# Patient Record
Sex: Female | Born: 1945 | Race: Black or African American | Hispanic: No | Smoking: Former smoker
Health system: Southern US, Community
[De-identification: ages and names within clinical notes are randomized; demographics above are authoritative.]

## PROBLEM LIST (undated history)

## (undated) DIAGNOSIS — I5042 Chronic combined systolic (congestive) and diastolic (congestive) heart failure: Secondary | ICD-10-CM

## (undated) DIAGNOSIS — C50919 Malignant neoplasm of unspecified site of unspecified female breast: Secondary | ICD-10-CM

## (undated) DIAGNOSIS — I5032 Chronic diastolic (congestive) heart failure: Secondary | ICD-10-CM

## (undated) DIAGNOSIS — I428 Other cardiomyopathies: Secondary | ICD-10-CM

## (undated) DIAGNOSIS — I2510 Atherosclerotic heart disease of native coronary artery without angina pectoris: Secondary | ICD-10-CM

## (undated) HISTORY — DX: Chronic diastolic (congestive) heart failure: I50.32

## (undated) HISTORY — PX: TRANSTHORACIC ECHOCARDIOGRAM: SHX275

## (undated) HISTORY — DX: Malignant neoplasm of unspecified site of unspecified female breast: C50.919

## (undated) HISTORY — DX: Atherosclerotic heart disease of native coronary artery without angina pectoris: I25.10

## (undated) HISTORY — DX: Other cardiomyopathies: I42.8

## (undated) HISTORY — PX: APPENDECTOMY: SHX54

## (undated) HISTORY — PX: BREAST LUMPECTOMY: SHX2

## (undated) HISTORY — DX: Chronic combined systolic (congestive) and diastolic (congestive) heart failure: I50.42

---

## 1997-06-01 ENCOUNTER — Encounter: Admission: RE | Admit: 1997-06-01 | Discharge: 1997-08-30 | Payer: Self-pay | Admitting: Cardiology

## 2000-03-05 ENCOUNTER — Encounter: Payer: Self-pay | Admitting: Cardiology

## 2000-03-05 ENCOUNTER — Encounter: Admission: RE | Admit: 2000-03-05 | Discharge: 2000-03-05 | Payer: Self-pay | Admitting: Cardiology

## 2000-03-06 ENCOUNTER — Encounter: Payer: Self-pay | Admitting: Cardiology

## 2000-03-06 ENCOUNTER — Encounter: Admission: RE | Admit: 2000-03-06 | Discharge: 2000-03-06 | Payer: Self-pay | Admitting: Cardiology

## 2000-03-07 ENCOUNTER — Other Ambulatory Visit: Admission: RE | Admit: 2000-03-07 | Discharge: 2000-03-07 | Payer: Self-pay | Admitting: General Surgery

## 2000-03-15 ENCOUNTER — Encounter: Admission: RE | Admit: 2000-03-15 | Discharge: 2000-03-15 | Payer: Self-pay | Admitting: General Surgery

## 2000-03-15 ENCOUNTER — Other Ambulatory Visit: Admission: RE | Admit: 2000-03-15 | Discharge: 2000-03-15 | Payer: Self-pay | Admitting: General Surgery

## 2000-03-15 ENCOUNTER — Encounter (HOSPITAL_BASED_OUTPATIENT_CLINIC_OR_DEPARTMENT_OTHER): Payer: Self-pay | Admitting: General Surgery

## 2000-03-29 ENCOUNTER — Ambulatory Visit (HOSPITAL_COMMUNITY): Admission: RE | Admit: 2000-03-29 | Discharge: 2000-03-29 | Payer: Self-pay | Admitting: General Surgery

## 2000-03-29 ENCOUNTER — Encounter (HOSPITAL_BASED_OUTPATIENT_CLINIC_OR_DEPARTMENT_OTHER): Payer: Self-pay | Admitting: General Surgery

## 2000-03-29 ENCOUNTER — Encounter (INDEPENDENT_AMBULATORY_CARE_PROVIDER_SITE_OTHER): Payer: Self-pay | Admitting: *Deleted

## 2000-04-08 ENCOUNTER — Emergency Department (HOSPITAL_COMMUNITY): Admission: EM | Admit: 2000-04-08 | Discharge: 2000-04-08 | Payer: Self-pay | Admitting: Emergency Medicine

## 2000-04-27 ENCOUNTER — Encounter: Admission: RE | Admit: 2000-04-27 | Discharge: 2000-07-26 | Payer: Self-pay | Admitting: *Deleted

## 2000-05-15 ENCOUNTER — Encounter (HOSPITAL_BASED_OUTPATIENT_CLINIC_OR_DEPARTMENT_OTHER): Payer: Self-pay | Admitting: General Surgery

## 2000-05-15 ENCOUNTER — Encounter: Admission: RE | Admit: 2000-05-15 | Discharge: 2000-05-15 | Payer: Self-pay | Admitting: General Surgery

## 2000-05-24 ENCOUNTER — Encounter (HOSPITAL_BASED_OUTPATIENT_CLINIC_OR_DEPARTMENT_OTHER): Payer: Self-pay | Admitting: General Surgery

## 2000-05-24 ENCOUNTER — Encounter (INDEPENDENT_AMBULATORY_CARE_PROVIDER_SITE_OTHER): Payer: Self-pay | Admitting: *Deleted

## 2000-05-24 ENCOUNTER — Ambulatory Visit (HOSPITAL_COMMUNITY): Admission: RE | Admit: 2000-05-24 | Discharge: 2000-05-24 | Payer: Self-pay | Admitting: General Surgery

## 2000-06-15 ENCOUNTER — Ambulatory Visit (HOSPITAL_COMMUNITY): Admission: RE | Admit: 2000-06-15 | Discharge: 2000-06-15 | Payer: Self-pay | Admitting: *Deleted

## 2000-06-15 ENCOUNTER — Encounter: Payer: Self-pay | Admitting: *Deleted

## 2000-06-26 ENCOUNTER — Encounter (HOSPITAL_BASED_OUTPATIENT_CLINIC_OR_DEPARTMENT_OTHER): Payer: Self-pay | Admitting: General Surgery

## 2000-06-26 ENCOUNTER — Ambulatory Visit (HOSPITAL_COMMUNITY): Admission: RE | Admit: 2000-06-26 | Discharge: 2000-06-26 | Payer: Self-pay | Admitting: General Surgery

## 2000-10-15 ENCOUNTER — Ambulatory Visit: Admission: RE | Admit: 2000-10-15 | Discharge: 2001-01-13 | Payer: Self-pay | Admitting: *Deleted

## 2000-10-23 ENCOUNTER — Encounter: Admission: RE | Admit: 2000-10-23 | Discharge: 2000-10-23 | Payer: Self-pay | Admitting: *Deleted

## 2001-02-08 ENCOUNTER — Ambulatory Visit (HOSPITAL_COMMUNITY): Admission: RE | Admit: 2001-02-08 | Discharge: 2001-02-08 | Payer: Self-pay | Admitting: General Surgery

## 2001-03-18 ENCOUNTER — Encounter: Admission: RE | Admit: 2001-03-18 | Discharge: 2001-03-18 | Payer: Self-pay | Admitting: Oncology

## 2001-03-18 ENCOUNTER — Encounter: Payer: Self-pay | Admitting: Oncology

## 2001-05-28 ENCOUNTER — Other Ambulatory Visit: Admission: RE | Admit: 2001-05-28 | Discharge: 2001-05-28 | Payer: Self-pay | Admitting: Obstetrics and Gynecology

## 2001-06-05 ENCOUNTER — Encounter: Admission: RE | Admit: 2001-06-05 | Discharge: 2001-06-05 | Payer: Self-pay | Admitting: Obstetrics and Gynecology

## 2001-06-05 ENCOUNTER — Encounter: Payer: Self-pay | Admitting: Obstetrics and Gynecology

## 2001-10-15 ENCOUNTER — Encounter: Payer: Self-pay | Admitting: Oncology

## 2001-10-15 ENCOUNTER — Encounter: Admission: RE | Admit: 2001-10-15 | Discharge: 2001-10-15 | Payer: Self-pay | Admitting: Oncology

## 2002-03-22 ENCOUNTER — Encounter: Payer: Self-pay | Admitting: Orthopedic Surgery

## 2002-03-22 ENCOUNTER — Encounter: Payer: Self-pay | Admitting: *Deleted

## 2002-03-22 ENCOUNTER — Emergency Department (HOSPITAL_COMMUNITY): Admission: EM | Admit: 2002-03-22 | Discharge: 2002-03-22 | Payer: Self-pay | Admitting: Emergency Medicine

## 2002-09-09 ENCOUNTER — Ambulatory Visit (HOSPITAL_COMMUNITY): Admission: RE | Admit: 2002-09-09 | Discharge: 2002-09-09 | Payer: Self-pay | Admitting: Oncology

## 2002-09-09 ENCOUNTER — Encounter: Payer: Self-pay | Admitting: Oncology

## 2002-09-16 ENCOUNTER — Encounter: Admission: RE | Admit: 2002-09-16 | Discharge: 2002-09-16 | Payer: Self-pay | Admitting: Oncology

## 2002-09-16 ENCOUNTER — Encounter: Payer: Self-pay | Admitting: Oncology

## 2003-05-22 ENCOUNTER — Emergency Department (HOSPITAL_COMMUNITY): Admission: EM | Admit: 2003-05-22 | Discharge: 2003-05-22 | Payer: Self-pay | Admitting: Emergency Medicine

## 2003-10-06 ENCOUNTER — Encounter: Admission: RE | Admit: 2003-10-06 | Discharge: 2003-10-06 | Payer: Self-pay | Admitting: Oncology

## 2003-10-11 ENCOUNTER — Emergency Department (HOSPITAL_COMMUNITY): Admission: EM | Admit: 2003-10-11 | Discharge: 2003-10-11 | Payer: Self-pay | Admitting: Emergency Medicine

## 2004-01-24 ENCOUNTER — Ambulatory Visit: Payer: Self-pay | Admitting: Oncology

## 2004-08-09 ENCOUNTER — Ambulatory Visit: Payer: Self-pay | Admitting: Oncology

## 2004-11-15 ENCOUNTER — Ambulatory Visit: Payer: Self-pay | Admitting: Oncology

## 2004-11-16 ENCOUNTER — Encounter: Admission: RE | Admit: 2004-11-16 | Discharge: 2004-11-16 | Payer: Self-pay | Admitting: Oncology

## 2005-02-27 ENCOUNTER — Ambulatory Visit: Payer: Self-pay | Admitting: Oncology

## 2005-08-01 ENCOUNTER — Observation Stay (HOSPITAL_COMMUNITY): Admission: EM | Admit: 2005-08-01 | Discharge: 2005-08-02 | Payer: Self-pay | Admitting: Internal Medicine

## 2005-09-05 ENCOUNTER — Ambulatory Visit: Payer: Self-pay | Admitting: Oncology

## 2005-11-01 ENCOUNTER — Ambulatory Visit: Payer: Self-pay | Admitting: Oncology

## 2005-11-03 LAB — COMPREHENSIVE METABOLIC PANEL
AST: 12 U/L (ref 0–37)
Albumin: 3.7 g/dL (ref 3.5–5.2)
BUN: 11 mg/dL (ref 6–23)
Calcium: 9.2 mg/dL (ref 8.4–10.5)
Chloride: 108 mEq/L (ref 96–112)
Creatinine, Ser: 0.69 mg/dL (ref 0.40–1.20)
Glucose, Bld: 171 mg/dL — ABNORMAL HIGH (ref 70–99)
Potassium: 4.2 mEq/L (ref 3.5–5.3)

## 2005-11-03 LAB — CBC WITH DIFFERENTIAL/PLATELET
Basophils Absolute: 0.1 10*3/uL (ref 0.0–0.1)
EOS%: 1.6 % (ref 0.0–7.0)
Eosinophils Absolute: 0.2 10*3/uL (ref 0.0–0.5)
HCT: 40.6 % (ref 34.8–46.6)
HGB: 14.1 g/dL (ref 11.6–15.9)
MCH: 32.8 pg (ref 26.0–34.0)
MCV: 94.3 fL (ref 81.0–101.0)
NEUT#: 5.8 10*3/uL (ref 1.5–6.5)
NEUT%: 53.7 % (ref 39.6–76.8)
RDW: 13.5 % (ref 11.3–14.5)
lymph#: 3.9 10*3/uL — ABNORMAL HIGH (ref 0.9–3.3)

## 2005-11-21 ENCOUNTER — Encounter: Admission: RE | Admit: 2005-11-21 | Discharge: 2005-11-21 | Payer: Self-pay | Admitting: Oncology

## 2006-05-01 ENCOUNTER — Ambulatory Visit: Payer: Self-pay | Admitting: Oncology

## 2006-07-20 ENCOUNTER — Ambulatory Visit: Payer: Self-pay | Admitting: Oncology

## 2006-07-24 LAB — CBC WITH DIFFERENTIAL/PLATELET
Basophils Absolute: 0 10*3/uL (ref 0.0–0.1)
EOS%: 3.1 % (ref 0.0–7.0)
Eosinophils Absolute: 0.3 10*3/uL (ref 0.0–0.5)
HCT: 39.6 % (ref 34.8–46.6)
HGB: 14.1 g/dL (ref 11.6–15.9)
MONO#: 0.8 10*3/uL (ref 0.1–0.9)
NEUT#: 4.7 10*3/uL (ref 1.5–6.5)
NEUT%: 52.5 % (ref 39.6–76.8)
RDW: 13.6 % (ref 11.3–14.5)
WBC: 8.9 10*3/uL (ref 3.9–10.0)
lymph#: 3.1 10*3/uL (ref 0.9–3.3)

## 2006-07-24 LAB — COMPREHENSIVE METABOLIC PANEL
AST: 12 U/L (ref 0–37)
Albumin: 3.7 g/dL (ref 3.5–5.2)
BUN: 13 mg/dL (ref 6–23)
CO2: 23 mEq/L (ref 19–32)
Calcium: 9.3 mg/dL (ref 8.4–10.5)
Chloride: 107 mEq/L (ref 96–112)
Creatinine, Ser: 0.67 mg/dL (ref 0.40–1.20)
Glucose, Bld: 243 mg/dL — ABNORMAL HIGH (ref 70–99)
Potassium: 4.3 mEq/L (ref 3.5–5.3)

## 2006-10-26 ENCOUNTER — Ambulatory Visit (HOSPITAL_BASED_OUTPATIENT_CLINIC_OR_DEPARTMENT_OTHER): Admission: RE | Admit: 2006-10-26 | Discharge: 2006-10-26 | Payer: Self-pay | Admitting: Orthopedic Surgery

## 2006-11-19 ENCOUNTER — Ambulatory Visit: Payer: Self-pay | Admitting: Oncology

## 2006-11-21 LAB — CBC WITH DIFFERENTIAL/PLATELET
BASO%: 0.5 % (ref 0.0–2.0)
Basophils Absolute: 0 10*3/uL (ref 0.0–0.1)
EOS%: 2.5 % (ref 0.0–7.0)
HGB: 13.7 g/dL (ref 11.6–15.9)
MCH: 33.6 pg (ref 26.0–34.0)
MONO#: 0.7 10*3/uL (ref 0.1–0.9)
RDW: 13.6 % (ref 11.3–14.5)
WBC: 8.1 10*3/uL (ref 3.9–10.0)
lymph#: 3.3 10*3/uL (ref 0.9–3.3)

## 2006-11-21 LAB — COMPREHENSIVE METABOLIC PANEL
ALT: 15 U/L (ref 0–35)
AST: 15 U/L (ref 0–37)
Chloride: 103 mEq/L (ref 96–112)
Creatinine, Ser: 0.72 mg/dL (ref 0.40–1.20)
Total Bilirubin: 0.5 mg/dL (ref 0.3–1.2)

## 2006-12-06 ENCOUNTER — Encounter: Admission: RE | Admit: 2006-12-06 | Discharge: 2006-12-06 | Payer: Self-pay | Admitting: Oncology

## 2007-04-15 ENCOUNTER — Ambulatory Visit: Payer: Self-pay | Admitting: Oncology

## 2007-05-31 ENCOUNTER — Ambulatory Visit: Payer: Self-pay | Admitting: Oncology

## 2007-05-31 LAB — CBC WITH DIFFERENTIAL/PLATELET
Basophils Absolute: 0.1 10*3/uL (ref 0.0–0.1)
EOS%: 4.2 % (ref 0.0–7.0)
HCT: 40.9 % (ref 34.8–46.6)
HGB: 14.1 g/dL (ref 11.6–15.9)
LYMPH%: 40.4 % (ref 14.0–48.0)
MCH: 32.7 pg (ref 26.0–34.0)
MONO#: 0.7 10*3/uL (ref 0.1–0.9)
NEUT%: 45.1 % (ref 39.6–76.8)
Platelets: 266 10*3/uL (ref 145–400)
lymph#: 3 10*3/uL (ref 0.9–3.3)

## 2007-05-31 LAB — COMPREHENSIVE METABOLIC PANEL
AST: 11 U/L (ref 0–37)
Alkaline Phosphatase: 89 U/L (ref 39–117)
BUN: 14 mg/dL (ref 6–23)
Calcium: 9.3 mg/dL (ref 8.4–10.5)
Creatinine, Ser: 0.71 mg/dL (ref 0.40–1.20)
Glucose, Bld: 188 mg/dL — ABNORMAL HIGH (ref 70–99)

## 2008-01-13 ENCOUNTER — Ambulatory Visit: Payer: Self-pay | Admitting: Oncology

## 2008-07-05 ENCOUNTER — Encounter (INDEPENDENT_AMBULATORY_CARE_PROVIDER_SITE_OTHER): Payer: Self-pay | Admitting: Internal Medicine

## 2008-07-05 ENCOUNTER — Ambulatory Visit: Payer: Self-pay | Admitting: Cardiology

## 2008-07-05 ENCOUNTER — Inpatient Hospital Stay (HOSPITAL_COMMUNITY): Admission: EM | Admit: 2008-07-05 | Discharge: 2008-07-08 | Payer: Self-pay | Admitting: Emergency Medicine

## 2008-07-09 ENCOUNTER — Telehealth: Payer: Self-pay | Admitting: Cardiology

## 2008-07-10 ENCOUNTER — Ambulatory Visit: Payer: Self-pay | Admitting: Cardiology

## 2008-07-16 DIAGNOSIS — I5043 Acute on chronic combined systolic (congestive) and diastolic (congestive) heart failure: Secondary | ICD-10-CM | POA: Insufficient documentation

## 2008-07-16 DIAGNOSIS — I10 Essential (primary) hypertension: Secondary | ICD-10-CM | POA: Insufficient documentation

## 2008-07-16 DIAGNOSIS — E1169 Type 2 diabetes mellitus with other specified complication: Secondary | ICD-10-CM | POA: Insufficient documentation

## 2008-07-16 DIAGNOSIS — Z794 Long term (current) use of insulin: Secondary | ICD-10-CM

## 2008-07-16 DIAGNOSIS — E119 Type 2 diabetes mellitus without complications: Secondary | ICD-10-CM

## 2008-07-16 DIAGNOSIS — I2510 Atherosclerotic heart disease of native coronary artery without angina pectoris: Secondary | ICD-10-CM | POA: Insufficient documentation

## 2008-08-05 ENCOUNTER — Ambulatory Visit: Payer: Self-pay | Admitting: Cardiology

## 2008-08-05 DIAGNOSIS — R002 Palpitations: Secondary | ICD-10-CM

## 2008-08-05 DIAGNOSIS — J984 Other disorders of lung: Secondary | ICD-10-CM | POA: Insufficient documentation

## 2008-08-18 ENCOUNTER — Telehealth (INDEPENDENT_AMBULATORY_CARE_PROVIDER_SITE_OTHER): Payer: Self-pay | Admitting: *Deleted

## 2008-11-25 ENCOUNTER — Encounter (INDEPENDENT_AMBULATORY_CARE_PROVIDER_SITE_OTHER): Payer: Self-pay | Admitting: *Deleted

## 2008-12-28 ENCOUNTER — Encounter: Admission: RE | Admit: 2008-12-28 | Discharge: 2008-12-28 | Payer: Self-pay | Admitting: Oncology

## 2009-01-08 ENCOUNTER — Encounter: Payer: Self-pay | Admitting: Cardiology

## 2009-01-19 ENCOUNTER — Telehealth: Payer: Self-pay | Admitting: Cardiology

## 2009-01-29 ENCOUNTER — Ambulatory Visit: Payer: Self-pay | Admitting: Oncology

## 2009-12-28 ENCOUNTER — Observation Stay (HOSPITAL_COMMUNITY)
Admission: EM | Admit: 2009-12-28 | Discharge: 2009-12-29 | Payer: Self-pay | Source: Home / Self Care | Admitting: Emergency Medicine

## 2009-12-29 ENCOUNTER — Encounter (INDEPENDENT_AMBULATORY_CARE_PROVIDER_SITE_OTHER): Payer: Self-pay | Admitting: Diagnostic Radiology

## 2010-03-13 ENCOUNTER — Encounter: Payer: Self-pay | Admitting: Cardiology

## 2010-03-13 ENCOUNTER — Encounter: Payer: Self-pay | Admitting: Oncology

## 2010-05-03 LAB — BRAIN NATRIURETIC PEPTIDE: Pro B Natriuretic peptide (BNP): 400 pg/mL — ABNORMAL HIGH (ref 0.0–100.0)

## 2010-05-03 LAB — GLUCOSE, CAPILLARY

## 2010-05-03 LAB — CARDIAC PANEL(CRET KIN+CKTOT+MB+TROPI)
CK, MB: 2.9 ng/mL (ref 0.3–4.0)
Total CK: 103 U/L (ref 7–177)
Troponin I: 0.04 ng/mL (ref 0.00–0.06)

## 2010-05-03 LAB — DIFFERENTIAL
Basophils Absolute: 0 10*3/uL (ref 0.0–0.1)
Lymphocytes Relative: 29 % (ref 12–46)
Neutro Abs: 4.6 10*3/uL (ref 1.7–7.7)
Neutrophils Relative %: 59 % (ref 43–77)

## 2010-05-03 LAB — CBC
Platelets: 211 10*3/uL (ref 150–400)
RBC: 4 MIL/uL (ref 3.87–5.11)
RDW: 13.1 % (ref 11.5–15.5)
WBC: 7.8 10*3/uL (ref 4.0–10.5)

## 2010-05-03 LAB — HEMOGLOBIN A1C
Hgb A1c MFr Bld: 8.1 % — ABNORMAL HIGH (ref <5.7)
Mean Plasma Glucose: 186 mg/dL — ABNORMAL HIGH (ref <117)

## 2010-05-03 LAB — POCT I-STAT, CHEM 8
BUN: 11 mg/dL (ref 6–23)
Chloride: 105 mEq/L (ref 96–112)
Potassium: 3.9 mEq/L (ref 3.5–5.1)
Sodium: 139 mEq/L (ref 135–145)
TCO2: 24 mmol/L (ref 0–100)

## 2010-05-03 LAB — BASIC METABOLIC PANEL
CO2: 29 mEq/L (ref 19–32)
Calcium: 9 mg/dL (ref 8.4–10.5)
Chloride: 104 mEq/L (ref 96–112)
GFR calc Af Amer: >60 mL/min (ref >60)
Sodium: 140 mEq/L (ref 135–145)

## 2010-05-03 LAB — POCT CARDIAC MARKERS
CKMB, poc: 1.6 ng/mL (ref 1.0–8.0)
Myoglobin, poc: 51 ng/mL (ref 12–200)
Troponin i, poc: <0.05 ng/mL (ref 0.00–0.09)

## 2010-05-31 LAB — URINE DRUGS OF ABUSE SCREEN W ALC, ROUTINE (REF LAB)
Benzodiazepines.: NEGATIVE
Cocaine Metabolites: NEGATIVE
Creatinine,U: 16.4 mg/dL
Ethyl Alcohol: <10 mg/dL (ref <10)
Phencyclidine (PCP): NEGATIVE

## 2010-05-31 LAB — POCT I-STAT 3, ART BLOOD GAS (G3+)
Acid-Base Excess: 1 mmol/L (ref 0.0–2.0)
Bicarbonate: 26.4 mEq/L — ABNORMAL HIGH (ref 20.0–24.0)
O2 Saturation: 92 %
pO2, Arterial: 64 mmHg — ABNORMAL LOW (ref 80.0–100.0)

## 2010-05-31 LAB — DIFFERENTIAL
Basophils Absolute: 0.1 10*3/uL (ref 0.0–0.1)
Basophils Relative: 1 % (ref 0–1)
Eosinophils Absolute: 0.1 10*3/uL (ref 0.0–0.7)
Eosinophils Relative: 1 % (ref 0–5)
Monocytes Absolute: 0.6 10*3/uL (ref 0.1–1.0)

## 2010-05-31 LAB — CBC
HCT: 35.9 % — ABNORMAL LOW (ref 36.0–46.0)
HCT: 39.6 % (ref 36.0–46.0)
Hemoglobin: 13.8 g/dL (ref 12.0–15.0)
MCHC: 34.4 g/dL (ref 30.0–36.0)
MCHC: 34.8 g/dL (ref 30.0–36.0)
MCHC: 35 g/dL (ref 30.0–36.0)
MCV: 96.7 fL (ref 78.0–100.0)
Platelets: 202 10*3/uL (ref 150–400)
Platelets: 254 10*3/uL (ref 150–400)
RBC: 4.04 MIL/uL (ref 3.87–5.11)
RDW: 13.4 % (ref 11.5–15.5)
RDW: 13.5 % (ref 11.5–15.5)
WBC: 11.8 10*3/uL — ABNORMAL HIGH (ref 4.0–10.5)

## 2010-05-31 LAB — LIPID PANEL
Cholesterol: 158 mg/dL (ref 0–200)
HDL: 52 mg/dL (ref >39)
LDL Cholesterol: 87 mg/dL (ref 0–99)
Total CHOL/HDL Ratio: 3 RATIO

## 2010-05-31 LAB — POCT I-STAT 3, VENOUS BLOOD GAS (G3P V)
Acid-Base Excess: 3 mmol/L — ABNORMAL HIGH (ref 0.0–2.0)
Bicarbonate: 27.8 mEq/L — ABNORMAL HIGH (ref 20.0–24.0)
Bicarbonate: 27.9 mEq/L — ABNORMAL HIGH (ref 20.0–24.0)
O2 Saturation: 65 %
O2 Saturation: 73 %
TCO2: 29 mmol/L (ref 0–100)
pCO2, Ven: 44.2 mmHg — ABNORMAL LOW (ref 45.0–50.0)
pCO2, Ven: 44.4 mmHg — ABNORMAL LOW (ref 45.0–50.0)
pH, Ven: 7.405 — ABNORMAL HIGH (ref 7.250–7.300)
pO2, Ven: 39 mmHg (ref 30.0–45.0)

## 2010-05-31 LAB — COMPREHENSIVE METABOLIC PANEL
ALT: 101 U/L — ABNORMAL HIGH (ref 0–35)
AST: 52 U/L — ABNORMAL HIGH (ref 0–37)
Albumin: 3.4 g/dL — ABNORMAL LOW (ref 3.5–5.2)
CO2: 30 mEq/L (ref 19–32)
Calcium: 9.8 mg/dL (ref 8.4–10.5)
GFR calc Af Amer: >60 mL/min (ref >60)
GFR calc non Af Amer: >60 mL/min (ref >60)
Sodium: 138 mEq/L (ref 135–145)
Total Protein: 6.1 g/dL (ref 6.0–8.3)

## 2010-05-31 LAB — GLUCOSE, CAPILLARY
Glucose-Capillary: 114 mg/dL — ABNORMAL HIGH (ref 70–99)
Glucose-Capillary: 162 mg/dL — ABNORMAL HIGH (ref 70–99)
Glucose-Capillary: 207 mg/dL — ABNORMAL HIGH (ref 70–99)
Glucose-Capillary: 210 mg/dL — ABNORMAL HIGH (ref 70–99)
Glucose-Capillary: 261 mg/dL — ABNORMAL HIGH (ref 70–99)
Glucose-Capillary: 73 mg/dL (ref 70–99)
Glucose-Capillary: 95 mg/dL (ref 70–99)

## 2010-05-31 LAB — BASIC METABOLIC PANEL
BUN: 14 mg/dL (ref 6–23)
BUN: 7 mg/dL (ref 6–23)
CO2: 27 mEq/L (ref 19–32)
Chloride: 104 mEq/L (ref 96–112)
Chloride: 106 mEq/L (ref 96–112)
Creatinine, Ser: 0.74 mg/dL (ref 0.4–1.2)
Creatinine, Ser: 0.91 mg/dL (ref 0.4–1.2)
GFR calc Af Amer: >60 mL/min (ref >60)
GFR calc non Af Amer: >60 mL/min (ref >60)
Glucose, Bld: 251 mg/dL — ABNORMAL HIGH (ref 70–99)
Potassium: 4 mEq/L (ref 3.5–5.1)

## 2010-05-31 LAB — HEPARIN LEVEL (UNFRACTIONATED)
Heparin Unfractionated: 0.51 IU/mL (ref 0.30–0.70)
Heparin Unfractionated: 0.86 IU/mL — ABNORMAL HIGH (ref 0.30–0.70)

## 2010-05-31 LAB — BRAIN NATRIURETIC PEPTIDE: Pro B Natriuretic peptide (BNP): 702 pg/mL — ABNORMAL HIGH (ref 0.0–100.0)

## 2010-05-31 LAB — CARDIAC PANEL(CRET KIN+CKTOT+MB+TROPI)
CK, MB: 3.1 ng/mL (ref 0.3–4.0)
Relative Index: INVALID (ref 0.0–2.5)
Total CK: 90 U/L (ref 7–177)
Troponin I: 0.07 ng/mL — ABNORMAL HIGH (ref 0.00–0.06)

## 2010-05-31 LAB — HEMOGLOBIN A1C: Mean Plasma Glucose: 151 mg/dL

## 2010-05-31 LAB — PROTIME-INR: Prothrombin Time: 15.3 seconds — ABNORMAL HIGH (ref 11.6–15.2)

## 2010-07-05 NOTE — Op Note (Signed)
NAMEQUINLEY, NESLER                  ACCOUNT NO.:  [id number]   MEDICAL RECORD NO.:  [id number]          PATIENT TYPE:  AMB   LOCATION:  DSC                          FACILITY:  [facility]   PHYSICIAN:  Dyke Brackett, M.D.    DATE OF BIRTH:  1945-07-09   DATE OF PROCEDURE:  DATE OF DISCHARGE:                               OPERATIVE REPORT   INDICATION:  A 65 year old right-hand dominant, recurrent trigger,  painful trigger thumb thought to be amenable to outpatient surgery.   PREOPERATIVE DIAGNOSIS:  Right trigger thumb.   POSTOPERATIVE DIAGNOSIS:  Right trigger thumb.   OPERATION:  Release angular pulley, right thumb.   SURGEON:  Dyke Brackett, M.D.   Monitored anesthesia care with local.   TOURNIQUET TIME:  Approximately 15 minutes.   DESCRIPTION OF PROCEDURE:  Sterile prep and draped, exsanguination of  the hand with a forearm tourniquet inflated 250.  Incision was made over  the thumb crease.  Dissection carried down proximally over the A1  pulley.  Careful protection of both digital nerves was done.  A very  stenosed severe flexor pollicis was decompressed.  There were no other  anomalies noted.  The wound was irrigated and closed with 4-0 nylon and  infiltrated with Marcaine and lidocaine.  A bulky dressing was applied,  taken to recovery room in stable condition.      Dyke Brackett, M.D.  Electronically Signed     WDC/MEDQ  D:  10/26/2006  T:  10/26/2006  Job:  [id number]

## 2010-07-05 NOTE — Consult Note (Signed)
[name], [name]                  ACCOUNT NO.:  [id number]   MEDICAL RECORD NO.:  [id number]          PATIENT TYPE:  INP   LOCATION:  4705                         FACILITY:  [facility]   PHYSICIAN:  Marca Ancona, MD      DATE OF BIRTH:  [birth year]   DATE OF CONSULTATION:  07/05/2008  DATE OF DISCHARGE:                                 CONSULTATION   PRIMARY CARE PHYSICIAN:  Della Goo, MD   HISTORY OF PRESENT ILLNESS:  This is a 65 year old with a history of  breast cancer status post lumpectomy, radiation, and chemotherapy,  diabetes, smoking, and prior DVT who presented to the emergency  department with acute shortness of breath.  The patient was in her usual  state of health yesterday, in general she does quite well.  She is very  active.  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  She does not have any history of chest pain.  Last night,  she woke up from sleep, it felt like her heart was racing.  She was  severely short of breath and was unable to lie flat due to the shortness  of breath.  She did call EMS and went to the hospital where she was  thought to be in acute pulmonary edema.  She was given nitroglycerin,  Lasix, and eventually put on BiPAP briefly, this improved her symptoms  considerably.  This morning we did see her, she does feel much better,  she is no longer significantly short of breath.  She is lying in bed,  breathing comfortably with nasal cannula.  The patient did have no chest  pain with this event.  The blood pressure when the patient first arrived  was 140/85 and her heart rate was 111 with sinus rhythm.   PAST MEDICAL HISTORY:  1. Breast cancer.  The patient is status post      lumpectomy/radiation/chemotherapy.  She did get tamoxifen until      2005.  2. Type 2 diabetes.  The patient is poorly compliant with her diet.  3. One pack per day smoker.  4. DVT.  The patient did have a DVT.  She says about 20 years ago,  she      said she took Coumadin for a period of time.   SOCIAL HISTORY:  The patient lives in [place] with her children.  She  is a Psychologist, counselling.  She does smoke a pack a day; she has  done so for many years.  She does not drink alcohol or use any illicit  drugs.   FAMILY HISTORY:  Father died due to complications of alcoholism, mother  is alive and healthy.   REVIEW OF SYSTEMS:  All systems were reviewed and were negative except  as noted in the history of present illness.   ALLERGIES:  The patient has no known drug allergies.   MEDICATIONS AT HOME:  Metformin and glipizide.   MEDICATIONS IN THE HOSPITAL:  1. Lasix 20 mg IV q.12 h.  2. Potassium chloride.  3. Albuterol.  4. Atrovent nebs.  5. Lovenox 40 mg daily.  6. Aspirin 81 mg daily.   EKG was reviewed shows a heart rate of 115 sinus tachycardia with  lateral T-wave inversions, this is the initial EKG in the emergency  department.  Chest x-ray shows minimal diffuse interstitial infiltrates  bilaterally, it can be consistent with pulmonary edema.   PHYSICAL EXAMINATION:  VITAL SIGNS:  Temperature 98.1, blood pressure  121/69, oxygen saturation 99% on 2 L nasal cannula, and weight 72 kg.  GENERAL:  This is a well-developed female in no apparent distress.  NEUROLOGIC:  Alert and oriented x3.  Normal affect.  HEENT:  Normal exam.  ABDOMEN:  Soft and nontender.  No hepatosplenomegaly.  Normal bowel  sounds.  NECK:  Supple without lymphadenopathy.  JVP is elevated at 10 cm of  water.  CARDIOVASCULAR:  Heart regular.  S1 and S2, no S3, no S4.  There is no  murmur.  There is no peripheral edema.  2+ posterior tibial pulses  bilaterally.  There is no carotid bruits.  There is no edema.  EXTREMITIES:  No clubbing or cyanosis.  LUNGS:  Crackles bilaterally about half the way up the lung fields.  MUSCULOSKELETAL:  Normal exam.  SKIN:  Normal exam.   ASSESSMENT AND PLAN:  This is a 65 year old with a history  of breast  cancer treated back in early 2000s as well as prior deep venous  thrombosis and smoker who presents with what appears to be acute  pulmonary edema.  1. Shortness of breath.  The patient did wake up last night with      symptoms that seemed like paroxysmal nocturnal dyspnea.  She was      quite short of breath, came to the emergency department, and was      found to be in acute pulmonary edema and did require BiPAP for      brief period of time.  She has been diuresed and given      nitroglycerin and does feel considerably better at this time.  The      cause of this apparent flash type pulmonary edema is uncertain.      She does not have whole lot of past medical history.  Chest x-ray      does show some sinus consistent with pulmonary edema.  Her BNP is      elevated at 330.  It is possible that this could have been an      ischemic event, though she had no chest pain and enzymes initially      are negative.  It is possible that this event could have been      triggered by arrhythmias and the patient felt that her heart was      racing when she awoke.  However, she still felt like her heart was      racing in the emergency department and her heart rate was only 111      and was in sinus rhythm.  At this time, we will plan on getting an      echocardiogram.  We will try to get that done today.  We will rule      her out for myocardial infarction with serial cardiac enzymes.  We      will put her on heparin drip for now.  I will apply nitroglycerin      paste 1 inch to her chest for preload and afterload reduction.  I  would also continue her on Lasix because she does still seem a bit      volume-overload.  We will increase the Lasix to 40 mg IV q.12 h.      and also we will do a CTA of her chest given her history of DVT,      although she does seem to have pulmonary edema, I think we do need      to rule out PE and finally if CT of her chest is negative, we will      plan  on right and left heart catheterization in the morning.  She      will be on telemetry and we will follow her looking for any      arrhythmias.  2. Tobacco abuse.  The patient was strongly encouraged to stop      smoking.  3. History of breast cancer.  The patient did receive chemotherapy;      however, this was in the early 2000s, and she does have no history      of chronic dyspnea on exertion to suggest a chemotherapy-induced      cardiomyopathy.      Marca Ancona, MD  Electronically Signed    DM/MEDQ  D:  07/05/2008  T:  07/06/2008  Job:  [id number]

## 2010-07-05 NOTE — H&P (Signed)
[name], [name]                  ACCOUNT NO.:  [id number]   MEDICAL RECORD NO.:  [id number]          PATIENT TYPE:  EMS   LOCATION:  MAJO                         FACILITY:  [facility]   PHYSICIAN:  Vania Rea, M.D. DATE OF BIRTH:  1945-09-19   DATE OF ADMISSION:  07/05/2008  DATE OF DISCHARGE:                              HISTORY & PHYSICAL   PRIMARY CARE PHYSICIAN:  Della Goo, M.D.   CHIEF COMPLAINT:  Difficulty breathing last night.   HISTORY OF PRESENT ILLNESS:  This is a 65 year old African American lady  with remote history of carcinoma of the breast, status post lumpectomy  and radiation therapy and chemotherapy who completed tamoxifen in 2005  and has since had no recurrence.  Also has history of diabetes and  denies any history of hypertension.  Considers herself to be in good  health until just on retiring for bed last night.  The patient developed  sudden onset of shortness of breath, diaphoresis and tachycardia.  She  denied any chest pain or any previous palpitations,.  EMS was called.  EMS said they found her diaphoretic and tachypneic and initially had to  be placed on BiPAP.  I have not seen the EMS records.  But the emergency  room physician informs me that her blood pressure was 210/110 when they  arrived, that she received subcutaneous layer nitroglycerin as well as  intravenous Lasix and improved significantly, such that by the time she  arrived at the hospital, her blood pressure was 140/85.  She no longer  required nonrebreathing mask and she was on 2 L of oxygen.   Currently the patient is comfortable.  She says she is back to her  baseline and does not feel short of breath.  She denies any illicit drug  use.  She denies use of any over-the-counter energy drinks or other over-  the-counter preparations.   PAST MEDICAL HISTORY:  As noted above.   MEDICATIONS:  1. Metformin 1000 mg twice daily.  2. Glipizide 10 mg twice daily.   ALLERGIES:  No  known drug allergies.   SOCIAL HISTORY:  She smokes one pack per day.  She denies tobacco,  alcohol or illicit drug use.  She  lives in her own home with her  children.  She is unemployed as a Catering manager.   FAMILY HISTORY:  Her family is remarkably healthy.  There is no history  of cardiac or pulmonary disease although her sister is hypotensive.   REVIEW OF SYSTEMS:  On a 10-point review of systems, other than noted  above, significant only for episodic anxiety.   PHYSICAL EXAMINATION:  GENERAL:  Pleasant, middle-aged African American  lady reclining on the stretcher in no acute distress, somewhat anxious.  VITAL SIGNS:  Temperature nontender elevated.  Pulse 101, blood pressure  119/64.  She is saturating 100% on 2 L.  She is in no pain.  HEENT:  Pupils are round, equal and reactive. Mucous membranes are pink  and anicteric.  NECK: She has no cervical lymphadenopathy or thyromegaly.  No carotid  bruits.  CHEST:  She has bibasilar crackles, left greater than right.  CARDIOVASCULAR:  Regular rhythm.  No murmurs are heard.  ABDOMEN:  Soft, nontender.  There are no masses.  EXTREMITIES:  Without edema.  She has 2+ pulses bilaterally.  CENTRAL NERVOUS SYSTEM:  Cranial nerves II-XII grossly intact.  No focal  neurologic deficits.   LABORATORY DATA:  White count is 11.8, hemoglobin 13.8, platelet count  263,000.  She has a normal differential.  Her serum chemistry is  remarkable only for blood glucose of 172; otherwise completely normal.  Her cardiac enzymes:  Total CK is normal.  Her troponin is 0.03.  Her B  natriuretic peptide is elevated to 330.  Her portable chest x-ray done  in the emergency room shows minimal diffuse interstitial infiltrate or  edema.  Her EKG on arrival in the emergency room shows sinus tachycardia  at 115.   ASSESSMENT:  1. Flash pulmonary edema of unclear etiology.  2. Hypertensive urgency resolved by history.  3. Remote history of breast  cancer.  4. Diabetes type 2.   PLAN:  Will admit this lady for further investigation.  Will get a 2-D  echo to check for congestive heart failure of her pericardial effusion.  Will continue to treat her with low dose intravenous Lasix for the time  being.  Will check her thyroid function.  Will go ahead and check a  urine drug screen and will continue to cycle cardiac enzymes.  Other  plans as per orders.      Vania Rea, M.D.  Electronically Signed     LC/MEDQ  D:  07/05/2008  T:  07/05/2008  Job:  [id number]   cc:   Della Goo, M.D.

## 2010-07-05 NOTE — Discharge Summary (Signed)
[name], [name]                  ACCOUNT NO.:  [id number]   MEDICAL RECORD NO.:  [id number]          PATIENT TYPE:  INP   LOCATION:  4705                         FACILITY:  [facility]   PHYSICIAN:  Charlestine Massed, MDDATE OF BIRTH:  Mar 01, 1945   DATE OF ADMISSION:  07/05/2008  DATE OF DISCHARGE:                               DISCHARGE SUMMARY   PRIMARY CARE PHYSICIAN:  Della Goo, MD   CARDIOLOGIST:  Marca Ancona, MD, [facility].   REASON FOR ADMISSION:  Shortness of breath.   DISCHARGE DIAGNOSIS:  1. Congestive heart failure with segmental hypokinesis of the left      ventricle.  2. Nonobstructive coronary artery disease.  3. Diabetes mellitus, stable.  4. Hypertension controlled.  5. Dyslipidemia on medications.  6. Remote history of breast cancer status post lumpectomy and      radiation.   DISCHARGE MEDICATIONS:  1. The existing medications to be continued are:      a.     Metformin 1000 mg p.o. b.i.d.      b.     Glyburide 10 mg p.o. b.i.d. before meals.  2. The new medications that are started are:      a.     Coreg 6.25 mg p.o. b.i.d.      b.     Lisinopril 2.5 mg p.o. daily.      c.     Zocor 20 mg p.o. daily.      d.     Aspirin 81 mg p.o. daily.      e.     NovoLog insulin coverage q.a.c./at bedtime as per [facility] sensitive scale protocol.   HOSPITAL COURSE:  1. Congestive heart failure with acute pulmonary edema and      hypertensive urgency.  The patient was admitted on Jul 05, 2008,      for shortness of breath, was found to be having acute pulmonary      edema secondary to hypertensive urgency.  Her blood pressure was      very high at the time of admission.  The patient was initially      admitted and was evaluated for hypertension and pulmonary edema.      On medications, her condition improved.  Her BNP was 502.  She had      a mildly elevated troponin going up to 0.11.  Cardiology consult      was called, seen by Dr.  Shirlee Latch of [facility].  A left heart      catheterization was done which showed nonobstructive coronary      artery disease, but there was segmental inferior wall hypokinesis      which could not be explained fully.  The only other possibility is      to look for either any vasculitis satisfied the component as the      reason for segmental wall hypokinesis, left ventricular wall      hypokinesis.  The patient to be managed medically.  No need for any      intervention,  so Cardiology is okay for the patient for discharge.      We will also place the patient on event monitor for 21 days after      the patient has been discharged from the hospital.  2. Hypertension, as mentioned above.  Continue the medications that      are mentioned above.  Further followup with Cardiology.  The      management of hypertension and heart failure go on a concurrent      mode.  In view of the fact that the patient has flash pulmonary      edema, it will be considered okay to evaluate this patient for any      renal artery stenosis.  3. Diabetes mellitus.  Currently, blood sugars are stable.  The      slightly elevated blood pressure are blood sugars are related to      the increased stress in this patient at this time.  No further      change in medications were done.  We will discharge the patient on      metformin and glyburide as she was before and add NovoLog insulin      coverage q.a.c. and at bedtime as moderate scale.  The patient to      follow up with Dr. Lovell Sheehan in 1 week for further blood sugar      control and also to check a BMET in 1 week.  4. Dyslipidemia.  Continue Zocor at 20 mg p.o. daily.   DISPOSITION:  Discharged back home.   FOLLOWUP:  1. Followup with Dr. Shirlee Latch on Jul 09, 2008.  Appointment has been      made.  2. Followup with Dr. Lovell Sheehan within 1 week.   Total of 40 minutes was spent on the discharge.      Charlestine Massed, MD  Electronically Signed      UT/MEDQ  D:  07/08/2008  T:  07/09/2008  Job:  [id number]   cc:   Della Goo, M.D.  Marca Ancona, MD

## 2010-07-08 NOTE — Op Note (Signed)
[place]  Patient:    [name], [name]                         MRN: [id number] Proc. Date: 03/29/00 Adm. Date:  [id number] Attending:  Sonda Primes                           Operative Report  PREOPERATIVE DIAGNOSIS:  Carcinoma of the left breast.  POSTOPERATIVE DIAGNOSIS:  Carcinoma of the left breast.  PROCEDURE:  Lumpectomy - left breast with left sentinel lymph node biopsy.  SURGEON:  Mardene Celeste. Lurene Shadow, M.D.  ASSISTANT:  Nurse.  ANESTHESIA:  General.  INDICATIONS:  Patient is a 65 year old woman presenting with a medially placed left breast mass at approximately the 10-11 oclock axis and about 1.5 fingerbreadths medial to the superior areolar margin.   She underwent image-guided biopsy, which showed invasive carcinoma.  She is brought to the operating room now following injection of radionucleotide for lumpectomy and for sentinel node biopsy and possible axillary lymph node dissection.  DESCRIPTION OF PROCEDURE:  Following the induction of anesthesia, the patient positioned supinely, and the left breast was prepped and draped to be included in a sterile operative field.  The mass was palpated and an elliptical incision was made around the mass after the periareolar skin had been infiltrated intradermally with Lymphazurin dye.  The dissection was carried down around the mass raising small flap superiorly, medially, laterally and inferiorly to get the wide margins around the tumor.  The dissection was carried down to the chest wall and the entire mass was removed and forwarded for pathologic evaluation.  Touch prep of the margins were negative for tumor. Attention was then turned to the axilla, where a transaxillary incision was made just at the border of the hairline, where the highest neoprobe counts were taken.  Dissection was carried down into the axilla, where two hot blue lymph nodes were dissected free and removed and  forwarded for pathologic evaluation.  Both lymph nodes on touch prep showed no evidence of metastatic carcinoma.  The remainder of the axilla showed no significant elevations above background.  Hemostasis was obtained in both wounds.  The breast wound was first closed in two layers after sponge, instrument and sharp counts were verified.  Subcutaneous tissues being closed with interrupted 3-0 Vicryl sutures and the skin closed with 5-0 Monocryl.  The axillary wound was similarly closed with interrupted 2-0 Vicryl sutures in the deep tissues and 5-0 Monocryl in the skin.  The wounds were reinforced with Steri-Strips, sterile dressings applied, the anesthetic reversed, and the patient removed from the operating room to the recovery room in stable condition having tolerated the procedure well. DD:  03/29/00 TD:  03/29/00 Job: [id number]

## 2010-07-08 NOTE — Consult Note (Signed)
   NAMESHAUNDREA, [name]                            ACCOUNT NO.:  [id number]   MEDICAL RECORD NO.:  [id number]                   PATIENT TYPE:  EMS   LOCATION:  ED                                   FACILITY:  [facility]   PHYSICIAN:  Mila Homer. Sherlean Foot, M.D.              DATE OF BIRTH:  01-22-1946   DATE OF CONSULTATION:  03/22/2002  DATE OF DISCHARGE:                                   CONSULTATION   CHIEF COMPLAINT:  Left wrist pain.   HISTORY OF PRESENT ILLNESS:  The patient is a 65 year old black female who  slipped on the ice a couple hours ago and was brought by her sister into the  emergency department.  She had no other medical problems, medications or  allergies.   PHYSICAL EXAMINATION:  VITAL SIGNS:  Afebrile, vital signs stable.  EXTREMITIES:  The left wrist had a dorsal cockup deformity and was painful.  It was neurovascularly intact and swelling was minimal.  Range of motion of  the wrist and fingers were normal.  There is no evidence of carpal tunnel  syndrome.   LABORATORY DATA:  X-rays show a 45-degree angulated, partially intra-  articular fracture.  Intra-articular portion was not displaced more than 1  mm.   IMPRESSION:  Displaced distal radius fracture.   TREATMENT:  After hematoma block, it was reduced and pushed back to its own  anatomic __________.  She was placed in a well-molded sugar-tong splint and  given Percocet for pain, instructions for ice and elevation for 48 hours and  to call my office for a followup appointment on  Tuesday.                                               Mila Homer. Sherlean Foot, M.D.    SDL/MEDQ  D:  03/22/2002  T:  03/22/2002  Job:  [id number]

## 2010-07-08 NOTE — Op Note (Signed)
[place]  Patient:    [name], [name]                         MRN: [id number] Proc. Date: 06/26/00 Adm. Date:  [id number] Attending:  Sonda Primes CC:         Two copies to Dr. Lurene Shadow   Operative Report  PREOPERATIVE DIAGNOSIS:  Poor venous access and carcinoma of the left breast.  POSTOPERATIVE DIAGNOSIS:  Poor venous access and carcinoma of the left breast.  OPERATION:  Implantation of a port-A-Cath to facilitate chemotherapy.  SURGEON:  Mardene Celeste. Lurene Shadow, M.D.  ASSISTANT:  Nurse  ANESTHESIA:  MAC, 1% xylocaine with epinephrine.  INDICATION FOR PROCEDURE:  This patient is a 65 year old woman with a diagnosed invasive carcinoma of the breast who is now being prepared for chemotherapy.  She is brought to the operating room for implantation of port-A-Cath.  DESCRIPTION OF PROCEDURE:  Following the induction of anesthesia, the patient was positioned in the Trendelenburg position and the anterior neck and chest were prepped and draped to be included in a sterile operative field.  The right subclavian region is infiltrated with 1% xylocaine with epinephrine.  A right sided subclavian stick is made into the subclavian vein and the guide wire threaded into the central venous system down into the superior vena cava under fluoroscopic guidance.  A pocket is then created on the anterior chest wall approximately four fingerbreadths below the shoulder site and a tunnel from this pocket is carried up to the shoulder and the silastic catheter pulled through to the shoulder.  I used a size 10 Cook introducer and dilator over the guide wire to access the venous system.  The guide wire and dilator were removed and the silastic catheter threaded in through the introducer and positioned at the atrial vena caval junction.  The external portion of the catheter was then injected with heparinized saline and in flow and blood return were noted to be  excellent.  The portion of the catheter in the pocket was then trimmed and the port-A-Cath reservoir securely attached to the silastic catheter.  In flow of heparinized saline and blood return through the reservoir was noted to be excellent.  The reservoir was then seated into the pocket and sutured in place with multiple 2-0 silk sutures.  Sponge, instruments and sharp counts were then verified.  Final evaluation under fluoroscopy showed the port-A-Cath to be well seated.  There are no kinks, bents or turns within the course of the catheter and the tip of the catheter was well positioned at the atrial vena caval junction.  Following correct sponge count, the wounds were closed as follows:  The pocket wound and the shoulder wound were closed with interrupted 3-0 Vicryl sutures in the subcutaneous tissues and a running 5-0 Monocryl on the skin.  The wound was reinforced with Steri-Strips.  The port-A-Cath devise was accessed with a Haber butterfly and flushed with concentrated heparinized saline.  Sterile dressings were applied.  The patient was then removed from the operating room to the recovery room in stable condition.  She tolerated the procedure well. DD:  06/26/00 TD:  06/26/00 Job: [id number]

## 2010-07-08 NOTE — Op Note (Signed)
[place]. [facility]  Patient:    [name], [name]                         MRN: [id number] Proc. Date: 05/24/00 Adm. Date:  [id number] Attending:  Sonda Primes                           Operative Report  PREOPERATIVE DIAGNOSIS:  Carcinoma of the left breast.  POSTOPERATIVE DIAGNOSIS:  Carcinoma of the left breast.  OPERATION PERFORMED:  Re-excision of left breast mass.  SURGEON:  Mardene Celeste. Lurene Shadow, M.D.  ASSISTANT:  Nurse.  ANESTHESIA:  General.  INDICATIONS FOR PROCEDURE:  The patient is a 65 year old woman with a core biopsy of the left breast showing carcinoma.  She underwent a previous excisional biopsy which showed no residual tumor within the breast.  She, however, on further evaluation was noted to have a tumor somewhere outside of the previous excision and it is felt that the original tumor was missed on the excisional biopsy.  She has returned now following needle localization for excision of this mass.  DESCRIPTION OF PROCEDURE:  Following the induction of satisfactory general anesthesia, the patient positioned supinely.  The left breast was prepped and draped to be included in a sterile operative field.  I made an elliptical incision around the previous incision site inclusive of the localizing needle. Deepened this through the subcutaneous tissues raising a flap superiorly over the lesion.  The entire lesion along with a large wedge of breast tissue at the 12 oclock axis was removed and forwarded for radiologic evaluation. Specimen radiography showed the lesion to be well contained within the specimen.  The specimen was then forwarded for pathologic evaluation.  It was felt then that the 12 oclock or superior margin of the lesion was rather close to the margin.  I then took an additional 2 cm margin in the 12 oclock axis and forwarded this for pathologic evaluation.  All areas of dissection were then checked for hemostasis and  noted to be dry.  Sponge, instrument and sharp counts were verified.  The wound was closed in layers as follows. Subcutaneous tissues closed with interrupted 3-0 Vicryl sutures.  Skin closed with a running 5-0 Monocryl suture and reinforced with Steri-Strips.  Sterile dressings applied.  Anesthetic reversed.  Patient removed from the operating room to the recovery room in stable condition having tolerated the procedure well. DD:  05/24/00 TD:  05/24/00 Job: [id number]

## 2010-12-02 LAB — BASIC METABOLIC PANEL
BUN: 7
CO2: 27
Chloride: 107
Creatinine, Ser: 0.76

## 2011-05-05 ENCOUNTER — Encounter (HOSPITAL_COMMUNITY): Payer: Self-pay | Admitting: *Deleted

## 2011-05-05 ENCOUNTER — Inpatient Hospital Stay (HOSPITAL_COMMUNITY)
Admission: EM | Admit: 2011-05-05 | Discharge: 2011-05-08 | DRG: 291 | Disposition: A | Discharge status: Home / Self Care | Payer: Medicare Other | Attending: Internal Medicine | Admitting: Internal Medicine

## 2011-05-05 ENCOUNTER — Other Ambulatory Visit: Payer: Self-pay

## 2011-05-05 ENCOUNTER — Emergency Department (HOSPITAL_COMMUNITY): Payer: Medicare Other

## 2011-05-05 DIAGNOSIS — K7689 Other specified diseases of liver: Secondary | ICD-10-CM | POA: Diagnosis present

## 2011-05-05 DIAGNOSIS — I509 Heart failure, unspecified: Secondary | ICD-10-CM | POA: Diagnosis present

## 2011-05-05 DIAGNOSIS — J441 Chronic obstructive pulmonary disease with (acute) exacerbation: Secondary | ICD-10-CM | POA: Diagnosis present

## 2011-05-05 DIAGNOSIS — E1169 Type 2 diabetes mellitus with other specified complication: Secondary | ICD-10-CM | POA: Diagnosis present

## 2011-05-05 DIAGNOSIS — IMO0001 Reserved for inherently not codable concepts without codable children: Secondary | ICD-10-CM | POA: Diagnosis present

## 2011-05-05 DIAGNOSIS — I10 Essential (primary) hypertension: Secondary | ICD-10-CM | POA: Diagnosis present

## 2011-05-05 DIAGNOSIS — E119 Type 2 diabetes mellitus without complications: Secondary | ICD-10-CM | POA: Diagnosis present

## 2011-05-05 DIAGNOSIS — I5043 Acute on chronic combined systolic (congestive) and diastolic (congestive) heart failure: Principal | ICD-10-CM | POA: Diagnosis present

## 2011-05-05 DIAGNOSIS — Z7982 Long term (current) use of aspirin: Secondary | ICD-10-CM

## 2011-05-05 DIAGNOSIS — I2510 Atherosclerotic heart disease of native coronary artery without angina pectoris: Secondary | ICD-10-CM | POA: Diagnosis present

## 2011-05-05 DIAGNOSIS — J9690 Respiratory failure, unspecified, unspecified whether with hypoxia or hypercapnia: Secondary | ICD-10-CM | POA: Diagnosis present

## 2011-05-05 DIAGNOSIS — Z79899 Other long term (current) drug therapy: Secondary | ICD-10-CM

## 2011-05-05 DIAGNOSIS — J9600 Acute respiratory failure, unspecified whether with hypoxia or hypercapnia: Secondary | ICD-10-CM | POA: Diagnosis present

## 2011-05-05 LAB — BASIC METABOLIC PANEL
CO2: 25 mEq/L (ref 19–32)
Chloride: 97 mEq/L (ref 96–112)
Creatinine, Ser: 0.71 mg/dL (ref 0.50–1.10)

## 2011-05-05 LAB — DIFFERENTIAL
Basophils Absolute: 0.1 10*3/uL (ref 0.0–0.1)
Lymphocytes Relative: 24 % (ref 12–46)
Monocytes Absolute: 0.8 10*3/uL (ref 0.1–1.0)
Neutro Abs: 7.5 10*3/uL (ref 1.7–7.7)

## 2011-05-05 LAB — CBC
HCT: 38.8 % (ref 36.0–46.0)
Hemoglobin: 13.7 g/dL (ref 12.0–15.0)
RBC: 4.24 MIL/uL (ref 3.87–5.11)
RDW: 13.4 % (ref 11.5–15.5)
WBC: 11 10*3/uL — ABNORMAL HIGH (ref 4.0–10.5)

## 2011-05-05 LAB — PRO B NATRIURETIC PEPTIDE: Pro B Natriuretic peptide (BNP): 891.8 pg/mL — ABNORMAL HIGH (ref 0–125)

## 2011-05-05 LAB — POCT I-STAT 3, ART BLOOD GAS (G3+)
Bicarbonate: 23.1 mEq/L (ref 20.0–24.0)
pH, Arterial: 7.388 (ref 7.350–7.400)
pO2, Arterial: 63 mmHg — ABNORMAL LOW (ref 80.0–100.0)

## 2011-05-05 MED ORDER — FUROSEMIDE 10 MG/ML IJ SOLN
40.0000 mg | Freq: Once | INTRAMUSCULAR | Status: AC
  Administered 2011-05-05: 40 mg via INTRAVENOUS
  Filled 2011-05-05: qty 4

## 2011-05-05 MED ORDER — NITROGLYCERIN IN D5W 200-5 MCG/ML-% IV SOLN
2.0000 ug/min | Freq: Once | INTRAVENOUS | Status: AC
  Administered 2011-05-05: 5 ug/min via INTRAVENOUS
  Filled 2011-05-05: qty 250

## 2011-05-05 MED ORDER — IPRATROPIUM BROMIDE 0.02 % IN SOLN
0.5000 mg | Freq: Once | RESPIRATORY_TRACT | Status: AC
  Administered 2011-05-05: 0.5 mg via RESPIRATORY_TRACT
  Filled 2011-05-05: qty 2.5

## 2011-05-05 MED ORDER — LEVOFLOXACIN IN D5W 750 MG/150ML IV SOLN
750.0000 mg | INTRAVENOUS | Status: AC
  Administered 2011-05-06: 750 mg via INTRAVENOUS
  Filled 2011-05-05: qty 150

## 2011-05-05 MED ORDER — METHYLPREDNISOLONE SODIUM SUCC 125 MG IJ SOLR
125.0000 mg | Freq: Once | INTRAMUSCULAR | Status: AC
  Administered 2011-05-05: 125 mg via INTRAVENOUS
  Filled 2011-05-05: qty 2

## 2011-05-05 MED ORDER — FUROSEMIDE 10 MG/ML IJ SOLN
INTRAMUSCULAR | Status: AC
  Filled 2011-05-05: qty 4

## 2011-05-05 MED ORDER — ALBUTEROL SULFATE (5 MG/ML) 0.5% IN NEBU
5.0000 mg | INHALATION_SOLUTION | Freq: Once | RESPIRATORY_TRACT | Status: AC
  Administered 2011-05-05: 5 mg via RESPIRATORY_TRACT
  Filled 2011-05-05: qty 1

## 2011-05-05 NOTE — ED Provider Notes (Signed)
History     CSN: [id number]  Arrival date & time 05/05/11  4098   First MD Initiated Contact with Patient 05/05/11 2010      Chief Complaint  Patient presents with  . Shortness of Breath    (Consider location/radiation/quality/duration/timing/severity/associated sxs/prior treatment) HPI CC SOB onset suddenly this evening, severe, worse with exertion, c/w past episodes of chf exacerbation.  Pt satting 67% o/a of ems, tripodding, one word sentences, rales noted on their exam, pt given 40mg  iv lasix and placed on bipap with significant improvement.  Pt states she has had associated weight gain of 15 lbs over the past 2 weeks.  No pain.  Past Medical History  Diagnosis Date  . COPD (chronic obstructive pulmonary disease)   . Diabetes mellitus   . CHF (congestive heart failure)     History reviewed. No pertinent past surgical history.  History reviewed. No pertinent family history.  History  Substance Use Topics  . Smoking status: Passive Smoker  . Smokeless tobacco: Not on file  . Alcohol Use: No    OB History    Grav Para Term Preterm Abortions TAB SAB Ect Mult Living                  Review of Systems  Constitutional: Positive for fatigue. Negative for fever.  Respiratory: Positive for cough and shortness of breath.   All other systems reviewed and are negative.    Allergies  Review of patient's allergies indicates no known allergies.  Home Medications   Current Outpatient Rx  Name Route Sig Dispense Refill  . METFORMIN HCL 1000 MG PO TABS Oral Take 1,000 mg by mouth 2 (two) times daily with a meal.      BP 142/93  Pulse 113  Temp(Src) 97.6 F (36.4 C) (Oral)  Resp 29  SpO2 100%  Physical Exam  Nursing note and vitals reviewed. Constitutional: She appears well-developed and well-nourished.  HENT:  Head: Normocephalic and atraumatic.  Eyes: Right eye exhibits no discharge. Left eye exhibits no discharge.  Neck: Normal range of motion. Neck supple.    Cardiovascular: Regular rhythm and normal heart sounds.  Tachycardia present.   Pulmonary/Chest: She is in respiratory distress. She has wheezes.  Abdominal: Soft. There is no tenderness.  Musculoskeletal: She exhibits no edema and no tenderness.  Neurological: She is alert. GCS eye subscore is 4. GCS verbal subscore is 5. GCS motor subscore is 6.  Skin: Skin is warm and dry.  Psychiatric: She has a normal mood and affect. Her behavior is normal.    ED Course  Procedures (including critical care time)  Labs Reviewed  CBC - Abnormal; Notable for the following:    WBC 11.0 (*)    All other components within normal limits  BASIC METABOLIC PANEL - Abnormal; Notable for the following:    Glucose, Bld 409 (*)    GFR calc non Af Amer 87 (*)    All other components within normal limits  PRO B NATRIURETIC PEPTIDE - Abnormal; Notable for the following:    Pro B Natriuretic peptide (BNP) 891.8 (*)    All other components within normal limits  POCT I-STAT 3, BLOOD GAS (G3+) - Abnormal; Notable for the following:    pO2, Arterial 63.0 (*)    All other components within normal limits  DIFFERENTIAL   Dg Chest Port 1 View  05/05/2011  *RADIOLOGY REPORT*  Clinical Data: Respiratory distress.  PORTABLE CHEST - 1 VIEW  Comparison: Chest radiograph 12/28/2009  Findings: Normal mediastinum and cardiac silhouette.  Costophrenic angles are clear.  No effusion, infiltrate, or pneumothorax.  IMPRESSION: No acute cardiopulmonary process.  Original Report Authenticated By: Genevive Bi, M.D.     No diagnosis found.   EKG: unchanged from previous tracings, sinus tachycardia, LAD, LVH.  MDM  Pt with resp distress, sudden onset, c/w past chf exacerbations, mixed picture of possible copd as well, pt has 50+ yr h/o smoking 1/2-1 ppd, pt stable and talking in full sentences o/a, placed on bipap, giving breathing tx's, will hold ntg until cxr obtained.  Doubt pe, considered acs, less likely, added  trop.  cxr shows pulm edema, ntg gtt started, consulting internal medicine for admission for chf exacerbation    10:13 PM spoke with [facility] hospitalist, will admit, care transferred to Dr Denton Lank, pt off bipap and comfortable    Elijio Miles, MD 05/05/11 2214

## 2011-05-05 NOTE — ED Notes (Signed)
RT at bedside to take pt off BiPAP. Pt tolerating nasal canula well. Pt hooked up to monitor.

## 2011-05-05 NOTE — ED Notes (Signed)
Patient states sudden onset of respiratory distress with O2 sats in the 60's on room air, patient placed on cpap per ems, patient states has had chf exacerbation in the past.  Patient received 40 mg of lasix pta per ems.

## 2011-05-06 LAB — COMPREHENSIVE METABOLIC PANEL
ALT: 66 U/L — ABNORMAL HIGH (ref 0–35)
Alkaline Phosphatase: 94 U/L (ref 39–117)
CO2: 20 mEq/L (ref 19–32)
Calcium: 9.6 mg/dL (ref 8.4–10.5)
GFR calc Af Amer: >90 mL/min (ref >90)
GFR calc non Af Amer: 88 mL/min — ABNORMAL LOW (ref >90)
Glucose, Bld: 298 mg/dL — ABNORMAL HIGH (ref 70–99)
Sodium: 134 mEq/L — ABNORMAL LOW (ref 135–145)

## 2011-05-06 LAB — GLUCOSE, CAPILLARY
Glucose-Capillary: 475 mg/dL — ABNORMAL HIGH (ref 70–99)
Glucose-Capillary: 512 mg/dL — ABNORMAL HIGH (ref 70–99)

## 2011-05-06 LAB — CBC
MCH: 33 pg (ref 26.0–34.0)
MCHC: 35.3 g/dL (ref 30.0–36.0)
Platelets: 246 10*3/uL (ref 150–400)
RBC: 4.12 MIL/uL (ref 3.87–5.11)
RDW: 13.2 % (ref 11.5–15.5)
WBC: 11.9 10*3/uL — ABNORMAL HIGH (ref 4.0–10.5)

## 2011-05-06 LAB — CREATININE, SERUM
Creatinine, Ser: 0.76 mg/dL (ref 0.50–1.10)
GFR calc Af Amer: >90 mL/min (ref >90)
GFR calc non Af Amer: 85 mL/min — ABNORMAL LOW (ref >90)

## 2011-05-06 LAB — CARDIAC PANEL(CRET KIN+CKTOT+MB+TROPI)
CK, MB: 4.7 ng/mL — ABNORMAL HIGH (ref 0.3–4.0)
CK, MB: 4.4 ng/mL — ABNORMAL HIGH (ref 0.3–4.0)
Relative Index: 3.3 — ABNORMAL HIGH (ref 0.0–2.5)
Troponin I: <0.3 ng/mL (ref <0.30)
Troponin I: <0.3 ng/mL (ref <0.30)

## 2011-05-06 LAB — HEMOGLOBIN A1C
Hgb A1c MFr Bld: 9.1 % — ABNORMAL HIGH (ref <5.7)
Hgb A1c MFr Bld: 9.2 % — ABNORMAL HIGH (ref <5.7)
Mean Plasma Glucose: 217 mg/dL — ABNORMAL HIGH (ref <117)

## 2011-05-06 MED ORDER — ENOXAPARIN SODIUM 40 MG/0.4ML ~~LOC~~ SOLN
40.0000 mg | SUBCUTANEOUS | Status: DC
  Administered 2011-05-06 – 2011-05-07 (×2): 40 mg via SUBCUTANEOUS
  Filled 2011-05-06 (×3): qty 0.4

## 2011-05-06 MED ORDER — ALBUTEROL SULFATE (5 MG/ML) 0.5% IN NEBU
2.5000 mg | INHALATION_SOLUTION | Freq: Four times a day (QID) | RESPIRATORY_TRACT | Status: DC
  Administered 2011-05-06 – 2011-05-08 (×9): 2.5 mg via RESPIRATORY_TRACT
  Filled 2011-05-06 (×10): qty 0.5

## 2011-05-06 MED ORDER — INSULIN ASPART 100 UNIT/ML ~~LOC~~ SOLN
0.0000 [IU] | Freq: Every day | SUBCUTANEOUS | Status: DC
  Administered 2011-05-07: 2 [IU] via SUBCUTANEOUS

## 2011-05-06 MED ORDER — DOCUSATE SODIUM 100 MG PO CAPS
100.0000 mg | ORAL_CAPSULE | Freq: Two times a day (BID) | ORAL | Status: DC
  Administered 2011-05-06 – 2011-05-08 (×3): 100 mg via ORAL
  Filled 2011-05-06 (×6): qty 1

## 2011-05-06 MED ORDER — IPRATROPIUM BROMIDE 0.02 % IN SOLN
0.5000 mg | Freq: Four times a day (QID) | RESPIRATORY_TRACT | Status: DC
  Administered 2011-05-06 – 2011-05-08 (×9): 0.5 mg via RESPIRATORY_TRACT
  Filled 2011-05-06 (×12): qty 2.5

## 2011-05-06 MED ORDER — LEVOFLOXACIN IN D5W 750 MG/150ML IV SOLN
750.0000 mg | INTRAVENOUS | Status: DC
  Administered 2011-05-07 (×2): 750 mg via INTRAVENOUS
  Filled 2011-05-06 (×4): qty 150

## 2011-05-06 MED ORDER — INSULIN ASPART 100 UNIT/ML ~~LOC~~ SOLN
6.0000 [IU] | Freq: Once | SUBCUTANEOUS | Status: AC
  Administered 2011-05-06: 6 [IU] via SUBCUTANEOUS

## 2011-05-06 MED ORDER — INSULIN ASPART 100 UNIT/ML ~~LOC~~ SOLN
0.0000 [IU] | Freq: Three times a day (TID) | SUBCUTANEOUS | Status: DC
  Administered 2011-05-06 – 2011-05-07 (×2): 15 [IU] via SUBCUTANEOUS
  Administered 2011-05-07: 18:00:00 via SUBCUTANEOUS
  Administered 2011-05-08: 5 [IU] via SUBCUTANEOUS
  Administered 2011-05-08: 11 [IU] via SUBCUTANEOUS
  Administered 2011-05-08: 15 [IU] via SUBCUTANEOUS

## 2011-05-06 MED ORDER — METHYLPREDNISOLONE SODIUM SUCC 40 MG IJ SOLR
40.0000 mg | Freq: Two times a day (BID) | INTRAMUSCULAR | Status: DC
  Administered 2011-05-06: 40 mg via INTRAVENOUS
  Filled 2011-05-06 (×3): qty 1

## 2011-05-06 MED ORDER — SODIUM CHLORIDE 0.9 % IJ SOLN: 3.0000 mL | INTRAMUSCULAR | Status: DC | PRN

## 2011-05-06 MED ORDER — ALBUTEROL SULFATE (5 MG/ML) 0.5% IN NEBU
2.5000 mg | INHALATION_SOLUTION | RESPIRATORY_TRACT | Status: DC | PRN
  Filled 2011-05-06: qty 0.5

## 2011-05-06 MED ORDER — SODIUM CHLORIDE 0.9 % IV SOLN: 250.0000 mL | INTRAVENOUS | Status: DC | PRN

## 2011-05-06 MED ORDER — METHYLPREDNISOLONE SODIUM SUCC 125 MG IJ SOLR
80.0000 mg | Freq: Four times a day (QID) | INTRAMUSCULAR | Status: DC
  Administered 2011-05-06 (×2): 80 mg via INTRAVENOUS
  Filled 2011-05-06: qty 1.28
  Filled 2011-05-06: qty 2
  Filled 2011-05-06 (×3): qty 1.28

## 2011-05-06 MED ORDER — ONDANSETRON HCL 4 MG/2ML IJ SOLN: 4.0000 mg | Freq: Four times a day (QID) | INTRAMUSCULAR | Status: DC | PRN

## 2011-05-06 MED ORDER — INSULIN ASPART 100 UNIT/ML ~~LOC~~ SOLN
5.0000 [IU] | Freq: Once | SUBCUTANEOUS | Status: AC
  Administered 2011-05-06: 5 [IU] via SUBCUTANEOUS

## 2011-05-06 MED ORDER — INSULIN ASPART 100 UNIT/ML ~~LOC~~ SOLN: 0.0000 [IU] | Freq: Every day | SUBCUTANEOUS | Status: DC

## 2011-05-06 MED ORDER — FUROSEMIDE 40 MG PO TABS
40.0000 mg | ORAL_TABLET | Freq: Every day | ORAL | Status: DC
  Administered 2011-05-06 – 2011-05-08 (×3): 40 mg via ORAL
  Filled 2011-05-06 (×4): qty 1

## 2011-05-06 MED ORDER — GUAIFENESIN ER 600 MG PO TB12
600.0000 mg | ORAL_TABLET | Freq: Two times a day (BID) | ORAL | Status: DC
  Administered 2011-05-06 – 2011-05-08 (×6): 600 mg via ORAL
  Filled 2011-05-06 (×7): qty 1

## 2011-05-06 MED ORDER — INSULIN ASPART 100 UNIT/ML ~~LOC~~ SOLN
0.0000 [IU] | Freq: Three times a day (TID) | SUBCUTANEOUS | Status: DC
  Administered 2011-05-06: 5 [IU] via SUBCUTANEOUS
  Administered 2011-05-06: 7 [IU] via SUBCUTANEOUS

## 2011-05-06 MED ORDER — SODIUM CHLORIDE 0.9 % IJ SOLN
3.0000 mL | Freq: Two times a day (BID) | INTRAMUSCULAR | Status: DC
  Administered 2011-05-06 – 2011-05-08 (×4): 3 mL via INTRAVENOUS

## 2011-05-06 MED ORDER — MORPHINE SULFATE 2 MG/ML IJ SOLN
1.0000 mg | INTRAMUSCULAR | Status: DC | PRN
Start: 2011-05-06 — End: 2011-05-08

## 2011-05-06 MED ORDER — ONDANSETRON HCL 4 MG PO TABS: 4.0000 mg | ORAL_TABLET | Freq: Four times a day (QID) | ORAL | Status: DC | PRN

## 2011-05-06 MED ORDER — ASPIRIN EC 81 MG PO TBEC
81.0000 mg | DELAYED_RELEASE_TABLET | Freq: Every day | ORAL | Status: DC
  Administered 2011-05-06 – 2011-05-08 (×3): 81 mg via ORAL
  Filled 2011-05-06 (×3): qty 1

## 2011-05-06 NOTE — Progress Notes (Addendum)
Subjective:   Chart reviewed. Patient denies any further dyspnea. She has no chest pain or palpitations.  Objective  Vital signs in last 24 hours: Filed Vitals:   05/06/11 0609 05/06/11 0926 05/06/11 0932 05/06/11 1447  BP: 124/71  131/64   Pulse: 77  101   Temp: 97.9 F (36.6 C)  97.9 F (36.6 C)   TempSrc:      Resp: 18  20   Height:      Weight:      SpO2: 96% 95%  96%   Weight change:   Intake/Output Summary (Last 24 hours) at 05/06/11 1536 Last data filed at 05/06/11 1000  Gross per 24 hour  Intake    720 ml  Output   1801 ml  Net  -1081 ml    Physical Exam:  General Exam: Comfortable. Lying almost supine in bed. Respiratory System: Clear. No increased work of breathing.  Cardiovascular System: First and second heart sounds heard. Regular rate and rhythm. No JVD/murmurs/pedal edema. Telemetry shows normal sinus rhythm. Gastrointestinal System: Abdomen is non distended, soft and normal bowel sounds heard.  Central Nervous System: Alert and oriented. No focal neurological deficits.  Labs:  Basic Metabolic Panel:  Lab 05/06/11 9147 05/06/11 0133 05/05/11 2034  NA 134* -- 135  K 4.2 -- 4.2  CL 96 -- 97  CO2 20 -- 25  GLUCOSE 298* -- 409*  BUN 18 -- 12  CREATININE 0.69 0.76 0.71  CALCIUM 9.6 -- 9.3  ALB -- -- --  PHOS -- -- --   Liver Function Tests:  Lab 05/06/11 0500  AST 43*  ALT 66*  ALKPHOS 94  BILITOT 0.2*  PROT 6.5  ALBUMIN 3.3*   No results found for this basename: LIPASE:3,AMYLASE:3 in the last 168 hours No results found for this basename: AMMONIA:3 in the last 168 hours CBC:  Lab 05/06/11 0500 05/06/11 0133 05/05/11 2034  WBC 9.8 11.9* 11.0*  NEUTROABS -- -- 7.5  HGB 12.9 13.6 13.7  HCT 36.5 37.7 38.8  MCV 91.7 91.5 91.5  PLT 243 246 259   Cardiac Enzymes:  Lab 05/06/11 0901 05/06/11 0133  CKTOTAL 142 133  CKMB 4.7* 4.0  CKMBINDEX -- --  TROPONINI <0.30 <0.30   CBG:  Lab 05/06/11 1100 05/06/11 0610 05/06/11 0258 05/06/11  0048  GLUCAP 315* 267* 475* 512*    Iron Studies: No results found for this basename: IRON,TIBC,TRANSFERRIN,FERRITIN in the last 72 hours Studies/Results: Dg Chest Port 1 View  05/05/2011  *RADIOLOGY REPORT*  Clinical Data: Respiratory distress.  PORTABLE CHEST - 1 VIEW  Comparison: Chest radiograph 12/28/2009  Findings: Normal mediastinum and cardiac silhouette.  Costophrenic angles are clear.  No effusion, infiltrate, or pneumothorax.  IMPRESSION: No acute cardiopulmonary process.  Original Report Authenticated By: Genevive Bi, M.D.   Medications:      . albuterol  2.5 mg Nebulization Q6H  . albuterol  5 mg Nebulization Once  . aspirin EC  81 mg Oral Daily  . docusate sodium  100 mg Oral BID  . enoxaparin  40 mg Subcutaneous Q24H  . furosemide  40 mg Intravenous Once  . guaiFENesin  600 mg Oral BID  . insulin aspart  0-5 Units Subcutaneous QHS  . insulin aspart  0-9 Units Subcutaneous TID WC  . insulin aspart  5 Units Subcutaneous Once  . insulin aspart  6 Units Subcutaneous Once  . ipratropium  0.5 mg Nebulization Once  . ipratropium  0.5 mg Nebulization Q6H  . levofloxacin (  LEVAQUIN) IV  750 mg Intravenous Q24H  . levofloxacin (LEVAQUIN) IV  750 mg Intravenous To Major  . methylPREDNISolone (SOLU-MEDROL) injection  125 mg Intravenous Once  . methylPREDNISolone (SOLU-MEDROL) injection  80 mg Intravenous Q6H  . nitroGLYCERIN  2-200 mcg/min Intravenous Once  . sodium chloride  3 mL Intravenous Q12H    I  have reviewed scheduled and prn medications.     Problem/Plan: Principal Problem:  *Respiratory failure Active Problems:  DM  HYPERTENSION, CONTROLLED  CAD  CHF  COPD exacerbation  1. Acute hypoxic respiratory failure secondary to acute exacerbation of COPD and ? acute on chronic combined congestive heart failure. Improved. Saturating 96-97% on room air. 2. Acute exacerbation of COPD: Improved. Continue nebulizations and we'll reduce intravenous steroids.  Continue Levaquin. 3. ? Acute on chronic combined congestive heart failure: Improved. Echo in 2011 showed left ventricle ejection fraction of 45-50% with diffuse hypokinesis and grade 2 diastolic dysfunction. Will repeat 2-D echocardiogram and continue oral Lasix. 4. Uncontrolled type 2 diabetes mellitus: Complicated by steroids. We'll reduce steroid dose and change sliding scale insulin to moderate sensitivity. We'll check hemoglobin A1c. Patient indicates that she has been having once daily diarrhea since being on metformin for approximately 5 years. 5. Hypertension: Controlled 6. Mild transaminitis: Unclear etiology. ?fatty liver. We'll check LFTs tomorrow.  Discussed patient's care at length with her as well as her daughter Ms. [name] who was at the bedside.  [name] 05/06/2011,3:36 PM  LOS: 1 day

## 2011-05-06 NOTE — H&P (Signed)
[name] is an 66 y.o. female.   Chief Complaint: Respiratory distress HPI: 66 yo with known history of CHF and COPD here with sudden onset of SOB at home. No identified trigger. Patient could not complete sentences due to distress. Arrived on Bipap by EMS who found her oxygen saturations in the 60s at home. Now improving with oxygen, Lasix and nebs in the ER. No chest pain.  Past Medical History  Diagnosis Date  . COPD (chronic obstructive pulmonary disease)   . Diabetes mellitus   . CHF (congestive heart failure)     History reviewed. No pertinent past surgical history.  History reviewed. No pertinent family history. Social History:  reports that she has been passively smoking.  She does not have any smokeless tobacco history on file. She reports that she does not drink alcohol. Her drug history not on file.  Allergies: No Known Allergies  Medications Prior to Admission  Medication Dose Route Frequency Provider Last Rate Last Dose  . 0.9 %  sodium chloride infusion  250 mL Intravenous PRN Rometta Emery, MD      . albuterol (PROVENTIL) (5 MG/ML) 0.5% nebulizer solution 2.5 mg  2.5 mg Nebulization Q6H Rometta Emery, MD   2.5 mg at 05/06/11 0210  . albuterol (PROVENTIL) (5 MG/ML) 0.5% nebulizer solution 2.5 mg  2.5 mg Nebulization Q2H PRN Rometta Emery, MD      . albuterol (PROVENTIL) (5 MG/ML) 0.5% nebulizer solution 5 mg  5 mg Nebulization Once Elijio Miles, MD   5 mg at 05/05/11 2031  . aspirin EC tablet 81 mg  81 mg Oral Daily Rometta Emery, MD      . docusate sodium (COLACE) capsule 100 mg  100 mg Oral BID Rometta Emery, MD      . enoxaparin (LOVENOX) injection 40 mg  40 mg Subcutaneous Q24H Rometta Emery, MD      . furosemide (LASIX) injection 40 mg  40 mg Intravenous Once Elijio Miles, MD   40 mg at 05/05/11 2220  . guaiFENesin (MUCINEX) 12 hr tablet 600 mg  600 mg Oral BID Rometta Emery, MD   600 mg at 05/06/11 0142  . insulin aspart (novoLOG) injection 0-5  Units  0-5 Units Subcutaneous QHS Rolan Lipa, NP      . insulin aspart (novoLOG) injection 0-9 Units  0-9 Units Subcutaneous TID WC Rolan Lipa, NP   5 Units at 05/06/11 [phone]  . insulin aspart (novoLOG) injection 5 Units  5 Units Subcutaneous Once Rolan Lipa, NP   5 Units at 05/06/11 0346  . insulin aspart (novoLOG) injection 6 Units  6 Units Subcutaneous Once Rolan Lipa, NP   6 Units at 05/06/11 0143  . ipratropium (ATROVENT) nebulizer solution 0.5 mg  0.5 mg Nebulization Once Elijio Miles, MD   0.5 mg at 05/05/11 2031  . ipratropium (ATROVENT) nebulizer solution 0.5 mg  0.5 mg Nebulization Q6H Rometta Emery, MD   0.5 mg at 05/06/11 0210  . Levofloxacin (LEVAQUIN) IVPB 750 mg  750 mg Intravenous Q24H Rometta Emery, MD      . Levofloxacin (LEVAQUIN) IVPB 750 mg  750 mg Intravenous To Major Rometta Emery, MD   750 mg at 05/06/11 0026  . methylPREDNISolone sodium succinate (SOLU-MEDROL) 125 mg/2 mL injection 125 mg  125 mg Intravenous Once Elijio Miles, MD   125 mg at 05/05/11 2026  . methylPREDNISolone sodium succinate (SOLU-MEDROL) 125 mg/2 mL injection  80 mg  80 mg Intravenous Q6H Rometta Emery, MD   80 mg at 05/06/11 0350  . morphine 2 MG/ML injection 1 mg  1 mg Intravenous Q4H PRN Rometta Emery, MD      . nitroGLYCERIN 0.2 mg/mL in dextrose 5 % infusion  2-200 mcg/min Intravenous Once Elijio Miles, MD   5 mcg/min at 05/05/11 2138  . ondansetron (ZOFRAN) tablet 4 mg  4 mg Oral Q6H PRN Rometta Emery, MD       Or  . ondansetron (ZOFRAN) injection 4 mg  4 mg Intravenous Q6H PRN Rometta Emery, MD      . sodium chloride 0.9 % injection 3 mL  3 mL Intravenous Q12H Rometta Emery, MD      . sodium chloride 0.9 % injection 3 mL  3 mL Intravenous PRN Rometta Emery, MD       No current outpatient prescriptions on file as of 05/06/2011.    Results for orders placed during the hospital encounter of 05/05/11 (from the past 48 hour(s))   CBC     Status: Abnormal   Collection Time   05/05/11  8:34 PM      Component Value Range Comment   WBC 11.0 (*) 4.0 - 10.5 (K/uL)    RBC 4.24  3.87 - 5.11 (MIL/uL)    Hemoglobin 13.7  12.0 - 15.0 (g/dL)    HCT 16.1  09.6 - 04.5 (%)    MCV 91.5  78.0 - 100.0 (fL)    MCH 32.3  26.0 - 34.0 (pg)    MCHC 35.3  30.0 - 36.0 (g/dL)    RDW 40.9  81.1 - 91.4 (%)    Platelets 259  150 - 400 (K/uL)   DIFFERENTIAL     Status: Normal   Collection Time   05/05/11  8:34 PM      Component Value Range Comment   Neutrophils Relative 67  43 - 77 (%)    Neutro Abs 7.5  1.7 - 7.7 (K/uL)    Lymphocytes Relative 24  12 - 46 (%)    Lymphs Abs 2.6  0.7 - 4.0 (K/uL)    Monocytes Relative 7  3 - 12 (%)    Monocytes Absolute 0.8  0.1 - 1.0 (K/uL)    Eosinophils Relative 1  0 - 5 (%)    Eosinophils Absolute 0.2  0.0 - 0.7 (K/uL)    Basophils Relative 1  0 - 1 (%)    Basophils Absolute 0.1  0.0 - 0.1 (K/uL)   BASIC METABOLIC PANEL     Status: Abnormal   Collection Time   05/05/11  8:34 PM      Component Value Range Comment   Sodium 135  135 - 145 (mEq/L)    Potassium 4.2  3.5 - 5.1 (mEq/L)    Chloride 97  96 - 112 (mEq/L)    CO2 25  19 - 32 (mEq/L)    Glucose, Bld 409 (*) 70 - 99 (mg/dL)    BUN 12  6 - 23 (mg/dL)    Creatinine, Ser 7.82  0.50 - 1.10 (mg/dL)    Calcium 9.3  8.4 - 10.5 (mg/dL)    GFR calc non Af Amer 87 (*) >90 (mL/min)    GFR calc Af Amer >90  >90 (mL/min)   PRO B NATRIURETIC PEPTIDE     Status: Abnormal   Collection Time   05/05/11  8:40 PM      Component Value  Range Comment   Pro B Natriuretic peptide (BNP) 891.8 (*) 0 - 125 (pg/mL)   POCT I-STAT 3, BLOOD GAS (G3+)     Status: Abnormal   Collection Time   05/05/11  8:40 PM      Component Value Range Comment   pH, Arterial 7.388  7.350 - 7.400     pCO2 arterial 38.3  35.0 - 45.0 (mmHg)    pO2, Arterial 63.0 (*) 80.0 - 100.0 (mmHg)    Bicarbonate 23.1  20.0 - 24.0 (mEq/L)    TCO2 24  0 - 100 (mmol/L)    O2 Saturation 92.0       Acid-base deficit 2.0  0.0 - 2.0 (mmol/L)    Patient temperature 98.6 F      Collection site RADIAL, ALLEN'S TEST ACCEPTABLE      Drawn by Operator      Sample type ARTERIAL     GLUCOSE, CAPILLARY     Status: Abnormal   Collection Time   05/06/11 12:48 AM      Component Value Range Comment   Glucose-Capillary 512 (*) 70 - 99 (mg/dL)    Comment 1 Notify RN     CBC     Status: Abnormal   Collection Time   05/06/11  1:33 AM      Component Value Range Comment   WBC 11.9 (*) 4.0 - 10.5 (K/uL)    RBC 4.12  3.87 - 5.11 (MIL/uL)    Hemoglobin 13.6  12.0 - 15.0 (g/dL)    HCT 54.0  98.1 - 19.1 (%)    MCV 91.5  78.0 - 100.0 (fL)    MCH 33.0  26.0 - 34.0 (pg)    MCHC 36.1 (*) 30.0 - 36.0 (g/dL)    RDW 47.8  29.5 - 62.1 (%)    Platelets 246  150 - 400 (K/uL)   CREATININE, SERUM     Status: Abnormal   Collection Time   05/06/11  1:33 AM      Component Value Range Comment   Creatinine, Ser 0.76  0.50 - 1.10 (mg/dL)    GFR calc non Af Amer 85 (*) >90 (mL/min)    GFR calc Af Amer >90  >90 (mL/min)   CARDIAC PANEL(CRET KIN+CKTOT+MB+TROPI)     Status: Abnormal   Collection Time   05/06/11  1:33 AM      Component Value Range Comment   Total CK 133  7 - 177 (U/L)    CK, MB 4.0  0.3 - 4.0 (ng/mL)    Troponin I <0.30  <0.30 (ng/mL)    Relative Index 3.0 (*) 0.0 - 2.5    GLUCOSE, CAPILLARY     Status: Abnormal   Collection Time   05/06/11  2:58 AM      Component Value Range Comment   Glucose-Capillary 475 (*) 70 - 99 (mg/dL)    Comment 1 Notify RN     CBC     Status: Normal   Collection Time   05/06/11  5:00 AM      Component Value Range Comment   WBC 9.8  4.0 - 10.5 (K/uL)    RBC 3.98  3.87 - 5.11 (MIL/uL)    Hemoglobin 12.9  12.0 - 15.0 (g/dL)    HCT 30.8  65.7 - 84.6 (%)    MCV 91.7  78.0 - 100.0 (fL)    MCH 32.4  26.0 - 34.0 (pg)    MCHC 35.3  30.0 - 36.0 (g/dL)    RDW  13.2  11.5 - 15.5 (%)    Platelets 243  150 - 400 (K/uL)   GLUCOSE, CAPILLARY     Status: Abnormal   Collection Time    05/06/11  6:10 AM      Component Value Range Comment   Glucose-Capillary 267 (*) 70 - 99 (mg/dL)    Comment 1 Notify RN      Dg Chest Port 1 View  05/05/2011  *RADIOLOGY REPORT*  Clinical Data: Respiratory distress.  PORTABLE CHEST - 1 VIEW  Comparison: Chest radiograph 12/28/2009  Findings: Normal mediastinum and cardiac silhouette.  Costophrenic angles are clear.  No effusion, infiltrate, or pneumothorax.  IMPRESSION: No acute cardiopulmonary process.  Original Report Authenticated By: Genevive Bi, M.D.    Review of Systems  Unable to perform ROS: acuity of condition    Blood pressure 124/71, pulse 77, temperature 97.9 F (36.6 C), temperature source Oral, resp. rate 18, height 5' 3.5" (1.613 m), weight 70.8 kg (156 lb 1.4 oz), SpO2 96.00%. Physical Exam  Constitutional: She appears well-developed and well-nourished. She appears distressed.  HENT:  Head: Normocephalic and atraumatic.  Right Ear: External ear normal.  Left Ear: External ear normal.  Nose: Nose normal.  Mouth/Throat: Oropharynx is clear and moist.  Eyes: Conjunctivae and EOM are normal. Pupils are equal, round, and reactive to light.  Neck: Normal range of motion. Neck supple.  Cardiovascular: Normal rate, regular rhythm, normal heart sounds and intact distal pulses.   Respiratory: She is in respiratory distress. She has wheezes. She has rales. She exhibits no tenderness.  GI: Soft. Bowel sounds are normal.  Musculoskeletal: Normal range of motion.  Neurological: She is alert. She has normal reflexes.  Skin: Skin is warm. She is diaphoretic.     Assessment/Plan 1. Respiratory failure: More than likely a combination of CHF and COPD exacerbation. Chest X ray more consistent with COPD exacerbation but BNP elevated. Will admit, diurese, continue with cardiac medications, Nebulizer, antibiotics. 2. COPD: as in 1 3: HTN: Home medications and adjust as needed 4. CAD: cycle enzymes and follow on  telemetry  Mafalda Mcginniss,LAWAL 05/06/2011, 6:41 AM

## 2011-05-06 NOTE — ED Provider Notes (Signed)
I saw and evaluated the patient, reviewed the resident's note and I agree with the findings and plan. Pt w hx chf. Presents w progressive sob. bil ll rales. chf on cxr. Lasix iv. ntg gtt. Bipap. Med service called to admit.  Recheck breathing easier, bipap able to be d/c'd. No cp. No fever.   CRITICAL CARE Performed by: Suzi Roots   Total critical care time: 35  Critical care time was exclusive of separately billable procedures and treating other patients.  Critical care was necessary to treat or prevent imminent or life-threatening deterioration.  Critical care was time spent personally by me on the following activities: development of treatment plan with patient and/or surrogate as well as nursing, discussions with consultants, evaluation of patient's response to treatment, examination of patient, obtaining history from patient or surrogate, ordering and performing treatments and interventions, ordering and review of laboratory studies, ordering and review of radiographic studies, pulse oximetry and re-evaluation of patient's condition.   Suzi Roots, MD 05/06/11 1537

## 2011-05-07 LAB — GLUCOSE, CAPILLARY
Glucose-Capillary: 352 mg/dL — ABNORMAL HIGH (ref 70–99)
Glucose-Capillary: 292 mg/dL — ABNORMAL HIGH (ref 70–99)

## 2011-05-07 LAB — COMPREHENSIVE METABOLIC PANEL
AST: 16 U/L (ref 0–37)
CO2: 25 mEq/L (ref 19–32)
Calcium: 9.5 mg/dL (ref 8.4–10.5)
Creatinine, Ser: 0.69 mg/dL (ref 0.50–1.10)
GFR calc Af Amer: >90 mL/min (ref >90)
GFR calc non Af Amer: 88 mL/min — ABNORMAL LOW (ref >90)

## 2011-05-07 MED ORDER — GLIPIZIDE 10 MG PO TABS
10.0000 mg | ORAL_TABLET | Freq: Every day | ORAL | Status: DC
  Administered 2011-05-07 – 2011-05-08 (×2): 10 mg via ORAL
  Filled 2011-05-07 (×3): qty 1

## 2011-05-07 MED ORDER — PREDNISONE 20 MG PO TABS
40.0000 mg | ORAL_TABLET | Freq: Every day | ORAL | Status: DC
  Administered 2011-05-08: 40 mg via ORAL
  Filled 2011-05-07 (×2): qty 2

## 2011-05-07 MED ORDER — INSULIN ASPART 100 UNIT/ML ~~LOC~~ SOLN
16.0000 [IU] | Freq: Once | SUBCUTANEOUS | Status: AC
  Administered 2011-05-07: 16 [IU] via SUBCUTANEOUS

## 2011-05-07 MED ORDER — METFORMIN HCL 500 MG PO TABS
1000.0000 mg | ORAL_TABLET | Freq: Two times a day (BID) | ORAL | Status: DC
  Administered 2011-05-07 – 2011-05-08 (×3): 1000 mg via ORAL
  Filled 2011-05-07 (×4): qty 2

## 2011-05-07 MED ORDER — INSULIN ASPART 100 UNIT/ML ~~LOC~~ SOLN
5.0000 [IU] | Freq: Once | SUBCUTANEOUS | Status: AC
  Administered 2011-05-07: 5 [IU] via SUBCUTANEOUS

## 2011-05-07 NOTE — Progress Notes (Signed)
  Echocardiogram 2D Echocardiogram has been performed.  [name] [facility] 05/07/2011, 3:09 PM

## 2011-05-07 NOTE — Progress Notes (Signed)
Subjective:   No dyspnea or chest pain. Patient has at least 25-pack-year smoking history and smoked until the day of hospital admission. She indicates that she was on glipizide 10 mg by mouth daily and ran out of her medications approximately 6 weeks ago and did not refill. She is non-compliant with her diet. She wishes to restart on her glipizide and declines insulin on discharge.  Objective  Vital signs in last 24 hours: Filed Vitals:   05/07/11 0613 05/07/11 0839 05/07/11 0937 05/07/11 1233  BP: 123/83 125/66  118/72  Pulse: 89 87  85  Temp: 98.3 F (36.8 C) 97.5 F (36.4 C)  98.2 F (36.8 C)  TempSrc: Oral Oral  Oral  Resp: 18 15  16   Height:      Weight: 70.2 kg (154 lb 12.2 oz)     SpO2: 94% 93% 100% 98%   Weight change: -0.6 kg (-1 lb 5.2 oz)  Intake/Output Summary (Last 24 hours) at 05/07/11 1409 Last data filed at 05/07/11 0728  Gross per 24 hour  Intake    633 ml  Output   3400 ml  Net  -2767 ml    Physical Exam:  General Exam: Comfortable.  Respiratory System: Clear. No increased work of breathing.  Cardiovascular System: First and second heart sounds heard. Regular rate and rhythm. No JVD/murmurs/pedal edema. Telemetry shows normal sinus rhythm. Gastrointestinal System: Abdomen is non distended, soft and normal bowel sounds heard.  Central Nervous System: Alert and oriented. No focal neurological deficits.  Labs:  Basic Metabolic Panel:  Lab 05/07/11 1610 05/06/11 0500 05/06/11 0133 05/05/11 2034  NA 133* 134* -- 135  K 4.0 4.2 -- 4.2  CL 96 96 -- 97  CO2 25 20 -- 25  GLUCOSE 445* 298* -- 409*  BUN 24* 18 -- 12  CREATININE 0.69 0.69 0.76 --  CALCIUM 9.5 9.6 -- 9.3  ALB -- -- -- --  PHOS -- -- -- --   Liver Function Tests:  Lab 05/07/11 0610 05/06/11 0500  AST 16 43*  ALT 39* 66*  ALKPHOS 83 94  BILITOT 0.3 0.2*  PROT 6.4 6.5  ALBUMIN 3.3* 3.3*   No results found for this basename: LIPASE:3,AMYLASE:3 in the last 168 hours No results found  for this basename: AMMONIA:3 in the last 168 hours CBC:  Lab 05/06/11 0500 05/06/11 0133 05/05/11 2034  WBC 9.8 11.9* 11.0*  NEUTROABS -- -- 7.5  HGB 12.9 13.6 13.7  HCT 36.5 37.7 38.8  MCV 91.7 91.5 91.5  PLT 243 246 259   Cardiac Enzymes:  Lab 05/06/11 1629 05/06/11 0901 05/06/11 0133  CKTOTAL 130 142 133  CKMB 4.4* 4.7* 4.0  CKMBINDEX -- -- --  TROPONINI <0.30 <0.30 <0.30   CBG:  Lab 05/07/11 1212 05/07/11 0608 05/07/11 0041 05/06/11 2055 05/06/11 1616  GLUCAP 352* 447* 426* 478* 376*    Iron Studies: No results found for this basename: IRON,TIBC,TRANSFERRIN,FERRITIN in the last 72 hours Studies/Results: Dg Chest Port 1 View  05/05/2011  *RADIOLOGY REPORT*  Clinical Data: Respiratory distress.  PORTABLE CHEST - 1 VIEW  Comparison: Chest radiograph 12/28/2009  Findings: Normal mediastinum and cardiac silhouette.  Costophrenic angles are clear.  No effusion, infiltrate, or pneumothorax.  IMPRESSION: No acute cardiopulmonary process.  Original Report Authenticated By: Genevive Bi, M.D.   Medications:      . albuterol  2.5 mg Nebulization Q6H  . aspirin EC  81 mg Oral Daily  . docusate sodium  100 mg Oral BID  .  enoxaparin  40 mg Subcutaneous Q24H  . furosemide  40 mg Oral Daily  . guaiFENesin  600 mg Oral BID  . insulin aspart  0-15 Units Subcutaneous TID WC  . insulin aspart  0-5 Units Subcutaneous QHS  . insulin aspart  16 Units Subcutaneous Once  . insulin aspart  5 Units Subcutaneous Once  . insulin aspart  6 Units Subcutaneous Once  . ipratropium  0.5 mg Nebulization Q6H  . levofloxacin (LEVAQUIN) IV  750 mg Intravenous Q24H  . sodium chloride  3 mL Intravenous Q12H  . DISCONTD: insulin aspart  0-5 Units Subcutaneous QHS  . DISCONTD: insulin aspart  0-9 Units Subcutaneous TID WC  . DISCONTD: methylPREDNISolone (SOLU-MEDROL) injection  80 mg Intravenous Q6H  . DISCONTD: methylPREDNISolone (SOLU-MEDROL) injection  40 mg Intravenous Q12H    I  have reviewed  scheduled and prn medications.     Problem/Plan: Principal Problem:  *Respiratory failure Active Problems:  DM  HYPERTENSION, CONTROLLED  CAD  CHF  COPD exacerbation  1. Acute hypoxic respiratory failure secondary to acute exacerbation of COPD and ? acute combined congestive heart failure. Improved. Saturating 96-97% on room air. 2. Acute exacerbation of COPD: Improved. Continue nebulizations change to oral steroid taper. Continue Levaquin and complete total 5 days.. 3. ? Acute on chronic combined congestive heart failure: Improved. Echo in 2011 showed left ventricle ejection fraction of 45-50% with diffuse hypokinesis and grade 2 diastolic dysfunction. Follow 2-D echocardiogram and continue oral Lasix. 4. Uncontrolled type 2 diabetes mellitus: Complicated by steroids. Noncompliant. Start glipizide 10 mg by mouth daily. Continue metformin. Continue sliding scale. Patient counseled regarding compliance with medications, diet and M.D. followups. She verbalizes understanding. Hemoglobin A1c is 9.2. Patient declines insulin on discharge. 5. Hypertension: Controlled 6. Mild transaminitis: Unclear etiology. ?fatty liver. Improved. 7. Disposition: Possible discharge home tomorrow pending echo results.    [name] 05/07/2011,2:09 PM  LOS: 2 days

## 2011-05-08 LAB — GLUCOSE, CAPILLARY: Glucose-Capillary: 383 mg/dL — ABNORMAL HIGH (ref 70–99)

## 2011-05-08 MED ORDER — PREDNISONE (PAK) 10 MG PO TABS: 40.0000 mg | ORAL_TABLET | Freq: Every day | ORAL | Status: AC

## 2011-05-08 MED ORDER — LEVOFLOXACIN 750 MG PO TABS: 750.0000 mg | ORAL_TABLET | Freq: Every day | ORAL | Status: AC

## 2011-05-08 MED ORDER — GUAIFENESIN ER 600 MG PO TB12: 600.0000 mg | ORAL_TABLET | Freq: Two times a day (BID) | ORAL | Status: AC

## 2011-05-08 MED ORDER — LISINOPRIL 5 MG PO TABS: 5.0000 mg | ORAL_TABLET | Freq: Every day | ORAL | Status: DC

## 2011-05-08 MED ORDER — ASPIRIN 81 MG PO TBEC: 81.0000 mg | DELAYED_RELEASE_TABLET | Freq: Every day | ORAL | Status: AC

## 2011-05-08 MED ORDER — METOPROLOL SUCCINATE 12.5 MG HALF TABLET: 12.5000 mg | ORAL_TABLET | Freq: Every day | ORAL | Status: DC

## 2011-05-08 MED ORDER — FUROSEMIDE 40 MG PO TABS: 40.0000 mg | ORAL_TABLET | Freq: Every day | ORAL | Status: DC

## 2011-05-08 MED ORDER — ALBUTEROL SULFATE HFA 108 (90 BASE) MCG/ACT IN AERS: 2.0000 | INHALATION_SPRAY | Freq: Four times a day (QID) | RESPIRATORY_TRACT | Status: AC | PRN

## 2011-05-08 MED ORDER — GLIPIZIDE 10 MG PO TABS: 10.0000 mg | ORAL_TABLET | Freq: Every day | ORAL | Status: DC

## 2011-05-08 NOTE — Progress Notes (Signed)
   CARE MANAGEMENT NOTE 05/08/2011  Patient:  [name], [name]   Account Number:  [id number]  Date Initiated:  05/08/2011  Documentation initiated by:  Letha Cape  Subjective/Objective Assessment:   dx resp failure, COPD, CHF, Resp Failure   admit- lives alone.  pta independent.     Action/Plan:   Anticipated DC Date:  05/08/2011   Anticipated DC Plan:  HOME/SELF CARE      DC Planning Services  CM consult      Choice offered to / List presented to:             Status of service:  In process, will continue to follow Medicare Important Message given?   (If response is "NO", the following Medicare IM given date fields will be blank) Date Medicare IM given:   Date Additional Medicare IM given:    Discharge Disposition:    Per UR Regulation:    If discussed at Long Length of Stay Meetings, dates discussed:    Comments:  05/08/11 12:23 Letha Cape RN, BSN [phone] Patient lives alone, pta independent.  Patient states she can afford her medications, she works part-time and she get all her meds from [place] for $4.  Patient states she has transportation.

## 2011-05-08 NOTE — Discharge Summary (Signed)
Physician Discharge Summary  Patient ID: [name] MRN: [id number] DOB/AGE: 1945/10/25 66 y.o.  Admit date: 05/05/2011 Discharge date: 05/08/2011  Primary Care Physician:  No primary provider on file.   Discharge Diagnoses:    Principal Problem:  *Respiratory failure Active Problems:  DM  HYPERTENSION, CONTROLLED  CAD  Systolic and diastolic CHF, acute on chronic  COPD exacerbation    Medication List  As of 05/08/2011  1:31 PM   TAKE these medications         albuterol 108 (90 BASE) MCG/ACT inhaler   Commonly known as: PROVENTIL HFA;VENTOLIN HFA   Inhale 2 puffs into the lungs every 6 (six) hours as needed for wheezing.      aspirin 81 MG EC tablet   Take 1 tablet (81 mg total) by mouth daily.      furosemide 40 MG tablet   Commonly known as: LASIX   Take 1 tablet (40 mg total) by mouth daily.      glipiZIDE 10 MG tablet   Commonly known as: GLUCOTROL   Take 1 tablet (10 mg total) by mouth daily before breakfast.      guaiFENesin 600 MG 12 hr tablet   Commonly known as: MUCINEX   Take 1 tablet (600 mg total) by mouth 2 (two) times daily.      levofloxacin 750 MG tablet   Commonly known as: LEVAQUIN   Take 1 tablet (750 mg total) by mouth daily.      lisinopril 5 MG tablet   Commonly known as: PRINIVIL,ZESTRIL   Take 1 tablet (5 mg total) by mouth daily.      metFORMIN 1000 MG tablet   Commonly known as: GLUCOPHAGE   Take 1,000 mg by mouth 2 (two) times daily with a meal.      metoprolol succinate 12.5 mg Tb24   Commonly known as: TOPROL-XL   Take 0.5 tablets (12.5 mg total) by mouth daily.      predniSONE 10 MG tablet   Commonly known as: STERAPRED UNI-PAK   Take 4 tablets (40 mg total) by mouth daily. ; then decrease by 10 mg daily until off.             Disposition and Follow-up:  Will be discharged home today in stable and improved condition. Needs to followup with her PCP in 2 weeks. Will need OP referral to cardiology.  Consults:  None     Significant Diagnostic Studies:  Dg Chest Port 1 View  05/05/2011  *RADIOLOGY REPORT*  Clinical Data: Respiratory distress.  PORTABLE CHEST - 1 VIEW  Comparison: Chest radiograph 12/28/2009  Findings: Normal mediastinum and cardiac silhouette.  Costophrenic angles are clear.  No effusion, infiltrate, or pneumothorax.  IMPRESSION: No acute cardiopulmonary process.  Original Report Authenticated By: Genevive Bi, M.D.    Brief H and P: For complete details please refer to admission H and P, but in brief patient is a 66 yo woman with known history of CHF and COPD here with sudden onset of SOB at home. No identified trigger. Patient could not complete sentences due to distress. Arrived on Bipap by EMS who found her oxygen saturations in the 60s at home. Now improving with oxygen, Lasix and nebs in the ER. No chest pain. We are asked to admit her for further evaluation and management.     Hospital Course:  Principal Problem:  *Respiratory failure Active Problems:  DM  HYPERTENSION, CONTROLLED  CAD  Systolic and diastolic CHF, acute on chronic  COPD exacerbation   #1 Acute Respiratory Failure: Resolved. Not currently on oxygen with excellent oxygen sats. Believed to be 2/2 a combination of a COPD exacerbation as well as acute on chronic systolic and diastolic CHF Exac. Feels well today and is anxious to go home.  #2 Combined CHF: Started on low dose BB and ACE-I as well as ASA. Will need close followup with PCP and referral to cardiologist as an outpatient.  #3 DM: High HbA1C. Would benefit from insulin, however she currently would prefer to be discharged on glipizide and metformin only. Will need close followup with her PCP for adjustment of her diabetic meds.  #4 COPD with acute Exacerbation: Will be discharged on a prednisone taper/albuterol inhaler as well as a 5 day course of levaquin.  Rest of chronic issues are stable.  Time spent on Discharge: Greater than 30  minutes.  SignedChaya Jan [facility] Pager: [phone] 05/08/2011, 1:31 PM

## 2011-05-08 NOTE — Progress Notes (Signed)
Utilization review completed.  

## 2011-05-09 NOTE — Progress Notes (Signed)
   CARE MANAGEMENT NOTE 05/09/2011  Patient:  [name], [name]   Account Number:  [id number]  Date Initiated:  05/08/2011  Documentation initiated by:  Letha Cape  Subjective/Objective Assessment:   dx resp failure, COPD, CHF, Resp Failure   admit- lives alone.  pta independent.     Action/Plan:   Anticipated DC Date:  05/08/2011   Anticipated DC Plan:  HOME/SELF CARE      DC Planning Services  CM consult      Choice offered to / List presented to:             Status of service:  Completed, signed off Medicare Important Message given?   (If response is "NO", the following Medicare IM given date fields will be blank) Date Medicare IM given:   Date Additional Medicare IM given:    Discharge Disposition:  HOME/SELF CARE  Per UR Regulation:    If discussed at Long Length of Stay Meetings, dates discussed:    Comments:  05/08/11 12:23 Letha Cape RN, BSN [phone] Patient lives alone, pta independent.  Patient states she can afford her medications, she works part-time and she get all her meds from [place] for $4.  Patient states she has transportation.  Patient for dc today.

## 2012-04-28 ENCOUNTER — Emergency Department (HOSPITAL_COMMUNITY): Payer: Medicare Other

## 2012-04-28 ENCOUNTER — Emergency Department (HOSPITAL_COMMUNITY)
Admission: EM | Admit: 2012-04-28 | Discharge: 2012-04-28 | Disposition: A | Discharge status: Home / Self Care | Payer: Medicare Other | Attending: Emergency Medicine | Admitting: Emergency Medicine

## 2012-04-28 ENCOUNTER — Encounter (HOSPITAL_COMMUNITY): Payer: Self-pay | Admitting: Emergency Medicine

## 2012-04-28 DIAGNOSIS — R1032 Left lower quadrant pain: Secondary | ICD-10-CM | POA: Insufficient documentation

## 2012-04-28 DIAGNOSIS — Z853 Personal history of malignant neoplasm of breast: Secondary | ICD-10-CM | POA: Insufficient documentation

## 2012-04-28 DIAGNOSIS — R739 Hyperglycemia, unspecified: Secondary | ICD-10-CM

## 2012-04-28 DIAGNOSIS — Z79899 Other long term (current) drug therapy: Secondary | ICD-10-CM | POA: Insufficient documentation

## 2012-04-28 DIAGNOSIS — R1904 Left lower quadrant abdominal swelling, mass and lump: Secondary | ICD-10-CM | POA: Insufficient documentation

## 2012-04-28 DIAGNOSIS — Z7982 Long term (current) use of aspirin: Secondary | ICD-10-CM | POA: Insufficient documentation

## 2012-04-28 DIAGNOSIS — J449 Chronic obstructive pulmonary disease, unspecified: Secondary | ICD-10-CM | POA: Insufficient documentation

## 2012-04-28 DIAGNOSIS — J4489 Other specified chronic obstructive pulmonary disease: Secondary | ICD-10-CM | POA: Insufficient documentation

## 2012-04-28 DIAGNOSIS — IMO0001 Reserved for inherently not codable concepts without codable children: Secondary | ICD-10-CM | POA: Insufficient documentation

## 2012-04-28 DIAGNOSIS — I509 Heart failure, unspecified: Secondary | ICD-10-CM | POA: Insufficient documentation

## 2012-04-28 LAB — URINALYSIS, ROUTINE W REFLEX MICROSCOPIC
Glucose, UA: >1000 mg/dL — AB
Ketones, ur: NEGATIVE mg/dL
Protein, ur: NEGATIVE mg/dL

## 2012-04-28 LAB — CBC WITH DIFFERENTIAL/PLATELET
HCT: 38.7 % (ref 36.0–46.0)
MCH: 32.8 pg (ref 26.0–34.0)
MCHC: 36.2 g/dL — ABNORMAL HIGH (ref 30.0–36.0)
MCV: 90.6 fL (ref 78.0–100.0)
Monocytes Relative: 10 % (ref 3–12)
Platelets: 258 10*3/uL (ref 150–400)
RDW: 12.4 % (ref 11.5–15.5)
WBC: 9 10*3/uL (ref 4.0–10.5)

## 2012-04-28 LAB — COMPREHENSIVE METABOLIC PANEL
Albumin: 3.2 g/dL — ABNORMAL LOW (ref 3.5–5.2)
Alkaline Phosphatase: 90 U/L (ref 39–117)
BUN: 23 mg/dL (ref 6–23)
Chloride: 94 mEq/L — ABNORMAL LOW (ref 96–112)
GFR calc Af Amer: 79 mL/min — ABNORMAL LOW (ref >90)
Glucose, Bld: 443 mg/dL — ABNORMAL HIGH (ref 70–99)
Potassium: 4 mEq/L (ref 3.5–5.1)
Total Bilirubin: 0.3 mg/dL (ref 0.3–1.2)

## 2012-04-28 LAB — URINE MICROSCOPIC-ADD ON

## 2012-04-28 MED ORDER — SODIUM CHLORIDE 0.9 % IV BOLUS (SEPSIS)
1000.0000 mL | Freq: Once | INTRAVENOUS | Status: AC
  Administered 2012-04-28: 1000 mL via INTRAVENOUS

## 2012-04-28 MED ORDER — IOHEXOL 300 MG/ML  SOLN
50.0000 mL | Freq: Once | INTRAMUSCULAR | Status: AC | PRN
  Administered 2012-04-28: 50 mL via ORAL

## 2012-04-28 MED ORDER — OXYCODONE-ACETAMINOPHEN 5-325 MG PO TABS: 1.0000 | ORAL_TABLET | ORAL | Status: DC | PRN

## 2012-04-28 MED ORDER — IOHEXOL 300 MG/ML  SOLN
100.0000 mL | Freq: Once | INTRAMUSCULAR | Status: AC | PRN
  Administered 2012-04-28: 100 mL via INTRAVENOUS

## 2012-04-28 NOTE — ED Notes (Signed)
Pt states that she has had a lump in her low abd since Saturday night.  States it hurts when she does a lot of moving around or pressing on it.  Lump is in LLQ.  Hx cancer.

## 2012-04-28 NOTE — ED Notes (Signed)
Patient transported to CT 

## 2012-04-28 NOTE — ED Provider Notes (Signed)
History     CSN: [id number]  Arrival date & time 04/28/12  1511   First MD Initiated Contact with Patient 04/28/12 1729      Chief Complaint  Patient presents with  . Lump in stomach     (Consider location/radiation/quality/duration/timing/severity/associated sxs/prior treatment) The history is provided by the patient.   67 year old female has noticed a tender lump in her left lower abdomen for about the last 4 days. It is worse if she bends over and the pain has been getting gradually worse. Pain is 6/10 at rest and 10/10 if she tries to bend. She did try applying ice which did seem to give temporary relief. She denies nausea or vomiting. She denies constipation or diarrhea. She denies urinary difficulty. She denies any, to that area. She does have a remote history of breast cancer treated with lumpectomy.  Her PCP is the [facility].  Past Medical History  Diagnosis Date  . COPD (chronic obstructive pulmonary disease)   . Diabetes mellitus   . CHF (congestive heart failure)     History reviewed. No pertinent past surgical history.  History reviewed. No pertinent family history.  History  Substance Use Topics  . Smoking status: Passive Smoke Exposure - Never Smoker  . Smokeless tobacco: Not on file  . Alcohol Use: No    OB History   Grav Para Term Preterm Abortions TAB SAB Ect Mult Living                  Review of Systems  All other systems reviewed and are negative.    Allergies  Review of patient's allergies indicates no known allergies.  Home Medications   Current Outpatient Rx  Name  Route  Sig  Dispense  Refill  . albuterol (PROVENTIL HFA;VENTOLIN HFA) 108 (90 BASE) MCG/ACT inhaler   Inhalation   Inhale 2 puffs into the lungs every 6 (six) hours as needed for wheezing.   1 Inhaler   2   . aspirin EC 81 MG EC tablet   Oral   Take 1 tablet (81 mg total) by mouth daily.         . furosemide (LASIX) 40 MG tablet   Oral   Take 1 tablet  (40 mg total) by mouth daily.   30 tablet   1   . guaiFENesin (MUCINEX) 600 MG 12 hr tablet   Oral   Take 1 tablet (600 mg total) by mouth 2 (two) times daily.         Marland Kitchen lisinopril (PRINIVIL,ZESTRIL) 5 MG tablet   Oral   Take 1 tablet (5 mg total) by mouth daily.   30 tablet   1   . metFORMIN (GLUCOPHAGE) 1000 MG tablet   Oral   Take 1,000 mg by mouth 2 (two) times daily with a meal.           BP 117/63  Pulse 78  Temp(Src) 98.6 F (37 C) (Oral)  Resp 16  SpO2 100%  Physical Exam  Nursing note and vitals reviewed.  67 year old female, resting comfortably and in no acute distress. Vital signs are normal. Oxygen saturation is 100%, which is normal. Head is normocephalic and atraumatic. PERRLA, EOMI. Oropharynx is clear. Neck is nontender and supple without adenopathy or JVD. Back is nontender and there is no CVA tenderness. Lungs are clear without rales, wheezes, or rhonchi. Chest is nontender. Heart has regular rate and rhythm without murmur. Abdomen is soft, flat, without hepatosplenomegaly and peristalsis  is normoactive. There is a 1.5 x 3 cm nodule present in the left lower quadrant which is tender to palpation. It is somewhat firm but not hard. No other masses are felt. Extremities have no cyanosis or edema, full range of motion is present. Skin is warm and dry without rash. Neurologic: Mental status is normal, cranial nerves are intact, there are no motor or sensory deficits.  ED Course  Procedures (including critical care time)  Results for orders placed during the hospital encounter of 04/28/12  CBC WITH DIFFERENTIAL      Result Value Range   WBC 9.0  4.0 - 10.5 K/uL   RBC 4.27  3.87 - 5.11 MIL/uL   Hemoglobin 14.0  12.0 - 15.0 g/dL   HCT 16.1  09.6 - 04.5 %   MCV 90.6  78.0 - 100.0 fL   MCH 32.8  26.0 - 34.0 pg   MCHC 36.2 (*) 30.0 - 36.0 g/dL   RDW 40.9  81.1 - 91.4 %   Platelets 258  150 - 400 K/uL   Neutrophils Relative 53  43 - 77 %   Neutro  Abs 4.7  1.7 - 7.7 K/uL   Lymphocytes Relative 36  12 - 46 %   Lymphs Abs 3.3  0.7 - 4.0 K/uL   Monocytes Relative 10  3 - 12 %   Monocytes Absolute 0.9  0.1 - 1.0 K/uL   Eosinophils Relative 1  0 - 5 %   Eosinophils Absolute 0.1  0.0 - 0.7 K/uL   Basophils Relative 0  0 - 1 %   Basophils Absolute 0.0  0.0 - 0.1 K/uL  COMPREHENSIVE METABOLIC PANEL      Result Value Range   Sodium 132 (*) 135 - 145 mEq/L   Potassium 4.0  3.5 - 5.1 mEq/L   Chloride 94 (*) 96 - 112 mEq/L   CO2 27  19 - 32 mEq/L   Glucose, Bld 443 (*) 70 - 99 mg/dL   BUN 23  6 - 23 mg/dL   Creatinine, Ser 7.82  0.50 - 1.10 mg/dL   Calcium 9.2  8.4 - 95.6 mg/dL   Total Protein 6.6  6.0 - 8.3 g/dL   Albumin 3.2 (*) 3.5 - 5.2 g/dL   AST 13  0 - 37 U/L   ALT 11  0 - 35 U/L   Alkaline Phosphatase 90  39 - 117 U/L   Total Bilirubin 0.3  0.3 - 1.2 mg/dL   GFR calc non Af Amer 68 (*) >90 mL/min   GFR calc Af Amer 79 (*) >90 mL/min  URINALYSIS, ROUTINE W REFLEX MICROSCOPIC      Result Value Range   Color, Urine YELLOW  YELLOW   APPearance CLOUDY (*) CLEAR   Specific Gravity, Urine 1.036 (*) 1.005 - 1.030   pH 5.0  5.0 - 8.0   Glucose, UA >1000 (*) NEGATIVE mg/dL   Hgb urine dipstick NEGATIVE  NEGATIVE   Bilirubin Urine NEGATIVE  NEGATIVE   Ketones, ur NEGATIVE  NEGATIVE mg/dL   Protein, ur NEGATIVE  NEGATIVE mg/dL   Urobilinogen, UA 0.2  0.0 - 1.0 mg/dL   Nitrite NEGATIVE  NEGATIVE   Leukocytes, UA MODERATE (*) NEGATIVE  URINE MICROSCOPIC-ADD ON      Result Value Range   Squamous Epithelial / LPF FEW (*) RARE   WBC, UA 3-6  <3 WBC/hpf   RBC / HPF 0-2  <3 RBC/hpf   Bacteria, UA RARE  RARE  Ct Abdomen Pelvis W Contrast  04/28/2012  *RADIOLOGY REPORT*  Clinical Data: Left lower quadrant palpable abnormality.  CT ABDOMEN AND PELVIS WITH CONTRAST  Technique:  Multidetector CT imaging of the abdomen and pelvis was performed following the standard protocol during bolus administration of intravenous contrast.  Contrast:   100 ml Omnipaque-300.  Comparison: None.  Findings: The lung bases are clear.  There is no pleural or pericardial effusion.  The liver is low attenuating consistent with fatty infiltration. No focal liver lesion is identified.  The adrenal glands, spleen, pancreas and kidneys appear normal.  There is an enhancing subcutaneous lesion in the anterior left lower quadrant measuring 0.7 cm AP by 1.2 cm transverse by 1.3 cm cranial-caudal.  There is some stranding of surrounding fat.  The lesion was contiguous with the rectus abdominus muscle.  The stomach and small and large bowel appear normal.  The uterus has been removed.  There is no lymphadenopathy or fluid.  No focal bony abnormality.  IMPRESSION:  1.  Small, enhancing subcutaneous lesion in the left lower quadrant is nonspecific.  It could represent granulation tissue from trauma, focal cellulitis or less likely neoplasm. 2.  Fatty infiltration of the liver.   Original Report Authenticated By: Holley Dexter, M.D.       1. Abdominal wall mass of left lower quadrant   2. Hyperglycemia       MDM  Nodule in the left lower quadrant of uncertain cause. She'll be sent for CT for further evaluation.  CT shows an enhancing mass but is still nondiagnostic. WBC is normal without a left shift, so do not believe that his infection. She will be referred to general surgery for further outpatient evaluation. She's also advised to check her blood sugars at home and be careful about her diet since she has significant hyperglycemia today. She's given a prescription for Percocet for pain      Dione Booze, MD 04/28/12 [phone]

## 2012-04-28 NOTE — ED Notes (Signed)
Patient has golf ball sized lump in LLQ.  Patient reports pain of 5/10 on palpation.  Patient denies fevers, N/V/D.  Patient states her pain worsens with movement.

## 2012-05-03 ENCOUNTER — Ambulatory Visit (INDEPENDENT_AMBULATORY_CARE_PROVIDER_SITE_OTHER): Payer: Self-pay | Admitting: Surgery

## 2012-05-03 ENCOUNTER — Encounter (INDEPENDENT_AMBULATORY_CARE_PROVIDER_SITE_OTHER): Payer: Self-pay | Admitting: Surgery

## 2012-05-03 VITALS — BP 148/70 | HR 84 | Temp 97.3°F | Resp 16 | Ht 63.5 in | Wt 143.0 lb

## 2012-05-03 DIAGNOSIS — R1904 Left lower quadrant abdominal swelling, mass and lump: Secondary | ICD-10-CM

## 2012-05-03 NOTE — Progress Notes (Signed)
Patient ID: [name], female   DOB: March 30, 1945, 67 y.o.   MRN: [id number]  Chief Complaint  Patient presents with  . New Evaluation    eval abd mass LLQ    HPI [name] is a 67 y.o. female.  Referred by Dr. Burt Knack HPI This is a 67 year old female who was recently moving some heavy furniture at her home. Subsequently she developed some pain in her left low quadrant. She palpated this area and noticed a large firm tender mass. She went to the emergency department where she had no a workup including a CT scan. This showed a palpable mass in the subcutaneous tissues just adjacent to the rectus muscle. She is now referred for surgical evaluation. The patient states that this mass was not present prior to moving all of the furniture. This mass has gotten smaller since her ER visit. She remains mildly tender.  Past Medical History  Diagnosis Date  . COPD (chronic obstructive pulmonary disease)   . Diabetes mellitus   . CHF (congestive heart failure)   . Cancer     breast - left     Past Surgical History  Procedure Laterality Date  . Breast lumpectomy      lt breast    Family History  Problem Relation Age of Onset  . Stroke Father     Social History History  Substance Use Topics  . Smoking status: Passive Smoke Exposure - Never Smoker  . Smokeless tobacco: Never Used  . Alcohol Use: No    No Known Allergies  Current Outpatient Prescriptions  Medication Sig Dispense Refill  . albuterol (PROVENTIL HFA;VENTOLIN HFA) 108 (90 BASE) MCG/ACT inhaler Inhale 2 puffs into the lungs every 6 (six) hours as needed for wheezing.  1 Inhaler  2  . aspirin EC 81 MG EC tablet Take 1 tablet (81 mg total) by mouth daily.      . furosemide (LASIX) 40 MG tablet Take 1 tablet (40 mg total) by mouth daily.  30 tablet  1  . guaiFENesin (MUCINEX) 600 MG 12 hr tablet Take 1 tablet (600 mg total) by mouth 2 (two) times daily.      Marland Kitchen lisinopril (PRINIVIL,ZESTRIL) 5 MG tablet Take 1 tablet (5 mg  total) by mouth daily.  30 tablet  1  . metFORMIN (GLUCOPHAGE) 1000 MG tablet Take 1,000 mg by mouth 2 (two) times daily with a meal.       No current facility-administered medications for this visit.    Review of Systems Review of Systems  Blood pressure 148/70, pulse 84, temperature 97.3 F (36.3 C), temperature source Temporal, resp. rate 16, height 5' 3.5" (1.613 m), weight 143 lb (64.864 kg).  Physical Exam Physical Exam WDWN in NAD Abd - soft; + BS, palpable 1.5 cm mass in the subcutaneous tissues of the LLQ; minimally tender Data Reviewed Clinical Data: Left lower quadrant palpable abnormality.  CT ABDOMEN AND PELVIS WITH CONTRAST  Technique: Multidetector CT imaging of the abdomen and pelvis was  performed following the standard protocol during bolus  administration of intravenous contrast.  Contrast: 100 ml Omnipaque-300.  Comparison: None.  Findings: The lung bases are clear. There is no pleural or  pericardial effusion.  The liver is low attenuating consistent with fatty infiltration.  No focal liver lesion is identified. The adrenal glands, spleen,  pancreas and kidneys appear normal.  There is an enhancing subcutaneous lesion in the anterior left  lower quadrant measuring 0.7 cm AP by 1.2  cm transverse by 1.3 cm  cranial-caudal. There is some stranding of surrounding fat. The  lesion was contiguous with the rectus abdominus muscle.  The stomach and small and large bowel appear normal. The uterus  has been removed. There is no lymphadenopathy or fluid. No focal  bony abnormality.  IMPRESSION:  1. Small, enhancing subcutaneous lesion in the left lower quadrant  is nonspecific. It could represent granulation tissue from trauma,  focal cellulitis or less likely neoplasm.  2. Fatty infiltration of the liver.  Original Report Authenticated By: Holley Dexter, M.D.    Assessment    LLQ mass - likely resolving hematoma due to recent muscle tear    Plan     Observation only.  If the mass persists or enlarges, she will call us back to consider excision.  This should resolve on its own.         TSUEI,MATTHEW K. 05/03/2012, 4:04 PM

## 2012-05-03 NOTE — Patient Instructions (Signed)
If the mass is still present in 2 months, call us at [phone] to schedule another appointment.

## 2012-10-02 ENCOUNTER — Encounter (HOSPITAL_COMMUNITY): Payer: Self-pay | Admitting: Family Medicine

## 2012-10-02 ENCOUNTER — Emergency Department (HOSPITAL_COMMUNITY): Payer: Medicare Other

## 2012-10-02 ENCOUNTER — Inpatient Hospital Stay (HOSPITAL_COMMUNITY)
Admission: EM | Admit: 2012-10-02 | Discharge: 2012-10-06 | DRG: 292 | Disposition: A | Discharge status: Home / Self Care | Payer: Medicare Other | Attending: Internal Medicine | Admitting: Internal Medicine

## 2012-10-02 DIAGNOSIS — J449 Chronic obstructive pulmonary disease, unspecified: Secondary | ICD-10-CM | POA: Diagnosis present

## 2012-10-02 DIAGNOSIS — I2510 Atherosclerotic heart disease of native coronary artery without angina pectoris: Secondary | ICD-10-CM | POA: Diagnosis present

## 2012-10-02 DIAGNOSIS — J4489 Other specified chronic obstructive pulmonary disease: Secondary | ICD-10-CM | POA: Diagnosis present

## 2012-10-02 DIAGNOSIS — E119 Type 2 diabetes mellitus without complications: Secondary | ICD-10-CM

## 2012-10-02 DIAGNOSIS — R931 Abnormal findings on diagnostic imaging of heart and coronary circulation: Secondary | ICD-10-CM | POA: Diagnosis present

## 2012-10-02 DIAGNOSIS — R9389 Abnormal findings on diagnostic imaging of other specified body structures: Secondary | ICD-10-CM | POA: Diagnosis present

## 2012-10-02 DIAGNOSIS — I428 Other cardiomyopathies: Secondary | ICD-10-CM | POA: Diagnosis present

## 2012-10-02 DIAGNOSIS — Z853 Personal history of malignant neoplasm of breast: Secondary | ICD-10-CM

## 2012-10-02 DIAGNOSIS — F17200 Nicotine dependence, unspecified, uncomplicated: Secondary | ICD-10-CM | POA: Diagnosis present

## 2012-10-02 DIAGNOSIS — IMO0001 Reserved for inherently not codable concepts without codable children: Secondary | ICD-10-CM | POA: Diagnosis present

## 2012-10-02 DIAGNOSIS — I509 Heart failure, unspecified: Secondary | ICD-10-CM | POA: Diagnosis present

## 2012-10-02 DIAGNOSIS — I5043 Acute on chronic combined systolic (congestive) and diastolic (congestive) heart failure: Secondary | ICD-10-CM | POA: Diagnosis present

## 2012-10-02 DIAGNOSIS — E1169 Type 2 diabetes mellitus with other specified complication: Secondary | ICD-10-CM | POA: Diagnosis present

## 2012-10-02 DIAGNOSIS — I10 Essential (primary) hypertension: Secondary | ICD-10-CM | POA: Diagnosis present

## 2012-10-02 LAB — GLUCOSE, CAPILLARY: Glucose-Capillary: 337 mg/dL — ABNORMAL HIGH (ref 70–99)

## 2012-10-02 MED ORDER — FUROSEMIDE 10 MG/ML IJ SOLN
40.0000 mg | Freq: Once | INTRAMUSCULAR | Status: AC
  Administered 2012-10-02: 40 mg via INTRAVENOUS
  Filled 2012-10-02: qty 4

## 2012-10-02 NOTE — ED Provider Notes (Signed)
CSN: [id number]     Arrival date & time 10/02/12  2304 History     First MD Initiated Contact with Patient 10/02/12 2318     Chief Complaint  Patient presents with  . Shortness of Breath   (Consider location/radiation/quality/duration/timing/severity/associated sxs/prior Treatment) HPI Hx per PT - has CHF, ran out of Lasix 2 months ago, now SOB x 2 days, worse with laying flat and exertion, 6-7 pound weight gain overnight, feels like her CHF. No CP, no F/C, no cough or hemoptysis. No calf pain or swelling. Symptoms mod to severe Past Medical History  Diagnosis Date  . COPD (chronic obstructive pulmonary disease)   . Diabetes mellitus   . CHF (congestive heart failure)   . Cancer     breast - left    Past Surgical History  Procedure Laterality Date  . Breast lumpectomy      lt breast   Family History  Problem Relation Age of Onset  . Stroke Father    History  Substance Use Topics  . Smoking status: Former Smoker    Quit date: 09/29/2012  . Smokeless tobacco: Never Used  . Alcohol Use: No   OB History   Grav Para Term Preterm Abortions TAB SAB Ect Mult Living                 Review of Systems  Constitutional: Negative for fever and chills.  HENT: Negative for neck pain and neck stiffness.   Eyes: Negative for pain.  Respiratory: Positive for shortness of breath.   Cardiovascular: Negative for chest pain.  Gastrointestinal: Negative for abdominal pain.  Genitourinary: Negative for dysuria.  Musculoskeletal: Negative for back pain.  Skin: Negative for rash.  Neurological: Negative for headaches.  All other systems reviewed and are negative.    Allergies  Review of patient's allergies indicates no known allergies.  Home Medications   Current Outpatient Rx  Name  Route  Sig  Dispense  Refill  . metFORMIN (GLUCOPHAGE) 1000 MG tablet   Oral   Take 1,000 mg by mouth 2 (two) times daily with a meal.          BP 157/85  Pulse 92  Temp(Src) 98.3 F (36.8  C) (Oral)  Resp 18  Ht 5\' 4"  (1.626 m)  Wt 143 lb (64.864 kg)  BMI 24.53 kg/m2  SpO2 98% Physical Exam  Constitutional: She is oriented to person, place, and time. She appears well-developed and well-nourished.  HENT:  Head: Normocephalic and atraumatic.  Eyes: EOM are normal. Pupils are equal, round, and reactive to light.  Neck: Neck supple.  Cardiovascular: Normal rate, regular rhythm and intact distal pulses.   Pulmonary/Chest: Effort normal. No respiratory distress. She exhibits no tenderness.  Tachypnea, posterior rales  Abdominal: Soft. Bowel sounds are normal. She exhibits no distension. There is no tenderness.  Musculoskeletal: Normal range of motion. She exhibits no tenderness.  Trace symmetric pretibial edema  Neurological: She is alert and oriented to person, place, and time.  Skin: Skin is warm and dry.    ED Course   Procedures (including critical care time)  Results for orders placed during the hospital encounter of 10/02/12  CBC      Result Value Range   WBC 8.8  4.0 - 10.5 K/uL   RBC 3.88  3.87 - 5.11 MIL/uL   Hemoglobin 12.6  12.0 - 15.0 g/dL   HCT 56.2 (*) 13.0 - 86.5 %   MCV 92.5  78.0 - 100.0 fL  MCH 32.5  26.0 - 34.0 pg   MCHC 35.1  30.0 - 36.0 g/dL   RDW 16.1  09.6 - 04.5 %   Platelets 228  150 - 400 K/uL  GLUCOSE, CAPILLARY      Result Value Range   Glucose-Capillary 337 (*) 70 - 99 mg/dL   Comment 1 Notify RN    PRO B NATRIURETIC PEPTIDE      Result Value Range   Pro B Natriuretic peptide (BNP) 1108.0 (*) 0 - 125 pg/mL  D-DIMER, QUANTITATIVE      Result Value Range   D-Dimer, Quant 0.37  0.00 - 0.48 ug/mL-FEU  POCT I-STAT, CHEM 8      Result Value Range   Sodium 138  135 - 145 mEq/L   Potassium 3.9  3.5 - 5.1 mEq/L   Chloride 103  96 - 112 mEq/L   BUN 11  6 - 23 mg/dL   Creatinine, Ser 4.09  0.50 - 1.10 mg/dL   Glucose, Bld 811 (*) 70 - 99 mg/dL   Calcium, Ion 9.14  7.82 - 1.30 mmol/L   TCO2 23  0 - 100 mmol/L   Hemoglobin 12.6   12.0 - 15.0 g/dL   HCT 95.6  21.3 - 08.6 %  POCT I-STAT TROPONIN I      Result Value Range   Troponin i, poc 0.01  0.00 - 0.08 ng/mL   Comment 3            Dg Chest Port 1 View  10/02/2012   *RADIOLOGY REPORT*  Clinical Data: Shortness of breath and chest pain  PORTABLE CHEST - 1 VIEW  Comparison: 05/05/2011  Findings: There are diffusely prominent interstitial markings with central bronchial wall thickening.  Heart size upper limits of normal.  No focal lobar consolidation.  No pleural effusion.  No acute osseous finding allowing for technique.  IMPRESSION: Diffusely prominent interstitial bronchitic markings which could indicate bronchitis or atypical/viral infectious etiology.   Original Report Authenticated By: Christiana Pellant, M.D.     Date: 10/02/2012  Rate: 88  Rhythm: normal sinus rhythm  QRS Axis: left  Intervals: normal  ST/T Wave abnormalities: nonspecific ST changes  Conduction Disutrbances:none  Narrative Interpretation: SInus with LVH and PVC  Old EKG Reviewed: none available  Symptomatically feels better with oxygen  11:25 PM CXR reviewed by me pulm edema present, LAsix IV provided, plan MED admit for clinical CHF exacerbation. CBG elevated - PT still has her metformin Rx  1:19 AM d/w DR Clent Ridges, plan admit MDM  Dyspnea/ Orthopnea/ ran out of lasix 2 months ago  ECG, CXR, labs  IV Lasix, O2  MEd admit        Sunnie Nielsen, MD 10/03/12 [phone]

## 2012-10-02 NOTE — ED Notes (Signed)
Son states that patient has had shortness of breath for the past couple of days but today breathing got worse. Patient normally takes furosemide but has been out of it for the past couple of months. Patient had near syncopal episode at triage.

## 2012-10-03 ENCOUNTER — Encounter: Payer: Self-pay | Admitting: Internal Medicine

## 2012-10-03 DIAGNOSIS — I059 Rheumatic mitral valve disease, unspecified: Secondary | ICD-10-CM

## 2012-10-03 LAB — HEMOGLOBIN A1C
Hgb A1c MFr Bld: 10.8 % — ABNORMAL HIGH (ref <5.7)
Mean Plasma Glucose: 263 mg/dL — ABNORMAL HIGH (ref <117)

## 2012-10-03 LAB — POCT I-STAT, CHEM 8
BUN: 11 mg/dL (ref 6–23)
Calcium, Ion: 1.17 mmol/L (ref 1.13–1.30)
HCT: 37 % (ref 36.0–46.0)
TCO2: 23 mmol/L (ref 0–100)

## 2012-10-03 LAB — CBC
Hemoglobin: 12.3 g/dL (ref 12.0–15.0)
MCHC: 35.2 g/dL (ref 30.0–36.0)
Platelets: 226 10*3/uL (ref 150–400)
Platelets: 228 10*3/uL (ref 150–400)
RBC: 3.78 MIL/uL — ABNORMAL LOW (ref 3.87–5.11)
RDW: 13.1 % (ref 11.5–15.5)
WBC: 8.8 10*3/uL (ref 4.0–10.5)

## 2012-10-03 LAB — TROPONIN I: Troponin I: <0.3 ng/mL (ref <0.30)

## 2012-10-03 LAB — BASIC METABOLIC PANEL
CO2: 23 mEq/L (ref 19–32)
GFR calc non Af Amer: >90 mL/min (ref >90)
Glucose, Bld: 240 mg/dL — ABNORMAL HIGH (ref 70–99)
Potassium: 3.6 mEq/L (ref 3.5–5.1)
Sodium: 136 mEq/L (ref 135–145)

## 2012-10-03 LAB — PRO B NATRIURETIC PEPTIDE: Pro B Natriuretic peptide (BNP): 1108 pg/mL — ABNORMAL HIGH (ref 0–125)

## 2012-10-03 LAB — GLUCOSE, CAPILLARY
Glucose-Capillary: 217 mg/dL — ABNORMAL HIGH (ref 70–99)
Glucose-Capillary: 298 mg/dL — ABNORMAL HIGH (ref 70–99)
Glucose-Capillary: 285 mg/dL — ABNORMAL HIGH (ref 70–99)

## 2012-10-03 LAB — POCT I-STAT TROPONIN I: Troponin i, poc: 0.01 ng/mL (ref 0.00–0.08)

## 2012-10-03 MED ORDER — DEXTROSE 50 % IV SOLN: 25.0000 mL | Freq: Once | INTRAVENOUS | Status: AC | PRN

## 2012-10-03 MED ORDER — INSULIN ASPART 100 UNIT/ML ~~LOC~~ SOLN: 5.0000 [IU] | Freq: Once | SUBCUTANEOUS | Status: DC

## 2012-10-03 MED ORDER — SODIUM CHLORIDE 0.9 % IJ SOLN: 3.0000 mL | INTRAMUSCULAR | Status: DC | PRN

## 2012-10-03 MED ORDER — METFORMIN HCL 500 MG PO TABS
1000.0000 mg | ORAL_TABLET | Freq: Two times a day (BID) | ORAL | Status: DC
  Filled 2012-10-03 (×3): qty 2

## 2012-10-03 MED ORDER — INSULIN ASPART 100 UNIT/ML ~~LOC~~ SOLN
0.0000 [IU] | Freq: Three times a day (TID) | SUBCUTANEOUS | Status: DC
  Administered 2012-10-03: 5 [IU] via SUBCUTANEOUS
  Administered 2012-10-03: 2 [IU] via SUBCUTANEOUS
  Administered 2012-10-03: 3 [IU] via SUBCUTANEOUS
  Administered 2012-10-04: 9 [IU] via SUBCUTANEOUS
  Administered 2012-10-04: 3 [IU] via SUBCUTANEOUS
  Administered 2012-10-04: 1 [IU] via SUBCUTANEOUS
  Administered 2012-10-05: 9 [IU] via SUBCUTANEOUS
  Administered 2012-10-05: 2 [IU] via SUBCUTANEOUS
  Administered 2012-10-05 – 2012-10-06 (×2): 1 [IU] via SUBCUTANEOUS

## 2012-10-03 MED ORDER — SODIUM CHLORIDE 0.9 % IJ SOLN
3.0000 mL | Freq: Two times a day (BID) | INTRAMUSCULAR | Status: DC
  Administered 2012-10-03 – 2012-10-06 (×6): 3 mL via INTRAVENOUS

## 2012-10-03 MED ORDER — FUROSEMIDE 10 MG/ML IJ SOLN
40.0000 mg | Freq: Two times a day (BID) | INTRAMUSCULAR | Status: DC
  Administered 2012-10-03 – 2012-10-04 (×4): 40 mg via INTRAVENOUS
  Filled 2012-10-03 (×6): qty 4

## 2012-10-03 MED ORDER — INSULIN GLARGINE 100 UNIT/ML ~~LOC~~ SOLN
8.0000 [IU] | Freq: Every day | SUBCUTANEOUS | Status: DC
  Administered 2012-10-03: 8 [IU] via SUBCUTANEOUS
  Filled 2012-10-03 (×2): qty 0.08

## 2012-10-03 MED ORDER — FUROSEMIDE 40 MG PO TABS
40.0000 mg | ORAL_TABLET | Freq: Every day | ORAL | Status: DC
  Filled 2012-10-03: qty 1

## 2012-10-03 MED ORDER — DEXTROSE 50 % IV SOLN: 50.0000 mL | Freq: Once | INTRAVENOUS | Status: AC | PRN

## 2012-10-03 MED ORDER — GLUCOSE 40 % PO GEL: 1.0000 | ORAL | Status: DC | PRN

## 2012-10-03 MED ORDER — LISINOPRIL 5 MG PO TABS
5.0000 mg | ORAL_TABLET | Freq: Every day | ORAL | Status: DC
  Administered 2012-10-03 – 2012-10-04 (×2): 5 mg via ORAL
  Filled 2012-10-03 (×3): qty 1

## 2012-10-03 MED ORDER — ASPIRIN 81 MG PO CHEW
81.0000 mg | CHEWABLE_TABLET | Freq: Every day | ORAL | Status: DC
  Administered 2012-10-03 – 2012-10-06 (×4): 81 mg via ORAL
  Filled 2012-10-03 (×5): qty 1

## 2012-10-03 MED ORDER — SODIUM CHLORIDE 0.9 % IV SOLN: 250.0000 mL | INTRAVENOUS | Status: DC | PRN

## 2012-10-03 MED ORDER — INSULIN ASPART 100 UNIT/ML ~~LOC~~ SOLN
0.0000 [IU] | Freq: Every day | SUBCUTANEOUS | Status: DC
  Administered 2012-10-03: 3 [IU] via SUBCUTANEOUS
  Administered 2012-10-04: 2 [IU] via SUBCUTANEOUS
  Administered 2012-10-05: 4 [IU] via SUBCUTANEOUS

## 2012-10-03 MED ORDER — SODIUM CHLORIDE 0.9 % IJ SOLN
3.0000 mL | Freq: Two times a day (BID) | INTRAMUSCULAR | Status: DC
  Administered 2012-10-03 – 2012-10-05 (×4): 3 mL via INTRAVENOUS

## 2012-10-03 MED ORDER — HEPARIN SODIUM (PORCINE) 5000 UNIT/ML IJ SOLN
5000.0000 [IU] | Freq: Three times a day (TID) | INTRAMUSCULAR | Status: DC
  Administered 2012-10-03 – 2012-10-06 (×10): 5000 [IU] via SUBCUTANEOUS
  Filled 2012-10-03 (×13): qty 1

## 2012-10-03 NOTE — Progress Notes (Signed)
Inpatient Diabetes Program Recommendations  AACE/ADA: New Consensus Statement on Inpatient Glycemic Control (2013)  Target Ranges:  Prepandial:   less than 140 mg/dL      Peak postprandial:   less than 180 mg/dL (1-2 hours)      Critically ill patients:  140 - 180 mg/dL   Reason for Visit: Hyperglycemia  67 year old female admitted with SOB - ran out of Lasix 2 months ago. States she checks blood sugars at home and "usually runs pretty high. I've gotten off the wagon and I need to do a better job."  Takes metformin 1000 mg bid although has not taken glipizide in several weeks.  Not following diet, eating large quantity of high sugar foods and beverages.  Has been to diabetes classes in the past. Results for NECHUMA, BOVEN (MRN [id number]) as of 10/03/2012 12:33  Ref. Range 10/03/2012 00:08 10/03/2012 05:33  Glucose Latest Range: 70-99 mg/dL 469 (H) 629 (H)   Results for CLAIRISSA, VALVANO (MRN [id number]) as of 10/03/2012 12:33  Ref. Range 10/03/2012 05:33  Hemoglobin A1C Latest Range: <5.7 % 10.8 (H)  Results for ANNETE, AYUSO (MRN [id number]) as of 10/03/2012 12:33  Ref. Range 10/02/2012 23:13 10/03/2012 05:30 10/03/2012 07:39 10/03/2012 11:33  Glucose-Capillary Latest Range: 70-99 mg/dL 644 (H) 034 (H) 742 (H) 190 (H)   HgbA1C indicative of poor glycemic control at home.  Pt states she is ready to make lifestyle changes in order to improve her blood sugars.  Has good family support.  Has appt with [facility] (new MD) on 10/11/2012 for management of DM. May benefit from addition of basal insulin. If pt will be discharged on insulin, please order insulin pen starter kit and RN to instruct on insulin administration. Pt states she does not wish to go to [facility] for diabetes education, which she has attended in the past.   Inpatient Diabetes Program Recommendations Insulin - Basal: May benefit from addition of basal insulin - consider Lantus 8 units QHS HgbA1C: 10.8% - uncontrolled Outpatient Referral:  Refuses OP diabetes education at this time  Note: Will continue to follow.  Thank you. Ailene Ards, RD, LDN, CDE Inpatient Diabetes Coordinator [phone]

## 2012-10-03 NOTE — Progress Notes (Signed)
I have seen and assessed patient and agree with Dr Claris Che assesement and plan. Will cycle cardiac enzymes and check a 2 d echo. Lasix 40mg  IVQ12. ASA. Follow.

## 2012-10-03 NOTE — H&P (Signed)
[facility] History and Physical  [name]:[id number] DOB: 08-12-1945    PCP:   No primary provider on file.   Chief Complaint: [name]  HPI: [name] is an 67 y.o. female with a history of CHF (last ECHO performed in   Shows mildly decreased EF of 50% and mild LVH), diabetes and hypertension presents today with 2 days of orthopnea.  She states that she has not had access to her medications (furosemide and metformin) for several weeks.  She had been feeling well until 2 days ago when she noticed a dry cough and fatigue.  She then began to feel uneasy when she tried to lay down.  She describes being very short of breath if she lays down.  She notes that she has gained 6 lbs over the past two days.  She denies chest pain, peripheral edema. Denies fevers, sweats or chills.  Cough has been non productive with no hemoptysis. She ate heavily salted pinto beans before onset of symptoms.  Rewiew of Systems:  Constitutional: 6 lb weight gain, fatigue, no fever or chills. Eyes: no change in vision. ENMT: no hoarseness, nasal congestion, sinus pressure or sore throat Cardiovascular: Negative for chest pain, palpitations, diaphoresis,or peripheral edema does have orthopnea Respiratory: dry cough, no wheezing Gastrointestinal: Negative for nausea, vomiting, diarrhea, constipation, abdominal pain   Genitourinary: no change Musculoskeletal: Negative for back pain and neck pain. Negative for swelling and trauma.;  Skin: . Negative for pruritus, rash, abrasions, bruising and skin lesion.; ulcerations Neuro: no altered mental status     Past Medical History  Diagnosis Date   COPD (chronic obstructive pulmonary disease)    Diabetes mellitus    CHF (congestive heart failure)    Cancer     breast - left     Past Surgical History  Procedure Laterality Date   Breast lumpectomy      lt breast   Appendectomy      Medications:  HOME MEDS: Prior to Admission medications    Medication Sig Start Date End Date Taking? Authorizing Provider  furosemide (LASIX) 40 MG tablet Take 40 mg by mouth daily.   Yes Historical Provider, MD  glipiZIDE (GLUCOTROL) 10 MG tablet Take 10 mg by mouth daily.   Yes Historical Provider, MD  lisinopril (PRINIVIL,ZESTRIL) 5 MG tablet Take 5 mg by mouth daily.   Yes Historical Provider, MD  metFORMIN (GLUCOPHAGE) 1000 MG tablet Take 1,000 mg by mouth 2 (two) times daily with a meal.   Yes Historical Provider, MD  furosemide (LASIX) 40 MG tablet Take 1 tablet (40 mg total) by mouth daily. 05/08/11 05/07/12  Henderson Cloud, MD  lisinopril (PRINIVIL,ZESTRIL) 5 MG tablet Take 1 tablet (5 mg total) by mouth daily. 05/08/11 05/07/12  Henderson Cloud, MD     Allergies:  No Known Allergies  Social History:   reports that she quit smoking 4 days ago. She has never used smokeless tobacco. She reports that she does not drink alcohol or use illicit drugs. 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  Lives alone.  Has 3 children that live in [place] and are supportive.  Family History: Family History  Problem Relation Age of Onset   Stroke Father   Grandfather with diabetes   Physical Exam: Filed Vitals:   10/02/12 2308 10/02/12 2345 10/03/12 0030 10/03/12 0153  BP: 157/85 139/67 140/75 137/73  Pulse: 92 80 85 78  Temp: 98.3 F (36.8 C)  98.1 F (36.7 C)  TempSrc: Oral   Oral  Resp: 18 18 20 20   Height: 5\' 4"  (1.626 m)   5' 3.5" (1.613 m)  Weight: 64.864 kg (143 lb)   64.5 kg (142 lb 3.2 oz)  SpO2: 98% 100% 100% 98%   Blood pressure 137/73, pulse 78, temperature 98.1 F (36.7 C), temperature source Oral, resp. rate 20, height 5' 3.5" (1.613 m), weight 64.5 kg (142 lb 3.2 oz), SpO2 98.00%.  GEN:  Pleasant  cooperative with exam. PSYCH:  alert and oriented x4; does not appear anxious or depressed; affect is appropriate. HEENT: Mucous membranes pink and anicteric; PERRLA; EOM intact; no cervical  lymphadenopathy nor thyromegaly or carotid bruit; mild JVD CHEST: Normal respiration, clear to auscultation .  HEART: Regular rate and rhythm.  There are no murmur, rub, or gallops.   BACK: No kyphosis or scoliosis; no CVA tenderness ABDOMEN: soft and non-tender; no masses, no organomegaly, normal abdominal bowel sounds EXTREMITIES: No bone or joint deformity; no edema; no ulcerations.  There is no calf tenderness. PULSES: 2+ and symmetric SKIN: Normal hydration no rash or ulceration CNS: non focal  Labs on Admission:  Basic Metabolic Panel:  Recent Labs Lab 10/03/12 0008  NA 138  K 3.9  CL 103  GLUCOSE 325*  BUN 11  CREATININE 0.60   Liver Function Tests: No results found for this basename: AST, ALT, ALKPHOS, BILITOT, PROT, ALBUMIN,  in the last 168 hours No results found for this basename: LIPASE, AMYLASE,  in the last 168 hours No results found for this basename: AMMONIA,  in the last 168 hours CBC:  Recent Labs Lab 10/02/12 2355 10/03/12 0008  WBC 8.8  --   HGB 12.6 12.6  HCT 35.9* 37.0  MCV 92.5  --   PLT 228  --    Cardiac Enzymes: No results found for this basename: CKTOTAL, CKMB, CKMBINDEX, TROPONINI,  in the last 168 hours  CBG:  Recent Labs Lab 10/02/12 2313  GLUCAP 337*     Radiological Exams on Admission: Dg Chest Port 1 View  10/02/2012   *RADIOLOGY REPORT*  Clinical Data: Shortness of breath and chest pain  PORTABLE CHEST - 1 VIEW  Comparison: 05/05/2011  Findings: There are diffusely prominent interstitial markings with central bronchial wall thickening.  Heart size upper limits of normal.  No focal lobar consolidation.  No pleural effusion.  No acute osseous finding allowing for technique.  IMPRESSION: Diffusely prominent interstitial bronchitic markings which could indicate bronchitis or atypical/viral infectious etiology.   Original Report Authenticated By: Christiana Pellant, M.D.    EKG: Independently reviewed. LVH   Assessment/Plan 1)  orthopnea: likely due to CHF exacerbation due to not having access to medication as well as dietary noncompliance.  BNP very elevated today and she has had a 6 lb weight gain.  Will continue with lasix for diuresis.  Her last ECHO was in 04/2011, I will not repeat one tonight, this could be done as an outpatient.  EKG shows no sign of ACS and she has had no chest pain.  She is symptomatically already improving with lasix. 2) DM: check hba1c, will use ssi while in patient. Will need to resume metformin 3) HTN: controled 4) history of pulmonary nodule: CTA 2010 shows several nodules in the RUL as well as thickening of the esophagus.  In this patient with hx of breast cancer this finding should be followed. I do not see that it has been. 5) tobacco abuse: recently quit.  Does not want patch. 6) COPD: unsure of this diagnosis, no pft's in chart that I can see.  She does have history of smoking so some degree is likely.  CTA 2010 showed ground glass opacity, this could be followed up on. 7) dispo: this patient needs to re-establish with a PCP.   8) prophy: heparin  Other plans as per orders.  Code Status: full  Time spent on admission 45 minutes.   Gale Journey, MD. [facility] Pager [phone] 7pm to 7am.  10/03/2012, 3:23 AM

## 2012-10-03 NOTE — ED Notes (Addendum)
Pt. Ambulated on 99% oxygen while ambulating down the hall to the bathroom, down the hall and back to her room. Pt. Ambulated without difficulty.

## 2012-10-04 DIAGNOSIS — R931 Abnormal findings on diagnostic imaging of heart and coronary circulation: Secondary | ICD-10-CM | POA: Diagnosis present

## 2012-10-04 DIAGNOSIS — E119 Type 2 diabetes mellitus without complications: Secondary | ICD-10-CM

## 2012-10-04 DIAGNOSIS — I10 Essential (primary) hypertension: Secondary | ICD-10-CM

## 2012-10-04 DIAGNOSIS — I5043 Acute on chronic combined systolic (congestive) and diastolic (congestive) heart failure: Principal | ICD-10-CM

## 2012-10-04 DIAGNOSIS — R9389 Abnormal findings on diagnostic imaging of other specified body structures: Secondary | ICD-10-CM

## 2012-10-04 DIAGNOSIS — I059 Rheumatic mitral valve disease, unspecified: Secondary | ICD-10-CM

## 2012-10-04 LAB — BASIC METABOLIC PANEL
Chloride: 100 mEq/L (ref 96–112)
GFR calc non Af Amer: 84 mL/min — ABNORMAL LOW (ref >90)
Glucose, Bld: 179 mg/dL — ABNORMAL HIGH (ref 70–99)
Potassium: 3.3 mEq/L — ABNORMAL LOW (ref 3.5–5.1)
Sodium: 139 mEq/L (ref 135–145)

## 2012-10-04 MED ORDER — POTASSIUM CHLORIDE CRYS ER 20 MEQ PO TBCR
40.0000 meq | EXTENDED_RELEASE_TABLET | Freq: Once | ORAL | Status: AC
  Administered 2012-10-04: 40 meq via ORAL
  Filled 2012-10-04: qty 2

## 2012-10-04 MED ORDER — CARVEDILOL 3.125 MG PO TABS
3.1250 mg | ORAL_TABLET | Freq: Two times a day (BID) | ORAL | Status: DC
  Administered 2012-10-04 – 2012-10-06 (×4): 3.125 mg via ORAL
  Filled 2012-10-04 (×6): qty 1

## 2012-10-04 MED ORDER — INSULIN GLARGINE 100 UNIT/ML ~~LOC~~ SOLN
10.0000 [IU] | Freq: Every day | SUBCUTANEOUS | Status: DC
  Administered 2012-10-04: 10 [IU] via SUBCUTANEOUS
  Filled 2012-10-04 (×2): qty 0.1

## 2012-10-04 MED ORDER — LISINOPRIL 2.5 MG PO TABS
2.5000 mg | ORAL_TABLET | Freq: Every day | ORAL | Status: DC
  Administered 2012-10-05 – 2012-10-06 (×2): 2.5 mg via ORAL
  Filled 2012-10-04 (×2): qty 1

## 2012-10-04 NOTE — Progress Notes (Signed)
[facility] PROGRESS NOTE  [name] ZOX:[id number] DOB: 12-26-1945 DOA: 10/02/2012 PCP: No primary provider on file.  Assessment/Plan: #1 acute on chronic systolic CHF exacerbation Likely secondary to not having access to her medications versus ACS. Cardiac enzymes negative x3. Pro BNP trending down. 2-D echo with tearful 40% with hypokinesis worse in the inferior/inferoseptal wall. I/O = neg 2.8L. Continue aspirin, Lasix, lisinopril. Will add low-dose beta blocker, and decrease lisinopril dose. Will consult with cardiology for further evaluation and management.  #2 abnormal 2-D echo 2-D echo with EF of 40% with hypokinesis worse in the inferior/inferoseptal wall. Patient presented with acute on chronic systolic CHF. Will consult with cardiology for further evaluation and management. See #1.  #3 uncontrolled type 2 diabetes Hemoglobin A1c was 10.8. CBGs have ranged from 211 - 369. Increase Lantus to 10 units daily. Sliding scale insulin.  #4 hypertension Stable. Continue Lasix. Decrease lisinopril to 2.5 mg daily and start low dose beta blocker.  #5 COPD Stable. Followup as outpatient.  #6 history of pulmonary nodule Will need outpatient followup.  #7 history of breast cancer Stable.  #8 prophylaxis Lovenox for DVT prophylaxis.  Code Status: Full Family Communication: Updated patient no family present. Disposition Plan: Home when medically stable.   Consultants:  Cardiology pending  Procedures:  Chest x-ray 10/02/2012  2-D echo 10/04/2012  Antibiotics:  None  HPI/Subjective: Patient states she feels great. SOB improved.   Objective: Filed Vitals:   10/04/12 0657  BP: 108/53  Pulse: 70  Temp: 98.1 F (36.7 C)  Resp: 18    Intake/Output Summary (Last 24 hours) at 10/04/12 1522 Last data filed at 10/04/12 0659  Gross per 24 hour  Intake      0 ml  Output   2100 ml  Net  -2100 ml   Filed Weights   10/03/12 0153 10/03/12 0955 10/04/12 0657   Weight: 64.5 kg (142 lb 3.2 oz) 65 kg (143 lb 4.8 oz) 62.5 kg (137 lb 12.6 oz)    Exam:   General:  NAD  Cardiovascular: RRR. No LE edema  Respiratory: Bibasilar crackles. No wheezing  Abdomen: Soft/NT/ND/+BS  Musculoskeletal: 5/5 bue STRENGTH, 5/5 ble STRENGTH  Data Reviewed: Basic Metabolic Panel:  Recent Labs Lab 10/03/12 0008 10/03/12 0533 10/04/12 0443  NA 138 136 139  K 3.9 3.6 3.3*  CL 103 101 100  CO2  --  23 30  GLUCOSE 325* 240* 179*  BUN 11 12 16   CREATININE 0.60 0.62 0.79  CALCIUM  --  9.3 9.5   Liver Function Tests: No results found for this basename: AST, ALT, ALKPHOS, BILITOT, PROT, ALBUMIN,  in the last 168 hours No results found for this basename: LIPASE, AMYLASE,  in the last 168 hours No results found for this basename: AMMONIA,  in the last 168 hours CBC:  Recent Labs Lab 10/02/12 2355 10/03/12 0008 10/03/12 0533  WBC 8.8  --  10.0  HGB 12.6 12.6 12.3  HCT 35.9* 37.0 34.9*  MCV 92.5  --  92.3  PLT 228  --  226   Cardiac Enzymes:  Recent Labs Lab 10/03/12 0915 10/03/12 1331 10/03/12 2045  TROPONINI <0.30 <0.30 <0.30   BNP (last 3 results)  Recent Labs  10/02/12 2355 10/04/12 0443  PROBNP 1108.0* 770.8*   CBG:  Recent Labs Lab 10/03/12 1133 10/03/12 1713 10/03/12 2206 10/04/12 0730 10/04/12 1144  GLUCAP 190* 298* 285* 211* 369*    No results found for this or any previous visit (from  the past 240 hour(s)).   Studies: Dg Chest Port 1 View  10/02/2012   *RADIOLOGY REPORT*  Clinical Data: Shortness of breath and chest pain  PORTABLE CHEST - 1 VIEW  Comparison: 05/05/2011  Findings: There are diffusely prominent interstitial markings with central bronchial wall thickening.  Heart size upper limits of normal.  No focal lobar consolidation.  No pleural effusion.  No acute osseous finding allowing for technique.  IMPRESSION: Diffusely prominent interstitial bronchitic markings which could indicate bronchitis or  atypical/viral infectious etiology.   Original Report Authenticated By: Christiana Pellant, M.D.    Scheduled Meds: . aspirin  81 mg Oral Daily  . furosemide  40 mg Intravenous Q12H  . heparin  5,000 Units Subcutaneous Q8H  . insulin aspart  0-5 Units Subcutaneous QHS  . insulin aspart  0-9 Units Subcutaneous TID WC  . insulin aspart  5 Units Subcutaneous Once  . insulin glargine  8 Units Subcutaneous QHS  . lisinopril  5 mg Oral Daily  . sodium chloride  3 mL Intravenous Q12H  . sodium chloride  3 mL Intravenous Q12H   Continuous Infusions:   Principal Problem:   Systolic and diastolic CHF, acute on chronic Active Problems:   DM   HYPERTENSION, CONTROLLED   CAD    Time spent: > 35 mins    [facility]  [facility] Pager [phone]. If 7PM-7AM, please contact night-coverage at [email or web address], password [facility] 10/04/2012, 3:22 PM  LOS: 2 days

## 2012-10-04 NOTE — Consult Note (Signed)
Admit date: 10/02/2012 Referring Physician  Dr. Janee Morn Primary Physician  none Primary Cardiologist  [place]-new Reason for Consultation  CHF  HPI: 67 y/o who had a cardiomyopathy diagnosed in 2011.  She saw Dr. Amil Amen at that time.  She does not recall having a cardaic cath at that time.  She had been on diuretics for some time.  She has not had a PMD.  SHe has not been following a diabetic diet.  She stopped her diuretics a few months ago.  She noted swelling over the past few weeks.  She then started having sx of orthopnea.  She had an echo showing lower EF.  We are asked to evalute.    She walks at work and haas no chest discomfort or tightness.  No problems with exertion.  She is trying to stop smoking.  She eats a lot of sweets.      PMH:   Past Medical History  Diagnosis Date  . COPD (chronic obstructive pulmonary disease)   . Diabetes mellitus   . CHF (congestive heart failure)   . Cancer     breast - left      PSH:   Past Surgical History  Procedure Laterality Date  . Breast lumpectomy      lt breast  . Appendectomy      Allergies:  Review of patient's allergies indicates no known allergies. Prior to Admit Meds:   Prescriptions prior to admission  Medication Sig Dispense Refill  . furosemide (LASIX) 40 MG tablet Take 40 mg by mouth daily.      Marland Kitchen glipiZIDE (GLUCOTROL) 10 MG tablet Take 10 mg by mouth daily.      Marland Kitchen lisinopril (PRINIVIL,ZESTRIL) 5 MG tablet Take 5 mg by mouth daily.      . metFORMIN (GLUCOPHAGE) 1000 MG tablet Take 1,000 mg by mouth 2 (two) times daily with a meal.      . furosemide (LASIX) 40 MG tablet Take 1 tablet (40 mg total) by mouth daily.  30 tablet  1  . lisinopril (PRINIVIL,ZESTRIL) 5 MG tablet Take 1 tablet (5 mg total) by mouth daily.  30 tablet  1   Fam HX:    Family History  Problem Relation Age of Onset  . Stroke Father    Social HX:    History   Social History  . Marital Status: Widowed    Spouse Name: N/A    Number of  Children: N/A  . Years of Education: N/A   Occupational History  . Not on file.   Social History Main Topics  . Smoking status: Former Smoker -- 30 years    Quit date: 09/29/2012  . Smokeless tobacco: Never Used  . Alcohol Use: No  . Drug Use: No  . Sexual Activity: Not on file   Other Topics Concern  . Not on file   Social History Narrative  . No narrative on file     ROS:  All 11 ROS were addressed and are negative except what is stated in the HPI  Physical Exam: Blood pressure 135/72, pulse 80, temperature 98.4 F (36.9 C), temperature source Oral, resp. rate 16, height 5\' 3"  (1.6 m), weight 62.5 kg (137 lb 12.6 oz), SpO2 99.00%.    General: Well developed, well nourished, in no acute distress Head:    Normal cephalic and atramatic  Lungs:   Clear bilaterally to auscultation and percussion. Heart:   HRRR S1 S2 Abdomen: Bowel sounds are positive, abdomen soft and non-tender without  masses or                  Hernia's noted. Msk:  Back normal, normal gait. Normal strength and tone for age. Extremities:  No edema.  DP +1 Neuro: Alert and oriented X 3. Psych:  Good affect, responds appropriately    Labs:   Lab Results  Component Value Date   WBC 10.0 10/03/2012   HGB 12.3 10/03/2012   HCT 34.9* 10/03/2012   MCV 92.3 10/03/2012   PLT 226 10/03/2012    Recent Labs Lab 10/04/12 0443  NA 139  K 3.3*  CL 100  CO2 30  BUN 16  CREATININE 0.79  CALCIUM 9.5  GLUCOSE 179*   No results found for this basename: PTT   Lab Results  Component Value Date   INR 1.2 07/06/2008   Lab Results  Component Value Date   CKTOTAL 130 05/06/2011   CKMB 4.4* 05/06/2011   TROPONINI <0.30 10/03/2012     Lab Results  Component Value Date   CHOL  Value: 158        ATP III CLASSIFICATION:  <200     mg/dL   Desirable  045-409  mg/dL   Borderline High  >=811    mg/dL   High        11/04/7827   Lab Results  Component Value Date   HDL 52 07/05/2008   Lab Results  Component Value  Date   LDLCALC  Value: 87        Total Cholesterol/HDL:CHD Risk Coronary Heart Disease Risk Table                     Men   Women  1/2 Average Risk   3.4   3.3  Average Risk       5.0   4.4  2 X Average Risk   9.6   7.1  3 X Average Risk  23.4   11.0        Use the calculated Patient Ratio above and the CHD Risk Table to determine the patient's CHD Risk.        ATP III CLASSIFICATION (LDL):  <100     mg/dL   Optimal  562-130  mg/dL   Near or Above                    Optimal  130-159  mg/dL   Borderline  865-784  mg/dL   High  >696     mg/dL   Very High 2/95/2841   Lab Results  Component Value Date   TRIG 94 07/05/2008   Lab Results  Component Value Date   CHOLHDL 3.0 07/05/2008   No results found for this basename: LDLDIRECT      Radiology:  Dg Chest Port 1 View  10/02/2012   *RADIOLOGY REPORT*  Clinical Data: Shortness of breath and chest pain  PORTABLE CHEST - 1 VIEW  Comparison: 05/05/2011  Findings: There are diffusely prominent interstitial markings with central bronchial wall thickening.  Heart size upper limits of normal.  No focal lobar consolidation.  No pleural effusion.  No acute osseous finding allowing for technique.  IMPRESSION: Diffusely prominent interstitial bronchitic markings which could indicate bronchitis or atypical/viral infectious etiology.   Original Report Authenticated By: Christiana Pellant, M.D.    EKG:  NSR, lat T wave inversions  ASSESSMENT: Cardiomyopathy, RF for CAD  PLAN:  Agree with ACE-I and Coreg.  She will likely need Lasix  40-80 mg daily PO at home.    Unclear what was her ischemia w/u in the past.  Given her RF for CAD and recurrent heart failure sx, would consider outpatient ischemia w/u.  I discussed cath vs. Stress with her.  Would likely favor outpatient cath.    F/u with me in a week.    Corky Crafts., MD  10/04/2012  7:51 PM

## 2012-10-04 NOTE — Progress Notes (Signed)
*  PRELIMINARY RESULTS* Echocardiogram 2D Echocardiogram has been performed.  [name] 10/04/2012, 10:16 AM

## 2012-10-05 DIAGNOSIS — I428 Other cardiomyopathies: Secondary | ICD-10-CM | POA: Diagnosis present

## 2012-10-05 DIAGNOSIS — I509 Heart failure, unspecified: Secondary | ICD-10-CM

## 2012-10-05 LAB — CBC
MCH: 33.4 pg (ref 26.0–34.0)
MCHC: 36.3 g/dL — ABNORMAL HIGH (ref 30.0–36.0)
Platelets: 248 10*3/uL (ref 150–400)
RBC: 4.22 MIL/uL (ref 3.87–5.11)

## 2012-10-05 LAB — BASIC METABOLIC PANEL
BUN: 26 mg/dL — ABNORMAL HIGH (ref 6–23)
Calcium: 9.5 mg/dL (ref 8.4–10.5)
GFR calc non Af Amer: 65 mL/min — ABNORMAL LOW (ref >90)
Glucose, Bld: 182 mg/dL — ABNORMAL HIGH (ref 70–99)
Sodium: 138 mEq/L (ref 135–145)

## 2012-10-05 LAB — GLUCOSE, CAPILLARY
Glucose-Capillary: 187 mg/dL — ABNORMAL HIGH (ref 70–99)
Glucose-Capillary: 371 mg/dL — ABNORMAL HIGH (ref 70–99)

## 2012-10-05 LAB — PRO B NATRIURETIC PEPTIDE: Pro B Natriuretic peptide (BNP): 360.6 pg/mL — ABNORMAL HIGH (ref 0–125)

## 2012-10-05 MED ORDER — FUROSEMIDE 40 MG PO TABS
40.0000 mg | ORAL_TABLET | Freq: Two times a day (BID) | ORAL | Status: DC
Start: 2012-10-05 — End: 2012-10-05
  Administered 2012-10-05: 40 mg via ORAL
  Filled 2012-10-05 (×3): qty 1

## 2012-10-05 MED ORDER — INSULIN GLARGINE 100 UNIT/ML ~~LOC~~ SOLN
12.0000 [IU] | Freq: Every day | SUBCUTANEOUS | Status: DC
  Administered 2012-10-05: 12 [IU] via SUBCUTANEOUS
  Filled 2012-10-05 (×2): qty 0.12

## 2012-10-05 MED ORDER — FUROSEMIDE 40 MG PO TABS
40.0000 mg | ORAL_TABLET | Freq: Every day | ORAL | Status: DC
Start: 2012-10-06 — End: 2012-10-06
  Administered 2012-10-06: 40 mg via ORAL
  Filled 2012-10-05: qty 1

## 2012-10-05 NOTE — Progress Notes (Signed)
[facility] PROGRESS NOTE  [name] WUJ:[id number] DOB: [birth year] DOA: 10/02/2012 PCP: No primary provider on file.  Assessment/Plan: #1 acute on chronic systolic CHF exacerbation Likely secondary to not having access to her medications versus ACS. Cardiac enzymes negative x3. Pro BNP trending down. 2-D echo with tearful 40% with hypokinesis worse in the inferior/inferoseptal wall. I/O = neg 1.315L/24 hrs. Continue aspirin, Lasix, lisinopril, coreg. Change IV lasix to oral lasix. Patient seen by cardiology and recommend outpatient ischemic eval of cath vs stress test. Appreciate cardiology input and rxcs.  #2 abnormal 2-D echo/ Cardiomyopathy 2-D echo with EF of 40% with hypokinesis worse in the inferior/inferoseptal wall. Patient presented with acute on chronic systolic CHF. Patient has been seen by cardiology and recommend outpatient ischemic workup. Cardiology ff and appreciate input and rxcs.. See #1.  #3 uncontrolled type 2 diabetes Hemoglobin A1c was 10.8. CBGs have ranged from 187 - 296. Increase Lantus to 12 units daily. Sliding scale insulin.  #4 hypertension Stable. Continue Lasix, lisinopril to 2.5 mg daily, coreg.  #5 COPD Stable. Followup as outpatient.  #6 history of pulmonary nodule Will need outpatient followup.  #7 history of breast cancer Stable.  #8 prophylaxis Lovenox for DVT prophylaxis.  Code Status: Full Family Communication: Updated patient no family present. Disposition Plan: Home when medically stable, hopefully tommorow.   Consultants:  Cardiology: Dr Eldridge Dace 10/04/12  Procedures:  Chest x-ray 10/02/2012  2-D echo 10/04/2012  Antibiotics:  None  HPI/Subjective: Patient states she feels great. SOB improved. No complaints. Worried about CBGs.  Objective: Filed Vitals:   10/05/12 0644  BP: 98/50  Pulse: 71  Temp: 97.9 F (36.6 C)  Resp: 16    Intake/Output Summary (Last 24 hours) at 10/05/12 1032 Last data filed at  10/05/12 0643  Gross per 24 hour  Intake    720 ml  Output   2275 ml  Net  -1555 ml   Filed Weights   10/03/12 0955 10/04/12 0657 10/05/12 0644  Weight: 65 kg (143 lb 4.8 oz) 62.5 kg (137 lb 12.6 oz) 62.7 kg (138 lb 3.7 oz)    Exam:   General:  NAD  Cardiovascular: RRR. No LE edema  Respiratory: CTAB  Abdomen: Soft/NT/ND/+BS  Musculoskeletal: 5/5 bue STRENGTH, 5/5 ble STRENGTH  Data Reviewed: Basic Metabolic Panel:  Recent Labs Lab 10/03/12 0008 10/03/12 0533 10/04/12 0443 10/05/12 0540  NA 138 136 139 138  K 3.9 3.6 3.3* 3.7  CL 103 101 100 101  CO2  --  23 30 28   GLUCOSE 325* 240* 179* 182*  BUN 11 12 16  26*  CREATININE 0.60 0.62 0.79 0.89  CALCIUM  --  9.3 9.5 9.5   Liver Function Tests: No results found for this basename: AST, ALT, ALKPHOS, BILITOT, PROT, ALBUMIN,  in the last 168 hours No results found for this basename: LIPASE, AMYLASE,  in the last 168 hours No results found for this basename: AMMONIA,  in the last 168 hours CBC:  Recent Labs Lab 10/02/12 2355 10/03/12 0008 10/03/12 0533 10/05/12 0540  WBC 8.8  --  10.0 7.7  HGB 12.6 12.6 12.3 14.1  HCT 35.9* 37.0 34.9* 38.8  MCV 92.5  --  92.3 91.9  PLT 228  --  226 248   Cardiac Enzymes:  Recent Labs Lab 10/03/12 0915 10/03/12 1331 10/03/12 2045  TROPONINI <0.30 <0.30 <0.30   BNP (last 3 results)  Recent Labs  10/02/12 2355 10/04/12 0443 10/05/12 0540  PROBNP 1108.0* 770.8* 360.6*  CBG:  Recent Labs Lab 10/04/12 0730 10/04/12 1144 10/04/12 1657 10/04/12 2156 10/05/12 0806  GLUCAP 211* 369* 135* 296* 187*    No results found for this or any previous visit (from the past 240 hour(s)).   Studies: No results found.  Scheduled Meds: . aspirin  81 mg Oral Daily  . carvedilol  3.125 mg Oral BID WC  . furosemide  40 mg Oral BID  . heparin  5,000 Units Subcutaneous Q8H  . insulin aspart  0-5 Units Subcutaneous QHS  . insulin aspart  0-9 Units Subcutaneous TID WC  .  insulin aspart  5 Units Subcutaneous Once  . insulin glargine  12 Units Subcutaneous QHS  . lisinopril  2.5 mg Oral Daily  . sodium chloride  3 mL Intravenous Q12H  . sodium chloride  3 mL Intravenous Q12H   Continuous Infusions:   Principal Problem:   Systolic and diastolic CHF, acute on chronic Active Problems:   DM   HYPERTENSION, CONTROLLED   CAD   Abnormal echocardiogram   Other primary cardiomyopathies    Time spent: > 35 mins    [facility]  [facility] Pager [phone]. If 7PM-7AM, please contact night-coverage at [email or web address], password [facility] 10/05/2012, 10:32 AM  LOS: 3 days

## 2012-10-06 LAB — BASIC METABOLIC PANEL
BUN: 30 mg/dL — ABNORMAL HIGH (ref 6–23)
CO2: 24 mEq/L (ref 19–32)
Calcium: 9.6 mg/dL (ref 8.4–10.5)
Creatinine, Ser: 0.74 mg/dL (ref 0.50–1.10)

## 2012-10-06 LAB — PRO B NATRIURETIC PEPTIDE: Pro B Natriuretic peptide (BNP): 180.6 pg/mL — ABNORMAL HIGH (ref 0–125)

## 2012-10-06 MED ORDER — LISINOPRIL 2.5 MG PO TABS: 2.5000 mg | ORAL_TABLET | Freq: Every day | ORAL | Status: DC

## 2012-10-06 MED ORDER — GLIPIZIDE 10 MG PO TABS: 10.0000 mg | ORAL_TABLET | Freq: Two times a day (BID) | ORAL | Status: DC

## 2012-10-06 MED ORDER — ASPIRIN 81 MG PO CHEW: 81.0000 mg | CHEWABLE_TABLET | Freq: Every day | ORAL | Status: DC

## 2012-10-06 MED ORDER — FUROSEMIDE 40 MG PO TABS: 40.0000 mg | ORAL_TABLET | Freq: Every day | ORAL | Status: DC

## 2012-10-06 MED ORDER — CARVEDILOL 3.125 MG PO TABS: 3.1250 mg | ORAL_TABLET | Freq: Two times a day (BID) | ORAL | Status: DC

## 2012-10-06 MED ORDER — METFORMIN HCL 1000 MG PO TABS: 1000.0000 mg | ORAL_TABLET | Freq: Two times a day (BID) | ORAL | Status: DC

## 2012-10-06 NOTE — Discharge Summary (Signed)
Physician Discharge Summary  DELENE MORAIS ZOX:[id number] DOB: 02-27-1945 DOA: 10/02/2012  PCP: No primary provider on file.  Admit date: 10/02/2012 Discharge date: 10/06/2012  Time spent: 65 minutes  Recommendations for Outpatient Follow-up:  1. Patient is to followup with PCP one week post discharge. On followup patient's diabetes will need to be reassessed. Her blood pressure will also do need to be reassessed. Patient will need a fasting lipid panel, BMET done. 2. Patient is to followup with Dr. Eldridge Dace we will cardiology one week post discharge. All followup further ischemic workup will need to be done as outpatient for a cardiomyopathy. Patient will also need a fasting lipid panel done.   Discharge Diagnoses:  Principal Problem:   Systolic and diastolic CHF, acute on chronic Active Problems:   DM   HYPERTENSION, CONTROLLED   CAD   Abnormal echocardiogram   Other primary cardiomyopathies   Discharge Condition: Stable and improved.  Diet recommendation: Carb modified.  Filed Weights   10/04/12 0657 10/05/12 0644 10/06/12 0500  Weight: 62.5 kg (137 lb 12.6 oz) 62.7 kg (138 lb 3.7 oz) 63.322 kg (139 lb 9.6 oz)    History of present illness:  [name] is an 67 y.o. female with a history of CHF (last ECHO performed in [place] mildly decreased EF of 50% and mild LVH), diabetes and hypertension presents today with 2 days of orthopnea. She states that she has not had access to her medications (furosemide and metformin) for several weeks. She had been feeling well until 2 days ago when she noticed a dry cough and fatigue. She then began to feel uneasy when she tried to lay down. She describes being very short of breath if she lays down. She notes that she has gained 6 lbs over the past two days. She denies chest pain, peripheral edema. Denies fevers, sweats or chills. Cough has been non productive with no hemoptysis. She ate heavily salted pinto beans before onset of  symptoms.   Hospital Course:  #1 acute on chronic systolic and diastolic CHF exacerbation  Patient had presented with a two-day history of orthopnea with a dry cough and generalized fatigue and worsening shortness of breath. Patient had been out of her diuretics and her medications for several weeks prior to admission. Patient was noted to have an elevated BNP and chest x-ray findings were consistent with pulmonary edema. Patient was admitted to telemetry and placed on IV Lasix. It was felt patient's acute on chronic systolic and diastolic CHF were likely secondary to not having access to her medications versus ACS. Cardiac enzymes were cycled and were negative x3. Pro BNP was elevated to follow during the hospitalization and trended down with diuresis. Patient improved clinically and subsequently transitioned to oral Lasix. 2-D echo was obtained which showed an ejection fraction of 40% with hypokinesis worse in the inferior/inferoseptal wall. Cardiology consultation was obtained and patient was seen in consultation by Dr. Eldridge Dace of cardiology.  Patient was started on aspirin, resumed on low-dose ACE inhibitor, and beta blocker was added to her regimen. Patient was negative for 0.755 L throughout the hospitalization. It was felt per cardiology that patient likely had a cardiomyopathy and recommended outpatient ischemic workup to involve either stress test versus cardiac catheterization. Patient remained chest pain-free improved clinically on discharge is stable and improved condition. Patient will followup with Dr. Cecile Sheerer cardiology as outpatient one week post discharge.  #2 abnormal 2-D echo/ Cardiomyopathy  2-D echo with EF of 40% with hypokinesis worse in  the inferior/inferoseptal wall. Patient presented with acute on chronic systolic and diastolic CHF. Patient has been seen by cardiology and recommend outpatient ischemic workup. Patient was maintained on aspirin, Lasix, ACE inhibitor, beta blocker  during the hospitalization.  #3 uncontrolled type 2 diabetes  Hemoglobin A1c was 10.8. CBGs have ranged from 187 - 296. Patient was maintained on Lantus and sliding scale insulin throughout the hospitalization. Patient did state that had stopped taking her medication several weeks prior to admission. Patient was seen by the diabetic coordinator as well. Patient be discharged home back on her prior regimen of glipizide 10 mg twice daily as well as Glucophage 1000 mg twice daily. Patient will followup with PCP as outpatient for further management of her diabetes.   #4 hypertension  Stable. Patient was maintained on Lasix, lisinopril dose was decreased to 2.5 mg daily and Coreg was added to her regimen. Patient blood pressure remained well-controlled throughout the hospitalization. #5 COPD  Stable. Followup as outpatient.  #6 history of pulmonary nodule  Will need outpatient followup.  #7 history of breast cancer  Stable.   Procedures: Chest x-ray 10/02/2012  2-D echo 10/04/2012   Consultations:  Cardiology: Dr Eldridge Dace 10/04/12  Discharge Exam: Filed Vitals:   10/06/12 0505  BP: 118/66  Pulse: 64  Temp: 98 F (36.7 C)  Resp: 20    General: NAD Cardiovascular: RRR Respiratory: CTAB  Discharge Instructions  Discharge Orders   Future Orders Complete By Expires   Diet Carb Modified  As directed    Discharge instructions  As directed    Comments:     Follow up with PCP in 1 week. Follow up with Dr Eldridge Dace, [facility] cardiology in 1 week. Please take medications as prescribed   Increase activity slowly  As directed        Medication List         aspirin 81 MG chewable tablet  Chew 1 tablet (81 mg total) by mouth daily.     carvedilol 3.125 MG tablet  Commonly known as:  COREG  Take 1 tablet (3.125 mg total) by mouth 2 (two) times daily with a meal.     furosemide 40 MG tablet  Commonly known as:  LASIX  Take 1 tablet (40 mg total) by mouth daily.     furosemide 40  MG tablet  Commonly known as:  LASIX  Take 1 tablet (40 mg total) by mouth daily.     glipiZIDE 10 MG tablet  Commonly known as:  GLUCOTROL  Take 1 tablet (10 mg total) by mouth 2 (two) times daily before a meal.     lisinopril 5 MG tablet  Commonly known as:  PRINIVIL,ZESTRIL  Take 1 tablet (5 mg total) by mouth daily.     lisinopril 2.5 MG tablet  Commonly known as:  PRINIVIL,ZESTRIL  Take 1 tablet (2.5 mg total) by mouth daily.     metFORMIN 1000 MG tablet  Commonly known as:  GLUCOPHAGE  Take 1 tablet (1,000 mg total) by mouth 2 (two) times daily with a meal.       No Known Allergies     Follow-up Information   Follow up with Elvina Sidle, MD. Schedule an appointment as soon as possible for a visit in 1 week.   Specialty:  Family Medicine   Contact information:   [facility] [id number] [facility] [place] [address]. [address]   [place]  [place] [phone]       Follow up with Corky Crafts., MD. Schedule an  appointment as soon as possible for a visit in 1 week.   Specialty:  Cardiology   Contact information:   [address] [place] [address] [phone]        The results of significant diagnostics from this hospitalization (including imaging, microbiology, ancillary and laboratory) are listed below for reference.    Significant Diagnostic Studies: Dg Chest Port 1 View  10/02/2012   *RADIOLOGY REPORT*  Clinical Data: Shortness of breath and chest pain  PORTABLE CHEST - 1 VIEW  Comparison: 05/05/2011  Findings: There are diffusely prominent interstitial markings with central bronchial wall thickening.  Heart size upper limits of normal.  No focal lobar consolidation.  No pleural effusion.  No acute osseous finding allowing for technique.  IMPRESSION: Diffusely prominent interstitial bronchitic markings which could indicate bronchitis or atypical/viral infectious etiology.   Original Report Authenticated By: Christiana Pellant, M.D.     Microbiology: No results found for this or any previous visit (from the past 240 hour(s)).   Labs: Basic Metabolic Panel:  Recent Labs Lab 10/03/12 0008 10/03/12 0533 10/04/12 0443 10/05/12 0540 10/06/12 0456  NA 138 136 139 138 133*  K 3.9 3.6 3.3* 3.7 4.3  CL 103 101 100 101 100  CO2  --  23 30 28 24   GLUCOSE 325* 240* 179* 182* 160*  BUN 11 12 16  26* 30*  CREATININE 0.60 0.62 0.79 0.89 0.74  CALCIUM  --  9.3 9.5 9.5 9.6   Liver Function Tests: No results found for this basename: AST, ALT, ALKPHOS, BILITOT, PROT, ALBUMIN,  in the last 168 hours No results found for this basename: LIPASE, AMYLASE,  in the last 168 hours No results found for this basename: AMMONIA,  in the last 168 hours CBC:  Recent Labs Lab 10/02/12 2355 10/03/12 0008 10/03/12 0533 10/05/12 0540  WBC 8.8  --  10.0 7.7  HGB 12.6 12.6 12.3 14.1  HCT 35.9* 37.0 34.9* 38.8  MCV 92.5  --  92.3 91.9  PLT 228  --  226 248   Cardiac Enzymes:  Recent Labs Lab 10/03/12 0915 10/03/12 1331 10/03/12 2045  TROPONINI <0.30 <0.30 <0.30   BNP: BNP (last 3 results)  Recent Labs  10/04/12 0443 10/05/12 0540 10/06/12 0456  PROBNP 770.8* 360.6* 180.6*   CBG:  Recent Labs Lab 10/05/12 0806 10/05/12 1131 10/05/12 1654 10/05/12 2143 10/06/12 0744  GLUCAP 187* 371* 136* 332* 147*       Signed:  THOMPSON,DANIEL  [facility] 10/06/2012, 9:53 AM

## 2012-10-08 ENCOUNTER — Other Ambulatory Visit: Payer: Self-pay | Admitting: Interventional Cardiology

## 2012-10-10 ENCOUNTER — Other Ambulatory Visit: Payer: Self-pay | Admitting: Interventional Cardiology

## 2015-06-28 ENCOUNTER — Observation Stay (HOSPITAL_BASED_OUTPATIENT_CLINIC_OR_DEPARTMENT_OTHER): Payer: Medicare Other

## 2015-06-28 ENCOUNTER — Emergency Department (HOSPITAL_COMMUNITY): Payer: Medicare Other

## 2015-06-28 ENCOUNTER — Encounter (HOSPITAL_COMMUNITY): Payer: Self-pay | Admitting: Emergency Medicine

## 2015-06-28 ENCOUNTER — Inpatient Hospital Stay (HOSPITAL_COMMUNITY)
Admission: EM | Admit: 2015-06-28 | Discharge: 2015-06-29 | DRG: 291 | Disposition: A | Discharge status: Home / Self Care | Payer: Medicare Other | Attending: Internal Medicine | Admitting: Internal Medicine

## 2015-06-28 DIAGNOSIS — I10 Essential (primary) hypertension: Secondary | ICD-10-CM | POA: Diagnosis present

## 2015-06-28 DIAGNOSIS — Z7982 Long term (current) use of aspirin: Secondary | ICD-10-CM

## 2015-06-28 DIAGNOSIS — Z7984 Long term (current) use of oral hypoglycemic drugs: Secondary | ICD-10-CM | POA: Diagnosis not present

## 2015-06-28 DIAGNOSIS — I2510 Atherosclerotic heart disease of native coronary artery without angina pectoris: Secondary | ICD-10-CM | POA: Diagnosis present

## 2015-06-28 DIAGNOSIS — Z87891 Personal history of nicotine dependence: Secondary | ICD-10-CM

## 2015-06-28 DIAGNOSIS — I5043 Acute on chronic combined systolic (congestive) and diastolic (congestive) heart failure: Secondary | ICD-10-CM

## 2015-06-28 DIAGNOSIS — R002 Palpitations: Secondary | ICD-10-CM

## 2015-06-28 DIAGNOSIS — I509 Heart failure, unspecified: Secondary | ICD-10-CM

## 2015-06-28 DIAGNOSIS — Z853 Personal history of malignant neoplasm of breast: Secondary | ICD-10-CM | POA: Diagnosis not present

## 2015-06-28 DIAGNOSIS — I110 Hypertensive heart disease with heart failure: Secondary | ICD-10-CM | POA: Diagnosis present

## 2015-06-28 DIAGNOSIS — R55 Syncope and collapse: Secondary | ICD-10-CM | POA: Diagnosis present

## 2015-06-28 DIAGNOSIS — Z794 Long term (current) use of insulin: Secondary | ICD-10-CM

## 2015-06-28 DIAGNOSIS — J449 Chronic obstructive pulmonary disease, unspecified: Secondary | ICD-10-CM | POA: Diagnosis present

## 2015-06-28 DIAGNOSIS — E11649 Type 2 diabetes mellitus with hypoglycemia without coma: Secondary | ICD-10-CM | POA: Diagnosis present

## 2015-06-28 DIAGNOSIS — D72829 Elevated white blood cell count, unspecified: Secondary | ICD-10-CM | POA: Diagnosis present

## 2015-06-28 DIAGNOSIS — I5022 Chronic systolic (congestive) heart failure: Secondary | ICD-10-CM | POA: Diagnosis present

## 2015-06-28 DIAGNOSIS — J9601 Acute respiratory failure with hypoxia: Secondary | ICD-10-CM | POA: Diagnosis present

## 2015-06-28 DIAGNOSIS — E119 Type 2 diabetes mellitus without complications: Secondary | ICD-10-CM

## 2015-06-28 DIAGNOSIS — E1169 Type 2 diabetes mellitus with other specified complication: Secondary | ICD-10-CM

## 2015-06-28 DIAGNOSIS — I5032 Chronic diastolic (congestive) heart failure: Secondary | ICD-10-CM | POA: Diagnosis present

## 2015-06-28 LAB — I-STAT CHEM 8, ED
BUN: 12 mg/dL (ref 6–20)
Calcium, Ion: 1.1 mmol/L — ABNORMAL LOW (ref 1.13–1.30)
Chloride: 105 mmol/L (ref 101–111)
Creatinine, Ser: 0.7 mg/dL (ref 0.44–1.00)
Glucose, Bld: 184 mg/dL — ABNORMAL HIGH (ref 65–99)
HCT: 40 % (ref 36.0–46.0)
HEMOGLOBIN: 13.6 g/dL (ref 12.0–15.0)
Potassium: 4.2 mmol/L (ref 3.5–5.1)
SODIUM: 140 mmol/L (ref 135–145)
TCO2: 24 mmol/L (ref 0–100)

## 2015-06-28 LAB — CBC WITH DIFFERENTIAL/PLATELET
BASOS PCT: 0 %
Basophils Absolute: 0.1 10*3/uL (ref 0.0–0.1)
EOS ABS: 0.3 10*3/uL (ref 0.0–0.7)
Eosinophils Relative: 2 %
HEMATOCRIT: 37.5 % (ref 36.0–46.0)
HEMOGLOBIN: 12.4 g/dL (ref 12.0–15.0)
LYMPHS ABS: 4.4 10*3/uL — AB (ref 0.7–4.0)
Lymphocytes Relative: 26 %
MCH: 31.5 pg (ref 26.0–34.0)
MCHC: 33.1 g/dL (ref 30.0–36.0)
MCV: 95.2 fL (ref 78.0–100.0)
Monocytes Absolute: 1.1 10*3/uL — ABNORMAL HIGH (ref 0.1–1.0)
Monocytes Relative: 7 %
NEUTROS ABS: 10.8 10*3/uL — AB (ref 1.7–7.7)
NEUTROS PCT: 65 %
Platelets: 319 10*3/uL (ref 150–400)
RBC: 3.94 MIL/uL (ref 3.87–5.11)
RDW: 14.2 % (ref 11.5–15.5)
WBC: 16.7 10*3/uL — AB (ref 4.0–10.5)

## 2015-06-28 LAB — GLUCOSE, CAPILLARY
GLUCOSE-CAPILLARY: 173 mg/dL — AB (ref 65–99)
GLUCOSE-CAPILLARY: 278 mg/dL — AB (ref 65–99)
Glucose-Capillary: 137 mg/dL — ABNORMAL HIGH (ref 65–99)
Glucose-Capillary: 331 mg/dL — ABNORMAL HIGH (ref 65–99)

## 2015-06-28 LAB — I-STAT ARTERIAL BLOOD GAS, ED
Acid-base deficit: 3 mmol/L — ABNORMAL HIGH (ref 0.0–2.0)
BICARBONATE: 23.9 meq/L (ref 20.0–24.0)
O2 Saturation: 100 %
TCO2: 25 mmol/L (ref 0–100)
pCO2 arterial: 49.5 mmHg — ABNORMAL HIGH (ref 35.0–45.0)
pH, Arterial: 7.293 — ABNORMAL LOW (ref 7.350–7.450)
pO2, Arterial: 328 mmHg — ABNORMAL HIGH (ref 80.0–100.0)

## 2015-06-28 LAB — APTT: aPTT: 27 seconds (ref 24–37)

## 2015-06-28 LAB — COMPREHENSIVE METABOLIC PANEL
ALBUMIN: 3.2 g/dL — AB (ref 3.5–5.0)
ALK PHOS: 87 U/L (ref 38–126)
ALT: 97 U/L — AB (ref 14–54)
AST: 103 U/L — ABNORMAL HIGH (ref 15–41)
Anion gap: 10 (ref 5–15)
BILIRUBIN TOTAL: 0.6 mg/dL (ref 0.3–1.2)
BUN: 8 mg/dL (ref 6–20)
CALCIUM: 9.1 mg/dL (ref 8.9–10.3)
CO2: 24 mmol/L (ref 22–32)
CREATININE: 0.9 mg/dL (ref 0.44–1.00)
Chloride: 104 mmol/L (ref 101–111)
GFR calc Af Amer: >60 mL/min (ref >60)
GFR calc non Af Amer: >60 mL/min (ref >60)
GLUCOSE: 196 mg/dL — AB (ref 65–99)
Potassium: 3.8 mmol/L (ref 3.5–5.1)
SODIUM: 138 mmol/L (ref 135–145)
Total Protein: 6.1 g/dL — ABNORMAL LOW (ref 6.5–8.1)

## 2015-06-28 LAB — URINALYSIS, ROUTINE W REFLEX MICROSCOPIC
BILIRUBIN URINE: NEGATIVE
GLUCOSE, UA: NEGATIVE mg/dL
HGB URINE DIPSTICK: NEGATIVE
KETONES UR: NEGATIVE mg/dL
Leukocytes, UA: NEGATIVE
Nitrite: NEGATIVE
PH: 5 (ref 5.0–8.0)
PROTEIN: NEGATIVE mg/dL
SPECIFIC GRAVITY, URINE: 1.009 (ref 1.005–1.030)

## 2015-06-28 LAB — ECHOCARDIOGRAM COMPLETE
HEIGHTINCHES: 64 in
Weight: 2239.87 oz

## 2015-06-28 LAB — PROTIME-INR
INR: 1.08 (ref 0.00–1.49)
Prothrombin Time: 14.2 seconds (ref 11.6–15.2)

## 2015-06-28 LAB — BRAIN NATRIURETIC PEPTIDE: B Natriuretic Peptide: 454.7 pg/mL — ABNORMAL HIGH (ref 0.0–100.0)

## 2015-06-28 LAB — I-STAT TROPONIN, ED: Troponin i, poc: 0.03 ng/mL (ref 0.00–0.08)

## 2015-06-28 LAB — TROPONIN I
Troponin I: 0.07 ng/mL — ABNORMAL HIGH (ref <0.031)
Troponin I: 0.06 ng/mL — ABNORMAL HIGH (ref <0.031)
Troponin I: 0.05 ng/mL — ABNORMAL HIGH (ref <0.031)

## 2015-06-28 LAB — ETHANOL

## 2015-06-28 MED ORDER — NITROGLYCERIN IN D5W 200-5 MCG/ML-% IV SOLN
10.0000 ug/min | INTRAVENOUS | Status: DC
  Administered 2015-06-28: 10 ug/min via INTRAVENOUS
  Filled 2015-06-28: qty 250

## 2015-06-28 MED ORDER — SODIUM CHLORIDE 0.9 % IV SOLN: 250.0000 mL | INTRAVENOUS | Status: DC | PRN

## 2015-06-28 MED ORDER — INSULIN ASPART 100 UNIT/ML ~~LOC~~ SOLN
0.0000 [IU] | Freq: Every day | SUBCUTANEOUS | Status: DC
  Administered 2015-06-28: 4 [IU] via SUBCUTANEOUS

## 2015-06-28 MED ORDER — ONDANSETRON HCL 4 MG/2ML IJ SOLN: 4.0000 mg | Freq: Four times a day (QID) | INTRAMUSCULAR | Status: DC | PRN

## 2015-06-28 MED ORDER — SODIUM CHLORIDE 0.9% FLUSH: 3.0000 mL | INTRAVENOUS | Status: DC | PRN

## 2015-06-28 MED ORDER — CARVEDILOL 3.125 MG PO TABS
3.1250 mg | ORAL_TABLET | Freq: Two times a day (BID) | ORAL | Status: DC
  Administered 2015-06-28 – 2015-06-29 (×4): 3.125 mg via ORAL
  Filled 2015-06-28 (×4): qty 1

## 2015-06-28 MED ORDER — LISINOPRIL 2.5 MG PO TABS
2.5000 mg | ORAL_TABLET | Freq: Every day | ORAL | Status: DC
  Administered 2015-06-28 – 2015-06-29 (×2): 2.5 mg via ORAL
  Filled 2015-06-28 (×2): qty 1

## 2015-06-28 MED ORDER — INSULIN ASPART 100 UNIT/ML ~~LOC~~ SOLN
0.0000 [IU] | Freq: Three times a day (TID) | SUBCUTANEOUS | Status: DC
  Administered 2015-06-28: 5 [IU] via SUBCUTANEOUS
  Administered 2015-06-28: 2 [IU] via SUBCUTANEOUS
  Administered 2015-06-28: 1 [IU] via SUBCUTANEOUS
  Administered 2015-06-29: 2 [IU] via SUBCUTANEOUS
  Administered 2015-06-29: 3 [IU] via SUBCUTANEOUS
  Administered 2015-06-29: 1 [IU] via SUBCUTANEOUS

## 2015-06-28 MED ORDER — ENOXAPARIN SODIUM 40 MG/0.4ML ~~LOC~~ SOLN
40.0000 mg | SUBCUTANEOUS | Status: DC
  Administered 2015-06-28 – 2015-06-29 (×2): 40 mg via SUBCUTANEOUS
  Filled 2015-06-28 (×2): qty 0.4

## 2015-06-28 MED ORDER — SODIUM CHLORIDE 0.9% FLUSH
3.0000 mL | Freq: Two times a day (BID) | INTRAVENOUS | Status: DC
  Administered 2015-06-28 – 2015-06-29 (×3): 3 mL via INTRAVENOUS

## 2015-06-28 MED ORDER — ALBUTEROL SULFATE (2.5 MG/3ML) 0.083% IN NEBU: 2.5000 mg | INHALATION_SOLUTION | RESPIRATORY_TRACT | Status: DC | PRN

## 2015-06-28 MED ORDER — ASPIRIN 81 MG PO CHEW
81.0000 mg | CHEWABLE_TABLET | Freq: Every day | ORAL | Status: DC
  Administered 2015-06-28 – 2015-06-29 (×2): 81 mg via ORAL
  Filled 2015-06-28 (×2): qty 1

## 2015-06-28 MED ORDER — FUROSEMIDE 10 MG/ML IJ SOLN
40.0000 mg | Freq: Once | INTRAMUSCULAR | Status: AC
  Administered 2015-06-28: 40 mg via INTRAVENOUS
  Filled 2015-06-28: qty 4

## 2015-06-28 MED ORDER — ACETAMINOPHEN 325 MG PO TABS: 650.0000 mg | ORAL_TABLET | ORAL | Status: DC | PRN

## 2015-06-28 MED ORDER — FUROSEMIDE 10 MG/ML IJ SOLN
40.0000 mg | Freq: Two times a day (BID) | INTRAMUSCULAR | Status: DC
  Administered 2015-06-28 – 2015-06-29 (×3): 40 mg via INTRAVENOUS
  Filled 2015-06-28 (×3): qty 4

## 2015-06-28 NOTE — ED Notes (Signed)
Pt off bipap, stats 94% on room air. No distress noted.

## 2015-06-28 NOTE — ED Provider Notes (Signed)
CSN: [id number]     Arrival date & time 06/28/15  0248 History  By signing my name below, I, Evelene Croon, attest that this documentation has been prepared under the direction and in the presence of Deno Etienne, DO . Electronically Signed: Evelene Croon, Scribe. 06/28/2015. 3:01 AM.    Chief Complaint  Patient presents with  . Shortness of Breath   LEVEL 5 CAVEAT DUE TO ACUITY OF MEDICAL CONDITION   The history is provided by the patient and the EMS personnel. No language interpreter was used.   HPI Comments:  [name] is a 70 y.o. female with a history of CHF, HTN and COPD, who presents to the Emergency Department via EMS complaining of SOB since last night (06/27/15). Per EMS, pt was found on the ground by fire and rescue semi conscious. EMS reports SPO2 of 84% on RA. Pt was placed on CPAP and given 2 nitro SL en route. EMS states pt seemed to improve with this. They note pt was tachycardic and BP was 160/103. Pt denies fever, chills and CP. She notes episode today is similar to past CHF exacerbation.   Past Medical History  Diagnosis Date  . COPD (chronic obstructive pulmonary disease) ([place])   . Diabetes mellitus   . CHF (congestive heart failure) ([place])   . Cancer [facility])     breast - left    Past Surgical History  Procedure Laterality Date  . Breast lumpectomy      lt breast  . Appendectomy     Family History  Problem Relation Age of Onset  . Stroke Father    Social History  Substance Use Topics  . Smoking status: Former Smoker -- 30 years    Quit date: 09/29/2012  . Smokeless tobacco: Never Used  . Alcohol Use: No   OB History    No data available     Review of Systems  Unable to perform ROS: Acuity of condition  Constitutional: Negative for fever and chills.  HENT: Negative for congestion and rhinorrhea.   Eyes: Negative for redness and visual disturbance.  Respiratory: Positive for shortness of breath. Negative for wheezing.   Cardiovascular: Negative for chest  pain and palpitations.  Gastrointestinal: Negative for nausea and vomiting.  Genitourinary: Negative for dysuria and urgency.  Musculoskeletal: Negative for myalgias and arthralgias.  Skin: Negative for pallor and wound.  Neurological: Negative for dizziness and headaches.   Allergies  Review of patient's allergies indicates no known allergies.  Home Medications   Prior to Admission medications   Medication Sig Start Date End Date Taking? Authorizing Provider  aspirin 81 MG chewable tablet Chew 1 tablet (81 mg total) by mouth daily. 10/06/12  Yes Eugenie Filler, MD  carvedilol (COREG) 3.125 MG tablet Take 1 tablet (3.125 mg total) by mouth 2 (two) times daily with a meal. 10/06/12  Yes Eugenie Filler, MD  furosemide (LASIX) 40 MG tablet Take 1 tablet (40 mg total) by mouth daily. 10/06/12  Yes Eugenie Filler, MD  lisinopril (PRINIVIL,ZESTRIL) 2.5 MG tablet Take 1 tablet (2.5 mg total) by mouth daily. 10/06/12  Yes Eugenie Filler, MD  metFORMIN (GLUCOPHAGE) 1000 MG tablet Take 1 tablet (1,000 mg total) by mouth 2 (two) times daily with a meal. 10/06/12  Yes Eugenie Filler, MD  PRESCRIPTION MEDICATION Inject 10-20 Units into the skin 2 (two) times daily. Use 20 units every morning and use 10 units every evening   Yes Historical Provider, MD  furosemide (  LASIX) 40 MG tablet Take 1 tablet (40 mg total) by mouth daily. 05/08/11 05/07/12  Erline Hau, MD  glipiZIDE (GLUCOTROL) 10 MG tablet Take 1 tablet (10 mg total) by mouth 2 (two) times daily before a meal. Patient not taking: Reported on 06/28/2015 10/06/12   Eugenie Filler, MD  lisinopril (PRINIVIL,ZESTRIL) 5 MG tablet Take 1 tablet (5 mg total) by mouth daily. Patient not taking: Reported on 06/28/2015 05/08/11 06/28/15  Erline Hau, MD   BP 112/59 mmHg  Pulse 96  Temp(Src) 98.5 F (36.9 C) (Oral)  Resp 26  Ht 5\' 4"  (1.626 m)  Wt 130 lb (58.968 kg)  BMI 22.30 kg/m2  SpO2 94% Physical Exam   Constitutional: She is oriented to person, place, and time. She appears well-developed and well-nourished. No distress.  HENT:  Head: Normocephalic and atraumatic.  Eyes: EOM are normal. Pupils are equal, round, and reactive to light.  Neck: Normal range of motion. Neck supple. JVD (up to angle of jaw) present.  Cardiovascular: An irregular rhythm present. Tachycardia present.  Exam reveals no gallop and no friction rub.   No murmur heard. Pulmonary/Chest: Effort normal. Tachypnea noted. She has no rales.  Course breathe sounds in all lung fields.  Abdominal: Soft. She exhibits no distension. There is no tenderness.  Musculoskeletal: She exhibits no edema or tenderness.  Neurological: She is alert and oriented to person, place, and time.  Skin: Skin is warm and dry. She is not diaphoretic.  Psychiatric: She has a normal mood and affect. Her behavior is normal.  Nursing note and vitals reviewed.   ED Course  Procedures   DIAGNOSTIC STUDIES:  Oxygen Saturation is 97% on CPAP, normal by my interpretation.    Labs Review Labs Reviewed  CBC WITH DIFFERENTIAL/PLATELET - Abnormal; Notable for the following:    WBC 16.7 (*)    Neutro Abs 10.8 (*)    Lymphs Abs 4.4 (*)    Monocytes Absolute 1.1 (*)    All other components within normal limits  COMPREHENSIVE METABOLIC PANEL - Abnormal; Notable for the following:    Glucose, Bld 196 (*)    Total Protein 6.1 (*)    Albumin 3.2 (*)    AST 103 (*)    ALT 97 (*)    All other components within normal limits  I-STAT CHEM 8, ED - Abnormal; Notable for the following:    Glucose, Bld 184 (*)    Calcium, Ion 1.10 (*)    All other components within normal limits  I-STAT ARTERIAL BLOOD GAS, ED - Abnormal; Notable for the following:    pH, Arterial 7.293 (*)    pCO2 arterial 49.5 (*)    pO2, Arterial 328.0 (*)    Acid-base deficit 3.0 (*)    All other components within normal limits  BLOOD GAS, ARTERIAL  URINALYSIS, ROUTINE W REFLEX  MICROSCOPIC (NOT AT [facility])  BRAIN NATRIURETIC PEPTIDE  I-STAT TROPOININ, ED    Imaging Review Dg Chest Port 1 View  06/28/2015  CLINICAL DATA:  Dyspnea EXAM: PORTABLE CHEST 1 VIEW COMPARISON:  10/02/2012 FINDINGS: Mild vascular and interstitial fullness. Borderline cardiomegaly. No airspace consolidation. No large effusion. Unremarkable hilar and mediastinal contours. IMPRESSION: Mild vascular and interstitial fullness could represent a mild degree of congestive heart failure. No consolidation. No effusions. Electronically Signed   By: Andreas Newport M.D.   On: 06/28/2015 03:13   I have personally reviewed and evaluated these images and lab results as part of my  medical decision-making.   EKG Interpretation   Date/Time:  Monday Jun 28 2015 02:52:33 EDT Ventricular Rate:  110 PR Interval:  149 QRS Duration: 105 QT Interval:  336 QTC Calculation: [id number] R Axis:   -31 Text Interpretation:  Sinus tachycardia Ventricular premature complex LVH  with secondary repolarization abnormality  st elevation in lead v2 and v3,  increased from prior Otherwise no significant change Confirmed by Renia Mikelson  MD, DANIEL [id number]) on 06/28/2015 2:57:20 AM      CRITICAL CARE Performed by: Deno Etienne, DO Total critical care time: 76 minutes Critical care time was exclusive of separately billable procedures and treating other patients. Critical care was necessary to treat or prevent imminent or life-threatening deterioration. Critical care was time spent personally by me on the following activities: development of treatment plan with patient and/or surrogate as well as nursing, discussions with consultants, evaluation of patient's response to treatment, examination of patient, obtaining history from patient or surrogate, ordering and performing treatments and interventions, ordering and review of laboratory studies, ordering and review of radiographic studies, pulse oximetry and re-evaluation of patient's  condition.  MDM   Final diagnoses:  Acute on chronic congestive heart failure, unspecified congestive heart failure type (HCC)  Syncope and collapse  Palpitations    70 yo F With a chief complaint of sudden shortness breath. This associated with palpitations. Patient then syncopized. Was found down by fire had significant improvement with IPAP. Patient has a history of CHF she thinks maybe this similar to the same. Symptoms improved rather rapidly with nitroglycerin and BiPAP. Requiring very little nitroglycerin to get her blood pressure improved. Initial EKG was had mild ST elevation in leads V2 and V3. This was discussed with Dr. Einar Gip, who feels that these changes are not concerning. Patient was weaned off BiPAP without difficulty. No longer in respiratory distress. Patient seen that this was a limited event that was related to her tachycardia. With her history of CHF concerned that this may be a ventricular arrhythmia that caused her to syncopized. We'll place her in observation for echocardiogram and continuous tele monitoring.  I personally performed the services described in this documentation, which was scribed in my presence. The recorded information has been reviewed and is accurate.   The patients results and plan were reviewed and discussed.   Any x-rays performed were independently reviewed by myself.   Differential diagnosis were considered with the presenting HPI.  Medications  nitroGLYCERIN 50 mg in dextrose 5 % 250 mL (0.2 mg/mL) infusion (10 mcg/min Intravenous New Bag/Given 06/28/15 0305)  furosemide (LASIX) injection 40 mg (40 mg Intravenous Given 06/28/15 0421)    Filed Vitals:   06/28/15 0345 06/28/15 0400 06/28/15 0415 06/28/15 0430  BP: 117/64 117/64 115/61 112/59  Pulse: 85 93 98 96  Temp:      TempSrc:      Resp: 24 24 23 26   Height:      Weight:      SpO2: 100% 98% 93% 94%    Final diagnoses:  Acute on chronic congestive heart failure, unspecified  congestive heart failure type (HCC)  Syncope and collapse  Palpitations    Admission/ observation were discussed with the admitting physician, patient and/or family and they are comfortable with the plan.     Deno Etienne, DO 06/28/15 [phone]

## 2015-06-28 NOTE — Care Management Obs Status (Signed)
[place] NOTIFICATION   Patient Details  Name: [name] MRN: [id number] Date of Birth: 01-17-1946   Medicare Observation Status Notification Given:  Yes    Royston Bake, RN 06/28/2015, 11:34 AM

## 2015-06-28 NOTE — ED Notes (Signed)
EKG given to Dr. Floyd 

## 2015-06-28 NOTE — Progress Notes (Signed)
Echocardiogram 2D Echocardiogram has been performed.  [name] 06/28/2015, 11:23 AM

## 2015-06-28 NOTE — H&P (Signed)
History and Physical    [name] [id number] DOB: 1946-02-07 DOA: 06/28/2015  Referring MD/NP/PA:   PCP: No primary care provider on file.   Outpatient Specialists:none  Patient coming from:  Home  Chief Complaint: SOB and syncope  HPI: [name] is a 70 y.o. female with medical history significant of combined systolic and diastolic congestive heart failure, hypertension, diabetes mellitus, COPD, CAD, left breast cancer, who presents with shortness of breath and syncope.  Patient reports that she ran out of her Lasix in the past 3-4 days. Her body weight increased by about 4 pounds. She has dry cough and shortness of breath, which has worsened last night. She passed out last night. Per EMS, pt was found on the ground and confused. EMS reports SPO2 of 84% on RA. Pt was placed on CPAP and given 2 nitro SL en route. When I saw pt in ED, she is alert, oriented 3. Mental status is normal. she states that she does not have any chest pain, palpitation, unilateral weakness, vision change, hearing loss. No HA or neck pain. She does not have fever, chills. Has nausea, but no vomiting, diarrhea, AP or symptoms of UTI. She states that she still has dry cough and shortness of breath. No tenderness over calf areas.  ED Course: pt was found to have leukocytosis with a WBC 16.7, temperature normal, tachycardia, tachypnea, troponin negative, electrolytes and renal function okay. Pending urinalysis, pending BNP. NTG gtt was started in Ed. Chest x-ray is consistent with pulmonary edema.  Review of Systems:   General: no fevers, chills, no changes in body weight, has fatigue HEENT: no blurry vision, hearing changes or sore throat Pulm: has dyspnea, coughing, no wheezing CV: no chest pain, no palpitations Abd: has nausea, no vomiting, abdominal pain, diarrhea, constipation GU: no dysuria, burning on urination, increased urinary frequency, hematuria  Ext: no leg edema Neuro: no unilateral weakness,  numbness, or tingling, no vision change or hearing loss Skin: no rash MSK: No muscle spasm, no deformity, no limitation of range of movement in spin Heme: No easy bruising.  Travel history: No recent long distant travel.  Allergy: No Known Allergies  Past Medical History  Diagnosis Date  . COPD (chronic obstructive pulmonary disease) ([place])   . Diabetes mellitus   . CHF (congestive heart failure) ([place])   . Cancer [facility])     breast - left   . CAD (coronary artery disease)     Past Surgical History  Procedure Laterality Date  . Breast lumpectomy      lt breast  . Appendectomy      Social History:  reports that she quit smoking about 2 years ago. She has never used smokeless tobacco. She reports that she does not drink alcohol or use illicit drugs.  Family History:  Family History  Problem Relation Age of Onset  . Stroke Father      Prior to Admission medications   Medication Sig Start Date End Date Taking? Authorizing Provider  aspirin 81 MG chewable tablet Chew 1 tablet (81 mg total) by mouth daily. 10/06/12  Yes Eugenie Filler, MD  carvedilol (COREG) 3.125 MG tablet Take 1 tablet (3.125 mg total) by mouth 2 (two) times daily with a meal. 10/06/12  Yes Eugenie Filler, MD  furosemide (LASIX) 40 MG tablet Take 1 tablet (40 mg total) by mouth daily. 10/06/12  Yes Eugenie Filler, MD  lisinopril (PRINIVIL,ZESTRIL) 2.5 MG tablet Take 1 tablet (2.5 mg total) by  mouth daily. 10/06/12  Yes Eugenie Filler, MD  metFORMIN (GLUCOPHAGE) 1000 MG tablet Take 1 tablet (1,000 mg total) by mouth 2 (two) times daily with a meal. 10/06/12  Yes Eugenie Filler, MD  PRESCRIPTION MEDICATION Inject 10-20 Units into the skin 2 (two) times daily. Use 20 units every morning and use 10 units every evening   Yes Historical Provider, MD  furosemide (LASIX) 40 MG tablet Take 1 tablet (40 mg total) by mouth daily. 05/08/11 05/07/12  Erline Hau, MD  glipiZIDE (GLUCOTROL) 10 MG tablet Take  1 tablet (10 mg total) by mouth 2 (two) times daily before a meal. Patient not taking: Reported on 06/28/2015 10/06/12   Eugenie Filler, MD  lisinopril (PRINIVIL,ZESTRIL) 5 MG tablet Take 1 tablet (5 mg total) by mouth daily. Patient not taking: Reported on 06/28/2015 05/08/11 06/28/15  Erline Hau, MD    Physical Exam: Filed Vitals:   06/28/15 0345 06/28/15 0400 06/28/15 0415 06/28/15 0430  BP: 117/64 117/64 115/61 112/59  Pulse: 85 93 98 96  Temp:      TempSrc:      Resp: 24 24 23 26   Height:      Weight:      SpO2: 100% 98% 93% 94%   General: Not in acute distress HEENT:       Eyes: PERRL, EOMI, no scleral icterus.       ENT: No discharge from the ears and nose, no pharynx injection, no tonsillar enlargement.        Neck: positive JVD, no bruit, no mass felt. Heme: No neck lymph node enlargement. Cardiac: S1/S2, RRR, No murmurs, No gallops or rubs. Pulm: has fine crackles bilaterally. No wheezing, rhonchi or rubs. Abd: Soft, nondistended, nontender, no rebound pain, no organomegaly, BS present. GU: No hematuria Ext: No pitting leg edema bilaterally. 2+DP/PT pulse bilaterally. Musculoskeletal: No joint deformities, No joint redness or warmth, no limitation of ROM in spin. Skin: No rashes.  Neuro: Alert, oriented X3, cranial nerves II-XII grossly intact, moves all extremities normally. Muscle strength 5/5 in all extremities, sensation to light touch intact. Knee reflex 1+ bilaterally. Negative Babinski's sign. Normal finger to nose test. Psych: Patient is not psychotic, no suicidal or hemocidal ideation.  Labs on Admission: I have personally reviewed following labs and imaging studies  CBC:  Recent Labs Lab 06/28/15 0251 06/28/15 0307  WBC 16.7*  --   NEUTROABS 10.8*  --   HGB 12.4 13.6  HCT 37.5 40.0  MCV 95.2  --   PLT 319  --    Basic Metabolic Panel:  Recent Labs Lab 06/28/15 0251 06/28/15 0307  NA 138 140  K 3.8 4.2  CL 104 105  CO2 24  --     GLUCOSE 196* 184*  BUN 8 12  CREATININE 0.90 0.70  CALCIUM 9.1  --    GFR: Estimated Creatinine Clearance: 55.7 mL/min (by C-G formula based on Cr of 0.7). Liver Function Tests:  Recent Labs Lab 06/28/15 0251  AST 103*  ALT 97*  ALKPHOS 87  BILITOT 0.6  PROT 6.1*  ALBUMIN 3.2*   No results for input(s): LIPASE, AMYLASE in the last 168 hours. No results for input(s): AMMONIA in the last 168 hours. Coagulation Profile: No results for input(s): INR, PROTIME in the last 168 hours. Cardiac Enzymes: No results for input(s): CKTOTAL, CKMB, CKMBINDEX, TROPONINI in the last 168 hours. BNP (last 3 results) No results for input(s): PROBNP in the last 8760  hours. HbA1C: No results for input(s): HGBA1C in the last 72 hours. CBG: No results for input(s): GLUCAP in the last 168 hours. Lipid Profile: No results for input(s): CHOL, HDL, LDLCALC, TRIG, CHOLHDL, LDLDIRECT in the last 72 hours. Thyroid Function Tests: No results for input(s): TSH, T4TOTAL, FREET4, T3FREE, THYROIDAB in the last 72 hours. Anemia Panel: No results for input(s): VITAMINB12, FOLATE, FERRITIN, TIBC, IRON, RETICCTPCT in the last 72 hours. Urine analysis:    Component Value Date/Time   COLORURINE YELLOW 04/28/2012 1757   APPEARANCEUR CLOUDY* 04/28/2012 1757   LABSPEC 1.036* 04/28/2012 1757   PHURINE 5.0 04/28/2012 1757   GLUCOSEU >1000* 04/28/2012 1757   HGBUR NEGATIVE 04/28/2012 1757   BILIRUBINUR NEGATIVE 04/28/2012 1757   KETONESUR NEGATIVE 04/28/2012 1757   PROTEINUR NEGATIVE 04/28/2012 1757   UROBILINOGEN 0.2 04/28/2012 1757   NITRITE NEGATIVE 04/28/2012 1757   LEUKOCYTESUR MODERATE* 04/28/2012 1757   Sepsis Labs: @LABRCNTIP (procalcitonin:4,lacticidven:4) )No results found for this or any previous visit (from the past 240 hour(s)).   Radiological Exams on Admission: Dg Chest Port 1 View  06/28/2015  CLINICAL DATA:  Dyspnea EXAM: PORTABLE CHEST 1 VIEW COMPARISON:  10/02/2012 FINDINGS: Mild  vascular and interstitial fullness. Borderline cardiomegaly. No airspace consolidation. No large effusion. Unremarkable hilar and mediastinal contours. IMPRESSION: Mild vascular and interstitial fullness could represent a mild degree of congestive heart failure. No consolidation. No effusions. Electronically Signed   By: Andreas Newport M.D.   On: 06/28/2015 03:13     EKG: Independently reviewed. QTC 454, PVC, LAE, LAD, T-wave inversion in lead 1/aVL, anteroseptal infarction pattern  Assessment/Plan Principal Problem:   Acute on chronic combined systolic and diastolic congestive heart failure (HCC) Active Problems:   Diabetes mellitus without complication (HCC)   HYPERTENSION, CONTROLLED   Coronary atherosclerosis   COPD (chronic obstructive pulmonary disease) (HCC)   Acute respiratory failure with hypoxia (HCC)   Syncope   Acute respiratory failure with hypoxia: Patient's shortness of breath are most likely caused by acute on chronic combined systolic and diastolic congestive heart failure. She has not taken her Lasix for several days, body weight increased by 4 pounds plus positive JVD, consistent with CHF exacerbation. Patient does not have wheezing and rhonchi on auscultation, less likely to have COPD exacerbation. Pt does not have chest pain or signs of DVT, less likely to have PE. Pending BNP.  -will place on tele bed for obs -Lasix 40 mg bid by IV -trop x 3 -2d echo -will continue home coreg, ASA and lisnipril -Daily weights -strict I/O's -Low salt diet -d/c NTG gtt.  Syncope: Etiology is not clear. Potential differential diagnoses include severe hypoxia, severe hypercapnia, cardiac arrhythmia. Patient does not have focal neurologic signs, less likely to have TIA/stroke. Currently mental status is normal. -Telemetry monitor -Treated hypoxia as above  DM-II: Last A1c 10.8 on 10/03/12, poorly controled. Patient is taking glipizide and metformin at home -SSI -Check  A1c  HTN: -On IV lasix -Continue Coreg, lisinopril  CAD: No CP. -continue aspirin, Coreg  COPD: Stable. Patient does not have wheezing or rhonchi on auscultation. -When necessary albuterol nebulizer  Leukocytosis: no signs of infection. Likely due to stress induced to demargination. -follow up by CBC -f/u UA     DVT ppx: SQ Lovenox Code Status: Full code Family Communication: Yes, patient's daughter at bed side Disposition Plan:  Anticipate discharge back to previous home environment Consults called:  none Admission status: Obs / tele     Date of Service 06/28/2015  Ivor Costa [facility] Pager [phone]  If 7PM-7AM, please contact night-coverage [email or web address] Password [email or web address] 06/28/2015, 5:09 AM

## 2015-06-28 NOTE — Evaluation (Signed)
Physical Therapy Evaluation and Discharge Patient Details Name: [name] MRN: [id number] DOB: 02-Oct-1945 Today's Date: 06/28/2015   History of Present Illness  Pt is a 70 y/o F who ran out of her Lasix 3-4 days PTA.  She developed a dry cough and SOB followed by a syncopal episode.  Admitting dx: acute respiratory failure w/ hyposixa likely secondary to acute on chronic DHF cause by noncompliance w/ lasix.  Pt's PMH includes COPD, CHF, cancer, CAD.    Clinical Impression  Pt admitted with above diagnosis. She is independent w/ all mobility and no instability noted w/ high level balance activities.  No skilled PT needs identified, PT will sign off.    Follow Up Recommendations No PT follow up    Equipment Recommendations  None recommended by PT    Recommendations for Other Services       Precautions / Restrictions Precautions Precaution Comments: admitted following syncopal event Restrictions Weight Bearing Restrictions: No      Mobility  Bed Mobility               General bed mobility comments: Pt sitting EOB talking on the phone when PT arrived  Transfers Overall transfer level: Independent Equipment used: None Transfers: Sit to/from Stand Sit to Stand: Independent         General transfer comment: No cues or physical assist needed  Ambulation/Gait Ambulation/Gait assistance: Independent Ambulation Distance (Feet): 250 Feet Assistive device: None Gait Pattern/deviations: WFL(Within Functional Limits)   Gait velocity interpretation: at or above normal speed for age/gender General Gait Details: WFL.  No cues or physical assist needed, pt steady.  No instability noted w/ high level balance activities.  Stairs Stairs: Yes Stairs assistance: Independent Stair Management: One rail Left;Alternating pattern;Forwards Number of Stairs: 10 General stair comments: No cues or physical assist needed  Wheelchair Mobility    Modified Rankin (Stroke Patients  Only)       Balance Overall balance assessment: Independent                               Standardized Balance Assessment Standardized Balance Assessment : Dynamic Gait Index   Dynamic Gait Index Level Surface: Normal Change in Gait Speed: Normal Gait with Horizontal Head Turns: Normal Gait with Vertical Head Turns: Normal Gait and Pivot Turn: Normal Step Over Obstacle: Normal Step Around Obstacles: Normal Steps: Mild Impairment (used rail) Total Score: 23       Pertinent Vitals/Pain Pain Assessment: Faces Faces Pain Scale: Hurts a little bit Pain Location: IV site Pain Descriptors / Indicators: Nagging Pain Intervention(s): Limited activity within patient's tolerance;Monitored during session    Home Living Family/patient expects to be discharged to:: Private residence Living Arrangements: Alone Available Help at Discharge: Family;Available PRN/intermittently Type of Home: House Home Access: Stairs to enter Entrance Stairs-Rails: None Entrance Stairs-Number of Steps: 1 Home Layout: Two level;Bed/bath upstairs Home Equipment: None      Prior Function Level of Independence: Independent         Comments: Works as a Social worker at [employer]        Extremity/Trunk Assessment   Upper Extremity Assessment: Overall WFL for tasks assessed           Lower Extremity Assessment: Overall WFL for tasks assessed      Cervical / Trunk Assessment: Normal  Communication   Communication: No difficulties  Cognition Arousal/Alertness: Awake/alert Behavior During Therapy: [facility]  for tasks assessed/performed Overall Cognitive Status: Within Functional Limits for tasks assessed                      General Comments General comments (skin integrity, edema, etc.): HR briefly up to 120 while ambulating but otherwise remains 90-110    Exercises        Assessment/Plan    PT Assessment Patent does not need any further PT services   PT Diagnosis  (s/p fall)   PT Problem List    PT Treatment Interventions     PT Goals (Current goals can be found in the Care Plan section) Acute Rehab PT Goals Patient Stated Goal: to go home and spend time w/ her grandchild PT Goal Formulation: All assessment and education complete, DC therapy    Frequency     Barriers to discharge        Co-evaluation               End of Session Equipment Utilized During Treatment: Gait belt Activity Tolerance: Patient tolerated treatment well Patient left: in bed;with call bell/phone within reach Nurse Communication: Mobility status    Functional Assessment Tool Used: Clinical Judgement Functional Limitation: Mobility: Walking and moving around Mobility: Walking and Moving Around Current Status [phone]): 0 percent impaired, limited or restricted Mobility: Walking and Moving Around Goal Status [phone]): 0 percent impaired, limited or restricted Mobility: Walking and Moving Around Discharge Status [phone]): 0 percent impaired, limited or restricted    Time: [id number] PT Time Calculation (min) (ACUTE ONLY): 11 min   Charges:   PT Evaluation $PT Eval Low Complexity: 1 Procedure     PT G Codes:   PT G-Codes **NOT FOR INPATIENT CLASS** Functional Assessment Tool Used: Clinical Judgement Functional Limitation: Mobility: Walking and moving around Mobility: Walking and Moving Around Current Status [id number]): 0 percent impaired, limited or restricted Mobility: Walking and Moving Around Goal Status [phone]): 0 percent impaired, limited or restricted Mobility: Walking and Moving Around Discharge Status [id number]): 0 percent impaired, limited or restricted    Collie Siad PT, DPT  Pager: [phone] Phone: [phone] 06/28/2015, 2:02 PM

## 2015-06-28 NOTE — Progress Notes (Signed)
Pt had 5 beat run vtach. Asymptomatic, nonsustaining. Notified MD. Will continue to monitor.

## 2015-06-28 NOTE — ED Notes (Signed)
Admitting at bedside 

## 2015-06-28 NOTE — ED Notes (Signed)
Brought via ems from home.  Called 911 for difficulty breathing.  Found by fire and rescue semi conscious on the floor.  O2 sat reported to be 84% prior to applying CPAP.  Up to 97%.  Given zofran 4 mg IV, NTG SL X 2.

## 2015-06-28 NOTE — Progress Notes (Signed)
PROGRESS NOTE  R[place]IO[place]  [id number] DOB: 08/09/1945 DOA: 06/28/2015 PCP: No primary care provider on file. Outpatient Specialists:   Dr. Casandra Doffing, Cardiology  Brief Narrative:   [name] is a 69 y.o. female with medical history significant of combined systolic and diastolic congestive heart failure, hypertension, diabetes mellitus, COPD, CAD, left breast cancer, who presented with shortness of breath and syncope.  Patient reported that she ran out of her Lasix in the past 3-4 days. Her body weight increased by about 4 pounds. She developed dry cough and shortness of breath then passed out the night prior to admission. Per EMS, pt was found on the ground and confused.  SPO2 was 84% on RA. Pt was placed on CPAP and given 2 nitro SL en route.  Pt was found to have leukocytosis with a WBC 16.7, troponin negative, ECG stable.  NTG gtt was started in Ed. Chest x-ray is consistent with pulmonary edema.  She was given IV lasix.    Assessment & Plan:   Principal Problem:   Acute on chronic combined systolic and diastolic congestive heart failure (HCC) Active Problems:   Diabetes mellitus without complication (HCC)   HYPERTENSION, CONTROLLED   Coronary atherosclerosis   COPD (chronic obstructive pulmonary disease) (HCC)   Acute respiratory failure with hypoxia (HCC)   Syncope   Acute respiratory failure with hypoxia likely secondary to acute on chronic combined systolic and diastolic congestive heart failure caused by noncompliance with lasix.   - tele:  SR - Continue Lasix 40 mg bid by IV - initial trop mildly elevated, trend -2d echo -continue home coreg, ASA and lisinopril -Daily weights -strict I/O's -Low salt diet  Syncope: Etiology is not clear. Potential differential diagnoses include severe hypoxia, severe hypercapnia, cardiac arrhythmia. Patient does not have focal neurologic signs, less likely to have TIA/stroke. Currently mental status is normal. -Telemetry:   SR -Treated hypoxia as above  DM-II: Last A1c 10.8 on 10/03/12, poorly controled. Patient is taking glipizide and metformin at home -SSI -Check A1c  HTN: -On IV lasix -Continue Coreg, lisinopril  CAD: No CP. -continue aspirin, Coreg  COPD: Stable. Patient does not have wheezing or rhonchi on auscultation. -When necessary albuterol nebulizer  Leukocytosis: no signs of infection. Likely due to stress induced to demargination. -follow up by CBC - UA neg  Mild elevation of LFTs may be due to EtOH or due to hepatic congestion from heart failure -  Check EtOH level and acute hepatitis panel -  Repeat LFTs tomorrow after diuresis  DVT ppx: SQ Lovenox Code Status: Full code Family Communication: patient alone  Disposition Plan: Anticipate discharge back to previous home environment in a day or two   Consultants:   None  Procedures:  ECHO  Antimicrobials:   none    Subjective: Feeling better already.  SOB markedly improved.  Voiding frequently.  Had some mild nausea the last few days, but currently none.  Denies chest pains, fevers, chills.    Objective: Filed Vitals:   06/28/15 0515 06/28/15 0530 06/28/15 0545 06/28/15 0640  BP: 104/62 94/73 111/61 125/66  Pulse: 90 89 95 90  Temp:    98.4 F (36.9 C)  TempSrc:    Oral  Resp: 23 20 25 20   Height:    5\' 4"  (1.626 m)  Weight:    63.5 kg (139 lb 15.9 oz)  SpO2: 95% 96% 95% 100%    Intake/Output Summary (Last 24 hours) at 06/28/15 1118 Last data filed  at 06/28/15 1005  Gross per 24 hour  Intake    603 ml  Output   1350 ml  Net   -747 ml   Filed Weights   06/28/15 0255 06/28/15 0640  Weight: 58.968 kg (130 lb) 63.5 kg (139 lb 15.9 oz)    Examination:  General exam:  Adult female.  No acute distress.  HEENT:  NCAT, MMM Respiratory system: Clear to auscultation. Respiratory effort normal. Cardiovascular system: S1 & S2 heard, RRR. + JVD to preauricular area at 30o.  Warm extremities Gastrointestinal  system: Abdomen is nondistended, soft and nontender. No organomegaly or masses felt. Normal bowel sounds heard. Skin: No rashes, lesions or ulcers MSK:  Normal tone and bulk, trace bilatearl lower extremity edema Neuro:  Grossly intact Psychiatry: Judgement and insight appear normal.  Somewhat anxious.    Data Reviewed: I have personally reviewed following labs and imaging studies  CBC:  Recent Labs Lab 06/28/15 0251 06/28/15 0307  WBC 16.7*  --   NEUTROABS 10.8*  --   HGB 12.4 13.6  HCT 37.5 40.0  MCV 95.2  --   PLT 319  --    Basic Metabolic Panel:  Recent Labs Lab 06/28/15 0251 06/28/15 0307  NA 138 140  K 3.8 4.2  CL 104 105  CO2 24  --   GLUCOSE 196* 184*  BUN 8 12  CREATININE 0.90 0.70  CALCIUM 9.1  --    GFR: Estimated Creatinine Clearance: 55.7 mL/min (by C-G formula based on Cr of 0.7). Liver Function Tests:  Recent Labs Lab 06/28/15 0251  AST 103*  ALT 97*  ALKPHOS 87  BILITOT 0.6  PROT 6.1*  ALBUMIN 3.2*   No results for input(s): LIPASE, AMYLASE in the last 168 hours. No results for input(s): AMMONIA in the last 168 hours. Coagulation Profile:  Recent Labs Lab 06/28/15 0617  INR 1.08   Cardiac Enzymes:  Recent Labs Lab 06/28/15 0617  TROPONINI 0.07*   BNP (last 3 results) No results for input(s): PROBNP in the last 8760 hours. HbA1C: No results for input(s): HGBA1C in the last 72 hours. CBG:  Recent Labs Lab 06/28/15 0714  GLUCAP 173*   Lipid Profile: No results for input(s): CHOL, HDL, LDLCALC, TRIG, CHOLHDL, LDLDIRECT in the last 72 hours. Thyroid Function Tests: No results for input(s): TSH, T4TOTAL, FREET4, T3FREE, THYROIDAB in the last 72 hours. Anemia Panel: No results for input(s): VITAMINB12, FOLATE, FERRITIN, TIBC, IRON, RETICCTPCT in the last 72 hours. Urine analysis:    Component Value Date/Time   COLORURINE YELLOW 06/28/2015 [place] 06/28/2015 0547   LABSPEC 1.009 06/28/2015 0547    PHURINE 5.0 06/28/2015 0547   GLUCOSEU NEGATIVE 06/28/2015 0547   HGBUR NEGATIVE 06/28/2015 0547   BILIRUBINUR NEGATIVE 06/28/2015 0547   KETONESUR NEGATIVE 06/28/2015 0547   PROTEINUR NEGATIVE 06/28/2015 0547   UROBILINOGEN 0.2 04/28/2012 1757   NITRITE NEGATIVE 06/28/2015 0547   LEUKOCYTESUR NEGATIVE 06/28/2015 0547   Sepsis Labs: @LABRCNTIP (procalcitonin:4,lacticidven:4)  )No results found for this or any previous visit (from the past 240 hour(s)).    Radiology Studies: Dg Chest Port 1 View  06/28/2015  CLINICAL DATA:  Dyspnea EXAM: PORTABLE CHEST 1 VIEW COMPARISON:  10/02/2012 FINDINGS: Mild vascular and interstitial fullness. Borderline cardiomegaly. No airspace consolidation. No large effusion. Unremarkable hilar and mediastinal contours. IMPRESSION: Mild vascular and interstitial fullness could represent a mild degree of congestive heart failure. No consolidation. No effusions. Electronically Signed   By: Valerie Roys.D.  On: 06/28/2015 03:13     Scheduled Meds: . aspirin  81 mg Oral Daily  . carvedilol  3.125 mg Oral BID WC  . enoxaparin (LOVENOX) injection  40 mg Subcutaneous Q24H  . furosemide  40 mg Intravenous Q12H  . insulin aspart  0-5 Units Subcutaneous QHS  . insulin aspart  0-9 Units Subcutaneous TID WC  . lisinopril  2.5 mg Oral Daily  . sodium chloride flush  3 mL Intravenous Q12H   Continuous Infusions:       Time spent: 30 min    Janece Canterbury, MD [facility] Pager [phone]  If 7PM-7AM, please contact night-coverage [email or web address] Password [email or web address] 06/28/2015, 11:18 AM

## 2015-06-28 NOTE — ED Notes (Signed)
Attempted report 

## 2015-06-28 NOTE — ED Notes (Signed)
Attempted to void.  Unable at this time.  States I peed right before I left the house.  No distress noted at this time.  Encouraged to call for assistance as needed.

## 2015-06-28 NOTE — Progress Notes (Signed)
Arrived in the unit around 6;55 am from ED . Orientation given to the unit. Patient verbalizes understanding.

## 2015-06-29 DIAGNOSIS — R55 Syncope and collapse: Secondary | ICD-10-CM

## 2015-06-29 DIAGNOSIS — J410 Simple chronic bronchitis: Secondary | ICD-10-CM

## 2015-06-29 LAB — HEPATITIS PANEL, ACUTE
HCV Ab: <0.1 s/co ratio (ref 0.0–0.9)
HEP A IGM: NEGATIVE
HEP B C IGM: NEGATIVE
Hepatitis B Surface Ag: NEGATIVE

## 2015-06-29 LAB — COMPREHENSIVE METABOLIC PANEL
ALBUMIN: 2.8 g/dL — AB (ref 3.5–5.0)
ALK PHOS: 63 U/L (ref 38–126)
ALT: 56 U/L — ABNORMAL HIGH (ref 14–54)
ANION GAP: 13 (ref 5–15)
AST: 35 U/L (ref 15–41)
BILIRUBIN TOTAL: 0.8 mg/dL (ref 0.3–1.2)
BUN: 16 mg/dL (ref 6–20)
CALCIUM: 8.8 mg/dL — AB (ref 8.9–10.3)
CO2: 25 mmol/L (ref 22–32)
Chloride: 101 mmol/L (ref 101–111)
Creatinine, Ser: 0.85 mg/dL (ref 0.44–1.00)
GFR calc non Af Amer: >60 mL/min (ref >60)
Glucose, Bld: 162 mg/dL — ABNORMAL HIGH (ref 65–99)
POTASSIUM: 4.1 mmol/L (ref 3.5–5.1)
SODIUM: 139 mmol/L (ref 135–145)
TOTAL PROTEIN: 5.4 g/dL — AB (ref 6.5–8.1)

## 2015-06-29 LAB — GLUCOSE, CAPILLARY
GLUCOSE-CAPILLARY: 170 mg/dL — AB (ref 65–99)
GLUCOSE-CAPILLARY: 234 mg/dL — AB (ref 65–99)
Glucose-Capillary: 148 mg/dL — ABNORMAL HIGH (ref 65–99)

## 2015-06-29 LAB — HEMOGLOBIN A1C
HEMOGLOBIN A1C: 10.2 % — AB (ref 4.8–5.6)
Mean Plasma Glucose: 246 mg/dL

## 2015-06-29 LAB — MAGNESIUM: Magnesium: 1.5 mg/dL — ABNORMAL LOW (ref 1.7–2.4)

## 2015-06-29 MED ORDER — ATORVASTATIN CALCIUM 40 MG PO TABS
40.0000 mg | ORAL_TABLET | Freq: Every day | ORAL | Status: DC
Start: 2015-06-29 — End: 2015-12-17

## 2015-06-29 MED ORDER — LIVING WELL WITH DIABETES BOOK
Freq: Once | Status: AC
  Administered 2015-06-29: 14:00:00
  Filled 2015-06-29: qty 1

## 2015-06-29 MED ORDER — INSULIN NPH ISOPHANE & REGULAR (70-30) 100 UNIT/ML ~~LOC~~ SUSP: 5.0000 [IU] | SUBCUTANEOUS | Status: DC

## 2015-06-29 MED ORDER — FUROSEMIDE 40 MG PO TABS: ORAL_TABLET | ORAL | Status: DC

## 2015-06-29 MED ORDER — MAGNESIUM SULFATE 4 GM/100ML IV SOLN
4.0000 g | Freq: Once | INTRAVENOUS | Status: AC
  Administered 2015-06-29: 4 g via INTRAVENOUS
  Filled 2015-06-29: qty 100

## 2015-06-29 MED ORDER — MAGNESIUM OXIDE -MG SUPPLEMENT 400 (240 MG) MG PO TABS
1.0000 | ORAL_TABLET | Freq: Two times a day (BID) | ORAL | Status: DC
Start: 2015-06-29 — End: 2015-12-17

## 2015-06-29 NOTE — Discharge Summary (Signed)
Physician Discharge Summary  [name] [id number] DOB: 30-Jan-1946 DOA: 06/28/2015  PCP: No primary care provider on file.  Admit date: 06/28/2015 Discharge date: 06/29/2015  Recommendations for Outpatient Follow-up:  1.  Follow up with Dr. Aundra Dubin in 2 weeks  2.  Rx for lasix 40mg  twice a day for the next three days followed by lasix 40mg  daily thereafter until follow up  Discharge Diagnoses:  Principal Problem:   Acute on chronic combined systolic and diastolic congestive heart failure (HCC) Active Problems:   Diabetes mellitus without complication (HCC)   HYPERTENSION, CONTROLLED   Coronary atherosclerosis   COPD (chronic obstructive pulmonary disease) ([place])   Acute respiratory failure with hypoxia (HCC)   Syncope   CHF exacerbation ([place])   Discharge Condition: stable, improved  Diet recommendation: low sodium, diabetic  Wt Readings from Last 3 Encounters:  06/29/15 63.64 kg (140 lb 4.8 oz)  10/06/12 63.322 kg (139 lb 9.6 oz)  05/03/12 64.864 kg (143 lb)    History of present illness:   [name] is a 70 y.o. female with medical history significant of combined systolic and diastolic congestive heart failure, hypertension, diabetes mellitus, COPD, CAD, left breast cancer, who presented with shortness of breath and syncope. Patient reported that she ran out of her Lasix in the past 3-4 days. Her body weight increased by about 4 pounds. She developed dry cough and shortness of breath then passed out the night prior to admission. Per EMS, pt was found on the ground and confused. SPO2 was 84% on RA. Pt was placed on CPAP and given 2 nitro SL en route. Pt was found to have leukocytosis with a WBC 16.7, troponin negative, ECG stable. NTG gtt was started in Ed. Chest x-ray is consistent with pulmonary edema and BNP was 454. She was given IV lasix.   Hospital Course:   Acute respiratory failure with hypoxia likely secondary to acute on chronic combined systolic and diastolic  congestive heart failure caused by noncompliance with lasix. She was admitted to telemetry. She remained in sinus rhythm. She was given Lasix 40 mg IV twice a day, and she diuresed 2 L with rapid clinical improvement in her symptoms.  Her echocardiogram demonstrated a mildly to moderately reduced systolic function with ejection fraction of 40-45 percent. She had diffuse hypokinesis with relatively worse inferior hypokinesis. She had evidence of grade 1 diastolic dysfunction. Her right ventricle was mildly dilated and are right ventricular systolic function was somewhat reduced. Her PA peak pressure was 33 mmHg. Her IVC was dilated suggestive of volume overload. She was able to ambulate the halls without dyspnea while maintaining oxygen saturations rated a 95%.  She was counseled on the importance of a low salt diet and compliance with her medications.  She is on beta blocker, ACEI, aspirin, and diuretic.  She should have spironolactone added as her blood pressure and electrolytes/kidney function tolerate.    Syncope.  Differential diagnoses include severe hypoxia, hypercapnia, cardiac arrhythmia. Patient did not have focal neurologic signs and TIA/stroke is felt to be less likely.  She awoke and became more alert after treatment of her respiratory distress with CPAP and oxygen.  She was in NSR during her hospitalization but had a [name] run of 5-beats of V-tach on telemetry.  Her magnesium was low and was supplemented and the case was discussed with cardiology who recommended close outpatient follow up which has been arranged.    DM-II: A1c 10.2, poorly controlled. Patient had hypoglycemia on her previous  diabetes regimen and is now taking NPH 15 units in the morning and 5 units in the evening.  Defer management of her diabetes to her PCP who has been aggressively adjusting medications recently.  HTN, blood pressure stable on carvedilol and lisinopril.    CAD: chest pain free.  She had mild elevation of her  troponins, however, they were flat and were probably the result of her hypoxia in the setting of heart failure.  She does need further ischemic work up and has been referred again to cardiology.  Continue aspirin and beta blocker, and she was started on statin.    COPD: Stable. Respiratory distress and wheeze resolved with diuresis.  She has diminished breath sounds consistent with COPD, but doubt COPD exacerbation at this time.    Leukocytosis: no signs of infection.  She remained afebrile.  Likely due to stress induced to demargination.  UA was negative and CXR was more consistent with pulmonary edema.  Repeat CBC by PCP.    Mild elevation of LFTs may be due to EtOH or due to hepatic congestion from heart failure.  EtOH level was negative and acute hepatitis panel also negative.  Her LFTs improved with diuresis.    Consultants:   None  Procedures:  ECHO  Antimicrobials:   none  Discharge Exam: Filed Vitals:   06/29/15 0610 06/29/15 1203  BP: 126/59 128/62  Pulse: 71 70  Temp: 98 F (36.7 C) 98.5 F (36.9 C)  Resp: 18 18   Filed Vitals:   06/28/15 1830 06/28/15 2054 06/29/15 0610 06/29/15 1203  BP: 118/73 115/56 126/59 128/62  Pulse: 76 74 71 70  Temp: 98.3 F (36.8 C) 98.4 F (36.9 C) 98 F (36.7 C) 98.5 F (36.9 C)  TempSrc: Oral Oral Oral Oral  Resp: 18 20 18 18   Height:      Weight:   63.64 kg (140 lb 4.8 oz)   SpO2: 100% 100% 97% 100%    General exam: Adult female. No acute distress. Ambulating halls and literally dancing in the halls without respiratory distress.  HEENT: NCAT, MMM Respiratory system:  Diminished bilateral breath sounds without wheezes, rales, or rhonchi.  Cardiovascular system: S1 & S2 heard, RRR. JVP resolved. Warm extremities Gastrointestinal system: Abdomen is nondistended, soft and nontender. No organomegaly or masses felt. Normal bowel sounds heard. Skin: No rashes, lesions or ulcers MSK: Normal tone and bulk, no lower extremity  edema Neuro: Grossly intact Psychiatry: Judgement and insight appear normal. Somewhat anxious.  Discharge Instructions      Discharge Instructions    (HEART FAILURE PATIENTS) Call MD:  Anytime you have any of the following symptoms: 1) 3 pound weight gain in 24 hours or 5 pounds in 1 week 2) shortness of breath, with or without a dry hacking cough 3) swelling in the hands, feet or stomach 4) if you have to sleep on extra pillows at night in order to breathe.    Complete by:  As directed      Call MD for:  difficulty breathing, headache or visual disturbances    Complete by:  As directed      Call MD for:  extreme fatigue    Complete by:  As directed      Call MD for:  hives    Complete by:  As directed      Call MD for:  persistant dizziness or light-headedness    Complete by:  As directed      Call MD for:  persistant nausea and vomiting    Complete by:  As directed      Call MD for:  severe uncontrolled pain    Complete by:  As directed      Call MD for:  temperature >100.4    Complete by:  As directed      Diet - low sodium heart healthy    Complete by:  As directed      Discharge instructions    Complete by:  As directed   Take lasix 40mg  twice a day for the next three days, then start lasix 40mg  once daily thereafter.  Please follow up with Dr. Aundra Dubin in 1-2 weeks for reevaluation.     Increase activity slowly    Complete by:  As directed             Medication List    STOP taking these medications        metFORMIN 1000 MG tablet  Commonly known as:  GLUCOPHAGE      TAKE these medications        aspirin 81 MG chewable tablet  Chew 1 tablet (81 mg total) by mouth daily.     atorvastatin 40 MG tablet  Commonly known as:  LIPITOR  Take 1 tablet (40 mg total) by mouth daily.     carvedilol 3.125 MG tablet  Commonly known as:  COREG  Take 1 tablet (3.125 mg total) by mouth 2 (two) times daily with a meal.     furosemide 40 MG tablet  Commonly known as:   LASIX  Take 1 tab twice daily for the next three days, then take 1 tablet daily thereafter     insulin NPH-regular Human (70-30) 100 UNIT/ML injection  Commonly known as:  NOVOLIN 70/30  Inject 5-15 Units into the skin See admin instructions. 15 units in am and 5 units in pm     lisinopril 2.5 MG tablet  Commonly known as:  PRINIVIL,ZESTRIL  Take 1 tablet (2.5 mg total) by mouth daily.     Magnesium Oxide 400 (240 Mg) MG Tabs  Take 1 tablet by mouth 2 (two) times daily.       Follow-up Information    Follow up with Javier Docker, MD. Go on 07/05/2015.   Specialty:  Internal Medicine   Why:  @3 :15pm   Contact information:   [address] [place] [address] [phone]       Follow up with [facility] On 07/07/2015.   Specialty:  Cardiology   Why:  at 930 for post hospital follow up.  Please bring all of your medication to your visit.  The code for parking is 0002.   Contact information:   [address] [id number] [place] [phone]       The results of significant diagnostics from this hospitalization (including imaging, microbiology, ancillary and laboratory) are listed below for reference.    Significant Diagnostic Studies: Dg Chest Port 1 View  06/28/2015  CLINICAL DATA:  Dyspnea EXAM: PORTABLE CHEST 1 VIEW COMPARISON:  10/02/2012 FINDINGS: Mild vascular and interstitial fullness. Borderline cardiomegaly. No airspace consolidation. No large effusion. Unremarkable hilar and mediastinal contours. IMPRESSION: Mild vascular and interstitial fullness could represent a mild degree of congestive heart failure. No consolidation. No effusions. Electronically Signed   By: Andreas Newport M.D.   On: 06/28/2015 03:13    Microbiology: No results found for this or any previous visit (  from the past 240 hour(s)).   Labs: Basic Metabolic Panel:  Recent Labs Lab 06/28/15 0251  06/28/15 0307 06/29/15 0543 06/29/15 0940  NA 138 140 139  --   K 3.8 4.2 4.1  --   CL 104 105 101  --   CO2 24  --  25  --   GLUCOSE 196* 184* 162*  --   BUN 8 12 16   --   CREATININE 0.90 0.70 0.85  --   CALCIUM 9.1  --  8.8*  --   MG  --   --   --  1.5*   Liver Function Tests:  Recent Labs Lab 06/28/15 0251 06/29/15 0543  AST 103* 35  ALT 97* 56*  ALKPHOS 87 63  BILITOT 0.6 0.8  PROT 6.1* 5.4*  ALBUMIN 3.2* 2.8*   No results for input(s): LIPASE, AMYLASE in the last 168 hours. No results for input(s): AMMONIA in the last 168 hours. CBC:  Recent Labs Lab 06/28/15 0251 06/28/15 0307  WBC 16.7*  --   NEUTROABS 10.8*  --   HGB 12.4 13.6  HCT 37.5 40.0  MCV 95.2  --   PLT 319  --    Cardiac Enzymes:  Recent Labs Lab 06/28/15 0617 06/28/15 1038 06/28/15 1645  TROPONINI 0.07* 0.06* 0.05*   BNP: BNP (last 3 results)  Recent Labs  06/28/15 0251  BNP 454.7*    ProBNP (last 3 results) No results for input(s): PROBNP in the last 8760 hours.  CBG:  Recent Labs Lab 06/28/15 1129 06/28/15 1638 06/28/15 2118 06/29/15 0716 06/29/15 1146  GLUCAP 137* 278* 331* 170* 234*    Time coordinating discharge: 35 minutes  Signed:  Britten Seyfried  [facility] 06/29/2015, 3:53 PM

## 2015-06-29 NOTE — Progress Notes (Signed)
Inpatient Diabetes Program Recommendations  AACE/ADA: New Consensus Statement on Inpatient Glycemic Control (2015)  Target Ranges:  Prepandial:   less than 140 mg/dL      Peak postprandial:   less than 180 mg/dL (1-2 hours)      Critically ill patients:  140 - 180 mg/dL   Review of Glycemic Control  Diabetes history: DM 2 Outpatient Diabetes medications: Novolin 70/30 - 20 units in the am and 10 units ac supper, Metformin 1000 bid Current orders for Inpatient glycemic control: sensitive correction tidwc and HS scales  Inpatient Diabetes Program Recommendations:    This DC added carbohydrate modified to heart healthy diet orders with co-sign needed. This may help improve glucose control.\ Please consider increase in correction scale to moderate tidwc. Once eating regularly, might consider addition of 70/30 at 10 units bid here to start. Noted HgbA1C at 10.2%. Will try to see patient to assess control needs and order education as appropriate per hospital setting.  Thank you Rosita Kea, RN, MSN, CDE  Diabetes Inpatient Program Office: [phone] Pager: [phone] 8:00 am to 5:00 pm

## 2015-06-30 ENCOUNTER — Telehealth (HOSPITAL_COMMUNITY): Payer: Self-pay | Admitting: Surgery

## 2015-06-30 NOTE — Telephone Encounter (Signed)
I called patient to insure that she received a map and directions to clinic for upcoming appt with Dr. Aundra Dubin.  I left a message and will attempt to call again.

## 2015-07-06 ENCOUNTER — Inpatient Hospital Stay (HOSPITAL_COMMUNITY): Admission: RE | Admit: 2015-07-06 | Payer: Medicare Other | Source: Ambulatory Visit

## 2015-11-21 HISTORY — PX: TRANSTHORACIC ECHOCARDIOGRAM: SHX275

## 2015-12-16 ENCOUNTER — Encounter (HOSPITAL_COMMUNITY): Payer: Self-pay | Admitting: *Deleted

## 2015-12-16 ENCOUNTER — Emergency Department (HOSPITAL_COMMUNITY): Payer: Medicare Other

## 2015-12-16 ENCOUNTER — Inpatient Hospital Stay (HOSPITAL_COMMUNITY)
Admission: EM | Admit: 2015-12-16 | Discharge: 2015-12-21 | DRG: 286 | Disposition: A | Discharge status: Home / Self Care | Payer: Medicare Other | Attending: Internal Medicine | Admitting: Internal Medicine

## 2015-12-16 DIAGNOSIS — Z794 Long term (current) use of insulin: Secondary | ICD-10-CM

## 2015-12-16 DIAGNOSIS — F17200 Nicotine dependence, unspecified, uncomplicated: Secondary | ICD-10-CM | POA: Diagnosis present

## 2015-12-16 DIAGNOSIS — R911 Solitary pulmonary nodule: Secondary | ICD-10-CM | POA: Diagnosis present

## 2015-12-16 DIAGNOSIS — Z79899 Other long term (current) drug therapy: Secondary | ICD-10-CM

## 2015-12-16 DIAGNOSIS — E785 Hyperlipidemia, unspecified: Secondary | ICD-10-CM

## 2015-12-16 DIAGNOSIS — Z853 Personal history of malignant neoplasm of breast: Secondary | ICD-10-CM

## 2015-12-16 DIAGNOSIS — E7800 Pure hypercholesterolemia, unspecified: Secondary | ICD-10-CM | POA: Diagnosis present

## 2015-12-16 DIAGNOSIS — J441 Chronic obstructive pulmonary disease with (acute) exacerbation: Secondary | ICD-10-CM | POA: Diagnosis present

## 2015-12-16 DIAGNOSIS — I110 Hypertensive heart disease with heart failure: Secondary | ICD-10-CM | POA: Diagnosis not present

## 2015-12-16 DIAGNOSIS — E1169 Type 2 diabetes mellitus with other specified complication: Secondary | ICD-10-CM

## 2015-12-16 DIAGNOSIS — I340 Nonrheumatic mitral (valve) insufficiency: Secondary | ICD-10-CM | POA: Diagnosis present

## 2015-12-16 DIAGNOSIS — I5021 Acute systolic (congestive) heart failure: Secondary | ICD-10-CM

## 2015-12-16 DIAGNOSIS — Z7982 Long term (current) use of aspirin: Secondary | ICD-10-CM

## 2015-12-16 DIAGNOSIS — I509 Heart failure, unspecified: Secondary | ICD-10-CM | POA: Diagnosis not present

## 2015-12-16 DIAGNOSIS — J9601 Acute respiratory failure with hypoxia: Secondary | ICD-10-CM | POA: Diagnosis present

## 2015-12-16 DIAGNOSIS — Z9114 Patient's other noncompliance with medication regimen: Secondary | ICD-10-CM

## 2015-12-16 DIAGNOSIS — I248 Other forms of acute ischemic heart disease: Secondary | ICD-10-CM | POA: Diagnosis present

## 2015-12-16 DIAGNOSIS — I2510 Atherosclerotic heart disease of native coronary artery without angina pectoris: Secondary | ICD-10-CM | POA: Diagnosis present

## 2015-12-16 DIAGNOSIS — I700 Atherosclerosis of aorta: Secondary | ICD-10-CM | POA: Diagnosis present

## 2015-12-16 DIAGNOSIS — R000 Tachycardia, unspecified: Secondary | ICD-10-CM | POA: Diagnosis present

## 2015-12-16 DIAGNOSIS — R778 Other specified abnormalities of plasma proteins: Secondary | ICD-10-CM

## 2015-12-16 DIAGNOSIS — R7989 Other specified abnormal findings of blood chemistry: Secondary | ICD-10-CM

## 2015-12-16 DIAGNOSIS — E119 Type 2 diabetes mellitus without complications: Secondary | ICD-10-CM | POA: Diagnosis present

## 2015-12-16 DIAGNOSIS — Z720 Tobacco use: Secondary | ICD-10-CM

## 2015-12-16 DIAGNOSIS — I5043 Acute on chronic combined systolic (congestive) and diastolic (congestive) heart failure: Secondary | ICD-10-CM | POA: Diagnosis present

## 2015-12-16 DIAGNOSIS — Z8673 Personal history of transient ischemic attack (TIA), and cerebral infarction without residual deficits: Secondary | ICD-10-CM

## 2015-12-16 LAB — CBC WITH DIFFERENTIAL/PLATELET
BASOS ABS: 0.1 10*3/uL (ref 0.0–0.1)
Basophils Relative: 1 %
EOS PCT: 1 %
Eosinophils Absolute: 0.1 10*3/uL (ref 0.0–0.7)
HEMATOCRIT: 36.5 % (ref 36.0–46.0)
HEMOGLOBIN: 12.6 g/dL (ref 12.0–15.0)
LYMPHS ABS: 7.1 10*3/uL — AB (ref 0.7–4.0)
LYMPHS PCT: 61 %
MCH: 33 pg (ref 26.0–34.0)
MCHC: 34.5 g/dL (ref 30.0–36.0)
MCV: 95.5 fL (ref 78.0–100.0)
MONOS PCT: 4 %
Monocytes Absolute: 0.5 10*3/uL (ref 0.1–1.0)
Neutro Abs: 3.9 10*3/uL (ref 1.7–7.7)
Neutrophils Relative %: 33 %
Platelets: 282 10*3/uL (ref 150–400)
RBC: 3.82 MIL/uL — AB (ref 3.87–5.11)
RDW: 13.2 % (ref 11.5–15.5)
WBC: 11.7 10*3/uL — AB (ref 4.0–10.5)

## 2015-12-16 LAB — I-STAT VENOUS BLOOD GAS, ED
ACID-BASE DEFICIT: 5 mmol/L — AB (ref 0.0–2.0)
BICARBONATE: 23 mmol/L (ref 20.0–28.0)
O2 SAT: 83 %
PCO2 VEN: 54.5 mmHg (ref 44.0–60.0)
PO2 VEN: 57 mmHg — AB (ref 32.0–45.0)
TCO2: 25 mmol/L (ref 0–100)
pH, Ven: 7.233 — ABNORMAL LOW (ref 7.250–7.430)

## 2015-12-16 LAB — I-STAT ARTERIAL BLOOD GAS, ED
Acid-base deficit: 6 mmol/L — ABNORMAL HIGH (ref 0.0–2.0)
BICARBONATE: 21.5 mmol/L (ref 20.0–28.0)
O2 Saturation: 93 %
PCO2 ART: 47.8 mmHg (ref 32.0–48.0)
PH ART: 7.262 — AB (ref 7.350–7.450)
PO2 ART: 75 mmHg — AB (ref 83.0–108.0)
Patient temperature: 98.6
TCO2: 23 mmol/L (ref 0–100)

## 2015-12-16 LAB — BASIC METABOLIC PANEL
ANION GAP: 12 (ref 5–15)
BUN: 19 mg/dL (ref 6–20)
CHLORIDE: 102 mmol/L (ref 101–111)
CO2: 21 mmol/L — AB (ref 22–32)
Calcium: 9 mg/dL (ref 8.9–10.3)
Creatinine, Ser: 1.23 mg/dL — ABNORMAL HIGH (ref 0.44–1.00)
GFR calc Af Amer: 50 mL/min — ABNORMAL LOW (ref >60)
GFR, EST NON AFRICAN AMERICAN: 43 mL/min — AB (ref >60)
GLUCOSE: 400 mg/dL — AB (ref 65–99)
POTASSIUM: 4.2 mmol/L (ref 3.5–5.1)
Sodium: 135 mmol/L (ref 135–145)

## 2015-12-16 LAB — I-STAT CHEM 8, ED
BUN: 23 mg/dL — AB (ref 6–20)
CHLORIDE: 102 mmol/L (ref 101–111)
CREATININE: 1.1 mg/dL — AB (ref 0.44–1.00)
Calcium, Ion: 1.12 mmol/L — ABNORMAL LOW (ref 1.15–1.40)
Glucose, Bld: 389 mg/dL — ABNORMAL HIGH (ref 65–99)
HEMATOCRIT: 38 % (ref 36.0–46.0)
Hemoglobin: 12.9 g/dL (ref 12.0–15.0)
POTASSIUM: 4.2 mmol/L (ref 3.5–5.1)
Sodium: 137 mmol/L (ref 135–145)
TCO2: 23 mmol/L (ref 0–100)

## 2015-12-16 LAB — I-STAT CG4 LACTIC ACID, ED: Lactic Acid, Venous: 4.53 mmol/L (ref 0.5–1.9)

## 2015-12-16 LAB — I-STAT TROPONIN, ED: TROPONIN I, POC: 0.01 ng/mL (ref 0.00–0.08)

## 2015-12-16 LAB — MAGNESIUM: Magnesium: 2.1 mg/dL (ref 1.7–2.4)

## 2015-12-16 MED ORDER — NITROGLYCERIN IN D5W 200-5 MCG/ML-% IV SOLN
0.0000 ug/min | Freq: Once | INTRAVENOUS | Status: AC
  Administered 2015-12-16: 80 ug/min via INTRAVENOUS

## 2015-12-16 MED ORDER — NITROGLYCERIN IN D5W 200-5 MCG/ML-% IV SOLN
INTRAVENOUS | Status: AC
  Filled 2015-12-16: qty 250

## 2015-12-16 NOTE — ED Triage Notes (Signed)
Pt to ED by [facility] for respiratory distress. Son called EMS for shortness of breath. Lung sounds absent, initial sats 62%, improved with BVM. EMS attempted cpap, pt became unresponsive. EMS bagged a 5mg  albuterol treatment. Pt responsive on arrival, a&o x 3. Pt placed on bipap on arrival.

## 2015-12-16 NOTE — ED Provider Notes (Signed)
[facility] DEPT Provider Note   CSN: [id number] Arrival date & time: 12/16/15  2303   By signing my name below, I, Dolores Hoose, attest that this documentation has been prepared under the direction and in the presence of Everlene Balls, MD . Electronically Signed: Dolores Hoose, Scribe. 12/16/2015. 11:10 PM.  History   Chief Complaint Chief Complaint  Patient presents with  . Respiratory Distress   LEVEL 5 CAVEAT DUE TO ACUITY OF CONDITION  The history is provided by the EMS personnel and the patient. No language interpreter was used.   HPI Comments:  [name] is a 70 y.o. female with PMHx of CAD, COPD, CHF, and DM  brought in by ambulance, who presents to the Emergency Department complaining of sudden-onset shortness of breath. Initial medic reports hearing rales when listening to her lungs at home. Per EMS, pt was hypoxic at 62% en route. They tried putting her on a CPAP but pt went unresponsive and was bagged for the rest of the way Pt has been given a bipap breathing treatment in hospital with some relief.   Past Medical History:  Diagnosis Date  . CAD (coronary artery disease)   . Cancer [facility])    breast - left   . CHF (congestive heart failure) ([place])   . COPD (chronic obstructive pulmonary disease) ([place])   . Diabetes mellitus     Patient Active Problem List   Diagnosis Date Noted  . COPD (chronic obstructive pulmonary disease) ([place]) 06/28/2015  . Acute respiratory failure with hypoxia ([place]) 06/28/2015  . Syncope 06/28/2015  . Acute on chronic combined systolic and diastolic congestive heart failure ([place]) 06/28/2015  . CHF exacerbation ([place]) 06/28/2015  . 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 Abnormal echocardiogram 10/04/2012  . Abdominal mass, LLQ (left lower quadrant) 05/03/2012  . Respiratory failure ([employer]) 05/05/2011  . COPD exacerbation ([place]) 05/05/2011  . PULMONARY NODULE 08/05/2008  . PALPITATIONS 08/05/2008  . Diabetes mellitus without  complication ([place]) 0000000  . HYPERTENSION, CONTROLLED 07/16/2008  . Coronary atherosclerosis 07/16/2008  . Systolic and diastolic CHF, acute on chronic ([place]) 07/16/2008    Past Surgical History:  Procedure Laterality Date  . APPENDECTOMY    . BREAST LUMPECTOMY     lt breast    OB History    No data available       Home Medications    Prior to Admission medications   Medication Sig Start Date End Date Taking? Authorizing Provider  aspirin 81 MG chewable tablet Chew 1 tablet (81 mg total) by mouth daily. 10/06/12   Eugenie Filler, MD  atorvastatin (LIPITOR) 40 MG tablet Take 1 tablet (40 mg total) by mouth daily. 06/29/15   Janece Canterbury, MD  carvedilol (COREG) 3.125 MG tablet Take 1 tablet (3.125 mg total) by mouth 2 (two) times daily with a meal. 10/06/12   Eugenie Filler, MD  furosemide (LASIX) 40 MG tablet Take 1 tab twice daily for the next three days, then take 1 tablet daily thereafter 06/29/15   Janece Canterbury, MD  insulin NPH-regular Human (NOVOLIN 70/30) (70-30) 100 UNIT/ML injection Inject 5-15 Units into the skin See admin instructions. 15 units in am and 5 units in pm 06/29/15   Janece Canterbury, MD  lisinopril (PRINIVIL,ZESTRIL) 2.5 MG tablet Take 1 tablet (2.5 mg total) by mouth daily. 10/06/12   Eugenie Filler, MD  Magnesium Oxide 400 (240 Mg) MG TABS Take 1 tablet by mouth 2 (two) times daily. 06/29/15   Janece Canterbury, MD  Family History Family History  Problem Relation Age of Onset  . Stroke Father     Social History Social History  Substance Use Topics  . Smoking status: Current Every Day Smoker    Years: 30.00    Last attempt to quit: 09/29/2012  . Smokeless tobacco: Never Used  . Alcohol use No     Allergies   Review of patient's allergies indicates no known allergies.   Review of Systems Review of Systems  Unable to perform ROS: Acuity of condition     Physical Exam Updated Vital Signs BP 117/56   Pulse 109   Temp 97.3 F (36.3  C) (Axillary)   Resp 18   SpO2 97%   Physical Exam  Constitutional: She is oriented to person, place, and time. She appears well-developed and well-nourished. She appears distressed.  HENT:  Head: Normocephalic and atraumatic.  Nose: Nose normal.  Mouth/Throat: Oropharynx is clear and moist. No oropharyngeal exudate.  Eyes: Conjunctivae and EOM are normal. Pupils are equal, round, and reactive to light. No scleral icterus.  Neck: No tracheal deviation present. No thyromegaly present.  Cardiovascular: Regular rhythm and normal heart sounds.  Exam reveals no gallop and no friction rub.   No murmur heard. Tachycardic. Bilateral LE edema.     Pulmonary/Chest: She is in respiratory distress. She has no wheezes. She exhibits no tenderness.  Bipap valve in place. Tachypnic. Bipap valve in place. Increased accessory muscle usage. Expiratory crackles bilaterally.        Abdominal: Soft. Bowel sounds are normal. She exhibits no distension and no mass. There is no tenderness. There is no rebound and no guarding.  Musculoskeletal: Normal range of motion. She exhibits edema. She exhibits no tenderness.  Lymphadenopathy:    She has no cervical adenopathy.  Neurological: She is alert and oriented to person, place, and time. No cranial nerve deficit. She exhibits normal muscle tone.  Skin: Skin is warm and dry. No rash noted. No erythema. No pallor.  Nursing note and vitals reviewed.    ED Treatments / Results  DIAGNOSTIC STUDIES:  Oxygen Saturation is 62% on bipap, low by my interpretation.    COORDINATION OF CARE:  11:16 PM Discussed treatment plan with pt at bedside which includes a breathing treatment and pt agreed to plan.  Labs (all labs ordered are listed, but only abnormal results are displayed) Labs Reviewed  CBC WITH DIFFERENTIAL/PLATELET - Abnormal; Notable for the following:       Result Value   WBC 11.7 (*)    RBC 3.82 (*)    All other components within normal limits    I-STAT CG4 LACTIC ACID, ED - Abnormal; Notable for the following:    Lactic Acid, Venous 4.53 (*)    All other components within normal limits  I-STAT CHEM 8, ED - Abnormal; Notable for the following:    BUN 23 (*)    Creatinine, Ser 1.10 (*)    Glucose, Bld 389 (*)    Calcium, Ion 1.12 (*)    All other components within normal limits  I-STAT VENOUS BLOOD GAS, ED - Abnormal; Notable for the following:    pH, Ven 7.233 (*)    pO2, Ven 57.0 (*)    Acid-base deficit 5.0 (*)    All other components within normal limits  I-STAT ARTERIAL BLOOD GAS, ED - Abnormal; Notable for the following:    pH, Arterial 7.262 (*)    pO2, Arterial 75.0 (*)    Acid-base deficit 6.0 (*)  All other components within normal limits  BASIC METABOLIC PANEL  MAGNESIUM  BRAIN NATRIURETIC PEPTIDE  I-STAT TROPOININ, ED    EKG  EKG Interpretation  Date/Time:  Thursday December 16 2015 23:09:55 EDT Ventricular Rate:  129 PR Interval:    QRS Duration: 112 QT Interval:  307 QTC Calculation: 448 R Axis:     Text Interpretation:  Sinus tachycardia Premature ventricular complexes Interpretation limited secondary to artifact No significant change since last tracing Confirmed by Glynn Octave [phone]) on 12/16/2015 11:17:53 PM      Radiology Dg Chest Port 1 View  Result Date: 12/16/2015 CLINICAL DATA:  Shortness of breath, tachycardia EXAM: PORTABLE CHEST 1 VIEW COMPARISON:  06/28/2015 FINDINGS: AP portable semi-erect view of the chest demonstrates mild cardiomegaly. Diffuse interstitial prominence suggests edema, similar compared to prior study. No effusion. No consolidation. Atherosclerosis of the aorta. No pneumothorax. IMPRESSION: 1. Cardiomegaly with mild central vascular congestion. Diffuse interstitial prominence suggests mild pulmonary edema. No effusions or focal infiltrates. Electronically Signed   By: Donavan Foil M.D.   On: 12/16/2015 23:24    Procedures Procedures (including critical  care time)  Medications Ordered in ED Medications  nitroGLYCERIN 50 mg in dextrose 5 % 250 mL (0.2 mg/mL) infusion (40 mcg/min Intravenous Rate/Dose Change 12/16/15 2335)     Initial Impression / Assessment and Plan / ED Course  I have reviewed the triage vital signs and the nursing notes.  Pertinent labs & imaging results that were available during my care of the patient were reviewed by me and considered in my medical decision making (see chart for details).  Clinical Course    Patient presents to the ED for SOB.  She was severely hypoxic at EMS arrival.  I quickly obtained a bedside US that reveals B lines concerning for CHF exacerbation.  She was placed on bipap and started on nitro drip.  Initial BP 160s/110s, dropped down to 110s/50s.  Labs are still pending.  Will consult cardiology for admission.  12:37 AM CArdiology accepts for admission and further diuresis.  CRITICAL CARE Performed by: Everlene Balls   Total critical care time: 45 minutes - respiratory distress  Critical care time was exclusive of separately billable procedures and treating other patients.  Critical care was necessary to treat or prevent imminent or life-threatening deterioration.  Critical care was time spent personally by me on the following activities: development of treatment plan with patient and/or surrogate as well as nursing, discussions with consultants, evaluation of patient's response to treatment, examination of patient, obtaining history from patient or surrogate, ordering and performing treatments and interventions, ordering and review of laboratory studies, ordering and review of radiographic studies, pulse oximetry and re-evaluation of patient's condition.   Final Clinical Impressions(s) / ED Diagnoses   Final diagnoses:  None    New Prescriptions New Prescriptions   No medications on file     I personally performed the services described in this documentation, which was scribed in  my presence. The recorded information has been reviewed and is accurate.       Everlene Balls, MD 12/17/15 [phone]

## 2015-12-17 ENCOUNTER — Observation Stay (HOSPITAL_COMMUNITY): Payer: Medicare Other

## 2015-12-17 ENCOUNTER — Observation Stay (HOSPITAL_BASED_OUTPATIENT_CLINIC_OR_DEPARTMENT_OTHER): Payer: Medicare Other

## 2015-12-17 DIAGNOSIS — I248 Other forms of acute ischemic heart disease: Secondary | ICD-10-CM | POA: Diagnosis present

## 2015-12-17 DIAGNOSIS — I5043 Acute on chronic combined systolic (congestive) and diastolic (congestive) heart failure: Secondary | ICD-10-CM | POA: Diagnosis present

## 2015-12-17 DIAGNOSIS — I700 Atherosclerosis of aorta: Secondary | ICD-10-CM | POA: Diagnosis present

## 2015-12-17 DIAGNOSIS — I2510 Atherosclerotic heart disease of native coronary artery without angina pectoris: Secondary | ICD-10-CM | POA: Diagnosis present

## 2015-12-17 DIAGNOSIS — J441 Chronic obstructive pulmonary disease with (acute) exacerbation: Secondary | ICD-10-CM | POA: Diagnosis present

## 2015-12-17 DIAGNOSIS — Z7982 Long term (current) use of aspirin: Secondary | ICD-10-CM | POA: Diagnosis not present

## 2015-12-17 DIAGNOSIS — R748 Abnormal levels of other serum enzymes: Secondary | ICD-10-CM

## 2015-12-17 DIAGNOSIS — R000 Tachycardia, unspecified: Secondary | ICD-10-CM | POA: Diagnosis present

## 2015-12-17 DIAGNOSIS — Z9114 Patient's other noncompliance with medication regimen: Secondary | ICD-10-CM | POA: Diagnosis not present

## 2015-12-17 DIAGNOSIS — E7800 Pure hypercholesterolemia, unspecified: Secondary | ICD-10-CM | POA: Diagnosis present

## 2015-12-17 DIAGNOSIS — E785 Hyperlipidemia, unspecified: Secondary | ICD-10-CM

## 2015-12-17 DIAGNOSIS — I5023 Acute on chronic systolic (congestive) heart failure: Secondary | ICD-10-CM

## 2015-12-17 DIAGNOSIS — E119 Type 2 diabetes mellitus without complications: Secondary | ICD-10-CM

## 2015-12-17 DIAGNOSIS — Z794 Long term (current) use of insulin: Secondary | ICD-10-CM | POA: Diagnosis not present

## 2015-12-17 DIAGNOSIS — J9601 Acute respiratory failure with hypoxia: Secondary | ICD-10-CM

## 2015-12-17 DIAGNOSIS — I110 Hypertensive heart disease with heart failure: Secondary | ICD-10-CM | POA: Diagnosis present

## 2015-12-17 DIAGNOSIS — R7989 Other specified abnormal findings of blood chemistry: Secondary | ICD-10-CM

## 2015-12-17 DIAGNOSIS — I509 Heart failure, unspecified: Secondary | ICD-10-CM

## 2015-12-17 DIAGNOSIS — E1169 Type 2 diabetes mellitus with other specified complication: Secondary | ICD-10-CM

## 2015-12-17 DIAGNOSIS — R778 Other specified abnormalities of plasma proteins: Secondary | ICD-10-CM

## 2015-12-17 DIAGNOSIS — I5021 Acute systolic (congestive) heart failure: Secondary | ICD-10-CM

## 2015-12-17 DIAGNOSIS — R911 Solitary pulmonary nodule: Secondary | ICD-10-CM | POA: Diagnosis present

## 2015-12-17 DIAGNOSIS — Z720 Tobacco use: Secondary | ICD-10-CM

## 2015-12-17 DIAGNOSIS — Z853 Personal history of malignant neoplasm of breast: Secondary | ICD-10-CM | POA: Diagnosis not present

## 2015-12-17 DIAGNOSIS — F17200 Nicotine dependence, unspecified, uncomplicated: Secondary | ICD-10-CM | POA: Diagnosis present

## 2015-12-17 DIAGNOSIS — Z79899 Other long term (current) drug therapy: Secondary | ICD-10-CM | POA: Diagnosis not present

## 2015-12-17 DIAGNOSIS — I340 Nonrheumatic mitral (valve) insufficiency: Secondary | ICD-10-CM

## 2015-12-17 DIAGNOSIS — Z8673 Personal history of transient ischemic attack (TIA), and cerebral infarction without residual deficits: Secondary | ICD-10-CM | POA: Diagnosis not present

## 2015-12-17 LAB — CBC
HEMATOCRIT: 33.1 % — AB (ref 36.0–46.0)
HEMOGLOBIN: 11.3 g/dL — AB (ref 12.0–15.0)
MCH: 31.7 pg (ref 26.0–34.0)
MCHC: 34.1 g/dL (ref 30.0–36.0)
MCV: 93 fL (ref 78.0–100.0)
Platelets: 250 10*3/uL (ref 150–400)
RBC: 3.56 MIL/uL — AB (ref 3.87–5.11)
RDW: 12.9 % (ref 11.5–15.5)
WBC: 13.2 10*3/uL — ABNORMAL HIGH (ref 4.0–10.5)

## 2015-12-17 LAB — TROPONIN I
TROPONIN I: 0.13 ng/mL — AB (ref <0.03)
TROPONIN I: 0.11 ng/mL — AB (ref <0.03)
Troponin I: 0.13 ng/mL (ref <0.03)

## 2015-12-17 LAB — GLUCOSE, CAPILLARY
GLUCOSE-CAPILLARY: 173 mg/dL — AB (ref 65–99)
Glucose-Capillary: 250 mg/dL — ABNORMAL HIGH (ref 65–99)
Glucose-Capillary: 148 mg/dL — ABNORMAL HIGH (ref 65–99)
Glucose-Capillary: 149 mg/dL — ABNORMAL HIGH (ref 65–99)
Glucose-Capillary: 186 mg/dL — ABNORMAL HIGH (ref 65–99)

## 2015-12-17 LAB — BRAIN NATRIURETIC PEPTIDE
B NATRIURETIC PEPTIDE 5: 657.7 pg/mL — AB (ref 0.0–100.0)
B Natriuretic Peptide: 480.7 pg/mL — ABNORMAL HIGH (ref 0.0–100.0)

## 2015-12-17 LAB — TSH
TSH: 1.037 u[IU]/mL (ref 0.350–4.500)
TSH: 2.603 u[IU]/mL (ref 0.350–4.500)

## 2015-12-17 LAB — CREATININE, SERUM
Creatinine, Ser: 1.13 mg/dL — ABNORMAL HIGH (ref 0.44–1.00)
GFR calc Af Amer: 55 mL/min — ABNORMAL LOW (ref >60)
GFR calc non Af Amer: 48 mL/min — ABNORMAL LOW (ref >60)

## 2015-12-17 LAB — NM MYOCAR MULTI W/SPECT W/WALL MOTION / EF
CHL CUP MPHR: 149 {beats}/min
CSEPEW: 1 METS
CSEPPHR: 100 {beats}/min
Exercise duration (min): 5 min
Percent HR: 67 %
Rest HR: 80 {beats}/min

## 2015-12-17 LAB — LIPID PANEL
CHOLESTEROL: 181 mg/dL (ref 0–200)
HDL: 64 mg/dL (ref >40)
LDL Cholesterol: 88 mg/dL (ref 0–99)
TRIGLYCERIDES: 143 mg/dL (ref <150)
Total CHOL/HDL Ratio: 2.8 RATIO
VLDL: 29 mg/dL (ref 0–40)

## 2015-12-17 LAB — ECHOCARDIOGRAM COMPLETE
Height: 63 in
WEIGHTICAEL: 2532.64 [oz_av]

## 2015-12-17 LAB — MRSA PCR SCREENING: MRSA by PCR: NEGATIVE

## 2015-12-17 MED ORDER — LISINOPRIL 2.5 MG PO TABS: 2.5000 mg | ORAL_TABLET | Freq: Every day | ORAL | Status: DC

## 2015-12-17 MED ORDER — REGADENOSON 0.4 MG/5ML IV SOLN
INTRAVENOUS | Status: AC
  Filled 2015-12-17: qty 5

## 2015-12-17 MED ORDER — FUROSEMIDE 10 MG/ML IJ SOLN
40.0000 mg | Freq: Two times a day (BID) | INTRAMUSCULAR | Status: DC
  Administered 2015-12-17 – 2015-12-18 (×3): 40 mg via INTRAVENOUS
  Filled 2015-12-17 (×4): qty 4

## 2015-12-17 MED ORDER — ACETAMINOPHEN 325 MG PO TABS: 650.0000 mg | ORAL_TABLET | ORAL | Status: DC | PRN

## 2015-12-17 MED ORDER — ONDANSETRON HCL 4 MG/2ML IJ SOLN
INTRAMUSCULAR | Status: AC
  Filled 2015-12-17: qty 2

## 2015-12-17 MED ORDER — ONDANSETRON HCL 4 MG/2ML IJ SOLN
4.0000 mg | Freq: Once | INTRAMUSCULAR | Status: AC
  Administered 2015-12-17: 4 mg via INTRAVENOUS

## 2015-12-17 MED ORDER — ORAL CARE MOUTH RINSE
15.0000 mL | Freq: Two times a day (BID) | OROMUCOSAL | Status: DC
  Administered 2015-12-17 – 2015-12-20 (×7): 15 mL via OROMUCOSAL

## 2015-12-17 MED ORDER — FUROSEMIDE 10 MG/ML IJ SOLN
80.0000 mg | Freq: Two times a day (BID) | INTRAMUSCULAR | Status: DC
  Administered 2015-12-17: 80 mg via INTRAVENOUS
  Filled 2015-12-17: qty 8

## 2015-12-17 MED ORDER — REGADENOSON 0.4 MG/5ML IV SOLN
0.4000 mg | Freq: Once | INTRAVENOUS | Status: DC
  Filled 2015-12-17: qty 5

## 2015-12-17 MED ORDER — MAGNESIUM OXIDE 400 (241.3 MG) MG PO TABS
400.0000 mg | ORAL_TABLET | Freq: Two times a day (BID) | ORAL | Status: DC
  Administered 2015-12-17 – 2015-12-21 (×9): 400 mg via ORAL
  Filled 2015-12-17 (×9): qty 1

## 2015-12-17 MED ORDER — TECHNETIUM TC 99M TETROFOSMIN IV KIT
30.0000 | PACK | Freq: Once | INTRAVENOUS | Status: AC | PRN
  Administered 2015-12-17: 30 via INTRAVENOUS

## 2015-12-17 MED ORDER — INSULIN ASPART 100 UNIT/ML ~~LOC~~ SOLN
0.0000 [IU] | Freq: Every day | SUBCUTANEOUS | Status: DC
  Administered 2015-12-17: 2 [IU] via SUBCUTANEOUS
  Administered 2015-12-18: 3 [IU] via SUBCUTANEOUS

## 2015-12-17 MED ORDER — ASPIRIN EC 81 MG PO TBEC
81.0000 mg | DELAYED_RELEASE_TABLET | Freq: Every day | ORAL | Status: DC
  Administered 2015-12-17 – 2015-12-19 (×3): 81 mg via ORAL
  Filled 2015-12-17 (×3): qty 1

## 2015-12-17 MED ORDER — ENOXAPARIN SODIUM 40 MG/0.4ML ~~LOC~~ SOLN
40.0000 mg | SUBCUTANEOUS | Status: DC
  Administered 2015-12-17 – 2015-12-21 (×4): 40 mg via SUBCUTANEOUS
  Filled 2015-12-17 (×4): qty 0.4

## 2015-12-17 MED ORDER — ATORVASTATIN CALCIUM 40 MG PO TABS
40.0000 mg | ORAL_TABLET | Freq: Every day | ORAL | Status: DC
  Administered 2015-12-17 – 2015-12-21 (×5): 40 mg via ORAL
  Filled 2015-12-17 (×5): qty 1

## 2015-12-17 MED ORDER — SODIUM CHLORIDE 0.9% FLUSH
3.0000 mL | Freq: Two times a day (BID) | INTRAVENOUS | Status: DC
  Administered 2015-12-17 – 2015-12-21 (×10): 3 mL via INTRAVENOUS

## 2015-12-17 MED ORDER — SODIUM CHLORIDE 0.9 % IV SOLN: 250.0000 mL | INTRAVENOUS | Status: DC | PRN

## 2015-12-17 MED ORDER — ONDANSETRON HCL 4 MG/2ML IJ SOLN: 4.0000 mg | Freq: Four times a day (QID) | INTRAMUSCULAR | Status: DC | PRN

## 2015-12-17 MED ORDER — INSULIN ASPART 100 UNIT/ML ~~LOC~~ SOLN
0.0000 [IU] | Freq: Three times a day (TID) | SUBCUTANEOUS | Status: DC
Start: 2015-12-17 — End: 2015-12-21
  Administered 2015-12-17: 2 [IU] via SUBCUTANEOUS
  Administered 2015-12-17: 3 [IU] via SUBCUTANEOUS
  Administered 2015-12-17: 2 [IU] via SUBCUTANEOUS
  Administered 2015-12-18: 5 [IU] via SUBCUTANEOUS
  Administered 2015-12-18: 11 [IU] via SUBCUTANEOUS
  Administered 2015-12-19 (×2): 8 [IU] via SUBCUTANEOUS
  Administered 2015-12-19: 5 [IU] via SUBCUTANEOUS
  Administered 2015-12-20 (×2): 3 [IU] via SUBCUTANEOUS
  Administered 2015-12-21: 5 [IU] via SUBCUTANEOUS
  Administered 2015-12-21: 3 [IU] via SUBCUTANEOUS

## 2015-12-17 MED ORDER — TECHNETIUM TC 99M TETROFOSMIN IV KIT
10.0000 | PACK | Freq: Once | INTRAVENOUS | Status: AC | PRN
  Administered 2015-12-17: 10 via INTRAVENOUS

## 2015-12-17 MED ORDER — SODIUM CHLORIDE 0.9% FLUSH: 3.0000 mL | INTRAVENOUS | Status: DC | PRN

## 2015-12-17 NOTE — Progress Notes (Signed)
  Echocardiogram 2D Echocardiogram has been performed.  [name] 12/17/2015, 3:01 PM

## 2015-12-17 NOTE — Progress Notes (Signed)
Took patient off NIV at this time. Titrate patient to a 2L [place]. Pt is tolerating it well. Pt appearance is a lot better from initial assessment. Pt is more alert and oriented than initial assessment. MD Claudine Mouton and RN aware, I took patient off NIV. Patient has no distress at this time, told patient if start to feel SOB to please let ED staff know.

## 2015-12-17 NOTE — Progress Notes (Addendum)
Patient Name: [name] Date of Encounter: 12/17/2015  Primary Cardiologist: [facility] ([place]) Problem List     Active Problems:   CHF (congestive heart failure) (HCC)     Subjective   Feels much better today. Less SOB. Feels dizzy sitting up. No chest pain.   Inpatient Medications    Scheduled Meds: . aspirin EC  81 mg Oral Daily  . atorvastatin  40 mg Oral Daily  . enoxaparin (LOVENOX) injection  40 mg Subcutaneous Q24H  . furosemide  40 mg Intravenous BID  . insulin aspart  0-15 Units Subcutaneous TID WC  . insulin aspart  0-5 Units Subcutaneous QHS  . magnesium oxide  400 mg Oral BID  . mouth rinse  15 mL Mouth Rinse BID  . sodium chloride flush  3 mL Intravenous Q12H   Continuous Infusions:   PRN Meds: sodium chloride, acetaminophen, ondansetron (ZOFRAN) IV, sodium chloride flush   Vital Signs    Vitals:   12/17/15 0500 12/17/15 0600 12/17/15 0700 12/17/15 0743  BP: 96/60 (!) 115/54 96/60 96/60   Pulse: 77 (!) 40 (!) 37 (!) 40  Resp: 19 17 18 15   Temp:    97.3 F (36.3 C)  TempSrc:    Oral  SpO2: 100% 100% 100% 100%  Weight:  158 lb 4.6 oz (71.8 kg)    Height:        Intake/Output Summary (Last 24 hours) at 12/17/15 0750 Last data filed at 12/17/15 0300  Gross per 24 hour  Intake                0 ml  Output              200 ml  Net             -200 ml   Filed Weights   12/17/15 0200 12/17/15 0600  Weight: 158 lb 4.6 oz (71.8 kg) 158 lb 4.6 oz (71.8 kg)    Physical Exam    GEN: Well nourished, well developed, in no acute distress.  HEENT: Grossly normal.  Neck: Supple, mild JVD, carotid bruits, or masses. Cardiac: RRR, no murmurs, rubs, or gallops. No clubbing, cyanosis, edema.  Radials/DP/PT 2+ and equal bilaterally.  Respiratory:  Respirations regular and unlabored, bibasilar rales. GI: Soft, nontender, nondistended, BS + x 4. MS: no deformity or atrophy. Skin: warm and dry, no rash. Neuro:  Strength and sensation are intact. Psych:  AAOx3.  Normal affect.  Labs    CBC  Recent Labs  12/16/15 2305 12/16/15 2317 12/17/15 0318  WBC 11.7*  --  13.2*  NEUTROABS 3.9  --   --   HGB 12.6 12.9 11.3*  HCT 36.5 38.0 33.1*  MCV 95.5  --  93.0  PLT 282  --  [id number]   Basic Metabolic Panel  Recent Labs  12/16/15 2305 12/16/15 2317 12/17/15 0318  NA 135 137  --   K 4.2 4.2  --   CL 102 102  --   CO2 21*  --   --   GLUCOSE 400* 389*  --   BUN 19 23*  --   CREATININE 1.23* 1.10* 1.13*  CALCIUM 9.0  --   --   MG 2.1  --   --    Liver Function Tests No results for input(s): AST, ALT, ALKPHOS, BILITOT, PROT, ALBUMIN in the last 72 hours. No results for input(s): LIPASE, AMYLASE in the last 72 hours. Cardiac Enzymes  Recent Labs  12/17/15 0318  TROPONINI 0.13*   BNP Invalid input(s): POCBNP D-Dimer No results for input(s): DDIMER in the last 72 hours. Hemoglobin A1C No results for input(s): HGBA1C in the last 72 hours. Fasting Lipid Panel  Recent Labs  12/17/15 0040  CHOL 181  HDL 64  LDLCALC 88  TRIG 143  CHOLHDL 2.8   Thyroid Function Tests  Recent Labs  12/17/15 0318  TSH 1.037    Telemetry    NSR with PVCs- Personally Reviewed  ECG    Sinus tachy with PVCs. LVH with repolarization abnormality - Personally Reviewed  Radiology    Dg Chest Port 1 View  Result Date: 12/16/2015 CLINICAL DATA:  Shortness of breath, tachycardia EXAM: PORTABLE CHEST 1 VIEW COMPARISON:  06/28/2015 FINDINGS: AP portable semi-erect view of the chest demonstrates mild cardiomegaly. Diffuse interstitial prominence suggests edema, similar compared to prior study. No effusion. No consolidation. Atherosclerosis of the aorta. No pneumothorax. IMPRESSION: 1. Cardiomegaly with mild central vascular congestion. Diffuse interstitial prominence suggests mild pulmonary edema. No effusions or focal infiltrates. Electronically Signed   By: Donavan Foil M.D.   On: 12/16/2015 23:24    Cardiac Studies   Echo  pending  Patient Profile     70 yo BF with history of CHF, DM, HTN, COPD presents with acute worsening SOB. Noted several days of increased weight. Denies chest pain. She had prior cardiac MRI in 2010 showing EF 38%. Echos in 2014 and earlier this year showed EF 40-45%. Patient thinks she had stress test in past but unable to locate in Epic.   Assessment & Plan    1. Acute hypoxic respiratory failure secondary to Acute on chronic systolic CHF. Patient reports compliance with meds. She has been eating out more recently with increased sodium intake. Good response to IV lasix. BP low so will hold Coreg and lisinopril today. Continue lasix 40 mg IV bid. Sodium restriction. Check Echo. She is stable to transfer to telemetry. 2. Elevated troponin 0.13. Suspect demand ischemia with CHF. Need to rule out ischemic heart disease. Will arrange for [facility] today. If abnormal perfusion may need to consider cardiac cath.  3. DM on insulin. Metformin on hold. SSI. At home on 70/30 28 units in am and 10 units in pm. Can resume when taking po. 4. HLD on statin. 5. COPD with ongoing tobacco use. Encourage smoking cessation.  Signed, Peter Martinique, MD  12/17/2015, 7:50 AM

## 2015-12-17 NOTE — Progress Notes (Signed)
Echocardiogram 2D Echocardiogram has been attempted, pt not available at this time.  Aggie Cosier 12/17/2015, 10:44 AM

## 2015-12-17 NOTE — Care Management Obs Status (Signed)
[place] NOTIFICATION   Patient Details  Name: [name] MRN: [id number] Date of Birth: 07-15-1945   Medicare Observation Status Notification Given:  Yes    Royston Bake, RN 12/17/2015, 3:47 PM

## 2015-12-17 NOTE — Consult Note (Signed)
Primary Physician:  Lillia Corporal  Last seen 2 to 3 months ago   Primary Cardiologist: Aundra Dubin remote (2012); J Varanasi in 2014 as consult olnly  Pt presents to ED with SOB   HPI:  Pt is a 70 yo with history of HTN, CAD, COPD, CHF, DM  Pt has not been seen by cardiology since 2014  Seen by Baxter Kail as initial consult only   Noncompliant with meds at the time  Set up for outpt f/u for which she did not follow up    Admitted in May to hospitalist service  with CHF exacerbation and syncopeRan out of her lasix prior   Echo LVEF 40 to 45%  RVEF reduced    Patient says that she has been taking her medicines  Actually increased her Lasix to 2x per day because weight has been increasing  She did not call Dr Alphonzo Grieve to let him know.  Denies CP  Breathing was OK until tonight when she became ver y SOB  EMS called  O2 sat 62%  Placed on CPAP by EMS  Unrsponsive  Bagged until got to ER  Now on BIPAP  PT says breathing is some better      Past Medical History:  Diagnosis Date  . CAD (coronary artery disease)   . Cancer [facility])    breast - left   . CHF (congestive heart failure) ([place])   . COPD (chronic obstructive pulmonary disease) ([place])   . Diabetes mellitus      (Not in a hospital admission)     Infusions:   No Known Allergies  Social History   Social History  . Marital status: Widowed    Spouse name: N/A  . Number of children: N/A  . Years of education: N/A   Occupational History  . Not on file.   Social History Main Topics  . Smoking status: Current Every Day Smoker    Years: 30.00    Last attempt to quit: 09/29/2012  . Smokeless tobacco: Never Used  . Alcohol use No  . Drug use: No  . Sexual activity: Not on file   Other Topics Concern  . Not on file   Social History Narrative  . No narrative on file    Family History  Problem Relation Age of Onset  . Stroke Father     REVIEW OF SYSTEMS:  All systems reviewed  Negative to the above problem except as  noted above.    PHYSICAL EXAM: Vitals:   12/16/15 2345 12/16/15 2352  BP:  (!) 113/43  Pulse: (!) 36   Resp: 21   Temp:      No intake or output data in the 24 hours ending 12/17/15 0001  General:  Well appearing. No respiratory difficulty HEENT: normal Neck: supple. JVP is increased  . Carotids 2+ bilat; no bruits. No lymphadenopathy or thryomegaly appreciated. Cor: PMI nondisplaced. Regular rate & rhythm. No rubs, gallops or murmurs. Lungs: Rales at bases   Abdomen: soft, nontender, nondistended. No hepatosplenomegaly. No bruits or masses. Good bowel sounds. Extremities: no cyanosis, clubbing, rash, edema Neuro: alert & oriented x 3, cranial nerves grossly intact. moves all 4 extremities w/o difficulty. Affect pleasant.  ECG:  ST 110 bpm with occasional PVC  LVH with repolarization abnormalitiy    Results for orders placed or performed during the hospital encounter of 12/16/15 (from the past 24 hour(s))  CBC with Differential/Platelet     Status: Abnormal   Collection Time: 12/16/15  11:05 PM  Result Value Ref Range   WBC 11.7 (H) 4.0 - 10.5 K/uL   RBC 3.82 (L) 3.87 - 5.11 MIL/uL   Hemoglobin 12.6 12.0 - 15.0 g/dL   HCT 36.5 36.0 - 46.0 %   MCV 95.5 78.0 - 100.0 fL   MCH 33.0 26.0 - 34.0 pg   MCHC 34.5 30.0 - 36.0 g/dL   RDW 13.2 11.5 - 15.5 %   Platelets 282 150 - 400 K/uL   Neutrophils Relative % 33 %   Lymphocytes Relative 61 %   Monocytes Relative 4 %   Eosinophils Relative 1 %   Basophils Relative 1 %   Neutro Abs 3.9 1.7 - 7.7 K/uL   Lymphs Abs 7.1 (H) 0.7 - 4.0 K/uL   Monocytes Absolute 0.5 0.1 - 1.0 K/uL   Eosinophils Absolute 0.1 0.0 - 0.7 K/uL   Basophils Absolute 0.1 0.0 - 0.1 K/uL   WBC Morphology ABSOLUTE LYMPHOCYTOSIS   Basic metabolic panel     Status: Abnormal   Collection Time: 12/16/15 11:05 PM  Result Value Ref Range   Sodium 135 135 - 145 mmol/L   Potassium 4.2 3.5 - 5.1 mmol/L   Chloride 102 101 - 111 mmol/L   CO2 21 (L) 22 - 32 mmol/L    Glucose, Bld 400 (H) 65 - 99 mg/dL   BUN 19 6 - 20 mg/dL   Creatinine, Ser 1.23 (H) 0.44 - 1.00 mg/dL   Calcium 9.0 8.9 - 10.3 mg/dL   GFR calc non Af Amer 43 (L) >60 mL/min   GFR calc Af Amer 50 (L) >60 mL/min   Anion gap 12 5 - 15  Magnesium     Status: None   Collection Time: 12/16/15 11:05 PM  Result Value Ref Range   Magnesium 2.1 1.7 - 2.4 mg/dL  I-Stat arterial blood gas, ED     Status: Abnormal   Collection Time: 12/16/15 11:13 PM  Result Value Ref Range   pH, Arterial 7.262 (L) 7.350 - 7.450   pCO2 arterial 47.8 32.0 - 48.0 mmHg   pO2, Arterial 75.0 (L) 83.0 - 108.0 mmHg   Bicarbonate 21.5 20.0 - 28.0 mmol/L   TCO2 23 0 - 100 mmol/L   O2 Saturation 93.0 %   Acid-base deficit 6.0 (H) 0.0 - 2.0 mmol/L   Patient temperature 98.6 F    Sample type ARTERIAL   I-stat troponin, ED     Status: None   Collection Time: 12/16/15 11:16 PM  Result Value Ref Range   Troponin i, poc 0.01 0.00 - 0.08 ng/mL   Comment 3          I-stat chem 8, ed     Status: Abnormal   Collection Time: 12/16/15 11:17 PM  Result Value Ref Range   Sodium 137 135 - 145 mmol/L   Potassium 4.2 3.5 - 5.1 mmol/L   Chloride 102 101 - 111 mmol/L   BUN 23 (H) 6 - 20 mg/dL   Creatinine, Ser 1.10 (H) 0.44 - 1.00 mg/dL   Glucose, Bld 389 (H) 65 - 99 mg/dL   Calcium, Ion 1.12 (L) 1.15 - 1.40 mmol/L   TCO2 23 0 - 100 mmol/L   Hemoglobin 12.9 12.0 - 15.0 g/dL   HCT 38.0 36.0 - 46.0 %  I-Stat CG4 Lactic Acid, ED     Status: Abnormal   Collection Time: 12/16/15 11:18 PM  Result Value Ref Range   Lactic Acid, Venous 4.53 (HH) 0.5 - 1.9 mmol/L  Comment NOTIFIED PHYSICIAN   I-Stat Venous Blood Gas, ED (order at [facility] and [facility] only)     Status: Abnormal   Collection Time: 12/16/15 11:18 PM  Result Value Ref Range   pH, Ven 7.233 (L) 7.250 - 7.430   pCO2, Ven 54.5 44.0 - 60.0 mmHg   pO2, Ven 57.0 (H) 32.0 - 45.0 mmHg   Bicarbonate 23.0 20.0 - 28.0 mmol/L   TCO2 25 0 - 100 mmol/L   O2 Saturation 83.0 %   Acid-base  deficit 5.0 (H) 0.0 - 2.0 mmol/L   Patient temperature HIDE    Sample type VENOUS    Dg Chest Port 1 View  Result Date: 12/16/2015 CLINICAL DATA:  Shortness of breath, tachycardia EXAM: PORTABLE CHEST 1 VIEW COMPARISON:  06/28/2015 FINDINGS: AP portable semi-erect view of the chest demonstrates mild cardiomegaly. Diffuse interstitial prominence suggests edema, similar compared to prior study. No effusion. No consolidation. Atherosclerosis of the aorta. No pneumothorax. IMPRESSION: 1. Cardiomegaly with mild central vascular congestion. Diffuse interstitial prominence suggests mild pulmonary edema. No effusions or focal infiltrates. Electronically Signed   By: Donavan Foil M.D.   On: 12/16/2015 23:24     ASSESSMENT:  70 yo presents with acute SOB and hypoxia  Improving with BiPAP  On exam, evid of some volume increase   Plan to admit to step down  Diurese with IV lasix  Add low dose ACE I  B Blocker when stabilized   COncern for noncompliance  With meds and appt visits  Son says she is very concerned about money  Hasnt come for f/u  Ever to cardiology  Echo in AM  Further work up (noninvasive vs invasive ) depends on results  2  DM  Check Hgb A1 C  Continue insulin  Discuss with pharmacy  3  HL  COntinue statin

## 2015-12-17 NOTE — Progress Notes (Signed)
   [name] presented for a Lexiscan cardiolite today.  No immediate complications.  Stress imaging is pending at this time.  Reino Bellis, NP 12/17/2015, 10:35 AM

## 2015-12-17 NOTE — Progress Notes (Signed)
    I talked with the patient about her Leane Call today results. Given the test results showed decreased EF and perfusion abnormality will plan for cardiac catheterization on Monday. Discussed with Dr. Martinique who saw the patient in rounds today. I have placed on the cath board for Monday.   The patient understands that risks included but are not limited to stroke (1 in 1000), death (1 in 8), kidney failure [usually temporary] (1 in 500), bleeding (1 in 200), allergic reaction [possibly serious] (1 in 200).    Reino Bellis NP-C

## 2015-12-18 LAB — BASIC METABOLIC PANEL
ANION GAP: 8 (ref 5–15)
BUN: 25 mg/dL — ABNORMAL HIGH (ref 6–20)
CHLORIDE: 99 mmol/L — AB (ref 101–111)
CO2: 29 mmol/L (ref 22–32)
Calcium: 9.2 mg/dL (ref 8.9–10.3)
Creatinine, Ser: 1.1 mg/dL — ABNORMAL HIGH (ref 0.44–1.00)
GFR calc Af Amer: 57 mL/min — ABNORMAL LOW (ref >60)
GFR, EST NON AFRICAN AMERICAN: 49 mL/min — AB (ref >60)
GLUCOSE: 174 mg/dL — AB (ref 65–99)
POTASSIUM: 3.8 mmol/L (ref 3.5–5.1)
Sodium: 136 mmol/L (ref 135–145)

## 2015-12-18 LAB — GLUCOSE, CAPILLARY
GLUCOSE-CAPILLARY: 210 mg/dL — AB (ref 65–99)
GLUCOSE-CAPILLARY: 301 mg/dL — AB (ref 65–99)
GLUCOSE-CAPILLARY: 273 mg/dL — AB (ref 65–99)
Glucose-Capillary: 95 mg/dL (ref 65–99)

## 2015-12-18 LAB — HEMOGLOBIN A1C
Hgb A1c MFr Bld: 7.6 % — ABNORMAL HIGH (ref 4.8–5.6)
Mean Plasma Glucose: 171 mg/dL

## 2015-12-18 LAB — PATHOLOGIST SMEAR REVIEW

## 2015-12-18 MED ORDER — FUROSEMIDE 40 MG PO TABS
40.0000 mg | ORAL_TABLET | Freq: Two times a day (BID) | ORAL | Status: DC
  Administered 2015-12-18 – 2015-12-19 (×2): 40 mg via ORAL
  Filled 2015-12-18 (×2): qty 1

## 2015-12-18 NOTE — Progress Notes (Signed)
Patient Name: [name] Date of Encounter: 12/18/2015  Hospital Problem List     Principal Problem:   Acute respiratory failure with hypoxia [facility]) Active Problems:   Type 2 diabetes mellitus without complication, with long-term current use of insulin (HCC)   COPD exacerbation (HCC)   CHF (congestive heart failure) (HCC)   Elevated troponin   Pure hypercholesterolemia   Tobacco abuse    Patient Profile     Pt is a 70 yo with history of HTN, CAD, COPD, CHF, DM  who has not been seen by cardiology since 2014   Noncompliant with meds at the time  Set up for outpt f/u for which she did not keep.   Admitted in May to hospitalist service  with CHF exacerbation and syncope after not running out of meds.  Now presents with increased SOB.  Admitted with respiratory distress.   Subjective   Breathing much better.  No chest pain.   Inpatient Medications    . aspirin EC  81 mg Oral Daily  . atorvastatin  40 mg Oral Daily  . enoxaparin (LOVENOX) injection  40 mg Subcutaneous Q24H  . furosemide  40 mg Intravenous BID  . insulin aspart  0-15 Units Subcutaneous TID WC  . insulin aspart  0-5 Units Subcutaneous QHS  . magnesium oxide  400 mg Oral BID  . mouth rinse  15 mL Mouth Rinse BID  . regadenoson  0.4 mg Intravenous Once  . sodium chloride flush  3 mL Intravenous Q12H    Vital Signs    Vitals:   12/17/15 1655 12/17/15 2008 12/18/15 0102 12/18/15 0517  BP: 112/67 (!) 129/58 (!) 120/49 125/75  Pulse: 80 92 87 80  Resp: 16 16 16 17   Temp: 98.9 F (37.2 C) 97.9 F (36.6 C) 97.8 F (36.6 C) 97.9 F (36.6 C)  TempSrc: Oral Oral Oral Oral  SpO2: 98% 99% 96% 96%  Weight:    154 lb 1.6 oz (69.9 kg)  Height:        Intake/Output Summary (Last 24 hours) at 12/18/15 1112 Last data filed at 12/18/15 0929  Gross per 24 hour  Intake              826 ml  Output             1750 ml  Net             -924 ml   Filed Weights   12/17/15 0200 12/17/15 0600 12/18/15 0517  Weight:  158 lb 4.6 oz (71.8 kg) 158 lb 4.6 oz (71.8 kg) 154 lb 1.6 oz (69.9 kg)    Physical Exam    GEN: Well nourished, well developed, in  no acute distress.  Neck: Supple, no JVD, carotid bruits, or masses. Cardiac: RRR, no rubs, or gallops. No clubbing, cyanosis, no edema.  Radials/DP/PT 2+ and equal bilaterally.  Respiratory:  Respirations  regular and unlabored, clear to auscultation bilaterally. GI: Soft, nontender, nondistended, BS + x 4. Neuro:  Strength and sensation are intact.   Labs    CBC  Recent Labs  12/16/15 2305 12/16/15 2317 12/17/15 0318  WBC 11.7*  --  13.2*  NEUTROABS 3.9  --   --   HGB 12.6 12.9 11.3*  HCT 36.5 38.0 33.1*  MCV 95.5  --  93.0  PLT 282  --  [id number]   Basic Metabolic Panel  Recent Labs  12/16/15 2305 12/16/15 2317 12/17/15 0318 12/18/15 0238  NA 135 137  --  136  K 4.2 4.2  --  3.8  CL 102 102  --  99*  CO2 21*  --   --  29  GLUCOSE 400* 389*  --  174*  BUN 19 23*  --  25*  CREATININE 1.23* 1.10* 1.13* 1.10*  CALCIUM 9.0  --   --  9.2  MG 2.1  --   --   --    Liver Function Tests No results for input(s): AST, ALT, ALKPHOS, BILITOT, PROT, ALBUMIN in the last 72 hours. No results for input(s): LIPASE, AMYLASE in the last 72 hours. Cardiac Enzymes  Recent Labs  12/17/15 0318 12/17/15 1232 12/17/15 1808  TROPONINI 0.13* 0.13* 0.11*   BNP Invalid input(s): POCBNP D-Dimer No results for input(s): DDIMER in the last 72 hours. Hemoglobin A1C  Recent Labs  12/17/15 0318  HGBA1C 7.6*   Fasting Lipid Panel  Recent Labs  12/17/15 0040  CHOL 181  HDL 64  LDLCALC 88  TRIG 143  CHOLHDL 2.8   Thyroid Function Tests  Recent Labs  12/17/15 0318  TSH 1.037     ECG    NA  Radiology    Nm Myocar Multi W/spect W/wall Motion / Ef  Result Date: 12/17/2015 CLINICAL DATA:  Chest pain with shortness of breath. History of diabetes, hypertension, smoking, congestive heart failure and Coronary artery disease. EXAM:  MYOCARDIAL IMAGING WITH SPECT (REST AND PHARMACOLOGIC-STRESS) GATED LEFT VENTRICULAR WALL MOTION STUDY LEFT VENTRICULAR EJECTION FRACTION TECHNIQUE: Standard myocardial SPECT imaging was performed after resting intravenous injection of 10 mCi Tc-45m tetrofosmin. Subsequently, intravenous infusion of Lexiscan was performed under the supervision of the Cardiology staff. At peak effect of the drug, 30 mCi Tc-79m tetrofosmin was injected intravenously and standard myocardial SPECT imaging was performed. Quantitative gated imaging was also performed to evaluate left ventricular wall motion, and estimate left ventricular ejection fraction. COMPARISON:  Portable chest 12/16/2015.  Cardiac MRI 07/07/2008. FINDINGS: Perfusion: There is a large fixed perfusion defect involving the apex and the distal segments of the anterior and lateral walls. No reversible components are identified. Wall Motion: Marked apical hypokinesis. There is lesser hypokinesis involving the remainder of the myocardium. Left Ventricular Ejection Fraction: 38 % End diastolic volume [id number] ml End systolic volume 62 ml IMPRESSION: 1. Large infarct involving the apex, distal anterior and lateral walls. No reversal component to suggest myocardial ischemia. 2. Associated apical hypokinesis. 3. Left ventricular ejection fraction 38% 4. Non invasive risk stratification*: Intermediate *2012 Appropriate Use Criteria for Coronary Revascularization Focused Update: J Am Coll Cardiol. [id number]. http://content.airportbarriers.com.aspx?articleid=[email or web address] Electronically Signed   By: Richardean Sale M.D.   On: 12/17/2015 13:41   Dg Chest Port 1 View  Result Date: 12/16/2015 CLINICAL DATA:  Shortness of breath, tachycardia EXAM: PORTABLE CHEST 1 VIEW COMPARISON:  06/28/2015 FINDINGS: AP portable semi-erect view of the chest demonstrates mild cardiomegaly. Diffuse interstitial prominence suggests edema, similar compared to prior study. No effusion. No  consolidation. Atherosclerosis of the aorta. No pneumothorax. IMPRESSION: 1. Cardiomegaly with mild central vascular congestion. Diffuse interstitial prominence suggests mild pulmonary edema. No effusions or focal infiltrates. Electronically Signed   By: Donavan Foil M.D.   On: 12/16/2015 23:24    Assessment & Plan    ACUTE RESPIRATORY FAILURE:  1500 negative yesterday.  I will change to PO Lasix.  ELEVATED TROPONIN:  EF was lower on stress test.  Plan is for left heart cath on Monday.   The patient understands that risks included but are not limited to stroke (  1 in 1000), death (1 in 53), kidney failure [usually temporary] (1 in 500), bleeding (1 in 200), allergic reaction [possibly serious] (1 in 200).  The patient understands and agrees to proceed.   DM:  Continue current therapy with SSI.   HYPERLIPIDEMIA:    Continue current therapy.   Signed, Minus Breeding, MD  12/18/2015, 11:12 AM

## 2015-12-18 NOTE — Progress Notes (Signed)
Patient refused bed alarm. Will continue to monitor patient. 

## 2015-12-19 LAB — BASIC METABOLIC PANEL
Anion gap: 7 (ref 5–15)
BUN: 28 mg/dL — ABNORMAL HIGH (ref 6–20)
CALCIUM: 9.5 mg/dL (ref 8.9–10.3)
CO2: 30 mmol/L (ref 22–32)
CREATININE: 0.98 mg/dL (ref 0.44–1.00)
Chloride: 100 mmol/L — ABNORMAL LOW (ref 101–111)
GFR, EST NON AFRICAN AMERICAN: 57 mL/min — AB (ref >60)
Glucose, Bld: 201 mg/dL — ABNORMAL HIGH (ref 65–99)
Potassium: 4.3 mmol/L (ref 3.5–5.1)
Sodium: 137 mmol/L (ref 135–145)

## 2015-12-19 LAB — GLUCOSE, CAPILLARY
GLUCOSE-CAPILLARY: 255 mg/dL — AB (ref 65–99)
GLUCOSE-CAPILLARY: 90 mg/dL (ref 65–99)
Glucose-Capillary: 238 mg/dL — ABNORMAL HIGH (ref 65–99)
Glucose-Capillary: 258 mg/dL — ABNORMAL HIGH (ref 65–99)

## 2015-12-19 MED ORDER — SODIUM CHLORIDE 0.9 % IV SOLN
INTRAVENOUS | Status: DC
  Administered 2015-12-20: 06:00:00 via INTRAVENOUS

## 2015-12-19 MED ORDER — ASPIRIN 81 MG PO CHEW: 81.0000 mg | CHEWABLE_TABLET | ORAL | Status: DC

## 2015-12-19 MED ORDER — INSULIN NPH (HUMAN) (ISOPHANE) 100 UNIT/ML ~~LOC~~ SUSP
10.0000 [IU] | Freq: Every day | SUBCUTANEOUS | Status: DC
  Administered 2015-12-21: 10 [IU] via SUBCUTANEOUS

## 2015-12-19 MED ORDER — SODIUM CHLORIDE 0.9% FLUSH: 3.0000 mL | INTRAVENOUS | Status: DC | PRN

## 2015-12-19 MED ORDER — SODIUM CHLORIDE 0.9% FLUSH
3.0000 mL | Freq: Two times a day (BID) | INTRAVENOUS | Status: DC
  Administered 2015-12-19: 3 mL via INTRAVENOUS

## 2015-12-19 MED ORDER — ASPIRIN EC 81 MG PO TBEC
81.0000 mg | DELAYED_RELEASE_TABLET | Freq: Every day | ORAL | Status: DC
  Filled 2015-12-19: qty 1

## 2015-12-19 MED ORDER — SODIUM CHLORIDE 0.9 % IV SOLN: 250.0000 mL | INTRAVENOUS | Status: DC | PRN

## 2015-12-19 MED ORDER — INSULIN NPH (HUMAN) (ISOPHANE) 100 UNIT/ML ~~LOC~~ SUSP
5.0000 [IU] | Freq: Every day | SUBCUTANEOUS | Status: DC
  Administered 2015-12-19 – 2015-12-20 (×2): 5 [IU] via SUBCUTANEOUS
  Filled 2015-12-19: qty 10

## 2015-12-19 MED ORDER — FUROSEMIDE 40 MG PO TABS: 40.0000 mg | ORAL_TABLET | Freq: Every day | ORAL | Status: DC

## 2015-12-19 NOTE — Progress Notes (Signed)
Patient Name: [name] Date of Encounter: 12/19/2015  Hospital Problem List     Principal Problem:   Acute respiratory failure with hypoxia [facility]) Active Problems:   Type 2 diabetes mellitus without complication, with long-term current use of insulin (HCC)   COPD exacerbation (HCC)   CHF (congestive heart failure) (HCC)   Elevated troponin   Pure hypercholesterolemia   Tobacco abuse    Patient Profile     Pt is a 70 yo with history of HTN, CAD, COPD, CHF, DM  who has not been seen by cardiology since 2014   Noncompliant with meds at the time  Set up for outpt f/u for which she did not keep.   Admitted in May to hospitalist service  with CHF exacerbation and syncope after not running out of meds.  Now presents with increased SOB.  Admitted with respiratory distress.   Subjective   Breathing much better.  No chest pain.   Inpatient Medications    . aspirin EC  81 mg Oral Daily  . atorvastatin  40 mg Oral Daily  . enoxaparin (LOVENOX) injection  40 mg Subcutaneous Q24H  . furosemide  40 mg Oral BID  . insulin aspart  0-15 Units Subcutaneous TID WC  . insulin aspart  0-5 Units Subcutaneous QHS  . magnesium oxide  400 mg Oral BID  . mouth rinse  15 mL Mouth Rinse BID  . regadenoson  0.4 mg Intravenous Once  . sodium chloride flush  3 mL Intravenous Q12H    Vital Signs    Vitals:   12/18/15 1130 12/18/15 2017 12/19/15 0612 12/19/15 1115  BP: 134/89 (!) 141/79 136/85 137/65  Pulse: 65 73 79 77  Resp: 18 18 18 18   Temp: 98.2 F (36.8 C) 98.4 F (36.9 C) 98.1 F (36.7 C) 98 F (36.7 C)  TempSrc: Oral Oral Oral Oral  SpO2: 98% 97% 97% 100%  Weight:   153 lb 3.2 oz (69.5 kg)   Height:        Intake/Output Summary (Last 24 hours) at 12/19/15 1146 Last data filed at 12/19/15 1019  Gross per 24 hour  Intake             1143 ml  Output             1925 ml  Net             -782 ml   Filed Weights   12/17/15 0600 12/18/15 0517 12/19/15 0612  Weight: 158 lb 4.6  oz (71.8 kg) 154 lb 1.6 oz (69.9 kg) 153 lb 3.2 oz (69.5 kg)    Physical Exam    GEN: Well nourished, well developed, in  no acute distress.  Neck: Supple, no JVD, carotid bruits, or masses. Cardiac: RRR, no rubs, or gallops. No clubbing, cyanosis, no edema.  Radials/DP/PT 2+ and equal bilaterally.  Respiratory:  Respirations  regular and unlabored, clear to auscultation bilaterally. GI: Soft, nontender, nondistended, BS + x 4. Neuro:  Strength and sensation are intact.   Labs    CBC  Recent Labs  12/16/15 2305 12/16/15 2317 12/17/15 0318  WBC 11.7*  --  13.2*  NEUTROABS 3.9  --   --   HGB 12.6 12.9 11.3*  HCT 36.5 38.0 33.1*  MCV 95.5  --  93.0  PLT 282  --  [id number]   Basic Metabolic Panel  Recent Labs  12/16/15 2305  12/18/15 0238 12/19/15 0449  NA 135  < > 136  137  K 4.2  < > 3.8 4.3  CL 102  < > 99* 100*  CO2 21*  --  29 30  GLUCOSE 400*  < > 174* 201*  BUN 19  < > 25* 28*  CREATININE 1.23*  < > 1.10* 0.98  CALCIUM 9.0  --  9.2 9.5  MG 2.1  --   --   --   < > = values in this interval not displayed. Liver Function Tests No results for input(s): AST, ALT, ALKPHOS, BILITOT, PROT, ALBUMIN in the last 72 hours. No results for input(s): LIPASE, AMYLASE in the last 72 hours. Cardiac Enzymes  Recent Labs  12/17/15 0318 12/17/15 1232 12/17/15 1808  TROPONINI 0.13* 0.13* 0.11*   BNP Invalid input(s): POCBNP D-Dimer No results for input(s): DDIMER in the last 72 hours. Hemoglobin A1C  Recent Labs  12/17/15 0318  HGBA1C 7.6*   Fasting Lipid Panel  Recent Labs  12/17/15 0040  CHOL 181  HDL 64  LDLCALC 88  TRIG 143  CHOLHDL 2.8   Thyroid Function Tests  Recent Labs  12/17/15 0318  TSH 1.037     ECG    NA  Radiology    Nm Myocar Multi W/spect W/wall Motion / Ef  Result Date: 12/17/2015 CLINICAL DATA:  Chest pain with shortness of breath. History of diabetes, hypertension, smoking, congestive heart failure and Coronary artery disease.  EXAM: MYOCARDIAL IMAGING WITH SPECT (REST AND PHARMACOLOGIC-STRESS) GATED LEFT VENTRICULAR WALL MOTION STUDY LEFT VENTRICULAR EJECTION FRACTION TECHNIQUE: Standard myocardial SPECT imaging was performed after resting intravenous injection of 10 mCi Tc-60m tetrofosmin. Subsequently, intravenous infusion of Lexiscan was performed under the supervision of the Cardiology staff. At peak effect of the drug, 30 mCi Tc-78m tetrofosmin was injected intravenously and standard myocardial SPECT imaging was performed. Quantitative gated imaging was also performed to evaluate left ventricular wall motion, and estimate left ventricular ejection fraction. COMPARISON:  Portable chest 12/16/2015.  Cardiac MRI 07/07/2008. FINDINGS: Perfusion: There is a large fixed perfusion defect involving the apex and the distal segments of the anterior and lateral walls. No reversible components are identified. Wall Motion: Marked apical hypokinesis. There is lesser hypokinesis involving the remainder of the myocardium. Left Ventricular Ejection Fraction: 38 % End diastolic volume 123XX123 ml End systolic volume 62 ml IMPRESSION: 1. Large infarct involving the apex, distal anterior and lateral walls. No reversal component to suggest myocardial ischemia. 2. Associated apical hypokinesis. 3. Left ventricular ejection fraction 38% 4. Non invasive risk stratification*: Intermediate *2012 Appropriate Use Criteria for Coronary Revascularization Focused Update: J Am Coll Cardiol. [id number]. http://content.[email or web address].aspx?[email or web address] Electronically Signed   By: Richardean Sale M.D.   On: 12/17/2015 13:41   Dg Chest Port 1 View  Result Date: 12/16/2015 CLINICAL DATA:  Shortness of breath, tachycardia EXAM: PORTABLE CHEST 1 VIEW COMPARISON:  06/28/2015 FINDINGS: AP portable semi-erect view of the chest demonstrates mild cardiomegaly. Diffuse interstitial prominence suggests edema, similar compared to prior study. No effusion. No  consolidation. Atherosclerosis of the aorta. No pneumothorax. IMPRESSION: 1. Cardiomegaly with mild central vascular congestion. Diffuse interstitial prominence suggests mild pulmonary edema. No effusions or focal infiltrates. Electronically Signed   By: Donavan Foil M.D.   On: 12/16/2015 23:24    Assessment & Plan    ACUTE RESPIRATORY FAILURE:  1000 negative yesterday.  I changed to PO Lasix.  I will put this on hold pending the cath.  Will need to be resumed after the cath prior to discharge most likely.   ELEVATED TROPONIN:  EF was lower on stress test.  Plan is for left heart cath on Monday.     DM:  Continue current therapy with SSI.   She was not started on her out patient insulin.  I will start this at a lower dose.    HYPERLIPIDEMIA:    Continue current therapy.   Signed, Minus Breeding, MD  12/19/2015, 11:46 AM

## 2015-12-20 ENCOUNTER — Encounter (HOSPITAL_COMMUNITY): Payer: Self-pay | Admitting: Internal Medicine

## 2015-12-20 ENCOUNTER — Encounter (HOSPITAL_COMMUNITY)
Admission: EM | Disposition: A | Discharge status: Home / Self Care | Payer: Self-pay | Source: Home / Self Care | Attending: Internal Medicine

## 2015-12-20 DIAGNOSIS — I2510 Atherosclerotic heart disease of native coronary artery without angina pectoris: Secondary | ICD-10-CM

## 2015-12-20 HISTORY — PX: CARDIAC CATHETERIZATION: SHX172

## 2015-12-20 LAB — PROTIME-INR
INR: 1.04
Prothrombin Time: 13.6 seconds (ref 11.4–15.2)

## 2015-12-20 LAB — GLUCOSE, CAPILLARY
GLUCOSE-CAPILLARY: 191 mg/dL — AB (ref 65–99)
Glucose-Capillary: 214 mg/dL — ABNORMAL HIGH (ref 65–99)
Glucose-Capillary: 197 mg/dL — ABNORMAL HIGH (ref 65–99)
Glucose-Capillary: 191 mg/dL — ABNORMAL HIGH (ref 65–99)

## 2015-12-20 LAB — BASIC METABOLIC PANEL
ANION GAP: 6 (ref 5–15)
BUN: 27 mg/dL — ABNORMAL HIGH (ref 6–20)
CALCIUM: 9.3 mg/dL (ref 8.9–10.3)
CO2: 29 mmol/L (ref 22–32)
Chloride: 102 mmol/L (ref 101–111)
Creatinine, Ser: 0.9 mg/dL (ref 0.44–1.00)
GLUCOSE: 164 mg/dL — AB (ref 65–99)
POTASSIUM: 4.3 mmol/L (ref 3.5–5.1)
Sodium: 137 mmol/L (ref 135–145)

## 2015-12-20 SURGERY — LEFT HEART CATH AND CORONARY ANGIOGRAPHY
Anesthesia: LOCAL

## 2015-12-20 MED ORDER — IOPAMIDOL (ISOVUE-370) INJECTION 76%
INTRAVENOUS | Status: DC | PRN
  Administered 2015-12-20: 35 mL via INTRA_ARTERIAL

## 2015-12-20 MED ORDER — LIDOCAINE HCL (PF) 1 % IJ SOLN
INTRAMUSCULAR | Status: DC | PRN
  Administered 2015-12-20: 2 mL

## 2015-12-20 MED ORDER — MIDAZOLAM HCL 2 MG/2ML IJ SOLN
INTRAMUSCULAR | Status: AC
  Filled 2015-12-20: qty 2

## 2015-12-20 MED ORDER — SODIUM CHLORIDE 0.9 % IV SOLN: 250.0000 mL | INTRAVENOUS | Status: DC | PRN

## 2015-12-20 MED ORDER — FUROSEMIDE 40 MG PO TABS
40.0000 mg | ORAL_TABLET | Freq: Every day | ORAL | Status: DC
  Administered 2015-12-21: 40 mg via ORAL
  Filled 2015-12-20: qty 1

## 2015-12-20 MED ORDER — VERAPAMIL HCL 2.5 MG/ML IV SOLN
INTRAVENOUS | Status: AC
  Filled 2015-12-20: qty 2

## 2015-12-20 MED ORDER — SODIUM CHLORIDE 0.9% FLUSH
3.0000 mL | Freq: Two times a day (BID) | INTRAVENOUS | Status: DC
Start: 2015-12-20 — End: 2015-12-21
  Administered 2015-12-20 – 2015-12-21 (×3): 3 mL via INTRAVENOUS

## 2015-12-20 MED ORDER — IOPAMIDOL (ISOVUE-370) INJECTION 76%
INTRAVENOUS | Status: AC
  Filled 2015-12-20: qty 100

## 2015-12-20 MED ORDER — SODIUM CHLORIDE 0.9 % IV SOLN
INTRAVENOUS | Status: AC
  Administered 2015-12-20: 11:00:00 via INTRAVENOUS

## 2015-12-20 MED ORDER — FENTANYL CITRATE (PF) 100 MCG/2ML IJ SOLN
INTRAMUSCULAR | Status: AC
  Filled 2015-12-20: qty 2

## 2015-12-20 MED ORDER — HEPARIN SODIUM (PORCINE) 1000 UNIT/ML IJ SOLN
INTRAMUSCULAR | Status: AC
  Filled 2015-12-20: qty 1

## 2015-12-20 MED ORDER — HEPARIN SODIUM (PORCINE) 1000 UNIT/ML IJ SOLN
INTRAMUSCULAR | Status: DC | PRN
Start: 2015-12-20 — End: 2015-12-20
  Administered 2015-12-20: 3500 [IU] via INTRAVENOUS

## 2015-12-20 MED ORDER — FENTANYL CITRATE (PF) 100 MCG/2ML IJ SOLN
INTRAMUSCULAR | Status: DC | PRN
  Administered 2015-12-20: 50 ug via INTRAVENOUS

## 2015-12-20 MED ORDER — SODIUM CHLORIDE 0.9% FLUSH: 3.0000 mL | INTRAVENOUS | Status: DC | PRN

## 2015-12-20 MED ORDER — MIDAZOLAM HCL 2 MG/2ML IJ SOLN
INTRAMUSCULAR | Status: DC | PRN
  Administered 2015-12-20: 1 mg via INTRAVENOUS

## 2015-12-20 MED ORDER — VERAPAMIL HCL 2.5 MG/ML IV SOLN
INTRAVENOUS | Status: DC | PRN
  Administered 2015-12-20: 09:00:00 via INTRA_ARTERIAL

## 2015-12-20 MED ORDER — HEPARIN (PORCINE) IN NACL 2-0.9 UNIT/ML-% IJ SOLN
INTRAMUSCULAR | Status: DC | PRN
  Administered 2015-12-20: 1000 mL

## 2015-12-20 MED ORDER — HEPARIN (PORCINE) IN NACL 2-0.9 UNIT/ML-% IJ SOLN
INTRAMUSCULAR | Status: AC
  Filled 2015-12-20: qty 1000

## 2015-12-20 MED ORDER — LIDOCAINE HCL (PF) 1 % IJ SOLN
INTRAMUSCULAR | Status: AC
  Filled 2015-12-20: qty 30

## 2015-12-20 MED ORDER — ASPIRIN EC 81 MG PO TBEC
81.0000 mg | DELAYED_RELEASE_TABLET | Freq: Every day | ORAL | Status: DC
  Administered 2015-12-20 – 2015-12-21 (×2): 81 mg via ORAL
  Filled 2015-12-20: qty 1

## 2015-12-20 SURGICAL SUPPLY — 10 items
CATH INFINITI 5 FR JL3.5 (CATHETERS) ×1 IMPLANT
CATH INFINITI JR4 5F (CATHETERS) ×1 IMPLANT
DEVICE RAD COMP TR BAND LRG (VASCULAR PRODUCTS) ×1 IMPLANT
GLIDESHEATH SLEND SS 6F .021 (SHEATH) ×1 IMPLANT
KIT HEART LEFT (KITS) ×2 IMPLANT
PACK CARDIAC CATHETERIZATION (CUSTOM PROCEDURE TRAY) ×2 IMPLANT
TRANSDUCER W/STOPCOCK (MISCELLANEOUS) ×2 IMPLANT
TUBING CIL FLEX 10 FLL-RA (TUBING) ×2 IMPLANT
WIRE HI TORQ VERSACORE-J 145CM (WIRE) ×1 IMPLANT
WIRE SAFE-T 1.5MM-J .035X260CM (WIRE) ×1 IMPLANT

## 2015-12-20 NOTE — Interval H&P Note (Signed)
History and Physical Interval Note:  12/20/2015 8:50 AM  [name]  has presented today for surgery, with the diagnosis of chest pain and heart failure. The various methods of treatment have been discussed with the patient and family. After consideration of risks, benefits and other options for treatment, the patient has consented to  Procedure(s): Left Heart Cath and Coronary Angiography (N/A) as a surgical intervention .  The patient's history has been reviewed, patient examined, no change in status, stable for surgery.  I have reviewed the patient's chart and labs.  Questions were answered to the patient's satisfaction.    Cath Lab Visit (complete for each Cath Lab visit)  Clinical Evaluation Leading to the Procedure:   ACS: No.  Non-ACS:    Anginal Classification: CCS IV (shortness of breath)  Anti-ischemic medical therapy: Minimal Therapy (1 class of medications)  Non-Invasive Test Results: No non-invasive testing performed (moderately reduced LVEF by resting echo)  Prior CABG: No previous CABG  [name]

## 2015-12-20 NOTE — Care Management Note (Signed)
Case Management Note  Patient Details  Name: [name] MRN: [id number] Date of Birth: 1945/03/18  Subjective/Objective:     Admitted with Acute Respiratory Failure               Action/Plan: Patient is independent of her ADL's continue to work 30 hrs a week at [employer]; Goes to the [employer] for primary care; [employer] with Medicare part A; patient stated that she is exploring Medicare part B and D; CM talked to the patient of the importance of having medical / prescription drug coverage; pt acknowledged understanding. No needs identified. CM will continue to follow for dcp  Expected Discharge Date:    POSSIBLY 12/21/2015              Expected Discharge Plan:  Home/Self Care  Discharge planning Services  CM Consult  Post Acute Care Choice:    Choice offered to:  Patient  Status of Service:  In process, will continue to follow  Sherrilyn Rist [id number] 12/20/2015, 10:48 AM

## 2015-12-20 NOTE — H&P (View-Only) (Signed)
Patient Name: [name] Date of Encounter: 12/19/2015  Hospital Problem List     Principal Problem:   Acute respiratory failure with hypoxia [facility] Active Problems:   Type 2 diabetes mellitus without complication, with long-term current use of insulin (HCC)   COPD exacerbation (HCC)   CHF (congestive heart failure) (HCC)   Elevated troponin   Pure hypercholesterolemia   Tobacco abuse    Patient Profile     Pt is a 70 yo with history of HTN, CAD, COPD, CHF, DM  who has not been seen by cardiology since 2014   Noncompliant with meds at the time  Set up for outpt f/u for which she did not keep.   Admitted in May to hospitalist service  with CHF exacerbation and syncope after not running out of meds.  Now presents with increased SOB.  Admitted with respiratory distress.   Subjective   Breathing much better.  No chest pain.   Inpatient Medications    . aspirin EC  81 mg Oral Daily  . atorvastatin  40 mg Oral Daily  . enoxaparin (LOVENOX) injection  40 mg Subcutaneous Q24H  . furosemide  40 mg Oral BID  . insulin aspart  0-15 Units Subcutaneous TID WC  . insulin aspart  0-5 Units Subcutaneous QHS  . magnesium oxide  400 mg Oral BID  . mouth rinse  15 mL Mouth Rinse BID  . regadenoson  0.4 mg Intravenous Once  . sodium chloride flush  3 mL Intravenous Q12H    Vital Signs    Vitals:   12/18/15 1130 12/18/15 2017 12/19/15 0612 12/19/15 1115  BP: 134/89 (!) 141/79 136/85 137/65  Pulse: 65 73 79 77  Resp: 18 18 18 18   Temp: 98.2 F (36.8 C) 98.4 F (36.9 C) 98.1 F (36.7 C) 98 F (36.7 C)  TempSrc: Oral Oral Oral Oral  SpO2: 98% 97% 97% 100%  Weight:   153 lb 3.2 oz (69.5 kg)   Height:        Intake/Output Summary (Last 24 hours) at 12/19/15 1146 Last data filed at 12/19/15 1019  Gross per 24 hour  Intake             1143 ml  Output             1925 ml  Net             -782 ml   Filed Weights   12/17/15 0600 12/18/15 0517 12/19/15 0612  Weight: 158 lb 4.6  oz (71.8 kg) 154 lb 1.6 oz (69.9 kg) 153 lb 3.2 oz (69.5 kg)    Physical Exam    GEN: Well nourished, well developed, in  no acute distress.  Neck: Supple, no JVD, carotid bruits, or masses. Cardiac: RRR, no rubs, or gallops. No clubbing, cyanosis, no edema.  Radials/DP/PT 2+ and equal bilaterally.  Respiratory:  Respirations  regular and unlabored, clear to auscultation bilaterally. GI: Soft, nontender, nondistended, BS + x 4. Neuro:  Strength and sensation are intact.   Labs    CBC  Recent Labs  12/16/15 2305 12/16/15 2317 12/17/15 0318  WBC 11.7*  --  13.2*  NEUTROABS 3.9  --   --   HGB 12.6 12.9 11.3*  HCT 36.5 38.0 33.1*  MCV 95.5  --  93.0  PLT 282  --  [id number]   Basic Metabolic Panel  Recent Labs  12/16/15 2305  12/18/15 0238 12/19/15 0449  NA 135  < > 136  137  K 4.2  < > 3.8 4.3  CL 102  < > 99* 100*  CO2 21*  --  29 30  GLUCOSE 400*  < > 174* 201*  BUN 19  < > 25* 28*  CREATININE 1.23*  < > 1.10* 0.98  CALCIUM 9.0  --  9.2 9.5  MG 2.1  --   --   --   < > = values in this interval not displayed. Liver Function Tests No results for input(s): AST, ALT, ALKPHOS, BILITOT, PROT, ALBUMIN in the last 72 hours. No results for input(s): LIPASE, AMYLASE in the last 72 hours. Cardiac Enzymes  Recent Labs  12/17/15 0318 12/17/15 1232 12/17/15 1808  TROPONINI 0.13* 0.13* 0.11*   BNP Invalid input(s): POCBNP D-Dimer No results for input(s): DDIMER in the last 72 hours. Hemoglobin A1C  Recent Labs  12/17/15 0318  HGBA1C 7.6*   Fasting Lipid Panel  Recent Labs  12/17/15 0040  CHOL 181  HDL 64  LDLCALC 88  TRIG 143  CHOLHDL 2.8   Thyroid Function Tests  Recent Labs  12/17/15 0318  TSH 1.037     ECG    NA  Radiology    Nm Myocar Multi W/spect W/wall Motion / Ef  Result Date: 12/17/2015 CLINICAL DATA:  Chest pain with shortness of breath. History of diabetes, hypertension, smoking, congestive heart failure and Coronary artery disease.  EXAM: MYOCARDIAL IMAGING WITH SPECT (REST AND PHARMACOLOGIC-STRESS) GATED LEFT VENTRICULAR WALL MOTION STUDY LEFT VENTRICULAR EJECTION FRACTION TECHNIQUE: Standard myocardial SPECT imaging was performed after resting intravenous injection of 10 mCi Tc-12m tetrofosmin. Subsequently, intravenous infusion of Lexiscan was performed under the supervision of the Cardiology staff. At peak effect of the drug, 30 mCi Tc-15m tetrofosmin was injected intravenously and standard myocardial SPECT imaging was performed. Quantitative gated imaging was also performed to evaluate left ventricular wall motion, and estimate left ventricular ejection fraction. COMPARISON:  Portable chest 12/16/2015.  Cardiac MRI 07/07/2008. FINDINGS: Perfusion: There is a large fixed perfusion defect involving the apex and the distal segments of the anterior and lateral walls. No reversible components are identified. Wall Motion: Marked apical hypokinesis. There is lesser hypokinesis involving the remainder of the myocardium. Left Ventricular Ejection Fraction: 38 % End diastolic volume 123XX123 ml End systolic volume 62 ml IMPRESSION: 1. Large infarct involving the apex, distal anterior and lateral walls. No reversal component to suggest myocardial ischemia. 2. Associated apical hypokinesis. 3. Left ventricular ejection fraction 38% 4. Non invasive risk stratification*: Intermediate *2012 Appropriate Use Criteria for Coronary Revascularization Focused Update: J Am Coll Cardiol. [id number]. http://content.[email or web address]?[email or web address] Electronically Signed   By: Richardean Sale M.D.   On: 12/17/2015 13:41   Dg Chest Port 1 View  Result Date: 12/16/2015 CLINICAL DATA:  Shortness of breath, tachycardia EXAM: PORTABLE CHEST 1 VIEW COMPARISON:  06/28/2015 FINDINGS: AP portable semi-erect view of the chest demonstrates mild cardiomegaly. Diffuse interstitial prominence suggests edema, similar compared to prior study. No effusion. No  consolidation. Atherosclerosis of the aorta. No pneumothorax. IMPRESSION: 1. Cardiomegaly with mild central vascular congestion. Diffuse interstitial prominence suggests mild pulmonary edema. No effusions or focal infiltrates. Electronically Signed   By: Donavan Foil M.D.   On: 12/16/2015 23:24    Assessment & Plan    ACUTE RESPIRATORY FAILURE:  1000 negative yesterday.  I changed to PO Lasix.  I will put this on hold pending the cath.  Will need to be resumed after the cath prior to discharge most likely.   ELEVATED TROPONIN:  EF was lower on stress test.  Plan is for left heart cath on Monday.     DM:  Continue current therapy with SSI.   She was not started on her out patient insulin.  I will start this at a lower dose.    HYPERLIPIDEMIA:    Continue current therapy.   Signed, Minus Breeding, MD  12/19/2015, 11:46 AM

## 2015-12-20 NOTE — Progress Notes (Signed)
At being of shift patient was anxious about cath procedure. B/p stable high. Pt watched a video which helped calm thought about procedure/  Patient experiencing a restful night with.

## 2015-12-20 NOTE — Progress Notes (Signed)
Pt had 8 beats run of VT and asymptomatic.  Notified L. Herbie [place], [place].  Instructed that they will be up to check on her.   Will continuue to monitor.  Karie Kirks, Therapist, sports.

## 2015-12-21 ENCOUNTER — Other Ambulatory Visit: Payer: Self-pay | Admitting: Cardiology

## 2015-12-21 DIAGNOSIS — I10 Essential (primary) hypertension: Secondary | ICD-10-CM

## 2015-12-21 LAB — GLUCOSE, CAPILLARY
GLUCOSE-CAPILLARY: 215 mg/dL — AB (ref 65–99)
GLUCOSE-CAPILLARY: 178 mg/dL — AB (ref 65–99)

## 2015-12-21 LAB — BASIC METABOLIC PANEL
ANION GAP: 6 (ref 5–15)
BUN: 23 mg/dL — ABNORMAL HIGH (ref 6–20)
CALCIUM: 9.2 mg/dL (ref 8.9–10.3)
CO2: 25 mmol/L (ref 22–32)
Chloride: 105 mmol/L (ref 101–111)
Creatinine, Ser: 0.89 mg/dL (ref 0.44–1.00)
GFR calc Af Amer: >60 mL/min (ref >60)
GLUCOSE: 218 mg/dL — AB (ref 65–99)
Potassium: 4.4 mmol/L (ref 3.5–5.1)
SODIUM: 136 mmol/L (ref 135–145)

## 2015-12-21 MED ORDER — MAGNESIUM OXIDE 400 (241.3 MG) MG PO TABS: 400.0000 mg | ORAL_TABLET | Freq: Two times a day (BID) | ORAL | 3 refills | Status: DC

## 2015-12-21 MED ORDER — FUROSEMIDE 40 MG PO TABS: 40.0000 mg | ORAL_TABLET | Freq: Every day | ORAL | 6 refills | Status: DC

## 2015-12-21 MED ORDER — CARVEDILOL 3.125 MG PO TABS: 3.1250 mg | ORAL_TABLET | Freq: Two times a day (BID) | ORAL | Status: DC

## 2015-12-21 MED ORDER — CARVEDILOL 3.125 MG PO TABS: 3.1250 mg | ORAL_TABLET | Freq: Two times a day (BID) | ORAL | 3 refills | Status: DC

## 2015-12-21 MED ORDER — ATORVASTATIN CALCIUM 40 MG PO TABS: 40.0000 mg | ORAL_TABLET | Freq: Every day | ORAL | 6 refills | Status: DC

## 2015-12-21 NOTE — Progress Notes (Signed)
At 1522 all d/c instructions explained and given to pt.  Verbalized understanding.  D/c off floor via w/c by NT to awaiting transport.  Karie Kirks, Therapist, sports.

## 2015-12-21 NOTE — Care Management Important Message (Signed)
Important Message  Patient Details  Name: [name] MRN: [id number] Date of Birth: 05/21/1945   Medicare Important Message Given:  Yes    Orbie Pyo 12/21/2015, 12:35 PM

## 2015-12-21 NOTE — Discharge Summary (Signed)
Discharge Summary    Patient ID: [name],  MRN: [id number], DOB/AGE: 72-Jul-1947 72 y.o.  Admit date: 12/16/2015 Discharge date: 12/21/2015   Primary Care Provider: Ralene Bathe Pavelock Primary Cardiologist: Dr. Saunders Revel   Discharge Diagnoses    Principal Problem:   Acute respiratory failure with hypoxia [facility]) Active Problems:   Type 2 diabetes mellitus without complication, with long-term current use of insulin (HCC)   COPD exacerbation (HCC)   Acute on chronic congestive heart failure (HCC)   Elevated troponin   Pure hypercholesterolemia   Tobacco abuse   Allergies No Known Allergies  Diagnostic Studies/Procedures    TTE: 10/27  Study Conclusions  - Left ventricle: The cavity size was normal. Wall thickness was   normal. Systolic function was mildly to moderately reduced. The   estimated ejection fraction was in the range of 40% to 45%.   Features are consistent with a pseudonormal left ventricular   filling pattern, with concomitant abnormal relaxation and   increased filling pressure (grade 2 diastolic dysfunction). - Mitral valve: There was mild regurgitation. - Left atrium: The atrium was severely dilated  10/30  Conclusion   Conclusions: 1. Mild to moderate, nonobstructive coronary artery disease, including 15% distal LAD stenosis, 30% ulcerated mid LCx disease, and 40% mid RCA stenosis. 2. Upper normal left ventricular filling pressure (LVEDP 15 mmHg).  Recommendations: 1. Aggressive risk factor modification including high-intensity statin therapy. 2. Continue evidence based heart failure therapy for nonischemic cardiomyopathy. 3. Hydrate gently today following procedure; restart standing furosemide tomorrow.   _____________   History of Present Illness     70 yo female with PMH of HTN, CAD, COPD, CHF, and DM. She had not been seen by cardiology since 2014 and was seen at that time by Dr. Irish Lack as a consult. Was noted to be noncompliant with  medications at that time, and was set up for outpatient follow up but did not follow through. She presented to the ED via EMS after reporting that her breathing became significantly worse on the night of 10/27. O2 sats were noted at 62%, and was placed on CPAP. Was initial unresponsive, and had to be bagged via EMS. Once in the ED she was placed on Bipap and had significant improvement. She reported trying to increase her lasix to twice a day at home prior to admission, as she noticed that her weight had been increasing.   Hospital Course     Consultants: None  She was admitted to step-down on Bipap with plans to IV diuresis. The following day she was able to wean off Bipap, BNP on admission was 657. Her blood pressure was noted to be low, so her lisinopril and Coreg were held. Did have a mildly elevated trop, and had an abnormal lexiscan myoview on 10/27. Plan was made to proceed with LHC given defect and reported decreased EF on myoview. Follow up Echo showed EF of 40-45% with G2DD with mild MR and severely dilated LA.   She diuresed well on IV lasix and was transitioned to PO over the weekend. On 10/30 she went for [facility] with Dr. Saunders Revel showing mild to moderate nonobstructive CAD with normal LVEDP. She diuresed a total of 5.5L total this admission. Her blood pressure improved, and she was started back on home dose of 3.125mg  BID of coreg. Her lasix dose was adjusted to 40mg  PO daily. Will hold her lisinopril at the time of discharge as she did have some hypotension this admission.  She was seen and assessed by Dr. Harrington Challenger and determined stable for discharge home. I have arrange for 2 week follow up in the office with an APP, she would like to see Dr. Saunders Revel for long term management. Will have her come for follow up labs next with at the [address] office.  _____________  Discharge Vitals Blood pressure 113/87, pulse 77, temperature 97.7 F (36.5 C), temperature source Oral, resp. rate 18, height 5\' 3"   (1.6 m), weight 154 lb (69.9 kg), SpO2 100 %.  Filed Weights   12/19/15 0612 12/20/15 0530 12/21/15 0313  Weight: 153 lb 3.2 oz (69.5 kg) 153 lb (69.4 kg) 154 lb (69.9 kg)    Labs & Radiologic Studies    CBC No results for input(s): WBC, NEUTROABS, HGB, HCT, MCV, PLT in the last 72 hours. Basic Metabolic Panel  Recent Labs  12/20/15 0412 12/21/15 0425  NA 137 136  K 4.3 4.4  CL 102 105  CO2 29 25  GLUCOSE 164* 218*  BUN 27* 23*  CREATININE 0.90 0.89  CALCIUM 9.3 9.2   Liver Function Tests No results for input(s): AST, ALT, ALKPHOS, BILITOT, PROT, ALBUMIN in the last 72 hours. No results for input(s): LIPASE, AMYLASE in the last 72 hours. Cardiac Enzymes No results for input(s): CKTOTAL, CKMB, CKMBINDEX, TROPONINI in the last 72 hours. BNP Invalid input(s): POCBNP D-Dimer No results for input(s): DDIMER in the last 72 hours. Hemoglobin A1C No results for input(s): HGBA1C in the last 72 hours. Fasting Lipid Panel No results for input(s): CHOL, HDL, LDLCALC, TRIG, CHOLHDL, LDLDIRECT in the last 72 hours. Thyroid Function Tests No results for input(s): TSH, T4TOTAL, T3FREE, THYROIDAB in the last 72 hours.  Invalid input(s): FREET3 _____________  Nm Myocar Multi W/spect W/wall Motion / Ef  Result Date: 12/17/2015 CLINICAL DATA:  Chest pain with shortness of breath. History of diabetes, hypertension, smoking, congestive heart failure and Coronary artery disease. EXAM: MYOCARDIAL IMAGING WITH SPECT (REST AND PHARMACOLOGIC-STRESS) GATED LEFT VENTRICULAR WALL MOTION STUDY LEFT VENTRICULAR EJECTION FRACTION TECHNIQUE: Standard myocardial SPECT imaging was performed after resting intravenous injection of 10 mCi Tc-61m tetrofosmin. Subsequently, intravenous infusion of Lexiscan was performed under the supervision of the Cardiology staff. At peak effect of the drug, 30 mCi Tc-52m tetrofosmin was injected intravenously and standard myocardial SPECT imaging was performed. Quantitative  gated imaging was also performed to evaluate left ventricular wall motion, and estimate left ventricular ejection fraction. COMPARISON:  Portable chest 12/16/2015.  Cardiac MRI 07/07/2008. FINDINGS: Perfusion: There is a large fixed perfusion defect involving the apex and the distal segments of the anterior and lateral walls. No reversible components are identified. Wall Motion: Marked apical hypokinesis. There is lesser hypokinesis involving the remainder of the myocardium. Left Ventricular Ejection Fraction: 38 % End diastolic volume 123XX123 ml End systolic volume 62 ml IMPRESSION: 1. Large infarct involving the apex, distal anterior and lateral walls. No reversal component to suggest myocardial ischemia. 2. Associated apical hypokinesis. 3. Left ventricular ejection fraction 38% 4. Non invasive risk stratification*: Intermediate *2012 Appropriate Use Criteria for Coronary Revascularization Focused Update: J Am Coll Cardiol. [id number]. http://content.airportbarriers.com.aspx?articleid=[email or web address] Electronically Signed   By: Richardean Sale M.D.   On: 12/17/2015 13:41   Dg Chest Port 1 View  Result Date: 12/16/2015 CLINICAL DATA:  Shortness of breath, tachycardia EXAM: PORTABLE CHEST 1 VIEW COMPARISON:  06/28/2015 FINDINGS: AP portable semi-erect view of the chest demonstrates mild cardiomegaly. Diffuse interstitial prominence suggests edema, similar compared to prior study. No effusion. No consolidation.  Atherosclerosis of the aorta. No pneumothorax. IMPRESSION: 1. Cardiomegaly with mild central vascular congestion. Diffuse interstitial prominence suggests mild pulmonary edema. No effusions or focal infiltrates. Electronically Signed   By: Donavan Foil M.D.   On: 12/16/2015 23:24   Disposition   Pt is being discharged home today in good condition.  Follow-up Plans & Appointments    Follow-up Information    Richardson Dopp, PA-C Follow up on 01/04/2016.   Specialties:  Cardiology, Physician  Assistant Why:  at 9:15 for your hospital follow up with Richardson Dopp.  Contact information: [id number] [place] [address] [phone]        [facility] Follow up on 12/29/2015.   Why:  Please come for follow up labs at the office between 8-4.  Contact information: [place] [phone]         Discharge Instructions    Diet - low sodium heart healthy    Complete by:  As directed    Discharge instructions    Complete by:  As directed    Radial Site Care Refer to this sheet in the next few weeks. These instructions provide you with information on caring for yourself after your procedure. Your caregiver may also give you more specific instructions. Your treatment has been planned according to current medical practices, but problems sometimes occur. Call your caregiver if you have any problems or questions after your procedure. HOME CARE INSTRUCTIONS You may shower the day after the procedure.Remove the bandage (dressing) and gently wash the site with plain soap and water.Gently pat the site dry.  Do not apply powder or lotion to the site.  Do not submerge the affected site in water for 3 to 5 days.  Inspect the site at least twice daily.  Do not flex or bend the affected arm for 24 hours.  No lifting over 5 pounds (2.3 kg) for 5 days after your procedure.  Do not drive home if you are discharged the same day of the procedure. Have someone else drive you.  You may drive 24 hours after the procedure unless otherwise instructed by your caregiver.  What to expect: Any bruising will usually fade within 1 to 2 weeks.  Blood that collects in the tissue (hematoma) may be painful to the touch. It should usually decrease in size and tenderness within 1 to 2 weeks.  SEEK IMMEDIATE MEDICAL CARE IF: You have unusual pain at the radial site.  You have redness,  warmth, swelling, or pain at the radial site.  You have drainage (other than a small amount of blood on the dressing).  You have chills.  You have a fever or persistent symptoms for more than 72 hours.  You have a fever and your symptoms suddenly get worse.  Your arm becomes pale, cool, tingly, or numb.  You have heavy bleeding from the site. Hold pressure on the site.   Increase activity slowly    Complete by:  As directed       Discharge Medications   Current Discharge Medication List    CONTINUE these medications which have CHANGED   Details  atorvastatin (LIPITOR) 40 MG tablet Take 1 tablet (40 mg total) by mouth daily. Qty: 30 tablet, Refills: 6    carvedilol (COREG) 3.125 MG tablet Take 1 tablet (3.125 mg total) by mouth 2 (two) times daily with a meal. Qty: 60 tablet, Refills: 3    furosemide (  LASIX) 40 MG tablet Take 1 tablet (40 mg total) by mouth daily. Qty: 30 tablet, Refills: 6    magnesium oxide (MAG-OX) 400 (241.3 Mg) MG tablet Take 1 tablet (400 mg total) by mouth 2 (two) times daily. Qty: 60 tablet, Refills: 3      CONTINUE these medications which have NOT CHANGED   Details  aspirin 81 MG chewable tablet Chew 1 tablet (81 mg total) by mouth daily.    insulin NPH-regular Human (NOVOLIN 70/30) (70-30) 100 UNIT/ML injection Inject 5-15 Units into the skin See admin instructions. 15 units in am and 5 units in pm Qty: 10 mL, Refills: 0    metFORMIN (GLUCOPHAGE) 500 MG tablet Take 1,000 mg by mouth 2 (two) times daily with a meal.      STOP taking these medications     lisinopril (PRINIVIL,ZESTRIL) 2.5 MG tablet          Outstanding Labs/Studies   BMET next week.   Duration of Discharge Encounter   Greater than 30 minutes including physician time.  Signed, Reino Bellis NP-C 12/21/2015, 1:32 PM

## 2015-12-21 NOTE — Progress Notes (Signed)
Patient Name: [name] Date of Encounter: 12/21/2015  Primary Cardiologist: Dr. Marinda Elk Problem List     Principal Problem:   Acute respiratory failure with hypoxia [facility]) Active Problems:   Type 2 diabetes mellitus without complication, with long-term current use of insulin (HCC)   COPD exacerbation (HCC)   Acute on chronic congestive heart failure (HCC)   Elevated troponin   Pure hypercholesterolemia   Tobacco abuse     Subjective   Feeling well today.   Inpatient Medications    Scheduled Meds: . aspirin EC  81 mg Oral Daily  . atorvastatin  40 mg Oral Daily  . enoxaparin (LOVENOX) injection  40 mg Subcutaneous Q24H  . furosemide  40 mg Oral Daily  . insulin aspart  0-15 Units Subcutaneous TID WC  . insulin aspart  0-5 Units Subcutaneous QHS  . insulin NPH Human  10 Units Subcutaneous QAC breakfast  . insulin NPH Human  5 Units Subcutaneous QHS  . magnesium oxide  400 mg Oral BID  . mouth rinse  15 mL Mouth Rinse BID  . regadenoson  0.4 mg Intravenous Once  . sodium chloride flush  3 mL Intravenous Q12H  . sodium chloride flush  3 mL Intravenous Q12H   Continuous Infusions:   PRN Meds: sodium chloride, sodium chloride, acetaminophen, ondansetron (ZOFRAN) IV, sodium chloride flush, sodium chloride flush   Vital Signs    Vitals:   12/20/15 1201 12/20/15 2124 12/21/15 0313 12/21/15 0415  BP: (!) 129/104 (!) 119/99  (!) 134/95  Pulse: 88 77  76  Resp:  18  20  Temp:  97.4 F (36.3 C)  97.6 F (36.4 C)  TempSrc:  Oral  Oral  SpO2:  99%  100%  Weight:   154 lb (69.9 kg)   Height:        Intake/Output Summary (Last 24 hours) at 12/21/15 1146 Last data filed at 12/21/15 0912  Gross per 24 hour  Intake              723 ml  Output              925 ml  Net             -202 ml   Filed Weights   12/19/15 0612 12/20/15 0530 12/21/15 0313  Weight: 153 lb 3.2 oz (69.5 kg) 153 lb (69.4 kg) 154 lb (69.9 kg)    Physical Exam   GEN: Well  nourished, well developed, in no acute distress.  HEENT: Grossly normal.  Neck: Supple, no JVD, carotid bruits, or masses. Cardiac: RRR, no murmurs, rubs, or gallops. No clubbing, cyanosis, edema.  Radials/DP/PT 2+ and equal bilaterally.  Respiratory:  Respirations regular and unlabored, clear to auscultation bilaterally. GI: Soft, nontender, nondistended, BS + x 4. MS: no deformity or atrophy. Skin: warm and dry, no rash. Right radial cath site stable.  Neuro:  Strength and sensation are intact. Psych: AAOx3.  Normal affect.  Labs    CBC No results for input(s): WBC, NEUTROABS, HGB, HCT, MCV, PLT in the last 72 hours. Basic Metabolic Panel  Recent Labs  12/20/15 0412 12/21/15 0425  NA 137 136  K 4.3 4.4  CL 102 105  CO2 29 25  GLUCOSE 164* 218*  BUN 27* 23*  CREATININE 0.90 0.89  CALCIUM 9.3 9.2   Liver Function Tests No results for input(s): AST, ALT, ALKPHOS, BILITOT, PROT, ALBUMIN in the last 72 hours. No results for input(s): LIPASE,  AMYLASE in the last 72 hours. Cardiac Enzymes No results for input(s): CKTOTAL, CKMB, CKMBINDEX, TROPONINI in the last 72 hours. BNP Invalid input(s): POCBNP D-Dimer No results for input(s): DDIMER in the last 72 hours. Hemoglobin A1C No results for input(s): HGBA1C in the last 72 hours. Fasting Lipid Panel No results for input(s): CHOL, HDL, LDLCALC, TRIG, CHOLHDL, LDLDIRECT in the last 72 hours. Thyroid Function Tests No results for input(s): TSH, T4TOTAL, T3FREE, THYROIDAB in the last 72 hours.  Invalid input(s): FREET3  Telemetry    SR, brief episode of NSVT - Personally Reviewed  ECG    N/A - Personally Reviewed  Radiology    No results found.  Cardiac Studies   TTE: 10/27  Study Conclusions  - Left ventricle: The cavity size was normal. Wall thickness was   normal. Systolic function was mildly to moderately reduced. The   estimated ejection fraction was in the range of 40% to 45%.   Features are consistent  with a pseudonormal left ventricular   filling pattern, with concomitant abnormal relaxation and   increased filling pressure (grade 2 diastolic dysfunction). - Mitral valve: There was mild regurgitation. - Left atrium: The atrium was severely dilated.  LHC: 10/30  Conclusion   Conclusions: 1. Mild to moderate, nonobstructive coronary artery disease, including 15% distal LAD stenosis, 30% ulcerated mid LCx disease, and 40% mid RCA stenosis. 2. Upper normal left ventricular filling pressure (LVEDP 15 mmHg).  Recommendations: 1. Aggressive risk factor modification including high-intensity statin therapy. 2. Continue evidence based heart failure therapy for nonischemic cardiomyopathy. 3. Hydrate gently today following procedure; restart standing furosemide tomorrow.    Patient Profile     70 yo BF with history of CHF, DM, HTN, COPD presents with acute worsening SOB. Noted several days of increased weight. Denies chest pain. She had prior cardiac MRI in 2010 showing EF 38%. Echos in 2014 and earlier this year showed EF 40-45%  Assessment & Plan    1. Acute hypoxic respiratory failure secondary to Acute on chronic systolic CHF. Patient reports compliance with meds. She has been eating out more recently with increased sodium intake. Good response to IV lasix, has been transitioned to PO 40mg  daily. Sodium restriction. Blood pressure is stable. Will add back coreg 3.125mg  BID.   2. Elevated troponin Had an abnormal myoview, and underwent LHC yesterday with Dr. ENd showing mld to moderated nonobstructive CAD, with normal LVEDP.  3. DM on insulin. Metformin on hold. SSI. At home on 70/30 28 units in am and 10 units in pm.   4. HLD on statin.  5. COPD with ongoing tobacco use. Encourage smoking cessation  Signed, Reino Bellis, NP  12/21/2015, 11:46 AM   Pt seen and examined  She looks much better from when I admitted her last Thursday evening  Cat hwth mild to mod CAD  Plan for  continued medical Rx with close outpt f/u  Patient admits to bad habits that may have contrib to recent decline  Plans to change ON exam Lungs are CTA  Cardiac RRR  No S3 Ext with no signif edema    OK to d/u from cardiac standpoint with close outpt fu    Dorris Carnes

## 2016-01-04 ENCOUNTER — Encounter: Payer: Self-pay | Admitting: Physician Assistant

## 2016-01-04 ENCOUNTER — Encounter: Payer: Medicare Other | Admitting: Physician Assistant

## 2016-01-04 NOTE — Progress Notes (Deleted)
Cardiology Office Note:    Date:  01/04/2016   ID:  [name], DOB 06/22/1945, MRN [id number]  PCP:  Ashley Docker, MD  Cardiologist:  ??  Electrophysiologist:  n/a  Referring MD: Ashley Docker, MD   No chief complaint on file. ***  History of Present Illness:    [name] is a 70 y.o. female with a hx of Systolic CHF, nonischemic cardiomyopathy, nonobstructive CAD by cardiac catheterization in 2010, diabetes, breast cancer, tobacco abuse, COPD. She was remotely evaluated by [name] in 2010 after admission for congestive heart failure. Cardiac MRI at that time demonstrated EF 38% with inferior and inferolateral wall motion abnormality. She was lost to follow-up and seen by [name] in the hospital in 2014. Again, she was lost to follow-up and was admitted in 5/17 with acute on chronic CHF.  She was to FU with the CHF Clinic but was never seen.    She was recently admitted 10/26-10/31 with hypoxic respiratory failure secondary to acute on chronic combined systolic and diastolic CHF. O2 sats were 62% upon presentation she required BiPAP. She was treated with IV Lasix. Troponin levels were minimally elevated. Inpatient nuclear perfusion study was abnormal with evidence of anterior and lateral infarct but no ischemia, EF 38%. Cardiac catheterization demonstrated mild nonobstructive CAD. LVEDP was mildly elevated at 15. Echocardiogram demonstrated EF 40-45%. She had grade 2 diastolic dysfunction. Blood pressure limited CHF medication titration initially. Eventually she was placed back on low-dose beta blocker therapy with carvedilol. Of note, lisinopril was held at discharge.  She returns for FU.  ***  Prior CV studies that were reviewed today include:    LHC 12/20/15 LM normal LAD distal 15 LCx proximal 30, ulcerative; OM2 and lateral OM2 minimal luminal irregularities RCA mid 40 LVEDP 15  Echo 12/17/15 EF 40-45, Gr 2 DD, mild MR, severe LAE  Myoview  12/17/15 IMPRESSION: 1. Large infarct involving the apex, distal anterior and lateral walls. No reversal component to suggest myocardial ischemia. 2. Associated apical hypokinesis. 3. Left ventricular ejection fraction 38% 4. Non invasive risk stratification*: Intermediate  Echo 5/17 EF 40-45, inferior HK, grade 1 diastolic dysfunction, mild MR, mild LAE, reduced RVSF, mild TR, PASP 33  Echo 8/14 EF 40, inferior/inferoseptal hypokinesis, grade 1 diastolic dysfunction, mild MR  Echo 3/13 EF 0000000, grade 1 diastolic dysfunction, mild MR  cMRI 5/10 IMPRESSION: 1.  Moderate LV systolic dysfunction with EF 38%.  The inferior and inferolateral basilar wall appear segmentally worse. 2.  Very small area of mid-wall delayed enhancement in the inferolateral base. This is nonspecific and can be seen with infiltrative diseases such as sarcoid as well as with myocarditis.  Past Medical History:  Diagnosis Date  . Breast cancer ([name])    breast - left   . CAD (coronary artery disease)    a. nonobs by LHC in 2010 // b. Myoview 10/17: Lg infarct apex, distal ant and lat walls, no ischemia, EF 38; int risk  // c. LHC 10/17:  dLAD 15, pLCx 30, mRCA 40, LVEDP 15  . Chronic combined systolic and diastolic CHF (congestive heart failure) ([place])    a. cMRI 5/10: EF 38% // b. Echo 3/13/: EF 50-55, Gr 1 DD // c. Echo 8/14: EF 40, inf/inf-septal HK, Gr 1 DD, mild MR // d. Echo 5/17: EF 40-45, inf HK, Gr 1 DD, mild MR, mild LAE, reduced RVSF, mild TR, PASP 33 // e. Echo 10/17: EF 40-45, Gr 2 DD, mild  MR, severe LAE  . COPD (chronic obstructive pulmonary disease) ([place])   . Diabetes mellitus   . NICM (nonischemic cardiomyopathy) [facility])     Past Surgical History:  Procedure Laterality Date  . APPENDECTOMY    . BREAST LUMPECTOMY     lt breast  . CARDIAC CATHETERIZATION N/A 12/20/2015   Procedure: Left Heart Cath and Coronary Angiography;  Surgeon: Ashley Bush, MD;  Location: [facility];  Service:  Cardiovascular;  Laterality: N/A;    Current Medications: No outpatient prescriptions have been marked as taking for the 01/04/16 encounter (Appointment) with Ashley Shi, PA-C.     Allergies:   Patient has no known allergies.   Social History   Social History  . Marital status: Widowed    Spouse name: N/A  . Number of children: N/A  . Years of education: N/A   Social History Main Topics  . Smoking status: Current Every Day Smoker    Years: 30.00    Last attempt to quit: 09/29/2012  . Smokeless tobacco: Never Used  . Alcohol use No  . Drug use: No  . Sexual activity: Not on file   Other Topics Concern  . Not on file   Social History Narrative  . No narrative on file     Family History:  The patient's ***family history includes [name] in her father.   ROS:   Please see the history of present illness.    ROS All other systems reviewed and are negative.   EKGs/Labs/Other Test Reviewed:    EKG:  EKG is *** ordered today.  The ekg ordered today demonstrates ***  Recent Labs: 06/29/2015: ALT 56 12/16/2015: Magnesium 2.1 12/17/2015: B Natriuretic Peptide 657.7; Hemoglobin 11.3; Platelets 250; TSH 1.037 12/21/2015: BUN 23; Creatinine, Ser 0.89; Potassium 4.4; Sodium 136   Recent Lipid Panel    Component Value Date/Time   CHOL 181 12/17/2015 0040   TRIG 143 12/17/2015 0040   HDL 64 12/17/2015 0040   CHOLHDL 2.8 12/17/2015 0040   VLDL 29 12/17/2015 0040   LDLCALC 88 12/17/2015 0040     Physical Exam:    VS:  There were no vitals taken for this visit.    Wt Readings from Last 3 Encounters:  12/21/15 154 lb (69.9 kg)  06/29/15 140 lb 4.8 oz (63.6 kg)  10/06/12 139 lb 9.6 oz (63.3 kg)     ***Physical Exam  ASSESSMENT:    1. Chronic combined systolic and diastolic CHF (congestive heart failure) ([place])   2. NICM (nonischemic cardiomyopathy) ([place])   3. Coronary artery disease involving native coronary artery of native heart without angina pectoris   4.  Pure hypercholesterolemia   5. Tobacco abuse    PLAN:    In order of problems listed above:  1. Chronic combined systolic and diastolic CHF - *** 2. Nonischemic cardiopathy - She has undergone catheterization 2010 again in 10/17 demonstrating mild nonobstructive CAD. EF is >35%.*** 3. CAD - *** 4. HL - *** 5. Tobacco abuse - ***   Medication Adjustments/Labs and Tests Ordered: Current medicines are reviewed at length with the patient today.  Concerns regarding medicines are outlined above.  Medication changes, Labs and Tests ordered today are outlined in the Patient Instructions noted below. There are no Patient Instructions on file for this visit. Signed, Richardson Dopp, PA-C  01/04/2016 9:02 AM    [facility] [place], [place], [place]  [address] Phone: [phone]; Fax: [phone]

## 2016-01-12 ENCOUNTER — Encounter: Payer: Self-pay | Admitting: Physician Assistant

## 2016-03-16 ENCOUNTER — Emergency Department (HOSPITAL_COMMUNITY): Payer: Medicare Other

## 2016-03-16 ENCOUNTER — Encounter (HOSPITAL_COMMUNITY): Payer: Self-pay

## 2016-03-16 ENCOUNTER — Inpatient Hospital Stay (HOSPITAL_COMMUNITY)
Admission: EM | Admit: 2016-03-16 | Discharge: 2016-03-18 | DRG: 641 | Disposition: A | Discharge status: Home / Self Care | Payer: Medicare Other | Attending: Internal Medicine | Admitting: Internal Medicine

## 2016-03-16 DIAGNOSIS — E7800 Pure hypercholesterolemia, unspecified: Secondary | ICD-10-CM | POA: Diagnosis present

## 2016-03-16 DIAGNOSIS — E1122 Type 2 diabetes mellitus with diabetic chronic kidney disease: Secondary | ICD-10-CM | POA: Diagnosis present

## 2016-03-16 DIAGNOSIS — R0602 Shortness of breath: Secondary | ICD-10-CM | POA: Diagnosis not present

## 2016-03-16 DIAGNOSIS — R778 Other specified abnormalities of plasma proteins: Secondary | ICD-10-CM | POA: Diagnosis present

## 2016-03-16 DIAGNOSIS — I5042 Chronic combined systolic (congestive) and diastolic (congestive) heart failure: Secondary | ICD-10-CM | POA: Diagnosis present

## 2016-03-16 DIAGNOSIS — R002 Palpitations: Secondary | ICD-10-CM | POA: Diagnosis present

## 2016-03-16 DIAGNOSIS — N1832 Chronic kidney disease, stage 3b: Secondary | ICD-10-CM | POA: Diagnosis present

## 2016-03-16 DIAGNOSIS — I428 Other cardiomyopathies: Secondary | ICD-10-CM | POA: Diagnosis present

## 2016-03-16 DIAGNOSIS — E860 Dehydration: Principal | ICD-10-CM | POA: Diagnosis present

## 2016-03-16 DIAGNOSIS — R7989 Other specified abnormal findings of blood chemistry: Secondary | ICD-10-CM | POA: Diagnosis present

## 2016-03-16 DIAGNOSIS — E11649 Type 2 diabetes mellitus with hypoglycemia without coma: Secondary | ICD-10-CM | POA: Diagnosis present

## 2016-03-16 DIAGNOSIS — N183 Chronic kidney disease, stage 3 unspecified: Secondary | ICD-10-CM | POA: Diagnosis present

## 2016-03-16 DIAGNOSIS — E1169 Type 2 diabetes mellitus with other specified complication: Secondary | ICD-10-CM

## 2016-03-16 DIAGNOSIS — Z823 Family history of stroke: Secondary | ICD-10-CM

## 2016-03-16 DIAGNOSIS — Z794 Long term (current) use of insulin: Secondary | ICD-10-CM

## 2016-03-16 DIAGNOSIS — Z7982 Long term (current) use of aspirin: Secondary | ICD-10-CM

## 2016-03-16 DIAGNOSIS — E785 Hyperlipidemia, unspecified: Secondary | ICD-10-CM | POA: Diagnosis present

## 2016-03-16 DIAGNOSIS — N182 Chronic kidney disease, stage 2 (mild): Secondary | ICD-10-CM | POA: Diagnosis present

## 2016-03-16 DIAGNOSIS — E119 Type 2 diabetes mellitus without complications: Secondary | ICD-10-CM

## 2016-03-16 DIAGNOSIS — I2510 Atherosclerotic heart disease of native coronary artery without angina pectoris: Secondary | ICD-10-CM | POA: Diagnosis present

## 2016-03-16 DIAGNOSIS — Z853 Personal history of malignant neoplasm of breast: Secondary | ICD-10-CM

## 2016-03-16 DIAGNOSIS — Z87891 Personal history of nicotine dependence: Secondary | ICD-10-CM

## 2016-03-16 DIAGNOSIS — I5032 Chronic diastolic (congestive) heart failure: Secondary | ICD-10-CM | POA: Diagnosis present

## 2016-03-16 DIAGNOSIS — R739 Hyperglycemia, unspecified: Secondary | ICD-10-CM

## 2016-03-16 DIAGNOSIS — I130 Hypertensive heart and chronic kidney disease with heart failure and stage 1 through stage 4 chronic kidney disease, or unspecified chronic kidney disease: Secondary | ICD-10-CM | POA: Diagnosis present

## 2016-03-16 DIAGNOSIS — E1165 Type 2 diabetes mellitus with hyperglycemia: Secondary | ICD-10-CM | POA: Diagnosis present

## 2016-03-16 DIAGNOSIS — J449 Chronic obstructive pulmonary disease, unspecified: Secondary | ICD-10-CM | POA: Diagnosis present

## 2016-03-16 DIAGNOSIS — I5022 Chronic systolic (congestive) heart failure: Secondary | ICD-10-CM | POA: Diagnosis present

## 2016-03-16 LAB — BASIC METABOLIC PANEL
ANION GAP: 7 (ref 5–15)
BUN: 28 mg/dL — ABNORMAL HIGH (ref 6–20)
CHLORIDE: 102 mmol/L (ref 101–111)
CO2: 27 mmol/L (ref 22–32)
Calcium: 9.3 mg/dL (ref 8.9–10.3)
Creatinine, Ser: 1.15 mg/dL — ABNORMAL HIGH (ref 0.44–1.00)
GFR calc non Af Amer: 46 mL/min — ABNORMAL LOW (ref >60)
GFR, EST AFRICAN AMERICAN: 54 mL/min — AB (ref >60)
Glucose, Bld: 127 mg/dL — ABNORMAL HIGH (ref 65–99)
POTASSIUM: 4.2 mmol/L (ref 3.5–5.1)
SODIUM: 136 mmol/L (ref 135–145)

## 2016-03-16 LAB — I-STAT TROPONIN, ED: Troponin i, poc: 0.01 ng/mL (ref 0.00–0.08)

## 2016-03-16 LAB — CBC
HEMATOCRIT: 35.6 % — AB (ref 36.0–46.0)
Hemoglobin: 12.3 g/dL (ref 12.0–15.0)
MCH: 30.8 pg (ref 26.0–34.0)
MCHC: 34.6 g/dL (ref 30.0–36.0)
MCV: 89.2 fL (ref 78.0–100.0)
Platelets: 259 10*3/uL (ref 150–400)
RBC: 3.99 MIL/uL (ref 3.87–5.11)
RDW: 13.1 % (ref 11.5–15.5)
WBC: 8.1 10*3/uL (ref 4.0–10.5)

## 2016-03-16 LAB — PROTIME-INR
INR: 1.12
PROTHROMBIN TIME: 14.4 s (ref 11.4–15.2)

## 2016-03-16 MED ORDER — SODIUM CHLORIDE 0.9 % IV BOLUS (SEPSIS)
500.0000 mL | Freq: Once | INTRAVENOUS | Status: AC
  Administered 2016-03-17: 500 mL via INTRAVENOUS

## 2016-03-16 NOTE — ED Triage Notes (Signed)
PT C/O PALPITATIONS AND SOB SINCE LAST NIGHT. PT STS SHE WAS SEEN AT AN [facility] TODAY AND TOLD TO COME HERE. PT STS SHE HAS A HX OF CHF AND WAS HOSPITALIZED IN NOV. DENIES CHEST PAIN.

## 2016-03-17 DIAGNOSIS — R002 Palpitations: Secondary | ICD-10-CM

## 2016-03-17 DIAGNOSIS — E1165 Type 2 diabetes mellitus with hyperglycemia: Secondary | ICD-10-CM | POA: Diagnosis present

## 2016-03-17 DIAGNOSIS — N1832 Chronic kidney disease, stage 3b: Secondary | ICD-10-CM | POA: Diagnosis present

## 2016-03-17 DIAGNOSIS — Z853 Personal history of malignant neoplasm of breast: Secondary | ICD-10-CM | POA: Diagnosis not present

## 2016-03-17 DIAGNOSIS — N183 Chronic kidney disease, stage 3 unspecified: Secondary | ICD-10-CM | POA: Diagnosis present

## 2016-03-17 DIAGNOSIS — E7800 Pure hypercholesterolemia, unspecified: Secondary | ICD-10-CM | POA: Diagnosis present

## 2016-03-17 DIAGNOSIS — R0602 Shortness of breath: Secondary | ICD-10-CM | POA: Diagnosis present

## 2016-03-17 DIAGNOSIS — E11649 Type 2 diabetes mellitus with hypoglycemia without coma: Secondary | ICD-10-CM | POA: Diagnosis present

## 2016-03-17 DIAGNOSIS — Z794 Long term (current) use of insulin: Secondary | ICD-10-CM | POA: Diagnosis not present

## 2016-03-17 DIAGNOSIS — I5042 Chronic combined systolic (congestive) and diastolic (congestive) heart failure: Secondary | ICD-10-CM | POA: Diagnosis present

## 2016-03-17 DIAGNOSIS — I130 Hypertensive heart and chronic kidney disease with heart failure and stage 1 through stage 4 chronic kidney disease, or unspecified chronic kidney disease: Secondary | ICD-10-CM | POA: Diagnosis present

## 2016-03-17 DIAGNOSIS — Z7982 Long term (current) use of aspirin: Secondary | ICD-10-CM | POA: Diagnosis not present

## 2016-03-17 DIAGNOSIS — I428 Other cardiomyopathies: Secondary | ICD-10-CM | POA: Diagnosis present

## 2016-03-17 DIAGNOSIS — I2510 Atherosclerotic heart disease of native coronary artery without angina pectoris: Secondary | ICD-10-CM | POA: Diagnosis present

## 2016-03-17 DIAGNOSIS — Z823 Family history of stroke: Secondary | ICD-10-CM | POA: Diagnosis not present

## 2016-03-17 DIAGNOSIS — N182 Chronic kidney disease, stage 2 (mild): Secondary | ICD-10-CM | POA: Diagnosis present

## 2016-03-17 DIAGNOSIS — Z87891 Personal history of nicotine dependence: Secondary | ICD-10-CM | POA: Diagnosis not present

## 2016-03-17 DIAGNOSIS — J449 Chronic obstructive pulmonary disease, unspecified: Secondary | ICD-10-CM | POA: Diagnosis present

## 2016-03-17 DIAGNOSIS — E860 Dehydration: Secondary | ICD-10-CM | POA: Diagnosis present

## 2016-03-17 DIAGNOSIS — E1122 Type 2 diabetes mellitus with diabetic chronic kidney disease: Secondary | ICD-10-CM | POA: Diagnosis present

## 2016-03-17 LAB — BASIC METABOLIC PANEL
ANION GAP: 6 (ref 5–15)
BUN: 26 mg/dL — ABNORMAL HIGH (ref 6–20)
CALCIUM: 8.8 mg/dL — AB (ref 8.9–10.3)
CO2: 26 mmol/L (ref 22–32)
Chloride: 104 mmol/L (ref 101–111)
Creatinine, Ser: 0.87 mg/dL (ref 0.44–1.00)
Glucose, Bld: 255 mg/dL — ABNORMAL HIGH (ref 65–99)
POTASSIUM: 4.4 mmol/L (ref 3.5–5.1)
Sodium: 136 mmol/L (ref 135–145)

## 2016-03-17 LAB — CBC
HEMATOCRIT: 31.7 % — AB (ref 36.0–46.0)
HEMOGLOBIN: 11 g/dL — AB (ref 12.0–15.0)
MCH: 31.4 pg (ref 26.0–34.0)
MCHC: 34.7 g/dL (ref 30.0–36.0)
MCV: 90.6 fL (ref 78.0–100.0)
Platelets: 226 10*3/uL (ref 150–400)
RBC: 3.5 MIL/uL — AB (ref 3.87–5.11)
RDW: 13.3 % (ref 11.5–15.5)
WBC: 8.9 10*3/uL (ref 4.0–10.5)

## 2016-03-17 LAB — MAGNESIUM: MAGNESIUM: 2.2 mg/dL (ref 1.7–2.4)

## 2016-03-17 LAB — GLUCOSE, CAPILLARY
GLUCOSE-CAPILLARY: 157 mg/dL — AB (ref 65–99)
GLUCOSE-CAPILLARY: 286 mg/dL — AB (ref 65–99)
GLUCOSE-CAPILLARY: 221 mg/dL — AB (ref 65–99)

## 2016-03-17 LAB — BRAIN NATRIURETIC PEPTIDE: B Natriuretic Peptide: 362.7 pg/mL — ABNORMAL HIGH (ref 0.0–100.0)

## 2016-03-17 LAB — TROPONIN I
TROPONIN I: 0.03 ng/mL — AB (ref <0.03)
Troponin I: 0.03 ng/mL (ref <0.03)
Troponin I: <0.03 ng/mL (ref <0.03)

## 2016-03-17 LAB — TSH: TSH: 1.268 u[IU]/mL (ref 0.350–4.500)

## 2016-03-17 LAB — CBG MONITORING, ED: GLUCOSE-CAPILLARY: 225 mg/dL — AB (ref 65–99)

## 2016-03-17 MED ORDER — SODIUM CHLORIDE 0.9 % IV SOLN
INTRAVENOUS | Status: AC
  Administered 2016-03-17: 15:00:00 via INTRAVENOUS

## 2016-03-17 MED ORDER — ALBUTEROL SULFATE (2.5 MG/3ML) 0.083% IN NEBU: 2.5000 mg | INHALATION_SOLUTION | Freq: Four times a day (QID) | RESPIRATORY_TRACT | Status: DC | PRN

## 2016-03-17 MED ORDER — INSULIN ASPART PROT & ASPART (70-30 MIX) 100 UNIT/ML ~~LOC~~ SUSP: 5.0000 [IU] | Freq: Every day | SUBCUTANEOUS | Status: DC

## 2016-03-17 MED ORDER — ONDANSETRON HCL 4 MG PO TABS: 4.0000 mg | ORAL_TABLET | Freq: Four times a day (QID) | ORAL | Status: DC | PRN

## 2016-03-17 MED ORDER — ATORVASTATIN CALCIUM 40 MG PO TABS
40.0000 mg | ORAL_TABLET | Freq: Every day | ORAL | Status: DC
  Administered 2016-03-17: 40 mg via ORAL
  Filled 2016-03-17: qty 1

## 2016-03-17 MED ORDER — INSULIN ASPART 100 UNIT/ML ~~LOC~~ SOLN
0.0000 [IU] | Freq: Every day | SUBCUTANEOUS | Status: DC
  Administered 2016-03-17: 2 [IU] via SUBCUTANEOUS

## 2016-03-17 MED ORDER — ACETAMINOPHEN 650 MG RE SUPP: 650.0000 mg | Freq: Four times a day (QID) | RECTAL | Status: DC | PRN

## 2016-03-17 MED ORDER — ZOLPIDEM TARTRATE 5 MG PO TABS: 5.0000 mg | ORAL_TABLET | Freq: Every evening | ORAL | Status: DC | PRN

## 2016-03-17 MED ORDER — INSULIN ASPART 100 UNIT/ML ~~LOC~~ SOLN
0.0000 [IU] | Freq: Three times a day (TID) | SUBCUTANEOUS | Status: DC
  Administered 2016-03-17: 2 [IU] via SUBCUTANEOUS
  Administered 2016-03-17: 5 [IU] via SUBCUTANEOUS
  Administered 2016-03-17: 3 [IU] via SUBCUTANEOUS
  Administered 2016-03-18: 2 [IU] via SUBCUTANEOUS
  Filled 2016-03-17: qty 1

## 2016-03-17 MED ORDER — SODIUM CHLORIDE 0.9% FLUSH
3.0000 mL | Freq: Two times a day (BID) | INTRAVENOUS | Status: DC
  Administered 2016-03-17 – 2016-03-18 (×3): 3 mL via INTRAVENOUS

## 2016-03-17 MED ORDER — CARVEDILOL 3.125 MG PO TABS
3.1250 mg | ORAL_TABLET | Freq: Two times a day (BID) | ORAL | Status: DC
  Administered 2016-03-17 – 2016-03-18 (×4): 3.125 mg via ORAL
  Filled 2016-03-17 (×5): qty 1

## 2016-03-17 MED ORDER — ONDANSETRON HCL 4 MG/2ML IJ SOLN: 4.0000 mg | Freq: Four times a day (QID) | INTRAMUSCULAR | Status: DC | PRN

## 2016-03-17 MED ORDER — INSULIN ASPART PROT & ASPART (70-30 MIX) 100 UNIT/ML ~~LOC~~ SUSP
10.0000 [IU] | Freq: Every day | SUBCUTANEOUS | Status: DC
  Administered 2016-03-17: 10 [IU] via SUBCUTANEOUS

## 2016-03-17 MED ORDER — ACETAMINOPHEN 325 MG PO TABS: 650.0000 mg | ORAL_TABLET | Freq: Four times a day (QID) | ORAL | Status: DC | PRN

## 2016-03-17 MED ORDER — MAGNESIUM OXIDE 400 (241.3 MG) MG PO TABS
400.0000 mg | ORAL_TABLET | Freq: Two times a day (BID) | ORAL | Status: DC
  Administered 2016-03-17 – 2016-03-18 (×4): 400 mg via ORAL
  Filled 2016-03-17 (×6): qty 1

## 2016-03-17 MED ORDER — ENOXAPARIN SODIUM 40 MG/0.4ML ~~LOC~~ SOLN
40.0000 mg | Freq: Every day | SUBCUTANEOUS | Status: DC
  Administered 2016-03-17 (×2): 40 mg via SUBCUTANEOUS
  Filled 2016-03-17 (×2): qty 0.4

## 2016-03-17 MED ORDER — ASPIRIN 81 MG PO CHEW
81.0000 mg | CHEWABLE_TABLET | Freq: Every day | ORAL | Status: DC
  Administered 2016-03-17 – 2016-03-18 (×2): 81 mg via ORAL
  Filled 2016-03-17 (×2): qty 1

## 2016-03-17 MED ORDER — INSULIN ASPART PROT & ASPART (70-30 MIX) 100 UNIT/ML ~~LOC~~ SUSP
10.0000 [IU] | Freq: Every day | SUBCUTANEOUS | Status: DC
  Administered 2016-03-17: 10 [IU] via SUBCUTANEOUS
  Filled 2016-03-17: qty 10

## 2016-03-17 MED ORDER — INSULIN ASPART PROT & ASPART (70-30 MIX) 100 UNIT/ML ~~LOC~~ SUSP
15.0000 [IU] | Freq: Every day | SUBCUTANEOUS | Status: DC
  Administered 2016-03-18: 15 [IU] via SUBCUTANEOUS

## 2016-03-17 NOTE — ED Provider Notes (Signed)
[facility] DEPT Provider Note   CSN: [id number] Arrival date & time: 03/16/16  2003     History   Chief Complaint Chief Complaint  Patient presents with  . Palpitations  . Shortness of Breath    HPI [name] is a 71 y.o. female.  HPI   71 yo F with complex PMHx including breast CA, CAD, combined CHF, COPD, here with palpitations and lightheadedness. Pt states that for the past 2 days, she has had mild cough, general fatigue, and shortness of breath. Over the last 24 hours, she has developed palpitations with a sensation of intermittent lightheadedness and shortness of breath. No overt chest pain. She denies symptomatic palpitations at baseline. She does have known sick contacts. No known fevers. No myalgias. She became more lightheaded this afternoon and subsequently presents for evaluation. Has not missed any medications.  Past Medical History:  Diagnosis Date  . Breast cancer ([place])    breast - left   . CAD (coronary artery disease)    a. nonobs by LHC in 2010 // b. Myoview 10/17: Lg infarct apex, distal ant and lat walls, no ischemia, EF 38; int risk  // c. LHC 10/17:  dLAD 15, pLCx 30, mRCA 40, LVEDP 15  . Chronic combined systolic and diastolic CHF (congestive heart failure) ([place])    a. cMRI 5/10: EF 38% // b. Echo 3/13/: EF 50-55, Gr 1 DD // c. Echo 8/14: EF 40, inf/inf-septal HK, Gr 1 DD, mild MR // d. Echo 5/17: EF 40-45, inf HK, Gr 1 DD, mild MR, mild LAE, reduced RVSF, mild TR, PASP 33 // e. Echo 10/17: EF 40-45, Gr 2 DD, mild MR, severe LAE  . COPD (chronic obstructive pulmonary disease) ([place])   . Diabetes mellitus   . NICM (nonischemic cardiomyopathy) [facility])     Patient Active Problem List   Diagnosis Date Noted  . CKD (chronic kidney disease), stage II 03/17/2016  . Palpitation 03/17/2016  . Pure hypercholesterolemia   . Tobacco abuse   . COPD (chronic obstructive pulmonary disease) ([place]) 06/28/2015  . Syncope 06/28/2015  . Chronic combined systolic and  diastolic CHF (congestive heart failure) ([place]) 06/28/2015  . NICM (nonischemic cardiomyopathy) ([place]) 10/05/2012  . Abdominal mass, LLQ (left lower quadrant) 05/03/2012  . PULMONARY NODULE 08/05/2008  . Palpitations 08/05/2008  . Type 2 diabetes mellitus without complication, with long-term current use of insulin ([place]) 07/16/2008  . HYPERTENSION, CONTROLLED 07/16/2008  . Coronary artery disease involving native heart without angina pectoris 07/16/2008    Past Surgical History:  Procedure Laterality Date  . APPENDECTOMY    . BREAST LUMPECTOMY     lt breast  . CARDIAC CATHETERIZATION N/A 12/20/2015   Procedure: Left Heart Cath and Coronary Angiography;  Surgeon: Nelva Bush, MD;  Location: [facility];  Service: Cardiovascular;  Laterality: N/A;    OB History    No data available       Home Medications    Prior to Admission medications   Medication Sig Start Date End Date Taking? Authorizing Provider  aspirin 81 MG chewable tablet Chew 1 tablet (81 mg total) by mouth daily. 10/06/12  Yes Eugenie Filler, MD  atorvastatin (LIPITOR) 40 MG tablet Take 1 tablet (40 mg total) by mouth daily. 12/22/15  Yes Cheryln Manly, NP  carvedilol (COREG) 3.125 MG tablet Take 1 tablet (3.125 mg total) by mouth 2 (two) times daily with a meal. 12/21/15  Yes Cheryln Manly, NP  furosemide (LASIX) 40  MG tablet Take 1 tablet (40 mg total) by mouth daily. 12/22/15  Yes Cheryln Manly, NP  insulin NPH-regular Human (NOVOLIN 70/30) (70-30) 100 UNIT/ML injection Inject 5-15 Units into the skin See admin instructions. 15 units in am and 5 units in pm Patient taking differently: Inject 10-20 Units into the skin See admin instructions. 20 units in am and 10 units in pm 06/29/15  Yes Janece Canterbury, MD  magnesium oxide (MAG-OX) 400 (241.3 Mg) MG tablet Take 1 tablet (400 mg total) by mouth 2 (two) times daily. 12/21/15  Yes Cheryln Manly, NP    Family History Family History  Problem  Relation Age of Onset  . Stroke Father     Social History Social History  Substance Use Topics  . Smoking status: Former Smoker    Years: 30.00    Quit date: 09/29/2012  . Smokeless tobacco: Never Used  . Alcohol use No     Allergies   Patient has no known allergies.   Review of Systems Review of Systems  Constitutional: Positive for fatigue. Negative for chills and fever.  HENT: Negative for congestion, rhinorrhea and sore throat.   Eyes: Negative for visual disturbance.  Respiratory: Positive for shortness of breath. Negative for cough and wheezing.   Cardiovascular: Positive for palpitations. Negative for chest pain and leg swelling.  Gastrointestinal: Negative for abdominal pain, diarrhea, nausea and vomiting.  Genitourinary: Negative for dysuria, flank pain, vaginal bleeding and vaginal discharge.  Musculoskeletal: Negative for neck pain.  Skin: Negative for rash.  Allergic/Immunologic: Negative for immunocompromised state.  Neurological: Positive for weakness and light-headedness. Negative for syncope and headaches.  Hematological: Does not bruise/bleed easily.  All other systems reviewed and are negative.    Physical Exam Updated Vital Signs BP 113/69 (BP Location: Right Arm)   Pulse 78   Temp 98.3 F (36.8 C) (Oral)   Resp 16   Ht 5\' 3"  (1.6 m)   Wt 166 lb (75.3 kg)   SpO2 100%   BMI 29.41 kg/m   Physical Exam  Constitutional: She is oriented to person, place, and time. She appears well-developed and well-nourished. No distress.  HENT:  Head: Normocephalic and atraumatic.  Eyes: Conjunctivae are normal.  Neck: Neck supple.  Cardiovascular: Normal rate, regular rhythm and normal heart sounds.  Frequent extrasystoles are present. Exam reveals no friction rub.   No murmur heard. Pulmonary/Chest: Effort normal and breath sounds normal. No respiratory distress. She has no wheezes. She has no rales.  Abdominal: She exhibits no distension.  Musculoskeletal:  She exhibits no edema.  Neurological: She is alert and oriented to person, place, and time. She exhibits normal muscle tone.  Skin: Skin is warm. Capillary refill takes less than 2 seconds.  Psychiatric: She has a normal mood and affect.  Nursing note and vitals reviewed.    ED Treatments / Results  Labs (all labs ordered are listed, but only abnormal results are displayed) Labs Reviewed  BASIC METABOLIC PANEL - Abnormal; Notable for the following:       Result Value   Glucose, Bld 127 (*)    BUN 28 (*)    Creatinine, Ser 1.15 (*)    GFR calc non Af Amer 46 (*)    GFR calc Af Amer 54 (*)    All other components within normal limits  CBC - Abnormal; Notable for the following:    HCT 35.6 (*)    All other components within normal limits  BRAIN NATRIURETIC PEPTIDE -  Abnormal; Notable for the following:    B Natriuretic Peptide 362.7 (*)    All other components within normal limits  TROPONIN I - Abnormal; Notable for the following:    Troponin I 0.03 (*)    All other components within normal limits  PROTIME-INR  MAGNESIUM  TROPONIN I  TROPONIN I  TSH  BASIC METABOLIC PANEL  CBC  I-STAT TROPOININ, ED    EKG  EKG Interpretation  Date/Time:  Thursday March 16 2016 20:15:25 EST Ventricular Rate:  83 PR Interval:    QRS Duration: 109 QT Interval:  384 QTC Calculation: 412 R Axis:   -29 Text Interpretation:  Sinus rhythm Premature ventricular complexes Borderline left axis deviation Since last tracing, PVCs remain evident Artifact from prior EKG makes comparison difficult Confirmed by Aubreyanna Dorrough MD, Lysbeth Galas [phone]) on 03/17/2016 3:32:51 AM       Radiology Dg Chest 2 View  Result Date: 03/16/2016 CLINICAL DATA:  Patient with palpitations and shortness of breath. EXAM: CHEST  2 VIEW COMPARISON:  Chest radiograph 12/16/2015. FINDINGS: Stable prominent cardiac and mediastinal contours. Tortuosity and calcification of the thoracic aorta. No consolidative pulmonary opacities. No  pleural effusion or pneumothorax. Mid thoracic spine degenerative changes. IMPRESSION: No acute cardiopulmonary process.  Mild cardiomegaly. Electronically Signed   By: Lovey Newcomer M.D.   On: 03/16/2016 21:11    Procedures Procedures (including critical care time)  Medications Ordered in ED Medications  atorvastatin (LIPITOR) tablet 40 mg (not administered)  carvedilol (COREG) tablet 3.125 mg (3.125 mg Oral Given 03/17/16 0155)  magnesium oxide (MAG-OX) tablet 400 mg (400 mg Oral Given 03/17/16 0154)  insulin aspart protamine- aspart (NOVOLOG MIX 70/30) injection 10 Units (not administered)  aspirin chewable tablet 81 mg (not administered)  enoxaparin (LOVENOX) injection 40 mg (40 mg Subcutaneous Given 03/17/16 0156)  sodium chloride flush (NS) 0.9 % injection 3 mL (3 mLs Intravenous Given 03/17/16 0109)  acetaminophen (TYLENOL) tablet 650 mg (not administered)    Or  acetaminophen (TYLENOL) suppository 650 mg (not administered)  ondansetron (ZOFRAN) tablet 4 mg (not administered)    Or  ondansetron (ZOFRAN) injection 4 mg (not administered)  zolpidem (AMBIEN) tablet 5 mg (not administered)  albuterol (PROVENTIL) (2.5 MG/3ML) 0.083% nebulizer solution 2.5 mg (not administered)  insulin aspart (novoLOG) injection 0-9 Units (not administered)  insulin aspart (novoLOG) injection 0-5 Units (not administered)  insulin aspart protamine- aspart (NOVOLOG MIX 70/30) injection 5 Units (not administered)  sodium chloride 0.9 % bolus 500 mL (500 mLs Intravenous New Bag/Given 03/17/16 0107)     Initial Impression / Assessment and Plan / ED Course  I have reviewed the triage vital signs and the nursing notes.  Pertinent labs & imaging results that were available during my care of the patient were reviewed by me and considered in my medical decision making (see chart for details).    71 yo F with extensive PMHx including known CHF here with symptomatic palpitations. Initial labs show possible mild  AKI, likely 2/2 over diuresis versus comcomitant URI. EKG without ischemia. Pt does, however, have marked ectopy on monitor with intermittent non-perfusing PVCs. Given her cardiac history and near-syncopal sx with ectopy, will admit for gentle hydration, tele monitoring.  Final Clinical Impressions(s) / ED Diagnoses   Final diagnoses:  Palpitation    New Prescriptions New Prescriptions   No medications on file     Duffy Bruce, MD 03/17/16 1427

## 2016-03-17 NOTE — Progress Notes (Signed)
Patient seen and examined. Admitted after midnight secondary to lightheadedness and palpitations. Appears to be secondary to dehydration. Patient chronically on lasix and also with elevated sugars, most likely increasing diuresis. Received 500 bolus in ED, no CP, marginal troponin elevation (chronically in that range) and elevated CBG's. Still with sx's when changing position, but improved. Will give more IVF's, continue holding lasix and follow orthostatics. Most likely home in am. Refer to H&P written by Dr. Blaine Hamper on 1/26 for further info/details on admission.  Barton Dubois [id number]

## 2016-03-17 NOTE — Progress Notes (Signed)
Inpatient Diabetes Program Recommendations  AACE/ADA: New Consensus Statement on Inpatient Glycemic Control (2015)  Target Ranges:  Prepandial:   less than 140 mg/dL      Peak postprandial:   less than 180 mg/dL (1-2 hours)      Critically ill patients:  140 - 180 mg/dL   Lab Results  Component Value Date   GLUCAP 225 (H) 03/17/2016   HGBA1C 7.6 (H) 12/17/2015    Review of Glycemic Control  Diabetes history: DM2 Outpatient Diabetes medications: NPH 70/30 20 units in am and 10 units QPM Current orders for Inpatient glycemic control: Novolog 70/30 10 in am and 5 units QPM, Novolog 0-9 units tidwc and hs  Inpatient Diabetes Program Recommendations:    Need updated HgbA1C to assess glycemic control prior to admission.  Will continue to follow.  Thank you. Lorenda Peck, RD, LDN, CDE Inpatient Diabetes Coordinator [phone]

## 2016-03-17 NOTE — H&P (Addendum)
History and Physical    [name] [id number] DOB: 1945-06-26 DOA: 03/16/2016  Referring MD/NP/PA:   PCP: Javier Docker, MD   Patient coming from:  The patient is coming from home.  At baseline, pt is independent for most of ADL.  Chief Complaint:  Palpitation, lightheadedness  HPI: [name] is a 71 y.o. female with medical history significant of diabetes mellitus, hyperlipidemia, COPD, combined systolic and diastolic CHF, COPD, breast cancer (s/p of lumpectomy), CKD-II, who presents with palpitations and lightheadedness.  Patient states that she started feeling palpitation and lightheadedness since yesterday. She denies chest pain. She has mild shortness of breath, but no cough, fever or chills. She does not have runny nose or sore throat. Denies nausea, vomiting, diarrhea, abdominal pain, symptoms of UTI. Patient states that she has mild neck pain due to working on computers in the past 2 weeks. No unilateral weakness, vision change or hearing loss.  ED Course: pt was found to have frequent PVCs on EKG, WBC 8.1, negative troponin, INR 1.12, stable renal function, negative chest x-ray, temperature normal, no tachycardia, oxygen saturation 100% on room air. Patient is placed on telemetry bed for observation.  Review of Systems:   General: no fevers, chills, no changes in body weight, has fatigue HEENT: no blurry vision, hearing changes or sore throat Respiratory: no dyspnea, coughing, wheezing CV: no chest pain, has palpitations GI: no nausea, vomiting, abdominal pain, diarrhea, constipation GU: no dysuria, burning on urination, increased urinary frequency, hematuria  Ext: no leg edema Neuro: no unilateral weakness, numbness, or tingling, no vision change or hearing loss. Has lightheadedness. Skin: no rash, no skin tear. MSK: No muscle spasm, no deformity, no limitation of range of movement in spin Heme: No easy bruising.  Travel history: No recent long distant  travel.  Allergy: No Known Allergies  Past Medical History:  Diagnosis Date  . Breast cancer ([place])    breast - left   . CAD (coronary artery disease)    a. nonobs by LHC in 2010 // b. Myoview 10/17: Lg infarct apex, distal ant and lat walls, no ischemia, EF 38; int risk  // c. LHC 10/17:  dLAD 15, pLCx 30, mRCA 40, LVEDP 15  . Chronic combined systolic and diastolic CHF (congestive heart failure) ([place])    a. cMRI 5/10: EF 38% // b. Echo 3/13/: EF 50-55, Gr 1 DD // c. Echo 8/14: EF 40, inf/inf-septal HK, Gr 1 DD, mild MR // d. Echo 5/17: EF 40-45, inf HK, Gr 1 DD, mild MR, mild LAE, reduced RVSF, mild TR, PASP 33 // e. Echo 10/17: EF 40-45, Gr 2 DD, mild MR, severe LAE  . COPD (chronic obstructive pulmonary disease) ([place])   . Diabetes mellitus   . NICM (nonischemic cardiomyopathy) [facility])     Past Surgical History:  Procedure Laterality Date  . APPENDECTOMY    . BREAST LUMPECTOMY     lt breast  . CARDIAC CATHETERIZATION N/A 12/20/2015   Procedure: Left Heart Cath and Coronary Angiography;  Surgeon: Nelva Bush, MD;  Location: [place] [facility];  Service: Cardiovascular;  Laterality: N/A;    Social History:  reports that she quit smoking about 3 years ago. She quit after 30.00 years of use. She has never used smokeless tobacco. She reports that she does not drink alcohol or use drugs.  Family History:  Family History  Problem Relation Age of Onset  . Stroke Father      Prior to Admission medications  Medication Sig Start Date End Date Taking? Authorizing Provider  aspirin 81 MG chewable tablet Chew 1 tablet (81 mg total) by mouth daily. 10/06/12  Yes Eugenie Filler, MD  atorvastatin (LIPITOR) 40 MG tablet Take 1 tablet (40 mg total) by mouth daily. 12/22/15  Yes Cheryln Manly, NP  carvedilol (COREG) 3.125 MG tablet Take 1 tablet (3.125 mg total) by mouth 2 (two) times daily with a meal. 12/21/15  Yes Cheryln Manly, NP  furosemide (LASIX) 40 MG tablet Take 1 tablet  (40 mg total) by mouth daily. 12/22/15  Yes Cheryln Manly, NP  insulin NPH-regular Human (NOVOLIN 70/30) (70-30) 100 UNIT/ML injection Inject 5-15 Units into the skin See admin instructions. 15 units in am and 5 units in pm Patient taking differently: Inject 10-20 Units into the skin See admin instructions. 20 units in am and 10 units in pm 06/29/15  Yes Janece Canterbury, MD  magnesium oxide (MAG-OX) 400 (241.3 Mg) MG tablet Take 1 tablet (400 mg total) by mouth 2 (two) times daily. 12/21/15  Yes Cheryln Manly, NP    Physical Exam: Vitals:   03/16/16 2017 03/16/16 2213 03/17/16 0332  BP: 141/71 147/69 113/69  Pulse: 73 (!) 57 78  Resp: 16 16 16   Temp: 98.5 F (36.9 C) 97.9 F (36.6 C) 98.3 F (36.8 C)  TempSrc: Oral Oral Oral  SpO2: 100% 100% 100%  Weight: 75.3 kg (166 lb)    Height: 5\' 3"  (1.6 m)     General: Not in acute distress. Dry mucus and membrane HEENT:       Eyes: PERRL, EOMI, no scleral icterus.       ENT: No discharge from the ears and nose, no pharynx injection, no tonsillar enlargement.        Neck: No JVD, no bruit, no mass felt. Heme: No neck lymph node enlargement. Cardiac: S1/S2, RRR, No murmurs, No gallops or rubs. Respiratory: No rales, wheezing, rhonchi or rubs. GI: Soft, nondistended, nontender, no rebound pain, no organomegaly, BS present. GU: No hematuria Ext: No pitting leg edema bilaterally. 2+DP/PT pulse bilaterally. Musculoskeletal: No joint deformities, No joint redness or warmth, no limitation of ROM in spin. Skin: No rashes.  Neuro: Alert, oriented X3, cranial nerves II-XII grossly intact, moves all extremities normally. Psych: Patient is not psychotic, no suicidal or hemocidal ideation.  Labs on Admission: I have personally reviewed following labs and imaging studies  CBC:  Recent Labs Lab 03/16/16 2054 03/17/16 0459  WBC 8.1 8.9  HGB 12.3 11.0*  HCT 35.6* 31.7*  MCV 89.2 90.6  PLT 259 [id number]   Basic Metabolic Panel:  Recent  Labs Lab 03/16/16 2054 03/17/16 0119  NA 136  --   K 4.2  --   CL 102  --   CO2 27  --   GLUCOSE 127*  --   BUN 28*  --   CREATININE 1.15*  --   CALCIUM 9.3  --   MG  --  2.2   GFR: Estimated Creatinine Clearance: 43 mL/min (by C-G formula based on SCr of 1.15 mg/dL (H)). Liver Function Tests: No results for input(s): AST, ALT, ALKPHOS, BILITOT, PROT, ALBUMIN in the last 168 hours. No results for input(s): LIPASE, AMYLASE in the last 168 hours. No results for input(s): AMMONIA in the last 168 hours. Coagulation Profile:  Recent Labs Lab 03/16/16 2054  INR 1.12   Cardiac Enzymes:  Recent Labs Lab 03/17/16 0119  TROPONINI 0.03*   BNP (last 3  results) No results for input(s): PROBNP in the last 8760 hours. HbA1C: No results for input(s): HGBA1C in the last 72 hours. CBG: No results for input(s): GLUCAP in the last 168 hours. Lipid Profile: No results for input(s): CHOL, HDL, LDLCALC, TRIG, CHOLHDL, LDLDIRECT in the last 72 hours. Thyroid Function Tests: No results for input(s): TSH, T4TOTAL, FREET4, T3FREE, THYROIDAB in the last 72 hours. Anemia Panel: No results for input(s): VITAMINB12, FOLATE, FERRITIN, TIBC, IRON, RETICCTPCT in the last 72 hours. Urine analysis:    Component Value Date/Time   COLORURINE YELLOW 06/28/2015 [place] 06/28/2015 0547   LABSPEC 1.009 06/28/2015 0547   PHURINE 5.0 06/28/2015 0547   GLUCOSEU NEGATIVE 06/28/2015 0547   HGBUR NEGATIVE 06/28/2015 0547   BILIRUBINUR NEGATIVE 06/28/2015 0547   KETONESUR NEGATIVE 06/28/2015 0547   PROTEINUR NEGATIVE 06/28/2015 0547   UROBILINOGEN 0.2 04/28/2012 1757   NITRITE NEGATIVE 06/28/2015 0547   LEUKOCYTESUR NEGATIVE 06/28/2015 0547   Sepsis Labs: @LABRCNTIP (procalcitonin:4,lacticidven:4) )No results found for this or any previous visit (from the past 240 hour(s)).   Radiological Exams on Admission: Dg Chest 2 View  Result Date: 03/16/2016 CLINICAL DATA:  Patient with  palpitations and shortness of breath. EXAM: CHEST  2 VIEW COMPARISON:  Chest radiograph 12/16/2015. FINDINGS: Stable prominent cardiac and mediastinal contours. Tortuosity and calcification of the thoracic aorta. No consolidative pulmonary opacities. No pleural effusion or pneumothorax. Mid thoracic spine degenerative changes. IMPRESSION: No acute cardiopulmonary process.  Mild cardiomegaly. Electronically Signed   By: Lovey Newcomer M.D.   On: 03/16/2016 21:11     EKG: Independently reviewed.  Sinus rhythm, frequent PVC, anteroseptal infarction pattern, poor R-wave progression, LAD, QTC 412 Assessment/Plan Principal Problem:   Palpitations Active Problems:   Type 2 diabetes mellitus without complication, with long-term current use of insulin (HCC)   Coronary artery disease involving native heart without angina pectoris   COPD (chronic obstructive pulmonary disease) (HCC)   Chronic combined systolic and diastolic CHF (congestive heart failure) (HCC)   Elevated troponin   Pure hypercholesterolemia   CKD (chronic kidney disease), stage II   Palpitation   Palpitations: Etiology is not clear. EKG showed frequent PVC. Patient does not have chest pain. Differential diagnoses include dehydration, thyroid dysfunction or cardiac ischemia.  -will place on tele bed for obs -check Mg, TSH -trop x 3 -continue coreg -IVF: 500 cc of NS. -check orthostatic vital signs  Addendum: Trop is 0.03. Pt has chronically elevated trop 0.05~013 recently. No chest pain.  -will continue to trend trop.  DM-II: Last A1c 7.6 on 12/17/15, not well controled. Patient is taking 70/30 insuline at home -will decrease 70/30 insulin dose from 15-5 units to 10-5 units bid -SSI  Coronary artery disease involving native heart without angina pectoris: No CP. -continue ASA, coreg, lipitor  Chronic combined systolic and diastolic CHF: 2-D echo on 12/17/15 showed EF of 40-45 percent with grade 2 diastolic dysfunction. Patient  is taking Lasix 40 mg daily. Patient is clinically dry. No leg edema or JVD. -Hold Lasix -Continue aspirin and Coreg  COPD (chronic obstructive pulmonary disease): stable. No cough. Clear lungs to auscultation -When necessary albuterol nebs  HLD: Last LDL was 88 on 12/17/15 -Continue home medications: Lipitor  CKD-II: stable. Baseline creatinine 1.0-1.1. Her creatinine is 1.15, which is close to baseline. -Follow up renal function by BMP   DVT ppx: SQ Lovenox Code Status: Full code Family Communication:  Yes, patient's daughter at bed side Disposition Plan:  Anticipate discharge back to  previous home environment Consults called:  none Admission status: Obs / tele    Date of Service 03/17/2016    Ivor Costa [facility] Pager [phone]  If 7PM-7AM, please contact night-coverage [email or web address] Password [facility] 03/17/2016, 5:38 AM

## 2016-03-18 DIAGNOSIS — I2510 Atherosclerotic heart disease of native coronary artery without angina pectoris: Secondary | ICD-10-CM

## 2016-03-18 DIAGNOSIS — E119 Type 2 diabetes mellitus without complications: Secondary | ICD-10-CM

## 2016-03-18 DIAGNOSIS — R748 Abnormal levels of other serum enzymes: Secondary | ICD-10-CM

## 2016-03-18 DIAGNOSIS — J449 Chronic obstructive pulmonary disease, unspecified: Secondary | ICD-10-CM

## 2016-03-18 DIAGNOSIS — E7800 Pure hypercholesterolemia, unspecified: Secondary | ICD-10-CM

## 2016-03-18 DIAGNOSIS — R739 Hyperglycemia, unspecified: Secondary | ICD-10-CM

## 2016-03-18 DIAGNOSIS — Z794 Long term (current) use of insulin: Secondary | ICD-10-CM

## 2016-03-18 LAB — BASIC METABOLIC PANEL
ANION GAP: 4 — AB (ref 5–15)
BUN: 26 mg/dL — ABNORMAL HIGH (ref 6–20)
CHLORIDE: 109 mmol/L (ref 101–111)
CO2: 26 mmol/L (ref 22–32)
Calcium: 8.9 mg/dL (ref 8.9–10.3)
Creatinine, Ser: 0.91 mg/dL (ref 0.44–1.00)
GFR calc Af Amer: >60 mL/min (ref >60)
GFR calc non Af Amer: >60 mL/min (ref >60)
Glucose, Bld: 198 mg/dL — ABNORMAL HIGH (ref 65–99)
POTASSIUM: 5.3 mmol/L — AB (ref 3.5–5.1)
SODIUM: 139 mmol/L (ref 135–145)

## 2016-03-18 LAB — GLUCOSE, CAPILLARY
GLUCOSE-CAPILLARY: 142 mg/dL — AB (ref 65–99)
Glucose-Capillary: 187 mg/dL — ABNORMAL HIGH (ref 65–99)

## 2016-03-18 LAB — HEMOGLOBIN A1C
HEMOGLOBIN A1C: 9.2 % — AB (ref 4.8–5.6)
Mean Plasma Glucose: 217 mg/dL

## 2016-03-18 MED ORDER — INSULIN NPH ISOPHANE & REGULAR (70-30) 100 UNIT/ML ~~LOC~~ SUSP: SUBCUTANEOUS | 2 refills | Status: DC

## 2016-03-18 NOTE — Discharge Summary (Signed)
Physician Discharge Summary  WILLETT BUDZYNSKI [id number] DOB: 1945/04/28 DOA: 03/16/2016  PCP: Javier Docker, MD  Admit date: 03/16/2016 Discharge date: 03/18/2016  Time spent: 35 minutes  Recommendations for Outpatient Follow-up:  1. Repeat BMET to follow electrolytes and renal function  2. Close follow up to patient's CBG's and adjust hypoglycemic regimen as needed  3. Reassess volume status   Discharge Diagnoses:  Principal Problem:   Palpitations Active Problems:   Type 2 diabetes mellitus without complication, with long-term current use of insulin (HCC)   Coronary artery disease involving native heart without angina pectoris   COPD (chronic obstructive pulmonary disease) (HCC)   Chronic combined systolic and diastolic CHF (congestive heart failure) (HCC)   Elevated troponin   Pure hypercholesterolemia   CKD (chronic kidney disease), stage II   Palpitation   Discharge Condition: stable and improved. Patient discharge home, with instructions to follow up with PCP in 10 days.  Diet recommendation: modified carbohydrates and heart healthy diet   Filed Weights   03/16/16 2017 03/17/16 1107 03/18/16 0515  Weight: 75.3 kg (166 lb) 75 kg (165 lb 5.5 oz) 78.2 kg (172 lb 6.4 oz)    History of present illness:  As per H&P written by Dr. Blaine Hamper on 03/17/16 71 y.o. female with medical history significant of diabetes mellitus, hyperlipidemia, COPD, combined systolic and diastolic CHF, COPD, breast cancer (s/p of lumpectomy), CKD-II, who presents with palpitations and lightheadedness. Patient states that she started feeling palpitation and lightheadedness since yesterday. She denies chest pain. She has mild shortness of breath, but no cough, fever or chills. She does not have runny nose or sore throat. Denies nausea, vomiting, diarrhea, abdominal pain, symptoms of UTI. Patient states that she has mild neck pain due to working on computers in the past 2 weeks. No unilateral weakness,  vision change or hearing loss.  Hospital Course:   Palpitations: Etiology is not clear. EKG showed frequent PVC. Patient does not have chest pain. Differential diagnoses include dehydration, thyroid dysfunction or cardiac ischemia. -TSH WNL -troponin essentially neg -symptoms associated with dehydration (due to decrease PO intake, continue use of diuretics and uncontrolled diabetes) -symptoms resolved with IVF's resuscitation -patient with orthostatic VS on admissions; resolved by time of discharge  DM-II: A1C 9.2; not well controled.  -advise to follow modified carb diet -resume 70/30 , but dose adjusted to 25 units in am and 15 units at bedtime -patient will need close follow up to her CBG's and further adjustments on hypoglycemic regimen base on CBG's fluctuation.   Coronary artery disease involving native heart without angina pectoris: No CP or SOB. -continue ASA, coreg, lipitor  Chronic combined systolic and diastolic CHF: 2-D echo on 12/17/15 showed EF of 40-45 percent with grade 2 diastolic dysfunction. Patient is taking Lasix 40 mg daily.  -Patient condition is compensated and w/o signs of fluid overload. No leg edema or JVD. -will continue B-blocker and resume lasix at discharge -patient advise to follow daily weights and to watch sodium intake.  COPD (chronic obstructive pulmonary disease): No cough, no wheezing. Clear lungs to auscultation. -continue PRN albuterol inhaler at home.  HLD: Last LDL was 88 on 12/17/15 -Continue Lipitor  CKD-II: stable overall. Baseline creatinine 1.0-1.1. Her creatinine is 1.15, which is close to baseline. -Follow up renal function by BMP as an outpatient -IVF's given during hospitalization -Cr at discharge 0.91  Procedures:  See below for x-ray reports   Consultations:  None   Discharge Exam: Vitals:   03/17/16  1900 03/18/16 0515  BP: 134/61 112/90  Pulse: 76 75  Resp: 18 19  Temp: 98.4 F (36.9 C) 98.1 F (36.7 C)     General: afebrile, no CP, no SOB and feeling back to baseline. Patient denies any further episodes of lightheadedness and/or dizziness  Cardiovascular: S1 and S2, no rubs, no gallops Respiratory: CTA bilaterally Abd: soft, NT, ND, positive BS Extremities: no edema, no cyanosis   Discharge Instructions   Current Discharge Medication List    CONTINUE these medications which have CHANGED   Details  insulin NPH-regular Human (NOVOLIN 70/30) (70-30) 100 UNIT/ML injection Please start using 25 units in am and 15 units at bedtime. Check CBG's three times a day (including fasting in am) Qty: 10 mL, Refills: 2      CONTINUE these medications which have NOT CHANGED   Details  aspirin 81 MG chewable tablet Chew 1 tablet (81 mg total) by mouth daily.    atorvastatin (LIPITOR) 40 MG tablet Take 1 tablet (40 mg total) by mouth daily. Qty: 30 tablet, Refills: 6    carvedilol (COREG) 3.125 MG tablet Take 1 tablet (3.125 mg total) by mouth 2 (two) times daily with a meal. Qty: 60 tablet, Refills: 3    furosemide (LASIX) 40 MG tablet Take 1 tablet (40 mg total) by mouth daily. Qty: 30 tablet, Refills: 6    magnesium oxide (MAG-OX) 400 (241.3 Mg) MG tablet Take 1 tablet (400 mg total) by mouth 2 (two) times daily. Qty: 60 tablet, Refills: 3       No Known Allergies   The results of significant diagnostics from this hospitalization (including imaging, microbiology, ancillary and laboratory) are listed below for reference.    Significant Diagnostic Studies: Dg Chest 2 View  Result Date: 03/16/2016 CLINICAL DATA:  Patient with palpitations and shortness of breath. EXAM: CHEST  2 VIEW COMPARISON:  Chest radiograph 12/16/2015. FINDINGS: Stable prominent cardiac and mediastinal contours. Tortuosity and calcification of the thoracic aorta. No consolidative pulmonary opacities. No pleural effusion or pneumothorax. Mid thoracic spine degenerative changes. IMPRESSION: No acute cardiopulmonary  process.  Mild cardiomegaly. Electronically Signed   By: Lovey Newcomer M.D.   On: 03/16/2016 21:11   Labs: Basic Metabolic Panel:  Recent Labs Lab 03/16/16 2054 03/17/16 0119 03/17/16 0459 03/18/16 0616  NA 136  --  136 139  K 4.2  --  4.4 5.3*  CL 102  --  104 109  CO2 27  --  26 26  GLUCOSE 127*  --  255* 198*  BUN 28*  --  26* 26*  CREATININE 1.15*  --  0.87 0.91  CALCIUM 9.3  --  8.8* 8.9  MG  --  2.2  --   --    CBC:  Recent Labs Lab 03/16/16 2054 03/17/16 0459  WBC 8.1 8.9  HGB 12.3 11.0*  HCT 35.6* 31.7*  MCV 89.2 90.6  PLT 259 226   Cardiac Enzymes:  Recent Labs Lab 03/17/16 0119 03/17/16 0627 03/17/16 1152  TROPONINI 0.03* 0.03* <0.03   BNP: BNP (last 3 results)  Recent Labs  12/16/15 2306 12/17/15 0318 03/17/16 0119  BNP 480.7* 657.7* 362.7*   CBG:  Recent Labs Lab 03/17/16 0803 03/17/16 1201 03/17/16 1703 03/17/16 2135 03/18/16 0733  GLUCAP 225* 157* 286* 221* 187*    Signed:  Barton Dubois MD.  [facility] 03/18/2016, 10:46 AM

## 2016-05-05 ENCOUNTER — Observation Stay (HOSPITAL_BASED_OUTPATIENT_CLINIC_OR_DEPARTMENT_OTHER): Payer: Medicare Other

## 2016-05-05 ENCOUNTER — Emergency Department (HOSPITAL_COMMUNITY): Payer: Medicare Other

## 2016-05-05 ENCOUNTER — Inpatient Hospital Stay (HOSPITAL_COMMUNITY)
Admission: EM | Admit: 2016-05-05 | Discharge: 2016-05-10 | DRG: 286 | Disposition: A | Discharge status: Home / Self Care | Payer: Medicare Other | Attending: Infectious Disease | Admitting: Infectious Disease

## 2016-05-05 ENCOUNTER — Encounter (HOSPITAL_COMMUNITY): Payer: Self-pay | Admitting: *Deleted

## 2016-05-05 DIAGNOSIS — I2510 Atherosclerotic heart disease of native coronary artery without angina pectoris: Secondary | ICD-10-CM | POA: Diagnosis present

## 2016-05-05 DIAGNOSIS — Z7982 Long term (current) use of aspirin: Secondary | ICD-10-CM

## 2016-05-05 DIAGNOSIS — T148XXA Other injury of unspecified body region, initial encounter: Secondary | ICD-10-CM

## 2016-05-05 DIAGNOSIS — Z23 Encounter for immunization: Secondary | ICD-10-CM

## 2016-05-05 DIAGNOSIS — I5032 Chronic diastolic (congestive) heart failure: Secondary | ICD-10-CM | POA: Diagnosis present

## 2016-05-05 DIAGNOSIS — Z794 Long term (current) use of insulin: Secondary | ICD-10-CM

## 2016-05-05 DIAGNOSIS — I5022 Chronic systolic (congestive) heart failure: Secondary | ICD-10-CM | POA: Diagnosis present

## 2016-05-05 DIAGNOSIS — I493 Ventricular premature depolarization: Secondary | ICD-10-CM | POA: Diagnosis not present

## 2016-05-05 DIAGNOSIS — I509 Heart failure, unspecified: Secondary | ICD-10-CM

## 2016-05-05 DIAGNOSIS — R740 Nonspecific elevation of levels of transaminase and lactic acid dehydrogenase [LDH]: Secondary | ICD-10-CM | POA: Diagnosis present

## 2016-05-05 DIAGNOSIS — J9601 Acute respiratory failure with hypoxia: Secondary | ICD-10-CM | POA: Diagnosis present

## 2016-05-05 DIAGNOSIS — R0600 Dyspnea, unspecified: Secondary | ICD-10-CM | POA: Diagnosis not present

## 2016-05-05 DIAGNOSIS — Z86718 Personal history of other venous thrombosis and embolism: Secondary | ICD-10-CM

## 2016-05-05 DIAGNOSIS — E119 Type 2 diabetes mellitus without complications: Secondary | ICD-10-CM

## 2016-05-05 DIAGNOSIS — I130 Hypertensive heart and chronic kidney disease with heart failure and stage 1 through stage 4 chronic kidney disease, or unspecified chronic kidney disease: Principal | ICD-10-CM | POA: Diagnosis present

## 2016-05-05 DIAGNOSIS — I428 Other cardiomyopathies: Secondary | ICD-10-CM

## 2016-05-05 DIAGNOSIS — Z87891 Personal history of nicotine dependence: Secondary | ICD-10-CM

## 2016-05-05 DIAGNOSIS — E1169 Type 2 diabetes mellitus with other specified complication: Secondary | ICD-10-CM

## 2016-05-05 DIAGNOSIS — E1165 Type 2 diabetes mellitus with hyperglycemia: Secondary | ICD-10-CM | POA: Diagnosis present

## 2016-05-05 DIAGNOSIS — N182 Chronic kidney disease, stage 2 (mild): Secondary | ICD-10-CM | POA: Diagnosis present

## 2016-05-05 DIAGNOSIS — R509 Fever, unspecified: Secondary | ICD-10-CM

## 2016-05-05 DIAGNOSIS — E1122 Type 2 diabetes mellitus with diabetic chronic kidney disease: Secondary | ICD-10-CM | POA: Diagnosis present

## 2016-05-05 DIAGNOSIS — J449 Chronic obstructive pulmonary disease, unspecified: Secondary | ICD-10-CM | POA: Diagnosis present

## 2016-05-05 DIAGNOSIS — R0603 Acute respiratory distress: Secondary | ICD-10-CM

## 2016-05-05 DIAGNOSIS — Z853 Personal history of malignant neoplasm of breast: Secondary | ICD-10-CM

## 2016-05-05 DIAGNOSIS — N179 Acute kidney failure, unspecified: Secondary | ICD-10-CM | POA: Diagnosis present

## 2016-05-05 DIAGNOSIS — I5043 Acute on chronic combined systolic (congestive) and diastolic (congestive) heart failure: Secondary | ICD-10-CM | POA: Diagnosis present

## 2016-05-05 DIAGNOSIS — I10 Essential (primary) hypertension: Secondary | ICD-10-CM | POA: Diagnosis present

## 2016-05-05 DIAGNOSIS — Z923 Personal history of irradiation: Secondary | ICD-10-CM

## 2016-05-05 DIAGNOSIS — I248 Other forms of acute ischemic heart disease: Secondary | ICD-10-CM | POA: Diagnosis present

## 2016-05-05 DIAGNOSIS — Z79899 Other long term (current) drug therapy: Secondary | ICD-10-CM

## 2016-05-05 DIAGNOSIS — Z9221 Personal history of antineoplastic chemotherapy: Secondary | ICD-10-CM

## 2016-05-05 LAB — CBC
HCT: 36.4 % (ref 36.0–46.0)
Hemoglobin: 12.1 g/dL (ref 12.0–15.0)
MCH: 31.4 pg (ref 26.0–34.0)
MCHC: 33.2 g/dL (ref 30.0–36.0)
MCV: 94.5 fL (ref 78.0–100.0)
PLATELETS: 232 10*3/uL (ref 150–400)
RBC: 3.85 MIL/uL — ABNORMAL LOW (ref 3.87–5.11)
RDW: 14 % (ref 11.5–15.5)
WBC: 10.6 10*3/uL — AB (ref 4.0–10.5)

## 2016-05-05 LAB — GLUCOSE, CAPILLARY
Glucose-Capillary: 178 mg/dL — ABNORMAL HIGH (ref 65–99)
Glucose-Capillary: 72 mg/dL (ref 65–99)

## 2016-05-05 LAB — COMPREHENSIVE METABOLIC PANEL
ALT: 80 U/L — AB (ref 14–54)
ANION GAP: 13 (ref 5–15)
AST: 71 U/L — ABNORMAL HIGH (ref 15–41)
Albumin: 3.6 g/dL (ref 3.5–5.0)
Alkaline Phosphatase: 107 U/L (ref 38–126)
BUN: 25 mg/dL — ABNORMAL HIGH (ref 6–20)
CALCIUM: 9 mg/dL (ref 8.9–10.3)
CHLORIDE: 103 mmol/L (ref 101–111)
CO2: 23 mmol/L (ref 22–32)
CREATININE: 1.42 mg/dL — AB (ref 0.44–1.00)
GFR, EST AFRICAN AMERICAN: 42 mL/min — AB (ref >60)
GFR, EST NON AFRICAN AMERICAN: 36 mL/min — AB (ref >60)
Glucose, Bld: 306 mg/dL — ABNORMAL HIGH (ref 65–99)
Potassium: 4.4 mmol/L (ref 3.5–5.1)
Sodium: 139 mmol/L (ref 135–145)
Total Bilirubin: 0.5 mg/dL (ref 0.3–1.2)
Total Protein: 6.3 g/dL — ABNORMAL LOW (ref 6.5–8.1)

## 2016-05-05 LAB — CBG MONITORING, ED
GLUCOSE-CAPILLARY: 133 mg/dL — AB (ref 65–99)
Glucose-Capillary: 287 mg/dL — ABNORMAL HIGH (ref 65–99)

## 2016-05-05 LAB — I-STAT TROPONIN, ED: TROPONIN I, POC: 0.02 ng/mL (ref 0.00–0.08)

## 2016-05-05 LAB — TROPONIN I
TROPONIN I: 0.15 ng/mL — AB (ref <0.03)
TROPONIN I: 0.11 ng/mL — AB (ref <0.03)
Troponin I: 0.08 ng/mL (ref <0.03)

## 2016-05-05 LAB — BRAIN NATRIURETIC PEPTIDE: B NATRIURETIC PEPTIDE 5: 866.9 pg/mL — AB (ref 0.0–100.0)

## 2016-05-05 LAB — ECHOCARDIOGRAM COMPLETE
HEIGHTINCHES: 63.5 in
Weight: 2688 oz

## 2016-05-05 LAB — D-DIMER, QUANTITATIVE (NOT AT ARMC): D DIMER QUANT: 0.98 ug{FEU}/mL — AB (ref 0.00–0.50)

## 2016-05-05 MED ORDER — PNEUMOCOCCAL VAC POLYVALENT 25 MCG/0.5ML IJ INJ
0.5000 mL | INJECTION | INTRAMUSCULAR | Status: AC
  Administered 2016-05-07: 0.5 mL via INTRAMUSCULAR
  Filled 2016-05-05: qty 0.5

## 2016-05-05 MED ORDER — SODIUM CHLORIDE 0.9% FLUSH
3.0000 mL | Freq: Two times a day (BID) | INTRAVENOUS | Status: DC
  Administered 2016-05-05 – 2016-05-10 (×10): 3 mL via INTRAVENOUS

## 2016-05-05 MED ORDER — FUROSEMIDE 10 MG/ML IJ SOLN
80.0000 mg | Freq: Two times a day (BID) | INTRAMUSCULAR | Status: DC
  Administered 2016-05-05 – 2016-05-06 (×2): 80 mg via INTRAVENOUS
  Filled 2016-05-05 (×2): qty 8

## 2016-05-05 MED ORDER — IOPAMIDOL (ISOVUE-370) INJECTION 76%
INTRAVENOUS | Status: AC
  Administered 2016-05-05: 80 mL
  Filled 2016-05-05: qty 100

## 2016-05-05 MED ORDER — ENOXAPARIN SODIUM 30 MG/0.3ML ~~LOC~~ SOLN
30.0000 mg | SUBCUTANEOUS | Status: DC
  Administered 2016-05-05: 30 mg via SUBCUTANEOUS
  Filled 2016-05-05: qty 0.3

## 2016-05-05 MED ORDER — CARVEDILOL 3.125 MG PO TABS: 3.1250 mg | ORAL_TABLET | Freq: Two times a day (BID) | ORAL | Status: DC

## 2016-05-05 MED ORDER — ATORVASTATIN CALCIUM 40 MG PO TABS
40.0000 mg | ORAL_TABLET | Freq: Every day | ORAL | Status: DC
  Administered 2016-05-05 – 2016-05-10 (×6): 40 mg via ORAL
  Filled 2016-05-05 (×7): qty 1

## 2016-05-05 MED ORDER — INFLUENZA VAC SPLIT QUAD 0.5 ML IM SUSY
0.5000 mL | PREFILLED_SYRINGE | INTRAMUSCULAR | Status: AC
  Administered 2016-05-06: 0.5 mL via INTRAMUSCULAR
  Filled 2016-05-05: qty 0.5

## 2016-05-05 MED ORDER — ONDANSETRON HCL 4 MG/2ML IJ SOLN
4.0000 mg | Freq: Four times a day (QID) | INTRAMUSCULAR | Status: DC | PRN
  Administered 2016-05-08: 4 mg via INTRAVENOUS
  Filled 2016-05-05: qty 2

## 2016-05-05 MED ORDER — SPIRONOLACTONE 25 MG PO TABS
12.5000 mg | ORAL_TABLET | Freq: Every day | ORAL | Status: DC
  Administered 2016-05-05: 12.5 mg via ORAL
  Filled 2016-05-05: qty 1

## 2016-05-05 MED ORDER — FUROSEMIDE 10 MG/ML IJ SOLN
80.0000 mg | Freq: Once | INTRAMUSCULAR | Status: AC
  Administered 2016-05-05: 80 mg via INTRAVENOUS
  Filled 2016-05-05: qty 8

## 2016-05-05 MED ORDER — ASPIRIN EC 81 MG PO TBEC
81.0000 mg | DELAYED_RELEASE_TABLET | Freq: Every day | ORAL | Status: DC
  Administered 2016-05-05 – 2016-05-10 (×6): 81 mg via ORAL
  Filled 2016-05-05 (×6): qty 1

## 2016-05-05 MED ORDER — ACETAMINOPHEN 325 MG PO TABS
650.0000 mg | ORAL_TABLET | ORAL | Status: DC | PRN
  Administered 2016-05-08: 650 mg via ORAL
  Filled 2016-05-05: qty 2

## 2016-05-05 MED ORDER — INSULIN ASPART PROT & ASPART (70-30 MIX) 100 UNIT/ML ~~LOC~~ SUSP
15.0000 [IU] | Freq: Two times a day (BID) | SUBCUTANEOUS | Status: DC
  Administered 2016-05-05 – 2016-05-10 (×8): 15 [IU] via SUBCUTANEOUS
  Filled 2016-05-05: qty 10

## 2016-05-05 MED ORDER — SODIUM CHLORIDE 0.9 % IV SOLN: 250.0000 mL | INTRAVENOUS | Status: DC | PRN

## 2016-05-05 MED ORDER — DIGOXIN 125 MCG PO TABS
0.1250 mg | ORAL_TABLET | Freq: Every day | ORAL | Status: DC
  Administered 2016-05-05 – 2016-05-10 (×6): 0.125 mg via ORAL
  Filled 2016-05-05 (×6): qty 1

## 2016-05-05 MED ORDER — INSULIN ASPART 100 UNIT/ML ~~LOC~~ SOLN
0.0000 [IU] | Freq: Three times a day (TID) | SUBCUTANEOUS | Status: DC
  Administered 2016-05-05: 3 [IU] via SUBCUTANEOUS
  Administered 2016-05-05: 2 [IU] via SUBCUTANEOUS
  Administered 2016-05-06: 3 [IU] via SUBCUTANEOUS
  Administered 2016-05-06 – 2016-05-07 (×2): 5 [IU] via SUBCUTANEOUS
  Administered 2016-05-07: 3 [IU] via SUBCUTANEOUS
  Administered 2016-05-07: 2 [IU] via SUBCUTANEOUS
  Administered 2016-05-08: 5 [IU] via SUBCUTANEOUS
  Administered 2016-05-08: 8 [IU] via SUBCUTANEOUS
  Administered 2016-05-09: 5 [IU] via SUBCUTANEOUS
  Administered 2016-05-09: 3 [IU] via SUBCUTANEOUS
  Administered 2016-05-09: 5 [IU] via SUBCUTANEOUS
  Administered 2016-05-10: 3 [IU] via SUBCUTANEOUS
  Administered 2016-05-10: 2 [IU] via SUBCUTANEOUS
  Filled 2016-05-05: qty 1

## 2016-05-05 MED ORDER — SODIUM CHLORIDE 0.9% FLUSH: 3.0000 mL | INTRAVENOUS | Status: DC | PRN

## 2016-05-05 NOTE — ED Notes (Signed)
CBG 287; RN, MD aware

## 2016-05-05 NOTE — Progress Notes (Signed)
  Echocardiogram 2D Echocardiogram has been performed.  Darlina Sicilian M 05/05/2016, 12:13 PM

## 2016-05-05 NOTE — ED Notes (Signed)
Report attempted 

## 2016-05-05 NOTE — Procedures (Signed)
Pt transported to CT on Bip[facility], no complications.

## 2016-05-05 NOTE — Progress Notes (Signed)
   Patient declined Advanced Directive. 

## 2016-05-05 NOTE — ED Notes (Signed)
Pt tolerating [place] at 2L.  BP dropping into 90's with map remaining above 60.  MD is aware and has evaluated pt.

## 2016-05-05 NOTE — Consult Note (Signed)
Advanced Heart Failure Team Consult Note  Referring Physician: Dr Posey Pronto Primary HF:  Dr. Aundra Dubin  (Last seen 2010)  Reason for Consultation: A/C systolic CHF  HPI:    TOMISHA REPPUCCI is a 71 y.o. female with chronic combined CHF, poorly controlled DM2, HTN, remote history of Breast CA s/p lumpectomy and radiation as well as chemo (received tamoxifen), Tobacco abuse, and remote DVT, who presents to [facility] with SOB.   Had admission in 95/2841 for A/C systolic CHF. Echo at that time with EF 40-45%, possibly lower by review. Cath during that admission with mild to moderate, but non-obstructive CAD including 15% distal LAD stenosis, 30% ulcerated mid LCx disease, and 40% mid RCA stenosis. Full report as below.   Pt has very poor outpatient follow up.   Pt presented via EMS after developed acute SOB this am.  Placed on  CPAP by EMS then transitioned to BiPAP with marked improvement of her symptoms.  Weight up 15 lbs per patient. Pertinent labs on admission include Creatinine 1.4, BNP 866, Negative troponin. LFTs mildly elevated. D-Dimer 0.98, CTA negative for PE, CXR with cardiomegaly and CHF.   Stat ECHO ordered in ED with LVEF 20% and grade 2 DD. Mild MR, Mild LAE, RV mildly reduced, PA peak pressure 21 mm Hg.   Pt states she was her usual state of health up until yesterday. 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 Worked yesterday with no problem. Mostly sedentary. Lives at home alone. Able to climb steps and do all of her ADLs at baseline.  States she has had a weight gain since she left hospital in October. Has only had intermittent episodes of SOB if any.  Has not taken any extra diuretics. Denies orthopnea. Taking lasix 40 mg daily. She stopped her lisinopril some time ago. Pt did have a friend that passed away recently from Heart problems.  Remer Macho was planned for today. Pt states she has had a 20 lb weight gain since left the hospital in October. Avoids  salt.  In talking with her daughter symptoms seem more progressive. Says 2 weeks ago she came to stay at her house due to increased dyspnea. Weight has been up 10 pounds for over a week. More difficulty with ADLs.    Given 80 mg IV lasix in ED with 500 cc of UO thus far.   Review of Systems: [y] = yes, [ ]  = no   General: Weight gain [y]; Weight loss [ ] ; Anorexia [ ] ; Fatigue [y]; Fever [ ] ; Chills [ ] ; Weakness Blue.Reese ]  Cardiac: Chest pain/pressure [ ] ; Resting SOB [y]; Exertional SOB [y]; Orthopnea Blue.Reese ]; Pedal Edema [ ] ; Palpitations [ ] ; Syncope [ ] ; Presyncope [ ] ; Paroxysmal nocturnal dyspnea[ ]   Pulmonary: Cough [ ] ; Wheezing[ ] ; Hemoptysis[ ] ; Sputum [ ] ; Snoring [ ]   GI: Vomiting[ ] ; Dysphagia[ ] ; Melena[ ] ; Hematochezia [ ] ; Heartburn[ ] ; Abdominal pain [ ] ; Constipation [ ] ; Diarrhea [ ] ; BRBPR [ ]   GU: Hematuria[ ] ; Dysuria [ ] ; Nocturia[ ]   Vascular: Pain in legs with walking [ ] ; Pain in feet with lying flat [ ] ; Non-healing sores [ ] ; Stroke [ ] ; TIA [ ] ; Slurred speech [ ] ;  Neuro: Headaches[ ] ; Vertigo[ ] ; Seizures[ ] ; Paresthesias[ ] ;Blurred vision [ ] ; Diplopia [ ] ; Vision changes [ ]   Ortho/Skin: Arthritis Blue.Reese ]; Joint pain [ ] ; Muscle pain [ ] ; Joint swelling [ ] ; Back Pain [ ] ;  Rash [ ]   Psych: Depression[ ] ; Anxiety[ ]   Heme: Bleeding problems [ ] ; Clotting disorders [ ] ; Anemia [ ]   Endocrine: Diabetes [ ] ; Thyroid dysfunction[ ]   Home Medications Prior to Admission medications   Medication Sig Start Date End Date Taking? Authorizing Provider  aspirin 81 MG chewable tablet Chew 1 tablet (81 mg total) by mouth daily. 10/06/12  Yes Eugenie Filler, MD  atorvastatin (LIPITOR) 40 MG tablet Take 1 tablet (40 mg total) by mouth daily. 12/22/15  Yes Cheryln Manly, NP  carvedilol (COREG) 3.125 MG tablet Take 1 tablet (3.125 mg total) by mouth 2 (two) times daily with a meal. 12/21/15  Yes Cheryln Manly, NP  furosemide (LASIX) 40 MG tablet Take 1 tablet (40 mg total) by  mouth daily. 12/22/15  Yes Cheryln Manly, NP  insulin NPH-regular Human (NOVOLIN 70/30) (70-30) 100 UNIT/ML injection Please start using 25 units in am and 15 units at bedtime. Check CBG's three times a day (including fasting in am) Patient taking differently: Please start using 15 units in am and 20 units at bedtime. Check CBG's three times a day (including fasting in am) 03/18/16  Yes Barton Dubois, MD  magnesium oxide (MAG-OX) 400 (241.3 Mg) MG tablet Take 1 tablet (400 mg total) by mouth 2 (two) times daily. 12/21/15  Yes Cheryln Manly, NP    Past Medical History: Past Medical History:  Diagnosis Date  . Breast cancer ([place])    breast - left   . CAD (coronary artery disease)    a. nonobs by LHC in 2010 // b. Myoview 10/17: Lg infarct apex, distal ant and lat walls, no ischemia, EF 38; int risk  // c. LHC 10/17:  dLAD 15, pLCx 30, mRCA 40, LVEDP 15  . Chronic combined systolic and diastolic CHF (congestive heart failure) ([place])    a. cMRI 5/10: EF 38% // b. Echo 3/13/: EF 50-55, Gr 1 DD // c. Echo 8/14: EF 40, inf/inf-septal HK, Gr 1 DD, mild MR // d. Echo 5/17: EF 40-45, inf HK, Gr 1 DD, mild MR, mild LAE, reduced RVSF, mild TR, PASP 33 // e. Echo 10/17: EF 40-45, Gr 2 DD, mild MR, severe LAE  . COPD (chronic obstructive pulmonary disease) ([place])   . Diabetes mellitus   . NICM (nonischemic cardiomyopathy) [facility])     Past Surgical History: Past Surgical History:  Procedure Laterality Date  . APPENDECTOMY    . BREAST LUMPECTOMY     lt breast  . CARDIAC CATHETERIZATION N/A 12/20/2015   Procedure: Left Heart Cath and Coronary Angiography;  Surgeon: Nelva Bush, MD;  Location: [facility];  Service: Cardiovascular;  Laterality: N/A;    Family History: Family History  Problem Relation Age of Onset  . Stroke Father     Social History: Social History   Social History  . Marital status: Widowed    Spouse name: N/A  . Number of children: N/A  . Years of education:  N/A   Social History Main Topics  . Smoking status: Former Smoker    Years: 30.00    Quit date: 09/29/2012  . Smokeless tobacco: Never Used  . Alcohol use No  . Drug use: No  . Sexual activity: Not Asked   Other Topics Concern  . None   Social History Narrative  . None    Allergies:  Allergies  Allergen Reactions  . Tape Rash    Pt can tolerate the paper tape  Objective:    Vital Signs:   Temp:  [96.2 F (35.7 C)] 96.2 F (35.7 C) (03/16 0737) Pulse Rate:  [25-117] 72 (03/16 1055) Resp:  [16-30] 16 (03/16 1055) BP: (94-134)/(57-113) 96/65 (03/16 1055) SpO2:  [100 %] 100 % (03/16 1055) FiO2 (%):  [50 %] 50 % (03/16 0742) Weight:  [168 lb (76.2 kg)] 168 lb (76.2 kg) (03/16 0738)    Weight change: Filed Weights   05/05/16 0738  Weight: 168 lb (76.2 kg)    Intake/Output:   Intake/Output Summary (Last 24 hours) at 05/05/16 1116 Last data filed at 05/05/16 1056  Gross per 24 hour  Intake                0 ml  Output              500 ml  Net             -500 ml     Physical Exam: General:  Elderly appearing. NAD at rest. Daughter at bedside HEENT: 02 via [place] Neck: supple. JVP elevated to jaw. Carotids 2+ bilat; no bruits. No lymphadenopathy or thyromegaly appreciated. Cor: PMI nondisplaced. Regular with occasional ectopy. No M/G/R. No s3 Lungs: Mildly diminished with bibasilar crackles. No wheeze Abdomen: soft, nontender, nondistended. No hepatosplenomegaly. No bruits or masses. Good bowel sounds. Extremities: no cyanosis, clubbing, rash, Trace edema, slightly cool to the touch.  Neuro: alert & orientedx3, cranial nerves grossly intact. moves all 4 extremities w/o difficulty. Affect flat but appropriate. Sad at times   Telemetry: Personally reviewed, NSR with PVCs  Labs: Basic Metabolic Panel:  Recent Labs Lab 05/05/16 0720  NA 139  K 4.4  CL 103  CO2 23  GLUCOSE 306*  BUN 25*  CREATININE 1.42*  CALCIUM 9.0    Liver Function Tests:  Recent  Labs Lab 05/05/16 0720  AST 71*  ALT 80*  ALKPHOS 107  BILITOT 0.5  PROT 6.3*  ALBUMIN 3.6   No results for input(s): LIPASE, AMYLASE in the last 168 hours. No results for input(s): AMMONIA in the last 168 hours.  CBC:  Recent Labs Lab 05/05/16 0720  WBC 10.6*  HGB 12.1  HCT 36.4  MCV 94.5  PLT 232    Cardiac Enzymes: No results for input(s): CKTOTAL, CKMB, CKMBINDEX, TROPONINI in the last 168 hours.  BNP: BNP (last 3 results)  Recent Labs  12/17/15 0318 03/17/16 0119 05/05/16 0720  BNP 657.7* 362.7* 866.9*    ProBNP (last 3 results) No results for input(s): PROBNP in the last 8760 hours.   CBG:  Recent Labs Lab 05/05/16 0719  GLUCAP 287*    Coagulation Studies: No results for input(s): LABPROT, INR in the last 72 hours.  Other results: EKG: Sinus tach with PVCs/trigeminy. Incomplete LBBB qrs 108 ms  Imaging: Ct Angio Chest Pe W Or Wo Contrast  Result Date: 05/05/2016 CLINICAL DATA:  Respiratory distress. EXAM: CT ANGIOGRAPHY CHEST WITH CONTRAST TECHNIQUE: Multidetector CT imaging of the chest was performed using the standard protocol during bolus administration of intravenous contrast. Multiplanar CT image reconstructions and MIPs were obtained to evaluate the vascular anatomy. CONTRAST:  80 cc Isovue 370 COMPARISON:  Chest radiography same day.  CT 07/05/2008. FINDINGS: Cardiovascular: Pulmonary arterial opacification is good. There are no pulmonary emboli. There is aortic atherosclerosis but no sign of aneurysm. Contrast is not yet reached the systemic circulation. Coronary artery calcification is noted. No pericardial fluid. Mediastinum/Nodes: No mass or lymphadenopathy. Lungs/Pleura: Small bilateral pleural effusions layering  dependently. Interstitial edema pattern in the lower lungs. No consolidation or collapse. Mild peripheral scarring on the left, probably related to previous radiation. Upper Abdomen: Negative Musculoskeletal: Chronic degenerative  thoracic spondylosis. Review of the MIP images confirms the above findings. IMPRESSION: No pulmonary emboli. Mild interstitial edema pattern.  Small pleural effusions. Peripheral pulmonary scarring on the left related to previous chest radiation. Electronically Signed   By: Nelson Chimes M.D.   On: 05/05/2016 09:10   Dg Chest Portable 1 View  Result Date: 05/05/2016 CLINICAL DATA:  Shortness of breath. EXAM: PORTABLE CHEST 1 VIEW COMPARISON:  03/16/2016. FINDINGS: Cardiomegaly with pulmonary vascular prominence and bilateral interstitial prominence consistent with CHF. No pleural effusion or pneumothorax. No acute bony abnormality . IMPRESSION: Cardiomegaly with pulmonary vascular prominence and bilateral interstitial prominence consistent with CHF. Electronically Signed   By: Marcello Moores  Register   On: 05/05/2016 07:44      Medications:     Current Medications: . atorvastatin  40 mg Oral Daily  . carvedilol  3.125 mg Oral BID WC  . insulin aspart  0-15 Units Subcutaneous TID WC  . insulin aspart protamine- aspart  15 Units Subcutaneous BID WC     Infusions:    Assessment/Plan   1. A/C systolic CHF - LVEF 62%, Grade 2 DD. Mild MR, Mild LAE, RV mildly reduced, PA peak pressure 21 mm Hg.  2. HTN 3. Poorly controlled DM2 4. Acute hypoxic respiratory failure 5. AKI  6. Frequent PVCs  Markedly volume overloaded on exam. JVP to jaw. Extremities slightly cool. Will diurese on IV lasix 80 mg BID for now.   Will hold BB for now with questionable output.   Will start on spiro 12.5 mg daily and follow pressures closely  Start digoxin 0.125 mg daily. Will check level in 7-10 days.   Per daughter, decline seems more gradual than patient describes with weight gain, progressive DOE, and fatigue over the past couple months.   Echo with reduced EF when compared to studies in October. No CP. Initial troponin negative.   Continue 02 via [place]. Will need to be ambulated prior to discharge to assess  02 need.   Length of Stay: 0  Annamaria Helling  05/05/2016, 11:16 AM  Advanced Heart Failure Team Pager [phone] (M-F; 7a - 4p)  Please contact [facility] for night-coverage after hours (4p -7a ) and weekends on [email or web address]  Patient seen and examined with Oda Kilts, PA-C. We discussed all aspects of the encounter. I agree with the assessment and plan as stated above.   I have reviewed CXR, echo and previous cath films personally.   71 y/o woman with NICM previously followed in HF clinic but has not followed up for many years. Now admitted with acute respiratory failure requiring BIPAP in ED. Improved with diuresis. Echo with EF 20% and mild RV HK. Still volume overloaded on exam. Initially decompensation seemed very acute (?tako-tsubo component in setting of recent death of friend) however daughter reports more progressive picture. I suspect she is borderline low output. Will adjust HF meds> continue IV diuresis. Plan RHC on Monday. May need to conddier advanced therapies.   D/w IMTS personally.   Bensimhon, Daniel,MD 3:51 PM

## 2016-05-05 NOTE — Progress Notes (Signed)
Dr. Haroldine Laws with PA came to see patient,made aware of Trop level 0.08.

## 2016-05-05 NOTE — Care Management Note (Signed)
Case Management Note  Patient Details  Name: NIAMBI SMOAK MRN: [id number] Date of Birth: [birth year]  Subjective/Objective:     Admitted with CHF              Action/Plan: Patient is known to me from previous admission; CM will continue to follow for DCP  12/07/2014- Patient is independent of her ADL's continue to work 30 hrs a week at [employer]; Goes to the [employer] for primary care; [employer] with Medicare part A; patient stated that she is exploring Medicare part B and D; CM talked to the patient of the importance of having medical / prescription drug coverage; pt acknowledged understanding. No needs identified. CM will continue to follow for dcp; Mindi Slicker RN  Expected Discharge Date:    possibly 05/08/2016              Expected Discharge Plan:  Home/Self Care  Discharge planning Services  CM Consult   Status of Service:  In process, will continue to follow  Sherrilyn Rist [phone] 05/05/2016, 2:55 PM

## 2016-05-05 NOTE — Procedures (Signed)
Pt taken off Bipap after CT trip, placed on 2L [place] tolerating well, no distress noted at this time.

## 2016-05-05 NOTE — ED Triage Notes (Signed)
Pt here via [facility] on C-Pap.  [facility] called for respiratory distress.  No wheezes noted, thus no meds given.  Sats of 100% on bipap,.  Congestion noted to bases.

## 2016-05-05 NOTE — H&P (Signed)
Date: 05/05/2016               Patient Name:  [name] MRN: [id number]  DOB: 1945-08-04 Age / Sex: 71 y.o., female   PCP: Javier Docker, MD         Medical Service: Internal Medicine Teaching Service         Attending Physician: Dr. Fredia Sorrow, MD    First Contact: Dr. Einar Gip Pager: [phone]  Second Contact: Dr. 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 His daughter was at bedside and contributed to the interview.  Around 530 AM, she was in the bathroom washing her face when she suddenly felt it hard to breathe and was wheezing. She felt her heart racing and was using her stomach to breathe, so EMS was called. She received CPAP in the field and was transitioned to BiPAP upon arrival though her difficulty breathing had improved markedly. 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 Her dyspnea has limited her physical activity over the last 2 years, and she avoids walking on the treadmill or exerting herself out of fear of precipitating one of these episodes. She denied any associated chest pain, worsening leg swelling, orthopnea, dietary changes though has a poor appetite with her diabetes, sick contacts, recent acute illness.   At her hospitalization in October/November 2017, echo noted EF 92-42%, grade 2 diastolic dysfunction, severe left atrial dilation. Subsequent  cardiac catheterization revealed mild to moderate non-obstructive coronary artery disease with recommendations for outpatient follow-up though she did not and was readmitted in January/February 2018 for diuresis again. She works as a Research scientist (physical sciences) at [employer] and smoked 50 years of unknown quantity though quit over the last year. She denies alcohol or illicit drug use.   In the ED, initial labwork noted Crt 1.4 [baseline 0.9-1.1], transaminitis [AST 71, ALT 80], elevated BNP 866.9, negative troponin x 1. D-dimer was elevated at 0.98 though CTA was without PE. She received Lasix 80 mg IV.   Meds:  Current Meds  Medication Sig  . aspirin 81 MG chewable tablet Chew 1 tablet (81 mg total) by mouth daily.  Marland Kitchen atorvastatin (LIPITOR) 40 MG tablet Take 1 tablet (40 mg total) by mouth daily.  . carvedilol (COREG) 3.125 MG tablet Take 1 tablet (3.125 mg total) by mouth 2 (two) times daily with a meal.  . furosemide (LASIX) 40 MG tablet Take 1 tablet (40 mg total) by mouth daily.  . insulin NPH-regular Human (NOVOLIN 70/30) (70-30) 100 UNIT/ML injection Please start using 25 units in am and 15 units at bedtime. Check CBG's three times a day (including fasting in am) (Patient taking differently: Please start using 15 units in am and 20 units at bedtime. Check CBG's three times a day (including fasting in am))  . magnesium oxide (MAG-OX) 400 (241.3 Mg) MG tablet Take 1 tablet (400 mg total) by mouth  2 (two) times daily.     Allergies: Allergies as of 05/05/2016 - Review Complete 05/05/2016  Allergen Reaction Noted  . Tape Rash 05/05/2016   Past Medical History:  Diagnosis Date  . Breast cancer ([place])    breast - left   . CAD (coronary artery disease)    a. nonobs by LHC in 2010 // b. Myoview 10/17: Lg infarct apex, distal ant and lat walls, no ischemia, EF 38; int risk  // c. LHC 10/17:  dLAD 15, pLCx 30, mRCA 40, LVEDP 15  . Chronic combined systolic and diastolic CHF (congestive heart failure) ([place])     a. cMRI 5/10: EF 38% // b. Echo 3/13/: EF 50-55, Gr 1 DD // c. Echo 8/14: EF 40, inf/inf-septal HK, Gr 1 DD, mild MR // d. Echo 5/17: EF 40-45, inf HK, Gr 1 DD, mild MR, mild LAE, reduced RVSF, mild TR, PASP 33 // e. Echo 10/17: EF 40-45, Gr 2 DD, mild MR, severe LAE  . COPD (chronic obstructive pulmonary disease) ([place])   . Diabetes mellitus   . NICM (nonischemic cardiomyopathy) ([place])     Family History: No heart of lung disease.  Social History: As noted in the HPI.  Review of Systems: Episodic sweating, dizziness associated with hypoglycemia. A complete ROS was negative except as per HPI.   Physical Exam: Blood pressure (!) 94/57, pulse (!) 25, temperature (!) 96.2 F (35.7 C), temperature source Axillary, resp. rate 16, height 5' 3.5" (1.613 m), weight 168 lb (76.2 kg), SpO2 100 %. Physical Exam  Constitutional: She is oriented to person, place, and time. No distress.  HENT:  Head: Normocephalic and atraumatic.  Eyes: Conjunctivae are normal. No scleral icterus.  Neck: JVD: Unable to visualize.  Cardiovascular: Normal rate.   Distant heart sounds.  Pulmonary/Chest: Effort normal. No respiratory distress. She has no wheezes.  Abdominal: Soft. She exhibits no distension. There is no tenderness.  Neurological: She is alert and oriented to person, place, and time.  Skin: Skin is warm. She is not diaphoretic.  Hands cool to touch, feet warm to touch.   EKG: I reviewed and compared with 03/16/16. Tachycardic, normal axis PVCs, IVCD.  CXR: I reviewed and compared with 03/16/16. Limited by inspiratory effort. Increased interstitial edema bilaterally.  Assessment & Plan by Problem: Principal Problem:   Acute on chronic combined systolic and diastolic CHF (congestive heart failure) (HCC) Active Problems:   Type 2 diabetes mellitus without complication, with long-term current use of insulin (HCC)   Hypertension   Coronary artery disease involving native heart without angina pectoris    NICM (nonischemic cardiomyopathy) [facility])  [name] is a 71 year old woman with heart failure with reduced ejection fraction, history of tobacco abuse, poorly controlled insulin-dependent Type 2 diabetes, hypertension hospitalized for acute on chronic combined CHF found to have transaminitis and acute kidney injury.  Acute on chronic combined CHF: Suspect her underlying disease has progressed as her last workup does not seem consistent with her presentation and history. She does not have atherosclerotic disease nor prior history of lung disease though certainly some component of COPD is likely present with her history of tobacco abuse.  -Consult Heart Failure given her increasing frequency of admissions and possible need for ionotropes given transaminitis and acute kidney injury. -Trend troponins x 3 to exclude ACS -Continue aspirin 81 mg, carvedilol 3.125 mg twice daily -Holding lisinopril given low blood pressure seen in the room. -Continue diuresis with furosemide 40 mg IV twice daily -Check  daily weights, in&outs  Insulin-dependent Type 2 diabetes: A1c 9.2, January 2018  -Continue Novolin 70/30 15 units twice daily with moderate sensitive sliding scale insulin  Dispo: Admit patient to Inpatient with expected length of stay greater than 2 midnights.  Signed: Riccardo Dubin, MD 05/05/2016, 10:44 AM  Pager: [phone]

## 2016-05-05 NOTE — ED Provider Notes (Signed)
[facility] DEPT Provider Note   CSN: [id number] Arrival date & time: 05/05/16  8676     History   Chief Complaint Chief Complaint  Patient presents with  . Respiratory Distress    HPI [name] is a 71 y.o. female.  The patient brought in by EMS for sudden respiratory distress. Patient does have a history of coronary disease as well as congestive heart failure. Patient noted her blood sugar was high this morning she took some of her 7030 insulin and then suddenly had significant shortness of breath. EMS started her on Cipro. We switched her over to BiPAP upon arrival. Upon arrival here lungs sound clear oxygen saturations were in the upper 90s. Patient simply went on to settle down. There was no swelling in her legs. Family members state this is usually congestive heart failure that causes these symptoms. Patient given 80 of Lasix here, she did not take any of her diuretics morning. Patient denied any chest pain.      Past Medical History:  Diagnosis Date  . Breast cancer ([place])    breast - left   . CAD (coronary artery disease)    a. nonobs by LHC in 2010 // b. Myoview 10/17: Lg infarct apex, distal ant and lat walls, no ischemia, EF 38; int risk  // c. LHC 10/17:  dLAD 15, pLCx 30, mRCA 40, LVEDP 15  . Chronic combined systolic and diastolic CHF (congestive heart failure) ([place])    a. cMRI 5/10: EF 38% // b. Echo 3/13/: EF 50-55, Gr 1 DD // c. Echo 8/14: EF 40, inf/inf-septal HK, Gr 1 DD, mild MR // d. Echo 5/17: EF 40-45, inf HK, Gr 1 DD, mild MR, mild LAE, reduced RVSF, mild TR, PASP 33 // e. Echo 10/17: EF 40-45, Gr 2 DD, mild MR, severe LAE  . COPD (chronic obstructive pulmonary disease) ([place])   . Diabetes mellitus   . NICM (nonischemic cardiomyopathy) [facility])     Patient Active Problem List   Diagnosis Date Noted  . Hyperglycemia   . CKD (chronic kidney disease), stage II 03/17/2016  . Palpitation 03/17/2016  . Elevated troponin   . Pure hypercholesterolemia   .  Tobacco abuse   . COPD (chronic obstructive pulmonary disease) ([place]) 06/28/2015  . Syncope 06/28/2015  . Chronic combined systolic and diastolic CHF (congestive heart failure) ([place]) 06/28/2015  . NICM (nonischemic cardiomyopathy) ([place]) 10/05/2012  . Abdominal mass, LLQ (left lower quadrant) 05/03/2012  . PULMONARY NODULE 08/05/2008  . Palpitations 08/05/2008  . Type 2 diabetes mellitus without complication, with long-term current use of insulin ([place]) 07/16/2008  . HYPERTENSION, CONTROLLED 07/16/2008  . Coronary artery disease involving native heart without angina pectoris 07/16/2008    Past Surgical History:  Procedure Laterality Date  . APPENDECTOMY    . BREAST LUMPECTOMY     lt breast  . CARDIAC CATHETERIZATION N/A 12/20/2015   Procedure: Left Heart Cath and Coronary Angiography;  Surgeon: Nelva Bush, MD;  Location: [facility];  Service: Cardiovascular;  Laterality: N/A;    OB History    No data available       Home Medications    Prior to Admission medications   Medication Sig Start Date End Date Taking? Authorizing Provider  aspirin 81 MG chewable tablet Chew 1 tablet (81 mg total) by mouth daily. 10/06/12   Eugenie Filler, MD  atorvastatin (LIPITOR) 40 MG tablet Take 1 tablet (40 mg total) by mouth daily. 12/22/15   Rosalene Billings  Mancel Bale, NP  carvedilol (COREG) 3.125 MG tablet Take 1 tablet (3.125 mg total) by mouth 2 (two) times daily with a meal. 12/21/15   Cheryln Manly, NP  furosemide (LASIX) 40 MG tablet Take 1 tablet (40 mg total) by mouth daily. 12/22/15   Cheryln Manly, NP  insulin NPH-regular Human (NOVOLIN 70/30) (70-30) 100 UNIT/ML injection Please start using 25 units in am and 15 units at bedtime. Check CBG's three times a day (including fasting in am) 03/18/16   Barton Dubois, MD  magnesium oxide (MAG-OX) 400 (241.3 Mg) MG tablet Take 1 tablet (400 mg total) by mouth 2 (two) times daily. 12/21/15   Cheryln Manly, NP    Family  History Family History  Problem Relation Age of Onset  . Stroke Father     Social History Social History  Substance Use Topics  . Smoking status: Former Smoker    Years: 30.00    Quit date: 09/29/2012  . Smokeless tobacco: Never Used  . Alcohol use No     Allergies   Tape   Review of Systems Review of Systems  Constitutional: Negative for fever.  HENT: Negative for congestion and sore throat.   Respiratory: Positive for shortness of breath.   Cardiovascular: Negative for chest pain.  Gastrointestinal: Positive for abdominal distention. Negative for nausea and vomiting.  Genitourinary: Negative for dysuria.  Musculoskeletal: Negative for back pain.  Neurological: Negative for headaches.  Hematological: Does not bruise/bleed easily.  Psychiatric/Behavioral: Negative for confusion.     Physical Exam Updated Vital Signs BP (!) 94/57   Pulse (!) 25   Temp (!) 96.2 F (35.7 C) (Axillary)   Resp 16   Ht 5' 3.5" (1.613 m)   Wt 76.2 kg   SpO2 100%   BMI 29.29 kg/m   Physical Exam  Constitutional: She is oriented to person, place, and time. She appears well-developed and well-nourished. She appears distressed.  HENT:  Head: Normocephalic and atraumatic.  Mouth/Throat: Oropharynx is clear and moist.  Eyes: Conjunctivae and EOM are normal. Pupils are equal, round, and reactive to light.  Neck: Neck supple.  Cardiovascular:  Irregular.  Pulmonary/Chest: Breath sounds normal. She is in respiratory distress. She has no rales.  Abdominal: Soft. Bowel sounds are normal. There is no tenderness.  Musculoskeletal: She exhibits no edema.  Neurological: She is alert and oriented to person, place, and time. No cranial nerve deficit or sensory deficit. She exhibits normal muscle tone. Coordination normal.  Skin: Skin is warm.  Nursing note and vitals reviewed.    ED Treatments / Results  Labs (all labs ordered are listed, but only abnormal results are displayed) Labs  Reviewed  CBC - Abnormal; Notable for the following:       Result Value   WBC 10.6 (*)    RBC 3.85 (*)    All other components within normal limits  BRAIN NATRIURETIC PEPTIDE - Abnormal; Notable for the following:    B Natriuretic Peptide 866.9 (*)    All other components within normal limits  COMPREHENSIVE METABOLIC PANEL - Abnormal; Notable for the following:    Glucose, Bld 306 (*)    BUN 25 (*)    Creatinine, Ser 1.42 (*)    Total Protein 6.3 (*)    AST 71 (*)    ALT 80 (*)    GFR calc non Af Amer 36 (*)    GFR calc Af Amer 42 (*)    All other components within normal limits  D-DIMER, QUANTITATIVE (NOT AT [facility]) - Abnormal; Notable for the following:    D-Dimer, Quant 0.98 (*)    All other components within normal limits  CBG MONITORING, ED - Abnormal; Notable for the following:    Glucose-Capillary 287 (*)    All other components within normal limits  I-STAT TROPOININ, ED    EKG  EKG Interpretation  Date/Time:  Friday May 05 2016 07:15:00 EDT Ventricular Rate:  118 PR Interval:    QRS Duration: 108 QT Interval:  324 QTC Calculation: 454 R Axis:   -42 Text Interpretation:  Sinus tachycardia Ventricular trigeminy Probable left atrial enlargement Incomplete left bundle branch block Low voltage, precordial leads Anterior Q waves, possibly due to ILBBB Baseline wander in lead(s) V5 No significant change since last tracing Confirmed by Raquan Iannone  MD, Amity Roes ([id number]) on 05/05/2016 7:50:21 AM       Radiology Ct Angio Chest Pe W Or Wo Contrast  Result Date: 05/05/2016 CLINICAL DATA:  Respiratory distress. EXAM: CT ANGIOGRAPHY CHEST WITH CONTRAST TECHNIQUE: Multidetector CT imaging of the chest was performed using the standard protocol during bolus administration of intravenous contrast. Multiplanar CT image reconstructions and MIPs were obtained to evaluate the vascular anatomy. CONTRAST:  80 cc Isovue 370 COMPARISON:  Chest radiography same day.  CT 07/05/2008. FINDINGS:  Cardiovascular: Pulmonary arterial opacification is good. There are no pulmonary emboli. There is aortic atherosclerosis but no sign of aneurysm. Contrast is not yet reached the systemic circulation. Coronary artery calcification is noted. No pericardial fluid. Mediastinum/Nodes: No mass or lymphadenopathy. Lungs/Pleura: Small bilateral pleural effusions layering dependently. Interstitial edema pattern in the lower lungs. No consolidation or collapse. Mild peripheral scarring on the left, probably related to previous radiation. Upper Abdomen: Negative Musculoskeletal: Chronic degenerative thoracic spondylosis. Review of the MIP images confirms the above findings. IMPRESSION: No pulmonary emboli. Mild interstitial edema pattern.  Small pleural effusions. Peripheral pulmonary scarring on the left related to previous chest radiation. Electronically Signed   By: Nelson Chimes M.D.   On: 05/05/2016 09:10   Dg Chest Portable 1 View  Result Date: 05/05/2016 CLINICAL DATA:  Shortness of breath. EXAM: PORTABLE CHEST 1 VIEW COMPARISON:  03/16/2016. FINDINGS: Cardiomegaly with pulmonary vascular prominence and bilateral interstitial prominence consistent with CHF. No pleural effusion or pneumothorax. No acute bony abnormality . IMPRESSION: Cardiomegaly with pulmonary vascular prominence and bilateral interstitial prominence consistent with CHF. Electronically Signed   By: Marcello Moores  Register   On: 05/05/2016 07:44    Procedures Procedures (including critical care time)  CRITICAL CARE Performed by: Fredia Sorrow Total critical care time: 30 minutes Critical care time was exclusive of separately billable procedures and treating other patients. Critical care was necessary to treat or prevent imminent or life-threatening deterioration. Critical care was time spent personally by me on the following activities: development of treatment plan with patient and/or surrogate as well as nursing, discussions with consultants,  evaluation of patient's response to treatment, examination of patient, obtaining history from patient or surrogate, ordering and performing treatments and interventions, ordering and review of laboratory studies, ordering and review of radiographic studies, pulse oximetry and re-evaluation of patient's condition.   Medications Ordered in ED Medications  furosemide (LASIX) injection 80 mg (80 mg Intravenous Given 05/05/16 0753)  iopamidol (ISOVUE-370) 76 % injection (80 mLs  Contrast Given 05/05/16 0852)     Initial Impression / Assessment and Plan / ED Course  I have reviewed the triage vital signs and the nursing notes.  Pertinent labs & imaging results that  were available during my care of the patient were reviewed by me and considered in my medical decision making (see chart for details).    Patient arrived by EMS on C Pap. Patient still in respiratory distress. Switched over to BiPAP here. Shortly after going on BiPAP settle down snugly from a breathing standpoint. Chest x-ray she was consistent with some CHF. No leg edema. Patient denied any chest pain. Most likely this was exacerbation of her CHF. Maben's flash pulmonary edema that was helped by the positive pressure of the C Pap on the way in. Patient's initial troponins negative. EKG without acute changes. Does have frequent PVCs as per previous EKGs. Patient eventually removed from the BiPAP did have CT of chest done which was negative for pulmonary embolus. This was ordered based on the fact the d-dimer was elevated. Patient combination of the BiPAP and the 80 mg of Lasix feels much better. Also blood pressure has improved.  Discussed with the undersigned admitting team they will admit her. Patient now markedly improved off of BiPAP. Patient mentating fine. Patient feels much more comfortable.   Final Clinical Impressions(s) / ED Diagnoses   Final diagnoses:  Respiratory distress  Acute on chronic combined systolic and diastolic  congestive heart failure [facility])    New Prescriptions New Prescriptions   No medications on file     Fredia Sorrow, MD 05/05/16 1030

## 2016-05-06 DIAGNOSIS — J9601 Acute respiratory failure with hypoxia: Secondary | ICD-10-CM | POA: Diagnosis present

## 2016-05-06 DIAGNOSIS — I5043 Acute on chronic combined systolic (congestive) and diastolic (congestive) heart failure: Secondary | ICD-10-CM | POA: Diagnosis present

## 2016-05-06 DIAGNOSIS — Z823 Family history of stroke: Secondary | ICD-10-CM

## 2016-05-06 DIAGNOSIS — R945 Abnormal results of liver function studies: Secondary | ICD-10-CM

## 2016-05-06 DIAGNOSIS — N182 Chronic kidney disease, stage 2 (mild): Secondary | ICD-10-CM | POA: Diagnosis present

## 2016-05-06 DIAGNOSIS — Z9221 Personal history of antineoplastic chemotherapy: Secondary | ICD-10-CM | POA: Diagnosis not present

## 2016-05-06 DIAGNOSIS — R509 Fever, unspecified: Secondary | ICD-10-CM | POA: Diagnosis not present

## 2016-05-06 DIAGNOSIS — R0600 Dyspnea, unspecified: Secondary | ICD-10-CM | POA: Diagnosis present

## 2016-05-06 DIAGNOSIS — J449 Chronic obstructive pulmonary disease, unspecified: Secondary | ICD-10-CM | POA: Diagnosis present

## 2016-05-06 DIAGNOSIS — I493 Ventricular premature depolarization: Secondary | ICD-10-CM | POA: Diagnosis not present

## 2016-05-06 DIAGNOSIS — Z7982 Long term (current) use of aspirin: Secondary | ICD-10-CM

## 2016-05-06 DIAGNOSIS — Z923 Personal history of irradiation: Secondary | ICD-10-CM | POA: Diagnosis not present

## 2016-05-06 DIAGNOSIS — E1122 Type 2 diabetes mellitus with diabetic chronic kidney disease: Secondary | ICD-10-CM | POA: Diagnosis present

## 2016-05-06 DIAGNOSIS — Z79899 Other long term (current) drug therapy: Secondary | ICD-10-CM

## 2016-05-06 DIAGNOSIS — I2510 Atherosclerotic heart disease of native coronary artery without angina pectoris: Secondary | ICD-10-CM | POA: Diagnosis present

## 2016-05-06 DIAGNOSIS — I248 Other forms of acute ischemic heart disease: Secondary | ICD-10-CM | POA: Diagnosis present

## 2016-05-06 DIAGNOSIS — R0603 Acute respiratory distress: Secondary | ICD-10-CM

## 2016-05-06 DIAGNOSIS — Z794 Long term (current) use of insulin: Secondary | ICD-10-CM | POA: Diagnosis not present

## 2016-05-06 DIAGNOSIS — R740 Nonspecific elevation of levels of transaminase and lactic acid dehydrogenase [LDH]: Secondary | ICD-10-CM | POA: Diagnosis present

## 2016-05-06 DIAGNOSIS — E1165 Type 2 diabetes mellitus with hyperglycemia: Secondary | ICD-10-CM | POA: Diagnosis present

## 2016-05-06 DIAGNOSIS — N179 Acute kidney failure, unspecified: Secondary | ICD-10-CM | POA: Diagnosis present

## 2016-05-06 DIAGNOSIS — Z23 Encounter for immunization: Secondary | ICD-10-CM | POA: Diagnosis present

## 2016-05-06 DIAGNOSIS — I428 Other cardiomyopathies: Secondary | ICD-10-CM | POA: Diagnosis present

## 2016-05-06 DIAGNOSIS — Z87891 Personal history of nicotine dependence: Secondary | ICD-10-CM | POA: Diagnosis not present

## 2016-05-06 DIAGNOSIS — I509 Heart failure, unspecified: Secondary | ICD-10-CM

## 2016-05-06 DIAGNOSIS — I130 Hypertensive heart and chronic kidney disease with heart failure and stage 1 through stage 4 chronic kidney disease, or unspecified chronic kidney disease: Secondary | ICD-10-CM | POA: Diagnosis present

## 2016-05-06 DIAGNOSIS — Z853 Personal history of malignant neoplasm of breast: Secondary | ICD-10-CM | POA: Diagnosis not present

## 2016-05-06 DIAGNOSIS — Z86718 Personal history of other venous thrombosis and embolism: Secondary | ICD-10-CM | POA: Diagnosis not present

## 2016-05-06 LAB — CBC
HCT: 32 % — ABNORMAL LOW (ref 36.0–46.0)
HEMOGLOBIN: 10.7 g/dL — AB (ref 12.0–15.0)
MCH: 31.2 pg (ref 26.0–34.0)
MCHC: 33.4 g/dL (ref 30.0–36.0)
MCV: 93.3 fL (ref 78.0–100.0)
PLATELETS: 188 10*3/uL (ref 150–400)
RBC: 3.43 MIL/uL — AB (ref 3.87–5.11)
RDW: 14.1 % (ref 11.5–15.5)
WBC: 7.1 10*3/uL (ref 4.0–10.5)

## 2016-05-06 LAB — GLUCOSE, CAPILLARY
GLUCOSE-CAPILLARY: 117 mg/dL — AB (ref 65–99)
GLUCOSE-CAPILLARY: 196 mg/dL — AB (ref 65–99)
Glucose-Capillary: 219 mg/dL — ABNORMAL HIGH (ref 65–99)
Glucose-Capillary: 178 mg/dL — ABNORMAL HIGH (ref 65–99)

## 2016-05-06 LAB — COMPREHENSIVE METABOLIC PANEL
ALBUMIN: 3.1 g/dL — AB (ref 3.5–5.0)
ALK PHOS: 85 U/L (ref 38–126)
ALT: 59 U/L — AB (ref 14–54)
AST: 38 U/L (ref 15–41)
Anion gap: 4 — ABNORMAL LOW (ref 5–15)
BUN: 28 mg/dL — ABNORMAL HIGH (ref 6–20)
CALCIUM: 9 mg/dL (ref 8.9–10.3)
CHLORIDE: 105 mmol/L (ref 101–111)
CO2: 30 mmol/L (ref 22–32)
CREATININE: 1.22 mg/dL — AB (ref 0.44–1.00)
GFR calc non Af Amer: 43 mL/min — ABNORMAL LOW (ref >60)
GFR, EST AFRICAN AMERICAN: 50 mL/min — AB (ref >60)
GLUCOSE: 127 mg/dL — AB (ref 65–99)
Potassium: 3.8 mmol/L (ref 3.5–5.1)
SODIUM: 139 mmol/L (ref 135–145)
Total Bilirubin: 0.5 mg/dL (ref 0.3–1.2)
Total Protein: 5.6 g/dL — ABNORMAL LOW (ref 6.5–8.1)

## 2016-05-06 MED ORDER — ENOXAPARIN SODIUM 40 MG/0.4ML ~~LOC~~ SOLN
40.0000 mg | SUBCUTANEOUS | Status: DC
  Administered 2016-05-06 – 2016-05-09 (×3): 40 mg via SUBCUTANEOUS
  Filled 2016-05-06 (×3): qty 0.4

## 2016-05-06 MED ORDER — SPIRONOLACTONE 25 MG PO TABS
25.0000 mg | ORAL_TABLET | Freq: Every day | ORAL | Status: DC
  Administered 2016-05-06 – 2016-05-09 (×4): 25 mg via ORAL
  Filled 2016-05-06 (×4): qty 1

## 2016-05-06 MED ORDER — FUROSEMIDE 40 MG PO TABS
40.0000 mg | ORAL_TABLET | Freq: Two times a day (BID) | ORAL | Status: DC
  Administered 2016-05-06 – 2016-05-09 (×6): 40 mg via ORAL
  Filled 2016-05-06 (×6): qty 1

## 2016-05-06 MED ORDER — LOSARTAN POTASSIUM 25 MG PO TABS
12.5000 mg | ORAL_TABLET | Freq: Two times a day (BID) | ORAL | Status: DC
  Administered 2016-05-06 – 2016-05-07 (×2): 12.5 mg via ORAL
  Filled 2016-05-06 (×2): qty 1

## 2016-05-06 NOTE — Progress Notes (Addendum)
   Subjective: [name] was seen and evaluated today at bedside. She reports she is feeling better since admission and her SOB improved after starting Lasix. She reports a several month history of worsening SOB. Her best friends husband passed within the past few weeks and has been depressed since then. She endorses drinking an excess of liquids, sometimes several pots of coffee a day and several glasses of tea a day.   Objective:  Vital signs in last 24 hours: Vitals:   05/05/16 1609 05/05/16 1954 05/06/16 0034 05/06/16 0505  BP: 106/75 109/65 (!) 99/48 (!) 109/47  Pulse: 83 81 67 63  Resp: 18 18 16 16   Temp: 97.5 F (36.4 C) 98 F (36.7 C) 97.8 F (36.6 C) 97.9 F (36.6 C)  TempSrc: Oral Oral Oral Oral  SpO2: 100% 100% 100% 99%  Weight:    165 lb 9.6 oz (75.1 kg)  Height:       General: Very pleasant african [place] female resting comfortably in bed. In no acute distress. Open to interview.  HENT: EOMI. No conjunctival injection, icterus or ptosis. Mucous membranes moist.  Cardiovascular: Regular rate and rhythm. No murmur or rub appreciated. Pulmonary: Faint bibasilar crackles. Unlabored breathing.  Abdomen: Soft, non-tender. No guarding or rigidity. +bowel sounds.  Skin: Warm, dry. No cyanosis.  Psych: Mood normal and affect was mood congruent. Responds to questions appropriately.   Assessment/Plan:  Principal Problem:   Acute on chronic combined systolic and diastolic CHF (congestive heart failure) (H[facility]) Active Problems:   Type 2 diabetes mellitus without complication, with long-term current use of insulin ([facility])   Hypertension   Coronary artery disease involving native heart without angina pectoris   NICM (nonischemic cardiomyopathy) ([facility])  Acute on Chronic Combined Systolic and Diastolic CHF: ECHO LVEF at 20% with grade 2 diastolic dysfunction. This has worsened significantly since her last one 11/2015 which showed an LVEF of 40-45%, also with grade 2 diastolic  dysfunction. She does endorse excess volume intake, upwards of 2 gallons a day. This was addressed with the patient today however will need to continue emphasizing the importance of fluid restriction. Advanced HF team is on board and are currently diuresing her with IV Lasix 80 mg BID. She is down about 3 lbs since admission. Believes her dry weight is ~ 155 however chart review shows variable weights. Renal function improved today with diuresis.  -Continue diuresis per HF team -Daily BMET  -Cr 1.2 today, will continue to monitor -Elevated troponin (peak 0.15) likely due to demand ischemia -Cath Monday   Type 2 Diabetes with long term insulin use: On Novolin at home, 25 units QAM and 15 units QPM. Have ordered Novolog 15 units BID with meals during admission and so far glucose has been adequately controlled. -Continue CBG checks -Continue Novolog 15 units BID  HTN: Controlled currently. Continuing Coreg 3.125 mg BID.   Dispo: Anticipated discharge pending diuresis.   Everitt Wenner, DO 05/06/2016, 9:56 AM Pager: [phone]

## 2016-05-06 NOTE — Progress Notes (Addendum)
Advanced Heart Failure Rounding Note   Subjective:    Weight down 3 pounds. Creatinine improved.   Feels much better. No further orthopnea. Able to ambulate room without problem.    Objective:   Weight Range:  Vital Signs:   Temp:  [97.5 F (36.4 C)-98 F (36.7 C)] 97.9 F (36.6 C) (03/17 0505) Pulse Rate:  [63-83] 80 (03/17 1044) Resp:  [16-21] 16 (03/17 0505) BP: (99-128)/(47-75) 109/47 (03/17 0505) SpO2:  [97 %-100 %] 99 % (03/17 0505) Weight:  [75.1 kg (165 lb 9.6 oz)-75.3 kg (166 lb 1 oz)] 75.1 kg (165 lb 9.6 oz) (03/17 0505)    Weight change: Filed Weights   05/05/16 0738 05/05/16 1421 05/06/16 0505  Weight: 76.2 kg (168 lb) 75.3 kg (166 lb 1 oz) 75.1 kg (165 lb 9.6 oz)    Intake/Output:   Intake/Output Summary (Last 24 hours) at 05/06/16 1209 Last data filed at 05/06/16 1112  Gross per 24 hour  Intake              463 ml  Output             2300 ml  Net            -1837 ml     Physical Exam: General:  Well appearing. No resp difficulty HEENT: normal Neck: supple. JVP 5. Carotids 2+ bilat; no bruits. No lymphadenopathy or thryomegaly appreciated. Cor: PMI laterally displaced. Regular rate & rhythm. No rubs, gallops or murmurs. Lungs: clear Abdomen: soft, nontender, nondistended. No hepatosplenomegaly. No bruits or masses. Good bowel sounds. Extremities: no cyanosis, clubbing, rash, edema Neuro: alert & orientedx3, cranial nerves grossly intact. moves all 4 extremities w/o difficulty. Affect pleasant  Telemetry: NSR 80s. Occasional PVC. Personally reviewed   Labs: Basic Metabolic Panel:  Recent Labs Lab 05/05/16 0720 05/06/16 0448  NA 139 139  K 4.4 3.8  CL 103 105  CO2 23 30  GLUCOSE 306* 127*  BUN 25* 28*  CREATININE 1.42* 1.22*  CALCIUM 9.0 9.0    Liver Function Tests:  Recent Labs Lab 05/05/16 0720 05/06/16 0448  AST 71* 38  ALT 80* 59*  ALKPHOS 107 85  BILITOT 0.5 0.5  PROT 6.3* 5.6*  ALBUMIN 3.6 3.1*   No results for  input(s): LIPASE, AMYLASE in the last 168 hours. No results for input(s): AMMONIA in the last 168 hours.  CBC:  Recent Labs Lab 05/05/16 0720 05/06/16 0448  WBC 10.6* 7.1  HGB 12.1 10.7*  HCT 36.4 32.0*  MCV 94.5 93.3  PLT 232 188    Cardiac Enzymes:  Recent Labs Lab 05/05/16 1222 05/05/16 1742 05/05/16 2257  TROPONINI 0.08* 0.15* 0.11*    BNP: BNP (last 3 results)  Recent Labs  12/17/15 0318 03/17/16 0119 05/05/16 0720  BNP 657.7* 362.7* 866.9*    ProBNP (last 3 results) No results for input(s): PROBNP in the last 8760 hours.    Other results:  Imaging: Ct Angio Chest Pe W Or Wo Contrast  Result Date: 05/05/2016 CLINICAL DATA:  Respiratory distress. EXAM: CT ANGIOGRAPHY CHEST WITH CONTRAST TECHNIQUE: Multidetector CT imaging of the chest was performed using the standard protocol during bolus administration of intravenous contrast. Multiplanar CT image reconstructions and MIPs were obtained to evaluate the vascular anatomy. CONTRAST:  80 cc Isovue 370 COMPARISON:  Chest radiography same day.  CT 07/05/2008. FINDINGS: Cardiovascular: Pulmonary arterial opacification is good. There are no pulmonary emboli. There is aortic atherosclerosis but no sign of aneurysm. Contrast is  not yet reached the systemic circulation. Coronary artery calcification is noted. No pericardial fluid. Mediastinum/Nodes: No mass or lymphadenopathy. Lungs/Pleura: Small bilateral pleural effusions layering dependently. Interstitial edema pattern in the lower lungs. No consolidation or collapse. Mild peripheral scarring on the left, probably related to previous radiation. Upper Abdomen: Negative Musculoskeletal: Chronic degenerative thoracic spondylosis. Review of the MIP images confirms the above findings. IMPRESSION: No pulmonary emboli. Mild interstitial edema pattern.  Small pleural effusions. Peripheral pulmonary scarring on the left related to previous chest radiation. Electronically Signed    By: Nelson Chimes M.D.   On: 05/05/2016 09:10   Dg Chest Portable 1 View  Result Date: 05/05/2016 CLINICAL DATA:  Shortness of breath. EXAM: PORTABLE CHEST 1 VIEW COMPARISON:  03/16/2016. FINDINGS: Cardiomegaly with pulmonary vascular prominence and bilateral interstitial prominence consistent with CHF. No pleural effusion or pneumothorax. No acute bony abnormality . IMPRESSION: Cardiomegaly with pulmonary vascular prominence and bilateral interstitial prominence consistent with CHF. Electronically Signed   By: Marcello Moores  Register   On: 05/05/2016 07:44      Medications:     Scheduled Medications: . aspirin EC  81 mg Oral Daily  . atorvastatin  40 mg Oral Daily  . digoxin  0.125 mg Oral Daily  . enoxaparin (LOVENOX) injection  30 mg Subcutaneous Q24H  . furosemide  80 mg Intravenous BID  . insulin aspart  0-15 Units Subcutaneous TID WC  . insulin aspart protamine- aspart  15 Units Subcutaneous BID WC  . pneumococcal 23 valent vaccine  0.5 mL Intramuscular Tomorrow-1000  . sodium chloride flush  3 mL Intravenous Q12H  . spironolactone  12.5 mg Oral QHS     Infusions:   PRN Medications:  sodium chloride, acetaminophen, ondansetron (ZOFRAN) IV, sodium chloride flush   Assessment:    1. A/C systolic CHF - LVEF 83%, Grade 2 DD. Mild MR, Mild LAE, RV mildly reduced, PA peak pressure 21 mm Hg.     --cardiac cath 10/17 minimal CAD 2. HTN 3. Poorly controlled DM2 4. Acute hypoxic respiratory failure 5. AKI  6. Frequent PVCs   Plan/Discussion:     Volume status and respiratory status much improved with IV diuresis. Renal function stable. Switch lasix to po.   Troponin mildly elevated but likely due to HF as cath last year was ok.   Will continue to titrate HF meds.   Plan RHC Monday to objectively assess volume status and output. With EF down to 20% will need to watch closely for low output.  Will need to quntify PVC burden   CR to see.   Length of Stay:  0   Glori Bickers MD 05/06/2016, 12:09 PM  Advanced Heart Failure Team Pager [phone] (M-F; 7a - 4p)  Please contact [facility] for night-coverage after hours (4p -7a ) and weekends on [email or web address]

## 2016-05-06 NOTE — Progress Notes (Signed)
Pt slept well during the night, Vitals stable, no any sign of SOB and distress noted, no any complain of pain, will continue to monitor the patient.  Palma Holter, RN

## 2016-05-07 LAB — BASIC METABOLIC PANEL WITH GFR
Anion gap: 6 (ref 5–15)
BUN: 27 mg/dL — ABNORMAL HIGH (ref 6–20)
CO2: 29 mmol/L (ref 22–32)
Calcium: 9 mg/dL (ref 8.9–10.3)
Chloride: 101 mmol/L (ref 101–111)
Creatinine, Ser: 1.11 mg/dL — ABNORMAL HIGH (ref 0.44–1.00)
GFR calc Af Amer: 56 mL/min — ABNORMAL LOW (ref >60)
GFR calc non Af Amer: 48 mL/min — ABNORMAL LOW (ref >60)
Glucose, Bld: 134 mg/dL — ABNORMAL HIGH (ref 65–99)
Potassium: 4 mmol/L (ref 3.5–5.1)
Sodium: 136 mmol/L (ref 135–145)

## 2016-05-07 LAB — GLUCOSE, CAPILLARY
Glucose-Capillary: 151 mg/dL — ABNORMAL HIGH (ref 65–99)
Glucose-Capillary: 209 mg/dL — ABNORMAL HIGH (ref 65–99)
Glucose-Capillary: 135 mg/dL — ABNORMAL HIGH (ref 65–99)
Glucose-Capillary: 211 mg/dL — ABNORMAL HIGH (ref 65–99)

## 2016-05-07 MED ORDER — LOSARTAN POTASSIUM 25 MG PO TABS
25.0000 mg | ORAL_TABLET | Freq: Two times a day (BID) | ORAL | Status: DC
  Administered 2016-05-07 – 2016-05-09 (×3): 25 mg via ORAL
  Filled 2016-05-07 (×3): qty 1

## 2016-05-07 NOTE — Progress Notes (Signed)
Advanced Heart Failure Rounding Note   Subjective:    Continues to diurese on po diuretics. Weight down another pound. (3 pounds total).  Creatinine improved to 1.11.   Feeling much better. No further orthopnea or PND. Ambulating without difficulty. No dizziness. SBP 120s    Objective:   Weight Range:  Vital Signs:   Temp:  [98.1 F (36.7 C)-98.3 F (36.8 C)] 98.3 F (36.8 C) (03/18 0048) Pulse Rate:  [65-80] 80 (03/18 0048) Resp:  [16-18] 16 (03/18 0048) BP: (121-124)/(47-82) 123/64 (03/18 0048) SpO2:  [100 %] 100 % (03/18 0048) Last BM Date: 05/06/16  Weight change: Filed Weights   05/05/16 0738 05/05/16 1421 05/06/16 0505  Weight: 168 lb (76.2 kg) 166 lb 1 oz (75.3 kg) 165 lb 9.6 oz (75.1 kg)    Intake/Output:   Intake/Output Summary (Last 24 hours) at 05/07/16 1143 Last data filed at 05/07/16 0049  Gross per 24 hour  Intake              480 ml  Output             1600 ml  Net            -1120 ml     Physical Exam: General:  Well appearing. No resp difficulty  Sitting in bed HEENT: normal Neck: supple. JVP 5. Carotids 2+ bilat; no bruits. No lymphadenopathy or thryomegaly appreciated. Cor: PMI laterally displaced. Regular rate & rhythm. No s3. No murmur Lungs: clear. No wheeze Abdomen: soft, nontender, no distension. No hepatosplenomegaly. No bruits or masses. Good bowel sounds. Extremities: no cyanosis, clubbing, rash, edema. warm Neuro: alert & orientedx3, cranial nerves grossly intact. moves all 4 extremities w/o difficulty. Affect pleasant  Telemetry: NSR 70-80s. Occasional PVCs. Personally reviewed   Labs: Basic Metabolic Panel:  Recent Labs Lab 05/05/16 0720 05/06/16 0448 05/07/16 0446  NA 139 139 136  K 4.4 3.8 4.0  CL 103 105 101  CO2 23 30 29   GLUCOSE 306* 127* 134*  BUN 25* 28* 27*  CREATININE 1.42* 1.22* 1.11*  CALCIUM 9.0 9.0 9.0    Liver Function Tests:  Recent Labs Lab 05/05/16 0720 05/06/16 0448  AST 71* 38  ALT 80*  59*  ALKPHOS 107 85  BILITOT 0.5 0.5  PROT 6.3* 5.6*  ALBUMIN 3.6 3.1*   No results for input(s): LIPASE, AMYLASE in the last 168 hours. No results for input(s): AMMONIA in the last 168 hours.  CBC:  Recent Labs Lab 05/05/16 0720 05/06/16 0448  WBC 10.6* 7.1  HGB 12.1 10.7*  HCT 36.4 32.0*  MCV 94.5 93.3  PLT 232 188    Cardiac Enzymes:  Recent Labs Lab 05/05/16 1222 05/05/16 1742 05/05/16 2257  TROPONINI 0.08* 0.15* 0.11*    BNP: BNP (last 3 results)  Recent Labs  12/17/15 0318 03/17/16 0119 05/05/16 0720  BNP 657.7* 362.7* 866.9*    ProBNP (last 3 results) No results for input(s): PROBNP in the last 8760 hours.    Other results:  Imaging: No results found.   Medications:     Scheduled Medications: . aspirin EC  81 mg Oral Daily  . atorvastatin  40 mg Oral Daily  . digoxin  0.125 mg Oral Daily  . enoxaparin (LOVENOX) injection  40 mg Subcutaneous Q24H  . furosemide  40 mg Oral BID  . insulin aspart  0-15 Units Subcutaneous TID WC  . insulin aspart protamine- aspart  15 Units Subcutaneous BID WC  . losartan  12.5  mg Oral BID  . pneumococcal 23 valent vaccine  0.5 mL Intramuscular Tomorrow-1000  . sodium chloride flush  3 mL Intravenous Q12H  . spironolactone  25 mg Oral QHS    Infusions:   PRN Medications: sodium chloride, acetaminophen, ondansetron (ZOFRAN) IV, sodium chloride flush   Assessment:    1. A/C systolic CHF - LVEF 85%, Grade 2 DD. Mild MR, Mild LAE, RV mildly reduced, PA peak pressure 21 mm Hg.     --cardiac cath 10/17 minimal CAD 2. HTN 3. Poorly controlled DM2 4. Acute hypoxic respiratory failure 5. AKI  6. Frequent PVCs   Plan/Discussion:     Volume status and respiratory status much improved with IV diuresis. Renal function improved as well. Continue po lasix, digoxin and spiro. Will titrate losartan to 25 bid. If BP tolerates will switch to Entresto in am   Troponin mildly elevated but likely due to HF  as cath last year was ok.   Plan RHC tomorrow am to objectively assess volume status and output. We discussed the procedure and orders written.  Will need to quntify PVC burden   CR to see.   Length of Stay: 1   Bensimhon, Daniel MD 05/07/2016, 11:43 AM  Advanced Heart Failure Team Pager [phone] (M-F; [place])  Please contact [facility] for night-coverage after hours (4p -7a ) and weekends on [email or web address]

## 2016-05-07 NOTE — Progress Notes (Signed)
   Subjective: [name] feels better today, seen resting lying almost flat in bed without any dyspnea. She is having a good urine output with diuretics.  Objective:  Vital signs in last 24 hours: Vitals:   05/06/16 1044 05/06/16 1244 05/06/16 2150 05/07/16 0048  BP:  124/82 (!) 121/47 123/64  Pulse: 80 65 77 80  Resp:  18 17 16   Temp:  98.2 F (36.8 C) 98.1 F (36.7 C) 98.3 F (36.8 C)  TempSrc:  Oral Oral Oral  SpO2:  100% 100% 100%  Weight:      Height:       General: Resting comfortable in no acute distress HENT: Mucous membranes moist.  Cardiovascular: Regular rate and rhythm. No murmur or rub appreciated. No obvious peripheral edema Pulmonary: Faint bibasilar crackles. Normal WOB  Skin: Warm, dry. Psych: Mood is normal with pleasant demeanor  Assessment/Plan:  Acute on Chronic Combined Systolic and Diastolic CHF: ECHO LVEF at 20% with grade 2 diastolic dysfunction. Down from 40-45% in 11/2015. Her oral intake has been too high which will need restriction at this time. We are reducing diuresis today since she is clinically not severely hypervolemic. She reports her dry weight is ~ 155 however chart review shows variable weights. She is tolerating diuresis well with decreasing creatinine. -Continue diuresis, HF team recommendations appreciated -Daily BMET  -Cr 1.1 today, will continue to monitor -Right heart cath Monday, NPO @[place]   Type 2 Diabetes with long term insulin use: On Novolin at home, 25 units QAM and 15 units QPM. Have ordered Novolog 15 units BID with meals during admission and so far glucose has been adequately controlled. -Continue CBG checks -Continue Novolog 15 units BID  HTN: Controlled currently. -Continuing Coreg 3.125 mg BID.   Dispo: Initial dyspnea is clinically improved, with plan for Right heart catheterization tomorrow   Collier Salina, MD PGY-II Internal Medicine Resident Pager# [phone] 05/07/2016, 9:12 AM

## 2016-05-07 NOTE — Progress Notes (Signed)
Pt slept well during the night, Vitals stable, no any sign of SOB and distress noted, no any complain of pain, will continue to monitor the patient. 

## 2016-05-08 ENCOUNTER — Inpatient Hospital Stay (HOSPITAL_COMMUNITY)
Admission: EM | Disposition: A | Discharge status: Home / Self Care | Payer: Self-pay | Source: Home / Self Care | Attending: Infectious Disease

## 2016-05-08 ENCOUNTER — Encounter (HOSPITAL_COMMUNITY): Payer: Self-pay | Admitting: Internal Medicine

## 2016-05-08 DIAGNOSIS — Z794 Long term (current) use of insulin: Secondary | ICD-10-CM

## 2016-05-08 DIAGNOSIS — I428 Other cardiomyopathies: Secondary | ICD-10-CM

## 2016-05-08 DIAGNOSIS — I2510 Atherosclerotic heart disease of native coronary artery without angina pectoris: Secondary | ICD-10-CM

## 2016-05-08 DIAGNOSIS — E119 Type 2 diabetes mellitus without complications: Secondary | ICD-10-CM

## 2016-05-08 DIAGNOSIS — Z91048 Other nonmedicinal substance allergy status: Secondary | ICD-10-CM

## 2016-05-08 DIAGNOSIS — I110 Hypertensive heart disease with heart failure: Secondary | ICD-10-CM

## 2016-05-08 HISTORY — PX: RIGHT HEART CATH: CATH118263

## 2016-05-08 LAB — GLUCOSE, CAPILLARY
GLUCOSE-CAPILLARY: 208 mg/dL — AB (ref 65–99)
GLUCOSE-CAPILLARY: 300 mg/dL — AB (ref 65–99)
GLUCOSE-CAPILLARY: 163 mg/dL — AB (ref 65–99)
Glucose-Capillary: 224 mg/dL — ABNORMAL HIGH (ref 65–99)

## 2016-05-08 LAB — CBC WITH DIFFERENTIAL/PLATELET
BASOS ABS: 0 10*3/uL (ref 0.0–0.1)
BASOS PCT: 0 %
Eosinophils Absolute: 0 10*3/uL (ref 0.0–0.7)
Eosinophils Relative: 0 %
HEMATOCRIT: 36.3 % (ref 36.0–46.0)
HEMOGLOBIN: 12.5 g/dL (ref 12.0–15.0)
Lymphocytes Relative: 12 %
Lymphs Abs: 1.6 10*3/uL (ref 0.7–4.0)
MCH: 31.2 pg (ref 26.0–34.0)
MCHC: 34.4 g/dL (ref 30.0–36.0)
MCV: 90.5 fL (ref 78.0–100.0)
Monocytes Absolute: 1.4 10*3/uL — ABNORMAL HIGH (ref 0.1–1.0)
Monocytes Relative: 10 %
NEUTROS PCT: 78 %
Neutro Abs: 10.5 10*3/uL — ABNORMAL HIGH (ref 1.7–7.7)
Platelets: 194 10*3/uL (ref 150–400)
RBC: 4.01 MIL/uL (ref 3.87–5.11)
RDW: 13.2 % (ref 11.5–15.5)
WBC: 13.5 10*3/uL — ABNORMAL HIGH (ref 4.0–10.5)

## 2016-05-08 LAB — POCT I-STAT 3, VENOUS BLOOD GAS (G3P V)
Acid-Base Excess: 3 mmol/L — ABNORMAL HIGH (ref 0.0–2.0)
Acid-Base Excess: 1 mmol/L (ref 0.0–2.0)
Bicarbonate: 27.6 mmol/L (ref 20.0–28.0)
Bicarbonate: 25.3 mmol/L (ref 20.0–28.0)
O2 SAT: 59 %
O2 Saturation: 62 %
PCO2 VEN: 42.2 mmHg — AB (ref 44.0–60.0)
TCO2: 26 mmol/L (ref 0–100)
TCO2: 29 mmol/L (ref 0–100)
pCO2, Ven: 39.9 mmHg — ABNORMAL LOW (ref 44.0–60.0)
pH, Ven: 7.424 (ref 7.250–7.430)
pH, Ven: 7.41 (ref 7.250–7.430)
pO2, Ven: 32 mmHg (ref 32.0–45.0)
pO2, Ven: 30 mmHg — CL (ref 32.0–45.0)

## 2016-05-08 LAB — BASIC METABOLIC PANEL
Anion gap: 12 (ref 5–15)
BUN: 25 mg/dL — AB (ref 6–20)
CALCIUM: 9.2 mg/dL (ref 8.9–10.3)
CO2: 21 mmol/L — ABNORMAL LOW (ref 22–32)
Chloride: 100 mmol/L — ABNORMAL LOW (ref 101–111)
Creatinine, Ser: 1.1 mg/dL — ABNORMAL HIGH (ref 0.44–1.00)
GFR calc Af Amer: 57 mL/min — ABNORMAL LOW (ref >60)
GFR, EST NON AFRICAN AMERICAN: 49 mL/min — AB (ref >60)
GLUCOSE: 160 mg/dL — AB (ref 65–99)
POTASSIUM: 4.5 mmol/L (ref 3.5–5.1)
SODIUM: 133 mmol/L — AB (ref 135–145)

## 2016-05-08 LAB — INFLUENZA PANEL BY PCR (TYPE A & B)
Influenza A By PCR: NEGATIVE
Influenza B By PCR: NEGATIVE

## 2016-05-08 LAB — PROTIME-INR
INR: 1.2
Prothrombin Time: 15.3 seconds — ABNORMAL HIGH (ref 11.4–15.2)

## 2016-05-08 SURGERY — RIGHT HEART CATH
Anesthesia: LOCAL

## 2016-05-08 MED ORDER — LIDOCAINE HCL (PF) 1 % IJ SOLN
INTRAMUSCULAR | Status: DC | PRN
  Administered 2016-05-08: 1 mL via INTRADERMAL

## 2016-05-08 MED ORDER — SODIUM CHLORIDE 0.9% FLUSH
3.0000 mL | Freq: Two times a day (BID) | INTRAVENOUS | Status: DC
  Administered 2016-05-08 – 2016-05-10 (×4): 3 mL via INTRAVENOUS

## 2016-05-08 MED ORDER — SODIUM CHLORIDE 0.9 % IV SOLN
INTRAVENOUS | Status: DC
  Administered 2016-05-08: 08:00:00 via INTRAVENOUS

## 2016-05-08 MED ORDER — SODIUM CHLORIDE 0.9% FLUSH: 3.0000 mL | Freq: Two times a day (BID) | INTRAVENOUS | Status: DC

## 2016-05-08 MED ORDER — MIDAZOLAM HCL 2 MG/2ML IJ SOLN
INTRAMUSCULAR | Status: DC | PRN
  Administered 2016-05-08: 1 mg via INTRAVENOUS

## 2016-05-08 MED ORDER — DIGOXIN 125 MCG PO TABS: 0.1250 mg | ORAL_TABLET | Freq: Every day | ORAL | 0 refills | Status: DC

## 2016-05-08 MED ORDER — ASPIRIN 81 MG PO CHEW
81.0000 mg | CHEWABLE_TABLET | ORAL | Status: AC
  Administered 2016-05-08: 81 mg via ORAL
  Filled 2016-05-08: qty 1

## 2016-05-08 MED ORDER — HEPARIN (PORCINE) IN NACL 2-0.9 UNIT/ML-% IJ SOLN: INTRAMUSCULAR | Status: DC | PRN

## 2016-05-08 MED ORDER — SODIUM CHLORIDE 0.9 % IV SOLN: 250.0000 mL | INTRAVENOUS | Status: DC | PRN

## 2016-05-08 MED ORDER — ACETAMINOPHEN 325 MG PO TABS
650.0000 mg | ORAL_TABLET | ORAL | Status: DC | PRN
  Administered 2016-05-08 – 2016-05-09 (×3): 650 mg via ORAL
  Filled 2016-05-08 (×3): qty 2

## 2016-05-08 MED ORDER — SACUBITRIL-VALSARTAN 24-26 MG PO TABS: 1.0000 | ORAL_TABLET | Freq: Two times a day (BID) | ORAL | 0 refills | Status: DC

## 2016-05-08 MED ORDER — SODIUM CHLORIDE 0.9 % IV SOLN
INTRAVENOUS | Status: DC
  Administered 2016-05-08: 1000 mL via INTRAVENOUS

## 2016-05-08 MED ORDER — SPIRONOLACTONE 25 MG PO TABS: 12.5000 mg | ORAL_TABLET | Freq: Every day | ORAL | 0 refills | Status: DC

## 2016-05-08 MED ORDER — PROMETHAZINE HCL 25 MG/ML IJ SOLN
12.5000 mg | Freq: Four times a day (QID) | INTRAMUSCULAR | Status: DC | PRN
  Administered 2016-05-08: 12.5 mg via INTRAVENOUS
  Filled 2016-05-08: qty 1

## 2016-05-08 MED ORDER — SODIUM CHLORIDE 0.9% FLUSH: 3.0000 mL | INTRAVENOUS | Status: DC | PRN

## 2016-05-08 MED ORDER — ONDANSETRON 4 MG PO TBDP
16.0000 mg | ORAL_TABLET | Freq: Once | ORAL | Status: DC
  Filled 2016-05-08: qty 4

## 2016-05-08 MED ORDER — HEPARIN (PORCINE) IN NACL 2-0.9 UNIT/ML-% IJ SOLN
INTRAMUSCULAR | Status: AC
  Filled 2016-05-08: qty 500

## 2016-05-08 MED ORDER — FENTANYL CITRATE (PF) 100 MCG/2ML IJ SOLN
INTRAMUSCULAR | Status: AC
  Filled 2016-05-08: qty 2

## 2016-05-08 MED ORDER — ONDANSETRON HCL 4 MG/2ML IJ SOLN: 4.0000 mg | Freq: Four times a day (QID) | INTRAMUSCULAR | Status: DC | PRN

## 2016-05-08 MED ORDER — MIDAZOLAM HCL 2 MG/2ML IJ SOLN
INTRAMUSCULAR | Status: AC
  Filled 2016-05-08: qty 2

## 2016-05-08 MED ORDER — FENTANYL CITRATE (PF) 100 MCG/2ML IJ SOLN
INTRAMUSCULAR | Status: DC | PRN
  Administered 2016-05-08: 25 ug via INTRAVENOUS

## 2016-05-08 MED ORDER — LIDOCAINE HCL (PF) 1 % IJ SOLN
INTRAMUSCULAR | Status: AC
  Filled 2016-05-08: qty 30

## 2016-05-08 SURGICAL SUPPLY — 6 items
CATH BALLN WEDGE 5F 110CM (CATHETERS) ×1 IMPLANT
PACK CARDIAC CATHETERIZATION (CUSTOM PROCEDURE TRAY) ×2 IMPLANT
SHEATH FAST CATH BRACH 5F 5CM (SHEATH) ×1 IMPLANT
TRANSDUCER W/STOPCOCK (MISCELLANEOUS) ×2 IMPLANT
TUBING ART PRESS 72  MALE/FEM (TUBING) ×1
TUBING ART PRESS 72 MALE/FEM (TUBING) IMPLANT

## 2016-05-08 NOTE — Progress Notes (Signed)
   Subjective: [name] was seen and evaluated today at bedside. She complains of some nausea this morning however is otherwise without complaint. Reports dyspnea has resolved and is back to baseline.   Objective:  Vital signs in last 24 hours: Vitals:   05/08/16 0931 05/08/16 0936 05/08/16 0941 05/08/16 1025  BP: (!) 104/59     Pulse: 62 (!) 0 (!) 0   Resp: (!) 21 (!) 35 (!) 0   Temp:    98.3 F (36.8 C)  TempSrc:    Oral  SpO2: 99% (!) 0% (!) 0%   Weight:      Height:       General: Very pleasant african [place] female initially sleeping comfortably on left side. In no acute distress upon awakening during interview. HENT: Mucous membranes moist. No ptosis.  Cardiovascular: Regular rate and rhythm. No murmur or rub appreciated. No obvious peripheral edema Pulmonary: CTA BL. Normal WOB  Skin: Warm, dry. Well perfused.  Psych: Mood is normal with pleasant demeanor.  RHC results: Findings: RA = 3 RV = 34/5 PA = 38/12 (21) PCW = 12 Fick cardiac output/index = 4.6/2.6 PVR = 1.9 WU Ao sat = 96% PA sat = 58%, 62%  Assessment/Plan:  Acute on Chronic Combined Systolic and Diastolic CHF:  Weight today 162 lbs, weight on admission 168. ECHO LVEF at 20% with grade 2 diastolic dysfunction. Down from 40-45% in 11/2015. Had [place] today which showed well compensated filling pressures and cardiac output, per HF team and feel patient is ready for discharge. They have made recommendations for her HF medications upon discharge and have arranged follow-up for the patient in the HF clinic next week.  -Renal function stable at 1.1 -Entresto 24/26 BID -Dig 0.125 mg daily -Spironolactone 12.5 mg daily -Lasix 40 daily -ASA 81 mg daily -Atorvastatin 40 mg daily -holding carvedilol for now  Type 2 Diabetes with long term insulin use: On Novolin at home, 25 units QAM and 15 units QPM. -Continue CBG checks -Continue Novolog 15 units BID while hospitalized, pt to resume usual dosing upon  d/c  HTN: Controlled currently. -Holding Coreg.  Dispo: Discharge home today with close follow-up in HF clinic.   Einar Gip, D.O.  PGY-I Internal Medicine Resident Pager# [phone] 05/08/2016, 10:38 AM

## 2016-05-08 NOTE — Progress Notes (Signed)
Dr. Reesa Chew and Dr. Tiburcio Pea at the bedside now.

## 2016-05-08 NOTE — Discharge Instructions (Signed)
It was a pleasure taking care of you, [name]. You were admitted for an exacerbation of your heart failure. For this, you were diuresed with IV lasix and your medications were optimized by the Advanced Heart Failure team. We are making changes to some of your medications so please ensure you are taking them as directed in these discharge papers. You have a follow-up appointment with the heart failure physicians 05/16/16 at 9 am.

## 2016-05-08 NOTE — Progress Notes (Signed)
Internal Medicine Attending:   I saw and examined the patient. I reviewed the resident's note and I agree with the resident's findings and plan as documented in the resident's note. Patient reports feeling well this morning she denies any shortness of breath.On Exam she appears euvolemic. She underwent right heart catheterization today with Dr. Haroldine Laws and it appears she is well compensated. She will be discharged today with close follow-up with heart failure clinic.

## 2016-05-08 NOTE — Interval H&P Note (Signed)
History and Physical Interval Note:  05/08/2016 9:14 AM  [name]  has presented today for surgery, with the diagnosis of hf  The various methods of treatment have been discussed with the patient and family. After consideration of risks, benefits and other options for treatment, the patient has consented to  Procedure(s): Right Heart Cath (N/A) as a surgical intervention .  The patient's history has been reviewed, patient examined, no change in status, stable for surgery.  I have reviewed the patient's chart and labs.  Questions were answered to the patient's satisfaction.     Dodie Parisi, Quillian Quince

## 2016-05-08 NOTE — Progress Notes (Signed)
RHC results:  Findings:  RA = 3 RV = 34/5 PA = 38/12 (21) PCW = 12 Fick cardiac output/index = 4.6/2.6 PVR = 1.9 WU Ao sat = 96% PA sat = 58%, 62%  Assessment: 1. Well compensated filling pressures and cardiac output  Plan/Discussion:  Can go home today on:  Entresto 24/26 bid Digoxin 0.125 daily Spiro 12.5 mg daily Lasix 40mg  daily Ecasa 81mg  daily Atorva 40 mg daily.   Hold carvedilol for now  F/u HF Clinic   05/16/16 at [place], Daniel,MD 9:43 AM

## 2016-05-08 NOTE — Discharge Summary (Signed)
Name: [name] MRN: [id number] DOB: 1945/07/01 71 y.o. PCP: Javier Docker, MD  Date of Admission: 05/05/2016  7:13 AM Date of Discharge: 05/08/2016 Attending Physician: Lucious Groves, DO  Discharge Diagnosis: 1. Acute on Chronic Combined Systolic and Diastolic CHF 2. HTN  Principal Problem:   Acute on chronic combined systolic and diastolic CHF (congestive heart failure) (HCC) Active Problems:   Type 2 diabetes mellitus without complication, with long-term current use of insulin (HCC)   Hypertension   Coronary artery disease involving native heart without angina pectoris   NICM (nonischemic cardiomyopathy) (HCC)   Respiratory distress   Acute CHF (congestive heart failure) ([place])   Discharge Medications: Allergies as of 05/08/2016      Reactions   Tape Rash   Pt can tolerate the paper tape      Medication List    STOP taking these medications   carvedilol 3.125 MG tablet Commonly known as:  COREG     TAKE these medications   aspirin 81 MG chewable tablet Chew 1 tablet (81 mg total) by mouth daily.   atorvastatin 40 MG tablet Commonly known as:  LIPITOR Take 1 tablet (40 mg total) by mouth daily.   digoxin 0.125 MG tablet Commonly known as:  LANOXIN Take 1 tablet (0.125 mg total) by mouth daily. Start taking on:  05/09/2016   furosemide 40 MG tablet Commonly known as:  LASIX Take 1 tablet (40 mg total) by mouth daily.   insulin NPH-regular Human (70-30) 100 UNIT/ML injection Commonly known as:  NOVOLIN 70/30 Please start using 25 units in am and 15 units at bedtime. Check CBG's three times a day (including fasting in am) What changed:  additional instructions   magnesium oxide 400 (241.3 Mg) MG tablet Commonly known as:  MAG-OX Take 1 tablet (400 mg total) by mouth 2 (two) times daily.   sacubitril-valsartan 24-26 MG Commonly known as:  ENTRESTO Take 1 tablet by mouth 2 (two) times daily.   spironolactone 25 MG tablet Commonly known as:   ALDACTONE Take 0.5 tablets (12.5 mg total) by mouth at bedtime.       Disposition and follow-up:   Ms.Xaria SAANVIKA VAZQUES was discharged from [facility] in stable condition.  At the hospital follow up visit please address:  1.  Acute on Chronic Combined HF: Ensure compliance with Lasix, Entresto, spironolactone. She will need continued counseling on dietary restrictions in patients with CHF. During admission, she admitted to drinking 2-3 gallons of fludis a day. Will need to establish dry weights.   HTN: Coreg was held on discharge. Please evaluate BP and evaluate need for restarting this medicine.   2.  Labs / imaging needed at time of follow-up: Digoxin level  3.  Pending labs/ test needing follow-up: None.  Follow-up Appointments: Follow-up Information    Glori Bickers, MD. Go to.   Specialty:  Cardiology Why:  APPOINTMENT 05/16/16 @ 9 AM.  Contact information: [address] [place] [address] [phone]        Javier Docker, MD. Schedule an appointment as soon as possible for a visit in 1 week(s).   Specialty:  Internal Medicine Contact information: [address] [place] [address] [phone]           Hospital Course by problem list: Principal Problem:   Acute on chronic combined systolic and diastolic CHF (congestive heart failure) ([place]) Active Problems:   Type 2 diabetes mellitus without  complication, with long-term current use of insulin ([place])   Hypertension   Coronary artery disease involving native heart without angina pectoris   NICM (nonischemic cardiomyopathy) ([place])   Respiratory distress   Acute CHF (congestive heart failure) ([place])   1. Acute on Chronic Combined Systolic and Diastolic CHF Ms. [name] is a 71 year old female with combined systolic and diastolic CHF who presented with progressive dyspnea, abdominal fullness and was found to be 14 lbs over dry weight. She initially required  BiPAP however quickly stabilized. She was seen by the advanced heart failure team who diuresed the patient with IV lasix 80 mg BID and started spironolactone 12.5 mg and digoxin 0.125 mg daily. Her SOB quickly improved with diuresis and she underwent RHC 05/08/16 which showed well compensated filling pressures and cardiac output. At time of discharge, she weighed 162 lbs and appeared euvolemic. She was discharged home on Lasix 40 mg as well as new medications including Entresto 24/26, Digoxin 0.125 mg and Spironolactone 12.5 mg daily. She has close follow up scheduled with the heart failure clinic 05/16/16.   2. Essential Hypertension Blood pressures remained stable during admission. Was initially started on Losartan however this was transitioned to [facility] and spironolactone on discharge. Coreg was held on discharge.   Discharge Vitals:   BP 134/61 (BP Location: Left Arm)   Pulse 88   Temp 98.8 F (37.1 C) (Oral)   Resp 18   Ht 5\' 3"  (1.6 m)   Wt 162 lb 4.8 oz (73.6 kg)   SpO2 100%   BMI 28.75 kg/m   Pertinent Labs, Studies, and Procedures:  CXR: Cardiomegaly with pulmonary vascular congestion and bilateral interstitial prominence consistent with CHF CTA chest: No PE. Mild interstitial edema.  ECHO: LVEF 20%. Diffuse hypokinesis. Grade 2 diastolic dysfunction. Mild mitral regurg. Pulmonary peak pressure 21 mmHg RHC:  RA = 3 RV = 34/5 PA = 38/12 (21) PCW = 12 Fick cardiac output/index = 4.6/2.6 PVR = 1.9 WU Ao sat = 96% PA sat = 58%, 62%  Discharge Instructions: Please limit your fluid intake to less than 2 L per day. Please weigh yourself daily and contact your physician if you gain more than 2 pounds in a day or 3 lbs over 5 days. You will need to follow up with the advanced heart failure physicians and an appointment has already been scheduled for you 05/16/16 at 9 am. Your medications were changed during admission, please ensure these changes are made on discharge. As always, call  your physician or return to ED if your symptoms were to return.  SignedEinar Gip, DO 05/08/2016, 1:54 PM   Pager: [phone]

## 2016-05-08 NOTE — H&P (View-Only) (Signed)
Advanced Heart Failure Rounding Note   Subjective:    Feels good. Ambulating room without difficulty. Denies orthopnea or PND. BP stable on increased dose losartan. No dizziness.  Creatinine stable.   Weight down to 162.   Objective:   Weight Range:  Vital Signs:   Temp:  [98 F (36.7 C)-100.2 F (37.9 C)] 100.2 F (37.9 C) (03/19 0175) Pulse Rate:  [78-89] 89 (03/19 0613) Resp:  [18-20] 18 (03/19 0613) BP: (120-129)/(54-68) 129/59 (03/19 0613) SpO2:  [98 %-100 %] 99 % (03/19 0859) Weight:  [73.6 kg (162 lb 4.8 oz)] 73.6 kg (162 lb 4.8 oz) (03/19 0613) Last BM Date: 05/06/16  Weight change: Filed Weights   05/05/16 1421 05/06/16 0505 05/08/16 0613  Weight: 75.3 kg (166 lb 1 oz) 75.1 kg (165 lb 9.6 oz) 73.6 kg (162 lb 4.8 oz)    Intake/Output:   Intake/Output Summary (Last 24 hours) at 05/08/16 0910 Last data filed at 05/08/16 1025  Gross per 24 hour  Intake              783 ml  Output             2050 ml  Net            -1267 ml     Physical Exam: General:  Well appearing. No resp difficulty  Lying in bed  HEENT: normal Neck: supple. JVP flat. Carotids 2+ bilat; no bruits. No lymphadenopathy or thryomegaly appreciated. Cor: PMI laterally displaced. Regular rate & rhythm. No s3. No murmur Lungs: clear. No wheeze on rhonchi Abdomen: soft, nontender, nondistended No hepatosplenomegaly. No bruits or masses. Good bowel sounds. Extremities: no cyanosis, clubbing, rash. Warm No edema Neuro: alert & orientedx3, cranial nerves grossly intact. moves all 4 extremities w/o difficulty. Affect pleasant  Telemetry: NSR 70-80s. Occasional PVCs. Personally reviewed   Labs: Basic Metabolic Panel:  Recent Labs Lab 05/05/16 0720 05/06/16 0448 05/07/16 0446 05/08/16 0444  NA 139 139 136 133*  K 4.4 3.8 4.0 4.5  CL 103 105 101 100*  CO2 23 30 29  21*  GLUCOSE 306* 127* 134* 160*  BUN 25* 28* 27* 25*  CREATININE 1.42* 1.22* 1.11* 1.10*  CALCIUM 9.0 9.0 9.0 9.2     Liver Function Tests:  Recent Labs Lab 05/05/16 0720 05/06/16 0448  AST 71* 38  ALT 80* 59*  ALKPHOS 107 85  BILITOT 0.5 0.5  PROT 6.3* 5.6*  ALBUMIN 3.6 3.1*   No results for input(s): LIPASE, AMYLASE in the last 168 hours. No results for input(s): AMMONIA in the last 168 hours.  CBC:  Recent Labs Lab 05/05/16 0720 05/06/16 0448  WBC 10.6* 7.1  HGB 12.1 10.7*  HCT 36.4 32.0*  MCV 94.5 93.3  PLT 232 188    Cardiac Enzymes:  Recent Labs Lab 05/05/16 1222 05/05/16 1742 05/05/16 2257  TROPONINI 0.08* 0.15* 0.11*    BNP: BNP (last 3 results)  Recent Labs  12/17/15 0318 03/17/16 0119 05/05/16 0720  BNP 657.7* 362.7* 866.9*    ProBNP (last 3 results) No results for input(s): PROBNP in the last 8760 hours.    Other results:  Imaging: No results found.   Medications:     Scheduled Medications: . [MAR Hold] aspirin EC  81 mg Oral Daily  . [MAR Hold] atorvastatin  40 mg Oral Daily  . [MAR Hold] digoxin  0.125 mg Oral Daily  . [MAR Hold] enoxaparin (LOVENOX) injection  40 mg Subcutaneous Q24H  . [facility] Hold]  furosemide  40 mg Oral BID  . [MAR Hold] insulin aspart  0-15 Units Subcutaneous TID WC  . [MAR Hold] insulin aspart protamine- aspart  15 Units Subcutaneous BID WC  . [MAR Hold] losartan  25 mg Oral BID  . [MAR Hold] sodium chloride flush  3 mL Intravenous Q12H  . sodium chloride flush  3 mL Intravenous Q12H  . [MAR Hold] spironolactone  25 mg Oral QHS    Infusions: . sodium chloride 10 mL/hr at 05/08/16 0800  . heparin      PRN Medications: [MAR Hold] sodium chloride, sodium chloride, [MAR Hold] acetaminophen, heparin, midazolam, [MAR Hold] ondansetron (ZOFRAN) IV, [MAR Hold] sodium chloride flush, sodium chloride flush   Assessment:    1. A/C systolic CHF - LVEF 32%, Grade 2 DD. Mild MR, Mild LAE, RV mildly reduced, PA peak pressure 21 mm Hg.     --cardiac cath 10/17 minimal CAD 2. HTN 3. Poorly controlled DM2 4. Acute  hypoxic respiratory failure 5. AKI  6. Frequent PVCs   Plan/Discussion:     Volume status and respiratory status much improved this am.   Renal function stable. Continue po lasix, digoxin and spiro. Increased osartan to 25 bid yesterday. Will switch to entresto 24/26 on d/c  Troponin mildly elevated but likely due to HF as cath last year was ok.   Plan RHC this am.  We discussed procedure.   Will need to assess PVC burden   CR to see.   Likely home today after cath.   Length of Stay: 2   Glori Bickers MD 05/08/2016, 9:10 AM  Advanced Heart Failure Team Pager [phone] (M-F; [place])  Please contact [facility] for night-coverage after hours (4p -7a ) and weekends on [email or web address]

## 2016-05-08 NOTE — Progress Notes (Signed)
Advanced Heart Failure Rounding Note   Subjective:    Feels good. Ambulating room without difficulty. Denies orthopnea or PND. BP stable on increased dose losartan. No dizziness.  Creatinine stable.   Weight down to 162.   Objective:   Weight Range:  Vital Signs:   Temp:  [98 F (36.7 C)-100.2 F (37.9 C)] 100.2 F (37.9 C) (03/19 2725) Pulse Rate:  [78-89] 89 (03/19 0613) Resp:  [18-20] 18 (03/19 0613) BP: (120-129)/(54-68) 129/59 (03/19 0613) SpO2:  [98 %-100 %] 99 % (03/19 0859) Weight:  [73.6 kg (162 lb 4.8 oz)] 73.6 kg (162 lb 4.8 oz) (03/19 0613) Last BM Date: 05/06/16  Weight change: Filed Weights   05/05/16 1421 05/06/16 0505 05/08/16 0613  Weight: 75.3 kg (166 lb 1 oz) 75.1 kg (165 lb 9.6 oz) 73.6 kg (162 lb 4.8 oz)    Intake/Output:   Intake/Output Summary (Last 24 hours) at 05/08/16 0910 Last data filed at 05/08/16 3664  Gross per 24 hour  Intake              783 ml  Output             2050 ml  Net            -1267 ml     Physical Exam: General:  Well appearing. No resp difficulty  Lying in bed  HEENT: normal Neck: supple. JVP flat. Carotids 2+ bilat; no bruits. No lymphadenopathy or thryomegaly appreciated. Cor: PMI laterally displaced. Regular rate & rhythm. No s3. No murmur Lungs: clear. No wheeze on rhonchi Abdomen: soft, nontender, nondistended No hepatosplenomegaly. No bruits or masses. Good bowel sounds. Extremities: no cyanosis, clubbing, rash. Warm No edema Neuro: alert & orientedx3, cranial nerves grossly intact. moves all 4 extremities w/o difficulty. Affect pleasant  Telemetry: NSR 70-80s. Occasional PVCs. Personally reviewed   Labs: Basic Metabolic Panel:  Recent Labs Lab 05/05/16 0720 05/06/16 0448 05/07/16 0446 05/08/16 0444  NA 139 139 136 133*  K 4.4 3.8 4.0 4.5  CL 103 105 101 100*  CO2 23 30 29  21*  GLUCOSE 306* 127* 134* 160*  BUN 25* 28* 27* 25*  CREATININE 1.42* 1.22* 1.11* 1.10*  CALCIUM 9.0 9.0 9.0 9.2     Liver Function Tests:  Recent Labs Lab 05/05/16 0720 05/06/16 0448  AST 71* 38  ALT 80* 59*  ALKPHOS 107 85  BILITOT 0.5 0.5  PROT 6.3* 5.6*  ALBUMIN 3.6 3.1*   No results for input(s): LIPASE, AMYLASE in the last 168 hours. No results for input(s): AMMONIA in the last 168 hours.  CBC:  Recent Labs Lab 05/05/16 0720 05/06/16 0448  WBC 10.6* 7.1  HGB 12.1 10.7*  HCT 36.4 32.0*  MCV 94.5 93.3  PLT 232 188    Cardiac Enzymes:  Recent Labs Lab 05/05/16 1222 05/05/16 1742 05/05/16 2257  TROPONINI 0.08* 0.15* 0.11*    BNP: BNP (last 3 results)  Recent Labs  12/17/15 0318 03/17/16 0119 05/05/16 0720  BNP 657.7* 362.7* 866.9*    ProBNP (last 3 results) No results for input(s): PROBNP in the last 8760 hours.    Other results:  Imaging: No results found.   Medications:     Scheduled Medications: . [MAR Hold] aspirin EC  81 mg Oral Daily  . [MAR Hold] atorvastatin  40 mg Oral Daily  . [MAR Hold] digoxin  0.125 mg Oral Daily  . [MAR Hold] enoxaparin (LOVENOX) injection  40 mg Subcutaneous Q24H  . [facility] Hold]  furosemide  40 mg Oral BID  . [MAR Hold] insulin aspart  0-15 Units Subcutaneous TID WC  . [MAR Hold] insulin aspart protamine- aspart  15 Units Subcutaneous BID WC  . [MAR Hold] losartan  25 mg Oral BID  . [MAR Hold] sodium chloride flush  3 mL Intravenous Q12H  . sodium chloride flush  3 mL Intravenous Q12H  . [MAR Hold] spironolactone  25 mg Oral QHS    Infusions: . sodium chloride 10 mL/hr at 05/08/16 0800  . heparin      PRN Medications: [MAR Hold] sodium chloride, sodium chloride, [MAR Hold] acetaminophen, heparin, midazolam, [MAR Hold] ondansetron (ZOFRAN) IV, [MAR Hold] sodium chloride flush, sodium chloride flush   Assessment:    1. A/C systolic CHF - LVEF 25%, Grade 2 DD. Mild MR, Mild LAE, RV mildly reduced, PA peak pressure 21 mm Hg.     --cardiac cath 10/17 minimal CAD 2. HTN 3. Poorly controlled DM2 4. Acute  hypoxic respiratory failure 5. AKI  6. Frequent PVCs   Plan/Discussion:     Volume status and respiratory status much improved this am.   Renal function stable. Continue po lasix, digoxin and spiro. Increased osartan to 25 bid yesterday. Will switch to entresto 24/26 on d/c  Troponin mildly elevated but likely due to HF as cath last year was ok.   Plan RHC this am.  We discussed procedure.   Will need to assess PVC burden   CR to see.   Likely home today after cath.   Length of Stay: 2   Glori Bickers MD 05/08/2016, 9:10 AM  Advanced Heart Failure Team Pager [phone] (M-F; [place])  Please contact [facility] for night-coverage after hours (4p -7a ) and weekends on [email or web address]

## 2016-05-08 NOTE — Progress Notes (Signed)
Pt can not keep anything down and is vomiting.  MD paged, discharge cancelled.

## 2016-05-08 NOTE — Progress Notes (Signed)
[employer] coupon card given to the patient as requested; Patient has Medicare part A; CM informed her to call the medication assistance program with [facility] for ongoing assistance. Daughter at the bedside; Aneta Mins [phone]

## 2016-05-08 NOTE — Progress Notes (Signed)
Night float interim progress note.  Patient by RN that patient is having a temperature of 102.9 around 8:00 PM  Mrs. [name] was admitted on March 16 because of acute on chronic combined heart failure, she was diuresed and feeling well, back to her dry weight today. She did also had her right heart cath today which shows well compensated filling pressures and cardiac output. She was discharged home, later in the afternoon she developed nausea and vomiting, primary team was informed, they gave her some Phenergan and discontinued discharge orders.  Her nausea and vomiting improved with Phenergan, later she developed fever of 102.9 around 8 p.m. when went up to evaluate her she was denying generalized body aches and pains, no nasal congestion or cough, denies any dysuria. She was having mild nausea and mild headache. Denies any abdominal pain or diarrhea.  She had a pneumonia and influenza vaccine yesterday.  Exam. Temperature. 102.9, BP. 134/63, pulse. 99, oxygen saturation. 96% on room air. Gen. Well-developed, well-nourished lady, in no acute distress. Chest. Clear bilaterally. Abdomen. Soft, nontender, nondistended, bowel sounds positive. CVS. Mild tachycardia with regular rhythm. Extremities. No edema, no cyanosis, pulses symmetrical bilaterally.  A/P. Her fever and nausea and vomiting is most likely due to viral illness. Drug reaction can be a possibility as she had her right heart cardiac cath today. She has no urinary complaints. -We will check UA, blood culture, chest x-ray and rapid influenza PCR. -Monitor temperature every 2 hourly. -Tylenol when necessary.  Tiann Saha PGY1 05/08/16.

## 2016-05-08 NOTE — Progress Notes (Signed)
Pt c/o nausea, vomiting, and dizziness prior to being discharged.  MD paged, new order placed.  Will carry out orders and continue to monitor.

## 2016-05-09 DIAGNOSIS — S40021A Contusion of right upper arm, initial encounter: Secondary | ICD-10-CM

## 2016-05-09 DIAGNOSIS — X58XXXA Exposure to other specified factors, initial encounter: Secondary | ICD-10-CM

## 2016-05-09 DIAGNOSIS — T148XXA Other injury of unspecified body region, initial encounter: Secondary | ICD-10-CM

## 2016-05-09 DIAGNOSIS — R509 Fever, unspecified: Secondary | ICD-10-CM

## 2016-05-09 DIAGNOSIS — N179 Acute kidney failure, unspecified: Secondary | ICD-10-CM

## 2016-05-09 LAB — BASIC METABOLIC PANEL
ANION GAP: 15 (ref 5–15)
BUN: 26 mg/dL — ABNORMAL HIGH (ref 6–20)
CO2: 24 mmol/L (ref 22–32)
Calcium: 9.2 mg/dL (ref 8.9–10.3)
Chloride: 95 mmol/L — ABNORMAL LOW (ref 101–111)
Creatinine, Ser: 1.37 mg/dL — ABNORMAL HIGH (ref 0.44–1.00)
GFR, EST AFRICAN AMERICAN: 43 mL/min — AB (ref >60)
GFR, EST NON AFRICAN AMERICAN: 38 mL/min — AB (ref >60)
Glucose, Bld: 189 mg/dL — ABNORMAL HIGH (ref 65–99)
POTASSIUM: 3.9 mmol/L (ref 3.5–5.1)
SODIUM: 134 mmol/L — AB (ref 135–145)

## 2016-05-09 LAB — GLUCOSE, CAPILLARY
GLUCOSE-CAPILLARY: 214 mg/dL — AB (ref 65–99)
GLUCOSE-CAPILLARY: 221 mg/dL — AB (ref 65–99)
Glucose-Capillary: 168 mg/dL — ABNORMAL HIGH (ref 65–99)
Glucose-Capillary: 100 mg/dL — ABNORMAL HIGH (ref 65–99)

## 2016-05-09 LAB — URINALYSIS, ROUTINE W REFLEX MICROSCOPIC
BILIRUBIN URINE: NEGATIVE
Glucose, UA: NEGATIVE mg/dL
Ketones, ur: 5 mg/dL — AB
Leukocytes, UA: NEGATIVE
Nitrite: NEGATIVE
PROTEIN: NEGATIVE mg/dL
SPECIFIC GRAVITY, URINE: 1.013 (ref 1.005–1.030)
pH: 5 (ref 5.0–8.0)

## 2016-05-09 MED FILL — Heparin Sodium (Porcine) 2 Unit/ML in Sodium Chloride 0.9%: INTRAMUSCULAR | Qty: 500 | Status: AC

## 2016-05-09 NOTE — Progress Notes (Signed)
Droplet precaution was discontinued, Influenza pcr result was negative. Will continue to monitor pt.

## 2016-05-09 NOTE — Progress Notes (Signed)
   Subjective: Ms. Feigenbaum was seen and evaluated today at bedside. No complaints this morning other than right upper arm pain near injection sites. Reports she feels well and that nausea has resolved. She denies any chills, abdominal pain, dysuria, increased frequency, cough or shortness of breath. She is looking forward to discharge.  Objective:  Vital signs in last 24 hours: Vitals:   05/08/16 2206 05/09/16 0200 05/09/16 0508 05/09/16 0841  BP:   (!) 127/52 118/70  Pulse:   88 90  Resp:   18   Temp: 99.1 F (37.3 C) 98.6 F (37 C) (!) 100.7 F (38.2 C)   TempSrc: Oral Oral Oral   SpO2:   98%   Weight:   160 lb 1.6 oz (72.6 kg)   Height:       General: Very pleasant african [place] female initially sleeping comfortably on left side. In no acute distress upon awakening during interview. HENT: Mucous membranes moist. No ptosis.  Cardiovascular: Regular rate and rhythm. No murmur or rub appreciated. No obvious peripheral edema Pulmonary: CTA BL. Normal WOB  Skin: Warm, dry. Well perfused. Right arm with warmth, tenderness and fullness.  Psych: Mood is normal with pleasant demeanor.  Assessment/Plan:  Acute on Chronic Combined Systolic and Diastolic CHF:  Weight 782 lbs today, 168 on admission and appears to have diuresed well. She appears euvolemic on examination. HF team on board and are holding entresto and lasix today. They recommend keeping patient an additional day for observation as patient has been febrile following her RHC. Blood cultures were obtained and should result tomorrow. HF have made recommendations for her HF medications upon discharge and have arranged follow-up for the patient in the HF clinic next week.  -Renal function today 1.3, was 1.1 yesterday. Holding lasix and entresto. -Dig 0.125 mg daily -Spironolactone 12.5 mg daily -ASA 81 mg daily -Atorvastatin 40 mg daily -holding carvedilol for now  Type 2 Diabetes with long term insulin use: On Novolin at home, 25  units QAM and 15 units QPM. -Continue CBG checks -Continue Novolog 15 units BID while hospitalized, pt to resume usual dosing upon d/c  HTN: Controlled currently. -Holding Coreg.  Dispo: Discharge home tomorrow with close follow-up in HF clinic.   Einar Gip, D.O.  PGY-I Internal Medicine Resident Pager# [phone] 05/09/2016, 11:39 AM

## 2016-05-09 NOTE — Progress Notes (Signed)
  Date: 05/09/2016  Patient name: [name]  Medical record number: [id number]  Date of birth: November 04, 1945   This patient's plan of care was discussed with the house staff. Please see their note for complete details. I concur with their findings.Please see. Dr. Rober Minion note for further details. When I examined the patient today she was EXQUISITELY TENDER to palpation in her arm where she had two vaccines. She clearly has a hematoma at this site at minimum. Not clearly an abscess. Would continue warm compresses. I doubt she has bacteremia related to her catherization.   If she is still febrile tomorrow would re-evalute if she might need an I and D and otherwise start doxycyline (presuming no other ID source identified)       Truman Hayward, MD 05/09/2016, 3:02 PM

## 2016-05-09 NOTE — Progress Notes (Signed)
Transitions of Care Pharmacy Note  Plan:  Educated on - New medications: Entresto, spironolactone, digoxin - We discussed the importance of reviewing her  AVS/discharge summary to see what medications she is to take versus hold when home.  - Use pill box to organize meds. Addressed concerns regarding: - Patient was concerned about number of new "blood pressure" medications, explained that they have additional benefit in heart failure beyond blood pressure and that it is common for people to take this combination of medication. She seemed to understand this. -------------------------------------------- [name] is an 71 y.o. female who presents with a chief complaint acute on chronic CHF. In anticipation of discharge, pharmacy has reviewed this patient's prior to admission medication history, as well as current inpatient medications listed per the [facility].  Current medication indications, dosing, frequency, and notable side effects reviewed with patient. patient verbalized understanding of current inpatient medication regimen and is aware that the After Visit Summary when presented, will represent the most accurate medication list at discharge.   Assessment: Understanding of regimen: good Understanding of indications: good Potential of compliance: good Barriers to Obtaining Medications: No  Patient instructed to contact inpatient pharmacy team with further questions or concerns if needed.    Time spent preparing for discharge counseling: 20 minutes Time spent counseling patient: 15 minutes   Thank you for allowing pharmacy to be a part of this patient's care.  Demetrius Charity, PharmD Acute Care Pharmacy Resident  Pager: [phone] 05/09/2016

## 2016-05-09 NOTE — Progress Notes (Signed)
Advanced Heart Failure Rounding Note   Subjective:    RHC yesterday. Also received flu and pneumonia vaccines  Last night developed fever to 102 and n/v. UA negative. Bcx pending. No cough or chills.   Feels much better today. Now afebrile.    Objective:   Weight Range:  Vital Signs:   Temp:  [98.6 F (37 C)-102.9 F (39.4 C)] 100.7 F (38.2 C) (03/20 0508) Pulse Rate:  [88-99] 90 (03/20 0841) Resp:  [18] 18 (03/20 0508) BP: (118-134)/(52-70) 118/70 (03/20 0841) SpO2:  [96 %-100 %] 98 % (03/20 0508) Weight:  [72.6 kg (160 lb 1.6 oz)] 72.6 kg (160 lb 1.6 oz) (03/20 0508) Last BM Date: 05/07/16  Weight change: Filed Weights   05/06/16 0505 05/08/16 0613 05/09/16 0508  Weight: 75.1 kg (165 lb 9.6 oz) 73.6 kg (162 lb 4.8 oz) 72.6 kg (160 lb 1.6 oz)    Intake/Output:   Intake/Output Summary (Last 24 hours) at 05/09/16 1055 Last data filed at 05/09/16 0924  Gross per 24 hour  Intake              480 ml  Output              780 ml  Net             -300 ml     Physical Exam: General:  Well appearing. No resp difficulty  Lying in bed HEENT: normal Neck: supple. JVP 5. Carotids 2+ bilat; no bruits. Oropharynx clear Cor: PMI laterally displaced. Regular rate & rhythm. No s3. No murmur Lungs: clear. No wheeze or rhonchi  Abdomen: soft, nontender. No guarding , no distension. No hepatosplenomegaly. No bruits or masses. Good bowel sounds. Extremities: no cyanosis, clubbing, rash, edema. Warm no rash. RUE sore at injection site Neuro: alert & orientedx3, cranial nerves grossly intact. moves all 4 extremities w/o difficulty. Affect pleasant  Telemetry: NSR 70-80s. Occasional PVCs. Personally reviewed   Labs: Basic Metabolic Panel:  Recent Labs Lab 05/05/16 0720 05/06/16 0448 05/07/16 0446 05/08/16 0444 05/09/16 0312  NA 139 139 136 133* 134*  K 4.4 3.8 4.0 4.5 3.9  CL 103 105 101 100* 95*  CO2 23 30 29  21* 24  GLUCOSE 306* 127* 134* 160* 189*  BUN 25* 28*  27* 25* 26*  CREATININE 1.42* 1.22* 1.11* 1.10* 1.37*  CALCIUM 9.0 9.0 9.0 9.2 9.2    Liver Function Tests:  Recent Labs Lab 05/05/16 0720 05/06/16 0448  AST 71* 38  ALT 80* 59*  ALKPHOS 107 85  BILITOT 0.5 0.5  PROT 6.3* 5.6*  ALBUMIN 3.6 3.1*   No results for input(s): LIPASE, AMYLASE in the last 168 hours. No results for input(s): AMMONIA in the last 168 hours.  CBC:  Recent Labs Lab 05/05/16 0720 05/06/16 0448 05/08/16 2216  WBC 10.6* 7.1 13.5*  NEUTROABS  --   --  10.5*  HGB 12.1 10.7* 12.5  HCT 36.4 32.0* 36.3  MCV 94.5 93.3 90.5  PLT 232 188 194    Cardiac Enzymes:  Recent Labs Lab 05/05/16 1222 05/05/16 1742 05/05/16 2257  TROPONINI 0.08* 0.15* 0.11*    BNP: BNP (last 3 results)  Recent Labs  12/17/15 0318 03/17/16 0119 05/05/16 0720  BNP 657.7* 362.7* 866.9*    ProBNP (last 3 results) No results for input(s): PROBNP in the last 8760 hours.    Other results:  Imaging: No results found.   Medications:     Scheduled Medications: . aspirin EC  81 mg Oral Daily  . atorvastatin  40 mg Oral Daily  . digoxin  0.125 mg Oral Daily  . enoxaparin (LOVENOX) injection  40 mg Subcutaneous Q24H  . insulin aspart  0-15 Units Subcutaneous TID WC  . insulin aspart protamine- aspart  15 Units Subcutaneous BID WC  . losartan  25 mg Oral BID  . ondansetron  16 mg Oral Once  . sodium chloride flush  3 mL Intravenous Q12H  . sodium chloride flush  3 mL Intravenous Q12H  . spironolactone  25 mg Oral QHS    Infusions:   PRN Medications: sodium chloride, sodium chloride, acetaminophen, ondansetron (ZOFRAN) IV, promethazine, sodium chloride flush, sodium chloride flush   Assessment:    1. A/C systolic CHF - LVEF 25%, Grade 2 DD. Mild MR, Mild LAE, RV mildly reduced, PA peak pressure 21 mm Hg.     --cardiac cath 10/17 minimal CAD 2. HTN 3. Poorly controlled DM2 4. Acute hypoxic respiratory failure 5. AKI  6. Frequent PVCs 7. Febrile  illness   Plan/Discussion:     Suspect fever related to vaccines but cant exclude bacteremia related to cath. Creatinine up.   Will hold Entresto and lasix today. Encourage po intake.   Currently afebrile. HF stable. Would watch for another 24 hours. Await BCx. UA reviewed personally.   Need to quantify PVC burden.   Length of Stay: 3   Ashley Saperstein MD 05/09/2016, 10:55 AM  Advanced Heart Failure Team Pager [phone] (M-F; 7a - 4p)  Please contact [facility] for night-coverage after hours (4p -7a ) and weekends on [email or web address]

## 2016-05-10 LAB — GLUCOSE, CAPILLARY
Glucose-Capillary: 123 mg/dL — ABNORMAL HIGH (ref 65–99)
Glucose-Capillary: 165 mg/dL — ABNORMAL HIGH (ref 65–99)

## 2016-05-10 LAB — BASIC METABOLIC PANEL
Anion gap: 12 (ref 5–15)
BUN: 32 mg/dL — ABNORMAL HIGH (ref 6–20)
CHLORIDE: 93 mmol/L — AB (ref 101–111)
CO2: 26 mmol/L (ref 22–32)
Calcium: 8.8 mg/dL — ABNORMAL LOW (ref 8.9–10.3)
Creatinine, Ser: 1.19 mg/dL — ABNORMAL HIGH (ref 0.44–1.00)
GFR calc Af Amer: 52 mL/min — ABNORMAL LOW (ref >60)
GFR calc non Af Amer: 44 mL/min — ABNORMAL LOW (ref >60)
GLUCOSE: 96 mg/dL (ref 65–99)
POTASSIUM: 3.7 mmol/L (ref 3.5–5.1)
Sodium: 131 mmol/L — ABNORMAL LOW (ref 135–145)

## 2016-05-10 LAB — DIGOXIN LEVEL: Digoxin Level: 0.5 ng/mL — ABNORMAL LOW (ref 0.8–2.0)

## 2016-05-10 LAB — MAGNESIUM: Magnesium: 1.9 mg/dL (ref 1.7–2.4)

## 2016-05-10 MED ORDER — SPIRONOLACTONE 25 MG PO TABS: 25.0000 mg | ORAL_TABLET | Freq: Every day | ORAL | 6 refills | Status: DC

## 2016-05-10 MED ORDER — FUROSEMIDE 40 MG PO TABS: 40.0000 mg | ORAL_TABLET | Freq: Two times a day (BID) | ORAL | 6 refills | Status: DC

## 2016-05-10 MED ORDER — MAGNESIUM SULFATE 2 GM/50ML IV SOLN
2.0000 g | Freq: Once | INTRAVENOUS | Status: AC
  Administered 2016-05-10: 2 g via INTRAVENOUS
  Filled 2016-05-10: qty 50

## 2016-05-10 MED ORDER — FUROSEMIDE 40 MG PO TABS
40.0000 mg | ORAL_TABLET | Freq: Two times a day (BID) | ORAL | Status: DC
  Administered 2016-05-10: 40 mg via ORAL
  Filled 2016-05-10: qty 1

## 2016-05-10 MED ORDER — SACUBITRIL-VALSARTAN 24-26 MG PO TABS
1.0000 | ORAL_TABLET | Freq: Two times a day (BID) | ORAL | Status: DC
  Administered 2016-05-10: 1 via ORAL
  Filled 2016-05-10: qty 1

## 2016-05-10 NOTE — Progress Notes (Signed)
Advanced Heart Failure Rounding Note   Subjective:    RHC 05/08/16. Also received flu and pneumonia vaccines  Developed fever of 102 thought to be 2/2 to vaccines. Accompanied N/V.   Tmax 101.1.   No further N/V.   UA negative. BCx NGTD  Feeling better this morning. UE remains sore. Hematoma noted at injection site(s).   Out 300 cc. Weight up 1 lb. Losartan and lasix held yesterday.  Denies dyspnea. BP improved. No further fevers. BCx still pending .  Echo 05/05/16 LVEF 20%, Grade 2 DD, RV mildly reduced, PA peak pressure 21 mm Hg  RHC 05/08/16 RA = 3 RV = 34/5 PA = 38/12 (21) PCW = 12 Fick cardiac output/index = 4.6/2.6 PVR = 1.9 WU Ao sat = 96% PA sat = 58%, 62%  Objective:   Weight Range:  Vital Signs:   Temp:  [98.2 F (36.8 C)-101.1 F (38.4 C)] 99.6 F (37.6 C) (03/21 0629) Pulse Rate:  [81-90] 81 (03/21 0629) Resp:  [18] 18 (03/21 0629) BP: (99-138)/(42-70) 113/49 (03/21 0629) SpO2:  [96 %-100 %] 98 % (03/21 0629) Weight:  [161 lb 11.2 oz (73.3 kg)] 161 lb 11.2 oz (73.3 kg) (03/21 0629) Last BM Date: 05/07/16  Weight change: Filed Weights   05/08/16 0613 05/09/16 0508 05/10/16 0629  Weight: 162 lb 4.8 oz (73.6 kg) 160 lb 1.6 oz (72.6 kg) 161 lb 11.2 oz (73.3 kg)    Intake/Output:   Intake/Output Summary (Last 24 hours) at 05/10/16 0834 Last data filed at 05/10/16 0647  Gross per 24 hour  Intake              720 ml  Output             1100 ml  Net             -380 ml     Physical Exam: General:  Well appearing. NAD lying in bed no dyspnea.  HEENT: Normal anicteric Neck: supple. JVP 6-7 cm. Carotids 2+ bilat; no bruits. Oropharynx clear Cor: PMI laterally displaced. RRR. No S3.no murmur Lungs: CTAB, normal effort no wheeze Abdomen: soft, NT, ND, no HSM. No bruits or masses. +BS  Extremities: No cyanosis, clubbing, rash, edema. RUE swelling at injection site Neuro: A&O 3. Cranial nerves grossly intact. Moves all 4 extremities w/o difficulty.  Affect pleasant.    Telemetry: Personally reviewed, NSR 70-80s, occasional PVCs.    Labs: Basic Metabolic Panel:  Recent Labs Lab 05/06/16 0448 05/07/16 0446 05/08/16 0444 05/09/16 0312 05/10/16 0546  NA 139 136 133* 134* 131*  K 3.8 4.0 4.5 3.9 3.7  CL 105 101 100* 95* 93*  CO2 30 29 21* 24 26  GLUCOSE 127* 134* 160* 189* 96  BUN 28* 27* 25* 26* 32*  CREATININE 1.22* 1.11* 1.10* 1.37* 1.19*  CALCIUM 9.0 9.0 9.2 9.2 8.8*  MG  --   --   --   --  1.9    Liver Function Tests:  Recent Labs Lab 05/05/16 0720 05/06/16 0448  AST 71* 38  ALT 80* 59*  ALKPHOS 107 85  BILITOT 0.5 0.5  PROT 6.3* 5.6*  ALBUMIN 3.6 3.1*   No results for input(s): LIPASE, AMYLASE in the last 168 hours. No results for input(s): AMMONIA in the last 168 hours.  CBC:  Recent Labs Lab 05/05/16 0720 05/06/16 0448 05/08/16 2216  WBC 10.6* 7.1 13.5*  NEUTROABS  --   --  10.5*  HGB 12.1 10.7* 12.5  HCT  36.4 32.0* 36.3  MCV 94.5 93.3 90.5  PLT 232 188 194    Cardiac Enzymes:  Recent Labs Lab 05/05/16 1222 05/05/16 1742 05/05/16 2257  TROPONINI 0.08* 0.15* 0.11*    BNP: BNP (last 3 results)  Recent Labs  12/17/15 0318 03/17/16 0119 05/05/16 0720  BNP 657.7* 362.7* 866.9*    ProBNP (last 3 results) No results for input(s): PROBNP in the last 8760 hours.    Other results:  Imaging: No results found.   Medications:     Scheduled Medications: . aspirin EC  81 mg Oral Daily  . atorvastatin  40 mg Oral Daily  . digoxin  0.125 mg Oral Daily  . enoxaparin (LOVENOX) injection  40 mg Subcutaneous Q24H  . insulin aspart  0-15 Units Subcutaneous TID WC  . insulin aspart protamine- aspart  15 Units Subcutaneous BID WC  . ondansetron  16 mg Oral Once  . sodium chloride flush  3 mL Intravenous Q12H  . sodium chloride flush  3 mL Intravenous Q12H  . spironolactone  25 mg Oral QHS    Infusions:   PRN Medications: sodium chloride, sodium chloride, acetaminophen,  ondansetron (ZOFRAN) IV, promethazine, sodium chloride flush, sodium chloride flush   Assessment:   1. A/C systolic CHF - LVEF 63%, Grade 2 DD. Mild MR, Mild LAE, RV mildly reduced, PA peak pressure 21 mm Hg.     --cardiac cath 10/17 minimal CAD 2. HTN 3. Poorly controlled DM2 4. Acute hypoxic respiratory failure 5. AKI  6. Frequent PVCs 7. Febrile illness   Plan/Discussion:    Tmax 101.1 yesterday afternoon. No further fevers. Suspect related to vaccines. Pt does have hematoma at site.  Primary following closely.   Creatinine improved with holding losartan and Lasix.  Will resume lasix and transition to [facility] (had been planned for discharge) with improved oral intake.   Need to quantify PVC burden. Can plan Holter as outpatient.   HF follow up set up for next week.  HF Meds for home Lasix mg 40 mg BID Entresto  24/26 mg BID Atorvastatin 40 mg daily Digoxin 0.125 mg daily Spironolactone 25 mg daily ASA 81 mg daily  Length of Stay: 4  Shirley Friar, PA-C  05/10/2016, 8:34 AM  Advanced Heart Failure Team Pager [phone] (M-F; 7a - 4p)  Please contact [facility] for night-coverage after hours (4p -7a ) and weekends on [email or web address]  Patient seen and examined with Oda Kilts, PA-C. We discussed all aspects of the encounter. I agree with the assessment and plan as stated above.   She is improved. Suspect fevers related to vaccines and not transient bacteremia from cath. Bcx still pending. Volume status ok. Renal function improved.   Agree with medicines as above. Can go home today. Will follow closely in HF clinic. D/w primary team.   Glori Bickers, MD  12:32 AM

## 2016-05-10 NOTE — Care Management Important Message (Signed)
Important Message  Patient Details  Name: [name] MRN: [id number] Date of Birth: 04-03-1945   Medicare Important Message Given:  Yes    Tynslee Bowlds 05/10/2016, 12:12 PM

## 2016-05-10 NOTE — Progress Notes (Signed)
Patient resting quietly with no complaints of nausea or vomiting.

## 2016-05-10 NOTE — Progress Notes (Signed)
   Subjective: Ms. Granquist was seen and evaluated today at bedside. No complaints. Afebrile overnight. Looking forward to discharge. Has follow up next week with advanced HF.   Objective:  Vital signs in last 24 hours: Vitals:   05/09/16 1208 05/09/16 1423 05/09/16 1604 05/09/16 1943  BP: (!) 120/50  (!) 99/42 138/61  Pulse: 85  83 88  Resp: 18  18 18   Temp: (!) 101.1 F (38.4 C) 98.4 F (36.9 C) 99.8 F (37.7 C) 98.2 F (36.8 C)  TempSrc: Oral Axillary Oral Oral  SpO2: 100%  96% 99%  Weight:      Height:       General: Very pleasant african american walking around room. In no acute distress. HENT: Mucous membranes moist. No ptosis.  Cardiovascular: Regular rate and rhythm. No murmur or rub appreciated. No obvious peripheral edema Pulmonary: CTA BL. Normal WOB  Skin: Warm, dry. Well perfused. Right arm with warmth, tenderness and fullness.  Psych: Mood is normal with pleasant demeanor.  Assessment/Plan:  Acute on Chronic Combined Systolic and Diastolic CHF:  Weight 803 lbs today, 168 on admission and appears to have diuresed well. She appears euvolemic on examination. HF have made recommendations for her HF medications upon discharge and have arranged follow-up for the patient in the HF clinic next week.  -Dig 0.125 mg daily -Spironolactone 12.5 mg daily -ASA 81 mg daily -Atorvastatin 40 mg daily -holding carvedilol for now -Entresto and lasix upon d/c  Type 2 Diabetes with long term insulin use: On Novolin at home, 25 units QAM and 15 units QPM. -Continue CBG checks -Continue Novolog 15 units BID while hospitalized, pt to resume usual dosing upon d/c  HTN: Controlled currently. -Holding Coreg.  Dispo: Discharge home today with close follow-up in HF clinic.   Einar Gip, D.O.  PGY-I Internal Medicine Resident Pager# [phone] 05/10/2016, 6:24 AM

## 2016-05-13 LAB — CULTURE, BLOOD (ROUTINE X 2)
Culture: NO GROWTH
Culture: NO GROWTH

## 2016-05-16 ENCOUNTER — Ambulatory Visit (HOSPITAL_COMMUNITY)
Admit: 2016-05-16 | Discharge: 2016-05-16 | Disposition: A | Discharge status: Home / Self Care | Payer: Self-pay | Attending: Internal Medicine | Admitting: Internal Medicine

## 2016-05-16 VITALS — BP 134/58 | HR 84 | Wt 162.1 lb

## 2016-05-16 DIAGNOSIS — Z7982 Long term (current) use of aspirin: Secondary | ICD-10-CM | POA: Insufficient documentation

## 2016-05-16 DIAGNOSIS — R002 Palpitations: Secondary | ICD-10-CM

## 2016-05-16 DIAGNOSIS — Z87891 Personal history of nicotine dependence: Secondary | ICD-10-CM | POA: Insufficient documentation

## 2016-05-16 DIAGNOSIS — I5042 Chronic combined systolic (congestive) and diastolic (congestive) heart failure: Secondary | ICD-10-CM | POA: Insufficient documentation

## 2016-05-16 DIAGNOSIS — I429 Cardiomyopathy, unspecified: Secondary | ICD-10-CM | POA: Insufficient documentation

## 2016-05-16 DIAGNOSIS — I2510 Atherosclerotic heart disease of native coronary artery without angina pectoris: Secondary | ICD-10-CM | POA: Insufficient documentation

## 2016-05-16 DIAGNOSIS — J449 Chronic obstructive pulmonary disease, unspecified: Secondary | ICD-10-CM | POA: Insufficient documentation

## 2016-05-16 DIAGNOSIS — Z794 Long term (current) use of insulin: Secondary | ICD-10-CM | POA: Insufficient documentation

## 2016-05-16 DIAGNOSIS — I5022 Chronic systolic (congestive) heart failure: Secondary | ICD-10-CM

## 2016-05-16 DIAGNOSIS — E1165 Type 2 diabetes mellitus with hyperglycemia: Secondary | ICD-10-CM | POA: Insufficient documentation

## 2016-05-16 DIAGNOSIS — I110 Hypertensive heart disease with heart failure: Secondary | ICD-10-CM | POA: Insufficient documentation

## 2016-05-16 DIAGNOSIS — I493 Ventricular premature depolarization: Secondary | ICD-10-CM

## 2016-05-16 DIAGNOSIS — Z853 Personal history of malignant neoplasm of breast: Secondary | ICD-10-CM | POA: Insufficient documentation

## 2016-05-16 DIAGNOSIS — I10 Essential (primary) hypertension: Secondary | ICD-10-CM

## 2016-05-16 DIAGNOSIS — N179 Acute kidney failure, unspecified: Secondary | ICD-10-CM

## 2016-05-16 LAB — BASIC METABOLIC PANEL
ANION GAP: 9 (ref 5–15)
BUN: 46 mg/dL — ABNORMAL HIGH (ref 6–20)
CALCIUM: 9.5 mg/dL (ref 8.9–10.3)
CO2: 31 mmol/L (ref 22–32)
Chloride: 96 mmol/L — ABNORMAL LOW (ref 101–111)
Creatinine, Ser: 1.38 mg/dL — ABNORMAL HIGH (ref 0.44–1.00)
GFR, EST AFRICAN AMERICAN: 43 mL/min — AB (ref >60)
GFR, EST NON AFRICAN AMERICAN: 37 mL/min — AB (ref >60)
Glucose, Bld: 182 mg/dL — ABNORMAL HIGH (ref 65–99)
POTASSIUM: 5.3 mmol/L — AB (ref 3.5–5.1)
Sodium: 136 mmol/L (ref 135–145)

## 2016-05-16 MED ORDER — CARVEDILOL 3.125 MG PO TABS: 3.1250 mg | ORAL_TABLET | Freq: Two times a day (BID) | ORAL | 6 refills | Status: DC

## 2016-05-16 MED ORDER — SACUBITRIL-VALSARTAN 24-26 MG PO TABS: 1.0000 | ORAL_TABLET | Freq: Two times a day (BID) | ORAL | 11 refills | Status: DC

## 2016-05-16 NOTE — Progress Notes (Signed)
Advanced Heart Failure Medication Review by a Pharmacist  Does the patient  feel that his/her medications are working for him/her?  yes  Has the patient been experiencing any side effects to the medications prescribed?  no  Does the patient measure his/her own blood pressure or blood glucose at home?  no   Does the patient have any problems obtaining medications due to transportation or finances?   yes - no insurance currently - will fill out PAF for Entresto, all other HF meds $4   Understanding of regimen: good Understanding of indications: good Potential of compliance: good - daughter fills pillbox Patient understands to avoid NSAIDs. Patient understands to avoid decongestants.  Issues to address at subsequent visits: [name] PAF   Pharmacist comments: [name] is a pleasant 71 yo AA female [facility] with her daughter. She brought her medication bottles to her appointment today. Her spironolactone bottle states 1/2 tablet daily, but she has been taking 1 tablet (2 halves) daily. She complains of the size of her OTC magnesium tablets and would like Rx magnesium oxide which are smaller. No other questions or concerns.   [name], PharmD PGY1 Pharmacy Resident [phone] (Pager) 05/16/2016 9:16 AM  Time with patient: 10 min Preparation and documentation time: 2 min Total time: 12 min

## 2016-05-16 NOTE — Patient Instructions (Addendum)
START Carvedilol (Coreg) 3.125 mg tablet twice daily. Rx has been sent to [facility]  Routine lab work today. Will notify you of abnormal results, otherwise no news is good news!  EKG today shows frequent PVCs.  Will schedule you for a holter monitor (heart monitor) to be placed at [facility] office. Address: [address] ([place]), [place], [place] [address]  Phone: [phone]  ________________________________________________________________________  ________________________________________________________________________  Follow up with Amy Clegg NP-C in 2 weeks.  ________________________________________________________________________  ________________________________________________________________________  Do the following things EVERYDAY: 1) Weigh yourself in the morning before breakfast. Write it down and keep it in a log. 2) Take your medicines as prescribed 3) Eat low salt foods-Limit salt (sodium) to 2000 mg per day.  4) Stay as active as you can everyday 5) Limit all fluids for the day to less than 2 liters

## 2016-05-16 NOTE — Progress Notes (Signed)
PCP: Dr Azucena Freed  Primary HF Cardiologist: Dr Haroldine Laws  HPI: [name] is a 71 y.o. female with chronic combined CHF, poorly controlled DM2, HTN, remote history of Breast CA s/p lumpectomy and radiation as well as chemo (received tamoxifen), Tobacco abuse, and remote DVT.   Admitted 76/7341 with A/C Systolic HF. Echo at that time with EF 40-45%. Cath during that admission with mild to moderate, but non-obstructive CAD including 15% distal LAD stenosis, 30% ulcerated mid LCx disease, and 40% mid RCA stenosis.   Admitted 3/16 through 9/37 with A/C Systolic Heart Failure. ECHO showed EF was down to 20%. RHC completed with well compensated filling pressures. Concern that PVCs may be a reason for low EF or possible Tak-tsubo. Also concerned about possible low output so bb was held.  Discharge weight was 162 pounds.   She returns for post hospital follow up. Overall feeling ok. Denies SOB/PND/Orthopnea. Weight at home 157-160 pounds. Having some palpitations. Taking all medications. Following low salt diet and limiting fluid intake to < 2 liters per day. Working part time at [employer] as a Research scientist (physical sciences).  Lives alone.   Cardiac Studies  ECHO 3/16/208 EF 20% Garde II DD ECHO 11/2015 EF 40-45% Grade IIDD  CMRI 07/07/2008 -  1.  Moderate LV systolic dysfunction with EF 38%.  The inferior and inferolateral basilar wall appear segmentally worse.  2.  Very small area of mid-wall delayed enhancement in the inferolateral base. This is nonspecific and can be seen with infiltrative diseases such as sarcoid as well as with myocarditis.  RHC 05/08/2016  RA = 3 RV = 34/5 PA = 38/12 (21) PCW = 12 Fick cardiac output/index = 4.6/2.6 PVR = 1.9 WU Ao sat = 96% PA sat = 58%, 62% Assessment: 1. Well compensated filling pressures and cardiac output  LHC 12/20/2015 1. Mild to moderate, nonobstructive coronary artery disease, including 15% distal LAD stenosis, 30% ulcerated mid LCx disease, and 40% mid RCA  stenosis. 2. Upper normal left ventricular filling pressure (LVEDP 15 mmHg).   ROS: All systems negative except as listed in HPI, PMH and Problem List.  SH:  Social History   Social History  . Marital status: Widowed    Spouse name: N/A  . Number of children: N/A  . Years of education: N/A   Occupational History  . Not on file.   Social History Main Topics  . Smoking status: Former Smoker    Years: 30.00    Quit date: 09/29/2012  . Smokeless tobacco: Never Used  . Alcohol use No  . Drug use: No  . Sexual activity: Not on file   Other Topics Concern  . Not on file   Social History Narrative  . No narrative on file    FH:  Family History  Problem Relation Age of Onset  . Stroke Father     Past Medical History:  Diagnosis Date  . Breast cancer ([place])    breast - left   . CAD (coronary artery disease)    a. nonobs by LHC in 2010 // b. Myoview 10/17: Lg infarct apex, distal ant and lat walls, no ischemia, EF 38; int risk  // c. LHC 10/17:  dLAD 15, pLCx 30, mRCA 40, LVEDP 15  . Chronic combined systolic and diastolic CHF (congestive heart failure) ([place])    a. cMRI 5/10: EF 38% // b. Echo 3/13/: EF 50-55, Gr 1 DD // c. Echo 8/14: EF 40, inf/inf-septal HK, Gr 1 DD, mild MR // d.  Echo 5/17: EF 40-45, inf HK, Gr 1 DD, mild MR, mild LAE, reduced RVSF, mild TR, PASP 33 // e. Echo 10/17: EF 40-45, Gr 2 DD, mild MR, severe LAE  . COPD (chronic obstructive pulmonary disease) ([place])   . Diabetes mellitus   . Dyspnea   . NICM (nonischemic cardiomyopathy) ([place])     Current Outpatient Prescriptions  Medication Sig Dispense Refill  . aspirin 81 MG chewable tablet Chew 1 tablet (81 mg total) by mouth daily.    Marland Kitchen atorvastatin (LIPITOR) 40 MG tablet Take 1 tablet (40 mg total) by mouth daily. 30 tablet 6  . digoxin (LANOXIN) 0.125 MG tablet Take 1 tablet (0.125 mg total) by mouth daily. 30 tablet 0  . furosemide (LASIX) 40 MG tablet Take 1 tablet (40 mg total) by mouth 2 (two)  times daily. 60 tablet 6  . insulin NPH-regular Human (NOVOLIN 70/30) (70-30) 100 UNIT/ML injection Please start using 25 units in am and 15 units at bedtime. Check CBG's three times a day (including fasting in am) (Patient taking differently: Please start using 15 units in am and 20 units at bedtime. Check CBG's three times a day (including fasting in am)) 10 mL 2  . magnesium oxide (MAG-OX) 400 (241.3 Mg) MG tablet Take 1 tablet (400 mg total) by mouth 2 (two) times daily. 60 tablet 3  . sacubitril-valsartan (ENTRESTO) 24-26 MG Take 1 tablet by mouth 2 (two) times daily. 60 tablet 0  . spironolactone (ALDACTONE) 25 MG tablet Take 1 tablet (25 mg total) by mouth at bedtime. 30 tablet 6   No current facility-administered medications for this encounter.     Vitals:   05/16/16 0902  BP: (!) 134/58  Pulse: 84  SpO2: 100%  Weight: 162 lb 2 oz (73.5 kg)    PHYSICAL EXAM: General:  Walked in the clinic with his daughter. NAD. Daughter present.  HEENT: normal Neck: supple. JVP 5-6. Carotids 2+ bilaterally; no bruits. No lymphadenopathy or thryomegaly appreciated. Cor: PMI normal. Irregular rate & rhythm. No rubs, gallops or murmurs. I do not hear a 3rd heart sound Lungs: CTAB  Abdomen: soft, nontender, nondistended. No hepatosplenomegaly. No bruits or masses. Good bowel sounds. Extremities: no cyanosis, clubbing, rash, R and LLE no edema.  Neuro: alert & orientedx3, cranial nerves grossly intact. Moves all 4 extremities w/o difficulty. Affect pleasant.  ECG: NSR 78 bpm with PVCs.   ASSESSMENT & PLAN: 1. Chronic  systolic CHF - NICM.Had CMRI 2010  LVEF 38% and question about possible sarcoid. Look back at [facility] EF is down from 40% in 2017 to 20% in 2018.  04/2016 LVEF 20%, Grade 2 DD. Mild MR, Mild LAE, RV mildly reduced, PA peak pressure 21 mm Hg.     --cardiac cath 10/17 minimal CAD -- Had RHC 04/2016 well compesated cardiac output.  Plan for ECHO after HF meds optimized. Likely the end of  June.  NYHA II. Volume status stable. Continue lasix 40 mg twice a day + 25 mg spiro daily.  Continue dig 0.125 mg daily. Dig level 0.5  Continue entresto 24-26 mg twice a day.  Add coreg 3.125 mg twice a day.  Do the following things EVERYDAY: 1) Weigh yourself in the morning before breakfast. Write it down and keep it in a log. 2) Take your medicines as prescribed 3) Eat low salt foods-Limit salt (sodium) to 2000 mg per day.  4) Stay as active as you can everyday 5) Limit all fluids for the day to  less than 2 liters 2. HTN- Stable.  3. DMII: Hgb A1C Poorly controlled DM2 4. AKI recent hospitalization. Creatinine on 3/20 was 1.37. Check BMET today.  5. Frequent PVCs- Place 48 hour Holter Monitor to quantify PVC burden.   Follow up in 2 weeks. BMET today.   Graciela Plato NP-C  12:30 PM

## 2016-05-19 NOTE — Addendum Note (Signed)
Encounter addended by: Conrad [place], NP on: 05/19/2016  7:53 AM<BR>    Actions taken: LOS modified

## 2016-05-22 ENCOUNTER — Telehealth (HOSPITAL_COMMUNITY): Payer: Self-pay | Admitting: Pharmacist

## 2016-05-22 MED ORDER — SACUBITRIL-VALSARTAN 24-26 MG PO TABS: 1.0000 | ORAL_TABLET | Freq: Two times a day (BID) | ORAL | 11 refills | Status: DC

## 2016-05-22 NOTE — Telephone Encounter (Signed)
[employer] patient assistance pre-approved for 90 day supply of Entresto 24-26 mg BID (ID: [id number]). Once application completely reviewed, will have permanent decision on yearly approval.   Katiana Ruland K. Velva Harman, PharmD, BCPS, CPP Clinical Pharmacist Pager: [phone] Phone: [phone] 05/22/2016 12:10 PM

## 2016-05-23 NOTE — Addendum Note (Signed)
Encounter addended by: Conrad [place], NP on: 05/23/2016  9:49 AM<BR>    Actions taken: Follow-up modified

## 2016-05-26 ENCOUNTER — Ambulatory Visit (INDEPENDENT_AMBULATORY_CARE_PROVIDER_SITE_OTHER): Payer: Self-pay

## 2016-05-26 DIAGNOSIS — R002 Palpitations: Secondary | ICD-10-CM

## 2016-05-30 ENCOUNTER — Encounter (HOSPITAL_COMMUNITY): Payer: Self-pay

## 2016-05-30 ENCOUNTER — Ambulatory Visit (HOSPITAL_COMMUNITY)
Admission: RE | Admit: 2016-05-30 | Discharge: 2016-05-30 | Disposition: A | Discharge status: Home / Self Care | Payer: Self-pay | Source: Ambulatory Visit | Attending: Cardiology | Admitting: Cardiology

## 2016-05-30 VITALS — BP 104/64 | HR 61 | Wt 160.4 lb

## 2016-05-30 DIAGNOSIS — I5042 Chronic combined systolic (congestive) and diastolic (congestive) heart failure: Secondary | ICD-10-CM | POA: Insufficient documentation

## 2016-05-30 DIAGNOSIS — Z9221 Personal history of antineoplastic chemotherapy: Secondary | ICD-10-CM | POA: Insufficient documentation

## 2016-05-30 DIAGNOSIS — I5022 Chronic systolic (congestive) heart failure: Secondary | ICD-10-CM

## 2016-05-30 DIAGNOSIS — N179 Acute kidney failure, unspecified: Secondary | ICD-10-CM

## 2016-05-30 DIAGNOSIS — Z853 Personal history of malignant neoplasm of breast: Secondary | ICD-10-CM | POA: Insufficient documentation

## 2016-05-30 DIAGNOSIS — R002 Palpitations: Secondary | ICD-10-CM

## 2016-05-30 DIAGNOSIS — Z720 Tobacco use: Secondary | ICD-10-CM | POA: Insufficient documentation

## 2016-05-30 DIAGNOSIS — I10 Essential (primary) hypertension: Secondary | ICD-10-CM

## 2016-05-30 DIAGNOSIS — D869 Sarcoidosis, unspecified: Secondary | ICD-10-CM | POA: Insufficient documentation

## 2016-05-30 DIAGNOSIS — I493 Ventricular premature depolarization: Secondary | ICD-10-CM

## 2016-05-30 DIAGNOSIS — Z923 Personal history of irradiation: Secondary | ICD-10-CM | POA: Insufficient documentation

## 2016-05-30 DIAGNOSIS — Z9889 Other specified postprocedural states: Secondary | ICD-10-CM | POA: Insufficient documentation

## 2016-05-30 DIAGNOSIS — I2510 Atherosclerotic heart disease of native coronary artery without angina pectoris: Secondary | ICD-10-CM | POA: Insufficient documentation

## 2016-05-30 DIAGNOSIS — E1165 Type 2 diabetes mellitus with hyperglycemia: Secondary | ICD-10-CM | POA: Insufficient documentation

## 2016-05-30 DIAGNOSIS — I110 Hypertensive heart disease with heart failure: Secondary | ICD-10-CM | POA: Insufficient documentation

## 2016-05-30 MED ORDER — SPIRONOLACTONE 25 MG PO TABS: 25.0000 mg | ORAL_TABLET | Freq: Every day | ORAL | 6 refills | Status: DC

## 2016-05-30 MED ORDER — DIGOXIN 125 MCG PO TABS: 0.1250 mg | ORAL_TABLET | Freq: Every day | ORAL | 6 refills | Status: DC

## 2016-05-30 NOTE — Progress Notes (Signed)
PCP: Dr Azucena Freed  Primary HF Cardiologist: Dr Haroldine Laws  HPI: [name] is a 71 y.o. female with chronic combined CHF, poorly controlled DM2, HTN, remote history of Breast CA s/p lumpectomy and radiation as well as chemo (received tamoxifen), Tobacco abuse, and remote DVT.   Admitted 59/1638 with A/C Systolic HF. Echo at that time with EF 40-45%. Cath during that admission with mild to moderate, but non-obstructive CAD including 15% distal LAD stenosis, 30% ulcerated mid LCx disease, and 40% mid RCA stenosis.   Admitted 3/16 through 4/66 with A/C Systolic Heart Failure. ECHO showed EF was down to 20%. RHC completed with well compensated filling pressures. Concern that PVCs may be a reason for low EF or possible Takotsubo. Also concerned about possible low output so bb was held.  Discharge weight was 162 pounds.   Returns today for HF follow up. Feeling well, no SOB with walking. Can walk throughout the grocery store, can walk up the stairs in her home without SOB. Went shopping with her daughter all day the other day and did well. Denies dizziness, syncope and presyncope. Weights at home 157-159 pounds. Eating a low salt diet, drinking less than she usually does. Has cut back on her coffee intake (was drinking 6 cups a day). No lower extremity edema, sleeping on 2 pillows. No PND.   Cardiac Studies  ECHO 3/16/208 EF 20% Garde II DD ECHO 11/2015 EF 40-45% Grade IIDD  CMRI 07/07/2008 -  1.  Moderate LV systolic dysfunction with EF 38%.  The inferior and inferolateral basilar wall appear segmentally worse.  2.  Very small area of mid-wall delayed enhancement in the inferolateral base. This is nonspecific and can be seen with infiltrative diseases such as sarcoid as well as with myocarditis.  RHC 05/08/2016  RA = 3 RV = 34/5 PA = 38/12 (21) PCW = 12 Fick cardiac output/index = 4.6/2.6 PVR = 1.9 WU Ao sat = 96% PA sat = 58%, 62% Assessment: 1. Well compensated filling pressures  and cardiac output  LHC 12/20/2015 1. Mild to moderate, nonobstructive coronary artery disease, including 15% distal LAD stenosis, 30% ulcerated mid LCx disease, and 40% mid RCA stenosis. 2. Upper normal left ventricular filling pressure (LVEDP 15 mmHg).   ROS: All systems negative except as listed in HPI, PMH and Problem List.  SH:  Social History   Social History  . Marital status: Widowed    Spouse name: N/A  . Number of children: N/A  . Years of education: N/A   Occupational History  . Not on file.   Social History Main Topics  . Smoking status: Former Smoker    Years: 30.00    Quit date: 09/29/2012  . Smokeless tobacco: Never Used  . Alcohol use No  . Drug use: No  . Sexual activity: Not on file   Other Topics Concern  . Not on file   Social History Narrative  . No narrative on file    FH:  Family History  Problem Relation Age of Onset  . Stroke Father     Past Medical History:  Diagnosis Date  . Breast cancer ([place])    breast - left   . CAD (coronary artery disease)    a. nonobs by LHC in 2010 // b. Myoview 10/17: Lg infarct apex, distal ant and lat walls, no ischemia, EF 38; int risk  // c. LHC 10/17:  dLAD 15, pLCx 30, mRCA 40, LVEDP 15  . Chronic combined  systolic and diastolic CHF (congestive heart failure) ([place])    a. cMRI 5/10: EF 38% // b. Echo 3/13/: EF 50-55, Gr 1 DD // c. Echo 8/14: EF 40, inf/inf-septal HK, Gr 1 DD, mild MR // d. Echo 5/17: EF 40-45, inf HK, Gr 1 DD, mild MR, mild LAE, reduced RVSF, mild TR, PASP 33 // e. Echo 10/17: EF 40-45, Gr 2 DD, mild MR, severe LAE  . COPD (chronic obstructive pulmonary disease) ([place])   . Diabetes mellitus   . Dyspnea   . NICM (nonischemic cardiomyopathy) ([place])     Current Outpatient Prescriptions  Medication Sig Dispense Refill  . aspirin 81 MG chewable tablet Chew 1 tablet (81 mg total) by mouth daily.    Marland Kitchen atorvastatin (LIPITOR) 40 MG tablet Take 1 tablet (40 mg total) by mouth daily. 30 tablet 6    . carvedilol (COREG) 3.125 MG tablet Take 1 tablet (3.125 mg total) by mouth 2 (two) times daily. 60 tablet 6  . digoxin (LANOXIN) 0.125 MG tablet Take 1 tablet (0.125 mg total) by mouth daily. 30 tablet 0  . furosemide (LASIX) 40 MG tablet Take 1 tablet (40 mg total) by mouth 2 (two) times daily. 60 tablet 6  . insulin NPH-regular Human (NOVOLIN 70/30) (70-30) 100 UNIT/ML injection Please start using 25 units in am and 15 units at bedtime. Check CBG's three times a day (including fasting in am) (Patient taking differently: Please start using 15 units in am and 20 units at bedtime. Check CBG's three times a day (including fasting in am)) 10 mL 2  . magnesium oxide (MAG-OX) 400 (241.3 Mg) MG tablet Take 1 tablet (400 mg total) by mouth 2 (two) times daily. 60 tablet 3  . sacubitril-valsartan (ENTRESTO) 24-26 MG Take 1 tablet by mouth 2 (two) times daily. 60 tablet 11  . spironolactone (ALDACTONE) 25 MG tablet Take 1 tablet (25 mg total) by mouth at bedtime. 30 tablet 6   No current facility-administered medications for this encounter.     Vitals:   05/30/16 0916  BP: 104/64  Pulse: 61  SpO2: 100%  Weight: 160 lb 6.4 oz (72.8 kg)    PHYSICAL EXAM: General: NAD, walked into clinic without SOB.  HEENT:Normal  Neck: supple.No JVP  Carotids 2+ bilaterally; no bruits. No lymphadenopathy or thryomegaly appreciated. Cor: PMI normal. Regular  rate and rhythm. No rubs, gallops or murmurs Lungs: Clear to auscultation in all lobes.  Abdomen: soft, nontender, nondistended. No hepatosplenomegaly. No bruits or masses. Good bowel sounds. Extremities: no cyanosis, clubbing, rash, No edema.  Neuro: alert & orientedx3, cranial nerves grossly intact. Moves all 4 extremities w/o difficulty. Affect pleasant.   ASSESSMENT & PLAN: 1. Chronic systolic CHF:   - NICM. Left heart cath 10/17 with no CAD.  - Had CMRI 2010  LVEF 38% and question about possible sarcoid. Look back at [facility] EF is down from 40% in  2017 to 20% in 2018.  - 04/2016 LVEF 20%, Grade 2 DD. Mild MR, Mild LAE, RV mildly reduced, PA peak pressure 21 mm Hg.  - Had RHC 04/2016 well compesated cardiac output.  - Continue Entresto 24/.26mg  BID. She is approved for 3 month supply via patient assistance.  - Continue Coreg 3.125mg  BID.  - Continue Spiro 25mg  daily and lasix 40mg  BID.  - Continue digoxin 0.125mg  daily. Dig level 0.5 last month.  - BP is too low today to up titrate any meds, considered increasing her beta blocker at night time, but HR  is 61,  2. HTN- Well controlled on current regimen.  3. DMII: Per PCP  4. Acute on chronic kidney disease: - creatinine at baseline.  5. Frequent PVCs- Wore 48 hour monitor to quantify PVC burden, she just turned it in yesterday. Will follow results.   Follow up in 6 weeks.    Arbutus Leas NP-C  9:19 AM

## 2016-05-30 NOTE — Patient Instructions (Signed)
Refills of Spironolactone and Digoxin sent in to your pharmacy.  Entresto samples given.  No changes to medication at this time.  No lab work today.  Follow up 6 weeks.  Do the following things EVERYDAY: 1) Weigh yourself in the morning before breakfast. Write it down and keep it in a log. 2) Take your medicines as prescribed 3) Eat low salt foods-Limit salt (sodium) to 2000 mg per day.  4) Stay as active as you can everyday 5) Limit all fluids for the day to less than 2 liters

## 2016-05-31 ENCOUNTER — Other Ambulatory Visit: Payer: Self-pay | Admitting: Internal Medicine

## 2016-06-08 ENCOUNTER — Telehealth (HOSPITAL_COMMUNITY): Payer: Self-pay

## 2016-06-08 NOTE — Telephone Encounter (Signed)
Holter monitor - 48 hour  Order: [id number]  Status:  Final result Visible to patient:  Yes (MyChart) Dx:  Palpitations  Notes recorded by Effie Berkshire, RN on 06/08/2016 at 11:26 AM EDT 2 attempts made to reach patient, left VM stating stable results and no changes and to return our call if she would like to discuss further. ------  Notes recorded by Conrad [place], NP on 06/05/2016 at 12:13 PM EDT Low dose bb added at recent OV. Will follow. Please call. No changes at this time.

## 2016-06-19 ENCOUNTER — Telehealth (HOSPITAL_COMMUNITY): Payer: Self-pay | Admitting: Pharmacist

## 2016-06-19 NOTE — Telephone Encounter (Signed)
[employer] patient assistance approved for [employer] 24-26 mg BID through 06/16/17.   Ruta Hinds. Velva Harman, PharmD, BCPS, CPP Clinical Pharmacist Pager: [phone] Phone: [phone] 06/19/2016 3:58 PM

## 2016-06-23 ENCOUNTER — Telehealth (HOSPITAL_COMMUNITY): Payer: Self-pay | Admitting: *Deleted

## 2016-06-23 NOTE — Telephone Encounter (Signed)
Pt walked in c/o being out of Entresto and has not received shipment in the mail yet.  Per chart pt was approved for [employer] pt asst.  Earlier this week.  Samples provided  Medication Samples have been provided to the patient.  Drug name: Delene Loll       Strength: 24/26 mg        Qty: 2  LOT: [id number]  Exp.Date: 4/20  Dosing instructions: 1 tab Twice daily   The patient has been instructed regarding the correct time, dose, and frequency of taking this medication, including desired effects and most common side effects.   [name] 9:11 AM 06/23/2016

## 2016-07-11 ENCOUNTER — Ambulatory Visit (HOSPITAL_COMMUNITY)
Admission: RE | Admit: 2016-07-11 | Discharge: 2016-07-11 | Disposition: A | Discharge status: Home / Self Care | Payer: Self-pay | Source: Ambulatory Visit | Attending: Internal Medicine | Admitting: Internal Medicine

## 2016-07-11 VITALS — BP 136/62 | HR 82 | Wt 160.2 lb

## 2016-07-11 DIAGNOSIS — N179 Acute kidney failure, unspecified: Secondary | ICD-10-CM

## 2016-07-11 DIAGNOSIS — Z79899 Other long term (current) drug therapy: Secondary | ICD-10-CM | POA: Insufficient documentation

## 2016-07-11 DIAGNOSIS — E1122 Type 2 diabetes mellitus with diabetic chronic kidney disease: Secondary | ICD-10-CM | POA: Insufficient documentation

## 2016-07-11 DIAGNOSIS — N189 Chronic kidney disease, unspecified: Secondary | ICD-10-CM | POA: Insufficient documentation

## 2016-07-11 DIAGNOSIS — I130 Hypertensive heart and chronic kidney disease with heart failure and stage 1 through stage 4 chronic kidney disease, or unspecified chronic kidney disease: Secondary | ICD-10-CM | POA: Insufficient documentation

## 2016-07-11 DIAGNOSIS — I5042 Chronic combined systolic (congestive) and diastolic (congestive) heart failure: Secondary | ICD-10-CM | POA: Insufficient documentation

## 2016-07-11 DIAGNOSIS — Z87891 Personal history of nicotine dependence: Secondary | ICD-10-CM | POA: Insufficient documentation

## 2016-07-11 DIAGNOSIS — Z7982 Long term (current) use of aspirin: Secondary | ICD-10-CM | POA: Insufficient documentation

## 2016-07-11 DIAGNOSIS — Z794 Long term (current) use of insulin: Secondary | ICD-10-CM | POA: Insufficient documentation

## 2016-07-11 DIAGNOSIS — I428 Other cardiomyopathies: Secondary | ICD-10-CM | POA: Insufficient documentation

## 2016-07-11 DIAGNOSIS — I5022 Chronic systolic (congestive) heart failure: Secondary | ICD-10-CM

## 2016-07-11 DIAGNOSIS — I493 Ventricular premature depolarization: Secondary | ICD-10-CM | POA: Insufficient documentation

## 2016-07-11 DIAGNOSIS — I10 Essential (primary) hypertension: Secondary | ICD-10-CM

## 2016-07-11 DIAGNOSIS — R002 Palpitations: Secondary | ICD-10-CM

## 2016-07-11 DIAGNOSIS — I2510 Atherosclerotic heart disease of native coronary artery without angina pectoris: Secondary | ICD-10-CM | POA: Insufficient documentation

## 2016-07-11 LAB — BASIC METABOLIC PANEL
Anion gap: 8 (ref 5–15)
BUN: 20 mg/dL (ref 6–20)
CO2: 27 mmol/L (ref 22–32)
CREATININE: 1.07 mg/dL — AB (ref 0.44–1.00)
Calcium: 9.5 mg/dL (ref 8.9–10.3)
Chloride: 100 mmol/L — ABNORMAL LOW (ref 101–111)
GFR calc Af Amer: 59 mL/min — ABNORMAL LOW (ref >60)
GFR, EST NON AFRICAN AMERICAN: 51 mL/min — AB (ref >60)
Glucose, Bld: 314 mg/dL — ABNORMAL HIGH (ref 65–99)
Potassium: 4.7 mmol/L (ref 3.5–5.1)
SODIUM: 135 mmol/L (ref 135–145)

## 2016-07-11 MED ORDER — CARVEDILOL 6.25 MG PO TABS: 6.2500 mg | ORAL_TABLET | Freq: Two times a day (BID) | ORAL | 6 refills | Status: DC

## 2016-07-11 NOTE — Patient Instructions (Signed)
INCREASE Coreg to 6.25 mg (1 Tablet) Two Times Daily  Labs today (will call for abnormal results, otherwise no news is good news)  Follow up in 3 Months

## 2016-07-11 NOTE — Progress Notes (Addendum)
PCP: Dr Azucena Freed  Primary HF Cardiologist: Dr Haroldine Laws  HPI: [name] is a 71 y.o. female with chronic combined CHF, poorly controlled DM2, HTN, remote history of Breast CA s/p lumpectomy and radiation as well as chemo (received tamoxifen), Tobacco abuse, and remote DVT.   Admitted 09/1446 with A/C Systolic HF. Echo at that time with EF 40-45%. Cath during that admission with mild to moderate, but non-obstructive CAD including 15% distal LAD stenosis, 30% ulcerated mid LCx disease, and 40% mid RCA stenosis.   Admitted 05/05/16 through 1/85/63 with A/C Systolic Heart Failure. ECHO showed EF was down to 20%. RHC completed with well compensated filling pressures. Concern that PVCs may be a reason for low EF or possible Takotsubo. Also concerned about possible low output so bb was held.  Discharge weight was 162 pounds.   She returns today for HF follow up. She is feeling well overall, waked into clinic without difficulty. No SOB with stairs, no SOB with walking through the grocery store. Denies orthopnea and PND. She is drinking more than 2L a day. Eating some high salt foods, but does well for the most part. Weight at home 158 pounds. There was some confusion with her Delene Loll- She gets it through mail order and was sent the wrong medication. She then contacted our office and was given samples, but has been taking it once a day for the past week so that she doesn't run out. Our PharmD gave her samples and her mail order shipment is on the way. She denies orthostatic symptoms. Holter monitor showed 5.8% PVC's.     Cardiac Studies  ECHO 3/16/208 EF 20% Garde II DD ECHO 11/2015 EF 40-45% Grade IIDD  CMRI 07/07/2008 -  1.  Moderate LV systolic dysfunction with EF 38%.  The inferior and inferolateral basilar wall appear segmentally worse.  2.  Very small area of mid-wall delayed enhancement in the inferolateral base. This is nonspecific and can be seen with infiltrative diseases such as  sarcoid as well as with myocarditis.  RHC 05/08/2016  RA = 3 RV = 34/5 PA = 38/12 (21) PCW = 12 Fick cardiac output/index = 4.6/2.6 PVR = 1.9 WU Ao sat = 96% PA sat = 58%, 62% Assessment: 1. Well compensated filling pressures and cardiac output  LHC 12/20/2015 1. Mild to moderate, nonobstructive coronary artery disease, including 15% distal LAD stenosis, 30% ulcerated mid LCx disease, and 40% mid RCA stenosis. 2. Upper normal left ventricular filling pressure (LVEDP 15 mmHg).   ROS: All systems negative except as listed in HPI, PMH and Problem List.  SH:  Social History   Social History  . Marital status: Widowed    Spouse name: N/A  . Number of children: N/A  . Years of education: N/A   Occupational History  . Not on file.   Social History Main Topics  . Smoking status: Former Smoker    Years: 30.00    Quit date: 09/29/2012  . Smokeless tobacco: Never Used  . Alcohol use No  . Drug use: No  . Sexual activity: Not on file   Other Topics Concern  . Not on file   Social History Narrative  . No narrative on file    FH:  Family History  Problem Relation Age of Onset  . Stroke Father     Past Medical History:  Diagnosis Date  . Breast cancer ([place])    breast - left   . CAD (coronary artery disease)  a. nonobs by LHC in 2010 // b. Myoview 10/17: Lg infarct apex, distal ant and lat walls, no ischemia, EF 38; int risk  // c. LHC 10/17:  dLAD 15, pLCx 30, mRCA 40, LVEDP 15  . Chronic combined systolic and diastolic CHF (congestive heart failure) ([place])    a. cMRI 5/10: EF 38% // b. Echo 3/13/: EF 50-55, Gr 1 DD // c. Echo 8/14: EF 40, inf/inf-septal HK, Gr 1 DD, mild MR // d. Echo 5/17: EF 40-45, inf HK, Gr 1 DD, mild MR, mild LAE, reduced RVSF, mild TR, PASP 33 // e. Echo 10/17: EF 40-45, Gr 2 DD, mild MR, severe LAE  . COPD (chronic obstructive pulmonary disease) ([place])   . Diabetes mellitus   . Dyspnea   . NICM (nonischemic cardiomyopathy) ([place])      Current Outpatient Prescriptions  Medication Sig Dispense Refill  . aspirin 81 MG chewable tablet Chew 1 tablet (81 mg total) by mouth daily.    Marland Kitchen atorvastatin (LIPITOR) 40 MG tablet Take 1 tablet (40 mg total) by mouth daily. 30 tablet 6  . carvedilol (COREG) 3.125 MG tablet Take 1 tablet (3.125 mg total) by mouth 2 (two) times daily. 60 tablet 6  . digoxin (LANOXIN) 0.125 MG tablet Take 1 tablet (0.125 mg total) by mouth daily. 30 tablet 6  . furosemide (LASIX) 40 MG tablet Take 1 tablet (40 mg total) by mouth 2 (two) times daily. 60 tablet 6  . insulin NPH-regular Human (NOVOLIN 70/30) (70-30) 100 UNIT/ML injection Please start using 25 units in am and 15 units at bedtime. Check CBG's three times a day (including fasting in am) 10 mL 2  . magnesium oxide (MAG-OX) 400 (241.3 Mg) MG tablet Take 1 tablet (400 mg total) by mouth 2 (two) times daily. 60 tablet 3  . spironolactone (ALDACTONE) 25 MG tablet Take 1 tablet (25 mg total) by mouth at bedtime. 30 tablet 6  . sacubitril-valsartan (ENTRESTO) 24-26 MG Take 1 tablet by mouth 2 (two) times daily. (Patient not taking: Reported on 07/11/2016) 60 tablet 11   No current facility-administered medications for this encounter.     Vitals:   07/11/16 1016  BP: 136/62  Pulse: 82  SpO2: 98%  Weight: 160 lb 3.2 oz (72.7 kg)    PHYSICAL EXAM: General: NAD, walked into clinic without SOB.  HEENT:Normal  Neck: supple.No JVP  Carotids 2+ bilaterally; no bruits. No lymphadenopathy or thryomegaly appreciated. Cor: PMI normal. Regular  rate and rhythm. No rubs, gallops or murmurs Lungs: Clear to auscultation in all lobes.  Abdomen: soft, nontender, nondistended. No hepatosplenomegaly. No bruits or masses. Good bowel sounds. Extremities: no cyanosis, clubbing, rash, No edema.  Neuro: alert & orientedx3, cranial nerves grossly intact. Moves all 4 extremities w/o difficulty. Affect pleasant.   ASSESSMENT & PLAN: 1. Chronic systolic CHF: NICM.  Left heart cath 10/17 with no CAD. Had CMRI 2010  LVEF 38% and question about possible sarcoid. Look back at [facility] EF is down from 40% in 2017 to 20% in 2018. 04/2016 LVEF 20%, Grade 2 DD. Mild MR, Mild LAE, RV mildly reduced, PA peak pressure 21 mm Hg.  - Had RHC 04/2016 well compesated cardiac output.  - NYHA II, volume status stable.  - Continue Entresto 24/26mg  BID. She is approved for 3 month supply via patient assistance.  - Increase Coreg to 6.25mg  BID.  - Continue Spiro 25mg  daily  -  Lasix 40mg  BID.  - Continue digoxin 0.125mg  daily. Dig level  0.5 in March 2018.  - BMET today. Watch K - was 5.3 last time.  2. HTN - Well controlled on current regimen.   3. DMII:  - Follows with PCP.  4. Acute on chronic kidney disease: -BMET today.  5. Frequent PVCs - 5.8% PVC's - Increase Coreg as above.   Follow up next month with an Echo.   Arbutus Leas NP-C  10:35 AM

## 2016-07-11 NOTE — Progress Notes (Signed)
Advanced Heart Failure Medication Review by a Pharmacist  Does the patient  feel that his/her medications are working for him/her?  yes  Has the patient been experiencing any side effects to the medications prescribed?  no  Does the patient measure his/her own blood pressure or blood glucose at home?  yes   Does the patient have any problems obtaining medications due to transportation or finances?   Yes - No insurance, [employer] patient assistance for [employer] of regimen: fair Understanding of indications: fair Potential of compliance: fair Patient understands to avoid NSAIDs. Patient understands to avoid decongestants.  Issues to address at subsequent visits: Entresto delivery from [employer]   Pharmacist comments: Ms. Sherburne is a pleasant 72 yo F presenting with her daughter and her medication bottles. She reports fair compliance with her regimen but has only been taking Entresto once a day for the last week since [employer] made a mistake and sent oral chemo to her instead of her Entresto. I will provide her with samples for today until she is able to get her correct shipment from [employer].   Ruta Hinds. Velva Harman, PharmD, BCPS, CPP Clinical Pharmacist Pager: [phone] Phone: [phone] 07/11/2016 10:39 AM      Time with patient: 10 minutes Preparation and documentation time: 4 minutes Total time: 14 minutes

## 2016-07-12 NOTE — Addendum Note (Signed)
Encounter addended by: Arbutus Leas, NP on: 07/12/2016  1:15 PM<BR>    Actions taken: Sign clinical note

## 2016-07-13 ENCOUNTER — Telehealth (HOSPITAL_COMMUNITY): Payer: Self-pay | Admitting: Cardiology

## 2016-07-13 NOTE — Telephone Encounter (Signed)
-----   Message from Arbutus Leas, NP sent at 07/12/2016  1:15 PM EDT ----- Regarding: One month follow up with Echo  After talking with Amy today, I realized that I need to see Ms. Brogden back in a month with an Echo. Will you please call her and schedule?  Tell her that I looked over her chart and realized her last echo was in March and I need one in June as this would make 3 months. Thank you!   Erin.

## 2016-07-18 ENCOUNTER — Telehealth (HOSPITAL_COMMUNITY): Payer: Self-pay | Admitting: Pharmacist

## 2016-07-18 NOTE — Telephone Encounter (Signed)
Ms. Kiel called stating that she had still not heard from [employer] about her Entresto. I have called and spoken with a PharmD who clarified the Rx (Entresto 24-26 mg BID) and stated that they should be able to ship the medication within the next few days. Relayed this info to Ms. Grey who will call me back if she doesn't hear from them within this time.   Ruta Hinds. Velva Harman, PharmD, BCPS, CPP Clinical Pharmacist Pager: [phone] Phone: [phone] 07/18/2016 1:50 PM

## 2016-07-19 NOTE — Telephone Encounter (Signed)
Detailed message left on VM,   Will forward to scheduling for appts as well

## 2016-07-21 NOTE — Telephone Encounter (Signed)
Echo and follow up scheduled 7/3

## 2016-08-22 ENCOUNTER — Other Ambulatory Visit (HOSPITAL_COMMUNITY): Payer: Self-pay

## 2016-08-22 ENCOUNTER — Encounter (HOSPITAL_COMMUNITY): Payer: Self-pay

## 2016-10-11 ENCOUNTER — Ambulatory Visit (HOSPITAL_COMMUNITY)
Admission: RE | Admit: 2016-10-11 | Discharge: 2016-10-11 | Disposition: A | Discharge status: Home / Self Care | Payer: Self-pay | Source: Ambulatory Visit | Attending: Cardiology | Admitting: Cardiology

## 2016-10-11 VITALS — BP 130/72 | HR 71 | Wt 160.2 lb

## 2016-10-11 DIAGNOSIS — N182 Chronic kidney disease, stage 2 (mild): Secondary | ICD-10-CM | POA: Insufficient documentation

## 2016-10-11 DIAGNOSIS — I428 Other cardiomyopathies: Secondary | ICD-10-CM | POA: Insufficient documentation

## 2016-10-11 DIAGNOSIS — N183 Chronic kidney disease, stage 3 unspecified: Secondary | ICD-10-CM

## 2016-10-11 DIAGNOSIS — I130 Hypertensive heart and chronic kidney disease with heart failure and stage 1 through stage 4 chronic kidney disease, or unspecified chronic kidney disease: Secondary | ICD-10-CM | POA: Insufficient documentation

## 2016-10-11 DIAGNOSIS — I5042 Chronic combined systolic (congestive) and diastolic (congestive) heart failure: Secondary | ICD-10-CM

## 2016-10-11 DIAGNOSIS — Z7982 Long term (current) use of aspirin: Secondary | ICD-10-CM | POA: Insufficient documentation

## 2016-10-11 DIAGNOSIS — I493 Ventricular premature depolarization: Secondary | ICD-10-CM | POA: Insufficient documentation

## 2016-10-11 DIAGNOSIS — I2510 Atherosclerotic heart disease of native coronary artery without angina pectoris: Secondary | ICD-10-CM | POA: Insufficient documentation

## 2016-10-11 DIAGNOSIS — E119 Type 2 diabetes mellitus without complications: Secondary | ICD-10-CM

## 2016-10-11 DIAGNOSIS — Z794 Long term (current) use of insulin: Secondary | ICD-10-CM | POA: Insufficient documentation

## 2016-10-11 DIAGNOSIS — Z79899 Other long term (current) drug therapy: Secondary | ICD-10-CM | POA: Insufficient documentation

## 2016-10-11 DIAGNOSIS — J449 Chronic obstructive pulmonary disease, unspecified: Secondary | ICD-10-CM | POA: Insufficient documentation

## 2016-10-11 DIAGNOSIS — I5022 Chronic systolic (congestive) heart failure: Secondary | ICD-10-CM | POA: Insufficient documentation

## 2016-10-11 DIAGNOSIS — Z87891 Personal history of nicotine dependence: Secondary | ICD-10-CM | POA: Insufficient documentation

## 2016-10-11 DIAGNOSIS — E1122 Type 2 diabetes mellitus with diabetic chronic kidney disease: Secondary | ICD-10-CM | POA: Insufficient documentation

## 2016-10-11 DIAGNOSIS — E118 Type 2 diabetes mellitus with unspecified complications: Secondary | ICD-10-CM

## 2016-10-11 DIAGNOSIS — I10 Essential (primary) hypertension: Secondary | ICD-10-CM

## 2016-10-11 LAB — BASIC METABOLIC PANEL
ANION GAP: 7 (ref 5–15)
BUN: 28 mg/dL — AB (ref 6–20)
CHLORIDE: 97 mmol/L — AB (ref 101–111)
CO2: 30 mmol/L (ref 22–32)
Calcium: 9.5 mg/dL (ref 8.9–10.3)
Creatinine, Ser: 1.18 mg/dL — ABNORMAL HIGH (ref 0.44–1.00)
GFR calc Af Amer: 52 mL/min — ABNORMAL LOW (ref >60)
GFR calc non Af Amer: 45 mL/min — ABNORMAL LOW (ref >60)
GLUCOSE: 408 mg/dL — AB (ref 65–99)
POTASSIUM: 5 mmol/L (ref 3.5–5.1)
Sodium: 134 mmol/L — ABNORMAL LOW (ref 135–145)

## 2016-10-11 LAB — HEMOGLOBIN A1C
Hgb A1c MFr Bld: 10.9 % — ABNORMAL HIGH (ref 4.8–5.6)
MEAN PLASMA GLUCOSE: 266.13 mg/dL

## 2016-10-11 LAB — DIGOXIN LEVEL: Digoxin Level: 1.8 ng/mL (ref 0.8–2.0)

## 2016-10-11 LAB — MAGNESIUM: MAGNESIUM: 2.3 mg/dL (ref 1.7–2.4)

## 2016-10-11 MED ORDER — SACUBITRIL-VALSARTAN 49-51 MG PO TABS: 1.0000 | ORAL_TABLET | Freq: Two times a day (BID) | ORAL | 3 refills | Status: DC

## 2016-10-11 MED ORDER — SACUBITRIL-VALSARTAN 49-51 MG PO TABS: 1.0000 | ORAL_TABLET | Freq: Two times a day (BID) | ORAL | 6 refills | Status: DC

## 2016-10-11 NOTE — Patient Instructions (Addendum)
Routine lab work today. Will notify you of abnormal results, otherwise no news is good news!  Will schedule you for an echocardiogram and lab work at [facility]. Address: [address]), [place], [place] [address]  Phone: [phone]  INCREASE Entresto to 49-51 mg tablet twice daily. Rx has been sent through [employer] Patient Assistance. They will call you to verify shipment information to mail to your home.  Follow up 2 months with Dr. Haroldine Laws.  Take all medication as prescribed the day of your appointment. Bring all medications with you to your appointment.  Do the following things EVERYDAY: 1) Weigh yourself in the morning before breakfast. Write it down and keep it in a log. 2) Take your medicines as prescribed 3) Eat low salt foods-Limit salt (sodium) to 2000 mg per day.  4) Stay as active as you can everyday 5) Limit all fluids for the day to less than 2 liters

## 2016-10-11 NOTE — Progress Notes (Signed)
Advanced Heart Failure Medication Review by a Pharmacist  Does the patient  feel that his/her medications are working for him/her?  yes  Has the patient been experiencing any side effects to the medications prescribed?  yes  Does the patient measure his/her own blood pressure or blood glucose at home?  yes   Does the patient have any problems obtaining medications due to transportation or finances?   no  Understanding of regimen: good Understanding of indications: good Potential of compliance: good Patient understands to avoid NSAIDs. Patient understands to avoid decongestants.  Issues to address at subsequent visits: None   Pharmacist comments: Ms. Currin is a pleasant 71 yo F presenting with her daughter who helps manage her medications. She reports great compliance with her regimen. Her only concern is that she has been having some bowel incontinence recently. Will ask provider to check magnesium and digoxin level. No other medication-related questions or concerns for me at this time.   Ruta Hinds. Velva Harman, PharmD, BCPS, CPP Clinical Pharmacist Pager: [phone] Phone: [phone] 10/11/2016 9:45 AM      Time with patient: 12 minutes Preparation and documentation time: 2 minutes Total time: 14 minutes

## 2016-10-11 NOTE — Progress Notes (Signed)
PCP: Dr Azucena Freed  Primary HF Cardiologist: Dr Haroldine Laws  HPI: [name] is a very pleasant  71 y.o. female with chronic combined CHF, poorly controlled DM2, HTN, remote history of Breast CA s/p lumpectomy and radiation as well as chemo (received tamoxifen), Tobacco abuse, and remote DVT.   Admitted 04/4740 with A/C Systolic HF. Echo at that time with EF 40-45%. Cath during that admission with mild to moderate, but non-obstructive CAD including 15% distal LAD stenosis, 30% ulcerated mid LCx disease, and 40% mid RCA stenosis.   Admitted 05/05/16 through 5/95/63 with A/C Systolic Heart Failure. ECHO showed EF was down to 20%. RHC completed with well compensated filling pressures. Concern that PVCs may be a reason for low EF or possible Takotsubo. Also concerned about possible low output so bb was held.  Discharge weight was 162 pounds.   She presents today for regular follow up. Main complaint is diarrhea that resolve with switch to prescription magnesium to OTC. Breathing has been OK, but still gets winded. Parked in front of the ED and was SOB walking in. More SOB up stairs or an incline. Watching fluid and salt intake.  Weight at home stable around 154-158. She says her diet is very poor, diabetes wise. High carbs and sugar. She has not followed up with her PCP in "sometime" as she is dissatisfied with their care. She has started having numbness and tingling in her LEs over the past few months. Sugars as high as 300 several times a week.   Cardiac Studies  ECHO 3/16/208 EF 20% Garde II DD ECHO 11/2015 EF 40-45% Grade IIDD  CMRI 07/07/2008 -  1.  Moderate LV systolic dysfunction with EF 38%.  The inferior and inferolateral basilar wall appear segmentally worse.  2.  Very small area of mid-wall delayed enhancement in the inferolateral base. This is nonspecific and can be seen with infiltrative diseases such as sarcoid as well as with myocarditis.  RHC 05/08/2016  RA = 3 RV = 34/5 PA =  38/12 (21) PCW = 12 Fick cardiac output/index = 4.6/2.6 PVR = 1.9 WU Ao sat = 96% PA sat = 58%, 62% Assessment: 1. Well compensated filling pressures and cardiac output  LHC 12/20/2015 1. Mild to moderate, nonobstructive coronary artery disease, including 15% distal LAD stenosis, 30% ulcerated mid LCx disease, and 40% mid RCA stenosis. 2. Upper normal left ventricular filling pressure (LVEDP 15 mmHg).  Review of systems complete and found to be negative unless listed in HPI.    SH:  Social History   Social History  . Marital status: Widowed    Spouse name: N/A  . Number of children: N/A  . Years of education: N/A   Occupational History  . Not on file.   Social History Main Topics  . Smoking status: Former Smoker    Years: 30.00    Quit date: 09/29/2012  . Smokeless tobacco: Never Used  . Alcohol use No  . Drug use: No  . Sexual activity: Not on file   Other Topics Concern  . Not on file   Social History Narrative  . No narrative on file    FH:  Family History  Problem Relation Age of Onset  . Stroke Father     Past Medical History:  Diagnosis Date  . Breast cancer ([place])    breast - left   . CAD (coronary artery disease)    a. nonobs by LHC in 2010 // b. Myoview 10/17: Lg  infarct apex, distal ant and lat walls, no ischemia, EF 38; int risk  // c. LHC 10/17:  dLAD 15, pLCx 30, mRCA 40, LVEDP 15  . Chronic combined systolic and diastolic CHF (congestive heart failure) ([place])    a. cMRI 5/10: EF 38% // b. Echo 3/13/: EF 50-55, Gr 1 DD // c. Echo 8/14: EF 40, inf/inf-septal HK, Gr 1 DD, mild MR // d. Echo 5/17: EF 40-45, inf HK, Gr 1 DD, mild MR, mild LAE, reduced RVSF, mild TR, PASP 33 // e. Echo 10/17: EF 40-45, Gr 2 DD, mild MR, severe LAE  . COPD (chronic obstructive pulmonary disease) ([place])   . Diabetes mellitus   . Dyspnea   . NICM (nonischemic cardiomyopathy) ([place])     Current Outpatient Prescriptions  Medication Sig Dispense Refill  . aspirin 81 MG  chewable tablet Chew 1 tablet (81 mg total) by mouth daily.    Marland Kitchen atorvastatin (LIPITOR) 40 MG tablet Take 1 tablet (40 mg total) by mouth daily. 30 tablet 6  . carvedilol (COREG) 6.25 MG tablet Take 1 tablet (6.25 mg total) by mouth 2 (two) times daily. 60 tablet 6  . digoxin (LANOXIN) 0.125 MG tablet Take 1 tablet (0.125 mg total) by mouth daily. 30 tablet 6  . furosemide (LASIX) 40 MG tablet Take 1 tablet (40 mg total) by mouth 2 (two) times daily. 60 tablet 6  . insulin NPH-regular Human (NOVOLIN 70/30) (70-30) 100 UNIT/ML injection Please start using 25 units in am and 15 units at bedtime. Check CBG's three times a day (including fasting in am) 10 mL 2  . magnesium oxide (MAG-OX) 400 (241.3 Mg) MG tablet Take 1 tablet (400 mg total) by mouth 2 (two) times daily. 60 tablet 3  . sacubitril-valsartan (ENTRESTO) 24-26 MG Take 1 tablet by mouth 2 (two) times daily. 60 tablet 11  . spironolactone (ALDACTONE) 25 MG tablet Take 1 tablet (25 mg total) by mouth at bedtime. 30 tablet 6   No current facility-administered medications for this encounter.     Vitals:   10/11/16 0920  BP: 130/72  Pulse: 71  SpO2: 98%  Weight: 160 lb 3.2 oz (72.7 kg)   Wt Readings from Last 3 Encounters:  10/11/16 160 lb 3.2 oz (72.7 kg)  07/11/16 160 lb 3.2 oz (72.7 kg)  05/30/16 160 lb 6.4 oz (72.8 kg)    PHYSICAL EXAM: General: Well appearing. No resp difficulty. Daughter present.  HEENT: Normal Neck: Supple. JVP 6-7 cm. Carotids 2+ bilat; no bruits. No thyromegaly or nodule noted. Cor: PMI nondisplaced. RRR, No M/G/R noted Lungs: CTAB, normal effort. Abdomen: Soft, non-tender, non-distended, no HSM. No bruits or masses. +BS  Extremities: No cyanosis, clubbing, or rash. No peripheral edema.  Neuro: Alert & orientedx3, cranial nerves grossly intact. moves all 4 extremities w/o difficulty. Affect pleasant   ASSESSMENT & PLAN: 1. Chronic systolic CHF: NICM. Left heart cath 10/17 with no CAD. Had CMRI 2010   LVEF 38% and question about possible sarcoid. Look back at [facility] EF is down from 40% in 2017 to 20% in 2018. 04/2016 LVEF 20%, Grade 2 DD. Mild MR, Mild LAE, RV mildly reduced, PA peak pressure 21 mm Hg.  - She missed Echo appointment due to miscommunication. Will re-order.  - Had [place] 04/2016 well compesated cardiac output.  - NYHA class III symptoms.   - Increase Entresto to 49/51 mg BID. BMET today and repeat 10-14 days.  - Continue coreg 6.25 mg BID.   -  Continue Spiro 25mg  daily  - Continue Lasix 40mg  BID. BMET and Mg today.  - Continue digoxin 0.125mg  daily. Dig level today.  - Reinforced fluid restriction to < 2 L daily, sodium restriction to less than 2000 mg daily, and the importance of daily weights.   2. HTN - Meds as above.  3. DMII:  - + Paraesthesias. Hgb A1c today. Needs close PCP follow up.  4. Chronic kidney disease II-III - BMET today.  5. Frequent PVCs - 5.8% PVC on last holter.  - Coreg as above. Denies palpitations.   Satira Mccallum Nilsa Macht, PA-C 9:30 AM  Greater than 50% of the 25 minute visit was spent in counseling/coordination of care regarding disease state education, sliding scale diuretics, salt/fluid restriction, and medication reconciliation.

## 2016-10-17 ENCOUNTER — Encounter (HOSPITAL_COMMUNITY): Payer: Self-pay | Admitting: Cardiology

## 2016-10-21 HISTORY — PX: TRANSTHORACIC ECHOCARDIOGRAM: SHX275

## 2016-10-24 ENCOUNTER — Other Ambulatory Visit: Payer: Self-pay

## 2016-10-24 ENCOUNTER — Other Ambulatory Visit: Payer: Self-pay | Admitting: *Deleted

## 2016-10-24 ENCOUNTER — Ambulatory Visit (HOSPITAL_COMMUNITY): Payer: Self-pay | Attending: Cardiology

## 2016-10-24 DIAGNOSIS — I130 Hypertensive heart and chronic kidney disease with heart failure and stage 1 through stage 4 chronic kidney disease, or unspecified chronic kidney disease: Secondary | ICD-10-CM | POA: Insufficient documentation

## 2016-10-24 DIAGNOSIS — I10 Essential (primary) hypertension: Secondary | ICD-10-CM

## 2016-10-24 DIAGNOSIS — Z720 Tobacco use: Secondary | ICD-10-CM | POA: Insufficient documentation

## 2016-10-24 DIAGNOSIS — N189 Chronic kidney disease, unspecified: Secondary | ICD-10-CM | POA: Insufficient documentation

## 2016-10-24 DIAGNOSIS — Z853 Personal history of malignant neoplasm of breast: Secondary | ICD-10-CM | POA: Insufficient documentation

## 2016-10-24 DIAGNOSIS — J449 Chronic obstructive pulmonary disease, unspecified: Secondary | ICD-10-CM | POA: Insufficient documentation

## 2016-10-24 DIAGNOSIS — E1122 Type 2 diabetes mellitus with diabetic chronic kidney disease: Secondary | ICD-10-CM | POA: Insufficient documentation

## 2016-10-24 DIAGNOSIS — I5022 Chronic systolic (congestive) heart failure: Secondary | ICD-10-CM | POA: Insufficient documentation

## 2016-10-24 DIAGNOSIS — I2510 Atherosclerotic heart disease of native coronary artery without angina pectoris: Secondary | ICD-10-CM | POA: Insufficient documentation

## 2016-10-24 LAB — BASIC METABOLIC PANEL
BUN/Creatinine Ratio: 25 (ref 12–28)
BUN: 36 mg/dL — AB (ref 8–27)
CALCIUM: 9.6 mg/dL (ref 8.7–10.3)
CHLORIDE: 97 mmol/L (ref 96–106)
CO2: 24 mmol/L (ref 20–29)
Creatinine, Ser: 1.43 mg/dL — ABNORMAL HIGH (ref 0.57–1.00)
GFR calc Af Amer: 42 mL/min/{1.73_m2} — ABNORMAL LOW (ref >59)
GFR calc non Af Amer: 37 mL/min/{1.73_m2} — ABNORMAL LOW (ref >59)
GLUCOSE: 156 mg/dL — AB (ref 65–99)
Potassium: 4.9 mmol/L (ref 3.5–5.2)
Sodium: 140 mmol/L (ref 134–144)

## 2016-10-24 NOTE — Addendum Note (Signed)
Addended by: Eulis Foster on: 10/24/2016 09:03 AM   Modules accepted: Orders

## 2016-10-25 ENCOUNTER — Telehealth (HOSPITAL_COMMUNITY): Payer: Self-pay

## 2016-10-25 NOTE — Telephone Encounter (Signed)
ECHOCARDIOGRAM COMPLETE  Order: [id number]  Status:  Final result Visible to patient:  Yes (MyChart) Dx:  Chronic systolic heart failure ([place])  Notes recorded by Effie Berkshire, RN on 10/25/2016 at 3:30 PM EDT Attempted to call patient, person answered and hung up without greeting. Attempted to call back, no answer. ------  Notes recorded by Shirley Friar, PA-C on 10/24/2016 at 11:51 AM EDT Her EF has returned to normal.   Please make sure she has had PCP follow up for her DM. We were unable to reach by phone initially.    Legrand Como [address]" [place], PA-C 10/24/2016 11:51 AM

## 2016-10-30 NOTE — Telephone Encounter (Signed)
ECHOCARDIOGRAM COMPLETE  Order: [id number]  Status:  Final result Visible to patient:  Yes (MyChart) Dx:  Chronic systolic heart failure ([place])  Notes recorded by Effie Berkshire, RN on 10/30/2016 at 9:43 AM EDT Detailed VM left on patient's confidential VM of stable echo and apt reminder. Left call back number for any further questions/concerns or if needing to reschedule f/u apt. ------  Notes recorded by Effie Berkshire, RN on 10/25/2016 at 3:30 PM EDT Attempted to call patient, person answered and hung up without greeting. Attempted to call back, no answer. ------  Notes recorded by Shirley Friar, PA-C on 10/24/2016 at 11:51 AM EDT Her EF has returned to normal.   Please make sure she has had PCP follow up for her DM. We were unable to reach by phone initially.    Legrand Como [address]" [place], PA-C 10/24/2016 11:51 AM

## 2016-12-11 ENCOUNTER — Encounter (HOSPITAL_COMMUNITY): Payer: Self-pay | Admitting: Internal Medicine

## 2016-12-11 ENCOUNTER — Ambulatory Visit (HOSPITAL_COMMUNITY)
Admission: RE | Admit: 2016-12-11 | Discharge: 2016-12-11 | Disposition: A | Discharge status: Home / Self Care | Payer: Self-pay | Source: Ambulatory Visit | Attending: Internal Medicine | Admitting: Internal Medicine

## 2016-12-11 VITALS — BP 132/70 | HR 68 | Wt 153.1 lb

## 2016-12-11 DIAGNOSIS — Z7982 Long term (current) use of aspirin: Secondary | ICD-10-CM | POA: Insufficient documentation

## 2016-12-11 DIAGNOSIS — Z823 Family history of stroke: Secondary | ICD-10-CM | POA: Insufficient documentation

## 2016-12-11 DIAGNOSIS — Z86718 Personal history of other venous thrombosis and embolism: Secondary | ICD-10-CM | POA: Insufficient documentation

## 2016-12-11 DIAGNOSIS — I2510 Atherosclerotic heart disease of native coronary artery without angina pectoris: Secondary | ICD-10-CM

## 2016-12-11 DIAGNOSIS — I5022 Chronic systolic (congestive) heart failure: Secondary | ICD-10-CM

## 2016-12-11 DIAGNOSIS — J449 Chronic obstructive pulmonary disease, unspecified: Secondary | ICD-10-CM | POA: Insufficient documentation

## 2016-12-11 DIAGNOSIS — R5383 Other fatigue: Secondary | ICD-10-CM | POA: Insufficient documentation

## 2016-12-11 DIAGNOSIS — Z923 Personal history of irradiation: Secondary | ICD-10-CM | POA: Insufficient documentation

## 2016-12-11 DIAGNOSIS — Z794 Long term (current) use of insulin: Secondary | ICD-10-CM | POA: Insufficient documentation

## 2016-12-11 DIAGNOSIS — Z9221 Personal history of antineoplastic chemotherapy: Secondary | ICD-10-CM | POA: Insufficient documentation

## 2016-12-11 DIAGNOSIS — I428 Other cardiomyopathies: Secondary | ICD-10-CM

## 2016-12-11 DIAGNOSIS — E1122 Type 2 diabetes mellitus with diabetic chronic kidney disease: Secondary | ICD-10-CM | POA: Insufficient documentation

## 2016-12-11 DIAGNOSIS — I493 Ventricular premature depolarization: Secondary | ICD-10-CM | POA: Insufficient documentation

## 2016-12-11 DIAGNOSIS — Z79899 Other long term (current) drug therapy: Secondary | ICD-10-CM | POA: Insufficient documentation

## 2016-12-11 DIAGNOSIS — R400 Somnolence: Secondary | ICD-10-CM

## 2016-12-11 DIAGNOSIS — Z853 Personal history of malignant neoplasm of breast: Secondary | ICD-10-CM | POA: Insufficient documentation

## 2016-12-11 DIAGNOSIS — I130 Hypertensive heart and chronic kidney disease with heart failure and stage 1 through stage 4 chronic kidney disease, or unspecified chronic kidney disease: Secondary | ICD-10-CM | POA: Insufficient documentation

## 2016-12-11 DIAGNOSIS — N182 Chronic kidney disease, stage 2 (mild): Secondary | ICD-10-CM | POA: Insufficient documentation

## 2016-12-11 DIAGNOSIS — Z87891 Personal history of nicotine dependence: Secondary | ICD-10-CM | POA: Insufficient documentation

## 2016-12-11 LAB — BASIC METABOLIC PANEL
ANION GAP: 7 (ref 5–15)
BUN: 24 mg/dL — ABNORMAL HIGH (ref 6–20)
CHLORIDE: 100 mmol/L — AB (ref 101–111)
CO2: 30 mmol/L (ref 22–32)
Calcium: 9.4 mg/dL (ref 8.9–10.3)
Creatinine, Ser: 1.27 mg/dL — ABNORMAL HIGH (ref 0.44–1.00)
GFR calc Af Amer: 48 mL/min — ABNORMAL LOW (ref >60)
GFR, EST NON AFRICAN AMERICAN: 41 mL/min — AB (ref >60)
Glucose, Bld: 324 mg/dL — ABNORMAL HIGH (ref 65–99)
Potassium: 4.5 mmol/L (ref 3.5–5.1)
SODIUM: 137 mmol/L (ref 135–145)

## 2016-12-11 LAB — MAGNESIUM: MAGNESIUM: 2 mg/dL (ref 1.7–2.4)

## 2016-12-11 MED ORDER — SPIRONOLACTONE 25 MG PO TABS: 12.5000 mg | ORAL_TABLET | Freq: Every day | ORAL | 6 refills | Status: DC

## 2016-12-11 MED ORDER — CARVEDILOL 6.25 MG PO TABS: 6.2500 mg | ORAL_TABLET | Freq: Two times a day (BID) | ORAL | 6 refills | Status: DC

## 2016-12-11 MED ORDER — FUROSEMIDE 40 MG PO TABS: 40.0000 mg | ORAL_TABLET | Freq: Two times a day (BID) | ORAL | 6 refills | Status: DC

## 2016-12-11 MED ORDER — ATORVASTATIN CALCIUM 40 MG PO TABS: 40.0000 mg | ORAL_TABLET | Freq: Every day | ORAL | 6 refills | Status: DC

## 2016-12-11 MED ORDER — SACUBITRIL-VALSARTAN 49-51 MG PO TABS: 1.0000 | ORAL_TABLET | Freq: Two times a day (BID) | ORAL | 3 refills | Status: DC

## 2016-12-11 MED ORDER — SACUBITRIL-VALSARTAN 49-51 MG PO TABS: 1.0000 | ORAL_TABLET | Freq: Two times a day (BID) | ORAL | 6 refills | Status: DC

## 2016-12-11 NOTE — Patient Instructions (Signed)
Stop Digoxin  Restart Spironolactone 12.5 (1/2 tab) daily  Labs today  Your physician has recommended that you have a sleep study. This test records several body functions during sleep, including: brain activity, eye movement, oxygen and carbon dioxide blood levels, heart rate and rhythm, breathing rate and rhythm, the flow of air through your mouth and nose, snoring, body muscle movements, and chest and belly movement.  Your physician recommends that you schedule a follow-up appointment in: 3 months with Dr Skeet Latch at [facility] located at  [place], [place], [place] [address]

## 2016-12-11 NOTE — Addendum Note (Signed)
Encounter addended by: Scarlette Calico, RN on: 12/11/2016  2:22 PM<BR>    Actions taken: Pharmacy for encounter modified, Order list changed

## 2016-12-11 NOTE — Progress Notes (Signed)
PCP: Dr Azucena Freed  Primary HF Cardiologist: Dr Haroldine Laws  HPI: [name] is a very pleasant  71 y.o. female with chronic combined CHF, poorly controlled DM2, HTN, remote history of Breast CA s/p lumpectomy and radiation as well as chemo (received tamoxifen), Tobacco abuse, and remote DVT.   Admitted 25/9563 with A/C Systolic HF. Echo at that time with EF 40-45%. Cath during that admission with mild to moderate, but non-obstructive CAD including 15% distal LAD stenosis, 30% ulcerated mid LCx disease, and 40% mid RCA stenosis.   Admitted 05/05/16 through 8/75/64 with A/C Systolic Heart Failure. ECHO showed EF was down to 20%. RHC completed with well compensated filling pressures. Concern that PVCs may be a reason for low EF or possible Takotsubo. Also concerned about possible low output so bb was held.  Discharge weight was 162 pounds.   Today she returns for HF follow up. Overall feeling ok. Complaining of fatigue. Day time fatigue. She has not had sleep study. Denies SOB/PND/Orthopnea. Weight at 149 pounds. Not taking spiro but taking all other medications.   Cardiac Studies  ECHO 10/24/2016 EF 55-60%  ECHO 3/16/208 EF 20% Gard[place] II DD ECHO 11/2015 EF 40-45% Grade IIDD  CMRI 07/07/2008 -  1.  Moderate LV systolic dysfunction with EF 38%.  The inferior and inferolateral basilar wall appear segmentally worse.  2.  Very small area of mid-wall delayed enhancement in the inferolateral base. This is nonspecific and can be seen with infiltrative diseases such as sarcoid as well as with myocarditis.  RHC 05/08/2016  RA = 3 RV = 34/5 PA = 38/12 (21) PCW = 12 Fick cardiac output/index = 4.6/2.6 PVR = 1.9 WU Ao sat = 96% PA sat = 58%, 62% Assessment: 1. Well compensated filling pressures and cardiac output  LHC 12/20/2015 1. Mild to moderate, nonobstructive coronary artery disease, including 15% distal LAD stenosis, 30% ulcerated mid LCx disease, and 40% mid RCA stenosis. 2. Upper  normal left ventricular filling pressure (LVEDP 15 mmHg).  Review of systems complete and found to be negative unless listed in HPI.    SH:  Social History   Social History  . Marital status: Widowed    Spouse name: N/A  . Number of children: N/A  . Years of education: N/A   Occupational History  . Not on file.   Social History Main Topics  . Smoking status: Former Smoker    Years: 30.00    Quit date: 09/29/2012  . Smokeless tobacco: Never Used  . Alcohol use No  . Drug use: No  . Sexual activity: Not on file   Other Topics Concern  . Not on file   Social History Narrative  . No narrative on file    FH:  Family History  Problem Relation Age of Onset  . Stroke Father     Past Medical History:  Diagnosis Date  . Breast cancer ([place])    breast - left   . CAD (coronary artery disease)    a. nonobs by LHC in 2010 // b. Myoview 10/17: Lg infarct apex, distal ant and lat walls, no ischemia, EF 38; int risk  // c. LHC 10/17:  dLAD 15, pLCx 30, mRCA 40, LVEDP 15  . Chronic combined systolic and diastolic CHF (congestive heart failure) ([place])    a. cMRI 5/10: EF 38% // b. Echo 3/13/: EF 50-55, Gr 1 DD // c. Echo 8/14: EF 40, inf/inf-septal HK, Gr 1 DD, mild MR // d.  Echo 5/17: EF 40-45, inf HK, Gr 1 DD, mild MR, mild LAE, reduced RVSF, mild TR, PASP 33 // e. Echo 10/17: EF 40-45, Gr 2 DD, mild MR, severe LAE  . COPD (chronic obstructive pulmonary disease) ([place])   . Diabetes mellitus   . Dyspnea   . NICM (nonischemic cardiomyopathy) ([place])     Current Outpatient Prescriptions  Medication Sig Dispense Refill  . aspirin 81 MG chewable tablet Chew 1 tablet (81 mg total) by mouth daily.    Marland Kitchen atorvastatin (LIPITOR) 40 MG tablet Take 1 tablet (40 mg total) by mouth daily. 30 tablet 6  . carvedilol (COREG) 6.25 MG tablet Take 1 tablet (6.25 mg total) by mouth 2 (two) times daily. 60 tablet 6  . digoxin (LANOXIN) 0.125 MG tablet Take 1 tablet (0.125 mg total) by mouth daily. 30  tablet 6  . furosemide (LASIX) 40 MG tablet Take 1 tablet (40 mg total) by mouth 2 (two) times daily. 60 tablet 6  . insulin NPH-regular Human (NOVOLIN 70/30) (70-30) 100 UNIT/ML injection Please start using 25 units in am and 15 units at bedtime. Check CBG's three times a day (including fasting in am) 10 mL 2  . magnesium oxide (MAG-OX) 400 (241.3 Mg) MG tablet Take 1 tablet (400 mg total) by mouth 2 (two) times daily. (Patient taking differently: Take 400 mg by mouth daily. ) 60 tablet 3  . sacubitril-valsartan (ENTRESTO) 49-51 MG Take 1 tablet by mouth 2 (two) times daily. 180 tablet 3  . spironolactone (ALDACTONE) 25 MG tablet Take 1 tablet (25 mg total) by mouth at bedtime. (Patient not taking: Reported on 12/11/2016) 30 tablet 6   No current facility-administered medications for this encounter.     Vitals:   12/11/16 0910  BP: 132/70  Pulse: 68  SpO2: 95%  Weight: 153 lb 1.9 oz (69.5 kg)   Wt Readings from Last 3 Encounters:  12/11/16 153 lb 1.9 oz (69.5 kg)  10/11/16 160 lb 3.2 oz (72.7 kg)  07/11/16 160 lb 3.2 oz (72.7 kg)    PHYSICAL EXAM: General:  Well appearing. No resp difficulty. Daughter present.  HEENT: normal. Anicteric  Neck: supple. no JVD. Carotids 2+ bilat; no bruits. No lymphadenopathy or thryomegaly appreciated. Cor: PMI nondisplaced. Regular rate & rhythm. No rubs, gallops or murmurs. Lungs: clear Abdomen: soft, nontender, nondistended. No hepatosplenomegaly. No bruits or masses. Good bowel sounds. Extremities: no cyanosis, clubbing, rash, edema. warm Neuro: alert & orientedx3, cranial nerves grossly intact. moves all 4 extremities w/o difficulty. Affect pleasant  ASSESSMENT & PLAN: 1. Chronic systolic CHF: NICM. Left heart cath 10/17 with no CAD. Had CMRI 2010  LVEF 38% and question about possible sarcoid. Look back at [facility] EF is down from 40% in 2017 to 20% in 2018. 04/2016 LVEF 20%, Grade 2 DD. Mild MR, Mild LAE, RV mildly reduced, PA peak pressure 21 mm  Hg.  Marland Kitchen ECHO 10/24/2016 EF 55-60%  - Continue Entresto to 49/51 mg BID. Marland Kitchen  - Continue coreg 6.25 mg BID.   - Restart 12.5 mg spiro.   - Continue Lasix 40mg  BID. - Stop digoxin.  2. HTN - Restart 12.5 mg spiro daily.   3. DMII:  - + Paraesthesias. Hgb A1c today. Needs close PCP follow up.  4. Chronic kidney disease II-III - BMET today.  5. Frequent PVCs - 5.8% PVC on last holter.  Resolved 6. Daytime fatigue- Set up for sleep study  Darrick Grinder, NP 9:30 AM   Patient seen and  examined with Darrick Grinder, NP. We discussed all aspects of the encounter. I agree with the assessment and plan as stated above.   Echo reviewed personally. EF has completely recovered. Volume status looks good. Will restart spiro.   Can refer to [facility] for f/u. Refer for sleep study.   Glori Bickers, MD  10:15 AM

## 2017-04-12 ENCOUNTER — Ambulatory Visit (INDEPENDENT_AMBULATORY_CARE_PROVIDER_SITE_OTHER): Payer: Medicare Other | Admitting: Cardiology

## 2017-04-12 ENCOUNTER — Encounter: Payer: Self-pay | Admitting: Cardiology

## 2017-04-12 VITALS — BP 122/68 | HR 59 | Ht 63.5 in | Wt 155.2 lb

## 2017-04-12 DIAGNOSIS — R002 Palpitations: Secondary | ICD-10-CM | POA: Diagnosis not present

## 2017-04-12 DIAGNOSIS — I10 Essential (primary) hypertension: Secondary | ICD-10-CM | POA: Diagnosis not present

## 2017-04-12 DIAGNOSIS — I428 Other cardiomyopathies: Secondary | ICD-10-CM | POA: Diagnosis not present

## 2017-04-12 DIAGNOSIS — E785 Hyperlipidemia, unspecified: Secondary | ICD-10-CM

## 2017-04-12 DIAGNOSIS — Z01810 Encounter for preprocedural cardiovascular examination: Secondary | ICD-10-CM | POA: Diagnosis not present

## 2017-04-12 DIAGNOSIS — I5042 Chronic combined systolic (congestive) and diastolic (congestive) heart failure: Secondary | ICD-10-CM

## 2017-04-12 NOTE — Patient Instructions (Addendum)
No medication changes  MAY STOP TAKING ASPIRIN  NOW   TRY TO TAKE LASIX ONCE A DAY ,IF SWELLING  IS INCREASE CAN TAKE ANOTHER TABLET DAILY  Sliding scale Lasix: Weigh yourself when you get home, then Daily in the Morning. Your dry weight will be what your scale says on the day you return home.(here is 158 lbs.).   If you gain more than 3 pounds from dry weight: Increase the Lasix dosing to 40 mg in the morning and 40 mg in the afternoon until weight returns to baseline dry weight.  If weight gain is greater than 5 pounds in 2 days: Increased to Lasix 80 mg in the morning and 40 mg in the evening a day and contact the office for further assistance if weight does not go down the next day.  If the weight goes down more than 3 pounds from dry weight: Hold Lasix until it returns to baseline dry weight      OKAY TO HAVE SURGERY  ON  MONDAY  WITH DR Towanda Malkin.  Your physician wants you to follow-up in [place]. You will receive a reminder letter in the mail two months in advance. If you don't receive a letter, please call our office to schedule the follow-up appointment.    If you need a refill on your cardiac medications before your next appointment, please call your pharmacy.

## 2017-04-12 NOTE — Progress Notes (Signed)
PCP: Patient, No Pcp Per  Clinic Note: Chief Complaint  Patient presents with  . Follow-up    Establish primary cardiology after transition from heart failure clinic  . Cardiomyopathy    Resolved    HPI: [name] is a 72 y.o. female with a PMH below who presents today for establishing General Cardiology Care.  [name] has had [place] has had a long-standing history of combined systolic and diastolic heart failure with poorly controlled diabetes, and hypertension as well as history of breast cancer treated with chemotherapy and radiation.  Also tobacco abuse history.   Admitted 27/7824 with A/C Systolic HF. Echo at that time with EF 40-45%. Cath during that admission with mild to moderate, but non-obstructive CAD including 15% distal LAD stenosis, 30% ulcerated mid LCx disease, and 40% mid RCA stenosis.   Admitted 05/05/16 through 2/35/36 with A/C Systolic Heart Failure. ECHO showed EF was down to 20%. RHC completed with well compensated filling pressures. Concern that PVCs may be a reason for low EF or possible Takotsubo. Also concerned about possible low output so bb was held.  Discharge weight was 162 pounds.    [name] was last seen on December 11, 2016 by Dr. Britta Mccreedy on.  Was doing well just complaining of a little bit of fatigue.  Had not yet had a sleep study.  Recent Hospitalizations: None  Studies Personally Reviewed - (if available, images/films reviewed: From Epic Chart or Care Everywhere) ECHO 11/2015 EF 40-45% Grade IIDD  ECHO 05/05/2016 EF 20% grade II DD   Transthoracic Echo September 2018: EF 55-60%.  Normal LV function.  Mild diastolic dysfunction.  Normal valve function  LHC 12/20/2015 : Mild to moderate, nonobstructive coronary artery disease, including 15% distal LAD stenosis, 30% ulcerated mid LCx disease, and 40% mid RCA stenosis.  Upper normal left ventricular filling pressure (LVEDP 15 mmHg).   [place] 05/08/2016: RA = 3, RV = 34/5, PA = 38/12 (21), PCW =  12; Fick cardiac output/index = 4.6/2.6; PVR = 1.9 WU, Ao sat = 96%; PA sat = 58%, 62%  Interval History: [name] presents here today to establish new cardiology care after being transitioned from the heart failure clinic.  She continues to do very well with no active cardiac symptoms.  She is very active with exercising routinely doing cardiovascular exercise such as treadmill walking her dog daily.  She has not had any significant CHF symptoms of PND, orthopnea or edema.  She has never had any chest tightness or pressure with exertion.  No exertional dyspnea.  No palpitations, lightheadedness, dizziness, weakness or syncope/near syncope. No TIA/amaurosis fugax symptoms. No melena, hematochezia, hematuria, or epstaxis. No claudication.  She was having some diarrhea and upset stomach with the full 25 mg spironolactone that has been notably improved since cutting it in half.  She now takes 12.5 mg daily.  She is due to have trigger finger surgery Next week.  I have given her a preop letter clearing for her low risk surgery.  ROS: A comprehensive was performed. Review of Systems  Constitutional: Negative for malaise/fatigue.  HENT: Negative for congestion and nosebleeds.   Eyes:       Right eye cataract  Respiratory: Negative for cough and shortness of breath.   Gastrointestinal: Negative for abdominal pain, blood in stool, heartburn and melena.  Genitourinary: Negative for hematuria.  Musculoskeletal: Negative for back pain and joint pain (Left fourth digit trigger finger).  Psychiatric/Behavioral: Negative for depression and memory loss. The patient  is not nervous/anxious and does not have insomnia.   All other systems reviewed and are negative.  I have reviewed and (if needed) personally updated the patient's problem list, medications, allergies, past medical and surgical history, social and family history.   Past Medical History:  Diagnosis Date  . Breast cancer ([place])    breast - left    . Chronic combined systolic and diastolic CHF (congestive heart failure) ([facility]) -Systolic Dysfunction resolved.    a. cMRI 5/10: EF 38% // b. Echo 3/13/: EF 50-55, Gr 1 DD // c. Echo 8/14: EF 40, inf/inf-septal HK, Gr 1 DD, mild MR // d. Echo 5/17: EF 40-45, inf HK, Gr 1 DD, mild MR, mild LAE, reduced RVSF, mild TR, PASP 33 // e. Echo 10/17: EF 40-45, Gr 2 DD, mild MR, severe LAE  . COPD (chronic obstructive pulmonary disease) ([place])   . Coronary artery disease, non-occlusive    a. nonobs by LHC in 2010 // b. Myoview 10/17: Lg infarct apex, distal ant and lat walls, no ischemia, EF 38; int risk  // c. [place] 10/17:  dLAD 15, pLCx 30, mRCA 40, LVEDP 15  . Diabetes mellitus   . Dyspnea   . NICM (nonischemic cardiomyopathy) [facility])     Past Surgical History:  Procedure Laterality Date  . APPENDECTOMY    . BREAST LUMPECTOMY     lt breast  . CARDIAC CATHETERIZATION N/A 12/20/2015   Procedure: Left Heart Cath and Coronary Angiography;  Surgeon: Nelva Bush, MD;  Location: [facility];  Service: Cardiovascular;  Laterality: N/A;  . RIGHT HEART CATH N/A 05/08/2016   Procedure: Right Heart Cath;  Surgeon: Jolaine Artist, MD;  Location: [facility];  Service: Cardiovascular;  Laterality: N/A;  . TRANSTHORACIC ECHOCARDIOGRAM  11/2015   EF 40 and 45%.  GRII DD  . TRANSTHORACIC ECHOCARDIOGRAM  04/2016   EF 20% with grade 2 diastolic dysfunction  . TRANSTHORACIC ECHOCARDIOGRAM  10/2016   EF 55-60%.  Normal LV function.  Mild diastolic dysfunction.  Normal valve function    Current Meds  Medication Sig  . aspirin 81 MG chewable tablet Chew 1 tablet (81 mg total) by mouth daily.  Marland Kitchen atorvastatin (LIPITOR) 40 MG tablet Take 1 tablet (40 mg total) by mouth daily.  . carvedilol (COREG) 6.25 MG tablet Take 1 tablet (6.25 mg total) by mouth 2 (two) times daily.  . furosemide (LASIX) 40 MG tablet Take 1 tablet (40 mg total) by mouth 2 (two) times daily.  . insulin NPH-regular Human (NOVOLIN  70/30) (70-30) 100 UNIT/ML injection Please start using 25 units in am and 15 units at bedtime. Check CBG's three times a day (including fasting in am)  . magnesium oxide (MAG-OX) 400 (241.3 Mg) MG tablet Take 1 tablet (400 mg total) by mouth 2 (two) times daily. (Patient taking differently: Take 400 mg by mouth daily. )  . sacubitril-valsartan (ENTRESTO) 49-51 MG Take 1 tablet by mouth 2 (two) times daily.  Marland Kitchen spironolactone (ALDACTONE) 25 MG tablet Take 0.5 tablets (12.5 mg total) by mouth at bedtime.    Allergies  Allergen Reactions  . Tape Rash    Pt can tolerate the paper tape    Social History   Tobacco Use  . Smoking status: Former Smoker    Years: 30.00    Last attempt to quit: 09/29/2012    Years since quitting: 4.5  . Smokeless tobacco: Never Used  Substance Use Topics  . Alcohol use: No  .  Drug use: No   Social History   Social History Narrative  . Not on file    family history includes Stroke in her father.  Wt Readings from Last 3 Encounters:  04/12/17 155 lb 3.2 oz (70.4 kg)  12/11/16 153 lb 1.9 oz (69.5 kg)  10/11/16 160 lb 3.2 oz (72.7 kg)  @ home 148 lb.    PHYSICAL EXAM BP 122/68   Pulse (!) 59   Ht 5' 3.5" (1.613 m)   Wt 155 lb 3.2 oz (70.4 kg)   BMI 27.06 kg/m  Physical Exam  Constitutional: She is oriented to person, place, and time. She appears well-developed and well-nourished. No distress.  Healthy-appearing.  Well-groomed  HENT:  Head: Normocephalic and atraumatic.  Mouth/Throat: No oropharyngeal exudate.  Eyes: Conjunctivae and EOM are normal. Pupils are equal, round, and reactive to light.  Neck: Normal range of motion. Neck supple. No hepatojugular reflux and no JVD present. Carotid bruit is not present.  Cardiovascular: Normal rate, regular rhythm and normal heart sounds. PMI is not displaced. Exam reveals no gallop and no friction rub.  No murmur heard. Pulmonary/Chest: Effort normal and breath sounds normal. No respiratory distress.  She has no wheezes. She has no rales.  Abdominal: Soft. Bowel sounds are normal. She exhibits no distension. There is no tenderness.  Musculoskeletal: Normal range of motion. She exhibits no edema.  Neurological: She is alert and oriented to person, place, and time.  Skin: Skin is warm and dry. No erythema.  Psychiatric: She has a normal mood and affect. Her behavior is normal. Judgment and thought content normal.  Nursing note and vitals reviewed.    Adult ECG Report  Rate: 59 ;  Rhythm: sinus bradycardia and Left axis deviation (-32).  Otherwise normal intervals, durations.;   Narrative Interpretation: Relatively normal EKG.   Other studies Reviewed: Additional studies/ records that were reviewed today include:  Recent Labs:    Lab Results  Component Value Date   CREATININE 1.27 (H) 12/11/2016   BUN 24 (H) 12/11/2016   NA 137 12/11/2016   K 4.5 12/11/2016   CL 100 (L) 12/11/2016   CO2 30 12/11/2016   Lab Results  Component Value Date   CHOL 181 12/17/2015   HDL 64 12/17/2015   LDLCALC 88 12/17/2015   TRIG 143 12/17/2015   CHOLHDL 2.8 12/17/2015    ASSESSMENT / PLAN: Problem List Items Addressed This Visit    Chronic combined systolic and diastolic heart failure ([place]) (Chronic)    Now that her echocardiogram shows preserved EF, she no longer has systolic heart failure.  But still has diastolic dysfunction.  Thankfully, and no active symptoms.  She remains on stable dose of carvedilol, Entresto and spironolactone.  Not able to tolerate full dose of spironolactone because of diarrhea. Is on standing dose of 40 mg twice daily Lasix.  At this point I think we can try to reduce to once daily Lasix and using additional 40 mg as needed.  See patient instructions for sliding scale below.      Dyslipidemia, goal to be determined (Chronic)    On atorvastatin.  Do not have recent labs.  However labs back in 2017 look pretty well controlled.  No myalgias.      Hypertension     Center has a history of retention, now well controlled.  No need to adjust medications.      NICM (nonischemic cardiomyopathy) ([place]) - Primary (Chronic)    Essentially almost resolved cardiomyopathy now  with only residual diastolic dysfunction.  However for now would continue with her restorative medications including carvedilol and Entresto along with spironolactone.  We can probably back off on some of the Lasix to once daily dosing.  The additional dose will be as needed.      Relevant Orders   EKG 12-Lead (Completed)   Palpitations    Well-controlled with carvedilol      Relevant Orders   EKG 12-Lead (Completed)   Preop cardiovascular exam    Doing quite well with relatively well-controlled heart failure symptoms.  No active heart failure or angina symptoms.  No arrhythmias noted.  As such no further evaluation is needed prior to low risk hand surgery next week.      Relevant Orders   EKG 12-Lead (Completed)      Current medicines are reviewed at length with the patient today. (+/- concerns) none The following changes have been made:Stop aspirin.  Switch to once daily Lasix with additional dose as needed per sliding scale.  Patient Instructions  No medication changes  MAY STOP TAKING ASPIRIN  NOW   TRY TO TAKE LASIX ONCE A DAY ,IF SWELLING  IS INCREASE CAN TAKE ANOTHER TABLET DAILY  Sliding scale Lasix: Weigh yourself when you get home, then Daily in the Morning. Your dry weight will be what your scale says on the day you return home.(here is 158 lbs.).   If you gain more than 3 pounds from dry weight: Increase the Lasix dosing to 40 mg in the morning and 40 mg in the afternoon until weight returns to baseline dry weight.  If weight gain is greater than 5 pounds in 2 days: Increased to Lasix 80 mg in the morning and 40 mg in the evening a day and contact the office for further assistance if weight does not go down the next day.  If the weight goes down more than 3  pounds from dry weight: Hold Lasix until it returns to baseline dry weight      OKAY TO HAVE SURGERY  ON  MONDAY  WITH DR Towanda Malkin.  Your physician wants you to follow-up in [place]. You will receive a reminder letter in the mail two months in advance. If you don't receive a letter, please call our office to schedule the follow-up appointment.    If you need a refill on your cardiac medications before your next appointment, please call your pharmacy.    Studies Ordered:   Orders Placed This Encounter  Procedures  . EKG 12-Lead      Glenetta Hew, M.D., M.S. Interventional Cardiologist   Pager # [phone] Phone # [phone] [address] [place], [place] [address]   Thank you for choosing Heartcare at [facility]!!

## 2017-04-14 ENCOUNTER — Encounter: Payer: Self-pay | Admitting: Cardiology

## 2017-04-14 NOTE — Assessment & Plan Note (Signed)
Well-controlled with carvedilol

## 2017-04-14 NOTE — Assessment & Plan Note (Addendum)
On atorvastatin.  Do not have recent labs.  However labs back in 2017 look pretty well controlled.  No myalgias.

## 2017-04-14 NOTE — Assessment & Plan Note (Addendum)
Now that her echocardiogram shows preserved EF, she no longer has systolic heart failure.  But still has diastolic dysfunction.  Thankfully, and no active symptoms.  She remains on stable dose of carvedilol, Entresto and spironolactone.  Not able to tolerate full dose of spironolactone because of diarrhea. Is on standing dose of 40 mg twice daily Lasix.  At this point I think we can try to reduce to once daily Lasix and using additional 40 mg as needed.  See patient instructions for sliding scale below.

## 2017-04-14 NOTE — Assessment & Plan Note (Signed)
Doing quite well with relatively well-controlled heart failure symptoms.  No active heart failure or angina symptoms.  No arrhythmias noted.  As such no further evaluation is needed prior to low risk hand surgery next week.

## 2017-04-14 NOTE — Assessment & Plan Note (Signed)
Center has a history of retention, now well controlled.  No need to adjust medications.

## 2017-04-14 NOTE — Assessment & Plan Note (Signed)
Essentially almost resolved cardiomyopathy now with only residual diastolic dysfunction.  However for now would continue with her restorative medications including carvedilol and Entresto along with spironolactone.  We can probably back off on some of the Lasix to once daily dosing.  The additional dose will be as needed.

## 2017-04-18 ENCOUNTER — Other Ambulatory Visit: Payer: Self-pay

## 2017-04-18 ENCOUNTER — Encounter (HOSPITAL_BASED_OUTPATIENT_CLINIC_OR_DEPARTMENT_OTHER): Payer: Self-pay | Admitting: *Deleted

## 2017-04-19 NOTE — H&P (Signed)
[name] is an 72 y.o. female.   Chief Complaint:Increased pain and discomfort left ring finger HPI: Continuous lockind and pain  Past Medical History:  Diagnosis Date  . Breast cancer ([place])    breast - left   . Chronic combined systolic and diastolic CHF (congestive heart failure) (HCC) -Systolic Dysfunction resolved.    a. cMRI 5/10: EF 38% // b. Echo 3/13/: EF 50-55, Gr 1 DD // c. Echo 8/14: EF 40, inf/inf-septal HK, Gr 1 DD, mild MR // d. Echo 5/17: EF 40-45, inf HK, Gr 1 DD, mild MR, mild LAE, reduced RVSF, mild TR, PASP 33 // e. Echo 10/17: EF 40-45, Gr 2 DD, mild MR, severe LAE  . Coronary artery disease, non-occlusive    a. nonobs by LHC in 2010 // b. Myoview 10/17: Lg infarct apex, distal ant and lat walls, no ischemia, EF 38; int risk  // c. [place] 10/17:  dLAD 15, pLCx 30, mRCA 40, LVEDP 15  . Diabetes mellitus   . NICM (nonischemic cardiomyopathy) [facility] [address])     Past Surgical History:  Procedure Laterality Date  . APPENDECTOMY    . BREAST LUMPECTOMY     lt breast  . CARDIAC CATHETERIZATION N/A 12/20/2015   Procedure: Left Heart Cath and Coronary Angiography;  Surgeon: Nelva Bush, MD;  Location: [place] [facility];  Service: Cardiovascular;  Laterality: N/A;  . RIGHT HEART CATH N/A 05/08/2016   Procedure: Right Heart Cath;  Surgeon: Jolaine Artist, MD;  Location: [place] [facility];  Service: Cardiovascular;  Laterality: N/A;  . TRANSTHORACIC ECHOCARDIOGRAM  11/2015   EF 40 and 45%.  GRII DD  . TRANSTHORACIC ECHOCARDIOGRAM  04/2016   EF 20% with grade 2 diastolic dysfunction  . TRANSTHORACIC ECHOCARDIOGRAM  10/2016   EF 55-60%.  Normal LV function.  Mild diastolic dysfunction.  Normal valve function    Family History  Problem Relation Age of Onset  . Stroke Father    Social History:  reports that she quit smoking about 4 years ago. She quit after 30.00 years of use. she has never used smokeless tobacco. She reports that she does not drink alcohol or use  drugs.  Allergies:  Allergies  Allergen Reactions  . Tape Rash    Pt can tolerate the paper tape    No medications prior to admission.    No results found for this or any previous visit (from the past 48 hour(s)). No results found.  Review of Systems  Constitutional: Negative.   HENT: Negative.   Eyes: Negative.   Respiratory: Negative.   Cardiovascular: Negative.   Gastrointestinal: Negative.   Genitourinary: Negative.   Musculoskeletal: Negative.   Skin: Negative.   Neurological: Negative.   Endo/Heme/Allergies: Negative.   Psychiatric/Behavioral: Negative.     Height 5' 3.5" (1.613 m), weight 70.3 kg (155 lb). Physical Exam   Assessment/Plan Left ring finger trigger finger for release  [name] L, MD 04/19/2017, 1:31 PM

## 2017-04-23 ENCOUNTER — Encounter (HOSPITAL_BASED_OUTPATIENT_CLINIC_OR_DEPARTMENT_OTHER): Payer: Self-pay | Admitting: Anesthesiology

## 2017-04-23 ENCOUNTER — Ambulatory Visit (HOSPITAL_BASED_OUTPATIENT_CLINIC_OR_DEPARTMENT_OTHER): Payer: Medicare Other | Admitting: Anesthesiology

## 2017-04-23 ENCOUNTER — Encounter (HOSPITAL_BASED_OUTPATIENT_CLINIC_OR_DEPARTMENT_OTHER)
Admission: RE | Disposition: A | Discharge status: Home / Self Care | Payer: Self-pay | Source: Ambulatory Visit | Attending: Specialist

## 2017-04-23 ENCOUNTER — Other Ambulatory Visit: Payer: Self-pay

## 2017-04-23 ENCOUNTER — Ambulatory Visit (HOSPITAL_BASED_OUTPATIENT_CLINIC_OR_DEPARTMENT_OTHER)
Admission: RE | Admit: 2017-04-23 | Discharge: 2017-04-23 | Disposition: A | Discharge status: Home / Self Care | Payer: Medicare Other | Source: Ambulatory Visit | Attending: Specialist | Admitting: Specialist

## 2017-04-23 DIAGNOSIS — E785 Hyperlipidemia, unspecified: Secondary | ICD-10-CM | POA: Insufficient documentation

## 2017-04-23 DIAGNOSIS — I110 Hypertensive heart disease with heart failure: Secondary | ICD-10-CM | POA: Diagnosis not present

## 2017-04-23 DIAGNOSIS — Z794 Long term (current) use of insulin: Secondary | ICD-10-CM | POA: Diagnosis not present

## 2017-04-23 DIAGNOSIS — E119 Type 2 diabetes mellitus without complications: Secondary | ICD-10-CM | POA: Insufficient documentation

## 2017-04-23 DIAGNOSIS — Z853 Personal history of malignant neoplasm of breast: Secondary | ICD-10-CM | POA: Diagnosis not present

## 2017-04-23 DIAGNOSIS — M65342 Trigger finger, left ring finger: Secondary | ICD-10-CM | POA: Diagnosis not present

## 2017-04-23 DIAGNOSIS — I2510 Atherosclerotic heart disease of native coronary artery without angina pectoris: Secondary | ICD-10-CM | POA: Insufficient documentation

## 2017-04-23 DIAGNOSIS — Z888 Allergy status to other drugs, medicaments and biological substances status: Secondary | ICD-10-CM | POA: Insufficient documentation

## 2017-04-23 DIAGNOSIS — Z79899 Other long term (current) drug therapy: Secondary | ICD-10-CM | POA: Diagnosis not present

## 2017-04-23 DIAGNOSIS — I5042 Chronic combined systolic (congestive) and diastolic (congestive) heart failure: Secondary | ICD-10-CM | POA: Insufficient documentation

## 2017-04-23 DIAGNOSIS — Z87891 Personal history of nicotine dependence: Secondary | ICD-10-CM | POA: Diagnosis not present

## 2017-04-23 HISTORY — PX: TRIGGER FINGER RELEASE: SHX641

## 2017-04-23 LAB — POCT I-STAT, CHEM 8
BUN: 35 mg/dL — ABNORMAL HIGH (ref 6–20)
CREATININE: 1.5 mg/dL — AB (ref 0.44–1.00)
Calcium, Ion: 1.25 mmol/L (ref 1.15–1.40)
Chloride: 102 mmol/L (ref 101–111)
Glucose, Bld: 79 mg/dL (ref 65–99)
HEMATOCRIT: 38 % (ref 36.0–46.0)
HEMOGLOBIN: 12.9 g/dL (ref 12.0–15.0)
Potassium: 4.3 mmol/L (ref 3.5–5.1)
SODIUM: 142 mmol/L (ref 135–145)
TCO2: 29 mmol/L (ref 22–32)

## 2017-04-23 LAB — GLUCOSE, CAPILLARY: Glucose-Capillary: 78 mg/dL (ref 65–99)

## 2017-04-23 SURGERY — RELEASE, A1 PULLEY, FOR TRIGGER FINGER
Anesthesia: Regional | Site: Hand | Laterality: Left

## 2017-04-23 MED ORDER — PROPOFOL 10 MG/ML IV BOLUS
INTRAVENOUS | Status: DC | PRN
  Administered 2017-04-23: 150 mg via INTRAVENOUS
  Administered 2017-04-23: 50 mg via INTRAVENOUS

## 2017-04-23 MED ORDER — LIDOCAINE-EPINEPHRINE (PF) 1 %-1:200000 IJ SOLN
INTRAMUSCULAR | Status: AC
  Filled 2017-04-23: qty 30

## 2017-04-23 MED ORDER — FENTANYL CITRATE (PF) 100 MCG/2ML IJ SOLN
INTRAMUSCULAR | Status: DC | PRN
  Administered 2017-04-23 (×2): 50 ug via INTRAVENOUS

## 2017-04-23 MED ORDER — LIDOCAINE 2% (20 MG/ML) 5 ML SYRINGE
INTRAMUSCULAR | Status: AC
  Filled 2017-04-23: qty 5

## 2017-04-23 MED ORDER — PROPOFOL 500 MG/50ML IV EMUL
INTRAVENOUS | Status: AC
  Filled 2017-04-23: qty 50

## 2017-04-23 MED ORDER — DEXAMETHASONE SODIUM PHOSPHATE 4 MG/ML IJ SOLN
INTRAMUSCULAR | Status: DC | PRN
  Administered 2017-04-23: 10 mg via INTRAVENOUS

## 2017-04-23 MED ORDER — FENTANYL CITRATE (PF) 100 MCG/2ML IJ SOLN
INTRAMUSCULAR | Status: AC
  Filled 2017-04-23: qty 2

## 2017-04-23 MED ORDER — BUPIVACAINE-EPINEPHRINE (PF) 0.5% -1:200000 IJ SOLN
INTRAMUSCULAR | Status: AC
Start: 2017-04-23 — End: ?
  Filled 2017-04-23: qty 30

## 2017-04-23 MED ORDER — LIDOCAINE HCL 2 % EX GEL
CUTANEOUS | Status: AC
  Filled 2017-04-23: qty 20

## 2017-04-23 MED ORDER — LIDOCAINE-EPINEPHRINE 0.5 %-1:200000 IJ SOLN
INTRAMUSCULAR | Status: AC
  Filled 2017-04-23: qty 1

## 2017-04-23 MED ORDER — LIDOCAINE HCL 2 % IJ SOLN
INTRAMUSCULAR | Status: AC
  Filled 2017-04-23: qty 100

## 2017-04-23 MED ORDER — EPHEDRINE SULFATE 50 MG/ML IJ SOLN
INTRAMUSCULAR | Status: DC | PRN
  Administered 2017-04-23 (×2): 25 mg via INTRAVENOUS

## 2017-04-23 MED ORDER — EPINEPHRINE 30 MG/30ML IJ SOLN
INTRAMUSCULAR | Status: AC
  Filled 2017-04-23: qty 1

## 2017-04-23 MED ORDER — FENTANYL CITRATE (PF) 100 MCG/2ML IJ SOLN: 25.0000 ug | INTRAMUSCULAR | Status: DC | PRN

## 2017-04-23 MED ORDER — LACTATED RINGERS IV SOLN
INTRAVENOUS | Status: DC | PRN
  Administered 2017-04-23: 07:00:00 via INTRAVENOUS

## 2017-04-23 MED ORDER — LIDOCAINE HCL (PF) 1 % IJ SOLN
INTRAMUSCULAR | Status: DC | PRN
  Administered 2017-04-23: 7 mL

## 2017-04-23 MED ORDER — CHLORHEXIDINE GLUCONATE CLOTH 2 % EX PADS: 6.0000 | MEDICATED_PAD | Freq: Once | CUTANEOUS | Status: DC

## 2017-04-23 MED ORDER — LIDOCAINE HCL (CARDIAC) 20 MG/ML IV SOLN
INTRAVENOUS | Status: DC | PRN
  Administered 2017-04-23: 30 mg via INTRAVENOUS

## 2017-04-23 MED ORDER — SCOPOLAMINE 1 MG/3DAYS TD PT72: 1.0000 | MEDICATED_PATCH | Freq: Once | TRANSDERMAL | Status: DC | PRN

## 2017-04-23 MED ORDER — MIDAZOLAM HCL 2 MG/2ML IJ SOLN: 1.0000 mg | INTRAMUSCULAR | Status: DC | PRN

## 2017-04-23 MED ORDER — BUPIVACAINE HCL (PF) 0.25 % IJ SOLN
INTRAMUSCULAR | Status: AC
  Filled 2017-04-23: qty 30

## 2017-04-23 MED ORDER — LACTATED RINGERS IV SOLN
INTRAVENOUS | Status: DC
  Administered 2017-04-23: 10 mL/h via INTRAVENOUS

## 2017-04-23 MED ORDER — ONDANSETRON HCL 4 MG/2ML IJ SOLN
INTRAMUSCULAR | Status: AC
  Filled 2017-04-23: qty 2

## 2017-04-23 MED ORDER — ONDANSETRON HCL 4 MG/2ML IJ SOLN: 4.0000 mg | Freq: Once | INTRAMUSCULAR | Status: DC | PRN

## 2017-04-23 MED ORDER — SODIUM BICARBONATE 4 % IV SOLN
INTRAVENOUS | Status: AC
  Filled 2017-04-23: qty 5

## 2017-04-23 MED ORDER — LIDOCAINE HCL (PF) 1 % IJ SOLN
INTRAMUSCULAR | Status: AC
  Filled 2017-04-23: qty 30

## 2017-04-23 MED ORDER — ONDANSETRON HCL 4 MG/2ML IJ SOLN
INTRAMUSCULAR | Status: DC | PRN
  Administered 2017-04-23: 4 mg via INTRAVENOUS

## 2017-04-23 MED ORDER — DEXAMETHASONE SODIUM PHOSPHATE 10 MG/ML IJ SOLN
INTRAMUSCULAR | Status: AC
  Filled 2017-04-23: qty 1

## 2017-04-23 MED ORDER — CEFAZOLIN SODIUM-DEXTROSE 2-4 GM/100ML-% IV SOLN
2.0000 g | INTRAVENOUS | Status: AC
  Administered 2017-04-23: 2 g via INTRAVENOUS

## 2017-04-23 MED ORDER — FENTANYL CITRATE (PF) 100 MCG/2ML IJ SOLN: 50.0000 ug | INTRAMUSCULAR | Status: DC | PRN

## 2017-04-23 SURGICAL SUPPLY — 49 items
APL SKNCLS STERI-STRIP NONHPOA (GAUZE/BANDAGES/DRESSINGS)
BANDAGE ACE 3X5.8 VEL STRL LF (GAUZE/BANDAGES/DRESSINGS) ×3 IMPLANT
BENZOIN TINCTURE PRP APPL 2/3 (GAUZE/BANDAGES/DRESSINGS) ×1 IMPLANT
BLADE KNIFE PERSONA 10 (BLADE) IMPLANT
BLADE KNIFE PERSONA 15 (BLADE) ×3 IMPLANT
BNDG CMPR 9X4 STRL LF SNTH (GAUZE/BANDAGES/DRESSINGS) ×1
BNDG ESMARK 4X9 LF (GAUZE/BANDAGES/DRESSINGS) ×3 IMPLANT
BNDG GAUZE ELAST 4 BULKY (GAUZE/BANDAGES/DRESSINGS) ×3 IMPLANT
BRUSH SCRUB EZ PLAIN DRY (MISCELLANEOUS) ×3 IMPLANT
CLEANER CAUTERY TIP 5X5 PAD (MISCELLANEOUS) IMPLANT
COVER BACK TABLE 60X90IN (DRAPES) ×3 IMPLANT
COVER MAYO STAND STRL (DRAPES) ×3 IMPLANT
DECANTER SPIKE VIAL GLASS SM (MISCELLANEOUS) IMPLANT
DRAPE EXTREMITY T 121X128X90 (DRAPE) ×3 IMPLANT
DRAPE SURG 17X23 STRL (DRAPES) ×3 IMPLANT
ELECT NDL TIP 2.8 STRL (NEEDLE) ×1 IMPLANT
ELECT NEEDLE TIP 2.8 STRL (NEEDLE) ×3 IMPLANT
ELECT REM PT RETURN 9FT ADLT (ELECTROSURGICAL) ×3
ELECTRODE REM PT RTRN 9FT ADLT (ELECTROSURGICAL) ×1 IMPLANT
GAUZE SPONGE 4X4 12PLY STRL (GAUZE/BANDAGES/DRESSINGS) ×3 IMPLANT
GAUZE XEROFORM 1X8 LF (GAUZE/BANDAGES/DRESSINGS) ×3 IMPLANT
GLOVE BIOGEL M STRL SZ7.5 (GLOVE) ×2 IMPLANT
GLOVE ECLIPSE 7.0 STRL STRAW (GLOVE) ×3 IMPLANT
GOWN STRL REUS W/ TWL LRG LVL3 (GOWN DISPOSABLE) IMPLANT
GOWN STRL REUS W/ TWL XL LVL3 (GOWN DISPOSABLE) ×1 IMPLANT
GOWN STRL REUS W/TWL LRG LVL3 (GOWN DISPOSABLE)
GOWN STRL REUS W/TWL XL LVL3 (GOWN DISPOSABLE) ×6
MARKER SKIN DUAL TIP RULER LAB (MISCELLANEOUS) ×3 IMPLANT
NDL HYPO 25X1 1.5 SAFETY (NEEDLE) IMPLANT
NEEDLE HYPO 25X1 1.5 SAFETY (NEEDLE) IMPLANT
NS IRRIG 1000ML POUR BTL (IV SOLUTION) IMPLANT
PACK BASIN DAY SURGERY FS (CUSTOM PROCEDURE TRAY) ×3 IMPLANT
PAD CLEANER CAUTERY TIP 5X5 (MISCELLANEOUS)
PADDING CAST ABS 3INX4YD NS (CAST SUPPLIES)
PADDING CAST ABS COTTON 3X4 (CAST SUPPLIES) IMPLANT
PENCIL BUTTON HOLSTER BLD 10FT (ELECTRODE) ×3 IMPLANT
SPLINT UNIVERSAL RIGHT (SOFTGOODS) IMPLANT
SPLINT WRIST 10 LEFT UNV (SOFTGOODS) IMPLANT
STOCKINETTE 4X48 STRL (DRAPES) ×3 IMPLANT
STRIP SUTURE WOUND CLOSURE 1/2 (SUTURE) IMPLANT
SUT ETHILON 5 0 PS 2 18 (SUTURE) IMPLANT
SUT MNCRL AB 3-0 PS2 18 (SUTURE) IMPLANT
SUT PROLENE 3 0 PS 2 (SUTURE) ×2 IMPLANT
SUT PROLENE 4 0 PS 2 18 (SUTURE) IMPLANT
SYR BULB 3OZ (MISCELLANEOUS) IMPLANT
SYR CONTROL 10ML LL (SYRINGE) IMPLANT
TOWEL OR 17X24 6PK STRL BLUE (TOWEL DISPOSABLE) ×3 IMPLANT
TRAY DSU PREP LF (CUSTOM PROCEDURE TRAY) ×3 IMPLANT
UNDERPAD 30X30 (UNDERPADS AND DIAPERS) ×3 IMPLANT

## 2017-04-23 NOTE — Anesthesia Procedure Notes (Signed)
Procedure Name: LMA Insertion Date/Time: 04/23/2017 7:46 AM Performed by: Marrianne Mood, CRNA Pre-anesthesia Checklist: Patient identified, Emergency Drugs available, Suction available, Patient being monitored and Timeout performed Patient Re-evaluated:Patient Re-evaluated prior to induction Oxygen Delivery Method: Circle system utilized Preoxygenation: Pre-oxygenation with 100% oxygen Induction Type: IV induction Ventilation: Mask ventilation without difficulty LMA: LMA inserted LMA Size: 4.0 Number of attempts: 1 Airway Equipment and Method: Bite block Placement Confirmation: positive ETCO2 Tube secured with: Tape Dental Injury: Teeth and Oropharynx as per pre-operative assessment

## 2017-04-23 NOTE — Brief Op Note (Signed)
04/23/2017  8:19 AM  PATIENT:  [name]  72 y.o. female  PRE-OPERATIVE DIAGNOSIS:  trigger finger  POST-OPERATIVE DIAGNOSIS:  trigger finger  PROCEDURE:  Procedure(s): RELEASE TRIGGER FINGER TO LEFT FORTH FINGER (Left)  SURGEON:  Surgeon(s) and Role:    * Cristine Polio, MD - Primary  PHYSICIAN ASSISTANT:   ASSISTANTS: none   ANESTHESIA:   general  EBL:  2 mL   BLOOD ADMINISTERED:none  DRAINS: none   LOCAL MEDICATIONS USED:  LIDOCAINE   SPECIMEN:  No Specimen  DISPOSITION OF SPECIMEN:  N/A  COUNTS:  YES  TOURNIQUET:   Total Tourniquet Time Documented: Upper Arm (Left) - 13 minutes Total: Upper Arm (Left) - 13 minutes   DICTATION: .Other Dictation: Dictation Number [id number]  PLAN OF CARE: Discharge to home after PACU  PATIENT DISPOSITION:  PACU - hemodynamically stable.   Delay start of Pharmacological VTE agent (>24hrs) due to surgical blood loss or risk of bleeding: yes

## 2017-04-23 NOTE — Op Note (Signed)
NAMETASHE, PURDON                  ACCOUNT NO.:  [id number]  MEDICAL RECORD NO.:  [id number]  LOCATION:                                 FACILITY:  PHYSICIAN:  Berneta Sages L. Towanda Malkin, M.D.DATE OF BIRTH:  1945-05-15  DATE OF PROCEDURE:  04/23/2017 DATE OF DISCHARGE:  04/23/2017                              OPERATIVE REPORT   [name] is a 72 year old lady, who has severe left ring finger triggering, increased pain discomfort.  PROCEDURE DONE:  Release.  ANESTHESIA:  Bier block.  DESCRIPTION OF PROCEDURE:  The patient underwent Bier block anesthesia. Prep was done to the left hand and arm areas with Betadine soap and solution, walled off with sterile towels and drapes so as to make a sterile field.  Incision was made in the MP joint on the volar aspect of the left hand, carried down to skin.  After the tourniquet had been placed up to 250 mmHg down to underlying tendon sheath, the tendon sheath and A1 pulley were released halfway and then moving the finger, we could see the nodular areas passing through without resistance. After this, the wounds were reclosed back with 4-0 Prolene sutures. Sterile dressings were applied and the finger remained in a hand dressing.  She tolerated the procedures very well and was taken to Recovery in excellent condition.  ESTIMATED BLOOD LOSS:  Nil.     [name]. Towanda Malkin, M.D.     Elie Confer  D:  04/23/2017  T:  04/23/2017  Job:  [id number]

## 2017-04-23 NOTE — Transfer of Care (Signed)
Immediate Anesthesia Transfer of Care Note  Patient: [name]  Procedure(s) Performed: RELEASE TRIGGER FINGER TO LEFT FORTH FINGER (Left Hand)  Patient Location: PACU  Anesthesia Type:General  Level of Consciousness: awake and patient cooperative  Airway & Oxygen Therapy: Patient Spontanous Breathing and Patient connected to face mask oxygen  Post-op Assessment: Report given to RN and Post -op Vital signs reviewed and stable  Post vital signs: Reviewed and stable  Last Vitals:  Vitals:   04/23/17 0714  BP: (!) 113/52  Pulse: 63  Resp: 18  Temp: 36.6 C  SpO2: 100%    Last Pain:  Vitals:   04/23/17 0714  TempSrc: Oral      Patients Stated Pain Goal: 1 (76/19/50 9326)  Complications: No apparent anesthesia complications

## 2017-04-23 NOTE — Anesthesia Preprocedure Evaluation (Addendum)
Anesthesia Evaluation  Patient identified by MRN, date of birth, ID band Patient awake    Reviewed: Allergy & Precautions, NPO status , Patient's Chart, lab work & pertinent test results  Airway Mallampati: II  TM Distance: >3 FB Neck ROM: Full    Dental  (+) Upper Dentures, Lower Dentures   Pulmonary former smoker,    Pulmonary exam normal breath sounds clear to auscultation       Cardiovascular hypertension, Pt. on home beta blockers and Pt. on medications + CAD and +CHF  Normal cardiovascular exam Rhythm:Regular Rate:Normal  ECG: SB, rate 59  ECHO: Normal LV systolic function; mild diastolic dysfunction  Pre-op eval per cardiologist   Neuro/Psych negative neurological ROS  negative psych ROS   GI/Hepatic negative GI ROS, Neg liver ROS,   Endo/Other  diabetes, Insulin Dependent  Renal/GU Renal disease     Musculoskeletal negative musculoskeletal ROS (+)   Abdominal   Peds  Hematology HLD   Anesthesia Other Findings trigger finger  Reproductive/Obstetrics                            Anesthesia Physical Anesthesia Plan  ASA: III  Anesthesia Plan: Bier Block and Bier Block-LIDOCAINE ONLY   Post-op Pain Management:    Induction: Intravenous  PONV Risk Score and Plan: 2 and Propofol infusion, Treatment may vary due to age or medical condition and Ondansetron  Airway Management Planned: Natural Airway  Additional Equipment:   Intra-op Plan:   Post-operative Plan:   Informed Consent: I have reviewed the patients History and Physical, chart, labs and discussed the procedure including the risks, benefits and alternatives for the proposed anesthesia with the patient or authorized representative who has indicated his/her understanding and acceptance.   Dental advisory given  Plan Discussed with: CRNA  Anesthesia Plan Comments:         Anesthesia Quick Evaluation

## 2017-04-23 NOTE — Discharge Instructions (Signed)
Activity (include date of return to work if known) °As tolerated: NO showers °NO driving °No heavy activities ° °Diet:regular No restrictions: ° °Wound Care: Keep dressing clean & dry ° °Do not change dressings °For Abdominoplasties wear abdominal binder °Special Instructions: °Do not raise arms up °Continue to empty, recharge, & record drainage from J-P drains &/or °Hemovacs 2-3 times a day, as needed. °Call Doctor if any unusual problems occur such as pain, excessive °Bleeding, unrelieved Nausea/vomiting, Fever &/or chills °When lying down, keep head elevated on 2-3 pillows or back-rest °For Addominoplasties the Jack-knife position °Follow-up appointment: Please call the office. ° °The patient received discharge instruction from:___________________________________________ ° ° °Patient signature ________________________________________ / Date___________ ° ° ° °Signature of individual providing instructions/ Date________________             ° ° ° ° ° ° °Post Anesthesia Home Care Instructions ° °Activity: °Get plenty of rest for the remainder of the day. A responsible individual must stay with you for 24 hours following the procedure.  °For the next 24 hours, DO NOT: °-Drive a car °-Operate machinery °-Drink alcoholic beverages °-Take any medication unless instructed by your physician °-Make any legal decisions or sign important papers. ° °Meals: °Start with liquid foods such as gelatin or soup. Progress to regular foods as tolerated. Avoid greasy, spicy, heavy foods. If nausea and/or vomiting occur, drink only clear liquids until the nausea and/or vomiting subsides. Call your physician if vomiting continues. ° °Special Instructions/Symptoms: °Your throat may feel dry or sore from the anesthesia or the breathing tube placed in your throat during surgery. If this causes discomfort, gargle with warm salt water. The discomfort should disappear within 24 hours. ° °If you had a scopolamine patch placed behind your ear for  the management of post- operative nausea and/or vomiting: ° °1. The medication in the patch is effective for 72 hours, after which it should be removed.  Wrap patch in a tissue and discard in the trash. Wash hands thoroughly with soap and water. °2. You may remove the patch earlier than 72 hours if you experience unpleasant side effects which may include dry mouth, dizziness or visual disturbances. °3. Avoid touching the patch. Wash your hands with soap and water after contact with the patch. °  ° °

## 2017-04-23 NOTE — Anesthesia Postprocedure Evaluation (Signed)
Anesthesia Post Note  Patient: [name]  Procedure(s) Performed: RELEASE TRIGGER FINGER TO LEFT FORTH FINGER (Left Hand)     Patient location during evaluation: PACU Anesthesia Type: General Level of consciousness: awake Pain management: pain level controlled Vital Signs Assessment: post-procedure vital signs reviewed and stable Respiratory status: spontaneous breathing, nonlabored ventilation, respiratory function stable and patient connected to nasal cannula oxygen Cardiovascular status: blood pressure returned to baseline and stable Postop Assessment: no apparent nausea or vomiting Anesthetic complications: no    Last Vitals:  Vitals:   04/23/17 0850 04/23/17 0927  BP:  123/60  Pulse: 62 64  Resp: 14 16  Temp:  (!) 36.4 C  SpO2: 100% 100%    Last Pain:  Vitals:   04/23/17 0927  TempSrc:   PainSc: 0-No pain                 Ryan P Ellender

## 2017-04-24 ENCOUNTER — Encounter (HOSPITAL_BASED_OUTPATIENT_CLINIC_OR_DEPARTMENT_OTHER): Payer: Self-pay | Admitting: Specialist

## 2017-05-14 ENCOUNTER — Telehealth: Payer: Self-pay

## 2017-05-14 NOTE — Telephone Encounter (Signed)
Pt walked in to office today to get refills on some of her medication and drop off her [employer] Patient assistance paperwork.  [facility] and they completed her paper work  last year for her Delene Loll. She only has 1 day left (2 pills). Pt instructed to call [employer] as she normally does to get her refills as current assistance is good until 06/16/2017. Pt was also given 1 weeks worth of sample of Entresto 49/51 until her delivery arrives. Paperwork put in Dr. Allison Quarry box for his review.  Pt instructed to call her local pharmacy for her other medication refills. No additional questions at this time.

## 2017-05-15 ENCOUNTER — Emergency Department (HOSPITAL_COMMUNITY)
Admission: EM | Admit: 2017-05-15 | Discharge: 2017-05-15 | Disposition: A | Discharge status: Home / Self Care | Payer: Medicare Other | Attending: Emergency Medicine | Admitting: Emergency Medicine

## 2017-05-15 ENCOUNTER — Emergency Department (HOSPITAL_COMMUNITY): Payer: Medicare Other

## 2017-05-15 DIAGNOSIS — I5042 Chronic combined systolic (congestive) and diastolic (congestive) heart failure: Secondary | ICD-10-CM | POA: Insufficient documentation

## 2017-05-15 DIAGNOSIS — J449 Chronic obstructive pulmonary disease, unspecified: Secondary | ICD-10-CM | POA: Insufficient documentation

## 2017-05-15 DIAGNOSIS — Z79899 Other long term (current) drug therapy: Secondary | ICD-10-CM | POA: Insufficient documentation

## 2017-05-15 DIAGNOSIS — R1110 Vomiting, unspecified: Secondary | ICD-10-CM | POA: Diagnosis not present

## 2017-05-15 DIAGNOSIS — Z794 Long term (current) use of insulin: Secondary | ICD-10-CM | POA: Diagnosis not present

## 2017-05-15 DIAGNOSIS — R55 Syncope and collapse: Secondary | ICD-10-CM | POA: Diagnosis not present

## 2017-05-15 DIAGNOSIS — R197 Diarrhea, unspecified: Secondary | ICD-10-CM | POA: Diagnosis not present

## 2017-05-15 DIAGNOSIS — N182 Chronic kidney disease, stage 2 (mild): Secondary | ICD-10-CM | POA: Diagnosis not present

## 2017-05-15 DIAGNOSIS — I130 Hypertensive heart and chronic kidney disease with heart failure and stage 1 through stage 4 chronic kidney disease, or unspecified chronic kidney disease: Secondary | ICD-10-CM | POA: Insufficient documentation

## 2017-05-15 DIAGNOSIS — E119 Type 2 diabetes mellitus without complications: Secondary | ICD-10-CM | POA: Insufficient documentation

## 2017-05-15 DIAGNOSIS — E860 Dehydration: Secondary | ICD-10-CM | POA: Insufficient documentation

## 2017-05-15 LAB — COMPREHENSIVE METABOLIC PANEL
ALBUMIN: 3.6 g/dL (ref 3.5–5.0)
ALT: 20 U/L (ref 14–54)
AST: 19 U/L (ref 15–41)
Alkaline Phosphatase: 100 U/L (ref 38–126)
Anion gap: 10 (ref 5–15)
BUN: 34 mg/dL — AB (ref 6–20)
CHLORIDE: 101 mmol/L (ref 101–111)
CO2: 27 mmol/L (ref 22–32)
Calcium: 9.1 mg/dL (ref 8.9–10.3)
Creatinine, Ser: 1.59 mg/dL — ABNORMAL HIGH (ref 0.44–1.00)
GFR calc Af Amer: 36 mL/min — ABNORMAL LOW (ref >60)
GFR calc non Af Amer: 31 mL/min — ABNORMAL LOW (ref >60)
Glucose, Bld: 194 mg/dL — ABNORMAL HIGH (ref 65–99)
POTASSIUM: 3.8 mmol/L (ref 3.5–5.1)
SODIUM: 138 mmol/L (ref 135–145)
Total Bilirubin: 0.8 mg/dL (ref 0.3–1.2)
Total Protein: 6.5 g/dL (ref 6.5–8.1)

## 2017-05-15 LAB — MAGNESIUM: Magnesium: 2.1 mg/dL (ref 1.7–2.4)

## 2017-05-15 LAB — CBC WITH DIFFERENTIAL/PLATELET
BASOS ABS: 0 10*3/uL (ref 0.0–0.1)
BASOS PCT: 0 %
EOS ABS: 0.1 10*3/uL (ref 0.0–0.7)
EOS PCT: 1 %
HCT: 35.1 % — ABNORMAL LOW (ref 36.0–46.0)
Hemoglobin: 12.1 g/dL (ref 12.0–15.0)
Lymphocytes Relative: 26 %
Lymphs Abs: 2.4 10*3/uL (ref 0.7–4.0)
MCH: 32.3 pg (ref 26.0–34.0)
MCHC: 34.5 g/dL (ref 30.0–36.0)
MCV: 93.6 fL (ref 78.0–100.0)
MONO ABS: 0.7 10*3/uL (ref 0.1–1.0)
Monocytes Relative: 8 %
Neutro Abs: 5.9 10*3/uL (ref 1.7–7.7)
Neutrophils Relative %: 65 %
PLATELETS: 277 10*3/uL (ref 150–400)
RBC: 3.75 MIL/uL — ABNORMAL LOW (ref 3.87–5.11)
RDW: 12.5 % (ref 11.5–15.5)
WBC: 9.2 10*3/uL (ref 4.0–10.5)

## 2017-05-15 LAB — BRAIN NATRIURETIC PEPTIDE: B NATRIURETIC PEPTIDE 5: 69.5 pg/mL (ref 0.0–100.0)

## 2017-05-15 LAB — TROPONIN I: Troponin I: <0.03 ng/mL (ref <0.03)

## 2017-05-15 MED ORDER — SODIUM CHLORIDE 0.9 % IV BOLUS
250.0000 mL | Freq: Once | INTRAVENOUS | Status: AC
  Administered 2017-05-15: 250 mL via INTRAVENOUS

## 2017-05-15 MED ORDER — ONDANSETRON HCL 4 MG PO TABS: 4.0000 mg | ORAL_TABLET | Freq: Four times a day (QID) | ORAL | 0 refills | Status: DC | PRN

## 2017-05-15 MED ORDER — MECLIZINE HCL 12.5 MG PO TABS: 12.5000 mg | ORAL_TABLET | Freq: Three times a day (TID) | ORAL | 0 refills | Status: DC | PRN

## 2017-05-15 NOTE — ED Notes (Addendum)
Ambulated patient in the hall and she did very well, states she did not get dizzy or fatigue.

## 2017-05-15 NOTE — ED Triage Notes (Addendum)
Per EMS, pt is coming from work after experiencing a near syncope episode. Pt was going to the restroom when suddenly her vision became blurry. Pt reports sitting down and vision becoming clear again. EMS states that pt had a hard time breathing and they administered 5 mg albuterol and pt had relief. Pt denies any symptoms prior to arriving at work. Pt has a hx of diabetes.

## 2017-05-15 NOTE — ED Notes (Signed)
Per RN allow fluids to infuse prior to obtaining orthostatic vitals due to bp being on the soft side.

## 2017-05-15 NOTE — ED Provider Notes (Signed)
[facility] DEPT Provider Note   CSN: [id number] Arrival date & time: 05/15/17  1009     History   Chief Complaint Chief Complaint  Patient presents with  . Near Syncope    HPI [name] is a 72 y.o. female.  HPI Patient states she has not felt well for the past 2 days.  She has had multiple episodes of diarrhea, nausea and dizziness while at home.  She also has had episodic abdominal pain.  Daughter has had similar symptoms.  States she was at work this morning and was having upper back and neck pain.  She then developed urge to have a bowel movement and nausea.  Was unable to stand due to dizziness which she describes as both feel like she was going to pass out and spinning sensation.  She vomited once.  She denies any chest pain during this time.  She was noted to be wheezing by EMS and given albuterol treatment.  Patient states that her dizziness has improved.  Shortness of breath is improved.  She is currently denying any back or neck pain.  Denies any focal weakness or numbness.  No recent weight gain.  No fever or chills. Past Medical History:  Diagnosis Date  . Breast cancer ([place])    breast - left   . Chronic combined systolic and diastolic CHF (congestive heart failure) (HCC) -Systolic Dysfunction resolved.    a. cMRI 5/10: EF 38% // b. Echo 3/13/: EF 50-55, Gr 1 DD // c. Echo 8/14: EF 40, inf/inf-septal HK, Gr 1 DD, mild MR // d. Echo 5/17: EF 40-45, inf HK, Gr 1 DD, mild MR, mild LAE, reduced RVSF, mild TR, PASP 33 // e. Echo 10/17: EF 40-45, Gr 2 DD, mild MR, severe LAE  . Coronary artery disease, non-occlusive    a. nonobs by LHC in 2010 // b. Myoview 10/17: Lg infarct apex, distal ant and lat walls, no ischemia, EF 38; int risk  // c. [place] 10/17:  dLAD 15, pLCx 30, mRCA 40, LVEDP 15  . Diabetes mellitus   . NICM (nonischemic cardiomyopathy) [facility])     Patient Active Problem List   Diagnosis Date Noted  . Preop cardiovascular exam  04/12/2017  . Hematoma   . FUO (fever of unknown origin)   . Acute CHF (congestive heart failure) ([place]) 05/06/2016  . Respiratory distress   . Hyperglycemia   . CKD (chronic kidney disease), stage II 03/17/2016  . Palpitation 03/17/2016  . Elevated troponin   . Dyslipidemia, goal to be determined   . Tobacco abuse   . COPD (chronic obstructive pulmonary disease) ([place]) 06/28/2015  . Syncope 06/28/2015  . Chronic combined systolic and diastolic heart failure ([place]) 06/28/2015  . NICM (nonischemic cardiomyopathy) ([place]) 10/05/2012  . Abdominal mass, LLQ (left lower quadrant) 05/03/2012  . PULMONARY NODULE 08/05/2008  . Palpitations 08/05/2008  . Type 2 diabetes mellitus without complication, with long-term current use of insulin ([place]) 07/16/2008  . Hypertension 07/16/2008    Past Surgical History:  Procedure Laterality Date  . APPENDECTOMY    . BREAST LUMPECTOMY     lt breast  . CARDIAC CATHETERIZATION N/A 12/20/2015   Procedure: Left Heart Cath and Coronary Angiography;  Surgeon: Nelva Bush, MD;  Location: [place] [facility];  Service: Cardiovascular;  Laterality: N/A;  . RIGHT HEART CATH N/A 05/08/2016   Procedure: Right Heart Cath;  Surgeon: Jolaine Artist, MD;  Location: [place] [facility];  Service: Cardiovascular;  Laterality: N/A;  . TRANSTHORACIC ECHOCARDIOGRAM  11/2015   EF 40 and 45%.  GRII DD  . TRANSTHORACIC ECHOCARDIOGRAM  04/2016   EF 20% with grade 2 diastolic dysfunction  . TRANSTHORACIC ECHOCARDIOGRAM  10/2016   EF 55-60%.  Normal LV function.  Mild diastolic dysfunction.  Normal valve function  . TRIGGER FINGER RELEASE Left 04/23/2017   Procedure: RELEASE TRIGGER FINGER TO LEFT FORTH FINGER;  Surgeon: Cristine Polio, MD;  Location: [place];  Service: Plastics;  Laterality: Left;     OB History   None      Home Medications    Prior to Admission medications   Medication Sig Start Date End Date Taking? Authorizing Provider    carvedilol (COREG) 6.25 MG tablet Take 1 tablet (6.25 mg total) by mouth 2 (two) times daily. 12/11/16  Yes Bensimhon, Shaune Pascal, MD  furosemide (LASIX) 40 MG tablet Take 1 tablet (40 mg total) by mouth 2 (two) times daily. 12/11/16  Yes Bensimhon, Shaune Pascal, MD  insulin NPH-regular Human (NOVOLIN 70/30) (70-30) 100 UNIT/ML injection Please start using 25 units in am and 15 units at bedtime. Check CBG's three times a day (including fasting in am) 03/18/16  Yes Barton Dubois, MD  sacubitril-valsartan (ENTRESTO) 49-51 MG Take 1 tablet by mouth 2 (two) times daily. 12/11/16  Yes Bensimhon, Shaune Pascal, MD  spironolactone (ALDACTONE) 25 MG tablet Take 0.5 tablets (12.5 mg total) by mouth at bedtime. 12/11/16  Yes Bensimhon, Shaune Pascal, MD  magnesium oxide (MAG-OX) 400 (241.3 Mg) MG tablet Take 1 tablet (400 mg total) by mouth 2 (two) times daily. Patient not taking: Reported on 05/15/2017 12/21/15   Cheryln Manly, NP    Family History Family History  Problem Relation Age of Onset  . Stroke Father     Social History Social History   Tobacco Use  . Smoking status: Former Smoker    Years: 30.00    Last attempt to quit: 09/29/2012    Years since quitting: 4.6  . Smokeless tobacco: Never Used  Substance Use Topics  . Alcohol use: No  . Drug use: No     Allergies   Tape   Review of Systems Review of Systems  Constitutional: Positive for diaphoresis. Negative for chills and fever.  HENT: Negative for congestion, sore throat and trouble swallowing.   Eyes: Negative for visual disturbance.  Respiratory: Positive for shortness of breath and wheezing. Negative for cough.   Cardiovascular: Negative for chest pain, palpitations and leg swelling.  Gastrointestinal: Positive for abdominal pain, diarrhea, nausea and vomiting. Negative for blood in stool.  Genitourinary: Negative for dysuria, flank pain, frequency and hematuria.  Musculoskeletal: Positive for back pain, myalgias and neck pain.  Negative for neck stiffness.  Skin: Negative for rash and wound.  Neurological: Positive for dizziness and light-headedness. Negative for syncope, speech difficulty, weakness, numbness and headaches.  All other systems reviewed and are negative.    Physical Exam Updated Vital Signs BP 112/64   Pulse (!) 58   Temp 98 F (36.7 C) (Oral)   Resp 17   SpO2 100%   Physical Exam  Constitutional: She is oriented to person, place, and time. She appears well-developed and well-nourished. No distress.  HENT:  Head: Normocephalic and atraumatic.  Mouth/Throat: Oropharynx is clear and moist. No oropharyngeal exudate.  Eyes: Pupils are equal, round, and reactive to light. EOM are normal.  Few beats of horizontal nystagmus.  Neck: Normal range of motion. Neck supple. No JVD  present.  No stridor or bruits appreciated.  No posterior midline cervical tenderness to palpation.  No meningismus.  Mild paracervical muscular tenderness to palpation.  Cardiovascular: Normal rate and regular rhythm. Exam reveals no gallop and no friction rub.  No murmur heard. Pulmonary/Chest: Effort normal. She has wheezes.  Few scattered wheezes.  Abdominal: Soft. Bowel sounds are normal. There is tenderness. There is no rebound and no guarding.  Mild left mid abdominal tenderness to palpation.  There is no rebound or guarding.  Musculoskeletal: Normal range of motion. She exhibits no edema or tenderness.  No lower extremity swelling, asymmetry or tenderness.  Distal pulses are 2+.  Lymphadenopathy:    She has no cervical adenopathy.  Neurological: She is alert and oriented to person, place, and time.  Moves all extremities without focal deficit.  Sensation fully intact.  Patient is alert and oriented x3 with clear, goal oriented speech. Patient has 5/5 motor in all extremities. Sensation is intact to light touch. Bilateral finger-to-nose is normal with no signs of dysmetria.  Skin: Skin is warm and dry. Capillary  refill takes less than 2 seconds. No rash noted. She is not diaphoretic. No erythema.  Psychiatric: She has a normal mood and affect. Her behavior is normal.  Nursing note and vitals reviewed.    ED Treatments / Results  Labs (all labs ordered are listed, but only abnormal results are displayed) Labs Reviewed  CBC WITH DIFFERENTIAL/PLATELET - Abnormal; Notable for the following components:      Result Value   RBC 3.75 (*)    HCT 35.1 (*)    All other components within normal limits  COMPREHENSIVE METABOLIC PANEL - Abnormal; Notable for the following components:   Glucose, Bld 194 (*)    BUN 34 (*)    Creatinine, Ser 1.59 (*)    GFR calc non Af Amer 31 (*)    GFR calc Af Amer 36 (*)    All other components within normal limits  BRAIN NATRIURETIC PEPTIDE  TROPONIN I  MAGNESIUM    EKG EKG Interpretation  Date/Time:  Tuesday May 15 2017 10:38:20 EDT Ventricular Rate:  60 PR Interval:    QRS Duration: 106 QT Interval:  396 QTC Calculation: 396 R Axis:   -25 Text Interpretation:  Sinus rhythm Low voltage, precordial leads Probable LVH with secondary repol abnrm Anterior Q waves, possibly due to LVH ST elevation, consider inferior injury Confirmed by Julianne Rice [phone]) on 05/15/2017 2:18:20 PM   Radiology Dg Chest 2 View  Result Date: 05/15/2017 CLINICAL DATA:  Shortness of breath. EXAM: CHEST - 2 VIEW COMPARISON:  05/05/2016 FINDINGS: Heart and mediastinal contours are within normal limits. No focal opacities or effusions. No acute bony abnormality. IMPRESSION: No active cardiopulmonary disease. Electronically Signed   By: Rolm Baptise M.D.   On: 05/15/2017 12:00    Procedures Procedures (including critical care time)  Medications Ordered in ED Medications  sodium chloride 0.9 % bolus 250 mL (250 mLs Intravenous Given 05/15/17 1142)  sodium chloride 0.9 % bolus 250 mL (250 mLs Intravenous New Bag/Given 05/15/17 1450)     Initial Impression / Assessment and Plan  / ED Course  I have reviewed the triage vital signs and the nursing notes.  Pertinent labs & imaging results that were available during my care of the patient were reviewed by me and considered in my medical decision making (see chart for details).     Symptoms likely multifactorial.  Likely dehydration and medication related and there  may be a vertiginous component.  Patient states she is feeling much better after IV fluids.  Initial systolic blood pressure in the 90s now in the 110s/120s.  Mild elevation in creatinine consistent with dehydration.  Patient is ambulating without assistance.  She is currently symptom-free.  Advised to stay hydrated and change positions slowly.  Referred to her cardiologist and primary physician.  Return precautions given. Final Clinical Impressions(s) / ED Diagnoses   Final diagnoses:  Near syncope  Dehydration  Vomiting and diarrhea    ED Discharge Orders    None       Julianne Rice, MD 05/15/17 1529

## 2017-05-15 NOTE — ED Notes (Signed)
Bed: KF27 Expected date:  Expected time:  Means of arrival:  Comments: Hold-EMS

## 2017-06-01 ENCOUNTER — Telehealth: Payer: Self-pay | Admitting: Cardiology

## 2017-06-01 NOTE — Telephone Encounter (Signed)
   Primary Cardiologist: Glenetta Hew, MD  Chart reviewed as part of pre-operative protocol coverage. Given past medical history and time since last visit, based on ACC/AHA guidelines, [name] would be at acceptable risk for the planned procedure without further cardiovascular testing.   I will route this recommendation to the requesting party via Epic fax function and remove from pre-op pool.  Please call with questions.  Lyda Jester, PA-C 06/01/2017, 2:34 PM

## 2017-06-01 NOTE — Telephone Encounter (Signed)
   [facility] Pre-operative Risk Assessment    Request for surgical clearance:  1. What type of surgery is being performed? Cataract extraction with intraocular lens implantation of RIGHT EYE followed by LEFT EYE   2. When is this surgery scheduled? 07/09/17   3. What type of clearance is required (medical clearance vs. Pharmacy clearance to hold med vs. Both)? Medical   4. Are there any medications that need to be held prior to surgery and how long? None - per faxed request, "pt does not need to stop ANY medications"   5. Practice name and name of physician performing surgery? Dr. Darleen Crocker @ [facility]   6. What is your office phone number [phone])     7.   What is your office fax number [phone])  8.   Anesthesia type (None, local, MAC, general) ?  Topical anesthesia with IV medication    [name] 06/01/2017, 12:17 PM  _________________________________________________________________   (provider comments below)

## 2017-06-14 ENCOUNTER — Telehealth: Payer: Self-pay | Admitting: *Deleted

## 2017-06-14 DIAGNOSIS — I5042 Chronic combined systolic (congestive) and diastolic (congestive) heart failure: Secondary | ICD-10-CM

## 2017-06-14 MED ORDER — SPIRONOLACTONE 25 MG PO TABS: 12.5000 mg | ORAL_TABLET | Freq: Every day | ORAL | 6 refills | Status: DC

## 2017-06-14 MED ORDER — CARVEDILOL 6.25 MG PO TABS: 6.2500 mg | ORAL_TABLET | Freq: Two times a day (BID) | ORAL | 6 refills | Status: DC

## 2017-06-14 MED ORDER — FUROSEMIDE 40 MG PO TABS: 40.0000 mg | ORAL_TABLET | Freq: Two times a day (BID) | ORAL | 6 refills | Status: DC

## 2017-06-14 NOTE — Telephone Encounter (Signed)
Spoke to patient she states she will come by office a pick up  Patient  Assistance form  -entresto to fill out and bring it back to fax   patient request refills on all of her cardiac medication  RN  E-SENT [place]

## 2017-06-14 NOTE — Telephone Encounter (Signed)
Left message to call back - patient assistance  For entresto

## 2017-06-28 MED ORDER — ATORVASTATIN CALCIUM 40 MG PO TABS: 40.0000 mg | ORAL_TABLET | Freq: Every day | ORAL | 11 refills | Status: DC

## 2017-06-28 MED ORDER — FUROSEMIDE 40 MG PO TABS: 40.0000 mg | ORAL_TABLET | Freq: Two times a day (BID) | ORAL | 11 refills | Status: DC

## 2017-06-28 MED ORDER — SPIRONOLACTONE 25 MG PO TABS: 12.5000 mg | ORAL_TABLET | Freq: Every day | ORAL | 11 refills | Status: DC

## 2017-06-28 MED ORDER — CARVEDILOL 6.25 MG PO TABS: 6.2500 mg | ORAL_TABLET | Freq: Two times a day (BID) | ORAL | 11 refills | Status: DC

## 2017-06-28 NOTE — Telephone Encounter (Signed)
PATIENT WALKED  AND LEFT A COMPLETED  PATIENT ASSISTANCE FORM FOR  ENTRESTO.  INFORMATION FAXED TO COMPANY.   PATIENT ALSO REQUEST REFILL ON MEDICATIONS -FUROSEMIDE ,SPIRNOLACTONE ,CARVEDILOL,ATORVASTATIN DONE

## 2017-07-04 ENCOUNTER — Telehealth: Payer: Self-pay | Admitting: *Deleted

## 2017-07-04 NOTE — Telephone Encounter (Signed)
LEFT MESSAGE TO CALL BACK - IN REGARDS   RECEIVED CONFIRMATION [employer] WILL CONTINUE SUPPLYING  ENTRESTO  FOR ANOTHER YEAR.

## 2017-07-04 NOTE — Telephone Encounter (Signed)
OPEN ERROR

## 2017-07-05 ENCOUNTER — Telehealth: Payer: Self-pay | Admitting: Cardiology

## 2017-07-05 NOTE — Telephone Encounter (Signed)
will call back

## 2017-07-05 NOTE — Telephone Encounter (Signed)
Follow up   Pt verbalized that she is returning call for rn   Jul 04, 2017 Raiford Simmonds, RN   4:44 PM  Note    LEFT MESSAGE TO CALL BACK - IN REGARDS   RECEIVED CONFIRMATION [employer] WILL CONTINUE SUPPLYING  ENTRESTO  FOR ANOTHER YEAR.

## 2017-07-05 NOTE — Telephone Encounter (Signed)
Spoke to patient. Aware entresto will be extend for the rest of the year from [employer]

## 2017-08-20 ENCOUNTER — Encounter (HOSPITAL_COMMUNITY): Payer: Self-pay | Admitting: *Deleted

## 2017-08-20 ENCOUNTER — Other Ambulatory Visit: Payer: Self-pay

## 2017-08-20 ENCOUNTER — Emergency Department (HOSPITAL_COMMUNITY): Payer: Medicare Other

## 2017-08-20 ENCOUNTER — Emergency Department (HOSPITAL_COMMUNITY)
Admission: EM | Admit: 2017-08-20 | Discharge: 2017-08-20 | Disposition: A | Discharge status: Home / Self Care | Payer: Medicare Other | Attending: Emergency Medicine | Admitting: Emergency Medicine

## 2017-08-20 DIAGNOSIS — I5042 Chronic combined systolic (congestive) and diastolic (congestive) heart failure: Secondary | ICD-10-CM | POA: Diagnosis not present

## 2017-08-20 DIAGNOSIS — J9801 Acute bronchospasm: Secondary | ICD-10-CM | POA: Insufficient documentation

## 2017-08-20 DIAGNOSIS — Z79899 Other long term (current) drug therapy: Secondary | ICD-10-CM | POA: Insufficient documentation

## 2017-08-20 DIAGNOSIS — I2510 Atherosclerotic heart disease of native coronary artery without angina pectoris: Secondary | ICD-10-CM | POA: Insufficient documentation

## 2017-08-20 DIAGNOSIS — R062 Wheezing: Secondary | ICD-10-CM | POA: Diagnosis present

## 2017-08-20 DIAGNOSIS — Z794 Long term (current) use of insulin: Secondary | ICD-10-CM | POA: Diagnosis not present

## 2017-08-20 DIAGNOSIS — Z87891 Personal history of nicotine dependence: Secondary | ICD-10-CM | POA: Diagnosis not present

## 2017-08-20 DIAGNOSIS — E1122 Type 2 diabetes mellitus with diabetic chronic kidney disease: Secondary | ICD-10-CM | POA: Insufficient documentation

## 2017-08-20 DIAGNOSIS — N182 Chronic kidney disease, stage 2 (mild): Secondary | ICD-10-CM | POA: Diagnosis not present

## 2017-08-20 DIAGNOSIS — I130 Hypertensive heart and chronic kidney disease with heart failure and stage 1 through stage 4 chronic kidney disease, or unspecified chronic kidney disease: Secondary | ICD-10-CM | POA: Diagnosis not present

## 2017-08-20 MED ORDER — ALBUTEROL SULFATE HFA 108 (90 BASE) MCG/ACT IN AERS
2.0000 | INHALATION_SPRAY | RESPIRATORY_TRACT | Status: DC
  Administered 2017-08-20: 2 via RESPIRATORY_TRACT
  Filled 2017-08-20: qty 6.7

## 2017-08-20 NOTE — ED Provider Notes (Signed)
[facility] DEPT Provider Note   CSN: [id number] Arrival date & time: 08/20/17  1020     History   Chief Complaint Chief Complaint  Patient presents with  . Wheezing    HPI [name] is a 72 y.o. female.  72 year old female presents with acute onset of shortness of breath that occurred after she left the bathroom.  Does have history of COPD as well as CHF denies any angina or CHF type symptoms.  States that she was in a very warm building and that she became very anxious and thinks may have had a mild panic attack.  No cough or congestion.  No fever chills.  EMS called and patient given albuterol with Solu-Medrol and feels much better at this time.  Feels back to her baseline currently.     Past Medical History:  Diagnosis Date  . Breast cancer ([place])    breast - left   . Chronic combined systolic and diastolic CHF (congestive heart failure) (HCC) -Systolic Dysfunction resolved.    a. cMRI 5/10: EF 38% // b. Echo 3/13/: EF 50-55, Gr 1 DD // c. Echo 8/14: EF 40, inf/inf-septal HK, Gr 1 DD, mild MR // d. Echo 5/17: EF 40-45, inf HK, Gr 1 DD, mild MR, mild LAE, reduced RVSF, mild TR, PASP 33 // e. Echo 10/17: EF 40-45, Gr 2 DD, mild MR, severe LAE  . Coronary artery disease, non-occlusive    a. nonobs by LHC in 2010 // b. Myoview 10/17: Lg infarct apex, distal ant and lat walls, no ischemia, EF 38; int risk  // c. [place] 10/17:  dLAD 15, pLCx 30, mRCA 40, LVEDP 15  . Diabetes mellitus   . NICM (nonischemic cardiomyopathy) [facility])     Patient Active Problem List   Diagnosis Date Noted  . Preop cardiovascular exam 04/12/2017  . Hematoma   . FUO (fever of unknown origin)   . Acute CHF (congestive heart failure) ([place]) 05/06/2016  . Respiratory distress   . Hyperglycemia   . CKD (chronic kidney disease), stage II 03/17/2016  . Palpitation 03/17/2016  . Elevated troponin   . Dyslipidemia, goal to be determined   . Tobacco abuse   . COPD (chronic  obstructive pulmonary disease) ([place]) 06/28/2015  . Syncope 06/28/2015  . Chronic combined systolic and diastolic heart failure ([place]) 06/28/2015  . NICM (nonischemic cardiomyopathy) ([place]) 10/05/2012  . Abdominal mass, LLQ (left lower quadrant) 05/03/2012  . PULMONARY NODULE 08/05/2008  . Palpitations 08/05/2008  . Type 2 diabetes mellitus without complication, with long-term current use of insulin ([place]) 07/16/2008  . Hypertension 07/16/2008    Past Surgical History:  Procedure Laterality Date  . APPENDECTOMY    . BREAST LUMPECTOMY     lt breast  . CARDIAC CATHETERIZATION N/A 12/20/2015   Procedure: Left Heart Cath and Coronary Angiography;  Surgeon: Nelva Bush, MD;  Location: [facility] CV LAB;  Service: Cardiovascular;  Laterality: N/A;  . RIGHT HEART CATH N/A 05/08/2016   Procedure: Right Heart Cath;  Surgeon: Jolaine Artist, MD;  Location: [facility] CV LAB;  Service: Cardiovascular;  Laterality: N/A;  . TRANSTHORACIC ECHOCARDIOGRAM  11/2015   EF 40 and 45%.  GRII DD  . TRANSTHORACIC ECHOCARDIOGRAM  04/2016   EF 20% with grade 2 diastolic dysfunction  . TRANSTHORACIC ECHOCARDIOGRAM  10/2016   EF 55-60%.  Normal LV function.  Mild diastolic dysfunction.  Normal valve function  . TRIGGER FINGER RELEASE Left 04/23/2017   Procedure: RELEASE  TRIGGER FINGER TO LEFT FORTH FINGER;  Surgeon: Cristine Polio, MD;  Location: [facility];  Service: Plastics;  Laterality: Left;     OB History   None      Home Medications    Prior to Admission medications   Medication Sig Start Date End Date Taking? Authorizing Provider  atorvastatin (LIPITOR) 40 MG tablet Take 1 tablet (40 mg total) by mouth daily. 06/28/17 09/26/17  Leonie Man, MD  carvedilol (COREG) 6.25 MG tablet Take 1 tablet (6.25 mg total) by mouth 2 (two) times daily. 06/28/17   Leonie Man, MD  furosemide (LASIX) 40 MG tablet Take 1 tablet (40 mg total) by mouth 2 (two) times daily. 06/28/17    Leonie Man, MD  insulin NPH-regular Human (NOVOLIN 70/30) (70-30) 100 UNIT/ML injection Please start using 25 units in am and 15 units at bedtime. Check CBG's three times a day (including fasting in am) 03/18/16   Barton Dubois, MD  magnesium oxide (MAG-OX) 400 (241.3 Mg) MG tablet Take 1 tablet (400 mg total) by mouth 2 (two) times daily. Patient not taking: Reported on 05/15/2017 12/21/15   Cheryln Manly, NP  meclizine (ANTIVERT) 12.5 MG tablet Take 1 tablet (12.5 mg total) by mouth 3 (three) times daily as needed for dizziness. 05/15/17   Julianne Rice, MD  ondansetron (ZOFRAN) 4 MG tablet Take 1 tablet (4 mg total) by mouth every 6 (six) hours as needed for nausea or vomiting. 05/15/17   Julianne Rice, MD  sacubitril-valsartan (ENTRESTO) 49-51 MG Take 1 tablet by mouth 2 (two) times daily. 12/11/16   Bensimhon, Shaune Pascal, MD  spironolactone (ALDACTONE) 25 MG tablet Take 0.5 tablets (12.5 mg total) by mouth at bedtime. 06/28/17   Leonie Man, MD    Family History Family History  Problem Relation Age of Onset  . Stroke Father     Social History Social History   Tobacco Use  . Smoking status: Former Smoker    Years: 30.00    Last attempt to quit: 09/29/2012    Years since quitting: 4.8  . Smokeless tobacco: Never Used  Substance Use Topics  . Alcohol use: No  . Drug use: No     Allergies   Tape   Review of Systems Review of Systems  All other systems reviewed and are negative.    Physical Exam Updated Vital Signs There were no vitals taken for this visit.  Physical Exam  Constitutional: She is oriented to person, place, and time. She appears well-developed and well-nourished.  Non-toxic appearance. No distress.  HENT:  Head: Normocephalic and atraumatic.  Eyes: Pupils are equal, round, and reactive to light. Conjunctivae, EOM and lids are normal.  Neck: Normal range of motion. Neck supple. No tracheal deviation present. No thyroid mass present.    Cardiovascular: Normal rate, regular rhythm and normal heart sounds. Exam reveals no gallop.  No murmur heard. Pulmonary/Chest: Effort normal. No stridor. Tachypnea noted. No respiratory distress. She has decreased breath sounds. She has wheezes in the right lower field and the left lower field. She has no rhonchi. She has no rales.  Abdominal: Soft. Normal appearance and bowel sounds are normal. She exhibits no distension. There is no tenderness. There is no rebound and no CVA tenderness.  Musculoskeletal: Normal range of motion. She exhibits no edema or tenderness.  Neurological: She is alert and oriented to person, place, and time. She has normal strength. No cranial nerve deficit or sensory deficit. GCS eye  subscore is 4. GCS verbal subscore is 5. GCS motor subscore is 6.  Skin: Skin is warm and dry. No abrasion and no rash noted.  Psychiatric: She has a normal mood and affect. Her speech is normal and behavior is normal.  Nursing note and vitals reviewed.    ED Treatments / Results  Labs (all labs ordered are listed, but only abnormal results are displayed) Labs Reviewed - No data to display  EKG None  Radiology No results found.  Procedures Procedures (including critical care time)  Medications Ordered in ED Medications - No data to display   Initial Impression / Assessment and Plan / ED Course  I have reviewed the triage vital signs and the nursing notes.  Pertinent labs & imaging results that were available during my care of the patient were reviewed by me and considered in my medical decision making (see chart for details).     Patient given albuterol along with site Medrol feels better.  Suspect bronchospasm.  Does have remote history of COPD and will give an inhaler to go home with as well as instructions.  Final Clinical Impressions(s) / ED Diagnoses   Final diagnoses:  None    ED Discharge Orders    None       Lacretia Leigh, MD 08/20/17 1322

## 2017-08-20 NOTE — ED Notes (Signed)
Pt is currently on breathing treatment.  Will get Temp when completed.

## 2017-08-20 NOTE — ED Notes (Signed)
Bed: FV43 Expected date:  Expected time:  Means of arrival:  Comments: EMS-wheezing

## 2017-08-20 NOTE — Discharge Instructions (Addendum)
Use 1 to 2 puffs of inhaler every 4-6 hours as needed for any trouble breathing

## 2017-08-20 NOTE — ED Triage Notes (Addendum)
EMS reports bil wheezing 5 albuterol given followed by duo neb. Breathing much better on arrival. #22 Rt hand, 125mg  solmedrol gven, 12 Ld 105/44-63-16-100%

## 2017-09-21 ENCOUNTER — Ambulatory Visit: Payer: Medicare Other | Admitting: Cardiology

## 2017-11-26 ENCOUNTER — Ambulatory Visit (INDEPENDENT_AMBULATORY_CARE_PROVIDER_SITE_OTHER): Payer: Medicare Other | Admitting: Cardiology

## 2017-11-26 ENCOUNTER — Encounter: Payer: Self-pay | Admitting: Cardiology

## 2017-11-26 VITALS — BP 115/64 | HR 60 | Ht 63.5 in | Wt 162.2 lb

## 2017-11-26 DIAGNOSIS — I10 Essential (primary) hypertension: Secondary | ICD-10-CM

## 2017-11-26 DIAGNOSIS — E119 Type 2 diabetes mellitus without complications: Secondary | ICD-10-CM | POA: Diagnosis not present

## 2017-11-26 DIAGNOSIS — I5032 Chronic diastolic (congestive) heart failure: Secondary | ICD-10-CM

## 2017-11-26 DIAGNOSIS — R002 Palpitations: Secondary | ICD-10-CM

## 2017-11-26 DIAGNOSIS — I428 Other cardiomyopathies: Secondary | ICD-10-CM

## 2017-11-26 DIAGNOSIS — E785 Hyperlipidemia, unspecified: Secondary | ICD-10-CM | POA: Diagnosis not present

## 2017-11-26 DIAGNOSIS — Z794 Long term (current) use of insulin: Secondary | ICD-10-CM

## 2017-11-26 NOTE — Patient Instructions (Addendum)
Medication Instruction  No changes  If you need a refill on your cardiac medications before your next appointment, please call your pharmacy.   Lab work: cmp Lipid hgb A1C DO NOT EAT OR DRINK THE MORNING OF THE TEST. If you have labs (blood work) drawn today and your tests are completely normal, you will receive your results only by: [facility] Kitchen MyChart Message (if you have MyChart) OR . A paper copy in the mail If you have any lab test that is abnormal or we need to change your treatment, we will call you to review the results.  Testing/Pro  Follow-Up: At [facility], you and your health needs are our priority.  As part of our continuing mission to provide you with exceptional heart care, we have created designated Provider Care Teams.  These Care Teams include your primary Cardiologist (physician) and Advanced Practice Providers (APPs -  Physician Assistants and Nurse Practitioners) who all work together to provide you with the care you need, when you need it. You will need a follow up appointment in 6 months (April 2020).  Please call our office 2 months in advance to schedule this appointment.  You may see Glenetta Hew, MD or one of the following Advanced Practice Providers on your designated Care Team:   Rosaria Ferries, PA-C . Jory Sims, DNP, ANP  Any Other Special Instructions Will Be Listed Below (If Applicable

## 2017-11-26 NOTE — Progress Notes (Signed)
PCP: Patient, No Pcp Per  Clinic Note: Chief Complaint  Patient presents with  . Follow-up    Only concern is gaining weight    HPI: [name] is a 72 y.o. female with a h/o NICM (now resovled with EF ~55-60% by echo in 2018)  presents today for 6-8 month f/u.  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  Also tobacco abuse history.   36/1443 with A/C Systolic HF. Echo- EF 40-45%. Cath - mild to moderate, but non-obstructive CAD including (15% distal LAD stenosis, 30% ulcerated mid LCx disease, and 40% mid RCA stenosis).   05/05/16 through 05/08/16 with A/C Combined Heart Failure. ECHO EF down to 20%. RHC - -> well compensated filling pressures. Concern that PVCs may be a reason for low EF or possible Takotsubo. Also concerned about possible low output so bb was held.  Discharge weight was 162 pounds.    Last seen in [facility]  on December 11, 2016 by Dr. Haroldine Laws.  Was doing well just complaining of a little bit of fatigue.  Had not yet had a sleep study.  [name] was last seen in Feb 2019 -- doing well, no active Sx. Active routine exercise & walks dog.  --> d/c ASA.  Lasix daily w/ prn dose.  Recent Hospitalizations: None  Studies Personally Reviewed - (if available, images/films reviewed: From Epic Chart or Care Everywhere)  No new studies   Interval History: [name] presents here today doing well from a cardiac standpoint.  She just is upset because she is gained weight by overeating over the last year.  She is been under a lot of stress with work and social issues.  And just been eating randomly and not discretely. She had an episode back in March where she was dehydrated and pushed herself too much and had a near syncopal event.  Got much better after drinking some water and and getting  him to eat.  Has not had any further recurrences. She still takes intermittent doses of extra Lasix, but really is only been doing it because she is been concerned that her weights been going up.  Has not been more than 3 pounds a day but over time has gained weight.  She finally became convinced that she just was gaining weight and not fluid weight.  She is now adjusted her dry weight.  No PND, orthopnea and relatively well-controlled edema.  No chest pain or pressure with rest or exertion.  No real exertional dyspnea, although she has not been exactly what she used to be. Other than that one episode of near syncope, no rapid irregular heartbeats/palpitations, syncope/near syncope or TIA/amorous fugax.  She is hoping to try to get back into her exercise routine so she can lose some of this weight. No claudication.  ROS: A comprehensive was performed. Review of Systems  Constitutional: Negative for malaise/fatigue.  HENT: Negative for congestion and nosebleeds.   Eyes:       Right eye cataract  Respiratory: Negative for cough and shortness of breath.   Gastrointestinal: Negative for abdominal pain, blood in stool, heartburn and melena.  Genitourinary: Negative for hematuria.  Musculoskeletal: Negative for back pain and joint pain (Status post left fourth digit trigger finger surgery.).  Psychiatric/Behavioral: Negative for depression and memory loss. The patient is not nervous/anxious and  does not have insomnia.        Admits to lots of work and social stress.  All other systems reviewed and are negative.  I have reviewed and (if needed) personally updated the patient's problem list, medications, allergies, past medical and surgical history, social and family history.   Past Medical History:  Diagnosis Date  . Breast cancer ([place])    breast - left   . Chronic combined systolic and diastolic CHF (congestive heart failure) (HCC) -Systolic Dysfunction resolved.    a. cMRI 5/10: EF 38% // b.  Echo 3/13/: EF 50-55, Gr 1 DD // c. Echo 8/14: EF 40, inf/inf-septal HK, Gr 1 DD, mild MR // d. Echo 5/17: EF 40-45, inf HK, Gr 1 DD, mild MR, mild LAE, reduced RVSF, mild TR, PASP 33 // e. Echo 10/17: EF 40-45, Gr 2 DD, mild MR, severe LAE  . Coronary artery disease, non-occlusive    a. nonobs by LHC in 2010 // b. Myoview 10/17: Lg infarct apex, distal ant and lat walls, no ischemia, EF 38; int risk  // c. [place] 10/17:  dLAD 15, pLCx 30, mRCA 40, LVEDP 15  . Diabetes mellitus   . NICM (nonischemic cardiomyopathy) [facility])     Past Surgical History:  Procedure Laterality Date  . APPENDECTOMY    . BREAST LUMPECTOMY     lt breast  . CARDIAC CATHETERIZATION N/A 12/20/2015   Procedure: Left Heart Cath and Coronary Angiography;  Surgeon: Nelva Bush, MD:: Mild to moderate, nonobstructive coronary artery disease, including 15% distal LAD stenosis, 30% ulcerated mid LCx disease, and 40% mid RCA stenosis.  Upper normal left ventricular filling pressure (LVEDP 15 mmHg).  Marland Kitchen RIGHT HEART CATH N/A 05/08/2016   Procedure: Right Heart Cath;  Surgeon: Jolaine Artist, MD;  Location: [place] [facility];; RA = 3, RV = 34/5, PA = 38/12 (21), PCW = 12; Fick cardiac output/index = 4.6/2.6; PVR = 1.9 WU, Ao sat = 96%; PA sat = 58%, 62%  . TRANSTHORACIC ECHOCARDIOGRAM  10/'17; 3/'18   a) EF 40 and 45%.  GRII DD; b) 04/2016: EF 20% with grade 2 diastolic dysfunction  . TRANSTHORACIC ECHOCARDIOGRAM  10/2016   EF 55-60%.  Normal LV function.  Mild diastolic dysfunction.  Normal valve function  . TRIGGER FINGER RELEASE Left 04/23/2017   Procedure: RELEASE TRIGGER FINGER TO LEFT FORTH FINGER;  Surgeon: Cristine Polio, MD;  Location: [place];  Service: Plastics;  Laterality: Left;   Current Meds  Medication Sig  . carvedilol (COREG) 6.25 MG tablet Take 1 tablet (6.25 mg total) by mouth 2 (two) times daily.  . furosemide (LASIX) 40 MG tablet Take 1 tablet (40 mg total) by mouth 2 (two) times daily.    . insulin NPH-regular Human (NOVOLIN 70/30) (70-30) 100 UNIT/ML injection Please start using 25 units in am and 15 units at bedtime. Check CBG's three times a day (including fasting in am)  . sacubitril-valsartan (ENTRESTO) 49-51 MG Take 1 tablet by mouth 2 (two) times daily.  Marland Kitchen spironolactone (ALDACTONE) 25 MG tablet Take 0.5 tablets (12.5 mg total) by mouth at bedtime.    Allergies  Allergen Reactions  . Tape Rash    Pt can tolerate the paper tape    Social History   Tobacco Use  . Smoking status: Former Smoker    Years: 30.00    Last attempt to quit: 09/29/2012    Years since quitting: 5.1  . Smokeless tobacco: Never Used  Substance  Use Topics  . Alcohol use: No  . Drug use: No   Social History   Social History Narrative  . Not on file    family history includes Stroke in her father.  Wt Readings from Last 3 Encounters:  11/26/17 162 lb 3.2 oz (73.6 kg)  08/20/17 148 lb (67.1 kg)  04/23/17 153 lb 6.4 oz (69.6 kg)  @ home 148 lb.    PHYSICAL EXAM BP 115/64   Pulse 60   Ht 5' 3.5" (1.613 m)   Wt 162 lb 3.2 oz (73.6 kg)   BMI 28.28 kg/m  Physical Exam  Constitutional: She is oriented to person, place, and time. She appears well-developed and well-nourished. No distress.  Healthy-appearing.  Well-groomed  HENT:  Head: Normocephalic and atraumatic.  Neck: Normal range of motion. Neck supple. No hepatojugular reflux and no JVD present. Carotid bruit is not present.  Cardiovascular: Normal rate, regular rhythm and normal heart sounds. PMI is not displaced. Exam reveals no gallop and no friction rub.  No murmur heard. Pulmonary/Chest: Effort normal and breath sounds normal. No respiratory distress. She has no wheezes. She has no rales.  Abdominal: Soft. Bowel sounds are normal. She exhibits no distension. There is no tenderness.  Musculoskeletal: Normal range of motion. She exhibits no edema.  Neurological: She is alert and oriented to person, place, and time.   Psychiatric: She has a normal mood and affect. Her behavior is normal. Judgment and thought content normal.  Nursing note and vitals reviewed.    Adult ECG Report  Rate: 59 ;  Rhythm: sinus bradycardia and Left axis deviation (-32).  Otherwise normal intervals, durations.;   Narrative Interpretation: Relatively normal EKG.   Other studies Reviewed: Additional studies/ records that were reviewed today include:  Recent Labs:    Lab Results  Component Value Date   CREATININE 1.59 (H) 05/15/2017   BUN 34 (H) 05/15/2017   NA 138 05/15/2017   K 3.8 05/15/2017   CL 101 05/15/2017   CO2 27 05/15/2017   Lab Results  Component Value Date   CHOL 181 12/17/2015   HDL 64 12/17/2015   LDLCALC 88 12/17/2015   TRIG 143 12/17/2015   CHOLHDL 2.8 12/17/2015    ASSESSMENT / PLAN: Problem List Items Addressed This Visit    Chronic diastolic heart failure (HCC) (Chronic)    Systolic dysfunction has pretty much resolved.  Now she has preserved EF by echo.  Still has mild volume overload from some diastolic dysfunction, but suspected this is also pretty much normalized.  Continue Entresto and carvedilol however since she has had recurrence of her reduced EF after initially improving the first time.  She is on standing dose of Lasix and only occasionally taking as needed dose of additional Lasix.  At  She is concerned about her weight gain, I do not think this is related to her heart failure. She is also on spironolactone, and depending on how her blood pressures go, we may be able to stop this.      Relevant Orders   Comprehensive metabolic panel   Hemoglobin A1c   Dyslipidemia, goal to be determined (Chronic)    Has not had labs checked in 2 years as I can see.  Will check lipid panel chemistry panel as well as A1c since she has diabetes as well.  He does not seem like she has been seen by PCP recently. Currently on atorvastatin.  For now no change until we see the results of her  labs.   Looked good in October 2017.      Relevant Orders   Lipid panel   Comprehensive metabolic panel   Hypertension (Chronic)    Blood pressure looks great on current meds.  No change.      NICM (nonischemic cardiomyopathy) ([place]) - Primary (Chronic)    Seems to have resolved with normalization of EF by most recent echo.  Class I heart failure symptoms now. However, since she has improved while on Entresto and carvedilol, I will continue Entresto.  We may be able to discontinue spironolactone.  Monitor volume status, but seems euvolemic as well  -> could potentially reduce Lasix dose.      Palpitations (Chronic)    Well-controlled on current dose carvedilol      Type 2 diabetes mellitus without complication, with long-term current use of insulin (HCC)   Relevant Orders   Hemoglobin A1c      Current medicines are reviewed at length with the patient today. (+/- concerns) none The following changes have been made:None  Patient Instructions  Medication Instruction  No changes  If you need a refill on your cardiac medications before your next appointment, please call your pharmacy.   Lab work: cmp Lipid hgb A1C DO NOT EAT OR DRINK THE MORNING OF THE TEST. If you have labs (blood work) drawn today and your tests are completely normal, you will receive your results only by: Marland Kitchen MyChart Message (if you have MyChart) OR . A paper copy in the mail If you have any lab test that is abnormal or we need to change your treatment, we will call you to review the results.  Testing/Pro  Follow-Up: At [facility], you and your health needs are our priority.  As part of our continuing mission to provide you with exceptional heart care, we have created designated Provider Care Teams.  These Care Teams include your primary Cardiologist (physician) and Advanced Practice Providers (APPs -  Physician Assistants and Nurse Practitioners) who all work together to provide you with the care you need,  when you need it. You will need a follow up appointment in 6 months (April 2020).  Please call our office 2 months in advance to schedule this appointment.  You may see Glenetta Hew, MD or one of the following Advanced Practice Providers on your designated Care Team:   Rosaria Ferries, PA-C . Jory Sims, DNP, ANP  Any Other Special Instructions Will Be Listed Below (If Applicable     Studies Ordered:   Orders Placed This Encounter  Procedures  . Lipid panel  . Comprehensive metabolic panel  . Hemoglobin A1c      Glenetta Hew, M.D., M.S. Interventional Cardiologist   Pager # [phone] Phone # [phone] [address] [place], [place] [address]   Thank you for choosing Heartcare at [facility]!!

## 2017-11-27 ENCOUNTER — Encounter: Payer: Self-pay | Admitting: Cardiology

## 2017-11-27 NOTE — Assessment & Plan Note (Signed)
Seems to have resolved with normalization of EF by most recent echo.  Class I heart failure symptoms now. However, since she has improved while on Entresto and carvedilol, I will continue Entresto.  We may be able to discontinue spironolactone.  Monitor volume status, but seems euvolemic as well  -> could potentially reduce Lasix dose.

## 2017-11-27 NOTE — Assessment & Plan Note (Addendum)
Systolic dysfunction has pretty much resolved.  Now she has preserved EF by echo.  Still has mild volume overload from some diastolic dysfunction, but suspected this is also pretty much normalized.  Continue Entresto and carvedilol however since she has had recurrence of her reduced EF after initially improving the first time.  She is on standing dose of Lasix and only occasionally taking as needed dose of additional Lasix.  At  She is concerned about her weight gain, I do not think this is related to her heart failure. She is also on spironolactone, and depending on how her blood pressures go, we may be able to stop this.

## 2017-11-27 NOTE — Assessment & Plan Note (Signed)
Well-controlled on current dose carvedilol

## 2017-11-27 NOTE — Assessment & Plan Note (Signed)
Has not had labs checked in 2 years as I can see.  Will check lipid panel chemistry panel as well as A1c since she has diabetes as well.  He does not seem like she has been seen by PCP recently. Currently on atorvastatin.  For now no change until we see the results of her labs.  Looked good in October 2017.

## 2017-11-27 NOTE — Assessment & Plan Note (Signed)
Blood pressure looks great on current meds.  No change 

## 2017-12-10 ENCOUNTER — Other Ambulatory Visit: Payer: Self-pay | Admitting: Cardiology

## 2017-12-10 MED ORDER — SACUBITRIL-VALSARTAN 49-51 MG PO TABS: 1.0000 | ORAL_TABLET | Freq: Two times a day (BID) | ORAL | 3 refills | Status: DC

## 2017-12-10 NOTE — Telephone Encounter (Signed)
Patient walked in requesting refill of entresto 49/51 - "need refill Rx sent to mail order office" and "fax details 'should be in my chart'"  Rx(s) sent to pharmacy electronically to [facility].  Notice of refill sent to patient via MyChart

## 2018-05-29 ENCOUNTER — Telehealth: Payer: Self-pay

## 2018-05-29 NOTE — Telephone Encounter (Signed)
Called and left a voice message for the patient about her 4/17 appointment and switching it to a telehealth visit

## 2018-05-30 ENCOUNTER — Telehealth: Payer: Self-pay

## 2018-05-30 NOTE — Telephone Encounter (Signed)
Patient returned call, please call back  

## 2018-05-30 NOTE — Telephone Encounter (Signed)
Virtual Visit Pre-Appointment Phone Call  Steps For Call:  1. Confirm consent - "In the setting of the current Covid19 crisis, you are scheduled for a (phone or video) visit with your provider on (date) at (time).  Just as we do with many in-office visits, in order for you to participate in this visit, we must obtain consent.  If you'd like, I can send this to your mychart (if signed up) or email for you to review.  Otherwise, I can obtain your verbal consent now.  All virtual visits are billed to your insurance company just like a normal visit would be.  By agreeing to a virtual visit, we'd like you to understand that the technology does not allow for your provider to perform an examination, and thus may limit your provider's ability to fully assess your condition.  Finally, though the technology is pretty good, we cannot assure that it will always work on either your or our end, and in the setting of a video visit, we may have to convert it to a phone-only visit.  In either situation, we cannot ensure that we have a secure connection.  Are you willing to proceed?"  2. Give patient instructions for WebEx download to smartphone as below if video visit  3. Advise patient to be prepared with any vital sign or heart rhythm information, their current medicines, and a piece of paper and pen handy for any instructions they may receive the day of their visit  4. Inform patient they will receive a phone call 15 minutes prior to their appointment time (may be from unknown caller ID) so they should be prepared to answer  5. Confirm that appointment type is correct in Epic appointment notes (video vs telephone)    TELEPHONE CALL NOTE  [name] has been deemed a candidate for a follow-up tele-health visit to limit community exposure during the Covid-19 pandemic. I spoke with the patient via phone to ensure availability of phone/video source, confirm preferred email & phone number, and discuss  instructions and expectations.  I reminded [name] to be prepared with any vital sign and/or heart rhythm information that could potentially be obtained via home monitoring, at the time of her visit. I reminded [name] to expect a phone call at the time of her visit if her visit.  Did the patient verbally acknowledge consent to treatment? YES  [name], [place] 05/30/2018 4:47 PM   DOWNLOADING THE [name], go to [employer] and type in WebEx in the search bar. [place] [employer], the blue/green circle. The app is free but as with any other app downloads, their phone may require them to verify saved payment information or Apple password. The patient does NOT have to create an account.  - If Android, ask patient to go to [employer] and type in WebEx in the search bar. [place] [employer], the blue/green circle. The app is free but as with any other app downloads, their phone may require them to verify saved payment information or Android password. The patient does NOT have to create an account.   CONSENT FOR TELE-HEALTH VISIT - PLEASE REVIEW  I hereby voluntarily request, consent and authorize [facility] and its employed or contracted physicians, physician assistants, nurse practitioners or other licensed health care professionals (the Practitioner), to provide me with telemedicine health care services (the "Services") as deemed necessary by the treating Practitioner. I  acknowledge and consent to receive the Services by the Practitioner via telemedicine. I understand that the telemedicine visit will involve communicating with the Practitioner through live audiovisual communication technology and the disclosure of certain medical information by electronic transmission. I acknowledge that I have been given the opportunity to request an in-person assessment or other available alternative prior to the telemedicine visit and am  voluntarily participating in the telemedicine visit.  I understand that I have the right to withhold or withdraw my consent to the use of telemedicine in the course of my care at any time, without affecting my right to future care or treatment, and that the Practitioner or I may terminate the telemedicine visit at any time. I understand that I have the right to inspect all information obtained and/or recorded in the course of the telemedicine visit and may receive copies of available information for a reasonable fee.  I understand that some of the potential risks of receiving the Services via telemedicine include:  Marland Kitchen Delay or interruption in medical evaluation due to technological equipment failure or disruption; . Information transmitted may not be sufficient (e.g. poor resolution of images) to allow for appropriate medical decision making by the Practitioner; and/or  . In rare instances, security protocols could fail, causing a breach of personal health information.  Furthermore, I acknowledge that it is my responsibility to provide information about my medical history, conditions and care that is complete and accurate to the best of my ability. I acknowledge that Practitioner's advice, recommendations, and/or decision may be based on factors not within their control, such as incomplete or inaccurate data provided by me or distortions of diagnostic images or specimens that may result from electronic transmissions. I understand that the practice of medicine is not an exact science and that Practitioner makes no warranties or guarantees regarding treatment outcomes. I acknowledge that I will receive a copy of this consent concurrently upon execution via email to the email address I last provided but may also request a printed copy by calling the office of [place].    I understand that my insurance will be billed for this visit.   I have read or had this consent read to me. . I understand the  contents of this consent, which adequately explains the benefits and risks of the Services being provided via telemedicine.  . I have been provided ample opportunity to ask questions regarding this consent and the Services and have had my questions answered to my satisfaction. . I give my informed consent for the services to be provided through the use of telemedicine in my medical care  By participating in this telemedicine visit I agree to the above.

## 2018-05-30 NOTE — Telephone Encounter (Signed)
Returned call to patient. [name] was calling back to let me know she would switch her appointment to a virtual telephone visit. Went over consent, also asked about having a blood pressure machine and a scale patient stated she has both. I informed her that someone will give her a call 15-20 minutes before her scheduled time to go over her vitals, allergies and medications. Patient verbalized an understanding and all (if any) questions were answered.

## 2018-06-06 ENCOUNTER — Telehealth: Payer: Self-pay | Admitting: Cardiology

## 2018-06-06 NOTE — Telephone Encounter (Signed)
Smart phone/no mychart/pre reg complete/dc/04.16.2020

## 2018-06-07 ENCOUNTER — Telehealth (INDEPENDENT_AMBULATORY_CARE_PROVIDER_SITE_OTHER): Payer: Medicare Other | Admitting: Cardiology

## 2018-06-07 ENCOUNTER — Encounter: Payer: Self-pay | Admitting: Cardiology

## 2018-06-07 VITALS — BP 125/61 | Ht 63.0 in | Wt 143.0 lb

## 2018-06-07 DIAGNOSIS — R002 Palpitations: Secondary | ICD-10-CM

## 2018-06-07 DIAGNOSIS — E785 Hyperlipidemia, unspecified: Secondary | ICD-10-CM | POA: Diagnosis not present

## 2018-06-07 DIAGNOSIS — I10 Essential (primary) hypertension: Secondary | ICD-10-CM

## 2018-06-07 DIAGNOSIS — Z79899 Other long term (current) drug therapy: Secondary | ICD-10-CM

## 2018-06-07 DIAGNOSIS — I5032 Chronic diastolic (congestive) heart failure: Secondary | ICD-10-CM

## 2018-06-07 DIAGNOSIS — I428 Other cardiomyopathies: Secondary | ICD-10-CM

## 2018-06-07 NOTE — Assessment & Plan Note (Signed)
Has not had labs checked.  Was going to have been checked before this visit, but clearly since it was not an in person visit, we will have to wait till her next follow-up.  I will just simply have her come in for a morning visit in October during which time we will check a fasting lipid panel and chemistry panel.

## 2018-06-07 NOTE — Assessment & Plan Note (Signed)
Blood pressure looks great on Entresto, spironolactone and carvedilol.  No change for now.

## 2018-06-07 NOTE — Assessment & Plan Note (Signed)
Thankfully, she is 1 of the left TAVR patients who had a near full recovery of her systolic function on echo.  Now only class I symptoms of diastolic heart failure. Since she had a notable improvement on Entresto and carvedilol along with spironolactone, we will continue with that regimen.  Also standing dose of Lasix with PRN if necessary.  She needs her Entresto paperwork filled out which we will do. Despite her recovery of function, I think it is still warranted to stay on Entresto since that was part of her treatment regimen that led to the improvement in EF.

## 2018-06-07 NOTE — Patient Instructions (Addendum)
Medication Instructions:   No changes OK to take additional Lasix for swelling or wgt gain ?> 3 lb. (otherwise just take 1 tab daily)  Will have Ivin Booty contact you next week to get the [facility] form information -- Dr. Ellyn Hack will sign when in the Office on April 29th.   If you need a refill on your cardiac medications before your next appointment, please call your pharmacy.   Lab work: FASTING LIPID PANEL & CHEMISTRY PANEL in Oct 2020 on AM day of f/u visit in Oct (come in fasting )  If you have labs (blood work) drawn today and your tests are completely normal, you will receive your results only by: Marland Kitchen MyChart Message (if you have MyChart) OR . A paper copy in the mail If you have any lab test that is abnormal or we need to change your treatment, we will call you to review the results.  Testing/Procedures: None  Follow-Up: At [facility], you and your health needs are our priority.  As part of our continuing mission to provide you with exceptional heart care, we have created designated Provider Care Teams.  These Care Teams include your primary Cardiologist (physician) and Advanced Practice Providers (APPs -  Physician Assistants and Nurse Practitioners) who all work together to provide you with the care you need, when you need it. You will need a follow up appointment in October (AM visit) .  Please call our office 2 months in advance to schedule this appointment.  You may see Glenetta Hew, MD or one of the following Advanced Practice Providers on your designated Care Team:   Rosaria Ferries, PA-C . Jory Sims, DNP, ANP

## 2018-06-07 NOTE — Assessment & Plan Note (Signed)
Very otherwise she is doing amazingly well.  She is not really having any heart failure symptoms at all.  No PND orthopnea or edema.  She is now taking once daily Lasix as opposed to twice daily, and is not having to use any additional dosing. She has lost quite a bit of weight since I last saw her and is staying very active. She remains on Entresto and carvedilol.  We will need to fill out her Entresto patient assistance package (will try to get this arranged so that I can sign it when I am in the office on April 29).  Thankfully, it seems like her weight that she was worried about in October was clearly just regaining weight from dietary indiscretion and lack of exercise.  Now that she is watching her diet and exercising, she has lost all the weight back down to 143 pounds from 167.  I really congratulated her on this effort.  Otherwise no changes.

## 2018-06-07 NOTE — Progress Notes (Signed)
Virtual Visit via Telephone Note   This visit type was conducted due to national recommendations for restrictions regarding the COVID-19 Pandemic (e.g. social distancing) in an effort to limit this patient's exposure and mitigate transmission in our community.  Due to her co-morbid illnesses, this patient is at least at moderate risk for complications without adequate follow up.  This format is felt to be most appropriate for this patient at this time.  The patient did not have access to video technology/had technical difficulties with video requiring transitioning to audio format only (telephone).  All issues noted in this document were discussed and addressed.  No physical exam could be performed with this format.  Please refer to the patient's chart for her  consent to telehealth for [facility].   Patient has given verbal permission to conduct this visit via virtual appointment and to bill insurance 05/30/2018 4:50 PM   -- does not have functioning Smartphone with camera for video call.   Evaluation Performed:  Follow-up visit  Date:  06/07/2018   ID:  Ashley, Cardenas [birth year], MRN [id number]  Patient Location: Home Provider Location: Home  PCP:  Patient, No Pcp Per  Cardiologist:  Glenetta Hew, MD  -formally seen in the heart failure clinic Electrophysiologist:  None   Chief Complaint:  6 month f/u  History of Present Illness:    [name] is a 73 y.o. female with PMH notable for NICM (now resolved) who presents via audio/video conferencing for a telehealth visit today as her ~6 month f/u.  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  Also tobacco abuse history.   28/3151 with A/C Systolic HF. Echo- EF 40-45%. Cath - mild to moderate, but non-obstructive CAD including (15% distal LAD stenosis, 30%  ulcerated mid LCx disease, and 40% mid RCA stenosis).   05/05/16 through 05/08/16 with A/C Combined Heart Failure. ECHO EF down to 20%. RHC - -> well compensated filling pressures. Concern that PVCs may be a reason for low EF or possible Takotsubo. Also concerned about possible low output so bb was held. Discharge weight was 162 pounds.    Last seen in [facility]  on December 11, 2016 by Dr. Haroldine Laws.  Was doing well just complaining of a little bit of fatigue.  Had not yet had a sleep study.   [name] was last seen November 26, 2017 --doing well from a car standpoint.  Had gained some weight from overeating.  Under lots of social stress.  Eating randomly.  Near syncopal event in March 2019 related to dehydration. --Taking intermittent random doses of Lasix as needed --Was hoping to lose weight.  Remained on Entresto and carvedilol with standing and PRN Lasix  (needs Entrestro patient assistance paperwork signed by May 14th)  Interval History:  [name] is doing well.  Staying active - but getting bored with quarantining.   Walks 3 x / day -- 1-2 blocks & does some light weights. 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  Wgt down 20 lb with diet & exercise.  Clothes fit better.  Better shape than she has been in years. Not taking extra Lasix.   The patient does not have symptoms concerning for COVID-19 infection (fever, chills, cough, or new shortness of breath).  The patient is practicing social distancing.   Gets groceries or medications using drive through - or daughter gets for her.   ROS:  Please see the history of present illness.    Review of Systems  Constitutional: Positive for weight  loss (intentional). Negative for chills, fever and malaise/fatigue.  HENT: Negative for congestion and nosebleeds.   Respiratory: Negative.   Gastrointestinal: Negative for blood in stool, diarrhea, melena, nausea and vomiting.  Genitourinary: Negative for hematuria.  Musculoskeletal: Negative for falls.  Neurological: Negative for dizziness.  Endo/Heme/Allergies: Negative for environmental allergies.  Psychiatric/Behavioral: Negative.     Past Medical History:  Diagnosis Date  . Breast cancer ([place])    breast - left   . Chronic combined systolic and diastolic CHF (congestive heart failure) (HCC) -Systolic Dysfunction resolved.    a. cMRI 5/10: EF 38% // b. Echo 3/13/: EF 50-55, Gr 1 DD // c. Echo 8/14: EF 40, inf/inf-septal HK, Gr 1 DD, mild MR // d. Echo 5/17: EF 40-45, inf HK, Gr 1 DD, mild MR, mild LAE, reduced RVSF, mild TR, PASP 33 // e. Echo 10/17: EF 40-45, Gr 2 DD, mild MR, severe LAE  . Coronary artery disease, non-occlusive    a. nonobs by LHC in 2010 // b. Myoview 10/17: Lg infarct apex, distal ant and lat walls, no ischemia, EF 38; int risk  // c. [place] 10/17:  dLAD 15, pLCx 30, mRCA 40, LVEDP 15  . Diabetes mellitus   . NICM (nonischemic cardiomyopathy) [facility])    Past Surgical History:  Procedure Laterality Date  . APPENDECTOMY    . BREAST LUMPECTOMY     lt breast  . CARDIAC CATHETERIZATION N/A 12/20/2015   Procedure: Left Heart Cath and Coronary Angiography;  Surgeon: Nelva Bush, MD:: Mild to moderate, nonobstructive coronary artery disease, including 15% distal LAD stenosis, 30% ulcerated mid LCx disease, and 40% mid RCA stenosis.  Upper normal left ventricular filling pressure (LVEDP 15 mmHg).  Marland Kitchen RIGHT HEART CATH N/A 05/08/2016   Procedure: Right Heart Cath;  Surgeon: Jolaine Artist, MD;  Location: [facility];; RA = 3, RV = 34/5, PA = 38/12 (21), PCW = 12; Fick cardiac output/index = 4.6/2.6; PVR = 1.9 WU, Ao sat = 96%; PA sat = 58%, 62%  . TRANSTHORACIC  ECHOCARDIOGRAM  10/'17; 3/'18   a) EF 40 and 45%.  GRII DD; b) 04/2016: EF 20% with grade 2 diastolic dysfunction  . TRANSTHORACIC ECHOCARDIOGRAM  10/2016   EF 55-60%.  Normal LV function.  Mild diastolic dysfunction.  Normal valve function  . TRIGGER FINGER RELEASE Left 04/23/2017   Procedure: RELEASE TRIGGER FINGER TO LEFT FORTH FINGER;  Surgeon: Cristine Polio, MD;  Location: [facility];  Service: Plastics;  Laterality: Left;     Current Meds  Medication Sig  . atorvastatin (LIPITOR) 40 MG tablet Take 1 tablet (40 mg total) by mouth daily.  . carvedilol (COREG) 6.25 MG tablet Take 1 tablet (6.25 mg total) by mouth 2 (two) times daily.  . furosemide (LASIX) 40 MG tablet Take 1 tablet (40 mg total) by mouth 2 (two) times daily.  . insulin NPH-regular Human (NOVOLIN 70/30) (70-30) 100 UNIT/ML injection Please start using 25 units in am and 15 units at bedtime. Check CBG's three times a day (  including fasting in am)  . sacubitril-valsartan (ENTRESTO) 49-51 MG Take 1 tablet by mouth 2 (two) times daily.  Marland Kitchen spironolactone (ALDACTONE) 25 MG tablet Take 0.5 tablets (12.5 mg total) by mouth at bedtime.   -- needs patient assistance form signed.  Taking Lasix 1 x day -- & PRN extra: not being dehydrated.  Allergies:   Tape   Social History   Tobacco Use  . Smoking status: Former Smoker    Years: 30.00    Last attempt to quit: 09/29/2012    Years since quitting: 5.6  . Smokeless tobacco: Never Used  Substance Use Topics  . Alcohol use: No  . Drug use: No     Family Hx: The patient's family history includes Stroke in her father.   Prior CV studies:   The following studies were reviewed today: . No new studies:  Labs/Other Tests and Data Reviewed:    EKG:  No ECG reviewed.  Recent Labs: No results found for requested labs within last 8760 hours.   Lab Results  Component Value Date   HGBA1C 10.9 (H) 10/11/2016    Recent Lipid Panel -- did not get checked.  Plan to check in Oct 2020.  Lab Results  Component Value Date/Time   CHOL 181 12/17/2015 12:40 AM   TRIG 143 12/17/2015 12:40 AM   HDL 64 12/17/2015 12:40 AM   CHOLHDL 2.8 12/17/2015 12:40 AM   LDLCALC 88 12/17/2015 12:40 AM    Wt Readings from Last 3 Encounters:  06/07/18 143 lb (64.9 kg)  11/26/17 162 lb 3.2 oz (73.6 kg)  08/20/17 148 lb (67.1 kg)     Objective:    Vital Signs:  BP 125/61   Ht 5\' 3"  (1.6 m)   Wt 143 lb (64.9 kg)   BMI 25.33 kg/m   Visual examination VITAL SIGNS:  reviewed GEN:  no acute distress RESPIRATORY:  normal respiratory effort, symmetric expansion NEURO:  alert and oriented x 3, no obvious focal deficit PSYCH:  normal affect   ASSESSMENT & PLAN:    Problem List Items Addressed This Visit    Chronic diastolic heart failure (HCC) (Chronic)    Very otherwise she is doing amazingly well.  She is not really having any heart failure symptoms at all.  No PND orthopnea or edema.  She is now taking once daily Lasix as opposed to twice daily, and is not having to use any additional dosing. She has lost quite a bit of weight since I last saw her and is staying very active. She remains on Entresto and carvedilol.  We will need to fill out her Entresto patient assistance package (will try to get this arranged so that I can sign it when I am in the office on April 29).  Thankfully, it seems like her weight that she was worried about in October was clearly just regaining weight from dietary indiscretion and lack of exercise.  Now that she is watching her diet and exercising, she has lost all the weight back down to 143 pounds from 167.  I really congratulated her on this effort.  Otherwise no changes.      Dyslipidemia, goal to be determined - Primary (Chronic)    Has not had labs checked.  Was going to have been checked before this visit, but clearly since it was not an in person visit, we will have to wait till her next follow-up.  I will just simply  have her come in for a morning visit  in October during which time we will check a fasting lipid panel and chemistry panel.      Relevant Orders   Lipid panel   Comprehensive metabolic panel   Essential hypertension (Chronic)    Blood pressure looks great on Entresto, spironolactone and carvedilol.  No change for now.      NICM (nonischemic cardiomyopathy) ([place]) (Chronic)    Thankfully, she is 1 of the left TAVR patients who had a near full recovery of her systolic function on echo.  Now only class I symptoms of diastolic heart failure. Since she had a notable improvement on Entresto and carvedilol along with spironolactone, we will continue with that regimen.  Also standing dose of Lasix with PRN if necessary.  She needs her Entresto paperwork filled out which we will do. Despite her recovery of function, I think it is still warranted to stay on Entresto since that was part of her treatment regimen that led to the improvement in EF.      Palpitations (Chronic)    Pretty much asymptomatic on her current dose of beta-blocker.       Other Visit Diagnoses    Medication management       Relevant Orders   Lipid panel   Comprehensive metabolic panel      [id number] Education: The signs and symptoms of COVID-19 were discussed with the patient and how to seek care for testing (follow up with PCP or arrange E-visit).   The importance of social distancing was discussed today.  Time:   Today, I have spent 18 minutes with the patient with telehealth technology discussing the above problems.  Additional ~10 min for chart review & charting.   Medication Adjustments/Labs and Tests Ordered: Current medicines are reviewed at length with the patient today.  Concerns regarding medicines are outlined above.  Medication Instructions:  No changes OK to take additional Lasix for swelling or wgt gain ?> 3 lb. (otherwise just take 1 tab daily)  Will have Ivin Booty contact you next week to get the  [facility] form information -- Dr. Ellyn Hack will sign when in the Office on April 29th.  Tests Ordered: Orders Placed This Encounter  Procedures  . Lipid panel  . Comprehensive metabolic panel  FLP/CMP in Oct 2020 on day of f/u visit   Medication Changes: No orders of the defined types were placed in this encounter.   Disposition:  Follow up in 6 month(s)    Signed, Glenetta Hew, MD  06/07/2018 4:48 PM    [place]

## 2018-06-07 NOTE — Assessment & Plan Note (Signed)
Pretty much asymptomatic on her current dose of beta-blocker.

## 2018-07-08 ENCOUNTER — Telehealth: Payer: Self-pay | Admitting: Cardiology

## 2018-07-08 ENCOUNTER — Other Ambulatory Visit: Payer: Self-pay | Admitting: Cardiology

## 2018-07-08 NOTE — Telephone Encounter (Signed)
New Message   Patient is calling because she dropped of some pairs to have Dr. Ellyn Hack fill out for patient assistance. She is checking to see if they are ready or can be faxed to [employer]. Please call to discuss.

## 2018-07-08 NOTE — Telephone Encounter (Signed)
Spoke with patient and advised Dr Ellyn Hack signed today. Faxed to patient assistance.

## 2018-07-10 ENCOUNTER — Encounter: Payer: Self-pay | Admitting: Cardiology

## 2018-07-10 NOTE — Telephone Encounter (Signed)
Error. No note needed  

## 2018-09-09 LAB — HM DIABETES EYE EXAM

## 2018-09-13 ENCOUNTER — Other Ambulatory Visit: Payer: Self-pay | Admitting: Cardiology

## 2018-09-16 ENCOUNTER — Other Ambulatory Visit: Payer: Self-pay | Admitting: Cardiology

## 2018-09-18 MED ORDER — ATORVASTATIN CALCIUM 40 MG PO TABS: 40.0000 mg | ORAL_TABLET | Freq: Every day | ORAL | 5 refills | Status: DC

## 2018-09-18 MED ORDER — FUROSEMIDE 40 MG PO TABS: 40.0000 mg | ORAL_TABLET | Freq: Two times a day (BID) | ORAL | 5 refills | Status: DC

## 2018-09-18 NOTE — Addendum Note (Signed)
Addended byBarry Brunner on: 09/18/2018 10:47 AM   Modules accepted: Orders

## 2018-10-17 ENCOUNTER — Other Ambulatory Visit: Payer: Self-pay | Admitting: Cardiology

## 2018-10-21 ENCOUNTER — Other Ambulatory Visit: Payer: Self-pay

## 2018-10-21 MED ORDER — CARVEDILOL 6.25 MG PO TABS: 6.2500 mg | ORAL_TABLET | Freq: Two times a day (BID) | ORAL | 1 refills | Status: DC

## 2018-12-04 ENCOUNTER — Ambulatory Visit: Payer: Medicare Other | Admitting: Cardiology

## 2018-12-30 ENCOUNTER — Other Ambulatory Visit: Payer: Self-pay | Admitting: Cardiology

## 2019-01-03 ENCOUNTER — Other Ambulatory Visit: Payer: Self-pay | Admitting: Cardiology

## 2019-01-03 ENCOUNTER — Ambulatory Visit: Payer: Medicare Other | Admitting: Cardiology

## 2019-01-03 NOTE — Telephone Encounter (Signed)
New Message  *STAT* If patient is at the pharmacy, call can be transferred to refill team.   1. Which medications need to be refilled? (please list name of each medication and dose if known)  carvedilol (COREG) 6.25 MG tablet  2. Which pharmacy/location (including street and city if local pharmacy) is medication to be sent to?[place], [place]  3. Do they need a 30 day or 90 day supply?  30 Day

## 2019-01-08 MED ORDER — CARVEDILOL 6.25 MG PO TABS: 6.2500 mg | ORAL_TABLET | Freq: Two times a day (BID) | ORAL | 5 refills | Status: DC

## 2019-01-08 NOTE — Telephone Encounter (Signed)
Rx(s) sent to pharmacy electronically.  

## 2019-01-09 ENCOUNTER — Encounter: Payer: Self-pay | Admitting: Cardiology

## 2019-01-09 ENCOUNTER — Ambulatory Visit (INDEPENDENT_AMBULATORY_CARE_PROVIDER_SITE_OTHER): Payer: Medicare Other | Admitting: Cardiology

## 2019-01-09 ENCOUNTER — Other Ambulatory Visit: Payer: Self-pay

## 2019-01-09 VITALS — BP 124/66 | HR 77 | Temp 97.5°F | Ht 63.5 in | Wt 155.0 lb

## 2019-01-09 DIAGNOSIS — I10 Essential (primary) hypertension: Secondary | ICD-10-CM | POA: Diagnosis not present

## 2019-01-09 DIAGNOSIS — I428 Other cardiomyopathies: Secondary | ICD-10-CM

## 2019-01-09 DIAGNOSIS — E1169 Type 2 diabetes mellitus with other specified complication: Secondary | ICD-10-CM | POA: Diagnosis not present

## 2019-01-09 DIAGNOSIS — Z794 Long term (current) use of insulin: Secondary | ICD-10-CM | POA: Diagnosis not present

## 2019-01-09 DIAGNOSIS — I5032 Chronic diastolic (congestive) heart failure: Secondary | ICD-10-CM | POA: Diagnosis not present

## 2019-01-09 DIAGNOSIS — E119 Type 2 diabetes mellitus without complications: Secondary | ICD-10-CM | POA: Diagnosis not present

## 2019-01-09 DIAGNOSIS — N182 Chronic kidney disease, stage 2 (mild): Secondary | ICD-10-CM

## 2019-01-09 DIAGNOSIS — E785 Hyperlipidemia, unspecified: Secondary | ICD-10-CM

## 2019-01-09 NOTE — Progress Notes (Signed)
Primary Care Provider: Patient, No Pcp Per Cardiologist: Glenetta Hew, MD Electrophysiologist:   Clinic Note: Chief Complaint  Patient presents with  . Follow-up    6 months.  . Cardiomyopathy    Nonischemic, essentially improved EF.    HPI:    [name] is a 73 y.o. female with a history of resolved NONISCHEMIC CARDIOMYOPATHY/CHRONIC COMBINED SYSTOLIC AND DIASTOLIC HEART FAILURE along with poorly controlled diabetes and hypertension who presents today for delayed 26-month follow-up.   11/2015: Admitted for A/C combined CHF   Echo: EF 40 to 45%.    Cath:  Mild to moderate nonobstructive disease.  Borderline elevated LVEDP.  04/25/2016: Admitted again for AV combined CHF  March 2018 echo with a EF of 20%, GRII DD. -->  Aggressively diuresed  RHC done upon completion of diuresis showed well compensated numbers.  Thought to be either Takotsubo related versus frequent PVC related.  Most recent Echocardiogram September 2018: EF back to 55-60%.  GR 1 DD   Transition from CHF clinic to general cardiology in February 2019 -> is on Entresto, carvedilol, spironolactone along with as needed Lasix.  [name] was last seen via telehealth in April 2020. Noted being bored during the quarantine.  Has been walking 3 times a day 1 or 2 blocks at a time using weights.  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 --> Was only taking Lasix once daily. Had lost almost 20 pounds  Recent Hospitalizations: None  Reviewed  CV studies:    The following studies were reviewed today: (if available, images/films reviewed: From Epic Chart or Care Everywhere) . None:  Interval History:   [name] today overall doing quite well.  She indicates that she is only taking her Lasix once a day and is not having any issues at all with edema.  No PND orthopnea.  Her biggest concerns are how difficult her diabetes has been to control.  She is  in the process of switching PCP.  In fact she is not currently seeing a PCP.  Other than concerns for hypoglycemia and appropriate diabetic management, she is doing quite well from a cardiac standpoint.  CV Review of Symptoms (Summary) no chest pain or dyspnea on exertion negative for - edema, irregular heartbeat, orthopnea, palpitations, paroxysmal nocturnal dyspnea, rapid heart rate, shortness of breath or Syncope/near syncope (although has had some borderline hypoglycemic spells); TIA/amaurosis fugax, claudication.  The patient does not have symptoms concerning for COVID-19 infection (fever, chills, cough, or new shortness of breath).  The patient is practicing social distancing. ++ Masking.  Occasionally goes out for groceries/shopping.  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  It starting to drag on her where she is starting to notice her own anxiety and depression symptoms.   REVIEWED OF SYSTEMS   A comprehensive ROS was performed. Review of Systems  Constitutional: Negative for malaise/fatigue and weight loss.  HENT: Negative for congestion and nosebleeds.   Respiratory: Negative for shortness of breath.   Gastrointestinal: Negative for blood in stool, heartburn and melena.  Genitourinary: Negative for hematuria.  Musculoskeletal: Negative for joint pain.  Neurological: Negative for dizziness.  Psychiatric/Behavioral: Negative for memory loss. The patient is nervous/anxious. The patient does not have insomnia.        Having a hard  time with her own issues related to Covid and on a daily basis having to help students dealing with her concerns as well.  All other systems reviewed and are negative.    I have reviewed and (if needed) personally updated the patient's problem list, medications, allergies, past medical and surgical  history, social and family history.   PAST MEDICAL HISTORY   Past Medical History:  Diagnosis Date  . Breast cancer ([place])    breast - left   . Chronic combined systolic and diastolic CHF (congestive heart failure) (HCC) -Systolic Dysfunction resolved.    a. cMRI 5/10: EF 38% // b. Echo 3/13/: EF 50-55, Gr 1 DD // c. Echo 8/14: EF 40, inf/inf-septal HK, Gr 1 DD, mild MR // d. Echo 5/17: EF 40-45, inf HK, Gr 1 DD, mild MR, mild LAE, reduced RVSF, mild TR, PASP 33 // e. Echo 10/17: EF 40-45, Gr 2 DD, mild MR, severe LAE  . Coronary artery disease, non-occlusive    a. nonobs by LHC in 2010 // b. Myoview 10/17: Lg infarct apex, distal ant and lat walls, no ischemia, EF 38; int risk  // c. [place] 10/17:  dLAD 15, pLCx 30, mRCA 40, LVEDP 15  . Diabetes mellitus   . NICM (nonischemic cardiomyopathy) ([place])      PAST SURGICAL HISTORY   Past Surgical History:  Procedure Laterality Date  . APPENDECTOMY    . BREAST LUMPECTOMY     lt breast  . CARDIAC CATHETERIZATION N/A 12/20/2015   Procedure: Left Heart Cath and Coronary Angiography;  Surgeon: Nelva Bush, MD:: Mild to moderate, nonobstructive coronary artery disease, including 15% distal LAD stenosis, 30% ulcerated mid LCx disease, and 40% mid RCA stenosis.  Upper normal left ventricular filling pressure (LVEDP 15 mmHg).  Marland Kitchen RIGHT HEART CATH N/A 05/08/2016   Procedure: Right Heart Cath;  Surgeon: Jolaine Artist, MD;  Location: [facility];; RA = 3, RV = 34/5, PA = 38/12 (21), PCW = 12; Fick cardiac output/index = 4.6/2.6; PVR = 1.9 WU, Ao sat = 96%; PA sat = 58%, 62%  . TRANSTHORACIC ECHOCARDIOGRAM  10/'17; 3/'18   a) EF 40 and 45%.  GRII DD; b) 04/2016: EF 20% with grade 2 diastolic dysfunction  . TRANSTHORACIC ECHOCARDIOGRAM  10/2016   EF 55-60%.  Normal LV function.  Mild diastolic dysfunction.  Normal valve function  . TRIGGER FINGER RELEASE Left 04/23/2017   Procedure: RELEASE TRIGGER FINGER TO LEFT FORTH FINGER;  Surgeon:  Cristine Polio, MD;  Location: [facility];  Service: Plastics;  Laterality: Left;     MEDICATIONS/ALLERGIES   Current Meds  Medication Sig  . atorvastatin (LIPITOR) 40 MG tablet Take 1 tablet (40 mg total) by mouth daily.  . carvedilol (COREG) 6.25 MG tablet Take 1 tablet (6.25 mg total) by mouth 2 (two) times daily. MUST KEEP APPOINTMENT 01/09/19 WITH DR Nance Mccombs FOR FUTURE REFILLS  . furosemide (LASIX) 40 MG tablet Take 1 tablet (40 mg total) by mouth 2 (two) times daily.  . insulin NPH-regular Human (NOVOLIN 70/30) (70-30) 100 UNIT/ML injection Please start using 25 units in am and 15 units at bedtime. Check CBG's three times a day (including fasting in am) (Patient taking differently: Inject 15 Units into the skin daily. Please start using 25 units in am and 15 units at bedtime. Check CBG's three times a day (including fasting in am))  . sacubitril-valsartan (ENTRESTO) 49-51 MG Take 1 tablet by mouth 2 (two) times daily.  Marland Kitchen  spironolactone (ALDACTONE) 25 MG tablet TAKE 1/2 (ONE-HALF) TABLET BY MOUTH ONCE DAILY AT BEDTIME    Allergies  Allergen Reactions  . Tape Rash    Pt can tolerate the paper tape     SOCIAL HISTORY/FAMILY HISTORY   Social History   Tobacco Use  . Smoking status: Former Smoker    Years: 30.00    Quit date: 09/29/2012    Years since quitting: 6.2  . Smokeless tobacco: Never Used  Substance Use Topics  . Alcohol use: No  . Drug use: No   Social History   Social History Narrative  . Not on file    Family History family history includes Stroke in her father.   [facility] -PE, EKG, labs   Wt Readings from Last 3 Encounters:  01/09/19 155 lb (70.3 kg)  06/07/18 143 lb (64.9 kg)  11/26/17 162 lb 3.2 oz (73.6 kg)    Physical Exam: BP 124/66 (BP Location: Left Arm, Patient Position: Sitting, Cuff Size: Normal)   Pulse 77   Temp (!) 97.5 F (36.4 C)   Ht 5' 3.5" (1.613 m)   Wt 155 lb (70.3 kg)   BMI 27.03 kg/m  Physical Exam   Constitutional: She is oriented to person, place, and time. She appears well-developed and well-nourished. No distress.  Healthy-appearing.  Well-groomed.  HENT:  Head: Normocephalic.  Eyes: Pupils are equal, round, and reactive to light. EOM are normal.  Neck: Normal range of motion. Neck supple. No hepatojugular reflux and no JVD present. Carotid bruit is not present.  Cardiovascular: Normal rate, regular rhythm, normal heart sounds and normal pulses.  Occasional extrasystoles are present. PMI is not displaced. Exam reveals no gallop and no friction rub.  No murmur heard. Pulmonary/Chest: Effort normal and breath sounds normal. No respiratory distress. She has no wheezes. She has no rales.  Abdominal: Soft. Bowel sounds are normal. She exhibits no distension. There is no abdominal tenderness. There is no rebound.  No HSM  Musculoskeletal: Normal range of motion.        General: No edema.  Neurological: She is alert and oriented to person, place, and time.  Skin: Skin is warm and dry. No rash noted. No erythema.  Psychiatric: She has a normal mood and affect. Her behavior is normal. Judgment and thought content normal.  Vitals reviewed.   Adult ECG Report  Rate: 77 ;  Rhythm: normal sinus rhythm, premature ventricular contractions (PVC) and IVCD with LBBB pattern.  Cannot exclude septal MI, age-indeterminate.;   Narrative Interpretation: Stable EKG  Recent Labs: Needs labs Lab Results  Component Value Date   CHOL 189 01/09/2019   HDL 61 01/09/2019   LDLCALC 100 (H) 01/09/2019   TRIG 164 (H) 01/09/2019   CHOLHDL 3.1 01/09/2019   Lab Results  Component Value Date   CREATININE 1.66 (H) 01/09/2019   BUN 36 (H) 01/09/2019   NA 139 01/09/2019   K 5.5 (H) 01/09/2019   CL 102 01/09/2019   CO2 22 01/09/2019    ASSESSMENT/PLAN    Problem List Items Addressed This Visit    Essential hypertension (Chronic)   Chronic diastolic heart failure ([place]) - Primary (Chronic)    Now with  improved EF, she still has diastolic dysfunction with class I CHF symptoms and euvolemia.  For now continue Entresto and carvedilol as well as spironolactone.  On standing dose of Lasix but can take additional dose if necessary.  Needs new PCP.  We will refer to endocrinology for additional diabetes  medicine.  Consider addition of empagliflozin or dapagliflozin (SGLT2 inhibitor).      Relevant Orders   EKG 12-Lead   Comprehensive metabolic panel (Completed)   Hemoglobin A1c (Completed)   Hyperlipidemia associated with type 2 diabetes mellitus (HCC) (Chronic)    There are no labs checked in a long time..  Need baseline lipid panel.  Is currently on atorvastatin.  Continue for now.      Relevant Orders   Lipid panel (Completed)   Comprehensive metabolic panel (Completed)   Type 2 diabetes mellitus without complication, with long-term current use of insulin (HCC) (Chronic)    Now is only on 70/30 insulin and she is having a very hard time managing her glycemic control.  Needs some assistance.  She is with her being on insulin, I would not refer her to endocrinology for assistance.  I suspect that she may benefit from being on a longer acting 24-hour option of insulin with either meal coverage or addition of SGLT2 inhibitor.  (With known cardiac disease, SGLT2 inhibitor would be indicated).  Can you check an A1c for her now      Relevant Orders   EKG 12-Lead   Lipid panel (Completed)   Comprehensive metabolic panel (Completed)   Hemoglobin A1c (Completed)   Ambulatory referral to Endocrinology   NICM (nonischemic cardiomyopathy) ([place]) (Chronic)    Most recently echo shows her EF is pretty much back to baseline now.  She is on very aggressive management with moderate dose Entresto and carvedilol along with spironolactone.  Most class I CHF symptoms, euvolemic on exam.  Only on daily Lasix as opposed to twice daily Lasix.  Plan: Continue current dose of medications, will recheck  chemistry levels to ensure stable renal function.  Since she has had intermittent episodes of reduced EF after initial recovery, we will continue Entresto to maintain stability.      CKD (chronic kidney disease), stage II    Has not had labs checked in a while.  We will check chemistry panel since she is on Entresto and spironolactone.          COVID-19 Education: The signs and symptoms of COVID-19 were discussed with the patient and how to seek care for testing (follow up with PCP or arrange E-visit).   The importance of social distancing was discussed today.  I spent a total of 85minutes with the patient and chart review. >  50% of the time was spent in direct patient consultation.  Additional time spent with chart review (studies, outside notes, etc): 6 Total Time: 27 min   Current medicines are reviewed at length with the patient today.  (+/- concerns) mostly noted concerns about diabetes management.  >> She needs to establish primary care relatively quickly, but with somewhat concerning diabetes control, I will refer to endocrinology per her request.  We are ordering baseline labs including A1c levels today.   Patient Instructions / Medication Changes & Studies & Tests Ordered   Patient Instructions  Medication Instructions:  No changes *If you need a refill on your cardiac medications before your next appointment, please call your pharmacy*  Lab Work:  today - lipid, cmp , hgba1c  If you have labs (blood work) drawn today and your tests are completely normal, you will receive your results only by: Marland Kitchen MyChart Message (if you have MyChart) OR . A paper copy in the mail If you have any lab test that is abnormal or we need to change your treatment, we will  call you to review the results.  Testing/Procedures: Not needed  Follow-Up: At [facility], you and your health needs are our priority.  As part of our continuing mission to provide you with exceptional heart care,  we have created designated Provider Care Teams.  These Care Teams include your primary Cardiologist (physician) and Advanced Practice Providers (APPs -  Physician Assistants and Nurse Practitioners) who all work together to provide you with the care you need, when you need it.  Your next appointment:   6 month(s) 12 months  With Dr Ellyn Hack The format for your next appointment:   In Person  Provider:   Jory Sims, DNP, ANP Dr Ellyn Hack in 12 months  Other Instructions   We are planning to do alternating APP and MD visits every 6 months until stable.  At that point, we could consider alternating every 30-month visits.  Studies Ordered:   Orders Placed This Encounter  Procedures  . Lipid panel  . Comprehensive metabolic panel  . Hemoglobin A1c  . Ambulatory referral to Endocrinology  . EKG 12-Lead     Glenetta Hew, M.D., M.S. Interventional Cardiologist   Pager # [phone] Phone # [phone] [address] [place], [place] [address]   Thank you for choosing Heartcare at [facility]!!

## 2019-01-09 NOTE — Patient Instructions (Signed)
Medication Instructions:  No changes *If you need a refill on your cardiac medications before your next appointment, please call your pharmacy*  Lab Work:  today - lipid, cmp , hgba1c  If you have labs (blood work) drawn today and your tests are completely normal, you will receive your results only by: Marland Kitchen MyChart Message (if you have MyChart) OR . A paper copy in the mail If you have any lab test that is abnormal or we need to change your treatment, we will call you to review the results.  Testing/Procedures: Not needed  Follow-Up: At [facility], you and your health needs are our priority.  As part of our continuing mission to provide you with exceptional heart care, we have created designated Provider Care Teams.  These Care Teams include your primary Cardiologist (physician) and Advanced Practice Providers (APPs -  Physician Assistants and Nurse Practitioners) who all work together to provide you with the care you need, when you need it.  Your next appointment:   6 month(s) 12 months  With Dr [name] The format for your next appointment:   In Person  Provider:   Jory Sims, DNP, ANP Dr [name] in 12 months  Other Instructions

## 2019-01-10 LAB — COMPREHENSIVE METABOLIC PANEL
ALT: 11 IU/L (ref 0–32)
AST: 16 IU/L (ref 0–40)
Albumin/Globulin Ratio: 1.5 (ref 1.2–2.2)
Albumin: 4.1 g/dL (ref 3.7–4.7)
Alkaline Phosphatase: 108 IU/L (ref 39–117)
BUN/Creatinine Ratio: 22 (ref 12–28)
BUN: 36 mg/dL — ABNORMAL HIGH (ref 8–27)
Bilirubin Total: 0.5 mg/dL (ref 0.0–1.2)
CO2: 22 mmol/L (ref 20–29)
Calcium: 9.7 mg/dL (ref 8.7–10.3)
Chloride: 102 mmol/L (ref 96–106)
Creatinine, Ser: 1.66 mg/dL — ABNORMAL HIGH (ref 0.57–1.00)
GFR calc Af Amer: 35 mL/min/{1.73_m2} — ABNORMAL LOW (ref >59)
GFR calc non Af Amer: 30 mL/min/{1.73_m2} — ABNORMAL LOW (ref >59)
Globulin, Total: 2.7 g/dL (ref 1.5–4.5)
Glucose: 300 mg/dL — ABNORMAL HIGH (ref 65–99)
Potassium: 5.5 mmol/L — ABNORMAL HIGH (ref 3.5–5.2)
Sodium: 139 mmol/L (ref 134–144)
Total Protein: 6.8 g/dL (ref 6.0–8.5)

## 2019-01-10 LAB — LIPID PANEL
Chol/HDL Ratio: 3.1 ratio (ref 0.0–4.4)
Cholesterol, Total: 189 mg/dL (ref 100–199)
HDL: 61 mg/dL (ref >39)
LDL Chol Calc (NIH): 100 mg/dL — ABNORMAL HIGH (ref 0–99)
Triglycerides: 164 mg/dL — ABNORMAL HIGH (ref 0–149)
VLDL Cholesterol Cal: 28 mg/dL (ref 5–40)

## 2019-01-10 LAB — HEMOGLOBIN A1C
Est. average glucose Bld gHb Est-mCnc: 280 mg/dL
Hgb A1c MFr Bld: 11.4 % — ABNORMAL HIGH (ref 4.8–5.6)

## 2019-01-11 ENCOUNTER — Encounter: Payer: Self-pay | Admitting: Cardiology

## 2019-01-11 NOTE — Assessment & Plan Note (Signed)
Now is only on 70/30 insulin and she is having a very hard time managing her glycemic control.  Needs some assistance.  She is with her being on insulin, I would not refer her to endocrinology for assistance.  I suspect that she may benefit from being on a longer acting 24-hour option of insulin with either meal coverage or addition of SGLT2 inhibitor.  (With known cardiac disease, SGLT2 inhibitor would be indicated).  Can you check an A1c for her now

## 2019-01-11 NOTE — Assessment & Plan Note (Signed)
Most recently echo shows her EF is pretty much back to baseline now.  She is on very aggressive management with moderate dose Entresto and carvedilol along with spironolactone.  Most class I CHF symptoms, euvolemic on exam.  Only on daily Lasix as opposed to twice daily Lasix.  Plan: Continue current dose of medications, will recheck chemistry levels to ensure stable renal function.  Since she has had intermittent episodes of reduced EF after initial recovery, we will continue Entresto to maintain stability.

## 2019-01-11 NOTE — Assessment & Plan Note (Signed)
Now with improved EF, she still has diastolic dysfunction with class I CHF symptoms and euvolemia.  For now continue Entresto and carvedilol as well as spironolactone.  On standing dose of Lasix but can take additional dose if necessary.  Needs new PCP.  We will refer to endocrinology for additional diabetes medicine.  Consider addition of empagliflozin or dapagliflozin (SGLT2 inhibitor).

## 2019-01-11 NOTE — Assessment & Plan Note (Signed)
Has not had labs checked in a while.  We will check chemistry panel since she is on Entresto and spironolactone.

## 2019-01-11 NOTE — Assessment & Plan Note (Signed)
There are no labs checked in a long time..  Need baseline lipid panel.  Is currently on atorvastatin.  Continue for now.

## 2019-02-03 ENCOUNTER — Ambulatory Visit: Payer: Medicare Other | Admitting: Internal Medicine

## 2019-03-07 ENCOUNTER — Ambulatory Visit: Payer: Medicare Other | Admitting: Internal Medicine

## 2019-03-10 ENCOUNTER — Ambulatory Visit: Payer: Medicare Other | Admitting: Internal Medicine

## 2019-05-06 ENCOUNTER — Telehealth: Payer: Self-pay | Admitting: Cardiology

## 2019-05-06 NOTE — Telephone Encounter (Signed)
*  STAT* If patient is at the pharmacy, call can be transferred to refill team.   1. Which medications need to be refilled? (please list name of each medication and dose if known) atorvastatin (LIPITOR) 40 MG tablet  carvedilol (COREG) 6.25 MG tablet  furosemide (LASIX) 40 MG tablet  2. Which pharmacy/location (including street and city if local pharmacy) is medication to be sent to? [facility] - [place], [place] - [address]  3. Do they need a 30 day or 90 day supply? 90  

## 2019-05-07 MED ORDER — FUROSEMIDE 40 MG PO TABS: 40.0000 mg | ORAL_TABLET | Freq: Two times a day (BID) | ORAL | 3 refills | Status: DC

## 2019-05-07 MED ORDER — CARVEDILOL 6.25 MG PO TABS: 6.2500 mg | ORAL_TABLET | Freq: Two times a day (BID) | ORAL | 3 refills | Status: DC

## 2019-05-07 MED ORDER — ATORVASTATIN CALCIUM 40 MG PO TABS: 40.0000 mg | ORAL_TABLET | Freq: Every day | ORAL | 3 refills | Status: DC

## 2019-05-07 NOTE — Telephone Encounter (Signed)
Refills sent in as requested ./cy

## 2019-05-15 ENCOUNTER — Ambulatory Visit: Payer: Medicare Other | Attending: Family

## 2019-05-15 ENCOUNTER — Ambulatory Visit: Payer: Self-pay | Attending: Internal Medicine

## 2019-05-15 DIAGNOSIS — Z23 Encounter for immunization: Secondary | ICD-10-CM

## 2019-05-15 NOTE — Progress Notes (Signed)
   Covid-19 Vaccination Clinic  Name:  [name]    MRN: [id number] DOB: September 12, 1945  05/15/2019  [name] was observed post Covid-19 immunization for 15 minutes without incident. She was provided with Vaccine Information Sheet and instruction to access the V-Safe system.   [name] was instructed to call 911 with any severe reactions post vaccine: Marland Kitchen Difficulty breathing  . Swelling of face and throat  . A fast heartbeat  . A bad rash all over body  . Dizziness and weakness   Immunizations Administered    Name Date Dose VIS Date Route   Moderna COVID-19 Vaccine 05/15/2019 11:16 AM 0.5 mL 01/21/2019 Intramuscular   Manufacturer: Moderna   Lot: [id number]   [id number]

## 2019-05-26 ENCOUNTER — Ambulatory Visit (INDEPENDENT_AMBULATORY_CARE_PROVIDER_SITE_OTHER): Payer: Medicare Other | Admitting: Ophthalmology

## 2019-05-26 ENCOUNTER — Other Ambulatory Visit: Payer: Self-pay

## 2019-05-26 ENCOUNTER — Encounter (INDEPENDENT_AMBULATORY_CARE_PROVIDER_SITE_OTHER): Payer: Self-pay | Admitting: Ophthalmology

## 2019-05-26 DIAGNOSIS — H353131 Nonexudative age-related macular degeneration, bilateral, early dry stage: Secondary | ICD-10-CM | POA: Diagnosis not present

## 2019-05-26 DIAGNOSIS — H35352 Cystoid macular degeneration, left eye: Secondary | ICD-10-CM | POA: Insufficient documentation

## 2019-05-26 DIAGNOSIS — E113393 Type 2 diabetes mellitus with moderate nonproliferative diabetic retinopathy without macular edema, bilateral: Secondary | ICD-10-CM | POA: Insufficient documentation

## 2019-05-26 DIAGNOSIS — H353221 Exudative age-related macular degeneration, left eye, with active choroidal neovascularization: Secondary | ICD-10-CM

## 2019-05-26 MED ORDER — BEVACIZUMAB CHEMO INJECTION 1.25MG/0.05ML SYRINGE FOR KALEIDOSCOPE
1.2500 mg | INTRAVITREAL | Status: AC | PRN
  Administered 2019-05-26: 1.25 mg via INTRAVITREAL

## 2019-05-26 NOTE — Assessment & Plan Note (Signed)
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 Currently, no eye medications or vitamins slow this type of aging effect upon vision, however cessation of smoking and controlling hypertension help slow the disorder. The following analogy helps explain this: I describe the dry form of ARMD like a house of the same age as your eyes, which shows typical wear and tear of age upon the house structure and appearance. Like the aging house which can fall down structurally, the dry form of ARMD can deteriorate the structure of the macula (center) of the retina, most often gradually and affect central vision tasks such as reading and driving. The wet form of ARMD refers to the development of abnormally growing blood vessels usually near or under the central vision, with potential risk of permanent visual changes or vision losses. This complication of the Dry form of ARMD may be moderately reduced by use of AREDS 2 formula multivitamins daily. I describe the Wet form of ARMD as like the development of a fire in an aging house (DRY ARMD). It may develop suddenly, progress rapidly and be destructive based on where it starts and how big the fire is when found. In the eye, the house fire analogy refers to the abnormal blood vessels growing destructively near the central vison in the retina, or film of the eye. Halting the growth of blood vessels with laser (hot or cold) or injectable medications is best way "to put the fire out". Many patients will experience a stabilization or even improvement in vision with a treatment course, while others may still face a loss of vision. The use of injectable medications has revolutionized therapy and is  currently the only proven therapy to provide the chance of stable or improved acuity from new and recent destructive wet ARMD. 

## 2019-05-26 NOTE — Assessment & Plan Note (Signed)
The nature of moderate nonproliferative diabetic retinopathy was discussed with the patient as well as the need for more frequent follow up to judge for progression. Tight glucose, blood pressure, and serum lipid control was recommended as well as avoidance of smoking and maintenance of normal weight.

## 2019-06-03 ENCOUNTER — Telehealth: Payer: Self-pay | Admitting: Cardiology

## 2019-06-03 MED ORDER — FUROSEMIDE 40 MG PO TABS: 40.0000 mg | ORAL_TABLET | Freq: Two times a day (BID) | ORAL | 3 refills | Status: DC

## 2019-06-03 MED ORDER — ATORVASTATIN CALCIUM 40 MG PO TABS: 40.0000 mg | ORAL_TABLET | Freq: Every day | ORAL | 3 refills | Status: DC

## 2019-06-03 MED ORDER — CARVEDILOL 6.25 MG PO TABS: 6.2500 mg | ORAL_TABLET | Freq: Two times a day (BID) | ORAL | 3 refills | Status: DC

## 2019-06-03 NOTE — Telephone Encounter (Signed)
Patient advised to fill out her portion, bring the paperwork to the office once completed and Dr.Harding will do his portion and we will fax it in. Patient verbalized understanding.   Patient also needed refills to the pharmacy, sent RX.

## 2019-06-03 NOTE — Telephone Encounter (Signed)
Patient wants to bring her paperwork for Entresto up to the office to have Dr. Ellyn Hack fill out. Please let her know when she can come by. The deadline is May 20th.

## 2019-06-10 ENCOUNTER — Telehealth: Payer: Self-pay | Admitting: Cardiology

## 2019-06-10 NOTE — Telephone Encounter (Signed)
Returned call to patient Left message on personal voice mail Dr.Harding's RN is out of office this week.I will send message to her.Advised to call me back to discuss what is missing from the application.

## 2019-06-10 NOTE — Telephone Encounter (Signed)
S/W PT THIS WAS REJECTED BECAUSE THERE IS NOT INSURANCE INFO. PT STATES THAT SHE CANNOT AFFORD PART D INSURANCE. SHE WILL CALL PT [employer] AND HAVE THEM UPDATE THEIR INFORMATION

## 2019-06-10 NOTE — Telephone Encounter (Signed)
Spoke to patient she stated Entresto application is incomplete.Stated we will need to contact [employer] Patient Assistance.Advised Dr.Harding's RN is out of office this week.I will send message to her.

## 2019-06-10 NOTE — Telephone Encounter (Signed)
Follow up  Pt is returning call, transferred call

## 2019-06-10 NOTE — Telephone Encounter (Signed)
New Message  Pt called and informed us that the application for ENTRESTO came back as incomplete and that the deadline was 06/10/19. Wants to know what she can do to get her medication .  Please call back

## 2019-06-17 ENCOUNTER — Ambulatory Visit: Payer: Medicare Other | Attending: Family

## 2019-06-17 DIAGNOSIS — Z23 Encounter for immunization: Secondary | ICD-10-CM

## 2019-06-17 NOTE — Progress Notes (Signed)
   Covid-19 Vaccination Clinic  Name:  [name]    MRN: [id number] DOB: July 23, 1945  06/17/2019  [name] was observed post Covid-19 immunization for 15 minutes without incident. She was provided with Vaccine Information Sheet and instruction to access the V-Safe system.   [name] was instructed to call 911 with any severe reactions post vaccine: Marland Kitchen Difficulty breathing  . Swelling of face and throat  . A fast heartbeat  . A bad rash all over body  . Dizziness and weakness   Immunizations Administered    Name Date Dose VIS Date Route   Moderna COVID-19 Vaccine 06/17/2019 11:15 AM 0.5 mL 01/2019 Intramuscular   Manufacturer: Moderna   Lot: [id number]   [id number]

## 2019-07-08 ENCOUNTER — Ambulatory Visit: Payer: Medicare Other | Admitting: Cardiology

## 2019-07-14 ENCOUNTER — Ambulatory Visit: Payer: Medicare Other | Admitting: Cardiology

## 2019-07-15 ENCOUNTER — Other Ambulatory Visit: Payer: Self-pay

## 2019-07-15 ENCOUNTER — Ambulatory Visit (INDEPENDENT_AMBULATORY_CARE_PROVIDER_SITE_OTHER): Payer: Medicare Other | Admitting: Ophthalmology

## 2019-07-15 ENCOUNTER — Encounter (INDEPENDENT_AMBULATORY_CARE_PROVIDER_SITE_OTHER): Payer: Self-pay | Admitting: Ophthalmology

## 2019-07-15 DIAGNOSIS — H353221 Exudative age-related macular degeneration, left eye, with active choroidal neovascularization: Secondary | ICD-10-CM | POA: Diagnosis not present

## 2019-07-15 DIAGNOSIS — H353222 Exudative age-related macular degeneration, left eye, with inactive choroidal neovascularization: Secondary | ICD-10-CM | POA: Insufficient documentation

## 2019-07-15 MED ORDER — BEVACIZUMAB CHEMO INJECTION 1.25MG/0.05ML SYRINGE FOR KALEIDOSCOPE
1.2500 mg | INTRAVITREAL | Status: AC | PRN
  Administered 2019-07-15: 1.25 mg via INTRAVITREAL

## 2019-07-15 NOTE — Assessment & Plan Note (Signed)
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  Forms of therapy reviewed include the use of Anti-VEGF medications injected painlessly into the eye, as well as other possible treatment modalities, including thermal laser therapy. Fellow eye involvement and risks were discussed with the patient. Upon the finding of wet age related macular degeneration, treatment will be offered. The treatment regimen is on a treat as needed basis with the intent to treat if necessary and extend interval of exams when possible. On average 1 out of 6 patients do not need lifetime therapy. However, the risk of recurrent disease is high for a lifetime.  Initially monthly, then periodic, examinations and evaluations will determine whether the next treatment is required on the day of the examination.  OS, much less active subfoveal fibrosis.  No overlying CME.  Visual acuity limited by the scarring however there is no sign of active lesion growth.  Currently on 7-week exam interval

## 2019-07-15 NOTE — Progress Notes (Signed)
07/15/2019     CHIEF COMPLAINT Patient presents for Retina Follow Up   HISTORY OF PRESENT ILLNESS: Ashley KELLOG is a 73 y.o. female who presents to the clinic today for:   HPI    Retina Follow Up    Patient presents with  Wet AMD.  In left eye.  This started 7 weeks ago.  Severity is mild.  Duration of 7 weeks.  Since onset it is stable.          Comments    7 Week AMD F/U OS, poss Avastin OS  Pt denies noticeable changes to [place] OU since last visit. Pt denies ocular pain, flashes of light, or floaters OU. Pt sts spot in [place] OS is no longer black, but it is now grey.        Last edited by Rockie Neighbours, [place] on 07/15/2019  1:09 PM. (History)      Referring physician: Hurman Horn, MD [address],  [place] [id number]  HISTORICAL INFORMATION:   Selected notes from the MEDICAL RECORD NUMBER    Lab Results  Component Value Date   HGBA1C 11.4 (H) 01/09/2019     CURRENT MEDICATIONS: No current outpatient medications on file. (Ophthalmic Drugs)   No current facility-administered medications for this visit. (Ophthalmic Drugs)   Current Outpatient Medications (Other)  Medication Sig  . atorvastatin (LIPITOR) 40 MG tablet Take 1 tablet (40 mg total) by mouth daily.  . carvedilol (COREG) 6.25 MG tablet Take 1 tablet (6.25 mg total) by mouth 2 (two) times daily.  . furosemide (LASIX) 40 MG tablet Take 1 tablet (40 mg total) by mouth 2 (two) times daily.  . insulin NPH-regular Human (NOVOLIN 70/30) (70-30) 100 UNIT/ML injection Please start using 25 units in am and 15 units at bedtime. Check CBG's three times a day (including fasting in am) (Patient taking differently: Inject 15 Units into the skin daily. Please start using 25 units in am and 15 units at bedtime. Check CBG's three times a day (including fasting in am))  . sacubitril-valsartan (ENTRESTO) 49-51 MG Take 1 tablet by mouth 2 (two) times daily.  Marland Kitchen spironolactone (ALDACTONE) 25 MG tablet TAKE 1/2 (ONE-HALF)  TABLET BY MOUTH ONCE DAILY AT BEDTIME   No current facility-administered medications for this visit. (Other)      REVIEW OF SYSTEMS:    ALLERGIES Allergies  Allergen Reactions  . Tape Rash    Pt can tolerate the paper tape    PAST MEDICAL HISTORY Past Medical History:  Diagnosis Date  . Breast cancer ([place])    breast - left   . Chronic combined systolic and diastolic CHF (congestive heart failure) (HCC) -Systolic Dysfunction resolved.    a. cMRI 5/10: EF 38% // b. Echo 3/13/: EF 50-55, Gr 1 DD // c. Echo 8/14: EF 40, inf/inf-septal HK, Gr 1 DD, mild MR // d. Echo 5/17: EF 40-45, inf HK, Gr 1 DD, mild MR, mild LAE, reduced RVSF, mild TR, PASP 33 // e. Echo 10/17: EF 40-45, Gr 2 DD, mild MR, severe LAE  . Coronary artery disease, non-occlusive    a. nonobs by LHC in 2010 // b. Myoview 10/17: Lg infarct apex, distal ant and lat walls, no ischemia, EF 38; int risk  // c. [place] 10/17:  dLAD 15, pLCx 30, mRCA 40, LVEDP 15  . Diabetes mellitus   . NICM (nonischemic cardiomyopathy) [facility])    Past Surgical History:  Procedure Laterality Date  . APPENDECTOMY    .  BREAST LUMPECTOMY     lt breast  . CARDIAC CATHETERIZATION N/A 12/20/2015   Procedure: Left Heart Cath and Coronary Angiography;  Surgeon: Nelva Bush, MD:: Mild to moderate, nonobstructive coronary artery disease, including 15% distal LAD stenosis, 30% ulcerated mid LCx disease, and 40% mid RCA stenosis.  Upper normal left ventricular filling pressure (LVEDP 15 mmHg).  Marland Kitchen RIGHT HEART CATH N/A 05/08/2016   Procedure: Right Heart Cath;  Surgeon: Jolaine Artist, MD;  Location: [facility];; RA = 3, RV = 34/5, PA = 38/12 (21), PCW = 12; Fick cardiac output/index = 4.6/2.6; PVR = 1.9 WU, Ao sat = 96%; PA sat = 58%, 62%  . TRANSTHORACIC ECHOCARDIOGRAM  10/'17; 3/'18   a) EF 40 and 45%.  GRII DD; b) 04/2016: EF 20% with grade 2 diastolic dysfunction  . TRANSTHORACIC ECHOCARDIOGRAM  10/2016   EF 55-60%.  Normal LV function.   Mild diastolic dysfunction.  Normal valve function  . TRIGGER FINGER RELEASE Left 04/23/2017   Procedure: RELEASE TRIGGER FINGER TO LEFT FORTH FINGER;  Surgeon: Cristine Polio, MD;  Location: [place];  Service: Plastics;  Laterality: Left;    FAMILY HISTORY Family History  Problem Relation Age of Onset  . Stroke Father     SOCIAL HISTORY Social History   Tobacco Use  . Smoking status: Former Smoker    Years: 30.00    Quit date: 09/29/2012    Years since quitting: 6.7  . Smokeless tobacco: Never Used  Substance Use Topics  . Alcohol use: No  . Drug use: No         OPHTHALMIC EXAM:  Base Eye Exam    Visual Acuity (ETDRS)      Right Left   Dist [place] 20/25 +1 CF at 2'   Dist ph [place]  NI       Tonometry (Tonopen, 1:13 PM)      Right Left   Pressure 19 16       Pupils      Pupils Dark Light Shape React APD   Right PERRL 4 3 Round Brisk None   Left PERRL 4 3 Round Brisk None       Visual Fields (Counting fingers)      Left Right    Full Full       Extraocular Movement      Right Left    Full Full       Neuro/Psych    Oriented x3: Yes   Mood/Affect: Normal       Dilation    Left eye: 1.0% Mydriacyl, 2.5% Phenylephrine @ 1:13 PM        Slit Lamp and Fundus Exam    External Exam      Right Left   External Normal Normal       Slit Lamp Exam      Right Left   Lids/Lashes Normal Normal   Conjunctiva/Sclera White and quiet White and quiet   Cornea Clear Clear   Anterior Chamber Deep and quiet Deep and quiet   Iris Round and reactive Round and reactive   Lens Posterior chamber intraocular lens Posterior chamber intraocular lens       Fundus Exam      Right Left   Posterior Vitreous  Normal   Disc  Normal   C/D Ratio  0.2-0.3   Macula  Mottling, Subretinal fibrosis, Atrophy, Retinal pigment epithelial atrophy, Geographic atrophy, Pigmented atrophy   Vessels  Normal  Periphery  Normal          IMAGING AND PROCEDURES    Imaging and Procedures for 07/15/19  OCT, Retina - OU - Both Eyes       Right Eye Quality was good. Scan locations included subfoveal. Central Foveal Thickness: 246. Progression has been stable. Findings include retinal drusen .   Left Eye Quality was good. Central Foveal Thickness: 184. Progression has improved. Findings include vitreomacular adhesion , abnormal foveal contour.   Notes OS with extensive foveal atrophy overlying the disc for macular scar.  No active edges to the macular lesion       Intravitreal Injection, Pharmacologic Agent - OS - Left Eye       Time Out 07/15/2019. 2:01 PM. Confirmed correct patient, procedure, site, and patient consented.   Anesthesia Topical anesthesia was used. Anesthetic medications included Akten 3.5%.   Procedure Preparation included Tobramycin 0.3%, 10% betadine to eyelids. A 30 gauge needle was used.   Injection:  1.25 mg Bevacizumab (AVASTIN) SOLN   NDC: [id number], [id number]   Route: Intravitreal, Site: Left Eye, Waste: 0 mg  Post-op Post injection exam found visual acuity of at least counting fingers. The patient tolerated the procedure well. There were no complications. The patient received written and verbal post procedure care education. Post injection medications were not given.                 ASSESSMENT/PLAN:  Exudative age-related macular degeneration of left eye with active choroidal neovascularization (HCC) The nature of wet macular degeneration was discussed with the patient.  Forms of therapy reviewed include the use of Anti-VEGF medications injected painlessly into the eye, as well as other possible treatment modalities, including thermal laser therapy. Fellow eye involvement and risks were discussed with the patient. Upon the finding of wet age related macular degeneration, treatment will be offered. The treatment regimen is on a treat as needed basis with the intent to treat if necessary and extend  interval of exams when possible. On average 1 out of 6 patients do not need lifetime therapy. However, the risk of recurrent disease is high for a lifetime.  Initially monthly, then periodic, examinations and evaluations will determine whether the next treatment is required on the day of the examination.  OS, much less active subfoveal fibrosis.  No overlying CME.  Visual acuity limited by the scarring however there is no sign of active lesion growth.  Currently on 7-week exam interval      ICD-10-CM   1. Exudative age-related macular degeneration of left eye with active choroidal neovascularization (HCC)  H35.3221 OCT, Retina - OU - Both Eyes    Intravitreal Injection, Pharmacologic Agent - OS - Left Eye    Bevacizumab (AVASTIN) SOLN 1.25 mg    1.  Exudative choroidal neovascular membrane left eye, no active lesion edges.  Currently doing well at 7-week interval repeat intravitreal Avastin OS today.  2.  Repeat examination OU in 9 weeks  3.  Possible intravitreal Avastin OS in 9 weeks  Ophthalmic Meds Ordered this visit:  Meds ordered this encounter  Medications  . Bevacizumab (AVASTIN) SOLN 1.25 mg       Return in about 9 weeks (around 09/16/2019) for OS, AVASTIN OCT, DILATE OU.  There are no Patient Instructions on file for this visit.   Explained the diagnoses, plan, and follow up with the patient and they expressed understanding.  Patient expressed understanding of the importance of proper follow up care.  Clent Demark Megyn Leng M.D. Diseases & Surgery of the Retina and Vitreous Retina & Diabetic [place] 07/15/19     Abbreviations: M myopia (nearsighted); A astigmatism; H hyperopia (farsighted); P presbyopia; Mrx spectacle prescription;  CTL contact lenses; OD right eye; OS left eye; OU both eyes  XT exotropia; ET esotropia; PEK punctate epithelial keratitis; PEE punctate epithelial erosions; DES dry eye syndrome; MGD meibomian gland dysfunction; ATs artificial tears; PFAT's  preservative free artificial tears; [place] nuclear sclerotic cataract; PSC posterior subcapsular cataract; ERM epi-retinal membrane; PVD posterior vitreous detachment; RD retinal detachment; DM diabetes mellitus; DR diabetic retinopathy; NPDR non-proliferative diabetic retinopathy; PDR proliferative diabetic retinopathy; CSME clinically significant macular edema; DME diabetic macular edema; dbh dot blot hemorrhages; CWS cotton wool spot; POAG primary open angle glaucoma; C/D cup-to-disc ratio; HVF humphrey visual field; GVF goldmann visual field; OCT optical coherence tomography; IOP intraocular pressure; BRVO Branch retinal vein occlusion; CRVO central retinal vein occlusion; CRAO central retinal artery occlusion; BRAO branch retinal artery occlusion; RT retinal tear; SB scleral buckle; PPV pars plana vitrectomy; VH Vitreous hemorrhage; PRP panretinal laser photocoagulation; IVK intravitreal kenalog; VMT vitreomacular traction; MH Macular hole;  NVD neovascularization of the disc; NVE neovascularization elsewhere; AREDS age related eye disease study; ARMD age related macular degeneration; POAG primary open angle glaucoma; EBMD epithelial/anterior basement membrane dystrophy; ACIOL anterior chamber intraocular lens; IOL intraocular lens; PCIOL posterior chamber intraocular lens; Phaco/IOL phacoemulsification with intraocular lens placement; [place] photorefractive keratectomy; LASIK laser assisted in situ keratomileusis; HTN hypertension; DM diabetes mellitus; COPD chronic obstructive pulmonary disease

## 2019-08-21 ENCOUNTER — Ambulatory Visit: Payer: Medicare Other | Admitting: Cardiology

## 2019-09-16 ENCOUNTER — Other Ambulatory Visit: Payer: Self-pay

## 2019-09-16 ENCOUNTER — Encounter (INDEPENDENT_AMBULATORY_CARE_PROVIDER_SITE_OTHER): Payer: Self-pay | Admitting: Ophthalmology

## 2019-09-16 ENCOUNTER — Encounter (INDEPENDENT_AMBULATORY_CARE_PROVIDER_SITE_OTHER): Payer: Medicare Other | Admitting: Ophthalmology

## 2019-09-16 ENCOUNTER — Ambulatory Visit (INDEPENDENT_AMBULATORY_CARE_PROVIDER_SITE_OTHER): Payer: Medicare Other | Admitting: Ophthalmology

## 2019-09-16 DIAGNOSIS — H353221 Exudative age-related macular degeneration, left eye, with active choroidal neovascularization: Secondary | ICD-10-CM

## 2019-09-16 MED ORDER — BEVACIZUMAB CHEMO INJECTION 1.25MG/0.05ML SYRINGE FOR KALEIDOSCOPE
1.2500 mg | INTRAVITREAL | Status: AC | PRN
  Administered 2019-09-16: 1.25 mg via INTRAVITREAL

## 2019-09-16 NOTE — Assessment & Plan Note (Signed)
OS, improved perifoveal CNVM on intravitreal Avastin, at 9-week interval.  Repeat intravitreal injection today and examination in 3 months

## 2019-09-16 NOTE — Progress Notes (Signed)
09/16/2019     CHIEF COMPLAINT Patient presents for Retina Follow Up   HISTORY OF PRESENT ILLNESS: [name] is a 74 y.o. female who presents to the clinic today for:   HPI    Retina Follow Up    Patient presents with  Wet AMD.  In left eye.  Duration of 9 weeks.  Since onset it is stable.          Comments    9 week follow up - OCT OU, poss Avastin OS Patient denies change in vision and overall has no complaints.        Last edited by Gerda Diss on 09/16/2019 10:46 AM. (History)      Referring physician: Hurman Horn, MD [place],  [place] [id number]  HISTORICAL INFORMATION:   Selected notes from the MEDICAL RECORD NUMBER    Lab Results  Component Value Date   HGBA1C 11.4 (H) 01/09/2019     CURRENT MEDICATIONS: No current outpatient medications on file. (Ophthalmic Drugs)   No current facility-administered medications for this visit. (Ophthalmic Drugs)   Current Outpatient Medications (Other)  Medication Sig   atorvastatin (LIPITOR) 40 MG tablet Take 1 tablet (40 mg total) by mouth daily.   carvedilol (COREG) 6.25 MG tablet Take 1 tablet (6.25 mg total) by mouth 2 (two) times daily.   furosemide (LASIX) 40 MG tablet Take 1 tablet (40 mg total) by mouth 2 (two) times daily.   insulin NPH-regular Human (NOVOLIN 70/30) (70-30) 100 UNIT/ML injection Please start using 25 units in am and 15 units at bedtime. Check CBG's three times a day (including fasting in am) (Patient taking differently: Inject 15 Units into the skin daily. Please start using 25 units in am and 15 units at bedtime. Check CBG's three times a day (including fasting in am))   sacubitril-valsartan (ENTRESTO) 49-51 MG Take 1 tablet by mouth 2 (two) times daily.   spironolactone (ALDACTONE) 25 MG tablet TAKE 1/2 (ONE-HALF) TABLET BY MOUTH ONCE DAILY AT BEDTIME   No current facility-administered medications for this visit. (Other)      REVIEW OF  SYSTEMS:    ALLERGIES Allergies  Allergen Reactions   Tape Rash    Pt can tolerate the paper tape    PAST MEDICAL HISTORY Past Medical History:  Diagnosis Date   Breast cancer ([place])    breast - left    Chronic combined systolic and diastolic CHF (congestive heart failure) (HCC) -Systolic Dysfunction resolved.    a. cMRI 5/10: EF 38% // b. Echo 3/13/: EF 50-55, Gr 1 DD // c. Echo 8/14: EF 40, inf/inf-septal HK, Gr 1 DD, mild MR // d. Echo 5/17: EF 40-45, inf HK, Gr 1 DD, mild MR, mild LAE, reduced RVSF, mild TR, PASP 33 // e. Echo 10/17: EF 40-45, Gr 2 DD, mild MR, severe LAE   Coronary artery disease, non-occlusive    a. nonobs by LHC in 2010 // b. Myoview 10/17: Lg infarct apex, distal ant and lat walls, no ischemia, EF 38; int risk  // c. [place] 10/17:  dLAD 15, pLCx 91, mRCA 40, LVEDP 15   Diabetes mellitus    NICM (nonischemic cardiomyopathy) ([place])    Past Surgical History:  Procedure Laterality Date   APPENDECTOMY     BREAST LUMPECTOMY     lt breast   CARDIAC CATHETERIZATION N/A 12/20/2015   Procedure: Left Heart Cath and Coronary Angiography;  Surgeon: Nelva Bush, MD:: Mild to moderate, nonobstructive coronary  artery disease, including 15% distal LAD stenosis, 30% ulcerated mid LCx disease, and 40% mid RCA stenosis.  Upper normal left ventricular filling pressure (LVEDP 15 mmHg).   RIGHT HEART CATH N/A 05/08/2016   Procedure: Right Heart Cath;  Surgeon: Jolaine Artist, MD;  [facility];; RA = 3, RV = 34/5, PA = 38/12 (21), PCW = 12; Fick cardiac output/index = 4.6/2.6; PVR = 1.9 WU, Ao sat = 96%; PA sat = 58%, 62%   TRANSTHORACIC ECHOCARDIOGRAM  10/'17; 3/'18   a) EF 40 and 45%.  GRII DD; b) 04/2016: EF 20% with grade 2 diastolic dysfunction   TRANSTHORACIC ECHOCARDIOGRAM  10/2016   EF 55-60%.  Normal LV function.  Mild diastolic dysfunction.  Normal valve function   TRIGGER FINGER RELEASE Left 04/23/2017   Procedure: RELEASE TRIGGER FINGER  TO LEFT FORTH FINGER;  Surgeon: Cristine Polio, MD;  Location: [place];  Service: Plastics;  Laterality: Left;    FAMILY HISTORY Family History  Problem Relation Age of Onset   Stroke Father     SOCIAL HISTORY Social History   Tobacco Use   Smoking status: Former Smoker    Years: 30.00    Quit date: 09/29/2012    Years since quitting: 6.9   Smokeless tobacco: Never Used  Substance Use Topics   Alcohol use: No   Drug use: No         OPHTHALMIC EXAM:  Base Eye Exam    Visual Acuity (Snellen - Linear)      Right Left   Dist [place] 20/20 CF @ 3'   Dist ph   NI       Tonometry (Tonopen, 10:49 AM)      Right Left   Pressure 12 9       Pupils      Pupils Dark Light Shape React APD   Right PERRL 4 3 Round Brisk None   Left PERRL 4 3 Round Brisk None       Visual Fields (Counting fingers)      Left Right    Full Full       Extraocular Movement      Right Left    Full Full       Neuro/Psych    Oriented x3: Yes   Mood/Affect: Normal       Dilation    Left eye: 1.0% Mydriacyl, 2.5% Phenylephrine @ 10:49 AM        Slit Lamp and Fundus Exam    External Exam      Right Left   External Normal Normal       Slit Lamp Exam      Right Left   Lids/Lashes Normal Normal   Conjunctiva/Sclera White and quiet White and quiet   Cornea Clear Clear   Anterior Chamber Deep and quiet Deep and quiet   Iris Round and reactive Round and reactive   Lens Posterior chamber intraocular lens Posterior chamber intraocular lens   Anterior Vitreous Normal Normal       Fundus Exam      Right Left   Posterior Vitreous  Normal   Disc  Normal   C/D Ratio  0.25   Macula  Mottling, Subretinal fibrosis, Atrophy, Retinal pigment epithelial atrophy, Geographic atrophy, Pigmented atrophy,, no srf   Vessels  Normal   Periphery  Normal          IMAGING AND PROCEDURES  Imaging and Procedures for 09/16/19  OCT, Retina -  OU - Both Eyes       Right  Eye Quality was good. Scan locations included subfoveal. Central Foveal Thickness: 240. Progression has been stable. Findings include no SRF, retinal drusen , abnormal foveal contour.   Left Eye Quality was good. Scan locations included subfoveal. Central Foveal Thickness: 172. Progression has improved. Findings include abnormal foveal contour, outer retinal atrophy, inner retinal atrophy, disciform scar.   Notes OS, improved perifoveal CNVM on intravitreal Avastin, at 9-week interval.  Repeat intravitreal injection today and examination in 3 months       Intravitreal Injection, Pharmacologic Agent - OS - Left Eye       Time Out 09/16/2019. 11:28 AM. Confirmed correct patient, procedure, site, and patient consented.   Anesthesia Topical anesthesia was used. Anesthetic medications included Akten 3.5%.   Procedure Preparation included Ofloxacin , 10% betadine to eyelids, 5% betadine to ocular surface. A 30 gauge needle was used.   Injection:  1.25 mg Bevacizumab (AVASTIN) SOLN   NDC: [address]-8, Lot: [id number]   Route: Intravitreal, Site: Left Eye, Waste: 0 mg  Post-op Post injection exam found visual acuity of at least counting fingers. The patient tolerated the procedure well. There were no complications. The patient received written and verbal post procedure care education. Post injection medications were not given.                 ASSESSMENT/PLAN:  Exudative age-related macular degeneration of left eye with active choroidal neovascularization (HCC) OS, improved perifoveal CNVM on intravitreal Avastin, at 9-week interval.  Repeat intravitreal injection today and examination in 3 months      ICD-10-CM   1. Exudative age-related macular degeneration of left eye with active choroidal neovascularization (HCC)  H35.3221 OCT, Retina - OU - Both Eyes    Intravitreal Injection, Pharmacologic Agent - OS - Left Eye    Bevacizumab (AVASTIN) SOLN 1.25 mg    1.OS, improved  perifoveal CNVM on intravitreal Avastin, at 9-week interval.  Repeat intravitreal injection today and examination in 3 months  Goal of controlling scotoma size is currently been met.  Will extend interval examination to determine whether the therapy is required  2.  3.  Ophthalmic Meds Ordered this visit:  Meds ordered this encounter  Medications   Bevacizumab (AVASTIN) SOLN 1.25 mg       Return in about 3 months (around 12/17/2019) for DILATE OU, COLOR FP, OCT.  There are no Patient Instructions on file for this visit.   Explained the diagnoses, plan, and follow up with the patient and they expressed understanding.  Patient expressed understanding of the importance of proper follow up care.   Clent Demark Kieran Nachtigal M.D. Diseases & Surgery of the Retina and Vitreous Retina & Diabetic [place] 09/16/19     Abbreviations: M myopia (nearsighted); A astigmatism; H hyperopia (farsighted); P presbyopia; Mrx spectacle prescription;  CTL contact lenses; OD right eye; OS left eye; OU both eyes  XT exotropia; ET esotropia; PEK punctate epithelial keratitis; PEE punctate epithelial erosions; DES dry eye syndrome; MGD meibomian gland dysfunction; ATs artificial tears; PFAT's preservative free artificial tears; [place] nuclear sclerotic cataract; PSC posterior subcapsular cataract; ERM epi-retinal membrane; PVD posterior vitreous detachment; RD retinal detachment; DM diabetes mellitus; DR diabetic retinopathy; NPDR non-proliferative diabetic retinopathy; PDR proliferative diabetic retinopathy; CSME clinically significant macular edema; DME diabetic macular edema; dbh dot blot hemorrhages; CWS cotton wool spot; POAG primary open angle glaucoma; C/D cup-to-disc ratio; HVF humphrey visual field; GVF goldmann visual field; OCT optical coherence  tomography; IOP intraocular pressure; BRVO Branch retinal vein occlusion; CRVO central retinal vein occlusion; CRAO central retinal artery occlusion; BRAO branch  retinal artery occlusion; RT retinal tear; SB scleral buckle; PPV pars plana vitrectomy; VH Vitreous hemorrhage; PRP panretinal laser photocoagulation; IVK intravitreal kenalog; VMT vitreomacular traction; MH Macular hole;  NVD neovascularization of the disc; NVE neovascularization elsewhere; AREDS age related eye disease study; ARMD age related macular degeneration; POAG primary open angle glaucoma; EBMD epithelial/anterior basement membrane dystrophy; ACIOL anterior chamber intraocular lens; IOL intraocular lens; PCIOL posterior chamber intraocular lens; Phaco/IOL phacoemulsification with intraocular lens placement; [place] photorefractive keratectomy; LASIK laser assisted in situ keratomileusis; HTN hypertension; DM diabetes mellitus; COPD chronic obstructive pulmonary disease

## 2019-09-22 ENCOUNTER — Ambulatory Visit (INDEPENDENT_AMBULATORY_CARE_PROVIDER_SITE_OTHER): Payer: Medicare Other | Admitting: Cardiology

## 2019-09-22 ENCOUNTER — Encounter: Payer: Self-pay | Admitting: Cardiology

## 2019-09-22 ENCOUNTER — Other Ambulatory Visit: Payer: Self-pay

## 2019-09-22 VITALS — BP 128/72 | HR 70 | Ht 63.5 in | Wt 142.6 lb

## 2019-09-22 DIAGNOSIS — I5032 Chronic diastolic (congestive) heart failure: Secondary | ICD-10-CM | POA: Diagnosis not present

## 2019-09-22 DIAGNOSIS — I428 Other cardiomyopathies: Secondary | ICD-10-CM | POA: Diagnosis not present

## 2019-09-22 DIAGNOSIS — E785 Hyperlipidemia, unspecified: Secondary | ICD-10-CM

## 2019-09-22 DIAGNOSIS — R002 Palpitations: Secondary | ICD-10-CM

## 2019-09-22 DIAGNOSIS — E1169 Type 2 diabetes mellitus with other specified complication: Secondary | ICD-10-CM | POA: Diagnosis not present

## 2019-09-22 NOTE — Patient Instructions (Addendum)
Medication Instructions:   WEANING OFF  FUROSEMIDE TAKE 1/2 TABLET  DAILY FOR  2 WEEKS ,THEN TAKE 1/2 TABLET FOR EVERY OTHER DAY IF YOU ARE HAVING NO SYMPTOMS   MAY YOU MEDICATION  ON AN AS NEEDED BASIS.  *If you need a refill on your cardiac medications before your next appointment, please call your pharmacy*   Lab Work: NOT NEEDED     Testing/Procedures: NOT NEEDED  Follow-Up: At [facility], you and your health needs are our priority.  As part of our continuing mission to provide you with exceptional heart care, we have created designated Provider Care Teams.  These Care Teams include your primary Cardiologist (physician) and Advanced Practice Providers (APPs -  Physician Assistants and Nurse Practitioners) who all work together to provide you with the care you need, when you need it.    Your next appointment:   12 month(s)  The format for your next appointment:   In Person  Provider:   Glenetta Hew, MD   Other Instructions

## 2019-09-22 NOTE — Progress Notes (Signed)
Primary Care Provider: Patient, No Pcp Per Cardiologist: Ashley Hew, MD Electrophysiologist: None  Clinic Note: Chief Complaint  Patient presents with  . Follow-up    79-month  . Cardiomyopathy    Resolved nonischemic cardiomyopathy on current medications.  Doing well.    HPI:    [name] is a 74 y.o. female with a PMH notable for CHRONIC HFPEF after resolved NONISCHEMIC CARDIOMYOPATHY (with CHRONIC COMBINED SYSTOLIC AND DIASTOLIC HEART FAILURE) who presents today for 92-month follow-up.   11/2015: Admitted for A/C combined CHF  ? Echo: EF 40 to 45%.   ? Cath:  Mild to moderate nonobstructive disease.  Borderline elevated LVEDP.  04/25/2016: Admitted again for AV combined CHF ? March 2018 echo with a EF of 20%, GRII DD. -->  Aggressively diuresed ? RHC done upon completion of diuresis showed well compensated numbers. ? Thought to be either Takotsubo related versus frequent PVC related.  Most recent Echocardiogram September 2018: EF back to 55-60%.  GR 1 DD   Transition from CHF clinic to general cardiology in February 2019 -> is on Entresto, carvedilol, spironolactone along with as needed Lasix.  [name] was last seen on November 19th 2020 overall doing very well.  She had reduced her Lasix down to once a day and was not having any issues with swelling.  No PND orthopnea.  Her diabetes again little better control though.  Recent Hospitalizations: None  Reviewed  CV studies:    The following studies were reviewed today: (if available, images/films reviewed: From Epic Chart or Care Everywhere) . None:  Interval History:   [name] presents here today quite a bit stressed out.  She has been dealing with a lot of issues but her best friend's husband just passed away and she has been quite upset as a result of this.  She is also dealing with macular degeneration issues. She asked about whether or not she needs to be on these medicines still.  But is actually been  doing quite well overall from a cardiac standpoint.  She has not had any real exertional dyspnea or chest pain.  No PND, orthopnea or edema.  She is not having any swelling whatsoever. Her energy level seems to be doing fine, and she is actually able to exercise some with walking.  CV Review of Symptoms (Summary) Cardiovascular ROS: no chest pain or dyspnea on exertion negative for - edema, irregular heartbeat, orthopnea, palpitations, paroxysmal nocturnal dyspnea, rapid heart rate, shortness of breath or Lightheadedness, dizziness, wooziness or syncope/near syncope, TIA/amaurosis fugax, claudication.  The patient does not have symptoms concerning for COVID-19 infection (fever, chills, cough, or new shortness of breath).  The patient is practicing social distancing & Masking.   She has had both her COVID-19 vaccine injections.  No issues.  REVIEWED OF SYSTEMS   Review of Systems  Constitutional: Negative for malaise/fatigue and weight loss.  HENT: Negative for congestion and nosebleeds.   Eyes:       Worsening vision issues with macular degeneration  Cardiovascular: Negative for chest pain, palpitations and claudication.  Musculoskeletal: Negative for back pain, falls and joint pain.  Neurological: Negative for dizziness.  Psychiatric/Behavioral: Negative for memory loss. The patient does not have insomnia.        She is under a lot of social stress issues right now.  Difficult dealing with her vision issues with macular degeneration but also her very close friend's husband just died and that has been a very stressful  issue for her.   I have reviewed and (if needed) personally updated the patient's problem list, medications, allergies, past medical and surgical history, social and family history.   PAST MEDICAL HISTORY   Past Medical History:  Diagnosis Date  . Breast cancer ([place])    breast - left   . Chronic combined systolic and diastolic CHF (congestive heart failure) (H[facility])  -Systolic Dysfunction resolved.    a. cMRI 5/10: EF 38% // b. Echo 3/13/: EF 50-55, Gr 1 DD // c. Echo 8/14: EF 40, inf/inf-septal HK, Gr 1 DD, mild MR // d. Echo 5/17: EF 40-45, inf HK, Gr 1 DD, mild MR, mild LAE, reduced RVSF, mild TR, PASP 33 // e. Echo 10/17: EF 40-45, Gr 2 DD, mild MR, severe LAE  . Coronary artery disease, non-occlusive    a. nonobs by LHC in 2010 // b. Myoview 10/17: Lg infarct apex, distal ant and lat walls, no ischemia, EF 38; int risk  // c. [place] 10/17:  dLAD 15, pLCx 30, mRCA 40, LVEDP 15  . Diabetes mellitus   . NICM (nonischemic cardiomyopathy) ([place])     PAST SURGICAL HISTORY   Past Surgical History:  Procedure Laterality Date  . APPENDECTOMY    . BREAST LUMPECTOMY     lt breast  . CARDIAC CATHETERIZATION N/A 12/20/2015   Procedure: Left Heart Cath and Coronary Angiography;  Surgeon: Nelva Bush, MD:: Mild to moderate, nonobstructive coronary artery disease, including 15% distal LAD stenosis, 30% ulcerated mid LCx disease, and 40% mid RCA stenosis.  Upper normal left ventricular filling pressure (LVEDP 15 mmHg).  [facility] RIGHT HEART CATH N/A 05/08/2016   Procedure: Right Heart Cath;  Surgeon: Jolaine Artist, MD;  Location: [facility];; RA = 3, RV = 34/5, PA = 38/12 (21), PCW = 12; Fick cardiac output/index = 4.6/2.6; PVR = 1.9 WU, Ao sat = 96%; PA sat = 58%, 62%  . TRANSTHORACIC ECHOCARDIOGRAM  10/'17; 3/'18   a) EF 40 and 45%.  GRII DD; b) 04/2016: EF 20% with grade 2 diastolic dysfunction  . TRANSTHORACIC ECHOCARDIOGRAM  10/2016   EF 55-60%.  Normal LV function.  Mild diastolic dysfunction.  Normal valve function  . TRIGGER FINGER RELEASE Left 04/23/2017   Procedure: RELEASE TRIGGER FINGER TO LEFT FORTH FINGER;  Surgeon: Cristine Polio, MD;  Location: [facility];  Service: Plastics;  Laterality: Left;    MEDICATIONS/ALLERGIES   Current Meds  Medication Sig  . atorvastatin (LIPITOR) 40 MG tablet Take 1 tablet (40 mg total) by mouth  daily.  . carvedilol (COREG) 6.25 MG tablet Take 1 tablet (6.25 mg total) by mouth 2 (two) times daily.  . furosemide (LASIX) 40 MG tablet Take 1 tablet (40 mg total) by mouth 2 (two) times daily.  . insulin NPH-regular Human (NOVOLIN 70/30) (70-30) 100 UNIT/ML injection Please start using 25 units in am and 15 units at bedtime. Check CBG's three times a day (including fasting in am)  . sacubitril-valsartan (ENTRESTO) 49-51 MG Take 1 tablet by mouth 2 (two) times daily.    Allergies  Allergen Reactions  . Tape Rash    Pt can tolerate the paper tape    SOCIAL HISTORY/FAMILY HISTORY   Reviewed in Epic:  Pertinent findings:n/a  OBJCTIVE -PE, EKG, labs   Wt Readings from Last 3 Encounters:  09/22/19 142 lb 9.6 oz (64.7 kg)  01/09/19 155 lb (70.3 kg)  06/07/18 143 lb (64.9 kg)    Physical Exam: BP 128/72  Pulse 70   Ht 5' 3.5" (1.613 m)   Wt 142 lb 9.6 oz (64.7 kg)   SpO2 99%   BMI 24.86 kg/m  Physical Exam Constitutional:      General: She is not in acute distress.    Appearance: Normal appearance. She is normal weight. She is not ill-appearing.  HENT:     Head: Normocephalic and atraumatic.  Neck:     Vascular: No carotid bruit.     Comments: No JVD or HJR Cardiovascular:     Rate and Rhythm: Normal rate and regular rhythm.     Pulses: Normal pulses.     Heart sounds: Normal heart sounds. No murmur heard.  No friction rub.     Comments: No ectopy.  Nondisplaced PMI Pulmonary:     Effort: Pulmonary effort is normal. No respiratory distress.     Breath sounds: Normal breath sounds.  Musculoskeletal:        General: No swelling.     Cervical back: Normal range of motion and neck supple.  Neurological:     General: No focal deficit present.     Mental Status: She is alert and oriented to person, place, and time.  Psychiatric:        Mood and Affect: Mood normal.        Behavior: Behavior normal.        Thought Content: Thought content normal.        Judgment:  Judgment normal.     Comments: She does seem a little bit more stressed little anxious today.  A little bit emotionally upset     Adult ECG Report  Rate: 70 ;  Rhythm: normal sinus rhythm, premature ventricular contractions (PVC) and Left axis deviation(-47). Septal MI- age undetermined.  ;   Narrative Interpretation: stable  Recent Labs:  stable  Lab Results  Component Value Date   CHOL 189 01/09/2019   HDL 61 01/09/2019   LDLCALC 100 (H) 01/09/2019   TRIG 164 (H) 01/09/2019   CHOLHDL 3.1 01/09/2019   Lab Results  Component Value Date   CREATININE 1.66 (H) 01/09/2019   BUN 36 (H) 01/09/2019   NA 139 01/09/2019   K 5.5 (H) 01/09/2019   CL 102 01/09/2019   CO2 22 01/09/2019   Lab Results  Component Value Date   TSH 1.268 03/17/2016    ASSESSMENT/PLAN    Problem List Items Addressed This Visit    Chronic diastolic heart failure ([place]) (Chronic)    She does have a definite improvement in the EF on her echo, but still has diastolic dysfunction.  Class I CHF symptoms now.  Totally euvolemic on exam.  I have a little leery of stopping Entresto and carvedilol.  We have come off of spironolactone I think we can probably make Lasix a as needed medication.  (This will be done in a gradual manner).  Going forward she will simply take it sliding scale for weight gain.       Hyperlipidemia associated with type 2 diabetes mellitus ([place]) (Chronic)    Labs were checked last visit.  In the absence of having active coronary disease and ultimately to be more aggressive than an LDL of less than 100.  Most recently was 100.  She is on atorvastatin 40 mg daily.  She should have at least annual follow-up of labs.  If labs not checked on follow-up exam with me next year we will recheck at that time.  I think we may build back  down statin if we get better control, but for now with LDL of 100 she just barely at goal.        Palpitations (Chronic)    Pretty much asymptomatic at this point.   She does have PVCs.  Is on carvedilol which seems to have everything under control.      Relevant Orders   EKG 12-Lead (Completed)   NICM (nonischemic cardiomyopathy) ([facility]) - Primary (Chronic)    Essentially resolved with totally improved EF.  She is asymptomatic and euvolemic.  She is on aggressive management which I think to maintain at least for now which she continue with carvedilol and Entresto.  After another years of stability, we can potentially consider backing down to simply an ARB from [place], but I would like at least another year stability.      Relevant Orders   EKG 12-Lead (Completed)       COVID-19 Education: The signs and symptoms of COVID-19 were discussed with the patient and how to seek care for testing (follow up with PCP or arrange E-visit).   The importance of social distancing and COVID-19 vaccination was discussed today.  I spent a total of 56minutes with the patient. >  50% of the time was spent in direct patient consultation.  Additional time spent with chart review  / charting (studies, outside notes, etc): 6 Total Time: 24 min   Current medicines are reviewed at length with the patient today.  (+/- concerns) none  Notice: This dictation was prepared with Dragon dictation along with smaller phrase technology. Any transcriptional errors that result from this process are unintentional and may not be corrected upon review.  Patient Instructions / Medication Changes & Studies & Tests Ordered   Patient Instructions  Medication Instructions:   WEANING OFF  FUROSEMIDE TAKE 1/2 TABLET  DAILY FOR  2 WEEKS ,THEN TAKE 1/2 TABLET FOR EVERY OTHER DAY IF YOU ARE HAVING NO SYMPTOMS   MAY YOU MEDICATION  ON AN AS NEEDED BASIS.  *If you need a refill on your cardiac medications before your next appointment, please call your pharmacy*   Lab Work: NOT NEEDED     Testing/Procedures: NOT NEEDED  Follow-Up: At [facility], you and your health needs are our  priority.  As part of our continuing mission to provide you with exceptional heart care, we have created designated Provider Care Teams.  These Care Teams include your primary Cardiologist (physician) and Advanced Practice Providers (APPs -  Physician Assistants and Nurse Practitioners) who all work together to provide you with the care you need, when you need it.    Your next appointment:   12 month(s)  The format for your next appointment:   In Person  Provider:   Glenetta Hew, MD   Other Instructions     Studies Ordered:   Orders Placed This Encounter  Procedures  . EKG 12-Lead     [name], M.D., M.S. Interventional Cardiologist   Pager # [phone] Phone # [phone] [address] [place], [place] [address]   Thank you for choosing [facility] at [facility]!!

## 2019-09-26 ENCOUNTER — Encounter: Payer: Self-pay | Admitting: Cardiology

## 2019-09-26 NOTE — Assessment & Plan Note (Signed)
Essentially resolved with totally improved EF.  She is asymptomatic and euvolemic.  She is on aggressive management which I think to maintain at least for now which she continue with carvedilol and Entresto.  After another years of stability, we can potentially consider backing down to simply an ARB from [place], but I would like at least another year stability.

## 2019-09-26 NOTE — Assessment & Plan Note (Signed)
She does have a definite improvement in the EF on her echo, but still has diastolic dysfunction.  Class I CHF symptoms now.  Totally euvolemic on exam.  I have a little leery of stopping Entresto and carvedilol.  We have come off of spironolactone I think we can probably make Lasix a as needed medication.  (This will be done in a gradual manner).  Going forward she will simply take it sliding scale for weight gain.

## 2019-09-26 NOTE — Assessment & Plan Note (Addendum)
Labs were checked last visit.  In the absence of having active coronary disease and ultimately to be more aggressive than an LDL of less than 100.  Most recently was 100.  She is on atorvastatin 40 mg daily.  She should have at least annual follow-up of labs.  If labs not checked on follow-up exam with me next year we will recheck at that time.  I think we may build back down statin if we get better control, but for now with LDL of 100 she just barely at goal.

## 2019-09-26 NOTE — Assessment & Plan Note (Signed)
Pretty much asymptomatic at this point.  She does have PVCs.  Is on carvedilol which seems to have everything under control.

## 2019-12-18 ENCOUNTER — Encounter (INDEPENDENT_AMBULATORY_CARE_PROVIDER_SITE_OTHER): Payer: Medicare Other | Admitting: Ophthalmology

## 2019-12-18 ENCOUNTER — Other Ambulatory Visit: Payer: Self-pay

## 2020-01-19 ENCOUNTER — Other Ambulatory Visit: Payer: Self-pay

## 2020-01-19 ENCOUNTER — Encounter (INDEPENDENT_AMBULATORY_CARE_PROVIDER_SITE_OTHER): Payer: Self-pay | Admitting: Ophthalmology

## 2020-01-19 ENCOUNTER — Ambulatory Visit (INDEPENDENT_AMBULATORY_CARE_PROVIDER_SITE_OTHER): Payer: Medicare Other | Admitting: Ophthalmology

## 2020-01-19 DIAGNOSIS — E113393 Type 2 diabetes mellitus with moderate nonproliferative diabetic retinopathy without macular edema, bilateral: Secondary | ICD-10-CM | POA: Diagnosis not present

## 2020-01-19 DIAGNOSIS — H353222 Exudative age-related macular degeneration, left eye, with inactive choroidal neovascularization: Secondary | ICD-10-CM

## 2020-01-19 DIAGNOSIS — E119 Type 2 diabetes mellitus without complications: Secondary | ICD-10-CM | POA: Insufficient documentation

## 2020-01-19 NOTE — Assessment & Plan Note (Signed)

## 2020-01-19 NOTE — Assessment & Plan Note (Signed)
The nature of moderate nonproliferative diabetic retinopathy was discussed with the patient as well as the need for more frequent follow up to judge for progression. Good blood glucose, blood pressure, and serum lipid control was recommended as well as avoidance of smoking and maintenance of normal weight.  Close follow up with PCP encouraged. °

## 2020-01-19 NOTE — Progress Notes (Signed)
01/19/2020     CHIEF COMPLAINT Patient presents for Retina Follow Up   HISTORY OF PRESENT ILLNESS: [name] is a 74 y.o. female who presents to the clinic today for:   HPI    Retina Follow Up    Patient presents with  Wet AMD.  In both eyes.  This started 4 months ago.  Severity is mild.  Duration of 4 months.  Since onset it is stable.          Comments    4 mo F/U OU  Pt reports stable vision, no new F/F, no pain or pressure.     Last A1C: unknown    Last BS: unknown, but stated "high" (pt states around 600) took at 11 AM today.        Last edited by Nichola Sizer D on 01/19/2020  3:43 PM. (History)      Referring physician: No referring provider defined for this encounter.  HISTORICAL INFORMATION:   Selected notes from the MEDICAL RECORD NUMBER    Lab Results  Component Value Date   HGBA1C 11.4 (H) 01/09/2019     CURRENT MEDICATIONS: No current outpatient medications on file. (Ophthalmic Drugs)   No current facility-administered medications for this visit. (Ophthalmic Drugs)   Current Outpatient Medications (Other)  Medication Sig   atorvastatin (LIPITOR) 40 MG tablet Take 1 tablet (40 mg total) by mouth daily.   carvedilol (COREG) 6.25 MG tablet Take 1 tablet (6.25 mg total) by mouth 2 (two) times daily.   furosemide (LASIX) 40 MG tablet Take 1 tablet (40 mg total) by mouth 2 (two) times daily.   insulin NPH-regular Human (NOVOLIN 70/30) (70-30) 100 UNIT/ML injection Please start using 25 units in am and 15 units at bedtime. Check CBG's three times a day (including fasting in am)   sacubitril-valsartan (ENTRESTO) 49-51 MG Take 1 tablet by mouth 2 (two) times daily.   No current facility-administered medications for this visit. (Other)      REVIEW OF SYSTEMS:    ALLERGIES Allergies  Allergen Reactions   Tape Rash    Pt can tolerate the paper tape    PAST MEDICAL HISTORY Past Medical History:  Diagnosis Date   Breast  cancer ([place])    breast - left    Chronic combined systolic and diastolic CHF (congestive heart failure) (HCC) -Systolic Dysfunction resolved.    a. cMRI 5/10: EF 38% // b. Echo 3/13/: EF 50-55, Gr 1 DD // c. Echo 8/14: EF 40, inf/inf-septal HK, Gr 1 DD, mild MR // d. Echo 5/17: EF 40-45, inf HK, Gr 1 DD, mild MR, mild LAE, reduced RVSF, mild TR, PASP 33 // e. Echo 10/17: EF 40-45, Gr 2 DD, mild MR, severe LAE   Coronary artery disease, non-occlusive    a. nonobs by LHC in 2010 // b. Myoview 10/17: Lg infarct apex, distal ant and lat walls, no ischemia, EF 38; int risk  // c. [place] 10/17:  dLAD 15, pLCx 33, mRCA 40, LVEDP 15   Diabetes mellitus    NICM (nonischemic cardiomyopathy) ([place])    Past Surgical History:  Procedure Laterality Date   APPENDECTOMY     BREAST LUMPECTOMY     lt breast   CARDIAC CATHETERIZATION N/A 12/20/2015   Procedure: Left Heart Cath and Coronary Angiography;  Surgeon: Nelva Bush, MD:: Mild to moderate, nonobstructive coronary artery disease, including 15% distal LAD stenosis, 30% ulcerated mid LCx disease, and 40% mid RCA stenosis.  Upper normal  left ventricular filling pressure (LVEDP 15 mmHg).   RIGHT HEART CATH N/A 05/08/2016   Procedure: Right Heart Cath;  Surgeon: Jolaine Artist, MD;  Location: [facility];; RA = 3, RV = 34/5, PA = 38/12 (21), PCW = 12; Fick cardiac output/index = 4.6/2.6; PVR = 1.9 WU, Ao sat = 96%; PA sat = 58%, 62%   TRANSTHORACIC ECHOCARDIOGRAM  10/'17; 3/'18   a) EF 40 and 45%.  GRII DD; b) 04/2016: EF 20% with grade 2 diastolic dysfunction   TRANSTHORACIC ECHOCARDIOGRAM  10/2016   EF 55-60%.  Normal LV function.  Mild diastolic dysfunction.  Normal valve function   TRIGGER FINGER RELEASE Left 04/23/2017   Procedure: RELEASE TRIGGER FINGER TO LEFT FORTH FINGER;  Surgeon: Cristine Polio, MD;  Location: [place];  Service: Plastics;  Laterality: Left;    FAMILY HISTORY Family History  Problem  Relation Age of Onset   Stroke Father     SOCIAL HISTORY Social History   Tobacco Use   Smoking status: Former Smoker    Years: 30.00    Quit date: 09/29/2012    Years since quitting: 7.3   Smokeless tobacco: Never Used  Substance Use Topics   Alcohol use: No   Drug use: No         OPHTHALMIC EXAM:  Base Eye Exam    Visual Acuity (ETDRS)      Right Left   Dist [place] 20/25 +2 CF at 3'       Tonometry (Tonopen, 3:49 PM)      Right Left   Pressure 14 14       Pupils      Pupils Dark Light Shape React APD   Right PERRL 4 3 Round Brisk None   Left PERRL 4 3 Round Brisk None       Visual Fields      Left Right    Full Full       Extraocular Movement      Right Left    Full Full       Neuro/Psych    Oriented x3: Yes   Mood/Affect: Normal       Dilation    Both eyes: 1.0% Mydriacyl, 2.5% Phenylephrine @ 3:49 PM        Slit Lamp and Fundus Exam    External Exam      Right Left   External Normal Normal       Slit Lamp Exam      Right Left   Lids/Lashes Normal Normal   Conjunctiva/Sclera White and quiet White and quiet   Cornea Clear Clear   Anterior Chamber Deep and quiet Deep and quiet   Iris Round and reactive Round and reactive   Lens Posterior chamber intraocular lens Posterior chamber intraocular lens   Anterior Vitreous Normal Normal       Fundus Exam      Right Left   Posterior Vitreous Posterior vitreous detachment Normal   Disc Normal Normal   C/D Ratio 0.35 0.25   Macula Normal Mottling, Subretinal fibrosis, Atrophy, Retinal pigment epithelial atrophy, Geographic atrophy, Pigmented atrophy,, no srf   Vessels Normal Normal   Periphery Normal Normal          IMAGING AND PROCEDURES  Imaging and Procedures for 01/19/20  OCT, Retina - OU - Both Eyes       Right Eye Quality was good. Scan locations included subfoveal. Central Foveal Thickness: 242. Findings include normal foveal  contour.   Left Eye Quality was poor. Scan  locations included subfoveal. Central Foveal Thickness: 156. Findings include abnormal foveal contour, subretinal scarring, disciform scar, vitreomacular adhesion .   Notes No active CNVM OS at this time will continue to observe       Color Fundus Photography Optos - OU - Both Eyes       Right Eye Progression has been stable. Macula : normal observations. Vessels : normal observations. Periphery : normal observations.   Left Eye Progression has been stable. Disc findings include normal observations. Macula : geographic atrophy. Vessels : normal observations. Periphery : normal observations.   Notes Moderate nonproliferative diabetic retinopathy detectable on clinical examination not evident on color fundus photography  Old disciform macular scar left eye with large trunk of subretinal mature CNVM, not active disease.                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 Good blood glucose, blood pressure, and serum lipid control was recommended as well as avoidance of smoking and maintenance of normal weight.  Close follow up with PCP encouraged.  Diabetes mellitus without complication ([place]) The patient has diabetes without any evidence of retinopathy. The patient advised to maintain good blood glucose control, excellent blood pressure control, and favorable levels of cholesterol, low density lipoprotein, and high density lipoproteins. Follow up in 1 year was recommended. Explained that fluctuations in visual acuity , or "out of focus", may result from large variations of blood sugar control.      ICD-10-CM   1. Moderate  nonproliferative diabetic retinopathy of both eyes without macular edema associated with type 2 diabetes mellitus (HCC)  M08.6761 OCT, Retina - OU - Both Eyes    Color Fundus Photography Optos - OU - Both Eyes  2. Exudative age-related macular degeneration of left eye with inactive choroidal neovascularization ([place])  H35.3222     1.  Moderate nonproliferative diabetic retinopathy in each eye  2.  History of active CNVM OS, now quiet  3.  Ophthalmic Meds Ordered this visit:  No orders of the defined types were placed in this encounter.      Return in about 4 months (around 05/18/2020) for DILATE OU, COLOR FP, OCT.  Patient Instructions  Patient instructed to contact the office promptly for new onset visual acuity declines or distortions    Explained the diagnoses, plan, and follow up with the patient and they expressed understanding.  Patient expressed understanding of the importance of proper follow up care.   Clent Demark Cana Mignano M.D. Diseases & Surgery of the Retina and Vitreous [facility] [place] 01/19/20     Abbreviations: M myopia (nearsighted); A astigmatism; H hyperopia (farsighted); P presbyopia; Mrx spectacle prescription;  CTL contact lenses; OD right eye; OS left eye; OU both eyes  XT exotropia; ET esotropia; PEK punctate epithelial keratitis; PEE punctate epithelial erosions; DES dry eye syndrome; MGD meibomian gland dysfunction; ATs artificial tears; PFAT's preservative free artificial tears; [place] nuclear sclerotic cataract; PSC posterior subcapsular cataract; ERM epi-retinal membrane; PVD posterior vitreous detachment; RD retinal detachment; DM diabetes mellitus; DR diabetic retinopathy; NPDR non-proliferative diabetic retinopathy; PDR proliferative diabetic retinopathy; CSME clinically significant macular edema; DME diabetic macular edema;  dbh dot blot hemorrhages; CWS cotton wool spot; POAG primary open angle glaucoma; C/D cup-to-disc ratio; HVF humphrey visual  field; GVF goldmann visual field; OCT optical coherence tomography; IOP intraocular pressure; BRVO Branch retinal vein occlusion; CRVO central retinal vein occlusion; CRAO central retinal artery occlusion; BRAO branch retinal artery occlusion; RT retinal tear; SB scleral buckle; PPV pars plana vitrectomy; VH Vitreous hemorrhage; PRP panretinal laser photocoagulation; IVK intravitreal kenalog; VMT vitreomacular traction; MH Macular hole;  NVD neovascularization of the disc; NVE neovascularization elsewhere; AREDS age related eye disease study; ARMD age related macular degeneration; POAG primary open angle glaucoma; EBMD epithelial/anterior basement membrane dystrophy; ACIOL anterior chamber intraocular lens; IOL intraocular lens; PCIOL posterior chamber intraocular lens; Phaco/IOL phacoemulsification with intraocular lens placement; [place] photorefractive keratectomy; LASIK laser assisted in situ keratomileusis; HTN hypertension; DM diabetes mellitus; COPD chronic obstructive pulmonary disease

## 2020-01-19 NOTE — Patient Instructions (Signed)
Patient instructed to contact the office promptly for new onset visual acuity declines or distortions 

## 2020-01-19 NOTE — Assessment & Plan Note (Signed)
Subfoveal disciform scar, not active

## 2020-03-05 ENCOUNTER — Telehealth: Payer: Self-pay | Admitting: *Deleted

## 2020-03-05 NOTE — Telephone Encounter (Signed)
Called left message that paper for patient assistance is ready [employer]  . Awaiting patient's other forms to  Be gathered - proof income, copies of all insurance cards

## 2020-04-06 NOTE — Telephone Encounter (Signed)
Patient came by the office  Recently to give  A copy of income. No copy of insurance.  completed Patient assistance form  and faxed all information to  [employer]  For [place]  Re-enrollment

## 2020-04-08 ENCOUNTER — Telehealth: Payer: Self-pay | Admitting: Cardiology

## 2020-04-08 MED ORDER — CARVEDILOL 6.25 MG PO TABS: 6.2500 mg | ORAL_TABLET | Freq: Two times a day (BID) | ORAL | 3 refills | Status: DC

## 2020-04-08 NOTE — Telephone Encounter (Signed)
Refill sent to pt's pharmacy. 

## 2020-04-08 NOTE — Telephone Encounter (Signed)
*  STAT* If patient is at the pharmacy, call can be transferred to refill team.   1. Which medications need to be refilled? (please list name of each medication and dose if known) carvedilol (COREG) 6.25 MG tablet  2. Which pharmacy/location (including street and city if local pharmacy) is medication to be sent to? [place], [place]  3. Do they need a 30 day or 90 day supply? 90 day supply

## 2020-04-13 NOTE — Telephone Encounter (Signed)
Returned call to patient left message on personal voice mail Dr.Harding's RN is out of office today.I will send message to her.

## 2020-04-13 NOTE — Telephone Encounter (Signed)
Patient states she received a letter stating [employer] has not received the the fax for entresto re-enrollment. She states she does not need a call back.

## 2020-04-20 IMAGING — CR DG CHEST 2V
2 series · 2 of 2 positions shown · non-contrast
Comparison: May 15, 2017

CLINICAL DATA: Shortness of breath and wheezing. History of breast
carcinoma

EXAM:
CHEST - 2 VIEW

[w chest pa]
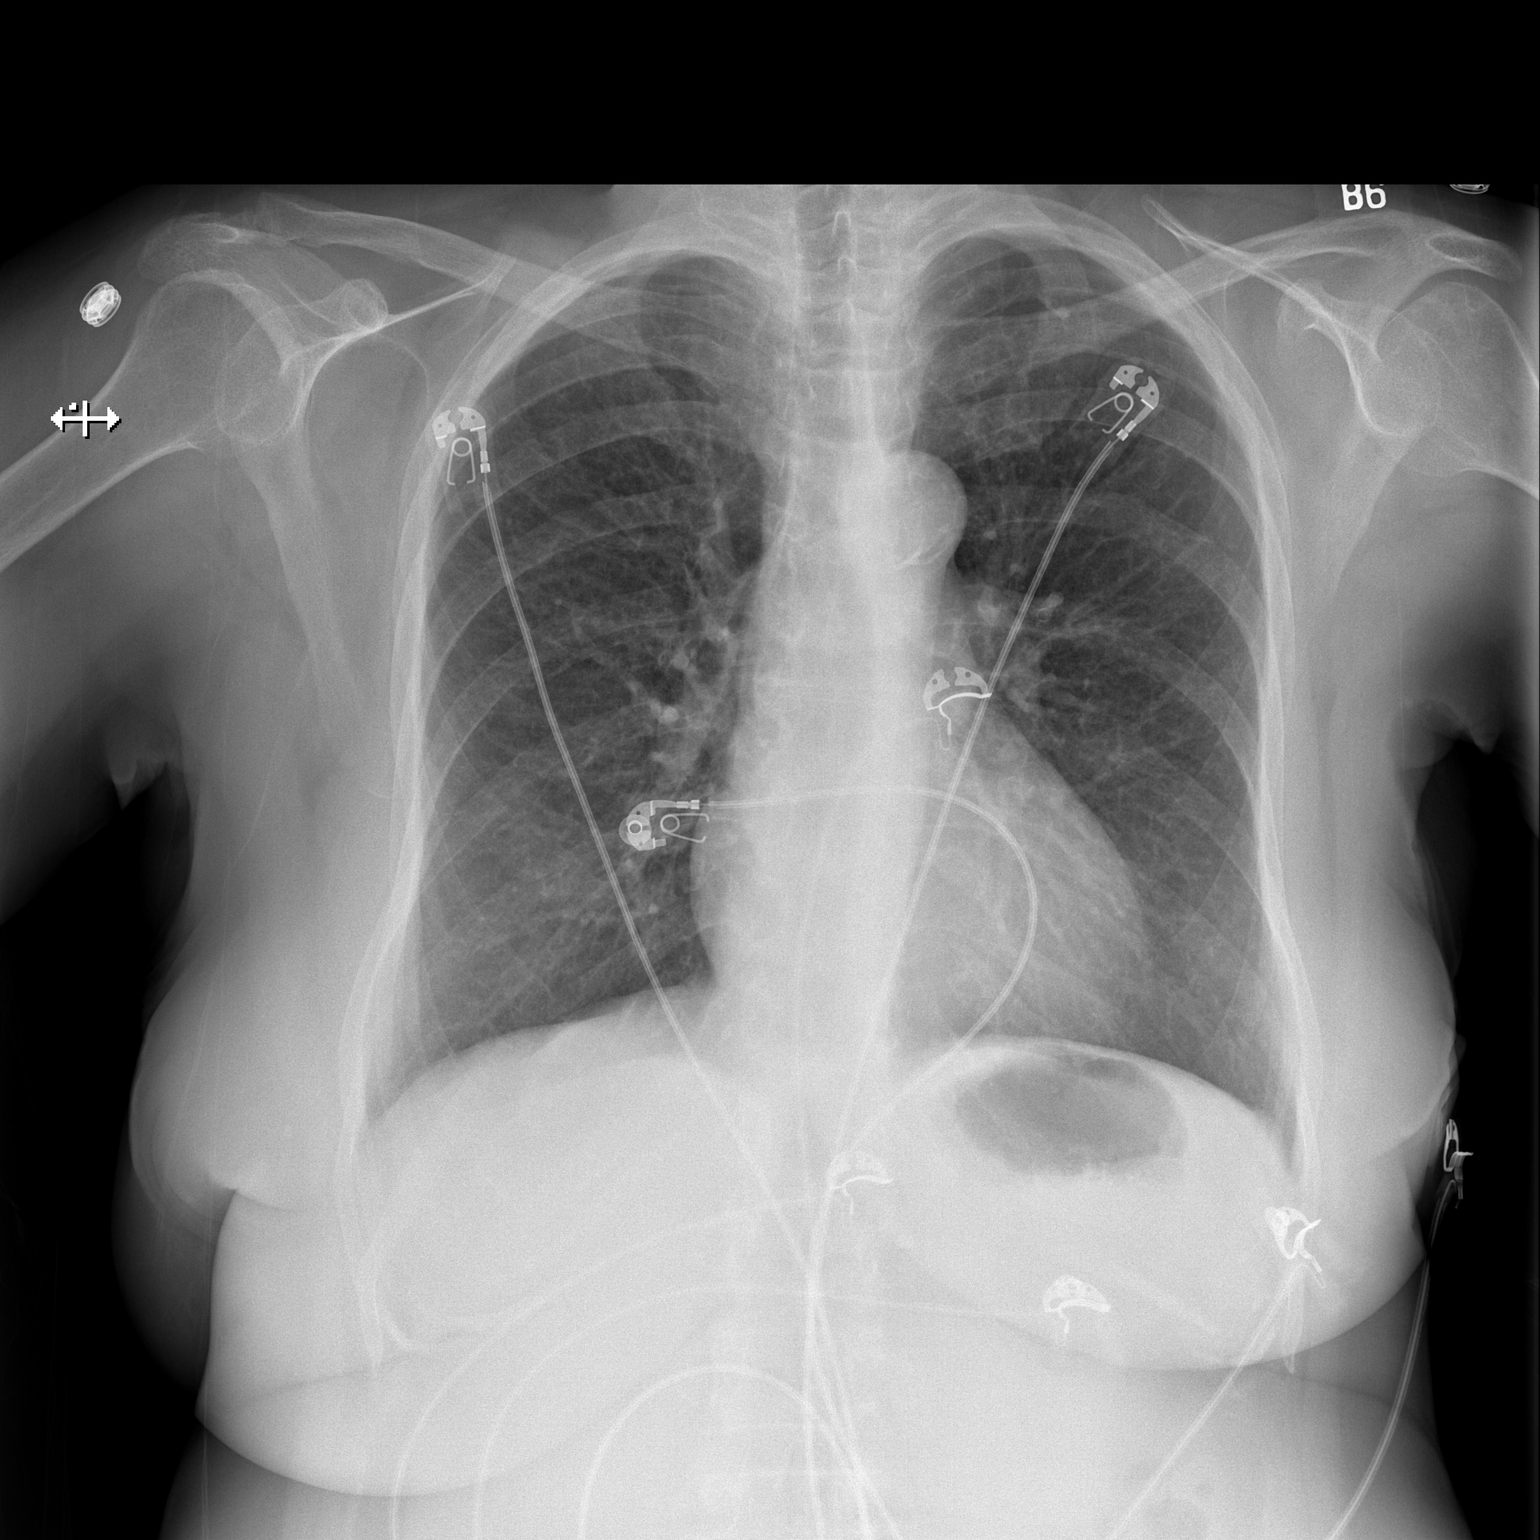

[w chest lat]
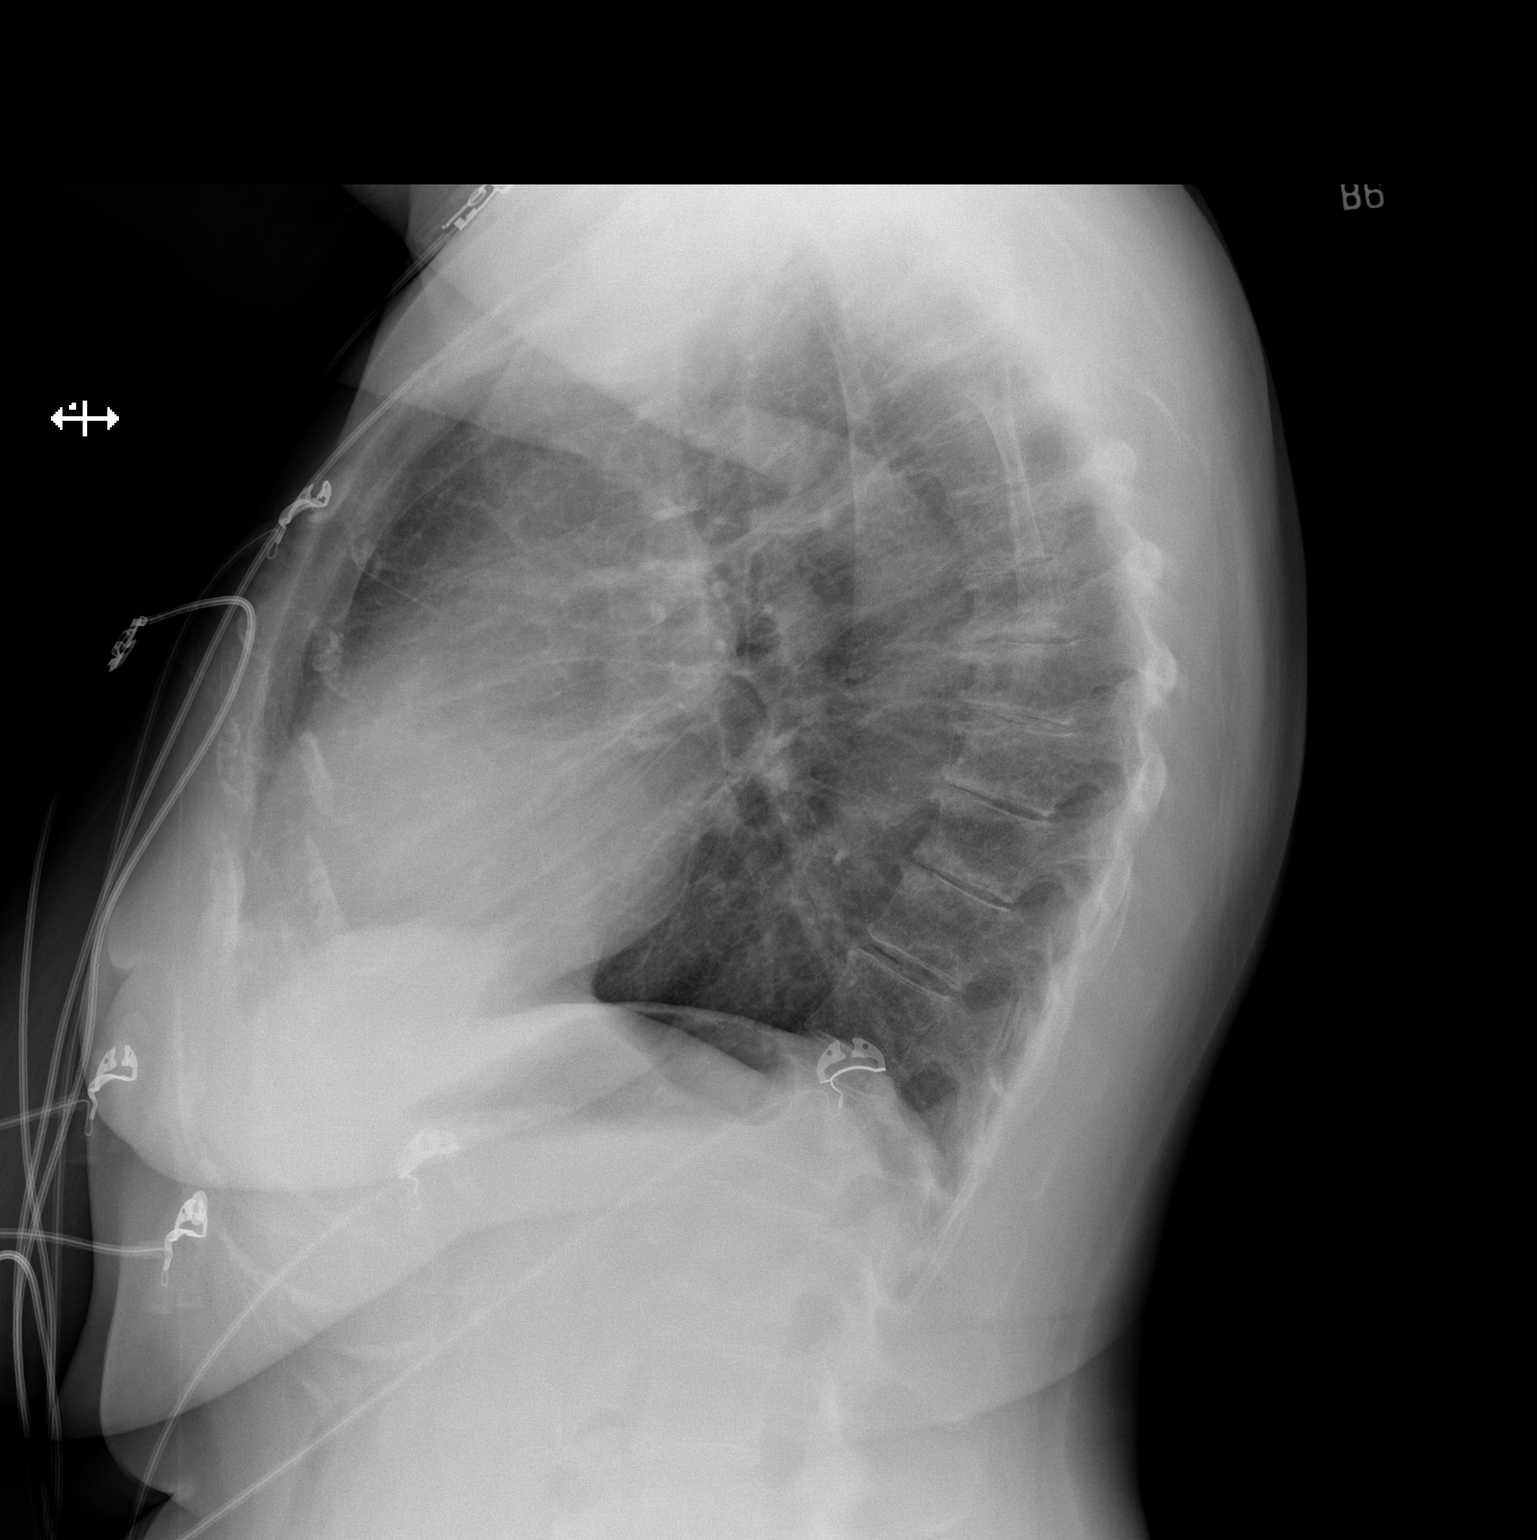

[2 of 2 positions shown; findings below may reference images not displayed]

FINDINGS: There is no edema or consolidation. The heart size and pulmonary
vascularity are normal. No adenopathy. There is aortic
atherosclerosis. There is degenerative change in the thoracic spine.
IMPRESSION: No edema or consolidation. Aortic atherosclerosis. Degenerative
change in the thoracic spine.

Aortic Atherosclerosis ([id number]).

## 2020-04-27 NOTE — Telephone Encounter (Signed)
[employer] approved patient assistance for [employer]

## 2020-05-18 ENCOUNTER — Encounter (INDEPENDENT_AMBULATORY_CARE_PROVIDER_SITE_OTHER): Payer: Medicare Other | Admitting: Ophthalmology

## 2020-07-07 ENCOUNTER — Other Ambulatory Visit: Payer: Self-pay | Admitting: Cardiology

## 2020-07-08 NOTE — Telephone Encounter (Signed)
RN spoke to patient . Patient states she tried to wean the furosemide from last visit. She states she tkase as needed but she has been taking furosemide  Quite often twice a day   RN informed prescription sent into the pharmacy with direction change Patient will keep appointment.

## 2020-08-17 ENCOUNTER — Telehealth: Payer: Self-pay | Admitting: Cardiology

## 2020-08-17 DIAGNOSIS — Z794 Long term (current) use of insulin: Secondary | ICD-10-CM

## 2020-08-17 NOTE — Telephone Encounter (Signed)
Pt c/o swelling: STAT is pt has developed SOB within 24 hours  If swelling, where is the swelling located? Hands and feet   How much weight have you gained and in what time span? Lost   Have you gained 3 pounds in a day or 5 pounds in a week? None   Do you have a log of your daily weights (if so, list)? No   Are you currently taking a fluid pill? Yes but the Dr. Ellyn Hack took her off the night but she still take them  Are you currently SOB? No   Have you traveled recently? No  Patient states she is feeling some tingling hands and feet.

## 2020-08-17 NOTE — Telephone Encounter (Signed)
Returned call to patient-patient states she is having a lot of trouble with her diabetes and controlling it.  Reports ongoing excruciating pain in her hands and feet from her diabetes.   She states Dr. Ellyn Hack referred her to endocrinology but she was unable to go as this was during covid.  She contacted them and they need a new referral.    She states no one is controlling her DM right now. She gets 70/30 at [facility] which she can get without a prescription.   She monitors her CBG at home, states sometimes its high and sometimes its low.  She reports "not eating right".    She does not have a PCP either.  States her PCP passed away.     Advised will place new referral to Endo (referral was from 2020).   Also will mail her a list of PCP offices to contact to get established.

## 2020-09-11 ENCOUNTER — Other Ambulatory Visit: Payer: Self-pay | Admitting: Family Medicine

## 2020-09-11 DIAGNOSIS — G5600 Carpal tunnel syndrome, unspecified upper limb: Secondary | ICD-10-CM

## 2020-09-11 DIAGNOSIS — R279 Unspecified lack of coordination: Secondary | ICD-10-CM

## 2020-09-11 DIAGNOSIS — M5412 Radiculopathy, cervical region: Secondary | ICD-10-CM

## 2020-09-13 ENCOUNTER — Telehealth: Payer: Self-pay | Admitting: Cardiology

## 2020-09-13 NOTE — Telephone Encounter (Signed)
Patient called and wanted to know if Dr. Ellyn Hack or nurse would send in a referral to an orthropedic doctor

## 2020-09-13 NOTE — Telephone Encounter (Signed)
Spoke to patient . Informed patient she will need to contact her primary for a referral  to  orthopedic .  RN informed patient if she need cardiac clearance for procedure the orthopedic office will send a clearance form for  completion.  She verbalized understanding.

## 2020-09-20 ENCOUNTER — Observation Stay (HOSPITAL_COMMUNITY): Payer: Medicare Other

## 2020-09-20 ENCOUNTER — Emergency Department (HOSPITAL_COMMUNITY): Payer: Medicare Other

## 2020-09-20 ENCOUNTER — Inpatient Hospital Stay (HOSPITAL_COMMUNITY)
Admission: EM | Admit: 2020-09-20 | Discharge: 2020-09-23 | DRG: 291 | Disposition: A | Discharge status: Home / Self Care | Payer: Medicare Other | Attending: Internal Medicine | Admitting: Internal Medicine

## 2020-09-20 ENCOUNTER — Other Ambulatory Visit: Payer: Self-pay

## 2020-09-20 ENCOUNTER — Observation Stay (HOSPITAL_BASED_OUTPATIENT_CLINIC_OR_DEPARTMENT_OTHER): Payer: Medicare Other

## 2020-09-20 ENCOUNTER — Encounter (HOSPITAL_COMMUNITY): Payer: Self-pay | Admitting: Emergency Medicine

## 2020-09-20 DIAGNOSIS — I509 Heart failure, unspecified: Secondary | ICD-10-CM

## 2020-09-20 DIAGNOSIS — E785 Hyperlipidemia, unspecified: Secondary | ICD-10-CM | POA: Diagnosis present

## 2020-09-20 DIAGNOSIS — I5043 Acute on chronic combined systolic (congestive) and diastolic (congestive) heart failure: Secondary | ICD-10-CM | POA: Diagnosis not present

## 2020-09-20 DIAGNOSIS — I130 Hypertensive heart and chronic kidney disease with heart failure and stage 1 through stage 4 chronic kidney disease, or unspecified chronic kidney disease: Secondary | ICD-10-CM | POA: Diagnosis not present

## 2020-09-20 DIAGNOSIS — E11649 Type 2 diabetes mellitus with hypoglycemia without coma: Secondary | ICD-10-CM | POA: Diagnosis not present

## 2020-09-20 DIAGNOSIS — E1165 Type 2 diabetes mellitus with hyperglycemia: Secondary | ICD-10-CM | POA: Diagnosis present

## 2020-09-20 DIAGNOSIS — Z823 Family history of stroke: Secondary | ICD-10-CM

## 2020-09-20 DIAGNOSIS — Z9111 Patient's noncompliance with dietary regimen: Secondary | ICD-10-CM

## 2020-09-20 DIAGNOSIS — E875 Hyperkalemia: Secondary | ICD-10-CM | POA: Diagnosis present

## 2020-09-20 DIAGNOSIS — I428 Other cardiomyopathies: Secondary | ICD-10-CM | POA: Diagnosis present

## 2020-09-20 DIAGNOSIS — Z79899 Other long term (current) drug therapy: Secondary | ICD-10-CM

## 2020-09-20 DIAGNOSIS — Z794 Long term (current) use of insulin: Secondary | ICD-10-CM

## 2020-09-20 DIAGNOSIS — Z853 Personal history of malignant neoplasm of breast: Secondary | ICD-10-CM

## 2020-09-20 DIAGNOSIS — R739 Hyperglycemia, unspecified: Secondary | ICD-10-CM

## 2020-09-20 DIAGNOSIS — I5033 Acute on chronic diastolic (congestive) heart failure: Secondary | ICD-10-CM | POA: Diagnosis present

## 2020-09-20 DIAGNOSIS — Z20822 Contact with and (suspected) exposure to covid-19: Secondary | ICD-10-CM | POA: Diagnosis present

## 2020-09-20 DIAGNOSIS — N1832 Chronic kidney disease, stage 3b: Secondary | ICD-10-CM | POA: Diagnosis present

## 2020-09-20 DIAGNOSIS — E1122 Type 2 diabetes mellitus with diabetic chronic kidney disease: Secondary | ICD-10-CM | POA: Diagnosis present

## 2020-09-20 LAB — RESP PANEL BY RT-PCR (FLU A&B, COVID) ARPGX2
Influenza A by PCR: NEGATIVE
Influenza B by PCR: NEGATIVE
SARS Coronavirus 2 by RT PCR: NEGATIVE

## 2020-09-20 LAB — CBC WITH DIFFERENTIAL/PLATELET
Abs Immature Granulocytes: 0.02 10*3/uL (ref 0.00–0.07)
Basophils Absolute: 0.1 10*3/uL (ref 0.0–0.1)
Basophils Relative: 1 %
Eosinophils Absolute: 0.1 10*3/uL (ref 0.0–0.5)
Eosinophils Relative: 2 %
HCT: 30.5 % — ABNORMAL LOW (ref 36.0–46.0)
Hemoglobin: 10.4 g/dL — ABNORMAL LOW (ref 12.0–15.0)
Immature Granulocytes: 0 %
Lymphocytes Relative: 26 %
Lymphs Abs: 1.9 10*3/uL (ref 0.7–4.0)
MCH: 33.2 pg (ref 26.0–34.0)
MCHC: 34.1 g/dL (ref 30.0–36.0)
MCV: 97.4 fL (ref 80.0–100.0)
Monocytes Absolute: 0.8 10*3/uL (ref 0.1–1.0)
Monocytes Relative: 10 %
Neutro Abs: 4.5 10*3/uL (ref 1.7–7.7)
Neutrophils Relative %: 61 %
Platelets: 243 10*3/uL (ref 150–400)
RBC: 3.13 MIL/uL — ABNORMAL LOW (ref 3.87–5.11)
RDW: 13 % (ref 11.5–15.5)
WBC: 7.4 10*3/uL (ref 4.0–10.5)
nRBC: 0 % (ref 0.0–0.2)

## 2020-09-20 LAB — COMPREHENSIVE METABOLIC PANEL
ALT: 28 U/L (ref 0–44)
AST: 26 U/L (ref 15–41)
Albumin: 3.3 g/dL — ABNORMAL LOW (ref 3.5–5.0)
Alkaline Phosphatase: 74 U/L (ref 38–126)
Anion gap: 8 (ref 5–15)
BUN: 27 mg/dL — ABNORMAL HIGH (ref 8–23)
CO2: 23 mmol/L (ref 22–32)
Calcium: 9.4 mg/dL (ref 8.9–10.3)
Chloride: 104 mmol/L (ref 98–111)
Creatinine, Ser: 1.42 mg/dL — ABNORMAL HIGH (ref 0.44–1.00)
GFR, Estimated: 38 mL/min — ABNORMAL LOW (ref >60)
Glucose, Bld: 381 mg/dL — ABNORMAL HIGH (ref 70–99)
Potassium: 5.4 mmol/L — ABNORMAL HIGH (ref 3.5–5.1)
Sodium: 135 mmol/L (ref 135–145)
Total Bilirubin: 0.8 mg/dL (ref 0.3–1.2)
Total Protein: 6.1 g/dL — ABNORMAL LOW (ref 6.5–8.1)

## 2020-09-20 LAB — I-STAT VENOUS BLOOD GAS, ED
Acid-Base Excess: 2 mmol/L (ref 0.0–2.0)
Bicarbonate: 27.1 mmol/L (ref 20.0–28.0)
Calcium, Ion: 1.17 mmol/L (ref 1.15–1.40)
HCT: 30 % — ABNORMAL LOW (ref 36.0–46.0)
Hemoglobin: 10.2 g/dL — ABNORMAL LOW (ref 12.0–15.0)
O2 Saturation: 98 %
Potassium: 5.3 mmol/L — ABNORMAL HIGH (ref 3.5–5.1)
Sodium: 137 mmol/L (ref 135–145)
TCO2: 28 mmol/L (ref 22–32)
pCO2, Ven: 44.8 mmHg (ref 44.0–60.0)
pH, Ven: 7.39 (ref 7.250–7.430)
pO2, Ven: 109 mmHg — ABNORMAL HIGH (ref 32.0–45.0)

## 2020-09-20 LAB — ECHOCARDIOGRAM COMPLETE
AR max vel: 1.39 cm2
AV Area VTI: 1.45 cm2
AV Area mean vel: 1.33 cm2
AV Mean grad: 5 mmHg
AV Peak grad: 8.2 mmHg
Ao pk vel: 1.43 m/s
Area-P 1/2: 4.39 cm2
Calc EF: 40.1 %
Height: 63.5 in
MV M vel: 5.68 m/s
MV Peak grad: 129 mmHg
Radius: 0.6 cm
S' Lateral: 3 cm
Single Plane A2C EF: 43.7 %
Single Plane A4C EF: 38.3 %
Weight: 2304 oz

## 2020-09-20 LAB — HEMOGLOBIN A1C
Hgb A1c MFr Bld: 14 % — ABNORMAL HIGH (ref 4.8–5.6)
Mean Plasma Glucose: 355.1 mg/dL

## 2020-09-20 LAB — URINALYSIS, ROUTINE W REFLEX MICROSCOPIC
Bilirubin Urine: NEGATIVE
Glucose, UA: >=500 mg/dL — AB
Hgb urine dipstick: NEGATIVE
Ketones, ur: NEGATIVE mg/dL
Leukocytes,Ua: NEGATIVE
Nitrite: NEGATIVE
Protein, ur: NEGATIVE mg/dL
Specific Gravity, Urine: 1.009 (ref 1.005–1.030)
pH: 5 (ref 5.0–8.0)

## 2020-09-20 LAB — CBG MONITORING, ED
Glucose-Capillary: 376 mg/dL — ABNORMAL HIGH (ref 70–99)
Glucose-Capillary: 395 mg/dL — ABNORMAL HIGH (ref 70–99)
Glucose-Capillary: 139 mg/dL — ABNORMAL HIGH (ref 70–99)
Glucose-Capillary: 80 mg/dL (ref 70–99)

## 2020-09-20 LAB — PROTIME-INR
INR: 1.1 (ref 0.8–1.2)
Prothrombin Time: 14.3 seconds (ref 11.4–15.2)

## 2020-09-20 LAB — TROPONIN I (HIGH SENSITIVITY)
Troponin I (High Sensitivity): 26 ng/L — ABNORMAL HIGH (ref <18)
Troponin I (High Sensitivity): 31 ng/L — ABNORMAL HIGH (ref <18)

## 2020-09-20 LAB — BRAIN NATRIURETIC PEPTIDE: B Natriuretic Peptide: 1638.7 pg/mL — ABNORMAL HIGH (ref 0.0–100.0)

## 2020-09-20 LAB — MAGNESIUM: Magnesium: 2 mg/dL (ref 1.7–2.4)

## 2020-09-20 MED ORDER — SODIUM ZIRCONIUM CYCLOSILICATE 10 G PO PACK
10.0000 g | PACK | Freq: Once | ORAL | Status: AC
  Administered 2020-09-20: 10 g via ORAL
  Filled 2020-09-20: qty 1

## 2020-09-20 MED ORDER — CARVEDILOL 6.25 MG PO TABS
6.2500 mg | ORAL_TABLET | Freq: Two times a day (BID) | ORAL | Status: DC
  Administered 2020-09-20 – 2020-09-23 (×6): 6.25 mg via ORAL
  Filled 2020-09-20: qty 1
  Filled 2020-09-20: qty 2
  Filled 2020-09-20 (×3): qty 1
  Filled 2020-09-20: qty 2

## 2020-09-20 MED ORDER — ATORVASTATIN CALCIUM 40 MG PO TABS
40.0000 mg | ORAL_TABLET | Freq: Every day | ORAL | Status: DC
  Administered 2020-09-20 – 2020-09-23 (×4): 40 mg via ORAL
  Filled 2020-09-20 (×4): qty 1

## 2020-09-20 MED ORDER — FUROSEMIDE 20 MG PO TABS: 20.0000 mg | ORAL_TABLET | Freq: Every day | ORAL | 1 refills | Status: DC

## 2020-09-20 MED ORDER — SODIUM CHLORIDE 0.9 % IV SOLN: 250.0000 mL | INTRAVENOUS | Status: DC | PRN

## 2020-09-20 MED ORDER — INSULIN ASPART PROT & ASPART (70-30 MIX) 100 UNIT/ML ~~LOC~~ SUSP: 15.0000 [IU] | Freq: Every day | SUBCUTANEOUS | Status: DC

## 2020-09-20 MED ORDER — ONDANSETRON HCL 4 MG/2ML IJ SOLN: 4.0000 mg | Freq: Four times a day (QID) | INTRAMUSCULAR | Status: DC | PRN

## 2020-09-20 MED ORDER — INSULIN ASPART 100 UNIT/ML IJ SOLN
10.0000 [IU] | Freq: Once | INTRAMUSCULAR | Status: AC
  Administered 2020-09-20: 10 [IU] via SUBCUTANEOUS

## 2020-09-20 MED ORDER — ASPIRIN-CAFFEINE 500-32.5 MG PO TABS: 1.0000 | ORAL_TABLET | Freq: Four times a day (QID) | ORAL | Status: DC | PRN

## 2020-09-20 MED ORDER — SODIUM CHLORIDE 0.9% FLUSH: 3.0000 mL | INTRAVENOUS | Status: DC | PRN

## 2020-09-20 MED ORDER — INSULIN ASPART PROT & ASPART (70-30 MIX) 100 UNIT/ML ~~LOC~~ SUSP
30.0000 [IU] | Freq: Every day | SUBCUTANEOUS | Status: DC
  Administered 2020-09-21 – 2020-09-22 (×2): 30 [IU] via SUBCUTANEOUS
  Filled 2020-09-20: qty 10

## 2020-09-20 MED ORDER — SACUBITRIL-VALSARTAN 49-51 MG PO TABS: 1.0000 | ORAL_TABLET | Freq: Two times a day (BID) | ORAL | Status: DC

## 2020-09-20 MED ORDER — ACETAMINOPHEN 325 MG PO TABS: 650.0000 mg | ORAL_TABLET | ORAL | Status: DC | PRN

## 2020-09-20 MED ORDER — ENOXAPARIN SODIUM 30 MG/0.3ML IJ SOSY
30.0000 mg | PREFILLED_SYRINGE | INTRAMUSCULAR | Status: DC
  Administered 2020-09-20 – 2020-09-22 (×3): 30 mg via SUBCUTANEOUS
  Filled 2020-09-20 (×3): qty 0.3

## 2020-09-20 MED ORDER — INSULIN ASPART PROT & ASPART (70-30 MIX) 100 UNIT/ML ~~LOC~~ SUSP
20.0000 [IU] | Freq: Every day | SUBCUTANEOUS | Status: DC
  Administered 2020-09-20 – 2020-09-22 (×3): 20 [IU] via SUBCUTANEOUS
  Filled 2020-09-20: qty 10

## 2020-09-20 MED ORDER — FUROSEMIDE 10 MG/ML IJ SOLN
20.0000 mg | Freq: Two times a day (BID) | INTRAMUSCULAR | Status: DC
  Administered 2020-09-20 – 2020-09-21 (×2): 20 mg via INTRAVENOUS
  Filled 2020-09-20 (×2): qty 2

## 2020-09-20 MED ORDER — LOPERAMIDE HCL 2 MG PO CAPS: 2.0000 mg | ORAL_CAPSULE | Freq: Four times a day (QID) | ORAL | Status: DC | PRN

## 2020-09-20 MED ORDER — INSULIN ASPART 100 UNIT/ML IJ SOLN
0.0000 [IU] | Freq: Three times a day (TID) | INTRAMUSCULAR | Status: DC
  Administered 2020-09-20 – 2020-09-21 (×2): 1 [IU] via SUBCUTANEOUS
  Administered 2020-09-21: 3 [IU] via SUBCUTANEOUS
  Administered 2020-09-21 – 2020-09-22 (×2): 1 [IU] via SUBCUTANEOUS
  Administered 2020-09-23: 3 [IU] via SUBCUTANEOUS

## 2020-09-20 MED ORDER — FUROSEMIDE 10 MG/ML IJ SOLN
40.0000 mg | Freq: Once | INTRAMUSCULAR | Status: AC
  Administered 2020-09-20: 40 mg via INTRAVENOUS
  Filled 2020-09-20: qty 4

## 2020-09-20 MED ORDER — INSULIN ASPART PROT & ASPART (70-30 MIX) 100 UNIT/ML ~~LOC~~ SUSP: 25.0000 [IU] | Freq: Every day | SUBCUTANEOUS | Status: DC

## 2020-09-20 MED ORDER — SODIUM CHLORIDE 0.9% FLUSH
3.0000 mL | Freq: Two times a day (BID) | INTRAVENOUS | Status: DC
  Administered 2020-09-20 – 2020-09-23 (×5): 3 mL via INTRAVENOUS

## 2020-09-20 NOTE — ED Provider Notes (Signed)
[facility] EMERGENCY DEPARTMENT Provider Note   CSN: [id number] Arrival date & time: 09/20/20  0929     History Chief Complaint  Patient presents with   Weakness    [name] is a 75 y.o. female.  HPI Patient reports increased swelling of her feet and legs over the past week.  She is also had a weight gain of estimated 10 pounds.  Patient reports she had been elevating her legs and taking her Lasix as instructed per her physician.  She reports however she continued to have swelling in her legs and became very short of breath last night.  She reports as of last night she was very winded just walking in her home to go to the bathroom.  She reports she has been making urine.  No cough or fever.  Patient reports she was seen as a new patient by Dr. Raliegh Ip at [facility] on 7\8.  At that time her Lasix dose was cut in half.  She has maintained the dose as recommended by her PCP, by EMR review it appears patient had been on 40 mg a day and likely was cut down to 20 mg a day.  They are confirming medications from home bottles.  Patient reports she has not taken her diabetic medications this morning.  Also, the patient was traveling with family and staying in a hotel over the last week.  She reports she did a lot of elevating of her legs but also ate a lot of rich food and food that was saltier than her usual.    Past Medical History:  Diagnosis Date   Breast cancer ([place])    breast - left    Chronic combined systolic and diastolic CHF (congestive heart failure) ([place]) -Systolic Dysfunction resolved.    a. cMRI 5/10: EF 38% // b. Echo 3/13/: EF 50-55, Gr 1 DD // c. Echo 8/14: EF 40, inf/inf-septal HK, Gr 1 DD, mild MR // d. Echo 5/17: EF 40-45, inf HK, Gr 1 DD, mild MR, mild LAE, reduced RVSF, mild TR, PASP 33 // e. Echo 10/17: EF 40-45, Gr 2 DD, mild MR, severe LAE   Coronary artery disease, non-occlusive    a. nonobs by [facility] in 2010 // b. Myoview 10/17: Lg infarct apex, distal  ant and lat walls, no ischemia, EF 38; int risk  // c. [place] 10/17:  dLAD 15, pLCx 79, mRCA 40, LVEDP 15   Diabetes mellitus    NICM (nonischemic cardiomyopathy) [facility])     Patient Active Problem List   Diagnosis Date Noted   Exudative age-related macular degeneration of left eye with inactive choroidal neovascularization ([place]) 07/15/2019   Cystoid macular edema of left eye 05/26/2019   Moderate nonproliferative diabetic retinopathy of both eyes ([place]) 05/26/2019   Early stage nonexudative age-related macular degeneration of both eyes 05/26/2019   FUO (fever of unknown origin)    Hyperglycemia    CKD (chronic kidney disease), stage II 03/17/2016   Hyperlipidemia associated with type 2 diabetes mellitus (HCC)    COPD (chronic obstructive pulmonary disease) ([place]) 06/28/2015   Chronic diastolic heart failure ([place]) 06/28/2015   NICM (nonischemic cardiomyopathy) ([place]) 10/05/2012   Abdominal mass, LLQ (left lower quadrant) 05/03/2012   PULMONARY NODULE 08/05/2008   Palpitations 08/05/2008   Type 2 diabetes mellitus without complication, with long-term current use of insulin ([place]) 07/16/2008   Essential hypertension 07/16/2008    Past Surgical History:  Procedure Laterality Date   APPENDECTOMY  BREAST LUMPECTOMY     lt breast   CARDIAC CATHETERIZATION N/A 12/20/2015   Procedure: Left Heart Cath and Coronary Angiography;  Surgeon: Nelva Bush, MD:: Mild to moderate, nonobstructive coronary artery disease, including 15% distal LAD stenosis, 30% ulcerated mid LCx disease, and 40% mid RCA stenosis.  Upper normal left ventricular filling pressure (LVEDP 15 mmHg).   RIGHT HEART CATH N/A 05/08/2016   Procedure: Right Heart Cath;  Surgeon: Jolaine Artist, MD;  Location: [facility] INVASIVE CV LAB;; RA = 3, RV = 34/5, PA = 38/12 (21), PCW = 12; Fick cardiac output/index = 4.6/2.6; PVR = 1.9 WU, Ao sat = 96%; PA sat = 58%, 62%    TRANSTHORACIC ECHOCARDIOGRAM  10/'17; 3/'18   a) EF 40 and 45%.  GRII  DD; b) 04/2016: EF 20% with grade 2 diastolic dysfunction   TRANSTHORACIC ECHOCARDIOGRAM  10/2016   EF 55-60%.  Normal LV function.  Mild diastolic dysfunction.  Normal valve function   TRIGGER FINGER RELEASE Left 04/23/2017   Procedure: RELEASE TRIGGER FINGER TO LEFT FORTH FINGER;  Surgeon: Cristine Polio, MD;  Location: [place];  Service: Plastics;  Laterality: Left;     OB History   No obstetric history on file.     Family History  Problem Relation Age of Onset   Stroke Father     Social History   Tobacco Use   Smoking status: Former    Years: 30.00    Types: Cigarettes    Quit date: 09/29/2012    Years since quitting: 7.9   Smokeless tobacco: Never  Substance Use Topics   Alcohol use: No   Drug use: No    Home Medications Prior to Admission medications   Medication Sig Start Date End Date Taking? Authorizing Provider  atorvastatin (LIPITOR) 40 MG tablet Take 1 tablet by mouth once daily 07/07/20   Leonie Man, MD  carvedilol (COREG) 6.25 MG tablet Take 1 tablet (6.25 mg total) by mouth 2 (two) times daily. 04/08/20   Leonie Man, MD  furosemide (LASIX) 40 MG tablet May take 40 mg by mouth up to twice a day as needed for swelling 07/08/20   Leonie Man, MD  insulin NPH-regular Human (NOVOLIN 70/30) (70-30) 100 UNIT/ML injection Please start using 25 units in am and 15 units at bedtime. Check CBG's three times a day (including fasting in am) 03/18/16   Barton Dubois, MD  sacubitril-valsartan (ENTRESTO) 49-51 MG Take 1 tablet by mouth 2 (two) times daily. 12/10/17   Leonie Man, MD    Allergies    Tape  Review of Systems   Review of Systems 10 systems reviewed and negative except as per HPI Physical Exam Updated Vital Signs BP (!) 165/76   Pulse 98   Temp 98 F (36.7 C) (Oral)   Resp (!) 22   Ht 5' 3.5" (1.613 m)   Wt 65.3 kg   SpO2 92%   BMI 25.11 kg/m   Physical Exam Constitutional:      Comments: Alert nontoxic clear  mental status no respiratory distress at rest.  Patient is currently wearing 3 L nasal cannula oxygen at 99%  HENT:     Head: Normocephalic and atraumatic.     Mouth/Throat:     Pharynx: Oropharynx is clear.  Cardiovascular:     Rate and Rhythm: Normal rate and regular rhythm.  Pulmonary:     Comments: No respiratory distress at rest.  Crackles bilateral basilar lungs Abdominal:  General: There is no distension.     Palpations: Abdomen is soft.     Tenderness: There is no abdominal tenderness. There is no guarding.  Musculoskeletal:     Comments: 2+ pitting edema bilateral feet.  1+ edema lower leg  Skin:    General: Skin is warm and dry.  Neurological:     General: No focal deficit present.     Mental Status: She is oriented to person, place, and time.     Coordination: Coordination normal.  Psychiatric:        Mood and Affect: Mood normal.    ED Results / Procedures / Treatments   Labs (all labs ordered are listed, but only abnormal results are displayed) Labs Reviewed  CBG MONITORING, ED - Abnormal; Notable for the following components:      Result Value   Glucose-Capillary 376 (*)    All other components within normal limits  RESP PANEL BY RT-PCR (FLU A&B, COVID) ARPGX2  COMPREHENSIVE METABOLIC PANEL  BRAIN NATRIURETIC PEPTIDE  CBC WITH DIFFERENTIAL/PLATELET  PROTIME-INR  URINALYSIS, ROUTINE W REFLEX MICROSCOPIC  BLOOD GAS, VENOUS  TROPONIN I (HIGH SENSITIVITY)    EKG EKG Interpretation  Date/Time:  Monday September 20 2020 09:33:06 EDT Ventricular Rate:  99 PR Interval:  169 QRS Duration: 114 QT Interval:  351 QTC Calculation: 451 R Axis:   -36 Text Interpretation: Sinus tachycardia Multiple ventricular premature complexes Borderline IVCD with LAD Anterior infarct, old Nonspecific T abnormalities, lateral leads rate increase from previous with PVC otherwise similar Confirmed by Charlesetta Shanks [phone]) on 09/20/2020 9:44:59 AM  Radiology DG Chest Port 1  View  Result Date: 09/21/2020 CLINICAL DATA:  CHF. EXAM: PORTABLE CHEST 1 VIEW COMPARISON:  09/20/2020 FINDINGS: Normal heart size and mediastinal contours. No acute infiltrate or edema. No effusion or pneumothorax. No acute osseous findings. IMPRESSION: No active disease. Electronically Signed   By: Monte Fantasia M.D.   On: 09/21/2020 07:19   DG Chest Port 1 View  Result Date: 09/20/2020 CLINICAL DATA:  Shortness of breath, generalized weakness. Peripheral edema in lower extremities. EXAM: PORTABLE CHEST 1 VIEW COMPARISON:  08/20/2017 and CT chest 05/05/2016. FINDINGS: Trachea is midline. Heart size stable. Thoracic aorta is calcified. Mixed interstitial and airspace opacification bilaterally. No pleural fluid. IMPRESSION: Mild pulmonary edema. Electronically Signed   By: Lorin Picket M.D.   On: 09/20/2020 11:22   ECHOCARDIOGRAM COMPLETE  Result Date: 09/20/2020    ECHOCARDIOGRAM REPORT   Patient Name:   [name] Date of Exam: 09/20/2020 Medical Rec #:  [id number]    Height:       63.5 in Accession #:    [id number]   Weight:       144.0 lb Date of Birth:  October 24, 1945    BSA:          1.692 m Patient Age:    34 years     BP:           128/65 mmHg Patient Gender: F            HR:           68 bpm. Exam Location:  Inpatient Procedure: 2D Echo, Cardiac Doppler and Color Doppler Indications:    CHF  History:        Patient has prior history of Echocardiogram examinations, most                 recent 10/24/2016. CHF and Cardiomyopathy, CAD, COPD; Risk  Factors:Diabetes, Hypertension and Dyslipidemia. 05/08/2016 right                 heart cath                 12/20/2015 left heart cath.  Sonographer:    [place] Referring Phys: [id number] [place]  1. Left ventricular ejection fraction, by estimation, is 50 to 55%. The left ventricle has low normal function. The left ventricle has no regional wall motion abnormalities. There is mild left ventricular hypertrophy. Left  ventricular diastolic parameters are consistent with Grade II diastolic dysfunction (pseudonormalization). Elevated left atrial pressure.  2. Right ventricular systolic function is normal. The right ventricular size is normal. There is moderately elevated pulmonary artery systolic pressure. The estimated right ventricular systolic pressure is 123456 mmHg.  3. Left atrial size was mildly dilated.  4. The mitral valve is normal in structure. Mild mitral valve regurgitation.  5. The aortic valve is tricuspid. Aortic valve regurgitation is not visualized. No aortic stenosis is present.  6. The inferior vena cava is dilated in size with >50% respiratory variability, suggesting right atrial pressure of 8 mmHg. FINDINGS  Left Ventricle: Left ventricular ejection fraction, by estimation, is 50 to 55%. The left ventricle has low normal function. The left ventricle has no regional wall motion abnormalities. The left ventricular internal cavity size was normal in size. There is mild left ventricular hypertrophy. Left ventricular diastolic parameters are consistent with Grade II diastolic dysfunction (pseudonormalization). Elevated left atrial pressure. Right Ventricle: The right ventricular size is normal. No increase in right ventricular wall thickness. Right ventricular systolic function is normal. There is moderately elevated pulmonary artery systolic pressure. The tricuspid regurgitant velocity is 3.11 m/s, and with an assumed right atrial pressure of 8 mmHg, the estimated right ventricular systolic pressure is 123456 mmHg. Left Atrium: Left atrial size was mildly dilated. Right Atrium: Right atrial size was normal in size. Pericardium: There is no evidence of pericardial effusion. Mitral Valve: The mitral valve is normal in structure. Mild mitral valve regurgitation. Tricuspid Valve: The tricuspid valve is normal in structure. Tricuspid valve regurgitation is mild. Aortic Valve: The aortic valve is tricuspid. Aortic valve  regurgitation is not visualized. No aortic stenosis is present. Aortic valve mean gradient measures 5.0 mmHg. Aortic valve peak gradient measures 8.2 mmHg. Aortic valve area, by VTI measures 1.45 cm. Pulmonic Valve: The pulmonic valve was not well visualized. Pulmonic valve regurgitation is trivial. Aorta: The aortic root and ascending aorta are structurally normal, with no evidence of dilitation. Venous: The inferior vena cava is dilated in size with greater than 50% respiratory variability, suggesting right atrial pressure of 8 mmHg. IAS/Shunts: The interatrial septum was not well visualized.  LEFT VENTRICLE PLAX 2D LVIDd:         4.10 cm     Diastology LVIDs:         3.00 cm     LV e' medial:    3.81 cm/s LV PW:         1.10 cm     LV E/e' medial:  30.4 LV IVS:        1.10 cm     LV e' lateral:   9.25 cm/s LVOT diam:     1.70 cm     LV E/e' lateral: 12.5 LV SV:         45 LV SV Index:   27 LVOT Area:     2.27 cm  LV  Volumes (MOD) LV vol d, MOD A2C: 67.8 ml LV vol d, MOD A4C: 72.0 ml LV vol s, MOD A2C: 38.2 ml LV vol s, MOD A4C: 44.4 ml LV SV MOD A2C:     29.6 ml LV SV MOD A4C:     72.0 ml LV SV MOD BP:      28.6 ml RIGHT VENTRICLE RV Basal diam:  4.00 cm RV Mid diam:    2.70 cm LEFT ATRIUM             Index       RIGHT ATRIUM           Index LA diam:        4.10 cm 2.42 cm/m  RA Area:     10.40 cm LA Vol (A2C):   55.5 ml 32.81 ml/m RA Volume:   21.90 ml  12.95 ml/m LA Vol (A4C):   69.4 ml 41.03 ml/m LA Biplane Vol: 63.5 ml 37.54 ml/m  AORTIC VALVE                    PULMONIC VALVE AV Area (Vmax):    1.39 cm     PV Vmax:       0.68 m/s AV Area (Vmean):   1.33 cm     PV Vmean:      49.300 cm/s AV Area (VTI):     1.45 cm     PV VTI:        0.175 m AV Vmax:           143.00 cm/s  PV Peak grad:  1.9 mmHg AV Vmean:          100.000 cm/s PV Mean grad:  1.0 mmHg AV VTI:            0.314 m AV Peak Grad:      8.2 mmHg AV Mean Grad:      5.0 mmHg LVOT Vmax:         87.70 cm/s LVOT Vmean:        58.800 cm/s LVOT  VTI:          0.200 m LVOT/AV VTI ratio: 0.64  AORTA Ao Root diam: 2.40 cm Ao Asc diam:  2.80 cm MITRAL VALVE                 TRICUSPID VALVE MV Area (PHT): 4.39 cm      TR Peak grad:   38.7 mmHg MV Decel Time: 173 msec      TR Vmax:        311.00 cm/s MR Peak grad:    129.0 mmHg MR Mean grad:    84.0 mmHg   SHUNTS MR Vmax:         568.00 cm/s Systemic VTI:  0.20 m MR Vmean:        434.0 cm/s  Systemic Diam: 1.70 cm MR PISA:         2.26 cm MR PISA Eff ROA: 10 mm MR PISA Radius:  0.60 cm MV E velocity: 116.00 cm/s MV A velocity: 104.00 cm/s MV E/A ratio:  1.12 Oswaldo Milian MD Electronically signed by Oswaldo Milian MD Signature Date/Time: 09/20/2020/3:10:22 PM    Final    VAS [place] LOWER EXTREMITY VENOUS (DVT)  Result Date: 09/20/2020  Lower Venous DVT Study Patient Name:  [name]  Date of Exam:   09/20/2020 Medical Rec #: [id number]     Accession #:    [id number] Date of Birth:  1945/08/19     Patient Gender: F Patient Age:   72Y Exam Location:  [facility] Procedure:      VAS [place] LOWER EXTREMITY VENOUS (DVT) Referring Phys: [id number] Lequita Halt --------------------------------------------------------------------------------  Indications: Edema.  Comparison Study: no prior Performing Technologist: Archie Patten RVS  Examination Guidelines: A complete evaluation includes B-mode imaging, spectral Doppler, color Doppler, and power Doppler as needed of all accessible portions of each vessel. Bilateral testing is considered an integral part of a complete examination. Limited examinations for reoccurring indications may be performed as noted. The reflux portion of the exam is performed with the patient in reverse Trendelenburg.  +---------+---------------+---------+-----------+----------+--------------+ RIGHT    CompressibilityPhasicitySpontaneityPropertiesThrombus Aging +---------+---------------+---------+-----------+----------+--------------+ CFV      Full           Yes      Yes                                  +---------+---------------+---------+-----------+----------+--------------+ SFJ      Full                                                        +---------+---------------+---------+-----------+----------+--------------+ FV Prox  Full                                                        +---------+---------------+---------+-----------+----------+--------------+ FV Mid   Full                                                        +---------+---------------+---------+-----------+----------+--------------+ FV DistalFull                                                        +---------+---------------+---------+-----------+----------+--------------+ PFV      Full                                                        +---------+---------------+---------+-----------+----------+--------------+ POP      Full           Yes      Yes                                 +---------+---------------+---------+-----------+----------+--------------+ PTV      Full                                                        +---------+---------------+---------+-----------+----------+--------------+  PERO     Full                                                        +---------+---------------+---------+-----------+----------+--------------+   +---------+---------------+---------+-----------+----------+--------------+ LEFT     CompressibilityPhasicitySpontaneityPropertiesThrombus Aging +---------+---------------+---------+-----------+----------+--------------+ CFV      Full           Yes      Yes                                 +---------+---------------+---------+-----------+----------+--------------+ SFJ      Full                                                        +---------+---------------+---------+-----------+----------+--------------+ FV Prox  Full                                                         +---------+---------------+---------+-----------+----------+--------------+ FV Mid   Full                                                        +---------+---------------+---------+-----------+----------+--------------+ FV DistalFull                                                        +---------+---------------+---------+-----------+----------+--------------+ PFV      Full                                                        +---------+---------------+---------+-----------+----------+--------------+ POP      Full           Yes      Yes                                 +---------+---------------+---------+-----------+----------+--------------+ PTV      Full                                                        +---------+---------------+---------+-----------+----------+--------------+ PERO     Full                                                        +---------+---------------+---------+-----------+----------+--------------+  Summary: BILATERAL: - No evidence of deep vein thrombosis seen in the lower extremities, bilaterally. -No evidence of popliteal cyst, bilaterally.   *See table(s) above for measurements and observations.    Preliminary     Procedures Procedures   Medications Ordered in ED Medications - No data to display  ED Course  I have reviewed the triage vital signs and the nursing notes.  Pertinent labs & imaging results that were available during my care of the patient were reviewed by me and considered in my medical decision making (see chart for details).    MDM Rules/Calculators/A&P                           Shortness of breath with even mild exertion.  She does have crackles on exam and some vascular congestion on chest x-ray.  Patient has peripheral edema.  Findings consistent with CHF.  She was hypoxic to 90% on EMS arrival.  Diuresis initiated in the emergency department.  Plan for admission for acute on chronic CHF. Final  Clinical Impression(s) / ED Diagnoses Final diagnoses:  Acute on chronic congestive heart failure, unspecified heart failure type [facility])    Rx / DC Orders ED Discharge Orders     None        Charlesetta Shanks, MD 09/21/20 1058

## 2020-09-20 NOTE — H&P (Signed)
History and Physical    [name] [id number] DOB: [birth year] DOA: 09/20/2020  PCP: Stephani Police, MD (Confirm with patient/family/NH records and if not entered, this has to be entered at [facility] point of entry) Patient coming from: Home  I have personally briefly reviewed patient's old medical records in [place]  Chief Complaint: SOB and leg swelling  HPI: [name] is a 75 y.o. female with medical history significant of chronic combined systolic and diastolic CHF, HTN, CKD stage III, IIDM, HLD, presented with increasing shortness of breath and leg swelling.  Patient has a baseline CKD stage III, for which she has been following with her PCP.  She is also on chronic Lasix 40 mg daily, recently 3 weeks ago, routine blood work showed that patient had worsening of her CKD, and her PCP cut down her Lasix from 40 mg daily to 20 mg daily.  For last 2 weeks patient did not feel much difference no shortness of breath or leg swelling.  Last week, patient went down for a trip for about 7 days, unable to control salt and calorie intake, came back yesterday started to feel shortness of breath and worsening of leg swelling.  No cough, no fever or chills.  She also has had poor glucose control, used to be on sliding scale and changed to 70/30 recently.  Patient also complaining about recently developed needles and tingling sensation on both feet, and decreased sensation.  She is scheduled to follow-up with endocrinology to discuss about it in September.  ED Course: Patient was found to have fluid overload, lungs congested on x-ray, significant peripheral edema.  Potassium 5.4, creatinine 1.4 compared to 1.61-year ago, glucose 381.  Review of Systems: As per HPI otherwise 14 point review of systems negative.    Past Medical History:  Diagnosis Date   Breast cancer ([place])    breast - left    Chronic combined systolic and diastolic CHF (congestive heart failure) (HCC) -Systolic Dysfunction  resolved.    a. cMRI 5/10: EF 38% // b. Echo 3/13/: EF 50-55, Gr 1 DD // c. Echo 8/14: EF 40, inf/inf-septal HK, Gr 1 DD, mild MR // d. Echo 5/17: EF 40-45, inf HK, Gr 1 DD, mild MR, mild LAE, reduced RVSF, mild TR, PASP 33 // e. Echo 10/17: EF 40-45, Gr 2 DD, mild MR, severe LAE   Coronary artery disease, non-occlusive    a. nonobs by LHC in 2010 // b. Myoview 10/17: Lg infarct apex, distal ant and lat walls, no ischemia, EF 38; int risk  // c. [place] 10/17:  dLAD 15, pLCx 36, mRCA 40, LVEDP 15   Diabetes mellitus    NICM (nonischemic cardiomyopathy) ([place])     Past Surgical History:  Procedure Laterality Date   APPENDECTOMY     BREAST LUMPECTOMY     lt breast   CARDIAC CATHETERIZATION N/A 12/20/2015   Procedure: Left Heart Cath and Coronary Angiography;  Surgeon: Nelva Bush, MD:: Mild to moderate, nonobstructive coronary artery disease, including 15% distal LAD stenosis, 30% ulcerated mid LCx disease, and 40% mid RCA stenosis.  Upper normal left ventricular filling pressure (LVEDP 15 mmHg).   RIGHT HEART CATH N/A 05/08/2016   Procedure: Right Heart Cath;  Surgeon: Jolaine Artist, MD;  Location: [place] [facility];; RA = 3, RV = 34/5, PA = 38/12 (21), PCW = 12; Fick cardiac output/index = 4.6/2.6; PVR = 1.9 WU, Ao sat = 96%; PA sat = 58%, 62%  TRANSTHORACIC ECHOCARDIOGRAM  10/'17; 3/'18   a) EF 40 and 45%.  GRII DD; b) 04/2016: EF 20% with grade 2 diastolic dysfunction   TRANSTHORACIC ECHOCARDIOGRAM  10/2016   EF 55-60%.  Normal LV function.  Mild diastolic dysfunction.  Normal valve function   TRIGGER FINGER RELEASE Left 04/23/2017   Procedure: RELEASE TRIGGER FINGER TO LEFT FORTH FINGER;  Surgeon: Cristine Polio, MD;  Location: [place];  Service: Plastics;  Laterality: Left;     reports that she quit smoking about 7 years ago. She has never used smokeless tobacco. She reports that she does not drink alcohol and does not use drugs.  Allergies  Allergen Reactions    Tape Rash    Pt can tolerate the paper tape    Family History  Problem Relation Age of Onset   Stroke Father      Prior to Admission medications   Medication Sig Start Date End Date Taking? Authorizing Provider  Aspirin-Caffeine (BACK & BODY EXTRA STRENGTH) 500-32.5 MG TABS Take 1 tablet by mouth every 6 (six) hours as needed (pain).   Yes [provider]  atorvastatin (LIPITOR) 40 MG tablet Take 1 tablet by mouth once daily Patient taking differently: Take 40 mg by mouth daily. 07/07/20  Yes Leonie Man, MD  carvedilol (COREG) 6.25 MG tablet Take 1 tablet (6.25 mg total) by mouth 2 (two) times daily. 04/08/20  Yes Leonie Man, MD  insulin NPH-regular Human (NOVOLIN 70/30) (70-30) 100 UNIT/ML injection Please start using 25 units in am and 15 units at bedtime. Check CBG's three times a day (including fasting in am) Patient taking differently: Inject 15 Units into the skin 2 (two) times daily with a meal. 03/18/16  Yes Barton Dubois, MD  loperamide (IMODIUM A-D) 2 MG tablet Take 2 mg by mouth 4 (four) times daily as needed for diarrhea or loose stools.   Yes [provider]  sacubitril-valsartan (ENTRESTO) 49-51 MG Take 1 tablet by mouth 2 (two) times daily. 12/10/17  Yes Leonie Man, MD  furosemide (LASIX) 20 MG tablet Take 1 tablet (20 mg total) by mouth daily. 09/20/20   Lequita Halt, MD    Physical Exam: Vitals:   09/20/20 1145 09/20/20 1200 09/20/20 1215 09/20/20 1230  BP: 131/62 119/61 131/66 128/65  Pulse:    74  Resp: 18 10 (!) 24 18  Temp:      TempSrc:      SpO2:    95%  Weight:      Height:        Constitutional: NAD, calm, comfortable Vitals:   09/20/20 1145 09/20/20 1200 09/20/20 1215 09/20/20 1230  BP: 131/62 119/61 131/66 128/65  Pulse:    74  Resp: 18 10 (!) 24 18  Temp:      TempSrc:      SpO2:    95%  Weight:      Height:       Eyes: PERRL, lids and conjunctivae normal ENMT: Mucous membranes are moist. Posterior  pharynx clear of any exudate or lesions.Normal dentition.  Neck: normal, supple, no masses, no thyromegaly Respiratory: clear to auscultation bilaterally, no wheezing, fine crackles to bilateral mid levels.  Increasing respiratory effort. No accessory muscle use.  Cardiovascular: Regular rate and rhythm, no murmurs / rubs / gallops.  2+ extremity edema. 2+ pedal pulses. No carotid bruits.  Abdomen: no tenderness, no masses palpated. No hepatosplenomegaly. Bowel sounds positive.  Musculoskeletal: no clubbing / cyanosis. No joint  deformity upper and lower extremities. Good ROM, no contractures. Normal muscle tone.  Skin: no rashes, lesions, ulcers. No induration Neurologic: CN 2-12 grossly intact. Sensation intact, DTR normal. Strength 5/5 in all 4.  Psychiatric: Normal judgment and insight. Alert and oriented x 3. Normal mood.     Labs on Admission: I have personally reviewed following labs and imaging studies  CBC: Recent Labs  Lab 09/20/20 0946 09/20/20 1149  WBC 7.4  --   NEUTROABS 4.5  --   HGB 10.4* 10.2*  HCT 30.5* 30.0*  MCV 97.4  --   PLT 243  --    Basic Metabolic Panel: Recent Labs  Lab 09/20/20 0946 09/20/20 1149  NA 135 137  K 5.4* 5.3*  CL 104  --   CO2 23  --   GLUCOSE 381*  --   BUN 27*  --   CREATININE 1.42*  --   CALCIUM 9.4  --    GFR: Estimated Creatinine Clearance: 31 mL/min (A) (by C-G formula based on SCr of 1.42 mg/dL (H)). Liver Function Tests: Recent Labs  Lab 09/20/20 0946  AST 26  ALT 28  ALKPHOS 74  BILITOT 0.8  PROT 6.1*  ALBUMIN 3.3*   No results for input(s): LIPASE, AMYLASE in the last 168 hours. No results for input(s): AMMONIA in the last 168 hours. Coagulation Profile: Recent Labs  Lab 09/20/20 0946  INR 1.1   Cardiac Enzymes: No results for input(s): CKTOTAL, CKMB, CKMBINDEX, TROPONINI in the last 168 hours. BNP (last 3 results) No results for input(s): PROBNP in the last 8760 hours. HbA1C: No results for input(s):  HGBA1C in the last 72 hours. CBG: Recent Labs  Lab 09/20/20 0935  GLUCAP 376*   Lipid Profile: No results for input(s): CHOL, HDL, LDLCALC, TRIG, CHOLHDL, LDLDIRECT in the last 72 hours. Thyroid Function Tests: No results for input(s): TSH, T4TOTAL, FREET4, T3FREE, THYROIDAB in the last 72 hours. Anemia Panel: No results for input(s): VITAMINB12, FOLATE, FERRITIN, TIBC, IRON, RETICCTPCT in the last 72 hours. Urine analysis:    Component Value Date/Time   COLORURINE YELLOW 05/09/2016 0511   APPEARANCEUR CLEAR 05/09/2016 0511   LABSPEC 1.013 05/09/2016 0511   PHURINE 5.0 05/09/2016 0511   GLUCOSEU NEGATIVE 05/09/2016 0511   HGBUR SMALL (A) 05/09/2016 0511   BILIRUBINUR NEGATIVE 05/09/2016 0511   KETONESUR 5 (A) 05/09/2016 0511   PROTEINUR NEGATIVE 05/09/2016 0511   UROBILINOGEN 0.2 04/28/2012 1757   NITRITE NEGATIVE 05/09/2016 0511   LEUKOCYTESUR NEGATIVE 05/09/2016 0511    Radiological Exams on Admission: DG Chest Port 1 View  Result Date: 09/20/2020 CLINICAL DATA:  Shortness of breath, generalized weakness. Peripheral edema in lower extremities. EXAM: PORTABLE CHEST 1 VIEW COMPARISON:  08/20/2017 and CT chest 05/05/2016. FINDINGS: Trachea is midline. Heart size stable. Thoracic aorta is calcified. Mixed interstitial and airspace opacification bilaterally. No pleural fluid. IMPRESSION: Mild pulmonary edema. Electronically Signed   By: Lorin Picket M.D.   On: 09/20/2020 11:22    EKG: Independently reviewed.  Sinus, no acute ST changes, frequent PVCs.  Assessment/Plan Active Problems:   Acute CHF (congestive heart failure) (HCC)   CHF (congestive heart failure) ([place])  (please populate well all problems here in Problem List. (For example, if patient is on BP meds at home and you resume or decide to hold them, it is a problem that needs to be her. Same for CAD, COPD, HLD and so on)   Acute on chronic combined systolic and diastolic CHF decompensation -Secondary to  noncompliant with salt and fluid intake. -Received Lasix 40 mg IV once in the ED, continue Lasix IV 20 mg twice daily tonight and tomorrow morning, expect fluid overload improved and kidney function remained stable likely can go home tomorrow to resume Lasix 20 mg daily, and as needed Lasix 20 p.o. 20 mg for swelling or weight gain 2 pounds in 1 day or 5 pounds in 3 days.  Explained to patient and her family who expressed understanding and agreed. -Repeat x-ray in the morning. -DVT study  Borderline hyperkalemia -Hold Entresto for today -Lasix, 1 dose of Lokelma given.  Repeat potassium in the morning.  IDDM with hyperglycemia -Increase 70/30 to 30 units before breakfast and 20 units before dinner -Sliding scale, check A1c -One dose of 10 unit insulin IV x1. -Outpatient follow-up with endocrine to discuss about starting SGLT2  CKD stage III -Creatinine level stable, increase Lasix for fluid overload. -Hold Entresto for hyperkalemia.  Question of diabetic neuropathy -Discussed with patient regarding starting Lyrica, patient wanted to talk to endocrinology.  DVT prophylaxis: Lovenox Code Status: Full code Family Communication: Daughter at bedside Disposition Plan: Expect less than 2 midnight hospital stay Consults called: None Admission status: Tele obs   Lequita Halt MD [facility] Pager [phone]  09/20/2020, 1:01 PM

## 2020-09-20 NOTE — ED Notes (Addendum)
Pt alert, NAD, calm, interactive, resps e/u, speaking in clear complete sentences, denies pain, sob, nausea. VAS [place] finished at [facility]. Pure wick in place, VSS.

## 2020-09-20 NOTE — Progress Notes (Signed)
Lower extremity venous has been completed.   Preliminary results in CV Proc.   Abram Sander 09/20/2020 4:17 PM

## 2020-09-20 NOTE — Progress Notes (Signed)
*  PRELIMINARY RESULTS* Echocardiogram 2D Echocardiogram has been performed.  Luisa Hart RDCS 09/20/2020, 1:48 PM

## 2020-09-20 NOTE — ED Triage Notes (Signed)
Pt BIB [facility] c/o generalized weakness, exertional SOB, peripheral edema to feet/ankles. Pt gained 10 lb in a week after dose reduced for diuretic ~1wk ago.. Denies CP/N/V.   EMS VS- BP 150/80, HR 90, RR 16, initial O2 sat low 90s, placed on 2L--> 98%. CBG 426 Hx T2DM.

## 2020-09-21 ENCOUNTER — Encounter (HOSPITAL_COMMUNITY): Payer: Self-pay | Admitting: Internal Medicine

## 2020-09-21 ENCOUNTER — Observation Stay (HOSPITAL_COMMUNITY): Payer: Medicare Other

## 2020-09-21 DIAGNOSIS — Z794 Long term (current) use of insulin: Secondary | ICD-10-CM | POA: Diagnosis not present

## 2020-09-21 DIAGNOSIS — I509 Heart failure, unspecified: Secondary | ICD-10-CM | POA: Diagnosis present

## 2020-09-21 DIAGNOSIS — Z823 Family history of stroke: Secondary | ICD-10-CM | POA: Diagnosis not present

## 2020-09-21 DIAGNOSIS — E1122 Type 2 diabetes mellitus with diabetic chronic kidney disease: Secondary | ICD-10-CM | POA: Diagnosis present

## 2020-09-21 DIAGNOSIS — I5033 Acute on chronic diastolic (congestive) heart failure: Secondary | ICD-10-CM | POA: Diagnosis present

## 2020-09-21 DIAGNOSIS — E1165 Type 2 diabetes mellitus with hyperglycemia: Secondary | ICD-10-CM | POA: Diagnosis present

## 2020-09-21 DIAGNOSIS — I130 Hypertensive heart and chronic kidney disease with heart failure and stage 1 through stage 4 chronic kidney disease, or unspecified chronic kidney disease: Secondary | ICD-10-CM | POA: Diagnosis present

## 2020-09-21 DIAGNOSIS — Z853 Personal history of malignant neoplasm of breast: Secondary | ICD-10-CM | POA: Diagnosis not present

## 2020-09-21 DIAGNOSIS — E11649 Type 2 diabetes mellitus with hypoglycemia without coma: Secondary | ICD-10-CM | POA: Diagnosis not present

## 2020-09-21 DIAGNOSIS — Z20822 Contact with and (suspected) exposure to covid-19: Secondary | ICD-10-CM | POA: Diagnosis present

## 2020-09-21 DIAGNOSIS — I428 Other cardiomyopathies: Secondary | ICD-10-CM | POA: Diagnosis present

## 2020-09-21 DIAGNOSIS — E785 Hyperlipidemia, unspecified: Secondary | ICD-10-CM | POA: Diagnosis present

## 2020-09-21 DIAGNOSIS — N1832 Chronic kidney disease, stage 3b: Secondary | ICD-10-CM | POA: Diagnosis present

## 2020-09-21 DIAGNOSIS — Z9111 Patient's noncompliance with dietary regimen: Secondary | ICD-10-CM | POA: Diagnosis not present

## 2020-09-21 DIAGNOSIS — E875 Hyperkalemia: Secondary | ICD-10-CM | POA: Diagnosis present

## 2020-09-21 DIAGNOSIS — Z79899 Other long term (current) drug therapy: Secondary | ICD-10-CM | POA: Diagnosis not present

## 2020-09-21 LAB — CBG MONITORING, ED
Glucose-Capillary: 139 mg/dL — ABNORMAL HIGH (ref 70–99)
Glucose-Capillary: 157 mg/dL — ABNORMAL HIGH (ref 70–99)
Glucose-Capillary: 247 mg/dL — ABNORMAL HIGH (ref 70–99)

## 2020-09-21 LAB — GLUCOSE, CAPILLARY
Glucose-Capillary: 137 mg/dL — ABNORMAL HIGH (ref 70–99)
Glucose-Capillary: 84 mg/dL (ref 70–99)

## 2020-09-21 LAB — BASIC METABOLIC PANEL
Anion gap: 12 (ref 5–15)
BUN: 29 mg/dL — ABNORMAL HIGH (ref 8–23)
CO2: 24 mmol/L (ref 22–32)
Calcium: 9.1 mg/dL (ref 8.9–10.3)
Chloride: 101 mmol/L (ref 98–111)
Creatinine, Ser: 1.35 mg/dL — ABNORMAL HIGH (ref 0.44–1.00)
GFR, Estimated: 41 mL/min — ABNORMAL LOW (ref >60)
Glucose, Bld: 204 mg/dL — ABNORMAL HIGH (ref 70–99)
Potassium: 4.1 mmol/L (ref 3.5–5.1)
Sodium: 137 mmol/L (ref 135–145)

## 2020-09-21 MED ORDER — SACUBITRIL-VALSARTAN 49-51 MG PO TABS
1.0000 | ORAL_TABLET | Freq: Two times a day (BID) | ORAL | Status: DC
  Administered 2020-09-21 – 2020-09-23 (×4): 1 via ORAL
  Filled 2020-09-21 (×4): qty 1

## 2020-09-21 MED ORDER — FUROSEMIDE 10 MG/ML IJ SOLN
40.0000 mg | Freq: Two times a day (BID) | INTRAMUSCULAR | Status: DC
  Administered 2020-09-21 – 2020-09-23 (×4): 40 mg via INTRAVENOUS
  Filled 2020-09-21 (×4): qty 4

## 2020-09-21 NOTE — Progress Notes (Signed)
Inpatient Diabetes Program Recommendations  AACE/ADA: New Consensus Statement on Inpatient Glycemic Control (2015)  Target Ranges:  Prepandial:   less than 140 mg/dL      Peak postprandial:   less than 180 mg/dL (1-2 hours)      Critically ill patients:  140 - 180 mg/dL   Lab Results  Component Value Date   GLUCAP 247 (H) 09/21/2020   HGBA1C 14.0 (H) 09/20/2020    Review of Glycemic Control  Diabetes history: DM2 Outpatient Diabetes medications: Novolin Relion 70/30 insulin 15 units bid Current orders for Inpatient glycemic control: 70/30 30 units am, 20 units pm  Inpatient Diabetes Program Recommendations:   Spoke with patient via phone. Discussed A1c 14.0 and patient was aware of A1c and has appointment with [facility] Endocrinology September 20th. Patient had not seen endocrinologist in the past but aware her blood sugars were running high. Patient recently lost her younger sister who was her only sibling and shared she has not gotten over losing her husband years ago along with her mom. Patient is thankful to get help and focus on her health.  Will follow up with patient after admitted.  Thank you, Nani Gasser. Mayce Noyes, RN, MSN, CDE  Diabetes Coordinator Inpatient Glycemic Control Team Team Pager [phone] (8am-5pm) 09/21/2020 11:41 AM

## 2020-09-21 NOTE — Plan of Care (Signed)

## 2020-09-21 NOTE — Progress Notes (Signed)
PROGRESS NOTE    [name]  [id number] DOB: Dec 25, 1945 DOA: 09/20/2020 PCP: Stephani Police, MD    Brief Narrative:  75 y.o. female with medical history significant of chronic combined systolic and diastolic CHF, HTN, CKD stage III, IIDM, HLD, presented with increasing shortness of breath and leg swelling. Pt admits to dietary noncompliance prior to admit. Reports wt increase from 125lbs to 145lbs in 3-5 days. No improvement despite taking extra po lasix prior to admit. Pt was admitted for acute chf exacerbation  Assessment & Plan:   Active Problems:   Acute CHF (congestive heart failure) (HCC)   CHF (congestive heart failure) (HCC)  Acute on chronic combined systolic and diastolic CHF decompensation -Likely secondary to poor adherence to diet -Some improvement following IV lasix given in ED -This AM, remains grossly volume overloaded with BLE edema -Will continue IV lasix '40mg'$  BID  -follow daily wts and strict I/o's  -LE dopplers neg for DVT, reviewed -recheck bmet in AM   Borderline hyperkalemia - Delene Loll was briefly held secondary to mild hyperkalemia, will resume -repeat bmet in AM.   IDDM with hyperglycemia -Increase 70/30 to 30 units before breakfast and 20 units before dinner -A1c of 14, suggesting very poor glycemic control -Outpatient follow-up with endocrine to discuss about starting SGLT2   CKD stage III, chronic -Creatinine level stable, and baseline -resumed home entresto   Question of diabetic neuropathy -Pt to follow up with primary endocrinologist    DVT prophylaxis: Lovenox subq Code Status: Full Family Communication: Pt in room, family not at bedside  Status is: Observation  The patient will require care spanning > 2 midnights and should be moved to inpatient because: IV treatments appropriate due to intensity of illness or inability to take PO and Inpatient level of care appropriate due to severity of illness  Dispo: The patient is from:  Home              Anticipated d/c is to: Home              Patient currently is not medically stable to d/c.   Difficult to place patient No       Consultants:    Procedures:    Antimicrobials: Anti-infectives (From admission, onward)    None       Subjective: Reports feeling better. Still has feet swelling  Objective: Vitals:   09/21/20 1400 09/21/20 1430 09/21/20 1500 09/21/20 1642  BP: 117/64 (!) 118/55 130/66   Pulse: 76 75 75   Resp: (!) '21 17 14   '$ Temp:      TempSrc:      SpO2: 98% 97% 98%   Weight:    63.5 kg  Height:    '5\' 4"'$  (1.626 m)    Intake/Output Summary (Last 24 hours) at 09/21/2020 1644 Last data filed at 09/21/2020 1203 Gross per 24 hour  Intake --  Output 700 ml  Net -700 ml   Filed Weights   09/20/20 0934 09/21/20 1642  Weight: 65.3 kg 63.5 kg    Examination: General exam: Awake, laying in bed, in nad Respiratory system: Normal respiratory effort, no wheezing Cardiovascular system: regular rate, s1, s2 Gastrointestinal system: Soft, nondistended, positive BS Central nervous system: CN2-12 grossly intact, strength intact Extremities: Perfused, no clubbing, BLE edema Skin: Normal skin turgor, no notable skin lesions seen Psychiatry: Mood normal // no visual hallucinations   Data Reviewed: I have personally reviewed following labs and imaging studies  CBC: Recent Labs  Lab 09/20/20  [id number] 09/20/20 1149  WBC 7.4  --   NEUTROABS 4.5  --   HGB 10.4* 10.2*  HCT 30.5* 30.0*  MCV 97.4  --   PLT 243  --    Basic Metabolic Panel: Recent Labs  Lab 09/20/20 0946 09/20/20 1149 09/20/20 1438 09/21/20 0515  NA 135 137  --  137  K 5.4* 5.3*  --  4.1  CL 104  --   --  101  CO2 23  --   --  24  GLUCOSE 381*  --   --  204*  BUN 27*  --   --  29*  CREATININE 1.42*  --   --  1.35*  CALCIUM 9.4  --   --  9.1  MG  --   --  2.0  --    GFR: Estimated Creatinine Clearance: 30.6 mL/min (A) (by C-G formula based on SCr of 1.35 mg/dL  (H)). Liver Function Tests: Recent Labs  Lab 09/20/20 0946  AST 26  ALT 28  ALKPHOS 74  BILITOT 0.8  PROT 6.1*  ALBUMIN 3.3*   No results for input(s): LIPASE, AMYLASE in the last 168 hours. No results for input(s): AMMONIA in the last 168 hours. Coagulation Profile: Recent Labs  Lab 09/20/20 0946  INR 1.1   Cardiac Enzymes: No results for input(s): CKTOTAL, CKMB, CKMBINDEX, TROPONINI in the last 168 hours. BNP (last 3 results) No results for input(s): PROBNP in the last 8760 hours. HbA1C: Recent Labs    09/20/20 1438  HGBA1C 14.0*   CBG: Recent Labs  Lab 09/20/20 1804 09/20/20 2057 09/21/20 0018 09/21/20 0750 09/21/20 1208  GLUCAP 139* 80 157* 247* 139*   Lipid Profile: No results for input(s): CHOL, HDL, LDLCALC, TRIG, CHOLHDL, LDLDIRECT in the last 72 hours. Thyroid Function Tests: No results for input(s): TSH, T4TOTAL, FREET4, T3FREE, THYROIDAB in the last 72 hours. Anemia Panel: No results for input(s): VITAMINB12, FOLATE, FERRITIN, TIBC, IRON, RETICCTPCT in the last 72 hours. Sepsis Labs: No results for input(s): PROCALCITON, LATICACIDVEN in the last 168 hours.  Recent Results (from the past 240 hour(s))  Resp Panel by RT-PCR (Flu A&B, Covid) Urine, Clean Catch     Status: None   Collection Time: 09/20/20 12:26 PM   Specimen: Urine, Clean Catch; Nasopharyngeal(NP) swabs in vial transport medium  Result Value Ref Range Status   SARS Coronavirus 2 by RT PCR NEGATIVE NEGATIVE Final    Comment: (NOTE) SARS-CoV-2 target nucleic acids are NOT DETECTED.  The SARS-CoV-2 RNA is generally detectable in upper respiratory specimens during the acute phase of infection. The lowest concentration of SARS-CoV-2 viral copies this assay can detect is 138 copies/mL. A negative result does not preclude SARS-Cov-2 infection and should not be used as the sole basis for treatment or other patient management decisions. A negative result may occur with  improper specimen  collection/handling, submission of specimen other than nasopharyngeal swab, presence of viral mutation(s) within the areas targeted by this assay, and inadequate number of viral copies(<138 copies/mL). A negative result must be combined with clinical observations, patient history, and epidemiological information. The expected result is Negative.  Fact Sheet for Patients:  EntrepreneurPulse.com.au  Fact Sheet for Healthcare Providers:  IncredibleEmployment.be  This test is no t yet approved or cleared by the [place] FDA and  has been authorized for detection and/or diagnosis of SARS-CoV-2 by FDA under an Emergency Use Authorization (EUA). This EUA will remain  in effect (meaning this test can be used) for the  duration of the COVID-19 declaration under Section 564(b)(1) of the Act, 21 U.S.C.section 360bbb-3(b)(1), unless the authorization is terminated  or revoked sooner.       Influenza A by PCR NEGATIVE NEGATIVE Final   Influenza B by PCR NEGATIVE NEGATIVE Final    Comment: (NOTE) The Xpert Xpress SARS-CoV-2/FLU/RSV plus assay is intended as an aid in the diagnosis of influenza from Nasopharyngeal swab specimens and should not be used as a sole basis for treatment. Nasal washings and aspirates are unacceptable for Xpert Xpress SARS-CoV-2/FLU/RSV testing.  Fact Sheet for Patients: EntrepreneurPulse.com.au  Fact Sheet for Healthcare Providers: IncredibleEmployment.be  This test is not yet approved or cleared by the [place] FDA and has been authorized for detection and/or diagnosis of SARS-CoV-2 by FDA under an Emergency Use Authorization (EUA). This EUA will remain in effect (meaning this test can be used) for the duration of the COVID-19 declaration under Section 564(b)(1) of the Act, 21 U.S.C. section 360bbb-3(b)(1), unless the authorization is terminated or revoked.  Performed at [facility], [place] [address] [place], [place] [address]      Radiology Studies: DG Chest Port 1 View  Result Date: 09/21/2020 CLINICAL DATA:  CHF. EXAM: PORTABLE CHEST 1 VIEW COMPARISON:  09/20/2020 FINDINGS: Normal heart size and mediastinal contours. No acute infiltrate or edema. No effusion or pneumothorax. No acute osseous findings. IMPRESSION: No active disease. Electronically Signed   By: Monte Fantasia M.D.   On: 09/21/2020 07:19   DG Chest Port 1 View  Result Date: 09/20/2020 CLINICAL DATA:  Shortness of breath, generalized weakness. Peripheral edema in lower extremities. EXAM: PORTABLE CHEST 1 VIEW COMPARISON:  08/20/2017 and CT chest 05/05/2016. FINDINGS: Trachea is midline. Heart size stable. Thoracic aorta is calcified. Mixed interstitial and airspace opacification bilaterally. No pleural fluid. IMPRESSION: Mild pulmonary edema. Electronically Signed   By: Lorin Picket M.D.   On: 09/20/2020 11:22   ECHOCARDIOGRAM COMPLETE  Result Date: 09/20/2020    ECHOCARDIOGRAM REPORT   Patient Name:   NYEESHA BENDICK Date of Exam: 09/20/2020 Medical Rec #:  [id number]    Height:       63.5 in Accession #:    [id number]   Weight:       144.0 lb Date of Birth:  1945-08-06    BSA:          1.692 m Patient Age:    46 years     BP:           128/65 mmHg Patient Gender: F            HR:           68 bpm. Exam Location:  Inpatient Procedure: 2D Echo, Cardiac Doppler and Color Doppler Indications:    CHF  History:        Patient has prior history of Echocardiogram examinations, most                 recent 10/24/2016. CHF and Cardiomyopathy, CAD, COPD; Risk                 Factors:Diabetes, Hypertension and Dyslipidemia. 05/08/2016 right                 heart cath                 12/20/2015 left heart cath.  Sonographer:    [place] Referring Phys: [id number] [place]  1. Left ventricular ejection fraction, by estimation,  is 50 to 55%. The left ventricle has low normal function. The left  ventricle has no regional wall motion abnormalities. There is mild left ventricular hypertrophy. Left ventricular diastolic parameters are consistent with Grade II diastolic dysfunction (pseudonormalization). Elevated left atrial pressure.  2. Right ventricular systolic function is normal. The right ventricular size is normal. There is moderately elevated pulmonary artery systolic pressure. The estimated right ventricular systolic pressure is 123456 mmHg.  3. Left atrial size was mildly dilated.  4. The mitral valve is normal in structure. Mild mitral valve regurgitation.  5. The aortic valve is tricuspid. Aortic valve regurgitation is not visualized. No aortic stenosis is present.  6. The inferior vena cava is dilated in size with >50% respiratory variability, suggesting right atrial pressure of 8 mmHg. FINDINGS  Left Ventricle: Left ventricular ejection fraction, by estimation, is 50 to 55%. The left ventricle has low normal function. The left ventricle has no regional wall motion abnormalities. The left ventricular internal cavity size was normal in size. There is mild left ventricular hypertrophy. Left ventricular diastolic parameters are consistent with Grade II diastolic dysfunction (pseudonormalization). Elevated left atrial pressure. Right Ventricle: The right ventricular size is normal. No increase in right ventricular wall thickness. Right ventricular systolic function is normal. There is moderately elevated pulmonary artery systolic pressure. The tricuspid regurgitant velocity is 3.11 m/s, and with an assumed right atrial pressure of 8 mmHg, the estimated right ventricular systolic pressure is 123456 mmHg. Left Atrium: Left atrial size was mildly dilated. Right Atrium: Right atrial size was normal in size. Pericardium: There is no evidence of pericardial effusion. Mitral Valve: The mitral valve is normal in structure. Mild mitral valve regurgitation. Tricuspid Valve: The tricuspid valve is normal in  structure. Tricuspid valve regurgitation is mild. Aortic Valve: The aortic valve is tricuspid. Aortic valve regurgitation is not visualized. No aortic stenosis is present. Aortic valve mean gradient measures 5.0 mmHg. Aortic valve peak gradient measures 8.2 mmHg. Aortic valve area, by VTI measures 1.45 cm. Pulmonic Valve: The pulmonic valve was not well visualized. Pulmonic valve regurgitation is trivial. Aorta: The aortic root and ascending aorta are structurally normal, with no evidence of dilitation. Venous: The inferior vena cava is dilated in size with greater than 50% respiratory variability, suggesting right atrial pressure of 8 mmHg. IAS/Shunts: The interatrial septum was not well visualized.  LEFT VENTRICLE PLAX 2D LVIDd:         4.10 cm     Diastology LVIDs:         3.00 cm     LV e' medial:    3.81 cm/s LV PW:         1.10 cm     LV E/e' medial:  30.4 LV IVS:        1.10 cm     LV e' lateral:   9.25 cm/s LVOT diam:     1.70 cm     LV E/e' lateral: 12.5 LV SV:         45 LV SV Index:   27 LVOT Area:     2.27 cm  LV Volumes (MOD) LV vol d, MOD A2C: 67.8 ml LV vol d, MOD A4C: 72.0 ml LV vol s, MOD A2C: 38.2 ml LV vol s, MOD A4C: 44.4 ml LV SV MOD A2C:     29.6 ml LV SV MOD A4C:     72.0 ml LV SV MOD BP:      28.6 ml RIGHT VENTRICLE RV Basal diam:  4.00 cm RV Mid diam:    2.70 cm LEFT ATRIUM             Index       RIGHT ATRIUM           Index LA diam:        4.10 cm 2.42 cm/m  RA Area:     10.40 cm LA Vol (A2C):   55.5 ml 32.81 ml/m RA Volume:   21.90 ml  12.95 ml/m LA Vol (A4C):   69.4 ml 41.03 ml/m LA Biplane Vol: 63.5 ml 37.54 ml/m  AORTIC VALVE                    PULMONIC VALVE AV Area (Vmax):    1.39 cm     PV Vmax:       0.68 m/s AV Area (Vmean):   1.33 cm     PV Vmean:      49.300 cm/s AV Area (VTI):     1.45 cm     PV VTI:        0.175 m AV Vmax:           143.00 cm/s  PV Peak grad:  1.9 mmHg AV Vmean:          100.000 cm/s PV Mean grad:  1.0 mmHg AV VTI:            0.314 m AV Peak Grad:       8.2 mmHg AV Mean Grad:      5.0 mmHg LVOT Vmax:         87.70 cm/s LVOT Vmean:        58.800 cm/s LVOT VTI:          0.200 m LVOT/AV VTI ratio: 0.64  AORTA Ao Root diam: 2.40 cm Ao Asc diam:  2.80 cm MITRAL VALVE                 TRICUSPID VALVE MV Area (PHT): 4.39 cm      TR Peak grad:   38.7 mmHg MV Decel Time: 173 msec      TR Vmax:        311.00 cm/s MR Peak grad:    129.0 mmHg MR Mean grad:    84.0 mmHg   SHUNTS MR Vmax:         568.00 cm/s Systemic VTI:  0.20 m MR Vmean:        434.0 cm/s  Systemic Diam: 1.70 cm MR PISA:         2.26 cm MR PISA Eff ROA: 10 mm MR PISA Radius:  0.60 cm MV E velocity: 116.00 cm/s MV A velocity: 104.00 cm/s MV E/A ratio:  1.12 Oswaldo Milian MD Electronically signed by Oswaldo Milian MD Signature Date/Time: 09/20/2020/3:10:22 PM    Final    VAS [place] LOWER EXTREMITY VENOUS (DVT)  Result Date: 09/20/2020  Lower Venous DVT Study Patient Name:  [name]  Date of Exam:   09/20/2020 Medical Rec #: [id number]     Accession #:    [id number] Date of Birth: November 19, 1945     Patient Gender: F Patient Age:   74Y Exam Location:  [facility] Procedure:      VAS [place] LOWER EXTREMITY VENOUS (DVT) Referring Phys: [id number] Lequita Halt --------------------------------------------------------------------------------  Indications: Edema.  Comparison Study: no prior Performing Technologist: Archie Patten RVS  Examination Guidelines: A complete evaluation includes B-mode imaging, spectral Doppler, color Doppler, and power Doppler  as needed of all accessible portions of each vessel. Bilateral testing is considered an integral part of a complete examination. Limited examinations for reoccurring indications may be performed as noted. The reflux portion of the exam is performed with the patient in reverse Trendelenburg.  +---------+---------------+---------+-----------+----------+--------------+ RIGHT    CompressibilityPhasicitySpontaneityPropertiesThrombus Aging  +---------+---------------+---------+-----------+----------+--------------+ CFV      Full           Yes      Yes                                 +---------+---------------+---------+-----------+----------+--------------+ SFJ      Full                                                        +---------+---------------+---------+-----------+----------+--------------+ FV Prox  Full                                                        +---------+---------------+---------+-----------+----------+--------------+ FV Mid   Full                                                        +---------+---------------+---------+-----------+----------+--------------+ FV DistalFull                                                        +---------+---------------+---------+-----------+----------+--------------+ PFV      Full                                                        +---------+---------------+---------+-----------+----------+--------------+ POP      Full           Yes      Yes                                 +---------+---------------+---------+-----------+----------+--------------+ PTV      Full                                                        +---------+---------------+---------+-----------+----------+--------------+ PERO     Full                                                        +---------+---------------+---------+-----------+----------+--------------+   +---------+---------------+---------+-----------+----------+--------------+  LEFT     CompressibilityPhasicitySpontaneityPropertiesThrombus Aging +---------+---------------+---------+-----------+----------+--------------+ CFV      Full           Yes      Yes                                 +---------+---------------+---------+-----------+----------+--------------+ SFJ      Full                                                         +---------+---------------+---------+-----------+----------+--------------+ FV Prox  Full                                                        +---------+---------------+---------+-----------+----------+--------------+ FV Mid   Full                                                        +---------+---------------+---------+-----------+----------+--------------+ FV DistalFull                                                        +---------+---------------+---------+-----------+----------+--------------+ PFV      Full                                                        +---------+---------------+---------+-----------+----------+--------------+ POP      Full           Yes      Yes                                 +---------+---------------+---------+-----------+----------+--------------+ PTV      Full                                                        +---------+---------------+---------+-----------+----------+--------------+ PERO     Full                                                        +---------+---------------+---------+-----------+----------+--------------+     Summary: BILATERAL: - No evidence of deep vein thrombosis seen in the lower extremities, bilaterally. -No evidence of popliteal cyst, bilaterally.   *See table(s) above for measurements and observations.    Preliminary     Scheduled Meds:  atorvastatin  40 mg Oral Daily   carvedilol  6.25 mg Oral BID   enoxaparin (LOVENOX) injection  30 mg Subcutaneous Q24H   furosemide  40 mg Intravenous BID   insulin aspart  0-9 Units Subcutaneous TID WC   insulin aspart protamine- aspart  20 Units Subcutaneous Q supper   insulin aspart protamine- aspart  30 Units Subcutaneous Q breakfast   sodium chloride flush  3 mL Intravenous Q12H   Continuous Infusions:  sodium chloride       LOS: 0 days   Marylu Lund, MD [facility] Pager On Amion  If 7PM-7AM, please contact  night-coverage 09/21/2020, 4:44 PM

## 2020-09-22 ENCOUNTER — Other Ambulatory Visit (HOSPITAL_COMMUNITY): Payer: Self-pay

## 2020-09-22 ENCOUNTER — Encounter (HOSPITAL_COMMUNITY): Payer: Self-pay | Admitting: Internal Medicine

## 2020-09-22 DIAGNOSIS — I5033 Acute on chronic diastolic (congestive) heart failure: Secondary | ICD-10-CM

## 2020-09-22 LAB — BASIC METABOLIC PANEL
Anion gap: 9 (ref 5–15)
BUN: 28 mg/dL — ABNORMAL HIGH (ref 8–23)
CO2: 28 mmol/L (ref 22–32)
Calcium: 8.7 mg/dL — ABNORMAL LOW (ref 8.9–10.3)
Chloride: 100 mmol/L (ref 98–111)
Creatinine, Ser: 1.27 mg/dL — ABNORMAL HIGH (ref 0.44–1.00)
GFR, Estimated: 44 mL/min — ABNORMAL LOW (ref >60)
Glucose, Bld: 71 mg/dL (ref 70–99)
Potassium: 3.6 mmol/L (ref 3.5–5.1)
Sodium: 137 mmol/L (ref 135–145)

## 2020-09-22 LAB — GLUCOSE, CAPILLARY
Glucose-Capillary: 97 mg/dL (ref 70–99)
Glucose-Capillary: 170 mg/dL — ABNORMAL HIGH (ref 70–99)
Glucose-Capillary: 80 mg/dL (ref 70–99)
Glucose-Capillary: 144 mg/dL — ABNORMAL HIGH (ref 70–99)
Glucose-Capillary: 38 mg/dL — CL (ref 70–99)
Glucose-Capillary: 76 mg/dL (ref 70–99)
Glucose-Capillary: 91 mg/dL (ref 70–99)

## 2020-09-22 MED ORDER — GLUCOSE 40 % PO GEL: 2.0000 | ORAL | Status: AC

## 2020-09-22 MED ORDER — GLUCOSE 40 % PO GEL
ORAL | Status: AC
  Administered 2020-09-22: 1 via ORAL
  Filled 2020-09-22: qty 1

## 2020-09-22 NOTE — Progress Notes (Addendum)
Progress Note    [name]  [id number] DOB: [birth year]  DOA: 09/20/2020 PCP: Stephani Police, MD      Brief Narrative:    Medical records reviewed and are as summarized below:  DARBEY Cardenas is a 75 y.o. female with medical history significant of chronic combined systolic and diastolic CHF, HTN, CKD stage III, IIDM, HLD, who presented with increasing shortness of breath and leg swelling.  She admitted to dietary indiscretion and medical nonadherence.      Assessment/Plan:   Active Problems:   Acute CHF (congestive heart failure) (HCC)   CHF (congestive heart failure) (HCC)   CHF exacerbation (HCC)    Body mass index is 23.52 kg/m.   Acute on chronic diastolic CHF: Continue IV Lasix.  Monitor BMP, daily weight and urine output.  Venous duplex of lower extremities negative for DVT.  2D echo showed EF estimated at 50 to [id number], grade 2 diastolic dysfunction, mild LVH.  The importance of medical adherence was emphasized.  Mild hyperkalemia: Resolved.  IDDM with hyperglycemia: Hemoglobin A1c was 14.  Continue insulin 70/30 30 units in the morning and 20 units before dinner.  Outpatient follow-up with endocrinologist recommended for further management.  CKD stage IIIa: Creatinine is stable.    Diet Order             Diet renal/carb modified with fluid restriction Diet-HS Snack? Nothing; Fluid restriction: 1200 mL Fluid; Room service appropriate? Yes; Fluid consistency: Thin  Diet effective now                      Consultants: None  Procedures: None    Medications:    atorvastatin  40 mg Oral Daily   carvedilol  6.25 mg Oral BID   enoxaparin (LOVENOX) injection  30 mg Subcutaneous Q24H   furosemide  40 mg Intravenous BID   insulin aspart  0-9 Units Subcutaneous TID WC   insulin aspart protamine- aspart  20 Units Subcutaneous Q supper   insulin aspart protamine- aspart  30 Units Subcutaneous Q breakfast   sacubitril-valsartan  1 tablet Oral  BID   sodium chloride flush  3 mL Intravenous Q12H   Continuous Infusions:  sodium chloride       Anti-infectives (From admission, onward)    None              Family Communication/Anticipated D/C date and plan/Code Status   DVT prophylaxis: enoxaparin (LOVENOX) injection 30 mg Start: 09/20/20 1600     Code Status: Full Code  Family Communication: None Disposition Plan:    Status is: Inpatient  Remains inpatient appropriate because:IV treatments appropriate due to intensity of illness or inability to take PO and Inpatient level of care appropriate due to severity of illness  Dispo: The patient is from: Home              Anticipated d/c is to: Home              Patient currently is not medically stable to d/c.   Difficult to place patient No           Subjective:   C/o fatigue and swelling in bilateral legs and feet breathing is better.  Objective:    Vitals:   09/21/20 1929 09/22/20 0017 09/22/20 0453 09/22/20 0722  BP: 131/68 132/82 (!) 134/55 134/74  Pulse: 79 71 77 79  Resp: '18 18 18 18  '$ Temp: 98.9 F (37.2 C) 99.5 F (37.5  C) 99.5 F (37.5 C) 98.5 F (36.9 C)  TempSrc: Oral Oral Oral Oral  SpO2: 99% 99% 96% 95%  Weight:   62.1 kg   Height:       No data found.   Intake/Output Summary (Last 24 hours) at 09/22/2020 1034 Last data filed at 09/22/2020 1003 Gross per 24 hour  Intake 480 ml  Output 2450 ml  Net -1970 ml   Filed Weights   09/20/20 0934 09/21/20 1642 09/22/20 0453  Weight: 65.3 kg 63.5 kg 62.1 kg    Exam:  GEN: NAD SKIN: No rash EYES: EOMI ENT: MMM CV: RRR PULM: CTA B ABD: soft, ND, NT, +BS CNS: AAO x 3, non focal EXT: Bilateral distal leg edema and bilateral pedal edema, no tenderness        Data Reviewed:   I have personally reviewed following labs and imaging studies:  Labs: Labs show the following:   Basic Metabolic Panel: Recent Labs  Lab 09/20/20 0946 09/20/20 1149 09/20/20 1438  09/21/20 0515 09/22/20 0230  NA 135 137  --  137 137  K 5.4* 5.3*  --  4.1 3.6  CL 104  --   --  101 100  CO2 23  --   --  24 28  GLUCOSE 381*  --   --  204* 71  BUN 27*  --   --  29* 28*  CREATININE 1.42*  --   --  1.35* 1.27*  CALCIUM 9.4  --   --  9.1 8.7*  MG  --   --  2.0  --   --    GFR Estimated Creatinine Clearance: 32.5 mL/min (A) (by C-G formula based on SCr of 1.27 mg/dL (H)). Liver Function Tests: Recent Labs  Lab 09/20/20 0946  AST 26  ALT 28  ALKPHOS 74  BILITOT 0.8  PROT 6.1*  ALBUMIN 3.3*   No results for input(s): LIPASE, AMYLASE in the last 168 hours. No results for input(s): AMMONIA in the last 168 hours. Coagulation profile Recent Labs  Lab 09/20/20 0946  INR 1.1    CBC: Recent Labs  Lab 09/20/20 0946 09/20/20 1149  WBC 7.4  --   NEUTROABS 4.5  --   HGB 10.4* 10.2*  HCT 30.5* 30.0*  MCV 97.4  --   PLT 243  --    Cardiac Enzymes: No results for input(s): CKTOTAL, CKMB, CKMBINDEX, TROPONINI in the last 168 hours. BNP (last 3 results) No results for input(s): PROBNP in the last 8760 hours. CBG: Recent Labs  Lab 09/21/20 1208 09/21/20 1648 09/21/20 2118 09/22/20 0607 09/22/20 0934  GLUCAP 139* 137* 84 97 170*   D-Dimer: No results for input(s): DDIMER in the last 72 hours. Hgb A1c: Recent Labs    09/20/20 1438  HGBA1C 14.0*   Lipid Profile: No results for input(s): CHOL, HDL, LDLCALC, TRIG, CHOLHDL, LDLDIRECT in the last 72 hours. Thyroid function studies: No results for input(s): TSH, T4TOTAL, T3FREE, THYROIDAB in the last 72 hours.  Invalid input(s): FREET3 Anemia work up: No results for input(s): VITAMINB12, FOLATE, FERRITIN, TIBC, IRON, RETICCTPCT in the last 72 hours. Sepsis Labs: Recent Labs  Lab 09/20/20 0946  WBC 7.4    Microbiology Recent Results (from the past 240 hour(s))  Resp Panel by RT-PCR (Flu A&B, Covid) Urine, Clean Catch     Status: None   Collection Time: 09/20/20 12:26 PM   Specimen: Urine,  Clean Catch; Nasopharyngeal(NP) swabs in vial transport medium  Result Value Ref Range  Status   SARS Coronavirus 2 by RT PCR NEGATIVE NEGATIVE Final    Comment: (NOTE) SARS-CoV-2 target nucleic acids are NOT DETECTED.  The SARS-CoV-2 RNA is generally detectable in upper respiratory specimens during the acute phase of infection. The lowest concentration of SARS-CoV-2 viral copies this assay can detect is 138 copies/mL. A negative result does not preclude SARS-Cov-2 infection and should not be used as the sole basis for treatment or other patient management decisions. A negative result may occur with  improper specimen collection/handling, submission of specimen other than nasopharyngeal swab, presence of viral mutation(s) within the areas targeted by this assay, and inadequate number of viral copies(<138 copies/mL). A negative result must be combined with clinical observations, patient history, and epidemiological information. The expected result is Negative.  Fact Sheet for Patients:  EntrepreneurPulse.com.au  Fact Sheet for Healthcare Providers:  IncredibleEmployment.be  This test is no t yet approved or cleared by the [place] FDA and  has been authorized for detection and/or diagnosis of SARS-CoV-2 by FDA under an Emergency Use Authorization (EUA). This EUA will remain  in effect (meaning this test can be used) for the duration of the COVID-19 declaration under Section 564(b)(1) of the Act, 21 U.S.C.section 360bbb-3(b)(1), unless the authorization is terminated  or revoked sooner.       Influenza A by PCR NEGATIVE NEGATIVE Final   Influenza B by PCR NEGATIVE NEGATIVE Final    Comment: (NOTE) The Xpert Xpress SARS-CoV-2/FLU/RSV plus assay is intended as an aid in the diagnosis of influenza from Nasopharyngeal swab specimens and should not be used as a sole basis for treatment. Nasal washings and aspirates are unacceptable for Xpert  Xpress SARS-CoV-2/FLU/RSV testing.  Fact Sheet for Patients: EntrepreneurPulse.com.au  Fact Sheet for Healthcare Providers: IncredibleEmployment.be  This test is not yet approved or cleared by the [place] FDA and has been authorized for detection and/or diagnosis of SARS-CoV-2 by FDA under an Emergency Use Authorization (EUA). This EUA will remain in effect (meaning this test can be used) for the duration of the COVID-19 declaration under Section 564(b)(1) of the Act, 21 U.S.C. section 360bbb-3(b)(1), unless the authorization is terminated or revoked.  Performed at [facility], [place] [address] [place], [place] [address]     Procedures and diagnostic studies:  DG Chest Port 1 View  Result Date: 09/21/2020 CLINICAL DATA:  CHF. EXAM: PORTABLE CHEST 1 VIEW COMPARISON:  09/20/2020 FINDINGS: Normal heart size and mediastinal contours. No acute infiltrate or edema. No effusion or pneumothorax. No acute osseous findings. IMPRESSION: No active disease. Electronically Signed   By: Monte Fantasia M.D.   On: 09/21/2020 07:19   DG Chest Port 1 View  Result Date: 09/20/2020 CLINICAL DATA:  Shortness of breath, generalized weakness. Peripheral edema in lower extremities. EXAM: PORTABLE CHEST 1 VIEW COMPARISON:  08/20/2017 and CT chest 05/05/2016. FINDINGS: Trachea is midline. Heart size stable. Thoracic aorta is calcified. Mixed interstitial and airspace opacification bilaterally. No pleural fluid. IMPRESSION: Mild pulmonary edema. Electronically Signed   By: Lorin Picket M.D.   On: 09/20/2020 11:22   ECHOCARDIOGRAM COMPLETE  Result Date: 09/20/2020    ECHOCARDIOGRAM REPORT   Patient Name:   [name] Date of Exam: 09/20/2020 Medical Rec #:  [id number]    Height:       63.5 in Accession #:    [id number]   Weight:       144.0 lb Date of Birth:  11/01/1945    BSA:  1.692 m Patient Age:    16 years     BP:           128/65 mmHg Patient Gender:  F            HR:           68 bpm. Exam Location:  Inpatient Procedure: 2D Echo, Cardiac Doppler and Color Doppler Indications:    CHF  History:        Patient has prior history of Echocardiogram examinations, most                 recent 10/24/2016. CHF and Cardiomyopathy, CAD, COPD; Risk                 Factors:Diabetes, Hypertension and Dyslipidemia. 05/08/2016 right                 heart cath                 12/20/2015 left heart cath.  Sonographer:    [place] Referring Phys: [id number] [place]  1. Left ventricular ejection fraction, by estimation, is 50 to 55%. The left ventricle has low normal function. The left ventricle has no regional wall motion abnormalities. There is mild left ventricular hypertrophy. Left ventricular diastolic parameters are consistent with Grade II diastolic dysfunction (pseudonormalization). Elevated left atrial pressure.  2. Right ventricular systolic function is normal. The right ventricular size is normal. There is moderately elevated pulmonary artery systolic pressure. The estimated right ventricular systolic pressure is 123456 mmHg.  3. Left atrial size was mildly dilated.  4. The mitral valve is normal in structure. Mild mitral valve regurgitation.  5. The aortic valve is tricuspid. Aortic valve regurgitation is not visualized. No aortic stenosis is present.  6. The inferior vena cava is dilated in size with >50% respiratory variability, suggesting right atrial pressure of 8 mmHg. FINDINGS  Left Ventricle: Left ventricular ejection fraction, by estimation, is 50 to 55%. The left ventricle has low normal function. The left ventricle has no regional wall motion abnormalities. The left ventricular internal cavity size was normal in size. There is mild left ventricular hypertrophy. Left ventricular diastolic parameters are consistent with Grade II diastolic dysfunction (pseudonormalization). Elevated left atrial pressure. Right Ventricle: The right ventricular size  is normal. No increase in right ventricular wall thickness. Right ventricular systolic function is normal. There is moderately elevated pulmonary artery systolic pressure. The tricuspid regurgitant velocity is 3.11 m/s, and with an assumed right atrial pressure of 8 mmHg, the estimated right ventricular systolic pressure is 123456 mmHg. Left Atrium: Left atrial size was mildly dilated. Right Atrium: Right atrial size was normal in size. Pericardium: There is no evidence of pericardial effusion. Mitral Valve: The mitral valve is normal in structure. Mild mitral valve regurgitation. Tricuspid Valve: The tricuspid valve is normal in structure. Tricuspid valve regurgitation is mild. Aortic Valve: The aortic valve is tricuspid. Aortic valve regurgitation is not visualized. No aortic stenosis is present. Aortic valve mean gradient measures 5.0 mmHg. Aortic valve peak gradient measures 8.2 mmHg. Aortic valve area, by VTI measures 1.45 cm. Pulmonic Valve: The pulmonic valve was not well visualized. Pulmonic valve regurgitation is trivial. Aorta: The aortic root and ascending aorta are structurally normal, with no evidence of dilitation. Venous: The inferior vena cava is dilated in size with greater than 50% respiratory variability, suggesting right atrial pressure of 8 mmHg. IAS/Shunts: The interatrial septum was not well visualized.  LEFT VENTRICLE  PLAX 2D LVIDd:         4.10 cm     Diastology LVIDs:         3.00 cm     LV e' medial:    3.81 cm/s LV PW:         1.10 cm     LV E/e' medial:  30.4 LV IVS:        1.10 cm     LV e' lateral:   9.25 cm/s LVOT diam:     1.70 cm     LV E/e' lateral: 12.5 LV SV:         45 LV SV Index:   27 LVOT Area:     2.27 cm  LV Volumes (MOD) LV vol d, MOD A2C: 67.8 ml LV vol d, MOD A4C: 72.0 ml LV vol s, MOD A2C: 38.2 ml LV vol s, MOD A4C: 44.4 ml LV SV MOD A2C:     29.6 ml LV SV MOD A4C:     72.0 ml LV SV MOD BP:      28.6 ml RIGHT VENTRICLE RV Basal diam:  4.00 cm RV Mid diam:    2.70 cm  LEFT ATRIUM             Index       RIGHT ATRIUM           Index LA diam:        4.10 cm 2.42 cm/m  RA Area:     10.40 cm LA Vol (A2C):   55.5 ml 32.81 ml/m RA Volume:   21.90 ml  12.95 ml/m LA Vol (A4C):   69.4 ml 41.03 ml/m LA Biplane Vol: 63.5 ml 37.54 ml/m  AORTIC VALVE                    PULMONIC VALVE AV Area (Vmax):    1.39 cm     PV Vmax:       0.68 m/s AV Area (Vmean):   1.33 cm     PV Vmean:      49.300 cm/s AV Area (VTI):     1.45 cm     PV VTI:        0.175 m AV Vmax:           143.00 cm/s  PV Peak grad:  1.9 mmHg AV Vmean:          100.000 cm/s PV Mean grad:  1.0 mmHg AV VTI:            0.314 m AV Peak Grad:      8.2 mmHg AV Mean Grad:      5.0 mmHg LVOT Vmax:         87.70 cm/s LVOT Vmean:        58.800 cm/s LVOT VTI:          0.200 m LVOT/AV VTI ratio: 0.64  AORTA Ao Root diam: 2.40 cm Ao Asc diam:  2.80 cm MITRAL VALVE                 TRICUSPID VALVE MV Area (PHT): 4.39 cm      TR Peak grad:   38.7 mmHg MV Decel Time: 173 msec      TR Vmax:        311.00 cm/s MR Peak grad:    129.0 mmHg MR Mean grad:    84.0 mmHg   SHUNTS MR Vmax:  568.00 cm/s Systemic VTI:  0.20 m MR Vmean:        434.0 cm/s  Systemic Diam: 1.70 cm MR PISA:         2.26 cm MR PISA Eff ROA: 10 mm MR PISA Radius:  0.60 cm MV E velocity: 116.00 cm/s MV A velocity: 104.00 cm/s MV E/A ratio:  1.12 Oswaldo Milian MD Electronically signed by Oswaldo Milian MD Signature Date/Time: 09/20/2020/3:10:22 PM    Final    VAS [place] LOWER EXTREMITY VENOUS (DVT)  Result Date: 09/20/2020  Lower Venous DVT Study Patient Name:  [name]  Date of Exam:   09/20/2020 Medical Rec #: [id number]     Accession #:    [id number] Date of Birth: 1945/02/26     Patient Gender: F Patient Age:   74Y Exam Location:  [facility] Procedure:      VAS [place] LOWER EXTREMITY VENOUS (DVT) Referring Phys: [id number] Lequita Halt --------------------------------------------------------------------------------  Indications: Edema.  Comparison  Study: no prior Performing Technologist: Archie Patten RVS  Examination Guidelines: A complete evaluation includes B-mode imaging, spectral Doppler, color Doppler, and power Doppler as needed of all accessible portions of each vessel. Bilateral testing is considered an integral part of a complete examination. Limited examinations for reoccurring indications may be performed as noted. The reflux portion of the exam is performed with the patient in reverse Trendelenburg.  +---------+---------------+---------+-----------+----------+--------------+ RIGHT    CompressibilityPhasicitySpontaneityPropertiesThrombus Aging +---------+---------------+---------+-----------+----------+--------------+ CFV      Full           Yes      Yes                                 +---------+---------------+---------+-----------+----------+--------------+ SFJ      Full                                                        +---------+---------------+---------+-----------+----------+--------------+ FV Prox  Full                                                        +---------+---------------+---------+-----------+----------+--------------+ FV Mid   Full                                                        +---------+---------------+---------+-----------+----------+--------------+ FV DistalFull                                                        +---------+---------------+---------+-----------+----------+--------------+ PFV      Full                                                        +---------+---------------+---------+-----------+----------+--------------+  POP      Full           Yes      Yes                                 +---------+---------------+---------+-----------+----------+--------------+ PTV      Full                                                        +---------+---------------+---------+-----------+----------+--------------+ PERO     Full                                                         +---------+---------------+---------+-----------+----------+--------------+   +---------+---------------+---------+-----------+----------+--------------+ LEFT     CompressibilityPhasicitySpontaneityPropertiesThrombus Aging +---------+---------------+---------+-----------+----------+--------------+ CFV      Full           Yes      Yes                                 +---------+---------------+---------+-----------+----------+--------------+ SFJ      Full                                                        +---------+---------------+---------+-----------+----------+--------------+ FV Prox  Full                                                        +---------+---------------+---------+-----------+----------+--------------+ FV Mid   Full                                                        +---------+---------------+---------+-----------+----------+--------------+ FV DistalFull                                                        +---------+---------------+---------+-----------+----------+--------------+ PFV      Full                                                        +---------+---------------+---------+-----------+----------+--------------+ POP      Full           Yes      Yes                                 +---------+---------------+---------+-----------+----------+--------------+  PTV      Full                                                        +---------+---------------+---------+-----------+----------+--------------+ PERO     Full                                                        +---------+---------------+---------+-----------+----------+--------------+     Summary: BILATERAL: - No evidence of deep vein thrombosis seen in the lower extremities, bilaterally. -No evidence of popliteal cyst, bilaterally.   *See table(s) above for measurements and observations.    Preliminary                 LOS: 1 day   Kalecia Hartney  [facility]   Pager on [email or web address]. If 7PM-7AM, please contact night-coverage at [email or web address]     09/22/2020, 10:34 AM

## 2020-09-22 NOTE — Progress Notes (Signed)
Pt called asking for CBG check. CBG found to be 38. Pt alert and able to take PO glucose gel. 15 minute recheck was 76. 15 grams of carbs given as snack.

## 2020-09-22 NOTE — Plan of Care (Signed)

## 2020-09-22 NOTE — Progress Notes (Signed)
Heart Failure Stewardship Pharmacist Progress Note   PCP: Stephani Police, MD PCP-Cardiologist: Glenetta Hew, MD    HPI:  75 yo F with PMH of CHF, HTN, CKD III, T2DM, and HLD. She presented to the ED on 8/1 with shortness of breath and LE edema in the setting of dietary noncompliance. Noted weight gain of 20 lbs in 3-5 days. CXR on admission with pulmonary edema. An ECHO was done on 8/1 and LVEF 50-55% (55-60% in 05/2016; 20% in 04/2016).  Current HF Medications: Furosemide 40 mg IV BID Carvedilol 6.25 mg BID Entresto 49/51 mg BID  Prior to admission HF Medications: Furosemide 20 mg daily Carvedilol 6.25 mg BID Entresto 49/51 mg BID  Pertinent Lab Values: Serum creatinine 1.27, BUN 28, Potassium 3.6, Sodium 137, BNP 1638.7, Magnesium 2.0  A1c 14% (09/20/20)  Vital Signs: Weight: 137 lbs (admission weight: 144 lbs) Blood pressure: 130/70s  Heart rate: 70s   Medication Assistance / Insurance Benefits Check: Does the patient have prescription insurance?  No  Does the patient qualify for medication assistance through manufacturers or grants?   Yes Eligible grants and/or patient assistance programs: Entresto Medication assistance applications in progress: none  Medication assistance applications approved: Entresto Approved medication assistance renewals will be completed by: [place]:  Prior to admission outpatient pharmacy: [facility] Is the patient willing to use [facility] pharmacy at discharge? Yes Is the patient willing to transition their outpatient pharmacy to utilize a [facility] outpatient pharmacy?   Pending    Assessment: 1. Acute on chronic HFimpEF (EF 50-55%), due to NICM. NYHA class III symptoms. - Continue furosemide 40 mg IV BID - Continue carvedilol 6.25 mg BID - Continue Entresto 49/51 mg BID - Consider starting spironolactone 12.5 mg daily - No SGLT2i with A1c of 14 on 8/1   Plan: 1) Medication changes recommended at this time: - Start  spironolactone 12.5 mg daily  2) Patient assistance: - Already approved to receive free Entresto through [employer] - HF TOC appt made for 8/11  3)  Education  - To be completed prior to discharge  Kerby Nora, PharmD, BCPS Heart Failure Cytogeneticist Phone [phone]

## 2020-09-23 LAB — BASIC METABOLIC PANEL
Anion gap: 10 (ref 5–15)
BUN: 36 mg/dL — ABNORMAL HIGH (ref 8–23)
CO2: 28 mmol/L (ref 22–32)
Calcium: 8.8 mg/dL — ABNORMAL LOW (ref 8.9–10.3)
Chloride: 97 mmol/L — ABNORMAL LOW (ref 98–111)
Creatinine, Ser: 1.37 mg/dL — ABNORMAL HIGH (ref 0.44–1.00)
GFR, Estimated: 40 mL/min — ABNORMAL LOW (ref >60)
Glucose, Bld: 75 mg/dL (ref 70–99)
Potassium: 3.7 mmol/L (ref 3.5–5.1)
Sodium: 135 mmol/L (ref 135–145)

## 2020-09-23 LAB — GLUCOSE, CAPILLARY
Glucose-Capillary: 45 mg/dL — ABNORMAL LOW (ref 70–99)
Glucose-Capillary: 64 mg/dL — ABNORMAL LOW (ref 70–99)
Glucose-Capillary: 115 mg/dL — ABNORMAL HIGH (ref 70–99)
Glucose-Capillary: 238 mg/dL — ABNORMAL HIGH (ref 70–99)
Glucose-Capillary: 201 mg/dL — ABNORMAL HIGH (ref 70–99)

## 2020-09-23 LAB — MAGNESIUM: Magnesium: 2 mg/dL (ref 1.7–2.4)

## 2020-09-23 MED ORDER — INSULIN ASPART PROT & ASPART (70-30 MIX) 100 UNIT/ML ~~LOC~~ SUSP
25.0000 [IU] | Freq: Every day | SUBCUTANEOUS | Status: DC
  Administered 2020-09-23: 25 [IU] via SUBCUTANEOUS
  Filled 2020-09-23: qty 10

## 2020-09-23 MED ORDER — INSULIN ASPART PROT & ASPART (70-30 MIX) 100 UNIT/ML ~~LOC~~ SUSP
15.0000 [IU] | Freq: Every day | SUBCUTANEOUS | Status: DC
  Filled 2020-09-23: qty 10

## 2020-09-23 MED ORDER — GLUCOSE 40 % PO GEL
ORAL | Status: AC
  Filled 2020-09-23: qty 1

## 2020-09-23 MED ORDER — INSULIN NPH ISOPHANE & REGULAR (70-30) 100 UNIT/ML ~~LOC~~ SUSP: 15.0000 [IU] | Freq: Two times a day (BID) | SUBCUTANEOUS | Status: DC

## 2020-09-23 MED ORDER — FUROSEMIDE 40 MG PO TABS: 40.0000 mg | ORAL_TABLET | Freq: Every day | ORAL | 0 refills | Status: DC

## 2020-09-23 MED ORDER — ATORVASTATIN CALCIUM 40 MG PO TABS: 40.0000 mg | ORAL_TABLET | Freq: Every day | ORAL | Status: DC

## 2020-09-23 MED ORDER — GLUCOSE 40 % PO GEL
2.0000 | ORAL | Status: AC
  Administered 2020-09-23: 62 g via ORAL

## 2020-09-23 NOTE — Discharge Summary (Signed)
Physician Discharge Summary  [name] [id number] DOB: [birth year] DOA: 09/20/2020  PCP: Stephani Police, MD  Admit date: 09/20/2020 Discharge date: 09/23/2020  Discharge disposition: Home   Recommendations for Outpatient Follow-Up:   Follow-up with PCP in 1 week   Discharge Diagnosis:   Active Problems:   Acute CHF (congestive heart failure) (HCC)   CHF (congestive heart failure) ([place])   CHF exacerbation ([place])    Discharge Condition: Stable.  Diet recommendation:  Diet Order             Diet - low sodium heart healthy           Diet Carb Modified           Diet renal/carb modified with fluid restriction Diet-HS Snack? Nothing; Fluid restriction: 1200 mL Fluid; Room service appropriate? Yes; Fluid consistency: Thin  Diet effective now                     Code Status: Full Code     Hospital Course:   [name] is a 75 y.o. female with medical history significant of chronic combined systolic and diastolic CHF, HTN, CKD stage IIIb, IIDM, HLD, who presented with increasing shortness of breath and leg swelling.  She admitted to dietary indiscretion and medical nonadherence.  She was admitted to the hospital for acute on chronic diastolic CHF.  She was treated with IV Lasix.  2D echo showed EF estimated at 50 to [id number], grade 2 diastolic dysfunction, mild LVH.  According to [name], her [name], patient needs to take Lasix 40 mg daily but this was decreased to 20 mg daily because there was concern for worsening kidney function/dehydration a few weeks prior to admission.  She also has insulin-dependent diabetes mellitus with hyperglycemia and hemoglobin A1c was 14.  Insulin dose was adjusted (increased to 30 units before breakfast and 20 units before dinner).  She developed hypoglycemia on this insulin dose.  It was later discovered that patient was supposed to be taking Insulin 70/30 15 units twice daily instead of insulin 70/30 25 units before breakfast and  15 units before dinner.  According to [name], her [name], patient's PCP had decreased her insulin dose to 15 units twice daily about 3 weeks prior to admission.  Overall, her condition has improved and she is deemed stable for discharge to home today.  Discharge plan was discussed with the patient and [name] ([name]) in detail.  All her questions were answered.  Close follow-up with PCP for ongoing management will strongly recommended.      Discharge Exam:    Vitals:   09/22/20 1548 09/22/20 1941 09/23/20 0339 09/23/20 0834  BP: (!) 105/55 134/79 124/66 129/68  Pulse: 66 70 (!) 58 60  Resp: '17 18 18 18  '$ Temp: 98.1 F (36.7 C) 97.8 F (36.6 C) 98 F (36.7 C) 98 F (36.7 C)  TempSrc: Oral Oral Oral Oral  SpO2: 100% 100% 100% 98%  Weight:   62.2 kg   Height:         GEN: NAD SKIN: Warm and dry EYES: No pallor or icterus ENT: MMM CV: RRR PULM: CTA B ABD: soft, ND, NT, +BS CNS: AAO x 3, non focal EXT: Trace bilateral ankle edema.  No tenderness or erythema.   The results of significant diagnostics from this hospitalization (including imaging, microbiology, ancillary and laboratory) are listed below for reference.     Procedures and Diagnostic Studies:   DG Chest Port 1  View  Result Date: 09/21/2020 CLINICAL DATA:  CHF. EXAM: PORTABLE CHEST 1 VIEW COMPARISON:  09/20/2020 FINDINGS: Normal heart size and mediastinal contours. No acute infiltrate or edema. No effusion or pneumothorax. No acute osseous findings. IMPRESSION: No active disease. Electronically Signed   By: Monte Fantasia M.D.   On: 09/21/2020 07:19   DG Chest Port 1 View  Result Date: 09/20/2020 CLINICAL DATA:  Shortness of breath, generalized weakness. Peripheral edema in lower extremities. EXAM: PORTABLE CHEST 1 VIEW COMPARISON:  08/20/2017 and CT chest 05/05/2016. FINDINGS: Trachea is midline. Heart size stable. Thoracic aorta is calcified. Mixed interstitial and airspace opacification bilaterally. No  pleural fluid. IMPRESSION: Mild pulmonary edema. Electronically Signed   By: Lorin Picket M.D.   On: 09/20/2020 11:22   ECHOCARDIOGRAM COMPLETE  Result Date: 09/20/2020    ECHOCARDIOGRAM REPORT   Patient Name:   [name] Date of Exam: 09/20/2020 Medical Rec #:  [id number]    Height:       63.5 in Accession #:    [id number]   Weight:       144.0 lb Date of Birth:  June 01, 1945    BSA:          1.692 m Patient Age:    48 years     BP:           128/65 mmHg Patient Gender: F            HR:           68 bpm. Exam Location:  Inpatient Procedure: 2D Echo, Cardiac Doppler and Color Doppler Indications:    CHF  History:        Patient has prior history of Echocardiogram examinations, most                 recent 10/24/2016. CHF and Cardiomyopathy, CAD, COPD; Risk                 Factors:Diabetes, Hypertension and Dyslipidemia. 05/08/2016 right                 heart cath                 12/20/2015 left heart cath.  Sonographer:    [place] Referring Phys: [id number] [place]  1. Left ventricular ejection fraction, by estimation, is 50 to 55%. The left ventricle has low normal function. The left ventricle has no regional wall motion abnormalities. There is mild left ventricular hypertrophy. Left ventricular diastolic parameters are consistent with Grade II diastolic dysfunction (pseudonormalization). Elevated left atrial pressure.  2. Right ventricular systolic function is normal. The right ventricular size is normal. There is moderately elevated pulmonary artery systolic pressure. The estimated right ventricular systolic pressure is 123456 mmHg.  3. Left atrial size was mildly dilated.  4. The mitral valve is normal in structure. Mild mitral valve regurgitation.  5. The aortic valve is tricuspid. Aortic valve regurgitation is not visualized. No aortic stenosis is present.  6. The inferior vena cava is dilated in size with >50% respiratory variability, suggesting right atrial pressure of 8 mmHg. FINDINGS   Left Ventricle: Left ventricular ejection fraction, by estimation, is 50 to 55%. The left ventricle has low normal function. The left ventricle has no regional wall motion abnormalities. The left ventricular internal cavity size was normal in size. There is mild left ventricular hypertrophy. Left ventricular diastolic parameters are consistent with Grade II diastolic dysfunction (pseudonormalization). Elevated left atrial pressure. Right Ventricle:  The right ventricular size is normal. No increase in right ventricular wall thickness. Right ventricular systolic function is normal. There is moderately elevated pulmonary artery systolic pressure. The tricuspid regurgitant velocity is 3.11 m/s, and with an assumed right atrial pressure of 8 mmHg, the estimated right ventricular systolic pressure is 123456 mmHg. Left Atrium: Left atrial size was mildly dilated. Right Atrium: Right atrial size was normal in size. Pericardium: There is no evidence of pericardial effusion. Mitral Valve: The mitral valve is normal in structure. Mild mitral valve regurgitation. Tricuspid Valve: The tricuspid valve is normal in structure. Tricuspid valve regurgitation is mild. Aortic Valve: The aortic valve is tricuspid. Aortic valve regurgitation is not visualized. No aortic stenosis is present. Aortic valve mean gradient measures 5.0 mmHg. Aortic valve peak gradient measures 8.2 mmHg. Aortic valve area, by VTI measures 1.45 cm. Pulmonic Valve: The pulmonic valve was not well visualized. Pulmonic valve regurgitation is trivial. Aorta: The aortic root and ascending aorta are structurally normal, with no evidence of dilitation. Venous: The inferior vena cava is dilated in size with greater than 50% respiratory variability, suggesting right atrial pressure of 8 mmHg. IAS/Shunts: The interatrial septum was not well visualized.  LEFT VENTRICLE PLAX 2D LVIDd:         4.10 cm     Diastology LVIDs:         3.00 cm     LV e' medial:    3.81 cm/s LV PW:          1.10 cm     LV E/e' medial:  30.4 LV IVS:        1.10 cm     LV e' lateral:   9.25 cm/s LVOT diam:     1.70 cm     LV E/e' lateral: 12.5 LV SV:         45 LV SV Index:   27 LVOT Area:     2.27 cm  LV Volumes (MOD) LV vol d, MOD A2C: 67.8 ml LV vol d, MOD A4C: 72.0 ml LV vol s, MOD A2C: 38.2 ml LV vol s, MOD A4C: 44.4 ml LV SV MOD A2C:     29.6 ml LV SV MOD A4C:     72.0 ml LV SV MOD BP:      28.6 ml RIGHT VENTRICLE RV Basal diam:  4.00 cm RV Mid diam:    2.70 cm LEFT ATRIUM             Index       RIGHT ATRIUM           Index LA diam:        4.10 cm 2.42 cm/m  RA Area:     10.40 cm LA Vol (A2C):   55.5 ml 32.81 ml/m RA Volume:   21.90 ml  12.95 ml/m LA Vol (A4C):   69.4 ml 41.03 ml/m LA Biplane Vol: 63.5 ml 37.54 ml/m  AORTIC VALVE                    PULMONIC VALVE AV Area (Vmax):    1.39 cm     PV Vmax:       0.68 m/s AV Area (Vmean):   1.33 cm     PV Vmean:      49.300 cm/s AV Area (VTI):     1.45 cm     PV VTI:        0.175 m AV Vmax:  143.00 cm/s  PV Peak grad:  1.9 mmHg AV Vmean:          100.000 cm/s PV Mean grad:  1.0 mmHg AV VTI:            0.314 m AV Peak Grad:      8.2 mmHg AV Mean Grad:      5.0 mmHg LVOT Vmax:         87.70 cm/s LVOT Vmean:        58.800 cm/s LVOT VTI:          0.200 m LVOT/AV VTI ratio: 0.64  AORTA Ao Root diam: 2.40 cm Ao Asc diam:  2.80 cm MITRAL VALVE                 TRICUSPID VALVE MV Area (PHT): 4.39 cm      TR Peak grad:   38.7 mmHg MV Decel Time: 173 msec      TR Vmax:        311.00 cm/s MR Peak grad:    129.0 mmHg MR Mean grad:    84.0 mmHg   SHUNTS MR Vmax:         568.00 cm/s Systemic VTI:  0.20 m MR Vmean:        434.0 cm/s  Systemic Diam: 1.70 cm MR PISA:         2.26 cm MR PISA Eff ROA: 10 mm MR PISA Radius:  0.60 cm MV E velocity: 116.00 cm/s MV A velocity: 104.00 cm/s MV E/A ratio:  1.12 Oswaldo Milian MD Electronically signed by Oswaldo Milian MD Signature Date/Time: 09/20/2020/3:10:22 PM    Final    VAS [place] LOWER EXTREMITY VENOUS  (DVT)  Result Date: 09/20/2020  Lower Venous DVT Study Patient Name:  VALLERY KOSANKE  Date of Exam:   09/20/2020 Medical Rec #: [id number]     Accession #:    [id number] Date of Birth: 01-24-1946     Patient Gender: F Patient Age:   1Y Exam Location:  [facility] Procedure:      VAS [place] LOWER EXTREMITY VENOUS (DVT) Referring Phys: [id number] Lequita Halt --------------------------------------------------------------------------------  Indications: Edema.  Comparison Study: no prior Performing Technologist: Archie Patten RVS  Examination Guidelines: A complete evaluation includes B-mode imaging, spectral Doppler, color Doppler, and power Doppler as needed of all accessible portions of each vessel. Bilateral testing is considered an integral part of a complete examination. Limited examinations for reoccurring indications may be performed as noted. The reflux portion of the exam is performed with the patient in reverse Trendelenburg.  +---------+---------------+---------+-----------+----------+--------------+ RIGHT    CompressibilityPhasicitySpontaneityPropertiesThrombus Aging +---------+---------------+---------+-----------+----------+--------------+ CFV      Full           Yes      Yes                                 +---------+---------------+---------+-----------+----------+--------------+ SFJ      Full                                                        +---------+---------------+---------+-----------+----------+--------------+ FV Prox  Full                                                        +---------+---------------+---------+-----------+----------+--------------+  FV Mid   Full                                                        +---------+---------------+---------+-----------+----------+--------------+ FV DistalFull                                                        +---------+---------------+---------+-----------+----------+--------------+ PFV       Full                                                        +---------+---------------+---------+-----------+----------+--------------+ POP      Full           Yes      Yes                                 +---------+---------------+---------+-----------+----------+--------------+ PTV      Full                                                        +---------+---------------+---------+-----------+----------+--------------+ PERO     Full                                                        +---------+---------------+---------+-----------+----------+--------------+   +---------+---------------+---------+-----------+----------+--------------+ LEFT     CompressibilityPhasicitySpontaneityPropertiesThrombus Aging +---------+---------------+---------+-----------+----------+--------------+ CFV      Full           Yes      Yes                                 +---------+---------------+---------+-----------+----------+--------------+ SFJ      Full                                                        +---------+---------------+---------+-----------+----------+--------------+ FV Prox  Full                                                        +---------+---------------+---------+-----------+----------+--------------+ FV Mid   Full                                                        +---------+---------------+---------+-----------+----------+--------------+  FV DistalFull                                                        +---------+---------------+---------+-----------+----------+--------------+ PFV      Full                                                        +---------+---------------+---------+-----------+----------+--------------+ POP      Full           Yes      Yes                                 +---------+---------------+---------+-----------+----------+--------------+ PTV      Full                                                         +---------+---------------+---------+-----------+----------+--------------+ PERO     Full                                                        +---------+---------------+---------+-----------+----------+--------------+     Summary: BILATERAL: - No evidence of deep vein thrombosis seen in the lower extremities, bilaterally. -No evidence of popliteal cyst, bilaterally.   *See table(s) above for measurements and observations.    Preliminary      Labs:   Basic Metabolic Panel: Recent Labs  Lab 09/20/20 0946 09/20/20 1149 09/20/20 1438 09/21/20 0515 09/22/20 0230 09/23/20 0341  NA 135 137  --  137 137 135  K 5.4* 5.3*  --  4.1 3.6 3.7  CL 104  --   --  101 100 97*  CO2 23  --   --  '24 28 28  '$ GLUCOSE 381*  --   --  204* 71 75  BUN 27*  --   --  29* 28* 36*  CREATININE 1.42*  --   --  1.35* 1.27* 1.37*  CALCIUM 9.4  --   --  9.1 8.7* 8.8*  MG  --   --  2.0  --   --  2.0   GFR Estimated Creatinine Clearance: 30.2 mL/min (A) (by C-G formula based on SCr of 1.37 mg/dL (H)). Liver Function Tests: Recent Labs  Lab 09/20/20 0946  AST 26  ALT 28  ALKPHOS 74  BILITOT 0.8  PROT 6.1*  ALBUMIN 3.3*   No results for input(s): LIPASE, AMYLASE in the last 168 hours. No results for input(s): AMMONIA in the last 168 hours. Coagulation profile Recent Labs  Lab 09/20/20 0946  INR 1.1    CBC: Recent Labs  Lab 09/20/20 0946 09/20/20 1149  WBC 7.4  --   NEUTROABS 4.5  --   HGB 10.4* 10.2*  HCT 30.5* 30.0*  MCV 97.4  --   PLT 243  --  Cardiac Enzymes: No results for input(s): CKTOTAL, CKMB, CKMBINDEX, TROPONINI in the last 168 hours. BNP: Invalid input(s): POCBNP CBG: Recent Labs  Lab 09/23/20 0607 09/23/20 0623 09/23/20 0645 09/23/20 0854 09/23/20 1133  GLUCAP 45* 64* 115* 238* 201*   D-Dimer No results for input(s): DDIMER in the last 72 hours. Hgb A1c Recent Labs    09/20/20 1438  HGBA1C 14.0*   Lipid Profile No results for  input(s): CHOL, HDL, LDLCALC, TRIG, CHOLHDL, LDLDIRECT in the last 72 hours. Thyroid function studies No results for input(s): TSH, T4TOTAL, T3FREE, THYROIDAB in the last 72 hours.  Invalid input(s): FREET3 Anemia work up No results for input(s): VITAMINB12, FOLATE, FERRITIN, TIBC, IRON, RETICCTPCT in the last 72 hours. Microbiology Recent Results (from the past 240 hour(s))  Resp Panel by RT-PCR (Flu A&B, Covid) Urine, Clean Catch     Status: None   Collection Time: 09/20/20 12:26 PM   Specimen: Urine, Clean Catch; Nasopharyngeal(NP) swabs in vial transport medium  Result Value Ref Range Status   SARS Coronavirus 2 by RT PCR NEGATIVE NEGATIVE Final    Comment: (NOTE) SARS-CoV-2 target nucleic acids are NOT DETECTED.  The SARS-CoV-2 RNA is generally detectable in upper respiratory specimens during the acute phase of infection. The lowest concentration of SARS-CoV-2 viral copies this assay can detect is 138 copies/mL. A negative result does not preclude SARS-Cov-2 infection and should not be used as the sole basis for treatment or other patient management decisions. A negative result may occur with  improper specimen collection/handling, submission of specimen other than nasopharyngeal swab, presence of viral mutation(s) within the areas targeted by this assay, and inadequate number of viral copies(<138 copies/mL). A negative result must be combined with clinical observations, patient history, and epidemiological information. The expected result is Negative.  Fact Sheet for Patients:  EntrepreneurPulse.com.au  Fact Sheet for Healthcare Providers:  IncredibleEmployment.be  This test is no t yet approved or cleared by the [place] FDA and  has been authorized for detection and/or diagnosis of SARS-CoV-2 by FDA under an Emergency Use Authorization (EUA). This EUA will remain  in effect (meaning this test can be used) for the duration of  the COVID-19 declaration under Section 564(b)(1) of the Act, 21 U.S.C.section 360bbb-3(b)(1), unless the authorization is terminated  or revoked sooner.       Influenza A by PCR NEGATIVE NEGATIVE Final   Influenza B by PCR NEGATIVE NEGATIVE Final    Comment: (NOTE) The Xpert Xpress SARS-CoV-2/FLU/RSV plus assay is intended as an aid in the diagnosis of influenza from Nasopharyngeal swab specimens and should not be used as a sole basis for treatment. Nasal washings and aspirates are unacceptable for Xpert Xpress SARS-CoV-2/FLU/RSV testing.  Fact Sheet for Patients: EntrepreneurPulse.com.au  Fact Sheet for Healthcare Providers: IncredibleEmployment.be  This test is not yet approved or cleared by the [place] FDA and has been authorized for detection and/or diagnosis of SARS-CoV-2 by FDA under an Emergency Use Authorization (EUA). This EUA will remain in effect (meaning this test can be used) for the duration of the COVID-19 declaration under Section 564(b)(1) of the Act, 21 U.S.C. section 360bbb-3(b)(1), unless the authorization is terminated or revoked.  Performed at [facility], [place] [address] [place], [place] [address]      Discharge Instructions:   Discharge Instructions     (HEART FAILURE PATIENTS) Call MD:  Anytime you have any of the following symptoms: 1) 3 pound weight gain in 24 hours or 5 pounds in 1  week 2) shortness of breath, with or without a dry hacking cough 3) swelling in the hands, feet or stomach 4) if you have to sleep on extra pillows at night in order to breathe.   Complete by: As directed    AMB referral to CHF clinic   Complete by: As directed    Call MD for:  difficulty breathing, headache or visual disturbances   Complete by: As directed    Call MD for:  persistant dizziness or light-headedness   Complete by: As directed    Diet - low sodium heart healthy   Complete by: As directed    Diet Carb  Modified   Complete by: As directed    Heart Failure patients record your daily weight using the same scale at the same time of day   Complete by: As directed    Increase activity slowly   Complete by: As directed    Schedule appointment   Complete by: As directed    Follow up with PCP in 1 week      Allergies as of 09/23/2020       Reactions   Tape Rash   Pt can tolerate the paper tape        Medication List     STOP taking these medications    loperamide 2 MG tablet Commonly known as: IMODIUM A-D       TAKE these medications    atorvastatin 40 MG tablet Commonly known as: LIPITOR Take 1 tablet (40 mg total) by mouth daily.   Back & Body Extra Strength 500-32.5 MG Tabs Generic drug: Aspirin-Caffeine Take 1 tablet by mouth every 6 (six) hours as needed (pain).   carvedilol 6.25 MG tablet Commonly known as: COREG Take 1 tablet (6.25 mg total) by mouth 2 (two) times daily.   furosemide 40 MG tablet Commonly known as: LASIX Take 1 tablet (40 mg total) by mouth daily. What changed:  medication strength how much to take Another medication with the same name was removed. Continue taking this medication, and follow the directions you see here.   insulin NPH-regular Human (70-30) 100 UNIT/ML injection Inject 15 Units into the skin 2 (two) times daily with a meal.   sacubitril-valsartan 49-51 MG Commonly known as: Entresto Take 1 tablet by mouth 2 (two) times daily.               Durable Medical Equipment  (From admission, onward)           Start     Ordered   09/23/20 1027  For home use only DME 4 wheeled rolling walker with seat  Once       Question:  Patient needs a walker to treat with the following condition  Answer:  Weakness   09/23/20 [place] [facility] Follow up.   Why: rollator Contact information: [address] [place] [address] [phone]                   Time coordinating discharge: 33 minutes  Signed:  Jennye Boroughs  [facility] 09/23/2020, 1:05 PM   Pager on [email or web address]. If 7PM-7AM, please contact night-coverage at [email or web address]

## 2020-09-23 NOTE — Progress Notes (Signed)
  Mobility Specialist Criteria Algorithm Info.  Mobility Team: HOB elevated: Activity: Ambulated in hall; Dangled on edge of bed Range of motion: Active; All extremities Level of assistance: Standby assist, set-up cues, supervision of patient - no hands on (Min G w/cane) Assistive device: Four wheel walker; Cane Minutes sitting in chair:  Minutes stood: 7 minutes Minutes ambulated: 7 minutes Distance ambulated (ft): 520 ft (260+260) Mobility response: Tolerated well (LE weakness, fatigue, unsteadiness) Bed Position: Semi-fowlers   Patient received lying supine in bed eager and willing to participate in mobility with anticipation to be discharged soon. Pt reported being more unsteady recently but doesn't use any assistive devices, typically uses wall, furniture or physical assistance to steady. First trial, ambulated 260 feet in hallway using cane with slightly unsteady gait requiring min guard to steady. 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 Second trial, ambulated 260 feet in hallway at supervision level using Rolator with steady gait. Tolerates using Rolator better than cane and was less fatigued in second trial. Completed education on energy exertion, LE strengthening exercises, and use of RW. Did well with ambulation and was left dangling EOB with all needs met. Spoke with CM about patients request for Rolator before discharge.   09/23/2020 11:43 AM

## 2020-09-23 NOTE — Progress Notes (Signed)
Morning CBG check of 45. Pt alert and able to take PO glutose gel. Pt reported feeling hot and was diaphoretic.

## 2020-09-23 NOTE — TOC Transition Note (Signed)
Transition of Care [facility]) - CM/SW Discharge Note   Patient Details  Name: [name] MRN: [id number] Date of Birth: January 26, 1946  [facility] CM/SW Contact:  Zenon Mayo, RN Phone Number: 09/23/2020, 1:24 PM   Clinical Narrative:    Patient is for dc home, rollator is in the room ,her daughter will transport her home. She has a scale at home and a bp cuff. She consumes a low sodium diet.  She has no other needs.   Final next level of care: Home/Self Care Barriers to Discharge: No Barriers Identified   Patient Goals and CMS Choice Patient states their goals for this hospitalization and ongoing recovery are:: return home      Discharge Placement                       Discharge Plan and Services                                     Social Determinants of Health (SDOH) Interventions     Readmission Risk Interventions No flowsheet data found.

## 2020-09-23 NOTE — Progress Notes (Signed)
Pt has orders to be discharged. Discharge instructions given and pt has no additional questions at this time. Medication regimen reviewed and pt educated. Pt verbalized understanding and has no additional questions. Telemetry box removed. IV removed and site in good condition. Pt stable and waiting for transportation. 

## 2020-09-23 NOTE — Plan of Care (Signed)
  Problem: Activity: Goal: Risk for activity intolerance will decrease 09/23/2020 1046 by Mariane Baumgarten, RN Outcome: Progressing 09/23/2020 1046 by Mariane Baumgarten, RN Outcome: Progressing   Problem: Nutrition: Goal: Adequate nutrition will be maintained 09/23/2020 1046 by Mariane Baumgarten, RN Outcome: Progressing 09/23/2020 1046 by Mariane Baumgarten, RN Outcome: Progressing   Problem: Coping: Goal: Level of anxiety will decrease Outcome: Progressing   Problem: Elimination: Goal: Will not experience complications related to bowel motility Outcome: Progressing Goal: Will not experience complications related to urinary retention Outcome: Progressing

## 2020-09-28 ENCOUNTER — Ambulatory Visit
Admission: RE | Admit: 2020-09-28 | Discharge: 2020-09-28 | Disposition: A | Discharge status: Home / Self Care | Payer: Medicare Other | Source: Ambulatory Visit | Attending: Family Medicine | Admitting: Family Medicine

## 2020-09-28 DIAGNOSIS — G5600 Carpal tunnel syndrome, unspecified upper limb: Secondary | ICD-10-CM

## 2020-09-28 DIAGNOSIS — M5412 Radiculopathy, cervical region: Secondary | ICD-10-CM

## 2020-09-28 DIAGNOSIS — R279 Unspecified lack of coordination: Secondary | ICD-10-CM

## 2020-09-30 ENCOUNTER — Encounter (HOSPITAL_COMMUNITY): Payer: Medicare Other

## 2020-10-09 ENCOUNTER — Other Ambulatory Visit: Payer: Self-pay | Admitting: Cardiology

## 2020-10-12 ENCOUNTER — Other Ambulatory Visit: Payer: Self-pay | Admitting: Cardiology

## 2020-11-10 ENCOUNTER — Encounter: Payer: Self-pay | Admitting: Endocrinology

## 2020-11-10 ENCOUNTER — Other Ambulatory Visit: Payer: Self-pay

## 2020-11-10 ENCOUNTER — Ambulatory Visit (INDEPENDENT_AMBULATORY_CARE_PROVIDER_SITE_OTHER): Payer: Medicare Other | Admitting: Endocrinology

## 2020-11-10 VITALS — BP 100/42 | HR 71 | Ht 63.0 in | Wt 128.2 lb

## 2020-11-10 DIAGNOSIS — E119 Type 2 diabetes mellitus without complications: Secondary | ICD-10-CM | POA: Diagnosis not present

## 2020-11-10 DIAGNOSIS — Z794 Long term (current) use of insulin: Secondary | ICD-10-CM

## 2020-11-10 LAB — POCT GLYCOSYLATED HEMOGLOBIN (HGB A1C): Hemoglobin A1C: 11.3 % — AB (ref 4.0–5.6)

## 2020-11-10 MED ORDER — INSULIN NPH ISOPHANE & REGULAR (70-30) 100 UNIT/ML ~~LOC~~ SUSP: SUBCUTANEOUS | Status: DC

## 2020-11-10 NOTE — Patient Instructions (Addendum)
good diet and exercise significantly improve the control of your diabetes.  please let me know if you wish to be referred to a dietician.  high blood sugar is very risky to your health.  you should see an eye doctor and dentist every year.  It is very important to get all recommended vaccinations.  Controlling your blood pressure and cholesterol drastically reduces the damage diabetes does to your body.  Those who smoke should quit.  Please discuss these with your doctor.  check your blood sugar twice a day.  vary the time of day when you check, between before the 3 meals, and at bedtime.  also check if you have symptoms of your blood sugar being too high or too low.  please keep a record of the readings and bring it to your next appointment here (or you can bring the meter itself).  You can write it on any piece of paper.  please call us sooner if your blood sugar goes below 70, or if most of your readings are over 200. We are placing a continuous glucose monitor sensor today.  Please change the insulin to 20 units with breakfast, and 12 units with supper.  Please come back for a follow-up appointment in 2 weeks.

## 2020-11-10 NOTE — Progress Notes (Signed)
Subjective:    Patient ID: [name], female    DOB: 22-Sep-1945, 75 y.o.   MRN: [id number]  HPI Dtr provides some hx, due to pt's poor health.  pt is referred by Dr Ellyn Hack, for diabetes.  Pt states DM was dx'ed in; she is unaware of any chronic complicated by DR, PN, and CRI; she has been on insulin since 2016; pt says her diet is good, but exercise is limited by health problems; she has never had GDM, pancreatitis, pancreatic surgery, or DKA.  Only episode of severe hypoglycemia was in the hospital.  She takes 70/30, 15 units BID.  She seldom has hypoglycemia, and these episodes are mild.  She says cbg varies from 110-400.  It is in general higher as the day goes on.  Pt says she seldom misses the insulin.   Past Medical History:  Diagnosis Date   Breast cancer ([place])    breast - left    Chronic combined systolic and diastolic CHF (congestive heart failure) (HCC) -Systolic Dysfunction resolved.    a. cMRI 5/10: EF 38% // b. Echo 3/13/: EF 50-55, Gr 1 DD // c. Echo 8/14: EF 40, inf/inf-septal HK, Gr 1 DD, mild MR // d. Echo 5/17: EF 40-45, inf HK, Gr 1 DD, mild MR, mild LAE, reduced RVSF, mild TR, PASP 33 // e. Echo 10/17: EF 40-45, Gr 2 DD, mild MR, severe LAE   Coronary artery disease, non-occlusive    a. nonobs by LHC in 2010 // b. Myoview 10/17: Lg infarct apex, distal ant and lat walls, no ischemia, EF 38; int risk  // c. [place] 10/17:  dLAD 15, pLCx 14, mRCA 40, LVEDP 15   Diabetes mellitus    NICM (nonischemic cardiomyopathy) ([place])     Past Surgical History:  Procedure Laterality Date   APPENDECTOMY     BREAST LUMPECTOMY     lt breast   CARDIAC CATHETERIZATION N/A 12/20/2015   Procedure: Left Heart Cath and Coronary Angiography;  Surgeon: Nelva Bush, MD:: Mild to moderate, nonobstructive coronary artery disease, including 15% distal LAD stenosis, 30% ulcerated mid LCx disease, and 40% mid RCA stenosis.  Upper normal left ventricular filling pressure (LVEDP 15 mmHg).   RIGHT  HEART CATH N/A 05/08/2016   Procedure: Right Heart Cath;  Surgeon: Jolaine Artist, MD;  [facility];; RA = 3, RV = 34/5, PA = 38/12 (21), PCW = 12; Fick cardiac output/index = 4.6/2.6; PVR = 1.9 WU, Ao sat = 96%; PA sat = 58%, 62%    TRANSTHORACIC ECHOCARDIOGRAM  10/'17; 3/'18   a) EF 40 and 45%.  GRII DD; b) 04/2016: EF 20% with grade 2 diastolic dysfunction   TRANSTHORACIC ECHOCARDIOGRAM  10/2016   EF 55-60%.  Normal LV function.  Mild diastolic dysfunction.  Normal valve function   TRIGGER FINGER RELEASE Left 04/23/2017   Procedure: RELEASE TRIGGER FINGER TO LEFT FORTH FINGER;  Surgeon: Cristine Polio, MD;  Location: [facility];  Service: Plastics;  Laterality: Left;    Social History   Socioeconomic History   Marital status: Widowed    Spouse name: Not on file   Number of children: Not on file   Years of education: Not on file   Highest education level: Not on file  Occupational History   Not on file  Tobacco Use   Smoking status: Former    Years: 30.00    Types: Cigarettes    Quit date: 09/29/2012  Years since quitting: 8.1   Smokeless tobacco: Never  Substance and Sexual Activity   Alcohol use: No   Drug use: No   Sexual activity: Not on file  Other Topics Concern   Not on file  Social History Narrative   Not on file   Social Determinants of Health   Financial Resource Strain: Not on file  Food Insecurity: Not on file  Transportation Needs: Not on file  Physical Activity: Not on file  Stress: Not on file  Social Connections: Not on file  Intimate Partner Violence: Not on file    Current Outpatient Medications on File Prior to Visit  Medication Sig Dispense Refill   Aspirin-Caffeine (BACK & BODY EXTRA STRENGTH) 500-32.5 MG TABS Take 1 tablet by mouth every 6 (six) hours as needed (pain).     atorvastatin (LIPITOR) 40 MG tablet Take 1 tablet (40 mg total) by mouth daily.     carvedilol (COREG) 6.25 MG tablet Take 1 tablet  (6.25 mg total) by mouth 2 (two) times daily. 180 tablet 3   furosemide (LASIX) 40 MG tablet MAY TAKE 1 TABLET BY MOUTH UP TO TWICE A DAY AS NEEDED FOR SWELLING 180 tablet 0   sacubitril-valsartan (ENTRESTO) 49-51 MG Take 1 tablet by mouth 2 (two) times daily. 180 tablet 3   No current facility-administered medications on file prior to visit.    Allergies  Allergen Reactions   Tape Rash    Pt can tolerate the paper tape    Family History  Problem Relation Age of Onset   Stroke Father    Diabetes Maternal Grandmother     BP (!) 100/42 (BP Location: Right Arm, Patient Position: Sitting, Cuff Size: Normal)   Pulse 71   Ht 5\' 3"  (1.6 m)   Wt 128 lb 3.2 oz (58.2 kg)   SpO2 97%   BMI 22.71 kg/m     Review of Systems denies recent weight change, sob, n/v, memory loss.  She has chronic depression.      Objective:   Physical Exam Pulses: dorsalis pedis intact bilat.   MSK: no deformity of the feet CV: no leg edema Skin:  no ulcer on the feet.  normal color and temp on the feet.  Old healed surgical scars on both feet.   Neuro: sensation is intact to touch on the feet, but decreased from normal.   Ext: there is bilateral onychomycosis of the toenails.    Lab Results  Component Value Date   HGBA1C 11.3 (A) 11/10/2020    Lab Results  Component Value Date   CREATININE 1.37 (H) 09/23/2020   BUN 36 (H) 09/23/2020   NA 135 09/23/2020   K 3.7 09/23/2020   CL 97 (L) 09/23/2020   CO2 28 09/23/2020   I have reviewed outside records, and summarized: Pt was noted to have elevated A1c, and referred here.  There was uncertainty in the hosp about pt's outpt insulin dosage.      Assessment & Plan:  Insulin-requiring type 2 DM: severe exacerbation. We discussed options.  She would like to work with the current inexpensive insulin.  We are placing continuous glucose monitor, as pt is at risk for hypoglycemia.   Lean body habitus: she is at risk for evolving type 1 DM.  Patient  Instructions  good diet and exercise significantly improve the control of your diabetes.  please let me know if you wish to be referred to a dietician.  high blood sugar is very risky to  your health.  you should see an eye doctor and dentist every year.  It is very important to get all recommended vaccinations.  Controlling your blood pressure and cholesterol drastically reduces the damage diabetes does to your body.  Those who smoke should quit.  Please discuss these with your doctor.  check your blood sugar twice a day.  vary the time of day when you check, between before the 3 meals, and at bedtime.  also check if you have symptoms of your blood sugar being too high or too low.  please keep a record of the readings and bring it to your next appointment here (or you can bring the meter itself).  You can write it on any piece of paper.  please call us sooner if your blood sugar goes below 70, or if most of your readings are over 200. We are placing a continuous glucose monitor sensor today.  Please change the insulin to 20 units with breakfast, and 12 units with supper.  Please come back for a follow-up appointment in 2 weeks.

## 2020-12-07 ENCOUNTER — Ambulatory Visit: Payer: Medicare Other | Admitting: Diagnostic Neuroimaging

## 2020-12-10 ENCOUNTER — Other Ambulatory Visit: Payer: Self-pay

## 2020-12-10 ENCOUNTER — Ambulatory Visit (INDEPENDENT_AMBULATORY_CARE_PROVIDER_SITE_OTHER): Payer: Medicare Other | Admitting: Endocrinology

## 2020-12-10 VITALS — BP 118/80 | HR 55 | Ht 63.0 in | Wt 129.8 lb

## 2020-12-10 DIAGNOSIS — E119 Type 2 diabetes mellitus without complications: Secondary | ICD-10-CM

## 2020-12-10 DIAGNOSIS — Z794 Long term (current) use of insulin: Secondary | ICD-10-CM

## 2020-12-10 MED ORDER — INSULIN NPH ISOPHANE & REGULAR (70-30) 100 UNIT/ML ~~LOC~~ SUSP: SUBCUTANEOUS | Status: DC

## 2020-12-10 NOTE — Progress Notes (Signed)
Subjective:    Patient ID: [name], female    DOB: [birth year], 75 y.o.   MRN: [id number]  HPI Pt returns for f/u of diabetes mellitus: DM type: Insulin-requiring type 2 Dx'ed: 0865 Complications: DR, PN, and CRI Therapy: insulin since 2016 GDM: never DKA: never Severe hypoglycemia: never Pancreatitis: never Pancreatic imaging: normal on 2014 CT SDOH: none Other: she takes BID insulin, at least for now Interval history: continuous glucose monitor sensor fell off.  She says cbg varies from 129-390.  There is no trend throughout the day.  Pt says she never misses insulin doses.   Past Medical History:  Diagnosis Date   Breast cancer ([place])    breast - left    Chronic combined systolic and diastolic CHF (congestive heart failure) (HCC) -Systolic Dysfunction resolved.    a. cMRI 5/10: EF 38% // b. Echo 3/13/: EF 50-55, Gr 1 DD // c. Echo 8/14: EF 40, inf/inf-septal HK, Gr 1 DD, mild MR // d. Echo 5/17: EF 40-45, inf HK, Gr 1 DD, mild MR, mild LAE, reduced RVSF, mild TR, PASP 33 // e. Echo 10/17: EF 40-45, Gr 2 DD, mild MR, severe LAE   Coronary artery disease, non-occlusive    a. nonobs by LHC in 2010 // b. Myoview 10/17: Lg infarct apex, distal ant and lat walls, no ischemia, EF 38; int risk  // c. [place] 10/17:  dLAD 15, pLCx 61, mRCA 40, LVEDP 15   Diabetes mellitus    NICM (nonischemic cardiomyopathy) ([place])     Past Surgical History:  Procedure Laterality Date   APPENDECTOMY     BREAST LUMPECTOMY     lt breast   CARDIAC CATHETERIZATION N/A 12/20/2015   Procedure: Left Heart Cath and Coronary Angiography;  Surgeon: Nelva Bush, MD:: Mild to moderate, nonobstructive coronary artery disease, including 15% distal LAD stenosis, 30% ulcerated mid LCx disease, and 40% mid RCA stenosis.  Upper normal left ventricular filling pressure (LVEDP 15 mmHg).   RIGHT HEART CATH N/A 05/08/2016   Procedure: Right Heart Cath;  Surgeon: Jolaine Artist, MD;  [facility];; RA  = 3, RV = 34/5, PA = 38/12 (21), PCW = 12; Fick cardiac output/index = 4.6/2.6; PVR = 1.9 WU, Ao sat = 96%; PA sat = 58%, 62%    TRANSTHORACIC ECHOCARDIOGRAM  10/'17; 3/'18   a) EF 40 and 45%.  GRII DD; b) 04/2016: EF 20% with grade 2 diastolic dysfunction   TRANSTHORACIC ECHOCARDIOGRAM  10/2016   EF 55-60%.  Normal LV function.  Mild diastolic dysfunction.  Normal valve function   TRIGGER FINGER RELEASE Left 04/23/2017   Procedure: RELEASE TRIGGER FINGER TO LEFT FORTH FINGER;  Surgeon: Cristine Polio, MD;  Location: [facility];  Service: Plastics;  Laterality: Left;    Social History   Socioeconomic History   Marital status: Widowed    Spouse name: Not on file   Number of children: Not on file   Years of education: Not on file   Highest education level: Not on file  Occupational History   Not on file  Tobacco Use   Smoking status: Former    Years: 30.00    Types: Cigarettes    Quit date: 09/29/2012    Years since quitting: 8.2   Smokeless tobacco: Never  Substance and Sexual Activity   Alcohol use: No   Drug use: No   Sexual activity: Not on file  Other Topics Concern   Not on file  Social History Narrative   Not on file   Social Determinants of Health   Financial Resource Strain: Not on file  Food Insecurity: Not on file  Transportation Needs: Not on file  Physical Activity: Not on file  Stress: Not on file  Social Connections: Not on file  Intimate Partner Violence: Not on file    Current Outpatient Medications on File Prior to Visit  Medication Sig Dispense Refill   Aspirin-Caffeine (BACK & BODY EXTRA STRENGTH) 500-32.5 MG TABS Take 1 tablet by mouth every 6 (six) hours as needed (pain).     atorvastatin (LIPITOR) 40 MG tablet Take 1 tablet (40 mg total) by mouth daily.     carvedilol (COREG) 6.25 MG tablet Take 1 tablet (6.25 mg total) by mouth 2 (two) times daily. 180 tablet 3   furosemide (LASIX) 40 MG tablet MAY TAKE 1 TABLET BY MOUTH UP TO  TWICE A DAY AS NEEDED FOR SWELLING 180 tablet 0   sacubitril-valsartan (ENTRESTO) 49-51 MG Take 1 tablet by mouth 2 (two) times daily. 180 tablet 3   No current facility-administered medications on file prior to visit.    Allergies  Allergen Reactions   Tape Rash    Pt can tolerate the paper tape    Family History  Problem Relation Age of Onset   Stroke Father    Diabetes Maternal Grandmother     BP 118/80 (BP Location: Right Arm, Patient Position: Sitting, Cuff Size: Normal)   Pulse (!) 55   Ht 5\' 3"  (1.6 m)   Wt 129 lb 12.8 oz (58.9 kg)   SpO2 98%   BMI 22.99 kg/m    Review of Systems She denies hypoglycemia.      Objective:   Physical Exam   outside test results are reviewed: Creat=2.6    Assessment & Plan:  Insulin-requiring type 2 DM: uncontrolled.    Patient Instructions  check your blood sugar twice a day.  vary the time of day when you check, between before the 3 meals, and at bedtime.  also check if you have symptoms of your blood sugar being too high or too low.  please keep a record of the readings and bring it to your next appointment here (or you can bring the meter itself).  You can write it on any piece of paper.  please call us sooner if your blood sugar goes below 70, or if most of your readings are over 200.  Please change the insulin to 22 units with breakfast, and 12 units with supper.  The next step is to see the pattern of your blood sugar.   Please come back for a follow-up appointment in 1 month.  Please bring the meter, so we can download it.

## 2020-12-10 NOTE — Patient Instructions (Addendum)
check your blood sugar twice a day.  vary the time of day when you check, between before the 3 meals, and at bedtime.  also check if you have symptoms of your blood sugar being too high or too low.  please keep a record of the readings and bring it to your next appointment here (or you can bring the meter itself).  You can write it on any piece of paper.  please call us sooner if your blood sugar goes below 70, or if most of your readings are over 200.  Please change the insulin to 22 units with breakfast, and 12 units with supper.  The next step is to see the pattern of your blood sugar.   Please come back for a follow-up appointment in 1 month.  Please bring the meter, so we can download it.

## 2021-01-07 ENCOUNTER — Other Ambulatory Visit: Payer: Self-pay | Admitting: Cardiology

## 2021-01-07 ENCOUNTER — Ambulatory Visit (INDEPENDENT_AMBULATORY_CARE_PROVIDER_SITE_OTHER): Payer: Medicare Other | Admitting: Endocrinology

## 2021-01-07 ENCOUNTER — Other Ambulatory Visit: Payer: Self-pay

## 2021-01-07 VITALS — BP 128/68 | HR 75 | Ht 63.0 in | Wt 140.0 lb

## 2021-01-07 DIAGNOSIS — E119 Type 2 diabetes mellitus without complications: Secondary | ICD-10-CM

## 2021-01-07 DIAGNOSIS — Z794 Long term (current) use of insulin: Secondary | ICD-10-CM | POA: Diagnosis not present

## 2021-01-07 LAB — GLUCOSE, POCT (MANUAL RESULT ENTRY): POC Glucose: 200 mg/dl — AB (ref 70–99)

## 2021-01-07 LAB — POCT GLYCOSYLATED HEMOGLOBIN (HGB A1C): Hemoglobin A1C: 10.9 % — AB (ref 4.0–5.6)

## 2021-01-07 NOTE — Progress Notes (Signed)
Subjective:    Patient ID: [name], female    DOB: Jun 22, 1945, 75 y.o.   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   Interval history:  She says cbg varies from [place].  It is in general highest at HS. Pt says she never misses insulin doses.   Past Medical History:  Diagnosis Date   Breast cancer ([place])    breast - left    Chronic combined systolic and diastolic CHF (congestive heart failure) (HCC) -Systolic Dysfunction resolved.    a. cMRI 5/10: EF 38% // b. Echo 3/13/: EF 50-55, Gr 1 DD // c. Echo 8/14: EF 40, inf/inf-septal HK, Gr 1 DD, mild MR // d. Echo 5/17: EF 40-45, inf HK, Gr 1 DD, mild MR, mild LAE, reduced RVSF, mild TR, PASP 33 // e. Echo 10/17: EF 40-45, Gr 2 DD, mild MR, severe LAE   Coronary artery disease, non-occlusive    a. nonobs by LHC in 2010 // b. Myoview 10/17: Lg infarct apex, distal ant and lat walls, no ischemia, EF 38; int risk  // c. [place] 10/17:  dLAD 15, pLCx 93, mRCA 40, LVEDP 15   Diabetes mellitus    NICM (nonischemic cardiomyopathy) ([place])     Past Surgical History:  Procedure Laterality Date   APPENDECTOMY     BREAST LUMPECTOMY     lt breast   CARDIAC CATHETERIZATION N/A 12/20/2015   Procedure: Left Heart Cath and Coronary Angiography;  Surgeon: Nelva Bush, MD:: Mild to moderate, nonobstructive coronary artery disease, including 15% distal LAD stenosis, 30% ulcerated mid LCx disease, and 40% mid RCA stenosis.  Upper normal left ventricular filling pressure (LVEDP 15 mmHg).   RIGHT HEART CATH N/A 05/08/2016   Procedure: Right Heart Cath;  Surgeon: Jolaine Artist, MD;  [facility];; RA = 3, RV = 34/5, PA = 38/12 (21), PCW = 12; Fick cardiac output/index = 4.6/2.6; PVR = 1.9 WU, Ao sat = 96%; PA sat = 58%, 62%    TRANSTHORACIC ECHOCARDIOGRAM  10/'17; 3/'18   a) EF 40 and 45%.  GRII DD; b) 04/2016: EF 20% with grade 2 diastolic dysfunction   TRANSTHORACIC ECHOCARDIOGRAM  10/2016   EF 55-60%.  Normal LV function.  Mild diastolic dysfunction.  Normal valve function   TRIGGER FINGER RELEASE Left 04/23/2017   Procedure: RELEASE TRIGGER FINGER TO LEFT FORTH FINGER;  Surgeon: Cristine Polio, MD;  Location: [facility];  Service: Plastics;  Laterality: Left;    Social History   Socioeconomic History   Marital status: Widowed    Spouse name: Not on file   Number of children: Not on file   Years of education: Not on file   Highest education level: Not on file  Occupational History   Not on file  Tobacco Use   Smoking status: Former    Years: 30.00    Types: Cigarettes    Quit date: 09/29/2012    Years since quitting: 8.2   Smokeless tobacco: Never  Substance and Sexual Activity   Alcohol use: No   Drug use: No   Sexual activity: Not on file  Other  Topics Concern   Not on file  Social History Narrative   Not on file   Social Determinants of Health   Financial Resource Strain: Not on file  Food Insecurity: Not on file  Transportation Needs: Not on file  Physical Activity: Not on file  Stress: Not on file  Social Connections: Not on file  Intimate Partner Violence: Not on file    Current Outpatient Medications on File Prior to Visit  Medication Sig Dispense Refill   Aspirin-Caffeine (BACK & BODY EXTRA STRENGTH) 500-32.5 MG TABS Take 1 tablet by mouth every 6 (six) hours as needed (pain).     atorvastatin (LIPITOR) 40 MG tablet Take 1 tablet (40 mg total) by mouth daily.     carvedilol (COREG) 6.25 MG tablet Take 1 tablet (6.25 mg total) by mouth 2 (two) times daily. 180 tablet 3   insulin NPH-regular Human (70-30) 100  UNIT/ML injection 22 units with breakfast, and 12 units with supper. 10 mL    sacubitril-valsartan (ENTRESTO) 49-51 MG Take 1 tablet by mouth 2 (two) times daily. 180 tablet 3   No current facility-administered medications on file prior to visit.    Allergies  Allergen Reactions   Tape Rash    Pt can tolerate the paper tape    Family History  Problem Relation Age of Onset   Stroke Father    Diabetes Maternal Grandmother     BP 128/68 (BP Location: Left Arm, Patient Position: Sitting, Cuff Size: Normal)   Pulse 75   Ht 5\' 3"  (1.6 m)   Wt 140 lb (63.5 kg)   SpO2 100%   BMI 24.80 kg/m    Review of Systems She seldom has hypoglycemia, and these episodes are mild.  This happens after lunch is missed    Objective:   Physical Exam    Lab Results  Component Value Date   HGBA1C 10.9 (A) 01/07/2021      Assessment & Plan:  Insulin-requiring type 2 DM: uncontrolled.   Patient Instructions  check your blood sugar twice a day.  vary the time of day when you check, between before the 3 meals, and at bedtime.  also check if you have symptoms of your blood sugar being too high or too low.  please keep a record of the readings and bring it to your next appointment here (or you can bring the meter itself).  You can write it on any piece of paper.  please call us sooner if your blood sugar goes below 70, or if most of your readings are over 200.  Please continue the same insulin for now.   We are doing the patient assistance form for Ozempic. When you start this, please reduce the insulin to 16 units with breakfast, and 8 units with supper.   Please come back for a follow-up appointment in 2 months.

## 2021-01-07 NOTE — Patient Instructions (Addendum)
check your blood sugar twice a day.  vary the time of day when you check, between before the 3 meals, and at bedtime.  also check if you have symptoms of your blood sugar being too high or too low.  please keep a record of the readings and bring it to your next appointment here (or you can bring the meter itself).  You can write it on any piece of paper.  please call us sooner if your blood sugar goes below 70, or if most of your readings are over 200.  Please continue the same insulin for now.   We are doing the patient assistance form for Ozempic. When you start this, please reduce the insulin to 16 units with breakfast, and 8 units with supper.   Please come back for a follow-up appointment in 2 months.

## 2021-01-17 ENCOUNTER — Ambulatory Visit (INDEPENDENT_AMBULATORY_CARE_PROVIDER_SITE_OTHER): Payer: Medicare Other | Admitting: Cardiology

## 2021-01-17 ENCOUNTER — Encounter: Payer: Self-pay | Admitting: Cardiology

## 2021-01-17 ENCOUNTER — Other Ambulatory Visit: Payer: Self-pay

## 2021-01-17 VITALS — BP 132/62 | HR 72 | Ht 63.0 in | Wt 143.6 lb

## 2021-01-17 DIAGNOSIS — E785 Hyperlipidemia, unspecified: Secondary | ICD-10-CM

## 2021-01-17 DIAGNOSIS — I5032 Chronic diastolic (congestive) heart failure: Secondary | ICD-10-CM | POA: Diagnosis not present

## 2021-01-17 DIAGNOSIS — N1832 Chronic kidney disease, stage 3b: Secondary | ICD-10-CM

## 2021-01-17 DIAGNOSIS — I428 Other cardiomyopathies: Secondary | ICD-10-CM

## 2021-01-17 DIAGNOSIS — E1169 Type 2 diabetes mellitus with other specified complication: Secondary | ICD-10-CM | POA: Diagnosis not present

## 2021-01-17 DIAGNOSIS — R002 Palpitations: Secondary | ICD-10-CM | POA: Diagnosis not present

## 2021-01-17 NOTE — Progress Notes (Signed)
Primary Care Provider: Stephani Police, MD Cardiologist: Glenetta Hew, MD Electrophysiologist: None  Clinic Note: Chief Complaint  Patient presents with   Follow-up    Delayed annual/hospital follow-up.  Did not come in August because of hospitalization.   Congestive Heart Failure    Chronic mild diastolic heart failure with EF stable at 50 to 55%. ->  Taking extra Lasix 40 mg once twice.    ===================================  ASSESSMENT/PLAN   Problem List Items Addressed This Visit       Cardiology Problems   Chronic diastolic heart failure (HCC) - Primary (Chronic)    Pretty much NYHA Class II at most CHF symptoms because of recent hospitalization.  Blood pressure relatively stable as is heart rate on current dose of Entresto and carvedilol.  She is taking a standing dose of furosemide.    Renal function has gotten a little worse with creatinine now more in the 1.3-1.5-1.6 range.  Intermittently holding Lasix may be reasonable, but this cannot be the only treatment.  She needs to be on her standing dose.  We discussed sliding scale dosing for weight gain and edema as well as orthopnea/PND.  Also needs to watch out during the holiday season.  -> Will defer to endocrinology, but may want to consider SGLT2 inhibitor as opposed to GLP-1 agonist in addition to insulin.  This would help out with some diuretic effect.      Hyperlipidemia associated with type 2 diabetes mellitus ([place]) (Chronic)    Labs actually look pretty well controlled back in July on current dose of atorvastatin.  Tolerating well with no issues.  LDL is improved down to 67 from previous evaluation of 100.  Plan: Continue atorvastatin. Follow-up labs with PCP/endocrinology.      Relevant Orders   EKG 12-Lead (Completed)   NICM (nonischemic cardiomyopathy) (HCC) (Chronic)    Resolved with EF now stable at 50-55%.  She still has diastolic dysfunction residual back.  Plan: Continue current dose of  Entresto and carvedilol along with standing dose furosemide plus sliding scale Lasix..  Endocrinology is considering Ozempic--;> would also suggest considering SGLT2 inhibitor for added diuretic effect, and renal dysfunction.      Relevant Orders   EKG 12-Lead (Completed)     Other   CKD (chronic kidney disease), stage III (HCC) (Chronic)    Creatinine has slowly gone up.  Most recent creatinine was 1.6, but have been more in the 1.3-1.4 range before.  Relatively stable.  I do not think she is over diuresed or dehydrated.  Continue current medications.  No change for now. Consider SGLT 2 inhibitor.      Palpitations (Chronic)    Remains asymptomatic PVCs.  Continue current dose carvedilol-with concerns of renal function, would hold off on up titration       ===================================  HPI:    [name] is a 75 y.o. female with a PMH notable for Resolved Nonischemic Cardiomyopathy now with Chronic HFpEF (EF improved to 50 to 55%), CKD 3B, DM-2, HTN, HLD who presents today for delayed annual follow-up/hospital follow-up.    11/2015: Admitted for A/C combined CHF  Echo: EF 40 to 45%.   Cath:  Mild to moderate nonobstructive disease.  Borderline elevated LVEDP. 04/25/2016: Admitted again for [facility] combined CHF March 2018 Echo with a EF of 20%, GRII DD. -->  Aggressively diuresed RHC done upon completion of diuresis showed well compensated numbers. Thought to be either Takotsubo related versus frequent PVC related. Most recent Echocardiogram September  2018: EF back to 55-60%.  GR 1 DD  Transition from CHF clinic to General Cardiology in February 2019 -> is on Entresto, carvedilol, spironolactone along with as needed Lasix.   [name] was last seen on 09/22/2019 -> was dealing with lots of issues about her best friend's husband having passed away.  Also having issues with macular degeneration diagnosis.  Doing well from a cardiac standpoint.  No CHF symptoms  Recent  Hospitalizations:  September 20, 2020-acute on chronic CHF.  Shortness of breath and leg swelling.  Admitted to dietary indiscretion/medical nonadherence. => She had reduced her Lasix to 20 mg daily (concern for possible renal dysfunction with dehydration).  And also had issues with hypoglycemia with increased insulin dosing. Increased back up to 40 mg Lasix daily.Marland Kitchen  She was finally able to be seen by Dr. Loanne Drilling from endocrinology after my original referral for consultation.  Endocrinologist is working on getting her started on Ozempic.  Reviewed  CV studies:    The following studies were reviewed today: (if available, images/films reviewed: From Epic Chart or Care Everywhere) Echo August 2022: EF 50 to 55%.  Normal function.  No R WMA.  Mild LVH.  GR 2 DD with elevated LAP.-Mild LA dilation.  Moderately elevated PAP estimated RVSP 47 mmHg, and RAP 8 mmHg.  Interval History:   [name] presents here today stating that she is actually doing okay overall cardiac standpoint.  She had a slight setback back in August, but is otherwise been doing pretty well.  This revolved around her renal function being taken as a question and she backed off on the diuretic.  For the most part, she said that during the [place] timeframe, she stopped working stopping very active.  She is becoming quite sedentary and has allowed herself to decline little bit.  She is now try to make a concerted effort to try and increase level of activity.  She says that her energy level and stamina have started to bounce back up again now.  Since August, she has been staying on stable dose of Lasix IV 40 mg daily and on average twice a week will take an extra dose.  She is not currently noticing any PND orthopnea but still has little edema.  She denies any chest pain or pressure with rest exertion no arrhythmia symptoms.  CV Review of Symptoms (Summary) Cardiovascular ROS: positive for - -baseline exertional dyspnea and mild  edema. negative for - chest pain, irregular heartbeat, orthopnea, palpitations, paroxysmal nocturnal dyspnea, rapid heart rate, shortness of breath, or lightheadedness, dizziness or wooziness, syncope/near syncope or TIA/amaurosis fugax, claudication  REVIEWED OF SYSTEMS   Review of Systems  Constitutional:  Positive for malaise/fatigue (Has become sedentary.  Try to get back into being more active.). Negative for weight loss (Put on about 15 pounds since last year.).  HENT:  Negative for congestion and nosebleeds.   Eyes:        Still having issues with macular degeneration.  Respiratory:  Negative for cough and shortness of breath (Baseline exertional dyspnea.).   Cardiovascular:  Negative for claudication.       Per HPI  Gastrointestinal:  Negative for blood in stool and melena.  Genitourinary:  Negative for hematuria.  Musculoskeletal:  Negative for falls and joint pain.  Neurological:  Negative for dizziness and focal weakness.  Psychiatric/Behavioral: Negative.     I have reviewed and (if needed) personally updated the patient's problem list, medications, allergies, past medical and surgical  history, social and family history.   PAST MEDICAL HISTORY   Past Medical History:  Diagnosis Date   Breast cancer ([place])    breast - left    Chronic diastolic CHF (congestive heart failure), NYHA class 2 ([place])    a. cMRI 5/10: EF 38% // b. Echo 3/13/: EF 50-55, Gr 1 DD // c. Echo 8/14: EF 40, inf/inf-septal HK, Gr 1 DD, mild MR // d. Echo 5/17: EF 40-45, inf HK, Gr 1 DD, mild MR, mild LAE, reduced RVSF, mild TR, PASP 33 // e. Echo 10/17: EF 40-45, Gr 2 DD, mild MR, severe LAE; => 09/2020 EF 50-55%, GrII DD.   Coronary artery disease, non-occlusive    a. nonobs by LHC in 2010 // b. Myoview 10/17: Lg infarct apex, distal ant and lat walls, no ischemia, EF 38; int risk  // c. [place] 10/17:  dLAD 15, pLCx 73, mRCA 40, LVEDP 15   Diabetes mellitus    NICM (nonischemic cardiomyopathy) ([place])      PAST SURGICAL HISTORY   Past Surgical History:  Procedure Laterality Date   APPENDECTOMY     BREAST LUMPECTOMY     lt breast   CARDIAC CATHETERIZATION N/A 12/20/2015   Procedure: Left Heart Cath and Coronary Angiography;  Surgeon: Nelva Bush, MD:: Mild to moderate, nonobstructive coronary artery disease, including 15% distal LAD stenosis, 30% ulcerated mid LCx disease, and 40% mid RCA stenosis.  Upper normal left ventricular filling pressure (LVEDP 15 mmHg).   RIGHT HEART CATH N/A 05/08/2016   Procedure: Right Heart Cath;  Surgeon: Jolaine Artist, MD;  Location: [place] [facility];; RA = 3, RV = 34/5, PA = 38/12 (21), PCW = 12; Fick cardiac output/index = 4.6/2.6; PVR = 1.9 WU, Ao sat = 96%; PA sat = 58%, 62%    TRANSTHORACIC ECHOCARDIOGRAM  11/2015   a) 11/2015: EF 40 and 45%.  GRII DD; b) 04/2016: EF 20% with grade 2 diastolic dysfunction   TRANSTHORACIC ECHOCARDIOGRAM  10/2016   a) EF 55-60%.  Normal LV function.  Mild DD.Marland Kitchen  Normal valve Fxn.; b) 09/2020: EF 50 to 55%.  Normal function.  No R WMA.  Mild LVH.  GR 2 DD with elevated LAP.-Mild LA dilation.  Moderately elevated PAP estimated RVSP 47 mmHg, and RAP 8 mmHg.   TRIGGER FINGER RELEASE Left 04/23/2017   Procedure: RELEASE TRIGGER FINGER TO LEFT FORTH FINGER;  Surgeon: Cristine Polio, MD;  Location: [facility];  Service: Plastics;  Laterality: Left;    Immunization History  Administered Date(s) Administered   Influenza,inj,Quad PF,6+ Mos 05/06/2016   Moderna Sars-Covid-2 Vaccination 05/15/2019, 06/17/2019   Pneumococcal Polysaccharide-23 05/07/2016    MEDICATIONS/ALLERGIES   Current Meds  Medication Sig   Aspirin-Caffeine (BACK & BODY EXTRA STRENGTH) 500-32.5 MG TABS Take 1 tablet by mouth every 6 (six) hours as needed (pain).   insulin NPH-regular Human (70-30) 100 UNIT/ML injection 22 units with breakfast, and 12 units with supper.   sacubitril-valsartan (ENTRESTO) 49-51 MG Take 1 tablet by  mouth 2 (two) times daily.   []  atorvastatin (LIPITOR) 40 MG tablet Take 1 tablet (40 mg total) by mouth daily.   []  carvedilol (COREG) 6.25 MG tablet Take 1 tablet (6.25 mg total) by mouth 2 (two) times daily.   []  furosemide (LASIX) 40 MG tablet Take 40 mg by mouth daily. May take an additional 40 mg tablet  if in weight gain of 3 lbs overnight and /or increase shortness of breath  Allergies  Allergen Reactions   Tape Rash    Pt can tolerate the paper tape    SOCIAL HISTORY/FAMILY HISTORY   Reviewed in Epic:  Pertinent findings:  Social History   Tobacco Use   Smoking status: Former    Years: 30.00    Types: Cigarettes    Quit date: 09/29/2012    Years since quitting: 8.3   Smokeless tobacco: Never  Substance Use Topics   Alcohol use: No   Drug use: No   Social History   Social History Narrative   Not on file    OBJCTIVE -PE, EKG, labs   Wt Readings from Last 3 Encounters:  01/17/21 143 lb 9.6 oz (65.1 kg)  01/07/21 140 lb (63.5 kg)  12/10/20 129 lb 12.8 oz (58.9 kg)    Physical Exam: BP 132/62   Pulse 72   Ht 5\' 3"  (1.6 m)   Wt 143 lb 9.6 oz (65.1 kg)   SpO2 98%   BMI 25.44 kg/m  Physical Exam Vitals reviewed.  Constitutional:      General: She is not in acute distress.    Appearance: Normal appearance. She is normal weight. She is not ill-appearing or toxic-appearing.     Comments: Well-nourished, healthy-appearing.  Well-groomed  HENT:     Head: Normocephalic and atraumatic.  Neck:     Vascular: No carotid bruit or JVD.  Cardiovascular:     Rate and Rhythm: Normal rate and regular rhythm. No extrasystoles are present.    Chest Wall: PMI is not displaced.     Pulses: Normal pulses.     Heart sounds: S1 normal and S2 normal. No murmur heard.   No friction rub. No gallop.  Pulmonary:     Effort: Pulmonary effort is normal. No respiratory distress.     Breath sounds: Normal breath sounds.  Musculoskeletal:        General: No swelling. Normal  range of motion.     Cervical back: Normal range of motion and neck supple.  Skin:    General: Skin is warm and dry.  Neurological:     General: No focal deficit present.     Mental Status: She is alert and oriented to person, place, and time.     Gait: Gait normal.  Psychiatric:        Mood and Affect: Mood normal.        Behavior: Behavior normal.        Thought Content: Thought content normal.        Judgment: Judgment normal.     Adult ECG Report  Rate: 72;  Rhythm: normal sinus rhythm, premature ventricular contractions (PVC), and next axis deviation.  Septal MI, age-indeterminate.  Nonspecific T wave inversions consider lateral ischemia. ;   Narrative Interpretation: Stable EKG.  Recent Labs:   08/27/2020: TC 139, TG 92, HDL 55, LDL 67. 12/24/2020: Cr 1.61, K+ 4.6.  Lab Results  Component Value Date   CREATININE 1.37 (H) 09/23/2020   BUN 36 (H) 09/23/2020   NA 135 09/23/2020   K 3.7 09/23/2020   CL 97 (L) 09/23/2020   CO2 28 09/23/2020   CBC Latest Ref Rng & Units 09/20/2020 09/20/2020 05/15/2017  WBC 4.0 - 10.5 K/uL - 7.4 9.2  Hemoglobin 12.0 - 15.0 g/dL 10.2(L) 10.4(L) 12.1  Hematocrit 36.0 - 46.0 % 30.0(L) 30.5(L) 35.1(L)  Platelets 150 - 400 K/uL - 243 277    Lab Results  Component Value Date   HGBA1C 10.9 (A) 01/07/2021  Lab Results  Component Value Date   TSH 1.268 03/17/2016    ==================================================  COVID-19 Education: The signs and symptoms of COVID-19 were discussed with the patient and how to seek care for testing (follow up with PCP or arrange E-visit).    I spent a total of 13 minutes with the patient spent in direct patient consultation.  Additional time spent with chart review  / charting (studies, outside notes, etc): 15 min Total Time: 28 min  Current medicines are reviewed at length with the patient today.  (+/- concerns) none  This visit occurred during the SARS-CoV-2 public health emergency.  Safety protocols  were in place, including screening questions prior to the visit, additional usage of staff PPE, and extensive cleaning of exam room while observing appropriate contact time as indicated for disinfecting solutions.  Notice: This dictation was prepared with Dragon dictation along with smart phrase technology. Any transcriptional errors that result from this process are unintentional and may not be corrected upon review.  Studies Ordered:   Orders Placed This Encounter  Procedures   EKG 12-Lead    Patient Instructions / Medication Changes & Studies & Tests Ordered   Patient Instructions  Medication Instructions:    Continue with taking Furosemide 40 mg  daily  ,if needed may take an additional 40 mg daily ,if you gain weight of 3 lbs overnight.    *If you need a refill on your cardiac medications before your next appointment, please call your pharmacy*   Lab Work:  Not needed   Testing/Procedures:  Not needed  Follow-Up: At [facility], you and your health needs are our priority.  As part of our continuing mission to provide you with exceptional heart care, we have created designated Provider Care Teams.  These Care Teams include your primary Cardiologist (physician) and Advanced Practice Providers (APPs -  Physician Assistants and Nurse Practitioners) who all work together to provide you with the care you need, when you need it.     Your next appointment:   6 month(s)  The format for your next appointment:   In Person  Provider:      Jory Sims DNP, Caron Presume PA   Your physician recommends that you schedule a follow-up appointment in: 51 months Glenetta Hew, MD    Other Instructions      Glenetta Hew, M.D., M.S. Interventional Cardiologist   Pager # [phone] Phone # [phone] [address] [place], [place] [address]   Thank you for choosing Heartcare at [facility]!!

## 2021-01-17 NOTE — Patient Instructions (Addendum)
Medication Instructions:    Continue with taking Furosemide 40 mg  daily  ,if needed may take an additional 40 mg daily ,if you gain weight of 3 lbs overnight.    *If you need a refill on your cardiac medications before your next appointment, please call your pharmacy*   Lab Work:  Not needed   Testing/Procedures:  Not needed  Follow-Up: At [facility], you and your health needs are our priority.  As part of our continuing mission to provide you with exceptional heart care, we have created designated Provider Care Teams.  These Care Teams include your primary Cardiologist (physician) and Advanced Practice Providers (APPs -  Physician Assistants and Nurse Practitioners) who all work together to provide you with the care you need, when you need it.     Your next appointment:   6 month(s)  The format for your next appointment:   In Person  Provider:      Jory Sims DNP, Caron Presume PA   Your physician recommends that you schedule a follow-up appointment in: 30 months Glenetta Hew, MD    Other Instructions

## 2021-01-24 ENCOUNTER — Telehealth: Payer: Self-pay | Admitting: Cardiology

## 2021-01-24 ENCOUNTER — Other Ambulatory Visit: Payer: Self-pay

## 2021-01-24 ENCOUNTER — Ambulatory Visit: Payer: Medicare Other | Admitting: Cardiology

## 2021-01-24 MED ORDER — CARVEDILOL 6.25 MG PO TABS: 6.2500 mg | ORAL_TABLET | Freq: Two times a day (BID) | ORAL | 3 refills | Status: DC

## 2021-01-24 MED ORDER — FUROSEMIDE 40 MG PO TABS: 40.0000 mg | ORAL_TABLET | Freq: Every day | ORAL | 1 refills | Status: DC

## 2021-01-24 MED ORDER — ATORVASTATIN CALCIUM 40 MG PO TABS: 40.0000 mg | ORAL_TABLET | Freq: Every day | ORAL | Status: DC

## 2021-01-24 NOTE — Telephone Encounter (Signed)
Patient called to see if the forms has been filled out that she drop off. Please advise

## 2021-01-24 NOTE — Telephone Encounter (Signed)
*  STAT* If patient is at the pharmacy, call can be transferred to refill team.   1. Which medications need to be refilled? (please list name of each medication and dose if known) atorvastatin (LIPITOR) 40 MG tablet  carvedilol (COREG) 6.25 MG tablet  furosemide (LASIX) 40 MG tablet  2. Which pharmacy/location (including street and city if local pharmacy) is medication to be sent to? [place], [place]  3. Do they need a 30 day or 90 day supply? [place]

## 2021-01-24 NOTE — Telephone Encounter (Signed)
Spoke to patient . RN informed patient patient assistance form  faxed last week for Entresto 49/51 Patient verbalized understanding

## 2021-02-01 ENCOUNTER — Encounter: Payer: Self-pay | Admitting: Cardiology

## 2021-02-01 NOTE — Assessment & Plan Note (Signed)
Remains asymptomatic PVCs.  Continue current dose carvedilol-with concerns of renal function, would hold off on up titration

## 2021-02-01 NOTE — Assessment & Plan Note (Signed)
Creatinine has slowly gone up.  Most recent creatinine was 1.6, but have been more in the 1.3-1.4 range before.  Relatively stable.  I do not think she is over diuresed or dehydrated.  Continue current medications.  No change for now. Consider SGLT 2 inhibitor.

## 2021-02-01 NOTE — Assessment & Plan Note (Addendum)
Pretty much NYHA Class II at most CHF symptoms because of recent hospitalization.  Blood pressure relatively stable as is heart rate on current dose of Entresto and carvedilol.  She is taking a standing dose of furosemide.    Renal function has gotten a little worse with creatinine now more in the 1.3-1.5-1.6 range.  Intermittently holding Lasix may be reasonable, but this cannot be the only treatment.  She needs to be on her standing dose.  We discussed sliding scale dosing for weight gain and edema as well as orthopnea/PND.  Also needs to watch out during the holiday season.  -> Will defer to endocrinology, but may want to consider SGLT2 inhibitor as opposed to GLP-1 agonist in addition to insulin.  This would help out with some diuretic effect.

## 2021-02-01 NOTE — Assessment & Plan Note (Signed)
Labs actually look pretty well controlled back in July on current dose of atorvastatin.  Tolerating well with no issues.  LDL is improved down to 67 from previous evaluation of 100.  Plan: Continue atorvastatin. Follow-up labs with PCP/endocrinology.

## 2021-02-01 NOTE — Assessment & Plan Note (Signed)
Resolved with EF now stable at 50-55%.  She still has diastolic dysfunction residual back.  Plan: Continue current dose of Entresto and carvedilol along with standing dose furosemide plus sliding scale Lasix..  Endocrinology is considering Ozempic--;> would also suggest considering SGLT2 inhibitor for added diuretic effect, and renal dysfunction.

## 2021-02-28 ENCOUNTER — Telehealth: Payer: Self-pay | Admitting: Cardiology

## 2021-02-28 NOTE — Telephone Encounter (Signed)
Spoke with daughter Sharyn Lull who stated patient is running out of entresto. A FAX was sent to the company on 12/5 by Dr. Allison Quarry nurse. Patient has one week's worth of medication and will need samples. He daughter Sharyn Lull can pick them up tomorrow if samples are available.

## 2021-02-28 NOTE — Telephone Encounter (Signed)
Patient is having trouble getting her medication Entresto.  She stated she called company and they said they didn't get the paperwork.  She is just about out of medication.

## 2021-03-01 MED ORDER — ENTRESTO 49-51 MG PO TABS: 1.0000 | ORAL_TABLET | Freq: Two times a day (BID) | ORAL | 0 refills | Status: DC

## 2021-03-01 NOTE — Telephone Encounter (Signed)
2 week sample left at front desk, will forward to Cherene Altes, RN who will check with manufacturer tomorrow about patient assistance paperwork

## 2021-03-02 MED ORDER — ENTRESTO 49-51 MG PO TABS: 1.0000 | ORAL_TABLET | Freq: Two times a day (BID) | ORAL | 3 refills | Status: DC

## 2021-03-02 NOTE — Telephone Encounter (Signed)
RN called [employer] patient assistance foundation  - to inquire  Medication Entresto 49/51 mg . Original  information was faxed 01/20/21   Spoke to representative Rica Mote.  He reviewed  he states he did  not see documentation from dec 2023 . Last information from 2022.  Advise to resend information. He states it maybe early art of feb 2023 for confirmation.  RN will contact  patient and daughter.

## 2021-03-02 NOTE — Telephone Encounter (Signed)
Spoke with daughter Sharyn Lull. Informed her form will need to be refaxed  It may take up ti 4 to 6 weeks for an approval . Call office when patient has  aweeks worth of Entresto. Michelle stays she has picked up sample from office today already. She verbalized understanding.

## 2021-03-11 ENCOUNTER — Ambulatory Visit: Payer: Medicare Other | Admitting: Endocrinology

## 2021-04-07 ENCOUNTER — Other Ambulatory Visit: Payer: Self-pay | Admitting: Cardiology

## 2021-07-05 ENCOUNTER — Other Ambulatory Visit: Payer: Self-pay | Admitting: Cardiology

## 2021-08-03 NOTE — Progress Notes (Unsigned)
Cardiology Office Note:    Date:  08/03/2021   ID:  [name], DOB 04-08-1945, MRN [id number]  PCP:  Stephani Police, MD  Cardiologist:  Glenetta Hew, MD  Electrophysiologist:  None   Referring MD: Stephani Police, MD   Chief Complaint: routine follow-up of CHF   History of Present Illness:    [name] is a 76 y.o. female with a history of non-obstructive CAD noted on cardiac catheterization in 11/2015, non-ischemic cardiomyopathy/ chronic combined CHF with EF as low as 20% in the past but improved to 50-55% on last Echo in 09/2020, palpitations with brief runs of SVT and frequent PVCs/PAC on monitor in 05/2016, temote DVT, hypertension, hyperlipidemia, type 2 diabetes mellitus, CKD stage IIIb, remote breast cancer s/p lumpectomy and radiation as well as chemotherapy (Tamoxifen), and tobacco abuse who is followed by Dr. Ellyn Hack and presents today for routine follow-up of CHF.  Patient has a long history of non-ischemic cardiomyopathy. Remote cardiac MRI in 06/2008 showed moderate LV systolic dysfunction with EF of 38% (inferior and and inferolateral wall appeared segmentallly worse) as well as a very small area of mid-wall delayed enhancement in the inferolateral base which is a non-specific finding but can be seen with infiltrative diseases such as sarcoid as well as with myocarditis. He has had multiple hospitalizations for acute on chronic CHF over the years. During one of these admissions in 11/2015, Echo showed LVEF of 40-45%. He underwent LHC at that time which showed mild to moderate non-obstructive CAD with 15% stenosis of distal LAD, 30% ulcerated mid LCX disease, and 40% stenosis of mid RCA. He was admitted with acute on chronic CHF in 04/2016 and Echo showed a drop in EF to 20% with diffuse hypokinesis and grade 2 diastolic dysfunction as well as mildly reduced RV systolic function. RHC was performed and showed compensated filling pressures. There was concern that frequent PVCs may be  the cause for low EF or possible stress-induced cardiomyopathy. Monitor in 05/2016 showed normal sinus rhythm with occasional brief runs of SVT and frequent PVCs and PACs but PVC burden was down to 5.8%. EF had normalized on repeat Echo in 10/2016. He was last admitted with acute on chronic CHF in 09/2020 due to dietary indiscretion and medical non-adherence. Echo at that time showed LVEF of 50-55% with normal wall motion, mild LVH, and grade 2 diastolic dysfunction as well as moderately elevated RVSP of 46.7 mmHg. RV was normal.   Patient was last seen by Dr. Ellyn Hack in 12/2020 at which time she was doing well overall from a cardiac standpoint. She had baseline exertional dyspnea but this was stable.  Patient presents today for follow-up. ***  Non-Ischemic Cardiomyopathy Chronic Combined CHF LVEF as low as 20% in 2018 but has since normalized. Most recent Echo in 09/2020 showed LVEF of 50-55% with normal wall motion, mild LVH, and grade 2 diastolic dysfunction as well as moderately elevated RVSP of 46.7 mmHg. RV was normal.  - Euvolemic on exam.  - Continue Lasix '40mg'$  *** CONFIRM DOSE *** - Continue Entresto 49-'51mg'$  twice daily. - Continue Coreg 6.'25mg'$  twice daily. - Continue daily weights and sodium/fluid restrictions.   Non-Obstructive CAD Noted on LHC in 11/2015.  - No chest pain.  - Recommended starting Aspirin '81mg'$  daily given give mild to moderate disease on cath. *** - Continue Lipitor '40mg'$  daily.   Palpitations Frequent PVCs Monitor in 2018 showed occasional brief runs of SVT as well as frequent PVCs and PACs.  - Stable  and asymptomatic. *** - Continue Coreg 6.'25mg'$  twice daily.  Hypertension BP well controlled. - Continue medications for CHF as above.  Hyperlipidemia Most recent lipid panel in *** - Continue Lipitor '40mg'$  daily.   Type 2 Diabetes Mellitus - Uncontrolled Hemoglobin A1c 10.9% in 12/2020.  - Management per Endocrinologist (Dr. Loanne Drilling).  CKD Stage  III Baseline creatinine around 1.2 to 1.4.  - ***  Past Medical History:  Diagnosis Date   Breast cancer ([place])    breast - left    Chronic diastolic CHF (congestive heart failure), NYHA class 2 ([place])    a. cMRI 5/10: EF 38% // b. Echo 3/13/: EF 50-55, Gr 1 DD // c. Echo 8/14: EF 40, inf/inf-septal HK, Gr 1 DD, mild MR // d. Echo 5/17: EF 40-45, inf HK, Gr 1 DD, mild MR, mild LAE, reduced RVSF, mild TR, PASP 33 // e. Echo 10/17: EF 40-45, Gr 2 DD, mild MR, severe LAE; => 09/2020 EF 50-55%, GrII DD.   Coronary artery disease, non-occlusive    a. nonobs by LHC in 2010 // b. Myoview 10/17: Lg infarct apex, distal ant and lat walls, no ischemia, EF 38; int risk  // c. [place] 10/17:  dLAD 15, pLCx 18, mRCA 40, LVEDP 15   Diabetes mellitus    NICM (nonischemic cardiomyopathy) ([place])     Past Surgical History:  Procedure Laterality Date   APPENDECTOMY     BREAST LUMPECTOMY     lt breast   CARDIAC CATHETERIZATION N/A 12/20/2015   Procedure: Left Heart Cath and Coronary Angiography;  Surgeon: Nelva Bush, MD:: Mild to moderate, nonobstructive coronary artery disease, including 15% distal LAD stenosis, 30% ulcerated mid LCx disease, and 40% mid RCA stenosis.  Upper normal left ventricular filling pressure (LVEDP 15 mmHg).   RIGHT HEART CATH N/A 05/08/2016   Procedure: Right Heart Cath;  Surgeon: Jolaine Artist, MD;  Location: [facility];; RA = 3, RV = 34/5, PA = 38/12 (21), PCW = 12; Fick cardiac output/index = 4.6/2.6; PVR = 1.9 WU, Ao sat = 96%; PA sat = 58%, 62%    TRANSTHORACIC ECHOCARDIOGRAM  11/2015   a) 11/2015: EF 40 and 45%.  GRII DD; b) 04/2016: EF 20% with grade 2 diastolic dysfunction   TRANSTHORACIC ECHOCARDIOGRAM  10/2016   a) EF 55-60%.  Normal LV function.  Mild DD.Marland Kitchen  Normal valve Fxn.; b) 09/2020: EF 50 to 55%.  Normal function.  No R WMA.  Mild LVH.  GR 2 DD with elevated LAP.-Mild LA dilation.  Moderately elevated PAP estimated RVSP 47 mmHg, and RAP 8 mmHg.   TRIGGER  FINGER RELEASE Left 04/23/2017   Procedure: RELEASE TRIGGER FINGER TO LEFT FORTH FINGER;  Surgeon: Cristine Polio, MD;  Location: [facility];  Service: Plastics;  Laterality: Left;    Current Medications: No outpatient medications have been marked as taking for the 08/15/21 encounter (Appointment) with Darreld Mclean, PA-C.     Allergies:   Tape   Social History   Socioeconomic History   Marital status: Widowed    Spouse name: Not on file   Number of children: Not on file   Years of education: Not on file   Highest education level: Not on file  Occupational History   Not on file  Tobacco Use   Smoking status: Former    Years: 30.00    Types: Cigarettes    Quit date: 09/29/2012    Years since quitting: 8.8   Smokeless  tobacco: Never  Substance and Sexual Activity   Alcohol use: No   Drug use: No   Sexual activity: Not on file  Other Topics Concern   Not on file  Social History Narrative   Not on file   Social Determinants of Health   Financial Resource Strain: Not on file  Food Insecurity: Not on file  Transportation Needs: Not on file  Physical Activity: Not on file  Stress: Not on file  Social Connections: Not on file     Family History: The patient's family history includes Diabetes in her maternal grandmother; Stroke in her father.  ROS:   Please see the history of present illness.     EKGs/Labs/Other Studies Reviewed:    The following studies were reviewed:  Cardiac MRI 07/07/2008: Impressions: 1.  Moderate LV systolic dysfunction with EF 38%.  The inferior and  inferolateral basilar wall appear segmentally worse.  2.  Very small area of mid-wall delayed enhancement in the  inferolateral base. This is nonspecific and can be seen with  infiltrative diseases such as sarcoid as well as with myocarditis.  _______________  Left Cardiac Catheterization 12/20/2015: Conclusions: Mild to moderate, nonobstructive coronary artery disease,  including 15% distal LAD stenosis, 30% ulcerated mid LCx disease, and 40% mid RCA stenosis. Upper normal left ventricular filling pressure (LVEDP 15 mmHg).   Recommendations: Aggressive risk factor modification including high-intensity statin therapy. Continue evidence based heart failure therapy for nonischemic cardiomyopathy. Hydrate gently today following procedure; restart standing furosemide tomorrow. _______________  Echocardiogram 05/05/2016: Study Conclusions: - Left ventricle: The cavity size was normal. Wall thickness was    normal. The estimated ejection fraction was 20%. Diffuse    hypokinesis. Features are consistent with a pseudonormal left    ventricular filling pattern, with concomitant abnormal relaxation    and increased filling pressure (grade 2 diastolic dysfunction).  - Aortic valve: There was no stenosis.  - Mitral valve: There was mild regurgitation.  - Left atrium: The atrium was mildly dilated.  - Right ventricle: The cavity size was normal. Systolic function    was mildly reduced.  - Tricuspid valve: Peak RV-RA gradient (S): 18 mm Hg.  - Pulmonary arteries: PA peak pressure: 21 mm Hg (S).  - Inferior vena cava: The vessel was normal in size. The    respirophasic diameter changes were in the normal range (>= 50%),    consistent with normal central venous pressure.   Impressions: - Normal LV size with EF 20%, diffuse hypokinesis. Moderate    diastolic dysfunction. Normal RV size with mildly decreased    systolic function. Mild mitral regurgitation. The IVC was not    dilated.  _______________  Right Cardiac Catheterization 05/26/2016: Findings: RA = 3 RV = 34/5 PA = 38/12 (21) PCW = 12 Fick cardiac output/index = 4.6/2.6 PVR = 1.9 WU Ao sat = 96% PA sat = 58%, 62%   Assessment: 1. Well compensated filling pressures and cardiac output _______________  Holter Monitor 05/2016: Normal sinus rhythm with frequent PVCs and PACs with occasional brief runs  of SVT.    PVC burden down to 5.8% _______________  Echocardiogram 10/24/2016: Study Conclusions: - Left ventricle: The cavity size was normal. Wall thickness was    normal. Systolic function was normal. The estimated ejection    fraction was in the range of 55% to 60%. Wall motion was normal;    there were no regional wall motion abnormalities. Doppler    parameters are consistent with abnormal left ventricular  relaxation (grade 1 diastolic dysfunction). Doppler parameters    are consistent with high ventricular filling pressure.  - Mitral valve: Valve area by pressure half-time: 1.44 cm^2.   Impressions: - Normal LV systolic function; mild diastolic dysfunction; global    longitudinal strain - 9.2%.  EKG:  EKG not ordered today.   Recent Labs: 09/20/2020: ALT 28; B Natriuretic Peptide 1,638.7; Hemoglobin 10.2; Platelets 243 09/23/2020: BUN 36; Creatinine, Ser 1.37; Magnesium 2.0; Potassium 3.7; Sodium 135  Recent Lipid Panel    Component Value Date/Time   CHOL 189 01/09/2019 1221   TRIG 164 (H) 01/09/2019 1221   HDL 61 01/09/2019 1221   CHOLHDL 3.1 01/09/2019 1221   CHOLHDL 2.8 12/17/2015 0040   VLDL 29 12/17/2015 0040   LDLCALC 100 (H) 01/09/2019 1221    Physical Exam:    Vital Signs: There were no vitals taken for this visit.    Wt Readings from Last 3 Encounters:  01/17/21 143 lb 9.6 oz (65.1 kg)  01/07/21 140 lb (63.5 kg)  12/10/20 129 lb 12.8 oz (58.9 kg)     General: 76 y.o. female in no acute distress. HEENT: Normocephalic and atraumatic. Sclera clear. EOMs intact. Neck: Supple. No carotid bruits. No JVD. Heart: *** RRR. Distinct S1 and S2. No murmurs, gallops, or rubs. Radial and distal pedal pulses 2+ and equal bilaterally. Lungs: No increased work of breathing. Clear to ausculation bilaterally. No wheezes, rhonchi, or rales.  Abdomen: Soft, non-distended, and non-tender to palpation. Bowel sounds present in all 4 quadrants.  MSK: Normal strength and tone  for age. *** Extremities: No lower extremity edema.    Skin: Warm and dry. Neuro: Alert and oriented x3. No focal deficits. Psych: Normal affect. Responds appropriately.   Assessment:    No diagnosis found.  Plan:     Disposition: Follow up in ***   Medication Adjustments/Labs and Tests Ordered: Current medicines are reviewed at length with the patient today.  Concerns regarding medicines are outlined above.  No orders of the defined types were placed in this encounter.  No orders of the defined types were placed in this encounter.   There are no Patient Instructions on file for this visit.   Signed, Darreld Mclean, PA-C  08/03/2021 7:41 PM    [facility]

## 2021-08-15 ENCOUNTER — Encounter: Payer: Self-pay | Admitting: Student

## 2021-08-15 ENCOUNTER — Ambulatory Visit (INDEPENDENT_AMBULATORY_CARE_PROVIDER_SITE_OTHER): Payer: Medicare Other | Admitting: Student

## 2021-08-15 VITALS — BP 128/70 | HR 91 | Ht 63.0 in | Wt 119.0 lb

## 2021-08-15 DIAGNOSIS — R634 Abnormal weight loss: Secondary | ICD-10-CM

## 2021-08-15 DIAGNOSIS — I2510 Atherosclerotic heart disease of native coronary artery without angina pectoris: Secondary | ICD-10-CM | POA: Diagnosis not present

## 2021-08-15 DIAGNOSIS — I10 Essential (primary) hypertension: Secondary | ICD-10-CM

## 2021-08-15 DIAGNOSIS — I428 Other cardiomyopathies: Secondary | ICD-10-CM | POA: Diagnosis not present

## 2021-08-15 DIAGNOSIS — E785 Hyperlipidemia, unspecified: Secondary | ICD-10-CM

## 2021-08-15 DIAGNOSIS — R002 Palpitations: Secondary | ICD-10-CM

## 2021-08-15 DIAGNOSIS — E118 Type 2 diabetes mellitus with unspecified complications: Secondary | ICD-10-CM

## 2021-08-15 DIAGNOSIS — I5042 Chronic combined systolic (congestive) and diastolic (congestive) heart failure: Secondary | ICD-10-CM

## 2021-08-15 DIAGNOSIS — Z794 Long term (current) use of insulin: Secondary | ICD-10-CM

## 2021-08-15 MED ORDER — CARVEDILOL 6.25 MG PO TABS: 6.2500 mg | ORAL_TABLET | Freq: Two times a day (BID) | ORAL | 3 refills | Status: DC

## 2021-08-15 MED ORDER — ASPIRIN 81 MG PO TBEC: 81.0000 mg | DELAYED_RELEASE_TABLET | Freq: Every day | ORAL | 3 refills | Status: DC

## 2021-08-15 MED ORDER — ATORVASTATIN CALCIUM 40 MG PO TABS: 40.0000 mg | ORAL_TABLET | Freq: Every day | ORAL | 3 refills | Status: DC

## 2021-08-15 MED ORDER — FUROSEMIDE 40 MG PO TABS: 40.0000 mg | ORAL_TABLET | Freq: Every day | ORAL | 2 refills | Status: DC

## 2021-08-16 ENCOUNTER — Telehealth: Payer: Self-pay

## 2021-08-16 DIAGNOSIS — I10 Essential (primary) hypertension: Secondary | ICD-10-CM

## 2021-08-16 LAB — BASIC METABOLIC PANEL
BUN/Creatinine Ratio: 22 (ref 12–28)
BUN: 37 mg/dL — ABNORMAL HIGH (ref 8–27)
CO2: 24 mmol/L (ref 20–29)
Calcium: 9.6 mg/dL (ref 8.7–10.3)
Chloride: 98 mmol/L (ref 96–106)
Creatinine, Ser: 1.67 mg/dL — ABNORMAL HIGH (ref 0.57–1.00)
Glucose: 353 mg/dL — ABNORMAL HIGH (ref 70–99)
Potassium: 5.3 mmol/L — ABNORMAL HIGH (ref 3.5–5.2)
Sodium: 136 mmol/L (ref 134–144)
eGFR: 31 mL/min/{1.73_m2} — ABNORMAL LOW (ref >59)

## 2021-08-16 LAB — CBC
Hematocrit: 36.9 % (ref 34.0–46.6)
Hemoglobin: 12.8 g/dL (ref 11.1–15.9)
MCH: 33 pg (ref 26.6–33.0)
MCHC: 34.7 g/dL (ref 31.5–35.7)
MCV: 95 fL (ref 79–97)
Platelets: 220 10*3/uL (ref 150–450)
RBC: 3.88 x10E6/uL (ref 3.77–5.28)
RDW: 13 % (ref 11.7–15.4)
WBC: 6.2 10*3/uL (ref 3.4–10.8)

## 2021-08-16 LAB — TSH: TSH: 1.74 u[IU]/mL (ref 0.450–4.500)

## 2021-08-16 NOTE — Telephone Encounter (Addendum)
Called patient regarding results. Left message for patient to call office.----- Message from Corrin Parker, PA-C sent at 08/16/2021 12:58 PM EDT ----- Rob Bunting, please see below. Thank you!

## 2021-08-16 NOTE — Telephone Encounter (Signed)
Patient returning call.

## 2021-08-18 ENCOUNTER — Telehealth: Payer: Self-pay

## 2021-08-18 ENCOUNTER — Other Ambulatory Visit: Payer: Self-pay

## 2021-08-18 DIAGNOSIS — I10 Essential (primary) hypertension: Secondary | ICD-10-CM

## 2021-08-18 LAB — BASIC METABOLIC PANEL
BUN/Creatinine Ratio: 22 (ref 12–28)
BUN: 45 mg/dL — ABNORMAL HIGH (ref 8–27)
CO2: 27 mmol/L (ref 20–29)
Calcium: 9.6 mg/dL (ref 8.7–10.3)
Chloride: 94 mmol/L — ABNORMAL LOW (ref 96–106)
Creatinine, Ser: 2.09 mg/dL — ABNORMAL HIGH (ref 0.57–1.00)
Glucose: 518 mg/dL (ref 70–99)
Potassium: 4.7 mmol/L (ref 3.5–5.2)
Sodium: 132 mmol/L — ABNORMAL LOW (ref 134–144)
eGFR: 24 mL/min/{1.73_m2} — ABNORMAL LOW (ref >59)

## 2021-08-18 NOTE — Telephone Encounter (Signed)
Received a call from [name] calling to report Glucose 518. Spoke to patient she stated she did not take her insulin before lab was done.Stated she took insulin after she had lab done.Advised I will send results to [facility] PA.

## 2021-08-19 ENCOUNTER — Other Ambulatory Visit: Payer: Self-pay

## 2021-08-19 DIAGNOSIS — I10 Essential (primary) hypertension: Secondary | ICD-10-CM

## 2021-08-19 DIAGNOSIS — I5032 Chronic diastolic (congestive) heart failure: Secondary | ICD-10-CM

## 2021-08-27 LAB — BASIC METABOLIC PANEL
BUN/Creatinine Ratio: 28 (ref 12–28)
BUN: 41 mg/dL — ABNORMAL HIGH (ref 8–27)
CO2: 21 mmol/L (ref 20–29)
Calcium: 9.6 mg/dL (ref 8.7–10.3)
Chloride: 103 mmol/L (ref 96–106)
Creatinine, Ser: 1.45 mg/dL — ABNORMAL HIGH (ref 0.57–1.00)
Glucose: 105 mg/dL — ABNORMAL HIGH (ref 70–99)
Potassium: 5.2 mmol/L (ref 3.5–5.2)
Sodium: 140 mmol/L (ref 134–144)
eGFR: 37 mL/min/{1.73_m2} — ABNORMAL LOW (ref >59)

## 2021-11-15 ENCOUNTER — Other Ambulatory Visit: Payer: Self-pay | Admitting: Family Medicine

## 2021-11-15 DIAGNOSIS — E2839 Other primary ovarian failure: Secondary | ICD-10-CM

## 2022-01-27 ENCOUNTER — Ambulatory Visit: Payer: Medicare Other | Admitting: Cardiology

## 2022-02-02 NOTE — Progress Notes (Signed)
Cardiology Clinic Note   Patient Name: [name] Date of Encounter: 02/03/2022  Primary Care Provider:  Stephani Police, MD Primary Cardiologist:  Glenetta Hew, MD  Patient Profile    76 year old female with a history of non-obstructive CAD noted on cardiac catheterization in 11/2015, non-ischemic cardiomyopathy/ chronic combined CHF with EF as low as 20% in the past but improved to 50-55% on last Echo in 09/2020, palpitations with brief runs of SVT and frequent PVCs/PAC on monitor in 05/2016, temote DVT, hypertension, hyperlipidemia, type 2 diabetes mellitus, CKD stage IIIb, remote breast cancer s/p lumpectomy and radiation as well as chemotherapy (Tamoxifen), and tobacco abuse who is followed by Dr. Ellyn Hack. Last seen in the office by Sande Rives, [place] on 08/15/2021 and was found to have unintentional weight loss of 25 lbs, with normal TSH, and essentially normal BMET. She was to take lasix prn for lower extremity edema.   Past Medical History    Past Medical History:  Diagnosis Date   Breast cancer ([place])    breast - left    Chronic diastolic CHF (congestive heart failure), NYHA class 2 ([place])    a. cMRI 5/10: EF 38% // b. Echo 3/13/: EF 50-55, Gr 1 DD // c. Echo 8/14: EF 40, inf/inf-septal HK, Gr 1 DD, mild MR // d. Echo 5/17: EF 40-45, inf HK, Gr 1 DD, mild MR, mild LAE, reduced RVSF, mild TR, PASP 33 // e. Echo 10/17: EF 40-45, Gr 2 DD, mild MR, severe LAE; => 09/2020 EF 50-55%, GrII DD.   Coronary artery disease, non-occlusive    a. nonobs by LHC in 2010 // b. Myoview 10/17: Lg infarct apex, distal ant and lat walls, no ischemia, EF 38; int risk  // c. [place] 10/17:  dLAD 15, pLCx 66, mRCA 40, LVEDP 15   Diabetes mellitus    NICM (nonischemic cardiomyopathy) ([place])    Past Surgical History:  Procedure Laterality Date   APPENDECTOMY     BREAST LUMPECTOMY     lt breast   CARDIAC CATHETERIZATION N/A 12/20/2015   Procedure: Left Heart Cath and Coronary Angiography;  Surgeon:  Nelva Bush, MD:: Mild to moderate, nonobstructive coronary artery disease, including 15% distal LAD stenosis, 30% ulcerated mid LCx disease, and 40% mid RCA stenosis.  Upper normal left ventricular filling pressure (LVEDP 15 mmHg).   RIGHT HEART CATH N/A 05/08/2016   Procedure: Right Heart Cath;  Surgeon: Jolaine Artist, MD;  Location: [facility];; RA = 3, RV = 34/5, PA = 38/12 (21), PCW = 12; Fick cardiac output/index = 4.6/2.6; PVR = 1.9 WU, Ao sat = 96%; PA sat = 58%, 62%    TRANSTHORACIC ECHOCARDIOGRAM  11/2015   a) 11/2015: EF 40 and 45%.  GRII DD; b) 04/2016: EF 20% with grade 2 diastolic dysfunction   TRANSTHORACIC ECHOCARDIOGRAM  10/2016   a) EF 55-60%.  Normal LV function.  Mild DD.Marland Kitchen  Normal valve Fxn.; b) 09/2020: EF 50 to 55%.  Normal function.  No R WMA.  Mild LVH.  GR 2 DD with elevated LAP.-Mild LA dilation.  Moderately elevated PAP estimated RVSP 47 mmHg, and RAP 8 mmHg.   TRIGGER FINGER RELEASE Left 04/23/2017   Procedure: RELEASE TRIGGER FINGER TO LEFT FORTH FINGER;  Surgeon: Cristine Polio, MD;  Location: [facility];  Service: Plastics;  Laterality: Left;    Allergies  Allergies  Allergen Reactions   Tape Rash    Pt can tolerate the paper tape  History of Present Illness    [name] is a 76 year old female with history as outlined above who presents today for 6 months follow up.  She comes today with her son, with complaints of lower extremity edema.  She also brings with her all of her paperwork to have Entresto assistance which she has filled out and requests that we send to the appropriate company after being signed.  The patient states that she is cut back on her Lasix because she does not like having to urinate so much in the evening.  She has been taking 40 mg in the morning and 40 mg in the evening.  But has eliminated the 40 mg dose in the p.m.  She is noticing worsening lower extremity edema without complaints of shortness of breath,  PND or orthopnea.  Her weight has remained stable.  She does spend a lot of time sitting with her legs hanging down playing games on her computer.  Does very little walking unless she is moving about her home to use the bathroom or, when family comes by to take her to the store or shopping.  She is otherwise medically compliant and asymptomatic.  Home Medications    Current Outpatient Medications  Medication Sig Dispense Refill   atorvastatin (LIPITOR) 40 MG tablet Take 1 tablet (40 mg total) by mouth daily. 90 tablet 3   carvedilol (COREG) 6.25 MG tablet Take 1 tablet (6.25 mg total) by mouth 2 (two) times daily. 180 tablet 3   insulin NPH-regular Human (70-30) 100 UNIT/ML injection 22 units with breakfast, and 12 units with supper. 10 mL    sacubitril-valsartan (ENTRESTO) 49-51 MG Take 1 tablet by mouth 2 (two) times daily. 28 tablet 0   furosemide (LASIX) 40 MG tablet Take 40 mg in the morning, and 20 mg in the evening. 90 tablet 3   No current facility-administered medications for this visit.     Family History    Family History  Problem Relation Age of Onset   Stroke Father    Diabetes Maternal Grandmother    She indicated that her mother is alive. She indicated that her father is deceased. She indicated that the status of her maternal grandmother is unknown.  Social History    Social History   Socioeconomic History   Marital status: Widowed    Spouse name: Not on file   Number of children: Not on file   Years of education: Not on file   Highest education level: Not on file  Occupational History   Not on file  Tobacco Use   Smoking status: Former    Years: 30.00    Types: Cigarettes    Quit date: 09/29/2012    Years since quitting: 9.3   Smokeless tobacco: Never  Substance and Sexual Activity   Alcohol use: No   Drug use: No   Sexual activity: Not on file  Other Topics Concern   Not on file  Social History Narrative   Not on file   Social Determinants of  Health   Financial Resource Strain: Not on file  Food Insecurity: Not on file  Transportation Needs: Not on file  Physical Activity: Not on file  Stress: Not on file  Social Connections: Not on file  Intimate Partner Violence: Not on file     Review of Systems    General:  No chills, fever, night sweats or weight changes.  Cardiovascular:  No chest pain, dyspnea on exertion, bilateral lower extremity  edema, orthopnea, palpitations, paroxysmal nocturnal dyspnea. Dermatological: No rash, lesions/masses Respiratory: No cough, dyspnea Urologic: No hematuria, dysuria Abdominal:   No nausea, vomiting, diarrhea, bright red blood per rectum, melena, or hematemesis Neurologic:  No visual changes, wkns, changes in mental status. All other systems reviewed and are otherwise negative except as noted above.     Physical Exam    VS:  BP 108/62 (BP Location: Left Arm, Patient Position: Sitting, Cuff Size: Normal)   Pulse 63   Ht '5\' 3"'$  (1.6 m)   Wt 119 lb (54 kg)   BMI 21.08 kg/m  , BMI Body mass index is 21.08 kg/m.     GEN: Well nourished, well developed, in no acute distress. HEENT: normal. Neck: Supple, no JVD, carotid bruits, or masses. Cardiac: RRR, no murmurs, rubs, or gallops. No clubbing, cyanosis, 2+ ankle and feet edema, 1+ pretibial edema radials/DP/PT 1+, cool to touch + and equal bilaterally.  Respiratory:  Respirations regular and unlabored, clear to auscultation bilaterally. GI: Soft, nontender, nondistended, BS + x 4. MS: no deformity or atrophy. Skin: warm and dry, no rash. Neuro:  Strength and sensation are diminished, unsteady on her feet. Psych: Normal affect.  Accessory Clinical Findings    ECG personally reviewed by me today-normal sinus rhythm with left anterior fascicular block LVH, heart rate of 63 bpm.- No acute changes  Lab Results  Component Value Date   WBC 6.2 08/15/2021   HGB 12.8 08/15/2021   HCT 36.9 08/15/2021   MCV 95 08/15/2021   PLT 220  08/15/2021   Lab Results  Component Value Date   CREATININE 1.45 (H) 08/26/2021   BUN 41 (H) 08/26/2021   NA 140 08/26/2021   K 5.2 08/26/2021   CL 103 08/26/2021   CO2 21 08/26/2021   Lab Results  Component Value Date   ALT 28 09/20/2020   AST 26 09/20/2020   ALKPHOS 74 09/20/2020   BILITOT 0.8 09/20/2020   Lab Results  Component Value Date   CHOL 189 01/09/2019   HDL 61 01/09/2019   LDLCALC 100 (H) 01/09/2019   TRIG 164 (H) 01/09/2019   CHOLHDL 3.1 01/09/2019    Lab Results  Component Value Date   HGBA1C 10.9 (A) 01/07/2021    Review of Prior Studies: Cardiac MRI 07/07/2008: Impressions: 1.  Moderate LV systolic dysfunction with EF 38%.  The inferior and  inferolateral basilar wall appear segmentally worse.  2.  Very small area of mid-wall delayed enhancement in the  inferolateral base. This is nonspecific and can be seen with  infiltrative diseases such as sarcoid as well as with myocarditis.  _______________   Left Cardiac Catheterization 12/20/2015: Conclusions: Mild to moderate, nonobstructive coronary artery disease, including 15% distal LAD stenosis, 30% ulcerated mid LCx disease, and 40% mid RCA stenosis. Upper normal left ventricular filling pressure (LVEDP 15 mmHg).   Recommendations: Aggressive risk factor modification including high-intensity statin therapy. Continue evidence based heart failure therapy for nonischemic cardiomyopathy. Hydrate gently today following procedure; restart standing furosemide tomorrow. _______________   Echocardiogram 05/05/2016: Study Conclusions: - Left ventricle: The cavity size was normal. Wall thickness was    normal. The estimated ejection fraction was 20%. Diffuse    hypokinesis. Features are consistent with a pseudonormal left    ventricular filling pattern, with concomitant abnormal relaxation    and increased filling pressure (grade 2 diastolic dysfunction).  - Aortic valve: There was no stenosis.  - Mitral  valve: There was mild regurgitation.  - Left atrium:  The atrium was mildly dilated.  - Right ventricle: The cavity size was normal. Systolic function    was mildly reduced.  - Tricuspid valve: Peak RV-RA gradient (S): 18 mm Hg.  - Pulmonary arteries: PA peak pressure: 21 mm Hg (S).  - Inferior vena cava: The vessel was normal in size. The    respirophasic diameter changes were in the normal range (>= 50%),    consistent with normal central venous pressure.   Impressions: - Normal LV size with EF 20%, diffuse hypokinesis. Moderate    diastolic dysfunction. Normal RV size with mildly decreased    systolic function. Mild mitral regurgitation. The IVC was not    dilated.  _______________   Right Cardiac Catheterization 05/26/2016: Findings: RA = 3 RV = 34/5 PA = 38/12 (21) PCW = 12 Fick cardiac output/index = 4.6/2.6 PVR = 1.9 WU Ao sat = 96% PA sat = 58%, 62%   Assessment: 1. Well compensated filling pressures and cardiac output _______________   Holter Monitor 05/2016: Normal sinus rhythm with frequent PVCs and PACs with occasional brief runs of SVT.    PVC burden down to 5.8% _______________   Echocardiogram 09/20/2020 Study Conclusions: 1. Left ventricular ejection fraction, by estimation, is 50 to 55%. The  left ventricle has low normal function. The left ventricle has no regional  wall motion abnormalities. There is mild left ventricular hypertrophy.  Left ventricular diastolic  parameters are consistent with Grade II diastolic dysfunction  (pseudonormalization). Elevated left atrial pressure.   2. Right ventricular systolic function is normal. The right ventricular  size is normal. There is moderately elevated pulmonary artery systolic  pressure. The estimated right ventricular systolic pressure is 16.3 mmHg.   3. Left atrial size was mildly dilated.   4. The mitral valve is normal in structure. Mild mitral valve  regurgitation.   5. The aortic valve is tricuspid.  Aortic valve regurgitation is not  visualized. No aortic stenosis is present.   6. The inferior vena cava is dilated in size with >50% respiratory  variability, suggesting right atrial pressure of 8 mmHg.  - Impressions: - Normal LV systolic function; mild diastolic dysfunction; global    longitudinal strain - 9.2%.  Assessment & Plan   1.  Chronic diastolic CHF: Most recent echocardiogram 09/20/2020 revealed normal LV systolic function, EF of 50 to 55% with grade 2 diastolic dysfunction.  She has chronic dependent edema, and has not been taking evening dose of Lasix because of urination in the evening.    I have asked her to take 40 mg in the morning and 20 mg in the evening, avoid salt, and keep her feet elevated while she is sitting for long periods of time working on her computer.  She does have support hose which she has forgot to wear and I have encouraged her to do so.  I will check a BMET today to evaluate her kidney function, and follow-up with her in 1 month to see her progress.  Continue daily weights.  I have also provided her copies of the paperwork she has given [place] to have continued financial support for [facility].  This has been provided to Dr. Allison Quarry nurse so that she can fill out the paperwork and have him sign it to reinstate her support.  2.  Hypercholesterolemia: Continue statin therapy with goal of LDL less than 70.  3.  Hypertension: Currently blood pressure is well-controlled.  Uncertain that I will be able to titrate up Entresto due to  her frailty, instability on her feet.  Will see how she does with the increased dose of Lasix to 20 mg in the evening (reduction from 40 mg twice daily).  Continue carvedilol.   4.  Insulin-dependent diabetes: Followed by primary care provider.     Current medicines are reviewed at length with the patient today.  I have spent 25 min's  dedicated to the care of this patient on the date of this encounter to include pre-visit review of  records, assessment, management and diagnostic testing,with shared decision making. Signed, Phill Myron. West Pugh, ANP, [place]   02/03/2022 5:12 PM      Office [phone] Fax [phone]  Notice: This dictation was prepared with Dragon dictation along with smaller phrase technology. Any transcriptional errors that result from this process are unintentional and may not be corrected upon review.

## 2022-02-03 ENCOUNTER — Encounter: Payer: Self-pay | Admitting: Adult Health

## 2022-02-03 ENCOUNTER — Ambulatory Visit: Payer: Medicare Other | Attending: Cardiology | Admitting: Adult Health

## 2022-02-03 VITALS — BP 108/62 | HR 63 | Ht 63.0 in | Wt 119.0 lb

## 2022-02-03 DIAGNOSIS — I2510 Atherosclerotic heart disease of native coronary artery without angina pectoris: Secondary | ICD-10-CM | POA: Diagnosis not present

## 2022-02-03 DIAGNOSIS — I5032 Chronic diastolic (congestive) heart failure: Secondary | ICD-10-CM | POA: Diagnosis not present

## 2022-02-03 DIAGNOSIS — Z794 Long term (current) use of insulin: Secondary | ICD-10-CM | POA: Insufficient documentation

## 2022-02-03 DIAGNOSIS — E7800 Pure hypercholesterolemia, unspecified: Secondary | ICD-10-CM | POA: Diagnosis not present

## 2022-02-03 DIAGNOSIS — E118 Type 2 diabetes mellitus with unspecified complications: Secondary | ICD-10-CM | POA: Diagnosis not present

## 2022-02-03 DIAGNOSIS — I10 Essential (primary) hypertension: Secondary | ICD-10-CM | POA: Insufficient documentation

## 2022-02-03 MED ORDER — FUROSEMIDE 40 MG PO TABS: ORAL_TABLET | ORAL | 3 refills | Status: DC

## 2022-02-03 NOTE — Patient Instructions (Addendum)
Medication Instructions:  Take Lasix 40 mg in the morning and 20 mg in the evening.  *If you need a refill on your cardiac medications before your next appointment, please call your pharmacy*   Lab Work: BMET today   If you have labs (blood work) drawn today and your tests are completely normal, you will receive your results only by: [place] (if you have MyChart) OR A paper copy in the mail If you have any lab test that is abnormal or we need to change your treatment, we will call you to review the results.   Follow-Up: At [facility], you and your health needs are our priority.  As part of our continuing mission to provide you with exceptional heart care, we have created designated Provider Care Teams.  These Care Teams include your primary Cardiologist (physician) and Advanced Practice Providers (APPs -  Physician Assistants and Nurse Practitioners) who all work together to provide you with the care you need, when you need it.  We recommend signing up for the patient portal called "MyChart".  Sign up information is provided on this After Visit Summary.  MyChart is used to connect with patients for Virtual Visits (Telemedicine).  Patients are able to view lab/test results, encounter notes, upcoming appointments, etc.  Non-urgent messages can be sent to your provider as well.   To learn more about what you can do with MyChart, go to [email or web address].    Your next appointment:   2 month(s)  The format for your next appointment:   In Person  Provider:   Jory Sims, NP

## 2022-02-04 LAB — BASIC METABOLIC PANEL
BUN/Creatinine Ratio: 23 (ref 12–28)
BUN: 40 mg/dL — ABNORMAL HIGH (ref 8–27)
CO2: 27 mmol/L (ref 20–29)
Calcium: 10.1 mg/dL (ref 8.7–10.3)
Chloride: 95 mmol/L — ABNORMAL LOW (ref 96–106)
Creatinine, Ser: 1.73 mg/dL — ABNORMAL HIGH (ref 0.57–1.00)
Glucose: 97 mg/dL (ref 70–99)
Potassium: 4.6 mmol/L (ref 3.5–5.2)
Sodium: 139 mmol/L (ref 134–144)
eGFR: 30 mL/min/{1.73_m2} — ABNORMAL LOW (ref >59)

## 2022-02-06 ENCOUNTER — Telehealth: Payer: Self-pay

## 2022-02-06 DIAGNOSIS — I10 Essential (primary) hypertension: Secondary | ICD-10-CM

## 2022-02-06 NOTE — Telephone Encounter (Addendum)
Called patient regarding results. Patient had understanding of results. And repeat BMET in 1 month and 1 month f/u.----- Message from Lendon Colonel, NP sent at 02/05/2022 11:35 AM EST ----- I have reviewed the BMET. Creatinine has worsened with reduced use of lasix to 40 mg daily. I increased her to 40 mg in am and 20 mg in pm due to worsening edema. She is to be seen in one month. Repeat BMET just before appointment. Avoid salt and keep legs elevated when sitting. Continue daily weights.   Curt Bears

## 2022-02-22 ENCOUNTER — Telehealth: Payer: Self-pay | Admitting: Cardiology

## 2022-02-22 NOTE — Telephone Encounter (Signed)
Patient states she spoke with [employer] regarding her patient assistance application and they are missing her proof of income. She states she gave [name] 2 copies of her proof of income and she would like to confirm that this information has been sent. Please advise.

## 2022-02-22 NOTE — Telephone Encounter (Signed)
Patient stated she brought her proof of income on 02/03/22 at her last OV so it could be sent to [employer] for patient assistance. Patient is asking if the information was sent to [employer]. The deadline is 03/06/22.

## 2022-02-23 ENCOUNTER — Telehealth: Payer: Self-pay | Admitting: Cardiology

## 2022-02-23 ENCOUNTER — Telehealth: Payer: Self-pay

## 2022-02-23 NOTE — Telephone Encounter (Signed)
Called patient regarding proof of income for patient assistance. Unable to reach patient left message for patient to call office.

## 2022-02-23 NOTE — Telephone Encounter (Signed)
Forms were given to Dr.Harding's RN- she faxed these.   I will send a message to her to review. Thank you!

## 2022-02-23 NOTE — Telephone Encounter (Signed)
Patient returned call. Advised patient in order to process patient assistance application. Will need to bring proof of income from 2023 patient stated will bring proof of income to office.

## 2022-02-23 NOTE — Telephone Encounter (Signed)
Patient returning call.

## 2022-02-23 NOTE — Telephone Encounter (Signed)
The forms were faxed to [employer] . The  income information was from 2021.  Patient was made aware by the CMA  to bring any documentation of income from 2023 . Per CMA,  patient states she will brill documentation of social security checks.  Once received will refax information

## 2022-02-23 NOTE — Telephone Encounter (Signed)
Merri Ray A, CMA  P Cv Div Nl Scheduling Good Morning;  Please schedule the above patient for 1 month with Jory Sims. This patient can be put in any spot per [place].  Thanks   Patient has been contacted 3x with no success in scheduling.

## 2022-02-26 ENCOUNTER — Inpatient Hospital Stay (HOSPITAL_BASED_OUTPATIENT_CLINIC_OR_DEPARTMENT_OTHER)
Admission: EM | Admit: 2022-02-26 | Discharge: 2022-03-04 | DRG: 871 | Disposition: A | Discharge status: Home / Self Care | Payer: Medicare Other | Attending: Internal Medicine | Admitting: Internal Medicine

## 2022-02-26 ENCOUNTER — Emergency Department (HOSPITAL_BASED_OUTPATIENT_CLINIC_OR_DEPARTMENT_OTHER): Payer: Medicare Other

## 2022-02-26 ENCOUNTER — Other Ambulatory Visit: Payer: Self-pay

## 2022-02-26 ENCOUNTER — Encounter (HOSPITAL_COMMUNITY): Payer: Self-pay

## 2022-02-26 ENCOUNTER — Encounter (HOSPITAL_BASED_OUTPATIENT_CLINIC_OR_DEPARTMENT_OTHER): Payer: Self-pay

## 2022-02-26 DIAGNOSIS — J9691 Respiratory failure, unspecified with hypoxia: Secondary | ICD-10-CM | POA: Diagnosis present

## 2022-02-26 DIAGNOSIS — Z1152 Encounter for screening for COVID-19: Secondary | ICD-10-CM

## 2022-02-26 DIAGNOSIS — Z833 Family history of diabetes mellitus: Secondary | ICD-10-CM

## 2022-02-26 DIAGNOSIS — I428 Other cardiomyopathies: Secondary | ICD-10-CM | POA: Diagnosis present

## 2022-02-26 DIAGNOSIS — R4182 Altered mental status, unspecified: Secondary | ICD-10-CM

## 2022-02-26 DIAGNOSIS — Z6820 Body mass index (BMI) 20.0-20.9, adult: Secondary | ICD-10-CM

## 2022-02-26 DIAGNOSIS — Z86718 Personal history of other venous thrombosis and embolism: Secondary | ICD-10-CM

## 2022-02-26 DIAGNOSIS — R6520 Severe sepsis without septic shock: Secondary | ICD-10-CM | POA: Diagnosis present

## 2022-02-26 DIAGNOSIS — Z923 Personal history of irradiation: Secondary | ICD-10-CM

## 2022-02-26 DIAGNOSIS — Z794 Long term (current) use of insulin: Secondary | ICD-10-CM

## 2022-02-26 DIAGNOSIS — I493 Ventricular premature depolarization: Secondary | ICD-10-CM | POA: Diagnosis present

## 2022-02-26 DIAGNOSIS — E1165 Type 2 diabetes mellitus with hyperglycemia: Secondary | ICD-10-CM | POA: Diagnosis present

## 2022-02-26 DIAGNOSIS — J210 Acute bronchiolitis due to respiratory syncytial virus: Secondary | ICD-10-CM | POA: Diagnosis present

## 2022-02-26 DIAGNOSIS — I471 Supraventricular tachycardia, unspecified: Secondary | ICD-10-CM | POA: Diagnosis present

## 2022-02-26 DIAGNOSIS — J121 Respiratory syncytial virus pneumonia: Secondary | ICD-10-CM | POA: Diagnosis present

## 2022-02-26 DIAGNOSIS — N183 Chronic kidney disease, stage 3 unspecified: Secondary | ICD-10-CM | POA: Diagnosis present

## 2022-02-26 DIAGNOSIS — J9602 Acute respiratory failure with hypercapnia: Secondary | ICD-10-CM | POA: Diagnosis not present

## 2022-02-26 DIAGNOSIS — N179 Acute kidney failure, unspecified: Secondary | ICD-10-CM | POA: Diagnosis present

## 2022-02-26 DIAGNOSIS — R7401 Elevation of levels of liver transaminase levels: Secondary | ICD-10-CM | POA: Diagnosis present

## 2022-02-26 DIAGNOSIS — I2510 Atherosclerotic heart disease of native coronary artery without angina pectoris: Secondary | ICD-10-CM | POA: Diagnosis present

## 2022-02-26 DIAGNOSIS — Z853 Personal history of malignant neoplasm of breast: Secondary | ICD-10-CM

## 2022-02-26 DIAGNOSIS — Z79899 Other long term (current) drug therapy: Secondary | ICD-10-CM

## 2022-02-26 DIAGNOSIS — Q2112 Patent foramen ovale: Secondary | ICD-10-CM

## 2022-02-26 DIAGNOSIS — A419 Sepsis, unspecified organism: Principal | ICD-10-CM | POA: Diagnosis present

## 2022-02-26 DIAGNOSIS — E785 Hyperlipidemia, unspecified: Secondary | ICD-10-CM | POA: Diagnosis present

## 2022-02-26 DIAGNOSIS — Z9221 Personal history of antineoplastic chemotherapy: Secondary | ICD-10-CM

## 2022-02-26 DIAGNOSIS — Z823 Family history of stroke: Secondary | ICD-10-CM

## 2022-02-26 DIAGNOSIS — Z91048 Other nonmedicinal substance allergy status: Secondary | ICD-10-CM

## 2022-02-26 DIAGNOSIS — R69 Illness, unspecified: Secondary | ICD-10-CM

## 2022-02-26 DIAGNOSIS — E860 Dehydration: Secondary | ICD-10-CM | POA: Diagnosis present

## 2022-02-26 DIAGNOSIS — J159 Unspecified bacterial pneumonia: Secondary | ICD-10-CM | POA: Diagnosis present

## 2022-02-26 DIAGNOSIS — I959 Hypotension, unspecified: Secondary | ICD-10-CM | POA: Diagnosis present

## 2022-02-26 DIAGNOSIS — E440 Moderate protein-calorie malnutrition: Secondary | ICD-10-CM | POA: Insufficient documentation

## 2022-02-26 DIAGNOSIS — J9601 Acute respiratory failure with hypoxia: Secondary | ICD-10-CM | POA: Diagnosis present

## 2022-02-26 DIAGNOSIS — N1832 Chronic kidney disease, stage 3b: Secondary | ICD-10-CM | POA: Diagnosis present

## 2022-02-26 DIAGNOSIS — E119 Type 2 diabetes mellitus without complications: Secondary | ICD-10-CM

## 2022-02-26 DIAGNOSIS — I5022 Chronic systolic (congestive) heart failure: Secondary | ICD-10-CM | POA: Diagnosis present

## 2022-02-26 DIAGNOSIS — E1122 Type 2 diabetes mellitus with diabetic chronic kidney disease: Secondary | ICD-10-CM | POA: Diagnosis present

## 2022-02-26 DIAGNOSIS — I5032 Chronic diastolic (congestive) heart failure: Secondary | ICD-10-CM | POA: Diagnosis present

## 2022-02-26 DIAGNOSIS — I5043 Acute on chronic combined systolic (congestive) and diastolic (congestive) heart failure: Secondary | ICD-10-CM | POA: Diagnosis present

## 2022-02-26 DIAGNOSIS — E1169 Type 2 diabetes mellitus with other specified complication: Secondary | ICD-10-CM

## 2022-02-26 DIAGNOSIS — N170 Acute kidney failure with tubular necrosis: Secondary | ICD-10-CM | POA: Diagnosis present

## 2022-02-26 DIAGNOSIS — Z87891 Personal history of nicotine dependence: Secondary | ICD-10-CM

## 2022-02-26 DIAGNOSIS — I130 Hypertensive heart and chronic kidney disease with heart failure and stage 1 through stage 4 chronic kidney disease, or unspecified chronic kidney disease: Secondary | ICD-10-CM | POA: Diagnosis present

## 2022-02-26 DIAGNOSIS — I10 Essential (primary) hypertension: Secondary | ICD-10-CM | POA: Diagnosis present

## 2022-02-26 LAB — CBC WITH DIFFERENTIAL/PLATELET
Abs Immature Granulocytes: 0.02 10*3/uL (ref 0.00–0.07)
Basophils Absolute: 0 10*3/uL (ref 0.0–0.1)
Basophils Relative: 0 %
Eosinophils Absolute: 0 10*3/uL (ref 0.0–0.5)
Eosinophils Relative: 0 %
HCT: 27.5 % — ABNORMAL LOW (ref 36.0–46.0)
Hemoglobin: 9.3 g/dL — ABNORMAL LOW (ref 12.0–15.0)
Immature Granulocytes: 0 %
Lymphocytes Relative: 12 %
Lymphs Abs: 0.9 10*3/uL (ref 0.7–4.0)
MCH: 31.3 pg (ref 26.0–34.0)
MCHC: 33.8 g/dL (ref 30.0–36.0)
MCV: 92.6 fL (ref 80.0–100.0)
Monocytes Absolute: 0.6 10*3/uL (ref 0.1–1.0)
Monocytes Relative: 9 %
Neutro Abs: 5.9 10*3/uL (ref 1.7–7.7)
Neutrophils Relative %: 79 %
Platelets: 150 10*3/uL (ref 150–400)
RBC: 2.97 MIL/uL — ABNORMAL LOW (ref 3.87–5.11)
RDW: 14.7 % (ref 11.5–15.5)
WBC: 7.4 10*3/uL (ref 4.0–10.5)
nRBC: 0 % (ref 0.0–0.2)

## 2022-02-26 LAB — COMPREHENSIVE METABOLIC PANEL
ALT: 164 U/L — ABNORMAL HIGH (ref 0–44)
AST: 164 U/L — ABNORMAL HIGH (ref 15–41)
Albumin: 3.7 g/dL (ref 3.5–5.0)
Alkaline Phosphatase: 100 U/L (ref 38–126)
Anion gap: 12 (ref 5–15)
BUN: 51 mg/dL — ABNORMAL HIGH (ref 8–23)
CO2: 29 mmol/L (ref 22–32)
Calcium: 9.3 mg/dL (ref 8.9–10.3)
Chloride: 94 mmol/L — ABNORMAL LOW (ref 98–111)
Creatinine, Ser: 2.22 mg/dL — ABNORMAL HIGH (ref 0.44–1.00)
GFR, Estimated: 22 mL/min — ABNORMAL LOW (ref >60)
Glucose, Bld: 349 mg/dL — ABNORMAL HIGH (ref 70–99)
Potassium: 4.2 mmol/L (ref 3.5–5.1)
Sodium: 135 mmol/L (ref 135–145)
Total Bilirubin: 1.2 mg/dL (ref 0.3–1.2)
Total Protein: 6.5 g/dL (ref 6.5–8.1)

## 2022-02-26 LAB — RESP PANEL BY RT-PCR (RSV, FLU A&B, COVID)  RVPGX2
Influenza A by PCR: NEGATIVE
Influenza B by PCR: NEGATIVE
Resp Syncytial Virus by PCR: POSITIVE — AB
SARS Coronavirus 2 by RT PCR: NEGATIVE

## 2022-02-26 LAB — LACTIC ACID, PLASMA: Lactic Acid, Venous: 1.4 mmol/L (ref 0.5–1.9)

## 2022-02-26 LAB — BRAIN NATRIURETIC PEPTIDE: B Natriuretic Peptide: >4500 pg/mL — ABNORMAL HIGH (ref 0.0–100.0)

## 2022-02-26 MED ORDER — SODIUM CHLORIDE 0.9 % IV SOLN
1.0000 g | Freq: Once | INTRAVENOUS | Status: AC
  Administered 2022-02-26: 1 g via INTRAVENOUS
  Filled 2022-02-26: qty 10

## 2022-02-26 MED ORDER — ONDANSETRON HCL 4 MG/2ML IJ SOLN
4.0000 mg | Freq: Once | INTRAMUSCULAR | Status: AC
  Administered 2022-02-26: 4 mg via INTRAVENOUS
  Filled 2022-02-26: qty 2

## 2022-02-26 MED ORDER — METHYLPREDNISOLONE SODIUM SUCC 125 MG IJ SOLR
125.0000 mg | Freq: Once | INTRAMUSCULAR | Status: AC
  Administered 2022-02-26: 125 mg via INTRAVENOUS
  Filled 2022-02-26: qty 2

## 2022-02-26 MED ORDER — IPRATROPIUM-ALBUTEROL 0.5-2.5 (3) MG/3ML IN SOLN
3.0000 mL | Freq: Once | RESPIRATORY_TRACT | Status: AC
  Administered 2022-02-26: 3 mL via RESPIRATORY_TRACT
  Filled 2022-02-26: qty 3

## 2022-02-26 MED ORDER — LACTATED RINGERS IV BOLUS
1000.0000 mL | Freq: Once | INTRAVENOUS | Status: AC
  Administered 2022-02-26: 1000 mL via INTRAVENOUS

## 2022-02-26 MED ORDER — SODIUM CHLORIDE 0.9 % IV SOLN
500.0000 mg | Freq: Once | INTRAVENOUS | Status: AC
  Administered 2022-02-26: 500 mg via INTRAVENOUS
  Filled 2022-02-26: qty 5

## 2022-02-26 NOTE — ED Triage Notes (Signed)
Patient arrives to ED POV from UC c/o SHOB x3 days. Per Pt's daughter pt has been c/o [facility], feeling weak and not eating since Thursday and was taken to UC today. Pt's Daughter states that her O2 at UC was 86% RA and was instructed to to come to the ER. Denies HX of being on O2.

## 2022-02-26 NOTE — ED Notes (Signed)
RT obtained IV in left forearm that blew the vein and was removed. RT placed IV in left AC performed cultures, flushed, saline locked and secured IV.

## 2022-02-26 NOTE — ED Notes (Signed)
Pulse Ox readings would drop to 88% RA therefor Pt was placed on 4lpm [place] for support. O2 readings came up to 97-98%.

## 2022-02-26 NOTE — Plan of Care (Signed)
   Patient Name: [name], [name] DOB: [birth year] MRN: [id number] Transferring facility: [facility] Requesting provider: pfeiffer, md Reason for transfer: RSV pneumonia,  77 yo female with 4 day hx of cough, SOB, chest congestion. increased confusion at home. hx of COPD, hx diastolic CHF. BNP >4500. no peripheral edema. WBC 7.4. EDP gave IV ABX. reportedly pt not taking her meds for several days due to feeling poorly. HR 60s. Going to: [facility] Admission Status: observation Bed Type: med/tele To Do: needs evaluation to see if pt has acute CHF. has elevated BNP but no other signs of fluid overload.  TR[facility] will assume care on arrival to accepting facility. Until arrival, medical decision making responsibilities remain with the EDP.  However, [facility] available 24/7 for questions and assistance.   Nursing staff please page [place] and Consults [phone]) as soon as the patient arrives to the hospital.  Kristopher Oppenheim, DO [facility]

## 2022-02-26 NOTE — ED Provider Notes (Signed)
[facility] EMERGENC[facility] DEPT Provider Note   CSN: [id number] Arrival date & time: 02/26/22  1712     History  Chief Complaint  Patient presents with   Shortness of Breath    [name] is a 77 y.o. female.  HPI history of non-obstructive CAD noted on cardiac catheterization in 11/2015, non-ischemic cardiomyopathy/ chronic combined CHF with EF as low as 20% in the past but improved to 50-55% on last Echo in 09/2020, palpitations with brief runs of SVT and frequent PVCs/PAC on monitor in 05/2016, temote DVT, hypertension, hyperlipidemia, type 2 diabetes mellitus, CKD stage IIIb, remote breast cancer s/p lumpectomy and radiation as well as chemotherapy (Tamoxifen), and tobacco abuse   Patient started getting sick Thursday, 4 days ago.  She started getting a cough and congestion.  Then she became drowsy and sleeping more than usual.  For the past 2 days she has had very little to eat or drink.  Patient's daughter checked on her this morning and got her to eat a little bit of chicken noodle soup and take her medications.  She reports by looking at the medications, she does not think her mom took the medications for the past 2 days while she has been sick.  She let her rest and she slept all day and then she checked on her again later in the afternoon and she was still sleeping and ill in appearance.  At that time they went to urgent care and urgent care and identified oxygen saturation about 86% and sent the patient to the emergency department.  Patient reports she has some chest pain with coughing but not ongoing chest pain.  She is not having swelling of the legs.  Decreased appetite some nausea but no active vomiting.  Patient's daughter reports at baseline, the patient is active around her home taking care of basic needs.  She does not drive but has normal cognitive function.    Home Medications Prior to Admission medications   Medication Sig Start Date End Date Taking? Authorizing  Provider  atorvastatin (LIPITOR) 40 MG tablet Take 1 tablet (40 mg total) by mouth daily. 08/15/21   Sande Rives E, PA-C  carvedilol (COREG) 6.25 MG tablet Take 1 tablet (6.25 mg total) by mouth 2 (two) times daily. 08/15/21   Darreld Mclean, PA-C  furosemide (LASIX) 40 MG tablet Take 40 mg in the morning, and 20 mg in the evening. 02/03/22   Lendon Colonel, NP  insulin NPH-regular Human (70-30) 100 UNIT/ML injection 22 units with breakfast, and 12 units with supper. 12/10/20   Renato Shin, MD  sacubitril-valsartan (ENTRESTO) 49-51 MG Take 1 tablet by mouth 2 (two) times daily. 03/01/21   Leonie Man, MD      Allergies    Tape    Review of Systems   Review of Systems  Physical Exam Updated Vital Signs BP (!) 108/59   Pulse 61   Temp 98.6 F (37 C) (Oral)   Resp (!) 23   Ht 5' 3.5" (1.613 m)   Wt 52.2 kg   SpO2 98%   BMI 20.05 kg/m  Physical Exam Constitutional:      Comments: Patient is fatigued and ill in appearance.  Audible wheeze and chest congestion.  Good condition for physical age at baseline.  HENT:     Mouth/Throat:     Pharynx: Oropharynx is clear.  Eyes:     Extraocular Movements: Extraocular movements intact.  Cardiovascular:     Rate and  Rhythm: Normal rate and regular rhythm.  Pulmonary:     Comments: Mild increased work of breathing.  Extensive wheezing and crackles throughout both lung fields.  Wet sounding cough. Abdominal:     General: There is no distension.     Palpations: Abdomen is soft.     Tenderness: There is no abdominal tenderness. There is no guarding.  Musculoskeletal:        General: No swelling or tenderness. Normal range of motion.     Right lower leg: No edema.     Left lower leg: No edema.  Skin:    General: Skin is warm and dry.  Neurological:     Comments: Patient is resting but awakens to light voice.  She is responding to simple questions and following simple commands.  No focal motor deficits.     ED Results  / Procedures / Treatments   Labs (all labs ordered are listed, but only abnormal results are displayed) Labs Reviewed  RESP PANEL BY RT-PCR (RSV, FLU A&B, COVID)  RVPGX2 - Abnormal; Notable for the following components:      Result Value   Resp Syncytial Virus by PCR POSITIVE (*)    All other components within normal limits  COMPREHENSIVE METABOLIC PANEL - Abnormal; Notable for the following components:   Chloride 94 (*)    Glucose, Bld 349 (*)    BUN 51 (*)    Creatinine, Ser 2.22 (*)    AST 164 (*)    ALT 164 (*)    GFR, Estimated 22 (*)    All other components within normal limits  CBC WITH DIFFERENTIAL/PLATELET - Abnormal; Notable for the following components:   RBC 2.97 (*)    Hemoglobin 9.3 (*)    HCT 27.5 (*)    All other components within normal limits  BRAIN NATRIURETIC PEPTIDE - Abnormal; Notable for the following components:   B Natriuretic Peptide >4,500.0 (*)    All other components within normal limits  CULTURE, BLOOD (ROUTINE X 2)  CULTURE, BLOOD (ROUTINE X 2)  LACTIC ACID, PLASMA  LACTIC ACID, PLASMA    EKG EKG Interpretation  Date/Time:  Sunday February 26 2022 17:51:06 EST Ventricular Rate:  64 PR Interval:  172 QRS Duration: 102 QT Interval:  360 QTC Calculation: 371 R Axis:   -53 Text Interpretation: Normal sinus rhythm Pulmonary disease pattern Left anterior fascicular block Septal infarct , age undetermined Abnormal ECG When compared with ECG of 20-Sep-2020 09:33, PREVIOUS ECG IS PRESENT rate slower than previous and decreased anterior voltage. no STEMI Confirmed by Charlesetta Shanks [phone]) on 02/26/2022 9:11:41 PM  Radiology DG Chest Port 1 View  Result Date: 02/26/2022 CLINICAL DATA:  Hypoxia EXAM: PORTABLE CHEST - 1 VIEW COMPARISON:  09/21/2020 FINDINGS: The heart size and mediastinal contours are within normal limits. There is diffuse pulmonary interstitial prominence which could be seen with atypical infection, edema or interstitial lung disease.  Alveolar density left mid lung could represent developing consolidative pneumonia. Aorta is calcified. The visualized skeletal structures are unremarkable. IMPRESSION: Nonspecific interstitial prominence consistent with atypical infection, edema or interstitial lung disease. Left mid lung consolidation may represent early bronchopneumonia. Electronically Signed   By: Sammie Bench M.D.   On: 02/26/2022 19:06    Procedures Procedures  CRITICAL CARE Performed by: Charlesetta Shanks   Total critical care time: 30 minutes  Critical care time was exclusive of separately billable procedures and treating other patients.  Critical care was necessary to treat or prevent imminent or life-threatening  deterioration.  Critical care was time spent personally by me on the following activities: development of treatment plan with patient and/or surrogate as well as nursing, discussions with consultants, evaluation of patient's response to treatment, examination of patient, obtaining history from patient or surrogate, ordering and performing treatments and interventions, ordering and review of laboratory studies, ordering and review of radiographic studies, pulse oximetry and re-evaluation of patient's condition.   Medications Ordered in ED Medications  cefTRIAXone (ROCEPHIN) 1 g in sodium chloride 0.9 % 100 mL IVPB (has no administration in time range)  azithromycin (ZITHROMAX) 500 mg in sodium chloride 0.9 % 250 mL IVPB (has no administration in time range)  ipratropium-albuterol (DUONEB) 0.5-2.5 (3) MG/3ML nebulizer solution 3 mL (has no administration in time range)  methylPREDNISolone sodium succinate (SOLU-MEDROL) 125 mg/2 mL injection 125 mg (has no administration in time range)    ED Course/ Medical Decision Making/ A&P                           Medical Decision Making Amount and/or Complexity of Data Reviewed Labs: ordered. Radiology: ordered.  Risk Prescription drug management. Decision  regarding hospitalization.  Has been ill for approximately 4 days.  For the past 2 days poor oral intake and decreased activity level with prolonged sleeping.  Patient has very congested sounding chest and cough.  Differential diagnosis includes pneumonia\congestive heart failure\viral pneumonia\sepsis.  Will proceed with diagnostic evaluation including chest x-ray, BNP and CBC and basic lab work.  Chest x-ray visually reviewed by myself and radiology interpretation reviewed.  Patient does have some diffuse vascular congestion and focal area in the left lung of possible consolidation suggestive of early pneumonia.  Patient test positive for RSV and BNP is greater than 4000.  BUN and creatinine elevated from baseline consistent with AKI.  Will start Rocephin and Zithromax for possible secondary pneumonia based on chest x-ray and patient at risk for complications of RSV.  Pulmonary examination has a lot of wheezing and crackles.  Patient however does not have peripheral edema.  Consult: Dr. Bridgett Larsson to admit.  We reviewed the BNP and clinical findings in the setting of RSV.  At this time will treat RSV with steroid therapy and DuoNebs.  Temporarily hold off on IV fluid or Lasix as patient has stable vital signs.  Will continue to observe for clear indication if BNP represents acute CHF exacerbation.   At about 2300 the patient developed lower abdominal pain and significant discomfort with nausea.  She also then had hypotensive episode with blood pressures dropping down into the 80s.  She was uncomfortable in appearance and endorsing significant lower abdominal pain.  She had some nausea and episode of vomiting.  With hypotension, a liter of lactated Ringer's ordered.  Questionable whether advancing sepsis versus dehydration with AKI.  Bladder scan done patient is not appearing to retain urine.  Will also try to obtain a CT scan for this acute change.  00 30 patient looks improved after liter LR.  Blood  pressures are now greater than 694 systolic.  Patient stable for CT scan and anticipate admission for stepdown.       Final Clinical Impression(s) / ED Diagnoses Final diagnoses:  RSV (acute bronchiolitis due to respiratory syncytial virus)  Altered mental status, unspecified altered mental status type  AKI (acute kidney injury) ([place])  Severe comorbid illness    Rx / DC Orders ED Discharge Orders     None  Charlesetta Shanks, MD 02/26/22 2152    Charlesetta Shanks, MD 02/27/22 4103    Charlesetta Shanks, MD 02/27/22 [phone]

## 2022-02-27 ENCOUNTER — Emergency Department (HOSPITAL_BASED_OUTPATIENT_CLINIC_OR_DEPARTMENT_OTHER): Payer: Medicare Other

## 2022-02-27 ENCOUNTER — Inpatient Hospital Stay (HOSPITAL_COMMUNITY): Payer: Medicare Other

## 2022-02-27 DIAGNOSIS — E860 Dehydration: Secondary | ICD-10-CM | POA: Diagnosis present

## 2022-02-27 DIAGNOSIS — N170 Acute kidney failure with tubular necrosis: Secondary | ICD-10-CM | POA: Diagnosis present

## 2022-02-27 DIAGNOSIS — A419 Sepsis, unspecified organism: Secondary | ICD-10-CM | POA: Diagnosis present

## 2022-02-27 DIAGNOSIS — N1832 Chronic kidney disease, stage 3b: Secondary | ICD-10-CM

## 2022-02-27 DIAGNOSIS — N179 Acute kidney failure, unspecified: Secondary | ICD-10-CM | POA: Diagnosis present

## 2022-02-27 DIAGNOSIS — R7401 Elevation of levels of liver transaminase levels: Secondary | ICD-10-CM | POA: Diagnosis present

## 2022-02-27 DIAGNOSIS — I5032 Chronic diastolic (congestive) heart failure: Secondary | ICD-10-CM

## 2022-02-27 DIAGNOSIS — I5020 Unspecified systolic (congestive) heart failure: Secondary | ICD-10-CM | POA: Diagnosis not present

## 2022-02-27 DIAGNOSIS — Q2112 Patent foramen ovale: Secondary | ICD-10-CM | POA: Diagnosis not present

## 2022-02-27 DIAGNOSIS — Z794 Long term (current) use of insulin: Secondary | ICD-10-CM

## 2022-02-27 DIAGNOSIS — J210 Acute bronchiolitis due to respiratory syncytial virus: Secondary | ICD-10-CM | POA: Diagnosis present

## 2022-02-27 DIAGNOSIS — I429 Cardiomyopathy, unspecified: Secondary | ICD-10-CM | POA: Diagnosis not present

## 2022-02-27 DIAGNOSIS — I471 Supraventricular tachycardia, unspecified: Secondary | ICD-10-CM | POA: Diagnosis present

## 2022-02-27 DIAGNOSIS — J9691 Respiratory failure, unspecified with hypoxia: Secondary | ICD-10-CM | POA: Diagnosis present

## 2022-02-27 DIAGNOSIS — E119 Type 2 diabetes mellitus without complications: Secondary | ICD-10-CM

## 2022-02-27 DIAGNOSIS — E785 Hyperlipidemia, unspecified: Secondary | ICD-10-CM | POA: Diagnosis present

## 2022-02-27 DIAGNOSIS — R6520 Severe sepsis without septic shock: Secondary | ICD-10-CM | POA: Diagnosis present

## 2022-02-27 DIAGNOSIS — J9602 Acute respiratory failure with hypercapnia: Secondary | ICD-10-CM | POA: Diagnosis not present

## 2022-02-27 DIAGNOSIS — J121 Respiratory syncytial virus pneumonia: Secondary | ICD-10-CM | POA: Diagnosis not present

## 2022-02-27 DIAGNOSIS — I5031 Acute diastolic (congestive) heart failure: Secondary | ICD-10-CM | POA: Diagnosis not present

## 2022-02-27 DIAGNOSIS — I130 Hypertensive heart and chronic kidney disease with heart failure and stage 1 through stage 4 chronic kidney disease, or unspecified chronic kidney disease: Secondary | ICD-10-CM | POA: Diagnosis present

## 2022-02-27 DIAGNOSIS — Z87891 Personal history of nicotine dependence: Secondary | ICD-10-CM | POA: Diagnosis not present

## 2022-02-27 DIAGNOSIS — R69 Illness, unspecified: Secondary | ICD-10-CM | POA: Diagnosis not present

## 2022-02-27 DIAGNOSIS — E440 Moderate protein-calorie malnutrition: Secondary | ICD-10-CM | POA: Diagnosis present

## 2022-02-27 DIAGNOSIS — I428 Other cardiomyopathies: Secondary | ICD-10-CM | POA: Diagnosis present

## 2022-02-27 DIAGNOSIS — J9601 Acute respiratory failure with hypoxia: Secondary | ICD-10-CM | POA: Diagnosis present

## 2022-02-27 DIAGNOSIS — I5043 Acute on chronic combined systolic (congestive) and diastolic (congestive) heart failure: Secondary | ICD-10-CM | POA: Diagnosis present

## 2022-02-27 DIAGNOSIS — Z1152 Encounter for screening for COVID-19: Secondary | ICD-10-CM | POA: Diagnosis not present

## 2022-02-27 DIAGNOSIS — J159 Unspecified bacterial pneumonia: Secondary | ICD-10-CM | POA: Diagnosis present

## 2022-02-27 DIAGNOSIS — Z79899 Other long term (current) drug therapy: Secondary | ICD-10-CM | POA: Diagnosis not present

## 2022-02-27 DIAGNOSIS — E1122 Type 2 diabetes mellitus with diabetic chronic kidney disease: Secondary | ICD-10-CM | POA: Diagnosis present

## 2022-02-27 DIAGNOSIS — Z923 Personal history of irradiation: Secondary | ICD-10-CM | POA: Diagnosis not present

## 2022-02-27 DIAGNOSIS — I10 Essential (primary) hypertension: Secondary | ICD-10-CM

## 2022-02-27 DIAGNOSIS — E1165 Type 2 diabetes mellitus with hyperglycemia: Secondary | ICD-10-CM | POA: Diagnosis present

## 2022-02-27 LAB — GLUCOSE, CAPILLARY
Glucose-Capillary: 214 mg/dL — ABNORMAL HIGH (ref 70–99)
Glucose-Capillary: 220 mg/dL — ABNORMAL HIGH (ref 70–99)
Glucose-Capillary: 228 mg/dL — ABNORMAL HIGH (ref 70–99)
Glucose-Capillary: 250 mg/dL — ABNORMAL HIGH (ref 70–99)

## 2022-02-27 LAB — COMPREHENSIVE METABOLIC PANEL
ALT: 155 U/L — ABNORMAL HIGH (ref 0–44)
AST: 147 U/L — ABNORMAL HIGH (ref 15–41)
Albumin: 2.7 g/dL — ABNORMAL LOW (ref 3.5–5.0)
Alkaline Phosphatase: 85 U/L (ref 38–126)
Anion gap: 9 (ref 5–15)
BUN: 55 mg/dL — ABNORMAL HIGH (ref 8–23)
CO2: 28 mmol/L (ref 22–32)
Calcium: 8.6 mg/dL — ABNORMAL LOW (ref 8.9–10.3)
Chloride: 96 mmol/L — ABNORMAL LOW (ref 98–111)
Creatinine, Ser: 2.25 mg/dL — ABNORMAL HIGH (ref 0.44–1.00)
GFR, Estimated: 22 mL/min — ABNORMAL LOW (ref >60)
Glucose, Bld: 184 mg/dL — ABNORMAL HIGH (ref 70–99)
Potassium: 4.5 mmol/L (ref 3.5–5.1)
Sodium: 133 mmol/L — ABNORMAL LOW (ref 135–145)
Total Bilirubin: 0.8 mg/dL (ref 0.3–1.2)
Total Protein: 5.7 g/dL — ABNORMAL LOW (ref 6.5–8.1)

## 2022-02-27 LAB — PROCALCITONIN: Procalcitonin: 5.43 ng/mL

## 2022-02-27 LAB — CBC
HCT: 27.6 % — ABNORMAL LOW (ref 36.0–46.0)
Hemoglobin: 9.3 g/dL — ABNORMAL LOW (ref 12.0–15.0)
MCH: 32 pg (ref 26.0–34.0)
MCHC: 33.7 g/dL (ref 30.0–36.0)
MCV: 94.8 fL (ref 80.0–100.0)
Platelets: 157 10*3/uL (ref 150–400)
RBC: 2.91 MIL/uL — ABNORMAL LOW (ref 3.87–5.11)
RDW: 14.8 % (ref 11.5–15.5)
WBC: 8.3 10*3/uL (ref 4.0–10.5)
nRBC: 0 % (ref 0.0–0.2)

## 2022-02-27 LAB — ECHOCARDIOGRAM COMPLETE
Area-P 1/2: 4.49 cm2
Height: 63.504 in
S' Lateral: 3.9 cm
Weight: 1883.61 oz

## 2022-02-27 LAB — EXPECTORATED SPUTUM ASSESSMENT W GRAM STAIN, RFLX TO RESP C: Special Requests: NORMAL

## 2022-02-27 LAB — CBG MONITORING, ED: Glucose-Capillary: 170 mg/dL — ABNORMAL HIGH (ref 70–99)

## 2022-02-27 LAB — MRSA NEXT GEN BY PCR, NASAL: MRSA by PCR Next Gen: DETECTED — AB

## 2022-02-27 MED ORDER — ONDANSETRON HCL 4 MG/2ML IJ SOLN: 4.0000 mg | Freq: Four times a day (QID) | INTRAMUSCULAR | Status: DC | PRN

## 2022-02-27 MED ORDER — IPRATROPIUM-ALBUTEROL 0.5-2.5 (3) MG/3ML IN SOLN
3.0000 mL | RESPIRATORY_TRACT | Status: DC | PRN
  Administered 2022-03-02 – 2022-03-03 (×2): 3 mL via RESPIRATORY_TRACT
  Filled 2022-02-27 (×2): qty 3

## 2022-02-27 MED ORDER — ORAL CARE MOUTH RINSE: 15.0000 mL | OROMUCOSAL | Status: DC

## 2022-02-27 MED ORDER — ORAL CARE MOUTH RINSE: 15.0000 mL | OROMUCOSAL | Status: DC | PRN

## 2022-02-27 MED ORDER — ONDANSETRON HCL 4 MG PO TABS: 4.0000 mg | ORAL_TABLET | Freq: Four times a day (QID) | ORAL | Status: DC | PRN

## 2022-02-27 MED ORDER — INSULIN NPH (HUMAN) (ISOPHANE) 100 UNIT/ML ~~LOC~~ SUSP
10.0000 [IU] | Freq: Two times a day (BID) | SUBCUTANEOUS | Status: DC
  Administered 2022-02-27 – 2022-03-03 (×9): 10 [IU] via SUBCUTANEOUS
  Filled 2022-02-27 (×2): qty 10

## 2022-02-27 MED ORDER — ACETAMINOPHEN 650 MG RE SUPP: 650.0000 mg | Freq: Four times a day (QID) | RECTAL | Status: DC | PRN

## 2022-02-27 MED ORDER — INSULIN ASPART 100 UNIT/ML IJ SOLN
0.0000 [IU] | Freq: Three times a day (TID) | INTRAMUSCULAR | Status: DC
  Administered 2022-02-27 (×3): 5 [IU] via SUBCUTANEOUS
  Administered 2022-02-28: 3 [IU] via SUBCUTANEOUS
  Administered 2022-02-28 (×2): 5 [IU] via SUBCUTANEOUS
  Administered 2022-03-01: 3 [IU] via SUBCUTANEOUS
  Administered 2022-03-01: 5 [IU] via SUBCUTANEOUS
  Administered 2022-03-02: 3 [IU] via SUBCUTANEOUS
  Administered 2022-03-02: 2 [IU] via SUBCUTANEOUS
  Administered 2022-03-03: 3 [IU] via SUBCUTANEOUS
  Administered 2022-03-03: 2 [IU] via SUBCUTANEOUS

## 2022-02-27 MED ORDER — ACETAMINOPHEN 325 MG PO TABS: 650.0000 mg | ORAL_TABLET | Freq: Four times a day (QID) | ORAL | Status: DC | PRN

## 2022-02-27 MED ORDER — SODIUM CHLORIDE 0.9 % IV SOLN
500.0000 mg | INTRAVENOUS | Status: DC
  Administered 2022-02-27: 500 mg via INTRAVENOUS
  Filled 2022-02-27: qty 5

## 2022-02-27 MED ORDER — INSULIN ASPART 100 UNIT/ML IJ SOLN
0.0000 [IU] | Freq: Every day | INTRAMUSCULAR | Status: DC
  Administered 2022-02-27 – 2022-03-02 (×3): 2 [IU] via SUBCUTANEOUS

## 2022-02-27 MED ORDER — SODIUM CHLORIDE 0.9 % IV SOLN
1.0000 g | INTRAVENOUS | Status: AC
  Administered 2022-02-27 – 2022-03-02 (×4): 1 g via INTRAVENOUS
  Filled 2022-02-27 (×5): qty 10

## 2022-02-27 MED ORDER — CHLORHEXIDINE GLUCONATE CLOTH 2 % EX PADS
6.0000 | MEDICATED_PAD | Freq: Every day | CUTANEOUS | Status: DC
  Administered 2022-02-27 – 2022-02-28 (×2): 6 via TOPICAL

## 2022-02-27 MED ORDER — ENOXAPARIN SODIUM 30 MG/0.3ML IJ SOSY
30.0000 mg | PREFILLED_SYRINGE | INTRAMUSCULAR | Status: DC
  Administered 2022-02-27 – 2022-03-04 (×6): 30 mg via SUBCUTANEOUS
  Filled 2022-02-27 (×6): qty 0.3

## 2022-02-27 MED ORDER — MUPIROCIN 2 % EX OINT
TOPICAL_OINTMENT | Freq: Two times a day (BID) | CUTANEOUS | Status: DC
  Administered 2022-02-28 – 2022-03-02 (×4): 1 via NASAL
  Filled 2022-02-27 (×3): qty 22

## 2022-02-27 MED ORDER — PREDNISONE 20 MG PO TABS
40.0000 mg | ORAL_TABLET | Freq: Every day | ORAL | Status: DC
  Administered 2022-02-27 – 2022-02-28 (×2): 40 mg via ORAL
  Filled 2022-02-27 (×2): qty 2

## 2022-02-27 NOTE — Plan of Care (Signed)
  Problem: Education: Goal: Knowledge of General Education information will improve Description: Including pain rating scale, medication(s)/side effects and non-pharmacologic comfort measures Outcome: Progressing   Problem: Health Behavior/Discharge Planning: Goal: Ability to manage health-related needs will improve Outcome: Progressing   Problem: Clinical Measurements: Goal: Ability to maintain clinical measurements within normal limits will improve Outcome: Progressing Goal: Will remain free from infection Outcome: Progressing Goal: Diagnostic test results will improve Outcome: Progressing Goal: Respiratory complications will improve Outcome: Progressing Goal: Cardiovascular complication will be avoided Outcome: Progressing   Problem: Activity: Goal: Risk for activity intolerance will decrease Outcome: Progressing   Problem: Nutrition: Goal: Adequate nutrition will be maintained Outcome: Progressing   Problem: Coping: Goal: Level of anxiety will decrease Outcome: Progressing   Problem: Elimination: Goal: Will not experience complications related to bowel motility Outcome: Progressing Goal: Will not experience complications related to urinary retention Outcome: Progressing   Problem: Pain Managment: Goal: General experience of comfort will improve Outcome: Progressing   Problem: Safety: Goal: Ability to remain free from injury will improve Outcome: Progressing   Problem: Skin Integrity: Goal: Risk for impaired skin integrity will decrease Outcome: Progressing   Problem: Nutrition Goal: Nutritional status is improving Description: Monitor and assess patient for malnutrition (ex- brittle hair, bruises, dry skin, pale skin and conjunctiva, muscle wasting, smooth red tongue, and disorientation). Collaborate with interdisciplinary team and initiate plan and interventions as ordered.  Monitor patient's weight and dietary intake as ordered or per policy. Utilize  nutrition screening tool and intervene per policy. Determine patient's food preferences and provide high-protein, high-caloric foods as appropriate.  Outcome: Progressing   Problem: Education: Goal: Ability to demonstrate management of disease process will improve Outcome: Progressing Goal: Individualized Educational Video(s) Outcome: Progressing   Problem: Activity: Goal: Capacity to carry out activities will improve Outcome: Progressing   Problem: Cardiac: Goal: Ability to achieve and maintain adequate cardiopulmonary perfusion will improve Outcome: Progressing

## 2022-02-27 NOTE — Assessment & Plan Note (Signed)
Hold home 70/30 NPH 10u BID SSI Mod scale AC/HS

## 2022-02-27 NOTE — Progress Notes (Signed)
  Transition of Care [facility]) Screening Note   Patient Details  Name: [name] Date of Birth: Jan 17, 1946   [facility]) CM/SW Contact:    Roseanne Kaufman, RN Phone Number: 02/27/2022, 3:51 PM    Transition of Care Department [facility]) has reviewed patient and no TOC needs have been identified at this time. We will continue to monitor patient advancement through interdisciplinary progression rounds. If new patient transition needs arise, please place a TOC consult.

## 2022-02-27 NOTE — Assessment & Plan Note (Signed)
Mild transaminitis today DDx includes RSV transaminitis, shock liver, cholecystitis, or other cause Cholecystitis not called on CT scan Repeat CMP in AM to see where this is trending.

## 2022-02-27 NOTE — Progress Notes (Signed)
Patient seen and examined.  Admitted early morning hours by nighttime hospitalist.  See H&P and assessment plan.  77 year old with nonocclusive CAD, nonischemic cardiomyopathy with recent ejection fraction 55%, hypertension type 2 diabetes and CKD stage IIIb with baseline creatinine about 1.5 presented with about 4 days of cough congestion, drowsy and sleeping more.  Poor oral intake.  At urgent care, 86% on room air.  Sent to ER.  Found to have AKI, positive for RSV, procalcitonin more than 5.  Plan: Gentle IV fluids, repeat echocardiogram with elevated BNP.  Recheck renal functions. Holding Lasix and Entresto. Steroids, inhalers, chest physiotherapy and mobility with PT OT.   Total time spent: 35 minutes.  No charge visit.  Same-day admit.

## 2022-02-27 NOTE — Assessment & Plan Note (Signed)
Probably pre-renal ATN in setting of RSV, low BPs in ED. Cardiogenic shock / cardiorenal syndrome is not excluded at this point, though seems less likely given improvement in BP with IVF, physical exam, etc. Got 1L IVF in ED Given BNP > 4500 will hold off on further IVF for the moment Hold lasix and entresto Hold coreg Strict intake and output Repeat CMP in AM

## 2022-02-27 NOTE — Assessment & Plan Note (Signed)
Looks like creat about 1.5 at baseline.

## 2022-02-27 NOTE — Progress Notes (Signed)
PCT positive.  Will continue CAP ABx of rocephin + azithromycin for possible superimposed bacterial infection for the moment.

## 2022-02-27 NOTE — Assessment & Plan Note (Addendum)
Pt with RSV infection causing respiratory symptoms. DDx includes CHF, bacterial PNA, and other causes Continue steroids with daily prednisone Adult wheeze protocol PRN duonebs Check procalcitonin, bacterial PNA lower on differential however: WBC neg No SIRS Got 1 dose of ABx in ED anyhow Checking 2d echo given the BNP elevation, see CHF discussion below

## 2022-02-27 NOTE — H&P (Signed)
History and Physical    Patient: [name] QBH:[id number] DOB: Sep 24, 1945 DOA: 02/26/2022 DOS: the patient was seen and examined on 02/27/2022 PCP: Stephani Police, MD  Patient coming from: Home  Chief Complaint:  Chief Complaint  Patient presents with   Shortness of Breath   HPI: [name] is a 77 y.o. female with medical history significant of non-occlusive CAD, NICM prior EF as low as 20% before recovering to 50-55% as of 2022 echo, HTN, DM2, CKD 3.  Pt presents to ED, began feeling ill 4 days ago with cough, congestion.  Then became drowsy and increased sleeping.  Very poor PO intake for past 2 days per daughter.  Pt in to UC with 86% O2 sat, so in to ED.   Review of Systems: As mentioned in the history of present illness. All other systems reviewed and are negative. Past Medical History:  Diagnosis Date   Breast cancer ([place])    breast - left    Chronic diastolic CHF (congestive heart failure), NYHA class 2 ([place])    a. cMRI 5/10: EF 38% // b. Echo 3/13/: EF 50-55, Gr 1 DD // c. Echo 8/14: EF 40, inf/inf-septal HK, Gr 1 DD, mild MR // d. Echo 5/17: EF 40-45, inf HK, Gr 1 DD, mild MR, mild LAE, reduced RVSF, mild TR, PASP 33 // e. Echo 10/17: EF 40-45, Gr 2 DD, mild MR, severe LAE; => 09/2020 EF 50-55%, GrII DD.   Coronary artery disease, non-occlusive    a. nonobs by LHC in 2010 // b. Myoview 10/17: Lg infarct apex, distal ant and lat walls, no ischemia, EF 38; int risk  // c. [place] 10/17:  dLAD 15, pLCx 81, mRCA 40, LVEDP 15   Diabetes mellitus    NICM (nonischemic cardiomyopathy) ([place])    Past Surgical History:  Procedure Laterality Date   APPENDECTOMY     BREAST LUMPECTOMY     lt breast   CARDIAC CATHETERIZATION N/A 12/20/2015   Procedure: Left Heart Cath and Coronary Angiography;  Surgeon: Nelva Bush, MD:: Mild to moderate, nonobstructive coronary artery disease, including 15% distal LAD stenosis, 30% ulcerated mid LCx disease, and 40% mid RCA stenosis.  Upper normal  left ventricular filling pressure (LVEDP 15 mmHg).   RIGHT HEART CATH N/A 05/08/2016   Procedure: Right Heart Cath;  Surgeon: Jolaine Artist, MD;  Location: [facility];; RA = 3, RV = 34/5, PA = 38/12 (21), PCW = 12; Fick cardiac output/index = 4.6/2.6; PVR = 1.9 WU, Ao sat = 96%; PA sat = 58%, 62%    TRANSTHORACIC ECHOCARDIOGRAM  11/2015   a) 11/2015: EF 40 and 45%.  GRII DD; b) 04/2016: EF 20% with grade 2 diastolic dysfunction   TRANSTHORACIC ECHOCARDIOGRAM  10/2016   a) EF 55-60%.  Normal LV function.  Mild DD.Marland Kitchen  Normal valve Fxn.; b) 09/2020: EF 50 to 55%.  Normal function.  No R WMA.  Mild LVH.  GR 2 DD with elevated LAP.-Mild LA dilation.  Moderately elevated PAP estimated RVSP 47 mmHg, and RAP 8 mmHg.   TRIGGER FINGER RELEASE Left 04/23/2017   Procedure: RELEASE TRIGGER FINGER TO LEFT FORTH FINGER;  Surgeon: Cristine Polio, MD;  Location: [facility];  Service: Plastics;  Laterality: Left;   Social History:  reports that she quit smoking about 9 years ago. Her smoking use included cigarettes. She has never used smokeless tobacco. She reports that she does not drink alcohol and does not use drugs.  Allergies  Allergen Reactions   Tape Rash    Pt can tolerate the paper tape    Family History  Problem Relation Age of Onset   Stroke Father    Diabetes Maternal Grandmother     Prior to Admission medications   Medication Sig Start Date End Date Taking? Authorizing Provider  atorvastatin (LIPITOR) 40 MG tablet Take 1 tablet (40 mg total) by mouth daily. 08/15/21  Yes Sande Rives E, PA-C  carvedilol (COREG) 6.25 MG tablet Take 1 tablet (6.25 mg total) by mouth 2 (two) times daily. 08/15/21  Yes Sande Rives E, PA-C  furosemide (LASIX) 40 MG tablet Take 40 mg in the morning, and 20 mg in the evening. 02/03/22  Yes Lendon Colonel, NP  insulin NPH-regular Human (70-30) 100 UNIT/ML injection 22 units with breakfast, and 12 units with supper. 12/10/20   Yes Renato Shin, MD  sacubitril-valsartan (ENTRESTO) 49-51 MG Take 1 tablet by mouth 2 (two) times daily. 03/01/21  Yes Leonie Man, MD    Physical Exam: Vitals:   02/27/22 0143 02/27/22 0144 02/27/22 0200 02/27/22 0230  BP:   (!) 112/55   Pulse: 60 (!) 59 (!) 58 (!) 53  Resp: 18 19 (!) 22 20  Temp:      TempSrc:      SpO2: 96% 94% 100% 100%  Weight:      Height:       Constitutional: NAD, calm, comfortable Eyes: PERRL, lids and conjunctivae normal ENMT: Mucous membranes are moist. Posterior pharynx clear of any exudate or lesions.Normal dentition.  Neck: normal, supple, no masses, no thyromegaly Respiratory: Few wheezes and crackles throughout. Cardiovascular: Regular rate and rhythm, no murmurs / rubs / gallops. Maybe trace extremity edema. 2+ pedal pulses. No carotid bruits.  Abdomen: no tenderness, no masses palpated. No hepatosplenomegaly. Bowel sounds positive.  Musculoskeletal: no clubbing / cyanosis. No joint deformity upper and lower extremities. Good ROM, no contractures. Normal muscle tone.  Skin: no rashes, lesions, ulcers. No induration Neurologic: CN 2-12 grossly intact. Sensation intact, DTR normal. Strength 5/5 in all 4.  Psychiatric: Normal judgment and insight. Alert and oriented x 3. Normal mood.   Data Reviewed:     RSV positive  COVID and Flu neg     Latest Ref Rng & Units 02/26/2022    5:59 PM 08/15/2021    4:15 PM 09/20/2020   11:49 AM  CBC  WBC 4.0 - 10.5 K/uL 7.4  6.2    Hemoglobin 12.0 - 15.0 g/dL 9.3  12.8  10.2   Hematocrit 36.0 - 46.0 % 27.5  36.9  30.0   Platelets 150 - 400 K/uL 150  220        Latest Ref Rng & Units 02/26/2022    5:59 PM 02/03/2022    4:25 PM 08/26/2021    3:00 PM  CMP  Glucose 70 - 99 mg/dL 349  97  105   BUN 8 - 23 mg/dL 51  40  41   Creatinine 0.44 - 1.00 mg/dL 2.22  1.73  1.45   Sodium 135 - 145 mmol/L 135  139  140   Potassium 3.5 - 5.1 mmol/L 4.2  4.6  5.2   Chloride 98 - 111 mmol/L 94  95  103   CO2 22 - 32  mmol/L '29  27  21   '$ Calcium 8.9 - 10.3 mg/dL 9.3  10.1  9.6   Total Protein 6.5 - 8.1 g/dL 6.5     Total  Bilirubin 0.3 - 1.2 mg/dL 1.2     Alkaline Phos 38 - 126 U/L 100     AST 15 - 41 U/L 164     ALT 0 - 44 U/L 164      BNP > 4500  CXR: IMPRESSION: Nonspecific interstitial prominence consistent with atypical infection, edema or interstitial lung disease. Left mid lung consolidation may represent early bronchopneumonia.   CT AP: IMPRESSION: 1. Hyperdense material in the gallbladder, possible sludge or stones. 2. Coarse calcifications in the tail of the pancreas with distal cystic attenuation, question cyst versus pancreatic ductal dilatation. The pancreas is not well evaluated due to lack of IV contrast. Further evaluation with contrast enhanced CT or MRI is suggested. 3. Mesenteric edema, small amount of free fluid in the pelvis, and anasarca. 4. Cardiomegaly. 5. Aortic atherosclerosis.   Assessment and Plan: * RSV (respiratory syncytial virus pneumonia) Pt with RSV infection causing respiratory symptoms. DDx includes CHF, bacterial PNA, and other causes Continue steroids with daily prednisone Adult wheeze protocol PRN duonebs Check procalcitonin, bacterial PNA lower on differential however: WBC neg No SIRS Got 1 dose of ABx in ED anyhow Checking 2d echo given the BNP elevation, see CHF discussion below  Transaminitis Mild transaminitis today DDx includes RSV transaminitis, shock liver, cholecystitis, or other cause Cholecystitis not called on CT scan Repeat CMP in AM to see where this is trending.  AKI (acute kidney injury) ([place]) Probably pre-renal ATN in setting of RSV, low BPs in ED. Cardiogenic shock / cardiorenal syndrome is not excluded at this point, though seems less likely given improvement in BP with IVF, physical exam, etc. Got 1L IVF in ED Given BNP > 4500 will hold off on further IVF for the moment Hold lasix and entresto Hold coreg Strict intake  and output Repeat CMP in AM  Chronic diastolic heart failure ([place]) Pt with h/o HFrEF in past but recovery of EF to 50-55% on echo in 2022. Pt with Grade 2 DD Unclear if acute CHF present or not: BNP most elevated it has ever been CXR with question of pulm edema But no peripheral edema which she usually gets when she has CHF she reports Does not seem to have orthopnea clinically Getting 2d echo Got 1L IVF in ED Will hold entresto (due to AKI) and lasix for the moment  CKD (chronic kidney disease), stage III ([place]) Looks like creat about 1.5 at baseline.  Essential hypertension Hold home BP meds due to soft BPs in ED.  Although not sure if the 62Z systolic was real or not as pt arrived to [facility] with wrong size BP cuff on.  BP now 122/55  Type 2 diabetes mellitus without complication, with long-term current use of insulin (HCC) Hold home 70/30 NPH 10u BID SSI Mod scale AC/HS      Advance Care Planning:   Code Status: Full Code  Consults: None  Family Communication: No family in room  Severity of Illness: The appropriate patient status for this patient is OBSERVATION. Observation status is judged to be reasonable and necessary in order to provide the required intensity of service to ensure the patient's safety. The patient's presenting symptoms, physical exam findings, and initial radiographic and laboratory data in the context of their medical condition is felt to place them at decreased risk for further clinical deterioration. Furthermore, it is anticipated that the patient will be medically stable for discharge from the hospital within 2 midnights of admission.   Author: Etta Quill., DO  02/27/2022 4:09 AM  For on call review [email or web address].

## 2022-02-27 NOTE — Assessment & Plan Note (Signed)
Pt with h/o HFrEF in past but recovery of EF to 50-55% on echo in 2022. Pt with Grade 2 DD Unclear if acute CHF present or not: BNP most elevated it has ever been CXR with question of pulm edema But no peripheral edema which she usually gets when she has CHF she reports Does not seem to have orthopnea clinically Getting 2d echo Got 1L IVF in ED Will hold entresto (due to AKI) and lasix for the moment

## 2022-02-27 NOTE — Assessment & Plan Note (Addendum)
Hold home BP meds due to soft BPs in ED.  Although not sure if the 45O systolic was real or not as pt arrived to [facility] with wrong size BP cuff on.  BP now 122/55

## 2022-02-27 NOTE — Evaluation (Signed)
Physical Therapy Evaluation Patient Details Name: [name] MRN: [id number] DOB: [birth year] Today's Date: 02/27/2022  History of Present Illness  77 yo female admitted with RSV. Hx of neuropathy, respiratory failure, CHF, COPD, cardiomyopathy, DM, CKD  Clinical Impression  On eval, pt required Min A for mobility. She walked ~25 feet around the room-she didn't use a walker but she needed to hold onto therapist to stabilize herself. Fall risk. O2 93% on RA (removed from [place] O2 during session-replaced [place] O2 end of session). HR up 123 bpm with activity. Will plan to follow and progress activity as tolerated.       Recommendations for follow up therapy are one component of a multi-disciplinary discharge planning process, led by the attending physician.  Recommendations may be updated based on patient status, additional functional criteria and insurance authorization.  Follow Up Recommendations Home health PT      Assistance Recommended at Discharge PRN  Patient can return home with the following  A little help with walking and/or transfers;A little help with bathing/dressing/bathroom;Assistance with cooking/housework;Assist for transportation;Help with stairs or ramp for entrance    Equipment Recommendations Rollator (4 wheels)  Recommendations for Other Services  OT consult    Functional Status Assessment Patient has had a recent decline in their functional status and demonstrates the ability to make significant improvements in function in a reasonable and predictable amount of time.     Precautions / Restrictions Precautions Precautions: Fall Precaution Comments: monitor HR Restrictions Weight Bearing Restrictions: No      Mobility  Bed Mobility Overal bed mobility: Modified Independent                  Transfers Overall transfer level: Needs assistance   Transfers: Sit to/from Stand Sit to Stand: Min assist           General transfer comment: Assist to  steady.    Ambulation/Gait Ambulation/Gait assistance: Min assist Gait Distance (Feet): 25 Feet Assistive device: None (pt held onto therapist) Gait Pattern/deviations: Step-through pattern, Decreased stride length       General Gait Details: Unsteady. Fall risk. Steadiness improved a bit as distance increased but pt still needed to steady herself by holding on to therapist. HR up to 123 bpm, O2 93% on RA, dyspnea 2/4  Stairs            Wheelchair Mobility    Modified Rankin (Stroke Patients Only)       Balance Overall balance assessment: Needs assistance         Standing balance support: During functional activity Standing balance-Leahy Scale: Poor                               Pertinent Vitals/Pain Pain Assessment Pain Assessment: No/denies pain    Home Living Family/patient expects to be discharged to:: Private residence Living Arrangements: Alone Available Help at Discharge: Family;Available PRN/intermittently Type of Home: House Home Access: Stairs to enter   Entrance Stairs-Number of Steps: 1-2   Home Layout: Two level;Able to live on main level with bedroom/bathroom Home Equipment: Rolling Walker (2 wheels);Cane - single point      Prior Function Prior Level of Function : Independent/Modified Independent             Mobility Comments: ind ADLs Comments: family helps with transportation     Hand Dominance        Extremity/Trunk Assessment   Upper Extremity Assessment Upper  Extremity Assessment: Defer to OT evaluation    Lower Extremity Assessment Lower Extremity Assessment: Generalized weakness (hx of neuropathy)    Cervical / Trunk Assessment Cervical / Trunk Assessment: Normal  Communication   Communication: No difficulties  Cognition Arousal/Alertness: Awake/alert Behavior During Therapy: WFL for tasks assessed/performed Overall Cognitive Status: Within Functional Limits for tasks assessed                                           General Comments      Exercises     Assessment/Plan    PT Assessment Patient needs continued PT services  PT Problem List Decreased strength;Decreased activity tolerance;Decreased knowledge of use of DME;Decreased balance;Decreased mobility       PT Treatment Interventions DME instruction;Gait training;Therapeutic exercise;Balance training;Functional mobility training;Therapeutic activities;Patient/family education    PT Goals (Current goals can be found in the Care Plan section)  Acute Rehab PT Goals Patient Stated Goal: regain PLOF/independence PT Goal Formulation: With patient Time For Goal Achievement: 03/13/22 Potential to Achieve Goals: Good    Frequency Min 3X/week     Co-evaluation               AM-PAC PT "6 Clicks" Mobility  Outcome Measure Help needed turning from your back to your side while in a flat bed without using bedrails?: None Help needed moving from lying on your back to sitting on the side of a flat bed without using bedrails?: None Help needed moving to and from a bed to a chair (including a wheelchair)?: A Little Help needed standing up from a chair using your arms (e.g., wheelchair or bedside chair)?: A Little Help needed to walk in hospital room?: A Little Help needed climbing 3-5 steps with a railing? : A Little 6 Click Score: 20    End of Session Equipment Utilized During Treatment: Gait belt Activity Tolerance: Patient tolerated treatment well (limited by increaese in HR) Patient left: in bed;with call bell/phone within reach;with bed alarm set (no recliner available in room)   PT Visit Diagnosis: Unsteadiness on feet (R26.81);Difficulty in walking, not elsewhere classified (R26.2)    Time: 1441-1510 PT Time Calculation (min) (ACUTE ONLY): 29 min   Charges:   PT Evaluation $PT Eval Low Complexity: 1 Low PT Treatments $Gait Training: 8-22 mins          Doreatha Massed, PT Acute  Rehabilitation  Office: [phone]

## 2022-02-27 NOTE — Plan of Care (Addendum)
  X-cover Note: Secure chat message received from EDP. Pt now hypotensive with SBP in the 80s. IVF started. Bed status changed to [facility] ICU. Discussed with EDP that if pt remain hypotensive despite IVF or needs vasopressors, that PCCM will need to consulted for ICU admission.   Kristopher Oppenheim, DO [facility]

## 2022-02-27 NOTE — Progress Notes (Signed)
  Echocardiogram 2D Echocardiogram has been performed.  [name] M 02/27/2022, 1:51 PM

## 2022-02-27 NOTE — Progress Notes (Signed)
Family has been updated on pt status and current location

## 2022-02-28 ENCOUNTER — Encounter: Payer: Self-pay | Admitting: Cardiology

## 2022-02-28 DIAGNOSIS — I429 Cardiomyopathy, unspecified: Secondary | ICD-10-CM | POA: Diagnosis not present

## 2022-02-28 DIAGNOSIS — I5020 Unspecified systolic (congestive) heart failure: Secondary | ICD-10-CM

## 2022-02-28 DIAGNOSIS — J121 Respiratory syncytial virus pneumonia: Secondary | ICD-10-CM | POA: Diagnosis not present

## 2022-02-28 LAB — CBC WITH DIFFERENTIAL/PLATELET
Abs Immature Granulocytes: 0.04 10*3/uL (ref 0.00–0.07)
Basophils Absolute: 0 10*3/uL (ref 0.0–0.1)
Basophils Relative: 0 %
Eosinophils Absolute: 0 10*3/uL (ref 0.0–0.5)
Eosinophils Relative: 0 %
HCT: 28.3 % — ABNORMAL LOW (ref 36.0–46.0)
Hemoglobin: 9.3 g/dL — ABNORMAL LOW (ref 12.0–15.0)
Immature Granulocytes: 1 %
Lymphocytes Relative: 10 %
Lymphs Abs: 0.8 10*3/uL (ref 0.7–4.0)
MCH: 30.6 pg (ref 26.0–34.0)
MCHC: 32.9 g/dL (ref 30.0–36.0)
MCV: 93.1 fL (ref 80.0–100.0)
Monocytes Absolute: 0.7 10*3/uL (ref 0.1–1.0)
Monocytes Relative: 9 %
Neutro Abs: 6.7 10*3/uL (ref 1.7–7.7)
Neutrophils Relative %: 80 %
Platelets: 197 10*3/uL (ref 150–400)
RBC: 3.04 MIL/uL — ABNORMAL LOW (ref 3.87–5.11)
RDW: 14.7 % (ref 11.5–15.5)
WBC: 8.3 10*3/uL (ref 4.0–10.5)
nRBC: 0 % (ref 0.0–0.2)

## 2022-02-28 LAB — BASIC METABOLIC PANEL
Anion gap: 9 (ref 5–15)
BUN: 71 mg/dL — ABNORMAL HIGH (ref 8–23)
CO2: 29 mmol/L (ref 22–32)
Calcium: 8.8 mg/dL — ABNORMAL LOW (ref 8.9–10.3)
Chloride: 97 mmol/L — ABNORMAL LOW (ref 98–111)
Creatinine, Ser: 2.07 mg/dL — ABNORMAL HIGH (ref 0.44–1.00)
GFR, Estimated: 24 mL/min — ABNORMAL LOW (ref >60)
Glucose, Bld: 161 mg/dL — ABNORMAL HIGH (ref 70–99)
Potassium: 4 mmol/L (ref 3.5–5.1)
Sodium: 135 mmol/L (ref 135–145)

## 2022-02-28 LAB — GLUCOSE, CAPILLARY
Glucose-Capillary: 162 mg/dL — ABNORMAL HIGH (ref 70–99)
Glucose-Capillary: 246 mg/dL — ABNORMAL HIGH (ref 70–99)
Glucose-Capillary: 210 mg/dL — ABNORMAL HIGH (ref 70–99)
Glucose-Capillary: 233 mg/dL — ABNORMAL HIGH (ref 70–99)

## 2022-02-28 LAB — HEMOGLOBIN A1C
Hgb A1c MFr Bld: 13.3 % — ABNORMAL HIGH (ref 4.8–5.6)
Mean Plasma Glucose: 335 mg/dL

## 2022-02-28 LAB — MAGNESIUM: Magnesium: 2.2 mg/dL (ref 1.7–2.4)

## 2022-02-28 MED ORDER — GUAIFENESIN-DM 100-10 MG/5ML PO SYRP
5.0000 mL | ORAL_SOLUTION | ORAL | Status: DC | PRN
  Administered 2022-02-28 – 2022-03-03 (×7): 5 mL via ORAL
  Filled 2022-02-28 (×7): qty 10

## 2022-02-28 MED ORDER — GLUCERNA SHAKE PO LIQD
237.0000 mL | Freq: Two times a day (BID) | ORAL | Status: DC
  Administered 2022-02-28 – 2022-03-04 (×7): 237 mL via ORAL
  Filled 2022-02-28 (×10): qty 237

## 2022-02-28 MED ORDER — AZITHROMYCIN 250 MG PO TABS
500.0000 mg | ORAL_TABLET | Freq: Every day | ORAL | Status: AC
  Administered 2022-02-28 – 2022-03-02 (×3): 500 mg via ORAL
  Filled 2022-02-28 (×3): qty 2

## 2022-02-28 MED ORDER — CARVEDILOL 3.125 MG PO TABS
3.1250 mg | ORAL_TABLET | Freq: Two times a day (BID) | ORAL | Status: DC
  Administered 2022-03-01 – 2022-03-04 (×7): 3.125 mg via ORAL
  Filled 2022-02-28 (×8): qty 1

## 2022-02-28 MED ORDER — CARVEDILOL 6.25 MG PO TABS
6.2500 mg | ORAL_TABLET | Freq: Two times a day (BID) | ORAL | Status: DC
  Administered 2022-02-28: 6.25 mg via ORAL
  Filled 2022-02-28: qty 1

## 2022-02-28 MED ORDER — SACUBITRIL-VALSARTAN 49-51 MG PO TABS
1.0000 | ORAL_TABLET | Freq: Two times a day (BID) | ORAL | Status: DC
  Administered 2022-03-01 – 2022-03-04 (×7): 1 via ORAL
  Filled 2022-02-28 (×10): qty 1

## 2022-02-28 MED ORDER — FUROSEMIDE 10 MG/ML IJ SOLN
20.0000 mg | Freq: Once | INTRAMUSCULAR | Status: AC
  Administered 2022-02-28: 20 mg via INTRAVENOUS
  Filled 2022-02-28: qty 2

## 2022-02-28 NOTE — Progress Notes (Signed)
PROGRESS NOTE    [name]  AJG:[id number] DOB: [birth year] DOA: 02/26/2022 PCP: Lujean Amel, MD    Brief Narrative:  77 year old with nonocclusive CAD, nonischemic cardiomyopathy with recent ejection fraction 55%, hypertension, type 2 diabetes and CKD stage IIIb with baseline creatinine about 1.5 presented with about 4 days of cough congestion, drowsy and sleeping more. Poor oral intake. At urgent care, 86% on room air. Sent to ER. Found to have AKI, positive for RSV, procalcitonin more than 5 .  BNP was greatly elevated.  Clinically dehydrated on presentation.   Assessment & Plan:   RSV pneumonia with superadded bacterial pneumonia: Antibiotics to treat bacterial pneumonia, continue Rocephin and azithromycin given very high procalcitonin. Chest physiotherapy, incentive spirometry, deep breathing exercises, sputum induction, mucolytic's and bronchodilators.  Mobilize in the hallway. Sputum cultures, blood cultures, Legionella and streptococcal antigen pending. Supplemental oxygen to keep saturations more than 90%. Short course of prednisone therapy.  Acute kidney injury with history of CKD stage IIIb: Recent baseline creatinine of 1.5-2.  Presented with clinical dehydration.  Treated with IV fluids and now stopped. Creatinine 2.25-2.07 at about baseline today.  Discontinue further IV fluids.  Will discuss with cardiology before restarting on diuretics.  Acute on chronic combined heart failure: History of nonischemic cardiomyopathy with ejection fraction 20%, improved to 55%. Echocardiogram with ejection fraction 35 to 35%, grade 3 diastolic dysfunction. BNP more than 4500 on presentation.  Some clinical stabilization of renal functions today. Patient is on carvedilol, Lasix 40 mg in the morning and 20 mg in the evening, Entresto 49-51 at home.  Will resume carvedilol.  Will consult cardiology before resuming Lasix due to significant drop in ejection fraction.  Type 2 diabetes on  insulin: Uncontrolled with hyperglycemia.  Hemoglobin A1c 13.  On NPH insulin at home.  Continued.  Anticipate fluctuating levels due to poor appetite.  Avoid hypoglycemia.  Debility and deconditioning: Work with PT OT.  Anticipate home with home health PT OT.   DVT prophylaxis: enoxaparin (LOVENOX) injection 30 mg Start: 02/27/22 1000   Code Status: Full code Family Communication: Daughter Sharyn Lull on the phone Disposition Plan: Status is: Inpatient Remains inpatient appropriate because: Significant shortness of breath.  Acute renal failure and acute heart failure.     Consultants:  Cardiology  Procedures:  None  Antimicrobials:  Rocephin and azithromycin 1/7---   Subjective: Patient seen and examined.  No overnight events.  At rest she denied any shortness of breath.  Still feels very weak to walk around.  Without evidence of orthopnea or PND.  Dry cough.  Afebrile.  Still on supplemental oxygen but variable.  Appetite is coming back.  Objective: Vitals:   02/28/22 0000 02/28/22 0350 02/28/22 0400 02/28/22 0800  BP:   (!) 145/68 134/69  Pulse: (!) 59 61 (!) 58 (!) 56  Resp: '19 16 10 17  '$ Temp:  98.6 F (37 C)  98.3 F (36.8 C)  TempSrc:    Oral  SpO2: 98% 99% 100% 100%  Weight:      Height:        Intake/Output Summary (Last 24 hours) at 02/28/2022 1040 Last data filed at 02/28/2022 0800 Gross per 24 hour  Intake 310.24 ml  Output 450 ml  Net -139.76 ml   Filed Weights   02/26/22 1742 02/27/22 0400  Weight: 52.2 kg 53.4 kg    Examination:  General exam: Appears calm and comfortable  Alert oriented x 4.  Frail and debilitated.  Not in any distress. Respiratory  system: Clear to auscultation. Respiratory effort normal.  Occasional basal crackles.  On minimal oxygen. SpO2: 100 % O2 Flow Rate (L/min): 2 L/min FiO2 (%): 36 %  Cardiovascular system: S1 & S2 heard, RRR.  Trace bilateral pedal edema. Gastrointestinal system: Abdomen is nondistended, soft and  nontender. No organomegaly or masses felt. Normal bowel sounds heard. Central nervous system: Alert and oriented. No focal neurological deficits. Extremities: Symmetric 5 x 5 power.     Data Reviewed: I have personally reviewed following labs and imaging studies  CBC: Recent Labs  Lab 02/26/22 1759 02/27/22 0401 02/28/22 0810  WBC 7.4 8.3 8.3  NEUTROABS 5.9  --  6.7  HGB 9.3* 9.3* 9.3*  HCT 27.5* 27.6* 28.3*  MCV 92.6 94.8 93.1  PLT 150 157 409   Basic Metabolic Panel: Recent Labs  Lab 02/26/22 1759 02/27/22 0401 02/28/22 0810  NA 135 133* 135  K 4.2 4.5 4.0  CL 94* 96* 97*  CO2 '29 28 29  '$ GLUCOSE 349* 184* 161*  BUN 51* 55* 71*  CREATININE 2.22* 2.25* 2.07*  CALCIUM 9.3 8.6* 8.8*   GFR: Estimated Creatinine Clearance: 18.9 mL/min (A) (by C-G formula based on SCr of 2.07 mg/dL (H)). Liver Function Tests: Recent Labs  Lab 02/26/22 1759 02/27/22 0401  AST 164* 147*  ALT 164* 155*  ALKPHOS 100 85  BILITOT 1.2 0.8  PROT 6.5 5.7*  ALBUMIN 3.7 2.7*   No results for input(s): "LIPASE", "AMYLASE" in the last 168 hours. No results for input(s): "AMMONIA" in the last 168 hours. Coagulation Profile: No results for input(s): "INR", "PROTIME" in the last 168 hours. Cardiac Enzymes: No results for input(s): "CKTOTAL", "CKMB", "CKMBINDEX", "TROPONINI" in the last 168 hours. BNP (last 3 results) No results for input(s): "PROBNP" in the last 8760 hours. HbA1C: Recent Labs    02/27/22 0401  HGBA1C 13.3*   CBG: Recent Labs  Lab 02/27/22 0801 02/27/22 1302 02/27/22 1722 02/27/22 2129 02/28/22 0822  GLUCAP 214* 220* 228* 250* 162*   Lipid Profile: No results for input(s): "CHOL", "HDL", "LDLCALC", "TRIG", "CHOLHDL", "LDLDIRECT" in the last 72 hours. Thyroid Function Tests: No results for input(s): "TSH", "T4TOTAL", "FREET4", "T3FREE", "THYROIDAB" in the last 72 hours. Anemia Panel: No results for input(s): "VITAMINB12", "FOLATE", "FERRITIN", "TIBC", "IRON",  "RETICCTPCT" in the last 72 hours. Sepsis Labs: Recent Labs  Lab 02/26/22 2200 02/27/22 0401  PROCALCITON  --  5.43  LATICACIDVEN 1.4  --     Recent Results (from the past 240 hour(s))  Resp panel by RT-PCR (RSV, Flu A&B, Covid) Anterior Nasal Swab     Status: Abnormal   Collection Time: 02/26/22  5:59 PM   Specimen: Anterior Nasal Swab  Result Value Ref Range Status   SARS Coronavirus 2 by RT PCR NEGATIVE NEGATIVE Final    Comment: (NOTE) SARS-CoV-2 target nucleic acids are NOT DETECTED.  The SARS-CoV-2 RNA is generally detectable in upper respiratory specimens during the acute phase of infection. The lowest concentration of SARS-CoV-2 viral copies this assay can detect is 138 copies/mL. A negative result does not preclude SARS-Cov-2 infection and should not be used as the sole basis for treatment or other patient management decisions. A negative result may occur with  improper specimen collection/handling, submission of specimen other than nasopharyngeal swab, presence of viral mutation(s) within the areas targeted by this assay, and inadequate number of viral copies(<138 copies/mL). A negative result must be combined with clinical observations, patient history, and epidemiological information. The expected result is Negative.  Fact Sheet for Patients:  EntrepreneurPulse.com.au  Fact Sheet for Healthcare Providers:  IncredibleEmployment.be  This test is no t yet approved or cleared by the [place] FDA and  has been authorized for detection and/or diagnosis of SARS-CoV-2 by FDA under an Emergency Use Authorization (EUA). This EUA will remain  in effect (meaning this test can be used) for the duration of the COVID-19 declaration under Section 564(b)(1) of the Act, 21 U.S.C.section 360bbb-3(b)(1), unless the authorization is terminated  or revoked sooner.       Influenza A by PCR NEGATIVE NEGATIVE Final   Influenza B by PCR  NEGATIVE NEGATIVE Final    Comment: (NOTE) The Xpert Xpress SARS-CoV-2/FLU/RSV plus assay is intended as an aid in the diagnosis of influenza from Nasopharyngeal swab specimens and should not be used as a sole basis for treatment. Nasal washings and aspirates are unacceptable for Xpert Xpress SARS-CoV-2/FLU/RSV testing.  Fact Sheet for Patients: EntrepreneurPulse.com.au  Fact Sheet for Healthcare Providers: IncredibleEmployment.be  This test is not yet approved or cleared by the [place] FDA and has been authorized for detection and/or diagnosis of SARS-CoV-2 by FDA under an Emergency Use Authorization (EUA). This EUA will remain in effect (meaning this test can be used) for the duration of the COVID-19 declaration under Section 564(b)(1) of the Act, 21 U.S.C. section 360bbb-3(b)(1), unless the authorization is terminated or revoked.     Resp Syncytial Virus by PCR POSITIVE (A) NEGATIVE Final    Comment: (NOTE) Fact Sheet for Patients: EntrepreneurPulse.com.au  Fact Sheet for Healthcare Providers: IncredibleEmployment.be  This test is not yet approved or cleared by the [place] FDA and has been authorized for detection and/or diagnosis of SARS-CoV-2 by FDA under an Emergency Use Authorization (EUA). This EUA will remain in effect (meaning this test can be used) for the duration of the COVID-19 declaration under Section 564(b)(1) of the Act, 21 U.S.C. section 360bbb-3(b)(1), unless the authorization is terminated or revoked.  Performed at [facility], [address] [place], [place] [address]   Culture, blood (routine x 2)     Status: None (Preliminary result)   Collection Time: 02/26/22 10:00 PM   Specimen: BLOOD  Result Value Ref Range Status   Specimen Description   Final    BLOOD BLOOD RIGHT ARM Performed at [facility], [address]  [place], [place] [address]    Special Requests   Final    BOTTLES DRAWN AEROBIC AND ANAEROBIC Blood Culture adequate volume Performed at [facility], [address] [place], [place] [address]    Culture   Final    NO GROWTH < 24 HOURS Performed at [facility] [address] [place], [place] [address]    Report Status PENDING  Incomplete  Culture, blood (routine x 2)     Status: None (Preliminary result)   Collection Time: 02/26/22 10:04 PM   Specimen: BLOOD  Result Value Ref Range Status   Specimen Description   Final    BLOOD LEFT ANTECUBITAL Performed at [facility], [address], [place], [place] [address]    Special Requests   Final    BOTTLES DRAWN AEROBIC AND ANAEROBIC Blood Culture results may not be optimal due to an excessive volume of blood received in culture bottles Performed at [facility], [address], [place], [place] [address]    Culture   Final    NO GROWTH < 24 HOURS Performed at [facility], [place]  [address], [place], [place] [address]    Report Status PENDING  Incomplete  MRSA Next Gen by PCR, Nasal     Status: Abnormal   Collection Time: 02/27/22  1:46 AM   Specimen: Nasal Mucosa; Nasal Swab  Result Value Ref Range Status   MRSA by PCR Next Gen DETECTED (A) NOT DETECTED Final    Comment: CRITICAL RESULT CALLED TO, READ BACK BY AND VERIFIED WITH: WILLIS,S AT 7124 ON 02/27/22 BY LUZOLOP (NOTE) The GeneXpert MRSA Assay (FDA approved for NASAL specimens only), is one component of a comprehensive MRSA colonization surveillance program. It is not intended to diagnose MRSA infection nor to guide or monitor treatment for MRSA infections. Test performance is not FDA approved in patients less than 29 years old. Performed at [facility], [place] [address], [place], [place] [address]   Expectorated Sputum Assessment w Gram Stain, Rflx to Resp Cult     Status: None    Collection Time: 02/27/22 12:41 PM   Specimen: Sputum  Result Value Ref Range Status   Specimen Description SPUTUM  Final   Special Requests Normal  Final   Sputum evaluation   Final    THIS SPECIMEN IS ACCEPTABLE FOR SPUTUM CULTURE Performed at [facility], [place] [address] [place], [place] [address]    Report Status 02/27/2022 FINAL  Final  Culture, Respiratory w Gram Stain     Status: None (Preliminary result)   Collection Time: 02/27/22 12:41 PM   Specimen: SPU  Result Value Ref Range Status   Specimen Description   Final    SPUTUM Performed at [facility] [address] [place], [place] [address]    Special Requests   Final    Normal Reflexed from [phone] Performed at [facility], [place] [address] [place], [place] [address]    Gram Stain   Final    NO WBC SEEN FEW GRAM POSITIVE COCCI IN PAIRS RARE GRAM POSITIVE RODS Performed at [facility], [place] [address], [place], [place] [address]    Culture PENDING  Incomplete   Report Status PENDING  Incomplete         Radiology Studies: ECHOCARDIOGRAM COMPLETE  Result Date: 02/27/2022    ECHOCARDIOGRAM REPORT   Patient Name:   [name] Date of Exam: 02/27/2022 Medical Rec #:  [id number]    Height:       63.5 in Accession #:    [id number]   Weight:       117.7 lb Date of Birth:  1945-06-01    BSA:          1.553 m Patient Age:    55 years     BP:           150/84 mmHg Patient Gender: F            HR:           61 bpm. Exam Location:  Inpatient Procedure: 3D Echo, 2D Echo, Color Doppler and Cardiac Doppler Indications:    CHF-Acute Diastolic L79.89  History:        Patient has prior history of Echocardiogram examinations, most                 recent 09/20/2020. CHF and Cardiomyopathy, CAD; Risk                 Factors:Diabetes. Past history of breast cancer.  Sonographer:    [place] Referring Phys: [phone]  [place]  1. Left ventricular ejection  fraction, by estimation, is 35 to 40%. The left ventricle has moderately decreased function. The left ventricle demonstrates global hypokinesis. The left ventricular internal cavity size was moderately dilated. Left ventricular diastolic parameters are consistent with Grade III diastolic dysfunction (restrictive).  2. Right ventricular systolic function is low normal. The right ventricular size is moderately enlarged. There is mildly elevated pulmonary artery systolic pressure. The estimated right ventricular systolic pressure is 54.0 mmHg.  3. Left atrial size was moderately dilated.  4. Right atrial size was moderately dilated.  5. The mitral valve is degenerative. Mild mitral valve regurgitation. No evidence of mitral stenosis.  6. The aortic valve is tricuspid. Aortic valve regurgitation is not visualized. No aortic stenosis is present.  7. The inferior vena cava is normal in size with <50% respiratory variability, suggesting right atrial pressure of 8 mmHg.  8. Evidence of atrial level shunting detected by color flow Doppler. There is a small patent foramen ovale with predominantly left to right shunting across the atrial septum. Comparison(s): The left ventricular function is worsened. Prior EF 50-55%. FINDINGS  Left Ventricle: Left ventricular ejection fraction, by estimation, is 35 to 40%. The left ventricle has moderately decreased function. The left ventricle demonstrates global hypokinesis. 3D left ventricular ejection fraction analysis performed but not reported based on interpreter judgement due to suboptimal tracking. The left ventricular internal cavity size was moderately dilated. There is no left ventricular hypertrophy. Left ventricular diastolic parameters are consistent with Grade III diastolic dysfunction (restrictive). Right Ventricle: The right ventricular size is moderately enlarged. No increase in right ventricular wall thickness. Right ventricular systolic function is low normal. There is  mildly elevated pulmonary artery systolic pressure. The tricuspid regurgitant  velocity is 2.79 m/s, and with an assumed right atrial pressure of 8 mmHg, the estimated right ventricular systolic pressure is 98.1 mmHg. Left Atrium: Left atrial size was moderately dilated. Right Atrium: Right atrial size was moderately dilated. Pericardium: There is no evidence of pericardial effusion. Mitral Valve: The mitral valve is degenerative in appearance. There is mild thickening of the mitral valve leaflet(s). Mild mitral annular calcification. Mild mitral valve regurgitation. No evidence of mitral valve stenosis. Tricuspid Valve: The tricuspid valve is grossly normal. Tricuspid valve regurgitation is mild . No evidence of tricuspid stenosis. Aortic Valve: The aortic valve is tricuspid. Aortic valve regurgitation is not visualized. No aortic stenosis is present. Pulmonic Valve: The pulmonic valve was grossly normal. Pulmonic valve regurgitation is not visualized. No evidence of pulmonic stenosis. Aorta: The aortic root and ascending aorta are structurally normal, with no evidence of dilitation. Venous: The inferior vena cava is normal in size with less than 50% respiratory variability, suggesting right atrial pressure of 8 mmHg. IAS/Shunts: Evidence of atrial level shunting detected by color flow Doppler. A small patent foramen ovale is detected with predominantly left to right shunting across the atrial septum.  LEFT VENTRICLE PLAX 2D LVIDd:         4.90 cm   Diastology LVIDs:         3.90 cm   LV e' medial:    2.93 cm/s LV PW:         0.90 cm   LV E/e' medial:  34.8 LV IVS:        0.90 cm   LV e' lateral:   3.15 cm/s LVOT diam:     1.80 cm   LV E/e' lateral: 32.4 LV SV:  45 LV SV Index:   29 LVOT Area:     2.54 cm                           3D Volume EF:                          3D EF:        49 %                          LV EDV:       124 ml                          LV ESV:       63 ml                          LV  SV:        61 ml RIGHT VENTRICLE RV Basal diam:  4.40 cm RV Mid diam:    3.60 cm RV S prime:     8.89 cm/s TAPSE (M-mode): 1.5 cm LEFT ATRIUM             Index        RIGHT ATRIUM           Index LA diam:        4.40 cm 2.83 cm/m   RA Area:     12.20 cm LA Vol (A2C):   66.9 ml 43.08 ml/m  RA Volume:   27.70 ml  17.84 ml/m LA Vol (A4C):   60.6 ml 39.03 ml/m LA Biplane Vol: 65.1 ml 41.93 ml/m  AORTIC VALVE LVOT Vmax:   81.60 cm/s LVOT Vmean:  56.800 cm/s LVOT VTI:    0.177 m  AORTA Ao Root diam: 2.40 cm Ao Asc diam:  2.50 cm MITRAL VALVE                TRICUSPID VALVE MV Area (PHT): 4.49 cm     TR Peak grad:   31.1 mmHg MV Decel Time: 169 msec     TR Vmax:        279.00 cm/s MV E velocity: 102.00 cm/s MV A velocity: 27.40 cm/s   SHUNTS MV E/A ratio:  3.72         Systemic VTI:  0.18 m                             Systemic Diam: 1.80 cm Candee Furbish MD Electronically signed by Candee Furbish MD Signature Date/Time: 02/27/2022/2:43:01 PM    Final    CT ABDOMEN PELVIS WO CONTRAST  Result Date: 02/27/2022 CLINICAL DATA:  Abdominal pain, acute, nonlocalized. Poor oral intake and decreased activity. EXAM: CT ABDOMEN AND PELVIS WITHOUT CONTRAST TECHNIQUE: Multidetector CT imaging of the abdomen and pelvis was performed following the standard protocol without IV contrast. RADIATION DOSE REDUCTION: This exam was performed according to the departmental dose-optimization program which includes automated exposure control, adjustment of the mA and/or kV according to patient size and/or use of iterative reconstruction technique. COMPARISON:  04/28/2012. FINDINGS: Lower chest: The heart is enlarged. Mild atelectasis is noted at the lung bases. Hepatobiliary: No focal liver abnormality is seen. Hyperdense material is present within the gallbladder, possible sludge or stones. No biliary  ductal dilatation. Pancreas: There is atrophy of the pancreatic tail with coarse calcifications. A cystic structure is noted in the region of the  pancreatic tail measuring 1 cm. No surrounding inflammatory changes. Spleen: Normal in size without focal abnormality. Adrenals/Urinary Tract: The adrenal glands are within normal limits. No renal calculus or hydronephrosis. Mild perinephric fat stranding is noted bilaterally. The bladder is unremarkable. Stomach/Bowel: Stomach is within normal limits. Appendix is not seen. No evidence of bowel wall thickening, distention, or inflammatory changes. No free air or pneumatosis. Vascular/Lymphatic: Aortic atherosclerosis. No enlarged abdominal or pelvic lymph nodes. Reproductive: Status post hysterectomy. No adnexal masses. Other: Mesenteric edema with a small amount of free fluid in the pelvis. Musculoskeletal: Mild anasarca. Degenerative changes are present in the thoracolumbar spine. No acute or suspicious osseous abnormality. IMPRESSION: 1. Hyperdense material in the gallbladder, possible sludge or stones. 2. Coarse calcifications in the tail of the pancreas with distal cystic attenuation, question cyst versus pancreatic ductal dilatation. The pancreas is not well evaluated due to lack of IV contrast. Further evaluation with contrast enhanced CT or MRI is suggested. 3. Mesenteric edema, small amount of free fluid in the pelvis, and anasarca. 4. Cardiomegaly. 5. Aortic atherosclerosis. Electronically Signed   By: Brett Fairy M.D.   On: 02/27/2022 00:55   DG Chest Port 1 View  Result Date: 02/26/2022 CLINICAL DATA:  Hypoxia EXAM: PORTABLE CHEST - 1 VIEW COMPARISON:  09/21/2020 FINDINGS: The heart size and mediastinal contours are within normal limits. There is diffuse pulmonary interstitial prominence which could be seen with atypical infection, edema or interstitial lung disease. Alveolar density left mid lung could represent developing consolidative pneumonia. Aorta is calcified. The visualized skeletal structures are unremarkable. IMPRESSION: Nonspecific interstitial prominence consistent with atypical  infection, edema or interstitial lung disease. Left mid lung consolidation may represent early bronchopneumonia. Electronically Signed   By: Sammie Bench M.D.   On: 02/26/2022 19:06        Scheduled Meds:  azithromycin  500 mg Oral QHS   carvedilol  6.25 mg Oral BID WC   Chlorhexidine Gluconate Cloth  6 each Topical Daily   enoxaparin (LOVENOX) injection  30 mg Subcutaneous Q24H   insulin aspart  0-15 Units Subcutaneous TID WC   insulin aspart  0-5 Units Subcutaneous QHS   insulin NPH Human  10 Units Subcutaneous BID AC & HS   mupirocin ointment   Nasal BID   predniSONE  40 mg Oral Q breakfast   Continuous Infusions:  cefTRIAXone (ROCEPHIN)  IV Stopped (02/27/22 2205)     LOS: 1 day    Time spent: 35 minutes    Barb Merino, MD [facility] Pager [phone]

## 2022-02-28 NOTE — Evaluation (Signed)
Occupational Therapy Evaluation Patient Details Name: [name] MRN: [id number] DOB: 1945-11-27 Today's Date: 02/28/2022   History of Present Illness patient is a 77 year old female admitted with RSV. Hx of neuropathy, respiratory failure, CHF, COPD, cardiomyopathy, DM, CKD   Clinical Impression   Patient is a 77 year old female who was admitted for above. Patient reported living at home independently with PRN support from children. Patient was noted to have LOB with need for mod A to maintain standing balance during functional mobility in room. Patient was able to maintain o2 saturation on RA 93% or above with nurse made aware. Patient was noted to have decreased functional activity tolerance, decreased endurance, decreased standing balance, decreased safety awareness, and decreased knowledge of AD/AE impacting participation in ADLs. Patient would continue to benefit from skilled OT services at this time while admitted and after d/c to address noted deficits in order to improve overall safety and independence in ADLs.        Recommendations for follow up therapy are one component of a multi-disciplinary discharge planning process, led by the attending physician.  Recommendations may be updated based on patient status, additional functional criteria and insurance authorization.   Follow Up Recommendations  Home health OT     Assistance Recommended at Discharge Frequent or constant Supervision/Assistance  Patient can return home with the following A little help with walking and/or transfers;Assistance with cooking/housework;Direct supervision/assist for medications management;Assist for transportation;Help with stairs or ramp for entrance;Direct supervision/assist for financial management;A little help with bathing/dressing/bathroom    Functional Status Assessment  Patient has had a recent decline in their functional status and demonstrates the ability to make significant improvements in  function in a reasonable and predictable amount of time.  Equipment Recommendations  None recommended by OT       Precautions / Restrictions Precautions Precautions: Fall Precaution Comments: monitor HR Restrictions Weight Bearing Restrictions: No      Mobility Bed Mobility     General bed mobility comments: patient was up in recliner and returned to the same.          Balance Overall balance assessment: Needs assistance Sitting-balance support: No upper extremity supported, Feet supported Sitting balance-Leahy Scale: Good     Standing balance support: During functional activity Standing balance-Leahy Scale: Poor         ADL either performed or assessed with clinical judgement   ADL Overall ADL's : Needs assistance/impaired Eating/Feeding: Modified independent;Sitting   Grooming: Modified independent;Wash/dry face;Wash/dry hands;Applying deodorant;Sitting Grooming Details (indicate cue type and reason): in recliner Upper Body Bathing: Set up;Sitting   Lower Body Bathing: Minimal assistance;Sitting/lateral leans;Sit to/from stand   Upper Body Dressing : Sitting;Set up   Lower Body Dressing: Minimal assistance;Sitting/lateral leans;Sit to/from stand   Toilet Transfer: Minimal assistance;Ambulation Toilet Transfer Details (indicate cue type and reason): patient participated in functional mobiltiy in room with noted moments of unseadiness with one large LOB with mod A to maintain standing. nurse made aware. patient reported " yeah that happens at home sometimes too". was able to maintain O2 on RA during session. Toileting- Clothing Manipulation and Hygiene: Minimal assistance;Sit to/from stand       Functional mobility during ADLs: Minimal assistance General ADL Comments: with no AD in room with one large loss of balance with patient unable to recover without physical assist mod A     Vision Patient Visual Report: No change from baseline  Pertinent Vitals/Pain Pain Assessment Pain Assessment: No/denies pain     Hand Dominance Right   Extremity/Trunk Assessment Upper Extremity Assessment Upper Extremity Assessment: Overall WFL for tasks assessed   Lower Extremity Assessment Lower Extremity Assessment: Defer to PT evaluation   Cervical / Trunk Assessment Cervical / Trunk Assessment: Normal   Communication Communication Communication: No difficulties   Cognition Arousal/Alertness: Awake/alert Behavior During Therapy: WFL for tasks assessed/performed Overall Cognitive Status: Within Functional Limits for tasks assessed                    Home Living Family/patient expects to be discharged to:: Private residence Living Arrangements: Alone Available Help at Discharge: Family;Available PRN/intermittently Type of Home: House Home Access: Stairs to enter Entrance Stairs-Number of Steps: 1-2   Home Layout: Two level;Able to live on main level with bedroom/bathroom Alternate Level Stairs-Number of Steps: flight of steps Alternate Level Stairs-Rails: Left Bathroom Shower/Tub: Walk-in shower         Home Equipment: Conservation officer, nature (2 wheels);Cane - single point;Grab bars - tub/shower   Additional Comments: in bathrooms upstairs and down      Prior Functioning/Environment Prior Level of Function : Independent/Modified Independent         ADLs Comments: family helps with transportation        OT Problem List: Impaired balance (sitting and/or standing);Decreased coordination;Decreased knowledge of use of DME or AE;Decreased knowledge of precautions;Cardiopulmonary status limiting activity      OT Treatment/Interventions: Self-care/ADL training;Energy conservation;Therapeutic exercise;DME and/or AE instruction;Therapeutic activities;Patient/family education;Balance training    OT Goals(Current goals can be found in the care plan section) Acute Rehab OT Goals Patient Stated Goal: to get back  home OT Goal Formulation: With patient Time For Goal Achievement: 03/14/22 Potential to Achieve Goals: Fair  OT Frequency: Min 2X/week       AM-PAC OT "6 Clicks" Daily Activity     Outcome Measure Help from another person eating meals?: None Help from another person taking care of personal grooming?: A Little Help from another person toileting, which includes using toliet, bedpan, or urinal?: A Little Help from another person bathing (including washing, rinsing, drying)?: A Little Help from another person to put on and taking off regular upper body clothing?: A Little Help from another person to put on and taking off regular lower body clothing?: A Little 6 Click Score: 19   End of Session Nurse Communication: Mobility status;Other (comment) (O2 during session)  Activity Tolerance: Patient tolerated treatment well Patient left: in chair;with call bell/phone within reach  OT Visit Diagnosis: Unsteadiness on feet (R26.81);Other abnormalities of gait and mobility (R26.89);Muscle weakness (generalized) (M62.81)                Time: 1017-5102 OT Time Calculation (min): 23 min Charges:  OT General Charges $OT Visit: 1 Visit OT Evaluation $OT Eval Moderate Complexity: 1 Mod OT Treatments $Self Care/Home Management : 8-22 mins  Rennie Plowman, MS Acute Rehabilitation Department Office# [phone]   Willa Rough 02/28/2022, 12:53 PM

## 2022-02-28 NOTE — Consult Note (Signed)
Cardiology Consultation   Patient ID: [name] MRN: [id number]; DOB: 09-21-1945  Admit date: 02/26/2022 Date of Consult: 02/28/2022  PCP:  Lujean Amel, [facility] Cardiologist:  Glenetta Hew, MD   Patient Profile:   [name] is a 77 y.o. female with a history of non-obstructive CAD noted on cardiac catheterization in 11/2015, non-ischemic cardiomyopathy/ chronic combined CHF with EF as low as 20% in the past but improved to 50-55% on last Echo in 09/2020, palpitations with brief runs of SVT and frequent PVCs/PAC on monitor in 05/2016, temote DVT, hypertension, hyperlipidemia, type 2 diabetes mellitus on insulin, CKD stage IIIb, remote breast cancer s/p lumpectomy and radiation as well as chemotherapy (Tamoxifen), and tobacco abuse  who is being seen 02/28/2022 for the evaluation of CHF at the request of Dr. Sloan Leiter.  History of Present Illness:   [name] is a 77 year old female with the above history who is followed by Dr. Ellyn Hack. She has a long history of non-ischemic cardiomyopathy. Remote cardiac MRI in 06/2008 showed moderate LV systolic dysfunction with EF of 38% (inferior and and inferolateral wall appeared segmentallly worse) as well as a very small area of mid-wall delayed enhancement in the inferolateral base which is a non-specific finding but can be seen with infiltrative diseases such as sarcoid as well as with myocarditis. He has had multiple hospitalizations for acute on chronic CHF over the years. During one of these admissions in 11/2015, Echo showed LVEF of 40-45%. He underwent LHC at that time which showed mild to moderate non-obstructive CAD with 15% stenosis of distal LAD, 30% ulcerated mid LCX disease, and 40% stenosis of mid RCA. He was admitted with acute on chronic CHF in 04/2016 and Echo showed a drop in EF to 20% with diffuse hypokinesis and grade 2 diastolic dysfunction as well as mildly reduced RV systolic function. RHC was performed and  showed compensated filling pressures. There was concern that frequent PVCs may be the cause for low EF or possible stress-induced cardiomyopathy. Monitor in 05/2016 showed normal sinus rhythm with occasional brief runs of SVT and frequent PVCs and PACs but PVC burden was down to 5.8%. EF had normalized on repeat Echo in 10/2016. She was last admitted with acute on chronic CHF in 09/2020 due to dietary indiscretion and medical non-adherence. Echo at that time showed LVEF of 50-55% with normal wall motion, mild LVH, and grade 2 diastolic dysfunction as well as moderately elevated RVSP of 46.7 mmHg. RV was normal. Patient was last seen by Jory Sims, NP, on 02/03/2022 at which time she reported lower extremity edema but had not been taking evening dose of Lasix because of increased urination at night. She was advised to take her evening dose of Lasix.  Patient presented to the ED on 02/26/2022 from an Urgent Care for further evaluation of shortness of breath after being found to be hypoxic with O2 sat of 86% at the Urgent Care. EKG showed normal sinus rhythm, rate 64 bpm, with a lot of baseline wandering but no acute ST/T changes. BNP >4,500. Chest x-ray showed non-specific interstitial prominence consistent with atypical infection, edema, or interstitial lung disease as well as left mid lung consolidation which may represent early bronchopneumonia. WBC 7.4, Hgb 9.3, Plts 150. Na 135, K 4.2, Glucose 349, BUN 51, Cr 2.22. AST and ALT both elevated at 164 but otherwise LFTs normal. Respiratory panel positive for RSV. Lactic acid normal. Blood cultures negative to date. She  was admitted for further management of RSV and started on steroids, antibiotics, inhalers, and and gentle IV fluids. Echo was ordered and showed a drop in EF to 35-40% with global hypokinesis and grade III diastolic dysfunction, moderately enlarged RV with low normal systolic function, moderate bilateral enlargement, mild MR, mildly elevated PASP of  39.1 mmHg, and a small PFO with predominantly left to right shunting. Cardiology was consulted for further evaluation.  At bedside, she is congested. Still has some SOB. She did get an opportunity to walk around and did ok. She feels closer to baseline but not quite.    Past Medical History:  Diagnosis Date   Breast cancer ([place])    breast - left    Chronic diastolic CHF (congestive heart failure), NYHA class 2 ([place])    a. cMRI 5/10: EF 38% // b. Echo 3/13/: EF 50-55, Gr 1 DD // c. Echo 8/14: EF 40, inf/inf-septal HK, Gr 1 DD, mild MR // d. Echo 5/17: EF 40-45, inf HK, Gr 1 DD, mild MR, mild LAE, reduced RVSF, mild TR, PASP 33 // e. Echo 10/17: EF 40-45, Gr 2 DD, mild MR, severe LAE; => 09/2020 EF 50-55%, GrII DD.   Coronary artery disease, non-occlusive    a. nonobs by LHC in 2010 // b. Myoview 10/17: Lg infarct apex, distal ant and lat walls, no ischemia, EF 38; int risk  // c. [place] 10/17:  dLAD 15, pLCx 110, mRCA 40, LVEDP 15   Diabetes mellitus    NICM (nonischemic cardiomyopathy) ([place])     Past Surgical History:  Procedure Laterality Date   APPENDECTOMY     BREAST LUMPECTOMY     lt breast   CARDIAC CATHETERIZATION N/A 12/20/2015   Procedure: Left Heart Cath and Coronary Angiography;  Surgeon: Nelva Bush, MD:: Mild to moderate, nonobstructive coronary artery disease, including 15% distal LAD stenosis, 30% ulcerated mid LCx disease, and 40% mid RCA stenosis.  Upper normal left ventricular filling pressure (LVEDP 15 mmHg).   RIGHT HEART CATH N/A 05/08/2016   Procedure: Right Heart Cath;  Surgeon: Jolaine Artist, MD;  Location: [facility];; RA = 3, RV = 34/5, PA = 38/12 (21), PCW = 12; Fick cardiac output/index = 4.6/2.6; PVR = 1.9 WU, Ao sat = 96%; PA sat = 58%, 62%    TRANSTHORACIC ECHOCARDIOGRAM  11/2015   a) 11/2015: EF 40 and 45%.  GRII DD; b) 04/2016: EF 20% with grade 2 diastolic dysfunction   TRANSTHORACIC ECHOCARDIOGRAM  10/2016   a) EF 55-60%.  Normal LV function.   Mild DD.Marland Kitchen  Normal valve Fxn.; b) 09/2020: EF 50 to 55%.  Normal function.  No R WMA.  Mild LVH.  GR 2 DD with elevated LAP.-Mild LA dilation.  Moderately elevated PAP estimated RVSP 47 mmHg, and RAP 8 mmHg.   TRIGGER FINGER RELEASE Left 04/23/2017   Procedure: RELEASE TRIGGER FINGER TO LEFT FORTH FINGER;  Surgeon: Cristine Polio, MD;  Location: [facility];  Service: Plastics;  Laterality: Left;     Home Medications:  Prior to Admission medications   Medication Sig Start Date End Date Taking? Authorizing Provider  atorvastatin (LIPITOR) 40 MG tablet Take 1 tablet (40 mg total) by mouth daily. 08/15/21  Yes Sande Rives E, PA-C  carvedilol (COREG) 6.25 MG tablet Take 1 tablet (6.25 mg total) by mouth 2 (two) times daily. 08/15/21  Yes Sande Rives E, PA-C  furosemide (LASIX) 40 MG tablet Take 40 mg in the morning, and  20 mg in the evening. 02/03/22  Yes Lendon Colonel, NP  insulin NPH-regular Human (70-30) 100 UNIT/ML injection 22 units with breakfast, and 12 units with supper. Patient taking differently: Inject 25 Units into the skin as needed (for high BS). 12/10/20  Yes Renato Shin, MD  sacubitril-valsartan (ENTRESTO) 49-51 MG Take 1 tablet by mouth 2 (two) times daily. 03/01/21  Yes Leonie Man, MD    Inpatient Medications: Scheduled Meds:  azithromycin  500 mg Oral QHS   carvedilol  6.25 mg Oral BID WC   Chlorhexidine Gluconate Cloth  6 each Topical Daily   enoxaparin (LOVENOX) injection  30 mg Subcutaneous Q24H   feeding supplement (GLUCERNA SHAKE)  237 mL Oral BID BM   insulin aspart  0-15 Units Subcutaneous TID WC   insulin aspart  0-5 Units Subcutaneous QHS   insulin NPH Human  10 Units Subcutaneous BID AC & HS   mupirocin ointment   Nasal BID   predniSONE  40 mg Oral Q breakfast   Continuous Infusions:  cefTRIAXone (ROCEPHIN)  IV Stopped (02/27/22 2205)   PRN Meds: acetaminophen **OR** acetaminophen, ipratropium-albuterol, ondansetron **OR**  ondansetron (ZOFRAN) IV, mouth rinse  Allergies:    Allergies  Allergen Reactions   Tape Rash    Pt can tolerate the paper tape    Social History:   Social History   Socioeconomic History   Marital status: Widowed    Spouse name: Not on file   Number of children: Not on file   Years of education: Not on file   Highest education level: Not on file  Occupational History   Not on file  Tobacco Use   Smoking status: Former    Years: 30.00    Types: Cigarettes    Quit date: 09/29/2012    Years since quitting: 9.4   Smokeless tobacco: Never  Substance and Sexual Activity   Alcohol use: No   Drug use: No   Sexual activity: Not on file  Other Topics Concern   Not on file  Social History Narrative   Not on file   Social Determinants of Health   Financial Resource Strain: Not on file  Food Insecurity: No Food Insecurity (02/27/2022)   Hunger Vital Sign    Worried About Running Out of Food in the Last Year: Never true    Ran Out of Food in the Last Year: Never true  Transportation Needs: No Transportation Needs (02/27/2022)   PRAPARE - Hydrologist (Medical): No    Lack of Transportation (Non-Medical): No  Physical Activity: Not on file  Stress: Not on file  Social Connections: Not on file  Intimate Partner Violence: Not At Risk (02/27/2022)   Humiliation, Afraid, Rape, and Kick questionnaire    Fear of Current or Ex-Partner: No    Emotionally Abused: No    Physically Abused: No    Sexually Abused: No    Family History:   Family History  Problem Relation Age of Onset   Stroke Father    Diabetes Maternal Grandmother      ROS:  Please see the history of present illness.   All other ROS reviewed and negative.     Physical Exam/Data:   Vitals:   02/28/22 0350 02/28/22 0400 02/28/22 0800 02/28/22 1200  BP:  (!) 145/68 134/69 127/65  Pulse: 61 (!) 58 (!) 56 (!) 55  Resp: '16 10 17 15  '$ Temp: 98.6 F (37 C)  98.3 F (36.8 C) 97.9  F (36.6  C)  TempSrc:   Oral Oral  SpO2: 99% 100% 100% 94%  Weight:      Height:        Intake/Output Summary (Last 24 hours) at 02/28/2022 1256 Last data filed at 02/28/2022 0900 Gross per 24 hour  Intake 550.24 ml  Output 450 ml  Net 100.24 ml      02/27/2022    4:00 AM 02/26/2022    5:42 PM 02/03/2022    3:39 PM  Last 3 Weights  Weight (lbs) 117 lb 11.6 oz 115 lb 119 lb  Weight (kg) 53.4 kg 52.164 kg 53.978 kg     Body mass index is 20.52 kg/m.  General:  Well nourished, well developed, in no acute distress. Sitting up HEENT: normal Neck: no JVD Vascular: No carotid bruits; Distal pulses 2+ bilaterally Cardiac:  normal S1, S2; RRR; no murmur  Lungs:  nl wob, decreased BS in the bases Abd: soft, nontender, no hepatomegaly  Ext: no edema Musculoskeletal:  No deformities, BUE and BLE strength normal and equal Skin: warm and dry  Neuro:  CNs 2-12 intact, no focal abnormalities noted Psych:  Normal affect   EKG:  The EKG was personally reviewed and demonstrates:  Normal sinus rhythm, rate 64 bpm, with a lot of baseline wandering but no acute ST/T changes.  Telemetry:  Telemetry was personally reviewed and demonstrates:  sinus/sinus bradycardia PVC. Noted R/T. QTC is normal/decreasing BB  Relevant CV Studies:  Echocardiogram 02/28/2022: Impressions: 1. Left ventricular ejection fraction, by estimation, is 35 to 40%. The  left ventricle has moderately decreased function. The left ventricle  demonstrates global hypokinesis. The left ventricular internal cavity size  was moderately dilated. Left  ventricular diastolic parameters are consistent with Grade III diastolic  dysfunction (restrictive).   2. Right ventricular systolic function is low normal. The right  ventricular size is moderately enlarged. There is mildly elevated  pulmonary artery systolic pressure. The estimated right ventricular  systolic pressure is 35.5 mmHg.   3. Left atrial size was moderately dilated.   4. Right  atrial size was moderately dilated.   5. The mitral valve is degenerative. Mild mitral valve regurgitation. No  evidence of mitral stenosis.   6. The aortic valve is tricuspid. Aortic valve regurgitation is not  visualized. No aortic stenosis is present.   7. The inferior vena cava is normal in size with <50% respiratory  variability, suggesting right atrial pressure of 8 mmHg.   8. Evidence of atrial level shunting detected by color flow Doppler.  There is a small patent foramen ovale with predominantly left to right  shunting across the atrial septum.   Comparison(s): The left ventricular function is worsened. Prior EF 50-55%.    Laboratory Data:  High Sensitivity Troponin:  No results for input(s): "TROPONINIHS" in the last 720 hours.   Chemistry Recent Labs  Lab 02/26/22 1759 02/27/22 0401 02/28/22 0810  NA 135 133* 135  K 4.2 4.5 4.0  CL 94* 96* 97*  CO2 '29 28 29  '$ GLUCOSE 349* 184* 161*  BUN 51* 55* 71*  CREATININE 2.22* 2.25* 2.07*  CALCIUM 9.3 8.6* 8.8*  GFRNONAA 22* 22* 24*  ANIONGAP '12 9 9    '$ Recent Labs  Lab 02/26/22 1759 02/27/22 0401  PROT 6.5 5.7*  ALBUMIN 3.7 2.7*  AST 164* 147*  ALT 164* 155*  ALKPHOS 100 85  BILITOT 1.2 0.8   Lipids No results for input(s): "CHOL", "TRIG", "HDL", "LABVLDL", "LDLCALC", "CHOLHDL" in the  last 168 hours.  Hematology Recent Labs  Lab 02/26/22 1759 02/27/22 0401 02/28/22 0810  WBC 7.4 8.3 8.3  RBC 2.97* 2.91* 3.04*  HGB 9.3* 9.3* 9.3*  HCT 27.5* 27.6* 28.3*  MCV 92.6 94.8 93.1  MCH 31.3 32.0 30.6  MCHC 33.8 33.7 32.9  RDW 14.7 14.8 14.7  PLT 150 157 197   Thyroid No results for input(s): "TSH", "FREET4" in the last 168 hours.  BNP Recent Labs  Lab 02/26/22 1759  BNP >4,500.0*    DDimer No results for input(s): "DDIMER" in the last 168 hours.   Radiology/Studies:  ECHOCARDIOGRAM COMPLETE  Result Date: 02/27/2022    ECHOCARDIOGRAM REPORT   Patient Name:   CARRIEANN SPIELBERG Date of Exam: 02/27/2022 Medical Rec  #:  [id number]    Height:       63.5 in Accession #:    [id number]   Weight:       117.7 lb Date of Birth:  1945/09/03    BSA:          1.553 m Patient Age:    51 years     BP:           150/84 mmHg Patient Gender: F            HR:           61 bpm. Exam Location:  Inpatient Procedure: 3D Echo, 2D Echo, Color Doppler and Cardiac Doppler Indications:    CHF-Acute Diastolic A07.62  History:        Patient has prior history of Echocardiogram examinations, most                 recent 09/20/2020. CHF and Cardiomyopathy, CAD; Risk                 Factors:Diabetes. Past history of breast cancer.  Sonographer:    Darlina Sicilian RDCS Referring Phys: [place]  1. Left ventricular ejection fraction, by estimation, is 35 to 40%. The left ventricle has moderately decreased function. The left ventricle demonstrates global hypokinesis. The left ventricular internal cavity size was moderately dilated. Left ventricular diastolic parameters are consistent with Grade III diastolic dysfunction (restrictive).  2. Right ventricular systolic function is low normal. The right ventricular size is moderately enlarged. There is mildly elevated pulmonary artery systolic pressure. The estimated right ventricular systolic pressure is 26.3 mmHg.  3. Left atrial size was moderately dilated.  4. Right atrial size was moderately dilated.  5. The mitral valve is degenerative. Mild mitral valve regurgitation. No evidence of mitral stenosis.  6. The aortic valve is tricuspid. Aortic valve regurgitation is not visualized. No aortic stenosis is present.  7. The inferior vena cava is normal in size with <50% respiratory variability, suggesting right atrial pressure of 8 mmHg.  8. Evidence of atrial level shunting detected by color flow Doppler. There is a small patent foramen ovale with predominantly left to right shunting across the atrial septum. Comparison(s): The left ventricular function is worsened. Prior EF 50-55%. FINDINGS   Left Ventricle: Left ventricular ejection fraction, by estimation, is 35 to 40%. The left ventricle has moderately decreased function. The left ventricle demonstrates global hypokinesis. 3D left ventricular ejection fraction analysis performed but not reported based on interpreter judgement due to suboptimal tracking. The left ventricular internal cavity size was moderately dilated. There is no left ventricular hypertrophy. Left ventricular diastolic parameters are consistent with Grade III diastolic dysfunction (restrictive). Right Ventricle: The right ventricular size is  moderately enlarged. No increase in right ventricular wall thickness. Right ventricular systolic function is low normal. There is mildly elevated pulmonary artery systolic pressure. The tricuspid regurgitant  velocity is 2.79 m/s, and with an assumed right atrial pressure of 8 mmHg, the estimated right ventricular systolic pressure is 31.5 mmHg. Left Atrium: Left atrial size was moderately dilated. Right Atrium: Right atrial size was moderately dilated. Pericardium: There is no evidence of pericardial effusion. Mitral Valve: The mitral valve is degenerative in appearance. There is mild thickening of the mitral valve leaflet(s). Mild mitral annular calcification. Mild mitral valve regurgitation. No evidence of mitral valve stenosis. Tricuspid Valve: The tricuspid valve is grossly normal. Tricuspid valve regurgitation is mild . No evidence of tricuspid stenosis. Aortic Valve: The aortic valve is tricuspid. Aortic valve regurgitation is not visualized. No aortic stenosis is present. Pulmonic Valve: The pulmonic valve was grossly normal. Pulmonic valve regurgitation is not visualized. No evidence of pulmonic stenosis. Aorta: The aortic root and ascending aorta are structurally normal, with no evidence of dilitation. Venous: The inferior vena cava is normal in size with less than 50% respiratory variability, suggesting right atrial pressure of 8 mmHg.  IAS/Shunts: Evidence of atrial level shunting detected by color flow Doppler. A small patent foramen ovale is detected with predominantly left to right shunting across the atrial septum.  LEFT VENTRICLE PLAX 2D LVIDd:         4.90 cm   Diastology LVIDs:         3.90 cm   LV e' medial:    2.93 cm/s LV PW:         0.90 cm   LV E/e' medial:  34.8 LV IVS:        0.90 cm   LV e' lateral:   3.15 cm/s LVOT diam:     1.80 cm   LV E/e' lateral: 32.4 LV SV:         45 LV SV Index:   29 LVOT Area:     2.54 cm                           3D Volume EF:                          3D EF:        49 %                          LV EDV:       124 ml                          LV ESV:       63 ml                          LV SV:        61 ml RIGHT VENTRICLE RV Basal diam:  4.40 cm RV Mid diam:    3.60 cm RV S prime:     8.89 cm/s TAPSE (M-mode): 1.5 cm LEFT ATRIUM             Index        RIGHT ATRIUM           Index LA diam:        4.40 cm 2.83 cm/m   RA Area:  12.20 cm LA Vol (A2C):   66.9 ml 43.08 ml/m  RA Volume:   27.70 ml  17.84 ml/m LA Vol (A4C):   60.6 ml 39.03 ml/m LA Biplane Vol: 65.1 ml 41.93 ml/m  AORTIC VALVE LVOT Vmax:   81.60 cm/s LVOT Vmean:  56.800 cm/s LVOT VTI:    0.177 m  AORTA Ao Root diam: 2.40 cm Ao Asc diam:  2.50 cm MITRAL VALVE                TRICUSPID VALVE MV Area (PHT): 4.49 cm     TR Peak grad:   31.1 mmHg MV Decel Time: 169 msec     TR Vmax:        279.00 cm/s MV E velocity: 102.00 cm/s MV A velocity: 27.40 cm/s   SHUNTS MV E/A ratio:  3.72         Systemic VTI:  0.18 m                             Systemic Diam: 1.80 cm Candee Furbish MD Electronically signed by Candee Furbish MD Signature Date/Time: 02/27/2022/2:43:01 PM    Final    CT ABDOMEN PELVIS WO CONTRAST  Result Date: 02/27/2022 CLINICAL DATA:  Abdominal pain, acute, nonlocalized. Poor oral intake and decreased activity. EXAM: CT ABDOMEN AND PELVIS WITHOUT CONTRAST TECHNIQUE: Multidetector CT imaging of the abdomen and pelvis was performed  following the standard protocol without IV contrast. RADIATION DOSE REDUCTION: This exam was performed according to the departmental dose-optimization program which includes automated exposure control, adjustment of the mA and/or kV according to patient size and/or use of iterative reconstruction technique. COMPARISON:  04/28/2012. FINDINGS: Lower chest: The heart is enlarged. Mild atelectasis is noted at the lung bases. Hepatobiliary: No focal liver abnormality is seen. Hyperdense material is present within the gallbladder, possible sludge or stones. No biliary ductal dilatation. Pancreas: There is atrophy of the pancreatic tail with coarse calcifications. A cystic structure is noted in the region of the pancreatic tail measuring 1 cm. No surrounding inflammatory changes. Spleen: Normal in size without focal abnormality. Adrenals/Urinary Tract: The adrenal glands are within normal limits. No renal calculus or hydronephrosis. Mild perinephric fat stranding is noted bilaterally. The bladder is unremarkable. Stomach/Bowel: Stomach is within normal limits. Appendix is not seen. No evidence of bowel wall thickening, distention, or inflammatory changes. No free air or pneumatosis. Vascular/Lymphatic: Aortic atherosclerosis. No enlarged abdominal or pelvic lymph nodes. Reproductive: Status post hysterectomy. No adnexal masses. Other: Mesenteric edema with a small amount of free fluid in the pelvis. Musculoskeletal: Mild anasarca. Degenerative changes are present in the thoracolumbar spine. No acute or suspicious osseous abnormality. IMPRESSION: 1. Hyperdense material in the gallbladder, possible sludge or stones. 2. Coarse calcifications in the tail of the pancreas with distal cystic attenuation, question cyst versus pancreatic ductal dilatation. The pancreas is not well evaluated due to lack of IV contrast. Further evaluation with contrast enhanced CT or MRI is suggested. 3. Mesenteric edema, small amount of free fluid  in the pelvis, and anasarca. 4. Cardiomegaly. 5. Aortic atherosclerosis. Electronically Signed   By: Brett Fairy M.D.   On: 02/27/2022 00:55   DG Chest Port 1 View  Result Date: 02/26/2022 CLINICAL DATA:  Hypoxia EXAM: PORTABLE CHEST - 1 VIEW COMPARISON:  09/21/2020 FINDINGS: The heart size and mediastinal contours are within normal limits. There is diffuse pulmonary interstitial prominence which could be seen with atypical infection, edema or  interstitial lung disease. Alveolar density left mid lung could represent developing consolidative pneumonia. Aorta is calcified. The visualized skeletal structures are unremarkable. IMPRESSION: Nonspecific interstitial prominence consistent with atypical infection, edema or interstitial lung disease. Left mid lung consolidation may represent early bronchopneumonia. Electronically Signed   By: Sammie Bench M.D.   On: 02/26/2022 19:06     Assessment and Plan:   Systolic CHF: hx NICM. had recovery of her EF on GDMT. Now declined in the setting of RSV. No chest pain or signs of ACS. BNP is elevated, could benefit form light IV diuresis -IV 20 mg x1 lasix; will re-assess tomorrow  -continue home entresto 49-51 as long as GFR stable ( will reassess)  -decreased core 3.125 mg BID with rates low 50s  -no spironolactone/SGLT2 at this time with AKI, GFR 24. Can consider jardiance 10 mg closer to dispo  -K>4, Mg>2  2. RSV- supportive care  3. DM2- insulin   Time Spent Directly with Patient:  I have spent a total of 45 minutes with the patient reviewing hospital notes, telemetry, EKGs, labs and examining the patient as well as establishing an assessment and plan that was discussed personally with the patient.  > 50% of time was spent in direct patient care.   Risk Assessment/Risk Scores:       For questions or updates, please contact [facility] Please consult [email or web address] for contact info under    Janina Mayo 02/28/2022

## 2022-02-28 NOTE — Progress Notes (Signed)
PHARMACIST - PHYSICIAN COMMUNICATION DR:   Sloan Leiter CONCERNING: Antibiotic IV to Oral Route Change Policy  RECOMMENDATION: This patient is receiving azithromycin by the intravenous route.  Based on criteria approved by the Pharmacy and Therapeutics Committee, the antibiotic(s) is/are being converted to the equivalent oral dose form(s).   DESCRIPTION: These criteria include: Patient being treated for a respiratory tract infection, urinary tract infection, cellulitis or clostridium difficile associated diarrhea if on metronidazole The patient is not neutropenic and does not exhibit a GI malabsorption state The patient is eating (either orally or via tube) and/or has been taking other orally administered medications for a least 24 hours The patient is improving clinically and has a Tmax < 100.5  If you have questions about this conversion, please contact the Pharmacy Department  '[]'$   [phone] )  [facility] '[]'$   [phone] )  [facility] '[]'$   [phone] )  [facility] '[]'$   [phone] )  [facility] '[x]'$   [phone] )  [place], PharmD, BCPS 02/28/2022 9:54 AM

## 2022-02-28 NOTE — Progress Notes (Signed)
Physical Therapy Treatment Patient Details Name: AIJAH LATTNER MRN: [id number] DOB: [birth year] Today's Date: 02/28/2022   History of Present Illness patient is a 77 year old female admitted with RSV. Hx of neuropathy, respiratory failure, CHF, COPD, cardiomyopathy, DM, CKD    PT Comments    Pt seen in ICU room # 1227 Pt already OOB in recliner via OT.  Assisted with amb went well.  General transfer comment: 25% VC's on proper hand placement to push up from recliner vs pull on walker.  Slightly unsteady. General Gait Details: used a walker just for safety as pt c/o "weak legs" as well as slight instability Recliner following for safety.  Avg RA was 96% and HR 88 with amb.  At rest HR was 77.  No true c/o dyspnea just mild. Pt plans to D/C back home.   Recommendations for follow up therapy are one component of a multi-disciplinary discharge planning process, led by the attending physician.  Recommendations may be updated based on patient status, additional functional criteria and insurance authorization.  Follow Up Recommendations  Home health PT     Assistance Recommended at Discharge PRN  Patient can return home with the following A little help with walking and/or transfers;A little help with bathing/dressing/bathroom;Assistance with cooking/housework;Assist for transportation;Help with stairs or ramp for entrance   Equipment Recommendations  Rollator (4 wheels)    Recommendations for Other Services       Precautions / Restrictions Precautions Precautions: Fall Precaution Comments: monitor vitals Restrictions Weight Bearing Restrictions: No     Mobility  Bed Mobility               General bed mobility comments: OOB in recliner    Transfers Overall transfer level: Needs assistance Equipment used: Rolling walker (2 wheels) Transfers: Sit to/from Stand Sit to Stand: Supervision, Min guard           General transfer comment: 25% VC's on proper hand placement to  push up from recliner vs pull on walker.  Slightly unsteady.    Ambulation/Gait Ambulation/Gait assistance: Min guard, Min assist Gait Distance (Feet): 185 Feet Assistive device: Rolling walker (2 wheels) Gait Pattern/deviations: Step-through pattern, Decreased stride length Gait velocity: decreased     General Gait Details: used a walker just for safety as pt c/o "weak legs" as well as slight instability Recliner following for safety.  Avg RA was 96% and HR 88 with amb.  At rest HR was 77.  No true c/o dyspnea just mild   Stairs             Wheelchair Mobility    Modified Rankin (Stroke Patients Only)       Balance                                            Cognition Arousal/Alertness: Awake/alert Behavior During Therapy: WFL for tasks assessed/performed Overall Cognitive Status: Within Functional Limits for tasks assessed                                 General Comments: AxO x 3 very pleasant and willing Lady.        Exercises      General Comments        Pertinent Vitals/Pain Pain Assessment Pain Assessment: No/denies pain    Home Living  Family/patient expects to be discharged to:: Private residence Living Arrangements: Alone Available Help at Discharge: Family;Available PRN/intermittently Type of Home: House Home Access: Stairs to enter   Entrance Stairs-Number of Steps: 1-2 Alternate Level Stairs-Number of Steps: flight of steps Home Layout: Two level;Able to live on main level with bedroom/bathroom Home Equipment: Rolling Walker (2 wheels);Cane - single point;Grab bars - tub/shower Additional Comments: in bathrooms upstairs and down    Prior Function            PT Goals (current goals can now be found in the care plan section) Progress towards PT goals: Progressing toward goals    Frequency    Min 3X/week      PT Plan Current plan remains appropriate    Co-evaluation              AM-PAC  PT "6 Clicks" Mobility   Outcome Measure  Help needed turning from your back to your side while in a flat bed without using bedrails?: A Little Help needed moving from lying on your back to sitting on the side of a flat bed without using bedrails?: A Little Help needed moving to and from a bed to a chair (including a wheelchair)?: A Little Help needed standing up from a chair using your arms (e.g., wheelchair or bedside chair)?: A Little Help needed to walk in hospital room?: A Little Help needed climbing 3-5 steps with a railing? : A Little 6 Click Score: 18    End of Session Equipment Utilized During Treatment: Gait belt Activity Tolerance: Patient tolerated treatment well;Patient limited by fatigue Patient left: in chair   PT Visit Diagnosis: Unsteadiness on feet (R26.81);Difficulty in walking, not elsewhere classified (R26.2)     Time: 4132-4401 PT Time Calculation (min) (ACUTE ONLY): 25 min  Charges:  $Gait Training: 8-22 mins $Therapeutic Activity: 8-22 mins                     Rica Koyanagi  PTA Acute  Rehabilitation Services Office M-F          [phone] Weekend pager [phone]

## 2022-02-28 NOTE — Progress Notes (Signed)
Initial Nutrition Assessment  DOCUMENTATION CODES:   Non-severe (moderate) malnutrition in context of chronic illness  INTERVENTION:  - Carb Modified diet.  - Glucerna Shake po BID, each supplement provides 220 kcal and 10 grams of protein - Encourage intake at all meals.  - Monitor weight trends.   NUTRITION DIAGNOSIS:   Moderate Malnutrition related to chronic illness (CAD, CKD3, DMII) as evidenced by moderate muscle depletion, moderate fat depletion.  GOAL:   Patient will meet greater than or equal to 90% of their needs  MONITOR:   PO intake, Supplement acceptance, Weight trends  REASON FOR ASSESSMENT:   Malnutrition Screening Tool    ASSESSMENT:   77 year old female with CAD, nonischemic cardiomyopathy, HTN, DMII and CKD stage IIIbwho presented with about 4 days of cough congestion, drowsiness, poor oral intake found to have RSV.  Patient reports a recent UBW of 119# but states she used to weigh 165# before Covid (around 2020). Endorses significant weight loss over the past 2 years. Per EMR, patient's weight has been stable at 119# since June 2023.   Patient endorses eating 3 meals a day at home plus snacks. Food recall as below: Breakfast: scrambled eggs with cheese, occasionally bacon or cereal, coffee, milk Lunch: yogurt with fruit Dinner: son usually cooks a casserole or heats up frozen meal from [employer] Choice" Snacks: a lot of fruit Supplements: Glucerna or Ensure or Muscle Milk (whatever she has on hand), usually 1x/day  Patient admits she hasn't been eating as well during admission. However, endorses eating okay at breakfast this morning. She is documented to have had 75% of breakfast, which patient reported was cereal, a banana, and 2 coffees. Discussed importance of eating well during admission to avoid weight loss. Patient is agreeable to receive Glucerna twice daily to support intake.  Medications reviewed and include: Insulin  Labs reviewed:   Creatinine 2.07 HA1C 13.3 Blood glucose 162-250 x24 hours   NUTRITION - FOCUSED PHYSICAL EXAM:  Flowsheet Row Most Recent Value  Orbital Region Moderate depletion  Upper Arm Region Moderate depletion  Thoracic and Lumbar Region Moderate depletion  Buccal Region Moderate depletion  Temple Region Moderate depletion  Clavicle Bone Region Moderate depletion  Clavicle and Acromion Bone Region Severe depletion  Scapular Bone Region Unable to assess  Dorsal Hand Moderate depletion  Patellar Region Moderate depletion  Anterior Thigh Region Moderate depletion  Posterior Calf Region Moderate depletion  Edema (RD Assessment) Mild  Hair Reviewed  Eyes Reviewed  Mouth Reviewed  Skin Reviewed  Nails Reviewed       Diet Order:   Diet Order             Diet Carb Modified Fluid consistency: Thin; Room service appropriate? Yes  Diet effective now                   EDUCATION NEEDS:  Education needs have been addressed  Skin:  Skin Assessment: Reviewed RN Assessment  Last BM:  1/8  Height:  Ht Readings from Last 1 Encounters:  02/27/22 5' 3.5" (1.613 m)   Weight:  Wt Readings from Last 1 Encounters:  02/27/22 53.4 kg    BMI:  Body mass index is 20.52 kg/m.  Estimated Nutritional Needs:  Kcal:  1700-1850 kcals Protein:  75-85 grams Fluid:  >/= 1.7L    Samson Frederic RD, LDN For contact information, refer to [facility].

## 2022-03-01 DIAGNOSIS — I429 Cardiomyopathy, unspecified: Secondary | ICD-10-CM | POA: Diagnosis not present

## 2022-03-01 DIAGNOSIS — J121 Respiratory syncytial virus pneumonia: Secondary | ICD-10-CM | POA: Diagnosis not present

## 2022-03-01 DIAGNOSIS — E440 Moderate protein-calorie malnutrition: Secondary | ICD-10-CM | POA: Insufficient documentation

## 2022-03-01 DIAGNOSIS — I5020 Unspecified systolic (congestive) heart failure: Secondary | ICD-10-CM | POA: Diagnosis not present

## 2022-03-01 LAB — BASIC METABOLIC PANEL
Anion gap: 13 (ref 5–15)
BUN: 65 mg/dL — ABNORMAL HIGH (ref 8–23)
CO2: 27 mmol/L (ref 22–32)
Calcium: 8.8 mg/dL — ABNORMAL LOW (ref 8.9–10.3)
Chloride: 94 mmol/L — ABNORMAL LOW (ref 98–111)
Creatinine, Ser: 1.8 mg/dL — ABNORMAL HIGH (ref 0.44–1.00)
GFR, Estimated: 28 mL/min — ABNORMAL LOW (ref >60)
Glucose, Bld: 204 mg/dL — ABNORMAL HIGH (ref 70–99)
Potassium: 3.8 mmol/L (ref 3.5–5.1)
Sodium: 134 mmol/L — ABNORMAL LOW (ref 135–145)

## 2022-03-01 LAB — GLUCOSE, CAPILLARY
Glucose-Capillary: 194 mg/dL — ABNORMAL HIGH (ref 70–99)
Glucose-Capillary: 213 mg/dL — ABNORMAL HIGH (ref 70–99)
Glucose-Capillary: 74 mg/dL (ref 70–99)
Glucose-Capillary: 152 mg/dL — ABNORMAL HIGH (ref 70–99)

## 2022-03-01 MED ORDER — POTASSIUM CHLORIDE CRYS ER 20 MEQ PO TBCR
40.0000 meq | EXTENDED_RELEASE_TABLET | Freq: Once | ORAL | Status: AC
  Administered 2022-03-01: 40 meq via ORAL
  Filled 2022-03-01: qty 2

## 2022-03-01 MED ORDER — INSULIN ASPART 100 UNIT/ML IJ SOLN
4.0000 [IU] | Freq: Three times a day (TID) | INTRAMUSCULAR | Status: DC
  Administered 2022-03-03 (×2): 4 [IU] via SUBCUTANEOUS

## 2022-03-01 MED ORDER — FUROSEMIDE 20 MG PO TABS
20.0000 mg | ORAL_TABLET | Freq: Once | ORAL | Status: AC
  Administered 2022-03-01: 20 mg via ORAL
  Filled 2022-03-01: qty 1

## 2022-03-01 NOTE — TOC Initial Note (Signed)
Transition of Care [facility]) - Initial/Assessment Note    Patient Details  Name: [name] MRN: [id number] Date of Birth: 08/09/1945  Transition of [facility]) CM/SW Contact:    Illene Regulus, LCSW Phone Number: 03/01/2022, 10:11 AM  Clinical Narrative:                 CSW spoke with patient regarding recommendations for [facility] services. Patient declined [facility] services , pt stated" I get along fine" patient reported she uses no equipment at baseline and will not need any at d/c. No additional TOC needs , TOC will sign off.   Expected Discharge Plan: Home/Self Care Barriers to Discharge: Continued Medical Work up   Patient Goals and CMS Choice Patient states their goals for this hospitalization and ongoing recovery are:: Return home CMS Medicare.gov Compare Post Acute Care list provided to:: Patient Choice offered to / list presented to : Patient      Expected Discharge Plan and Services       Living arrangements for the past 2 months: Single Family Home                                      Prior Living Arrangements/Services Living arrangements for the past 2 months: Single Family Home Lives with:: Self Patient language and need for interpreter reviewed:: Yes Do you feel safe going back to the place where you live?: Yes      Need for Family Participation in Patient Care: No (Comment) Care giver support system in place?: No (comment)   Criminal Activity/Legal Involvement Pertinent to Current Situation/Hospitalization: No - Comment as needed  Activities of Daily Living Home Assistive Devices/Equipment: Eyeglasses, CBG Meter, Raised toilet seat with rails, Walker (specify type), Cane (specify quad or straight) ADL Screening (condition at time of admission) Patient's cognitive ability adequate to safely complete daily activities?: Yes Is the patient deaf or have difficulty hearing?: No Does the patient have difficulty seeing, even when wearing glasses/contacts?:  Yes Does the patient have difficulty concentrating, remembering, or making decisions?: Yes Patient able to express need for assistance with ADLs?: No Does the patient have difficulty dressing or bathing?: No Independently performs ADLs?: Yes (appropriate for developmental age) Does the patient have difficulty walking or climbing stairs?: Yes Weakness of Legs: Both Weakness of Arms/Hands: Both  Permission Sought/Granted                  Emotional Assessment Appearance:: Appears stated age Attitude/Demeanor/Rapport: Gracious Affect (typically observed): Accepting Orientation: : Oriented to Self, Oriented to Place, Oriented to  Time, Oriented to Situation Alcohol / Substance Use: Not Applicable Psych Involvement: No (comment)  Admission diagnosis:  RSV (acute bronchiolitis due to respiratory syncytial virus) [J21.0] RSV (respiratory syncytial virus pneumonia) [J12.1] AKI (acute kidney injury) ([place]) [N17.9] Severe comorbid illness [R69] Altered mental status, unspecified altered mental status type [R41.82] Respiratory failure with hypoxia and hypercapnia (HCC) [J96.91, J96.92] Patient Active Problem List   Diagnosis Date Noted   Malnutrition of moderate degree 03/01/2022   AKI (acute kidney injury) ([place]) 02/27/2022   Transaminitis 02/27/2022   Respiratory failure with hypoxia and hypercapnia ([place]) 02/27/2022   RSV (respiratory syncytial virus pneumonia) 02/26/2022   CHF exacerbation ([place]) 09/21/2020   CHF (congestive heart failure) ([place]) 09/20/2020   Exudative age-related macular degeneration of left eye with inactive choroidal neovascularization ([place]) 07/15/2019   Cystoid macular edema of left eye 05/26/2019  Moderate nonproliferative diabetic retinopathy of both eyes ([place]) 05/26/2019   Early stage nonexudative age-related macular degeneration of both eyes 05/26/2019   FUO (fever of unknown origin)    Acute CHF (congestive heart failure) ([place]) 05/06/2016   Hyperglycemia     CKD (chronic kidney disease), stage III ([place]) 03/17/2016   Hyperlipidemia associated with type 2 diabetes mellitus ([place])    COPD (chronic obstructive pulmonary disease) ([place]) 06/28/2015   Chronic diastolic heart failure ([place]) 06/28/2015   NICM (nonischemic cardiomyopathy) ([place]) 10/05/2012   Abdominal mass, LLQ (left lower quadrant) 05/03/2012   PULMONARY NODULE 08/05/2008   Palpitations 08/05/2008   Type 2 diabetes mellitus without complication, with long-term current use of insulin ([place]) 07/16/2008   Essential hypertension 07/16/2008   PCP:  Lujean Amel, MD Pharmacy:   [facility], [address] [place] [address] Phone: [phone] Fax: [phone]  [facility] [address] [place] [address] Phone: [phone] Fax: [phone]     Social Determinants of Health (SDOH) Social History: SDOH Screenings   Food Insecurity: No Food Insecurity (02/27/2022)  Housing: Low Risk  (02/27/2022)  Transportation Needs: No Transportation Needs (02/27/2022)  Utilities: Not At Risk (02/27/2022)  Tobacco Use: Medium Risk (02/26/2022)   SDOH Interventions:     Readmission Risk Interventions     No data to display

## 2022-03-01 NOTE — Progress Notes (Addendum)
Rounding Note    Patient Name: [name] Date of Encounter: 03/01/2022  [place] Cardiologist: Glenetta Hew, MD   Subjective   No acute overnight night. Patient states she is feeling better. She did have a long coughing spell last night which made her scared. She was given Robitussin and is doing better this morning. She is still having some. Shortness of breath improving. No chest pain. Renal function improving with diuresis.  Inpatient Medications    Scheduled Meds:  azithromycin  500 mg Oral QHS   carvedilol  3.125 mg Oral BID WC   Chlorhexidine Gluconate Cloth  6 each Topical Daily   enoxaparin (LOVENOX) injection  30 mg Subcutaneous Q24H   feeding supplement (GLUCERNA SHAKE)  237 mL Oral BID BM   insulin aspart  0-15 Units Subcutaneous TID WC   insulin aspart  0-5 Units Subcutaneous QHS   insulin NPH Human  10 Units Subcutaneous BID AC & HS   mupirocin ointment   Nasal BID   predniSONE  40 mg Oral Q breakfast   sacubitril-valsartan  1 tablet Oral BID   Continuous Infusions:  cefTRIAXone (ROCEPHIN)  IV 1 g (02/28/22 2212)   PRN Meds: acetaminophen **OR** acetaminophen, guaiFENesin-dextromethorphan, ipratropium-albuterol, ondansetron **OR** ondansetron (ZOFRAN) IV, mouth rinse   Vital Signs    Vitals:   02/28/22 1806 02/28/22 2202 03/01/22 0300 03/01/22 0555  BP: 126/69 (!) 144/68 132/78 138/78  Pulse: (!) 53 (!) 55 (!) 55   Resp:  '20 17 16  '$ Temp: 98 F (36.7 C) 98.4 F (36.9 C) 97.8 F (36.6 C) 98 F (36.7 C)  TempSrc: Oral Oral Oral Oral  SpO2: 98% 100% 96% 98%  Weight:      Height:        Intake/Output Summary (Last 24 hours) at 03/01/2022 0915 Last data filed at 03/01/2022 0557 Gross per 24 hour  Intake --  Output 375 ml  Net -375 ml      02/27/2022    4:00 AM 02/26/2022    5:42 PM 02/03/2022    3:39 PM  Last 3 Weights  Weight (lbs) 117 lb 11.6 oz 115 lb 119 lb  Weight (kg) 53.4 kg 52.164 kg 53.978 kg      Telemetry    Sinus  bradycardia with PVCs. Rates in the 58s. - Personally Reviewed  ECG    No new ECG tracing today. - Personally Reviewed  Physical Exam   GEN: African-American female resting comfortably in no acute distress.   Neck: No JVD. Cardiac: Mildly bradycardic with normal rhythm. No murmurs, rubs, or gallops.  Respiratory: No increased work of breathing. Rhonchi and course breath sounds throughout.  GI: Soft, non-distended, and non-tender. Bowel sounds present. MS: No lower extremity edema. No deformity. Skin: Warm and dry. Neuro:  No focal deficits. Psych: Normal affect. Responds appropriately.  Labs    High Sensitivity Troponin:  No results for input(s): "TROPONINIHS" in the last 720 hours.   Chemistry Recent Labs  Lab 02/26/22 1759 02/27/22 0401 02/28/22 0810 02/28/22 0815 03/01/22 0810  NA 135 133* 135  --  134*  K 4.2 4.5 4.0  --  3.8  CL 94* 96* 97*  --  94*  CO2 '29 28 29  '$ --  27  GLUCOSE 349* 184* 161*  --  204*  BUN 51* 55* 71*  --  65*  CREATININE 2.22* 2.25* 2.07*  --  1.80*  CALCIUM 9.3 8.6* 8.8*  --  8.8*  MG  --   --   --  2.2  --   PROT 6.5 5.7*  --   --   --   ALBUMIN 3.7 2.7*  --   --   --   AST 164* 147*  --   --   --   ALT 164* 155*  --   --   --   ALKPHOS 100 85  --   --   --   BILITOT 1.2 0.8  --   --   --   GFRNONAA 22* 22* 24*  --  28*  ANIONGAP '12 9 9  '$ --  13    Lipids No results for input(s): "CHOL", "TRIG", "HDL", "LABVLDL", "LDLCALC", "CHOLHDL" in the last 168 hours.  Hematology Recent Labs  Lab 02/26/22 1759 02/27/22 0401 02/28/22 0810  WBC 7.4 8.3 8.3  RBC 2.97* 2.91* 3.04*  HGB 9.3* 9.3* 9.3*  HCT 27.5* 27.6* 28.3*  MCV 92.6 94.8 93.1  MCH 31.3 32.0 30.6  MCHC 33.8 33.7 32.9  RDW 14.7 14.8 14.7  PLT 150 157 197   Thyroid No results for input(s): "TSH", "FREET4" in the last 168 hours.  BNP Recent Labs  Lab 02/26/22 1759  BNP >4,500.0*    DDimer No results for input(s): "DDIMER" in the last 168 hours.   Radiology     ECHOCARDIOGRAM COMPLETE  Result Date: 02/27/2022    ECHOCARDIOGRAM REPORT   Patient Name:   KOBI ALLER Date of Exam: 02/27/2022 Medical Rec #:  [id number]    Height:       63.5 in Accession #:    [id number]   Weight:       117.7 lb Date of Birth:  Dec 17, 1945    BSA:          1.553 m Patient Age:    55 years     BP:           150/84 mmHg Patient Gender: F            HR:           61 bpm. Exam Location:  Inpatient Procedure: 3D Echo, 2D Echo, Color Doppler and Cardiac Doppler Indications:    CHF-Acute Diastolic Q03.47  History:        Patient has prior history of Echocardiogram examinations, most                 recent 09/20/2020. CHF and Cardiomyopathy, CAD; Risk                 Factors:Diabetes. Past history of breast cancer.  Sonographer:    Darlina Sicilian RDCS Referring Phys: [place]  1. Left ventricular ejection fraction, by estimation, is 35 to 40%. The left ventricle has moderately decreased function. The left ventricle demonstrates global hypokinesis. The left ventricular internal cavity size was moderately dilated. Left ventricular diastolic parameters are consistent with Grade III diastolic dysfunction (restrictive).  2. Right ventricular systolic function is low normal. The right ventricular size is moderately enlarged. There is mildly elevated pulmonary artery systolic pressure. The estimated right ventricular systolic pressure is 42.5 mmHg.  3. Left atrial size was moderately dilated.  4. Right atrial size was moderately dilated.  5. The mitral valve is degenerative. Mild mitral valve regurgitation. No evidence of mitral stenosis.  6. The aortic valve is tricuspid. Aortic valve regurgitation is not visualized. No aortic stenosis is present.  7. The inferior vena cava is normal in size with <50% respiratory variability, suggesting right atrial pressure of 8 mmHg.  8. Evidence of atrial level shunting detected by color flow Doppler. There is a small patent foramen ovale with  predominantly left to right shunting across the atrial septum. Comparison(s): The left ventricular function is worsened. Prior EF 50-55%. FINDINGS  Left Ventricle: Left ventricular ejection fraction, by estimation, is 35 to 40%. The left ventricle has moderately decreased function. The left ventricle demonstrates global hypokinesis. 3D left ventricular ejection fraction analysis performed but not reported based on interpreter judgement due to suboptimal tracking. The left ventricular internal cavity size was moderately dilated. There is no left ventricular hypertrophy. Left ventricular diastolic parameters are consistent with Grade III diastolic dysfunction (restrictive). Right Ventricle: The right ventricular size is moderately enlarged. No increase in right ventricular wall thickness. Right ventricular systolic function is low normal. There is mildly elevated pulmonary artery systolic pressure. The tricuspid regurgitant  velocity is 2.79 m/s, and with an assumed right atrial pressure of 8 mmHg, the estimated right ventricular systolic pressure is 32.6 mmHg. Left Atrium: Left atrial size was moderately dilated. Right Atrium: Right atrial size was moderately dilated. Pericardium: There is no evidence of pericardial effusion. Mitral Valve: The mitral valve is degenerative in appearance. There is mild thickening of the mitral valve leaflet(s). Mild mitral annular calcification. Mild mitral valve regurgitation. No evidence of mitral valve stenosis. Tricuspid Valve: The tricuspid valve is grossly normal. Tricuspid valve regurgitation is mild . No evidence of tricuspid stenosis. Aortic Valve: The aortic valve is tricuspid. Aortic valve regurgitation is not visualized. No aortic stenosis is present. Pulmonic Valve: The pulmonic valve was grossly normal. Pulmonic valve regurgitation is not visualized. No evidence of pulmonic stenosis. Aorta: The aortic root and ascending aorta are structurally normal, with no evidence of  dilitation. Venous: The inferior vena cava is normal in size with less than 50% respiratory variability, suggesting right atrial pressure of 8 mmHg. IAS/Shunts: Evidence of atrial level shunting detected by color flow Doppler. A small patent foramen ovale is detected with predominantly left to right shunting across the atrial septum.  LEFT VENTRICLE PLAX 2D LVIDd:         4.90 cm   Diastology LVIDs:         3.90 cm   LV e' medial:    2.93 cm/s LV PW:         0.90 cm   LV E/e' medial:  34.8 LV IVS:        0.90 cm   LV e' lateral:   3.15 cm/s LVOT diam:     1.80 cm   LV E/e' lateral: 32.4 LV SV:         45 LV SV Index:   29 LVOT Area:     2.54 cm                           3D Volume EF:                          3D EF:        49 %                          LV EDV:       124 ml                          LV ESV:       63 ml  LV SV:        61 ml RIGHT VENTRICLE RV Basal diam:  4.40 cm RV Mid diam:    3.60 cm RV S prime:     8.89 cm/s TAPSE (M-mode): 1.5 cm LEFT ATRIUM             Index        RIGHT ATRIUM           Index LA diam:        4.40 cm 2.83 cm/m   RA Area:     12.20 cm LA Vol (A2C):   66.9 ml 43.08 ml/m  RA Volume:   27.70 ml  17.84 ml/m LA Vol (A4C):   60.6 ml 39.03 ml/m LA Biplane Vol: 65.1 ml 41.93 ml/m  AORTIC VALVE LVOT Vmax:   81.60 cm/s LVOT Vmean:  56.800 cm/s LVOT VTI:    0.177 m  AORTA Ao Root diam: 2.40 cm Ao Asc diam:  2.50 cm MITRAL VALVE                TRICUSPID VALVE MV Area (PHT): 4.49 cm     TR Peak grad:   31.1 mmHg MV Decel Time: 169 msec     TR Vmax:        279.00 cm/s MV E velocity: 102.00 cm/s MV A velocity: 27.40 cm/s   SHUNTS MV E/A ratio:  3.72         Systemic VTI:  0.18 m                             Systemic Diam: 1.80 cm Candee Furbish MD Electronically signed by Candee Furbish MD Signature Date/Time: 02/27/2022/2:43:01 PM    Final     Cardiac Studies   Echocardiogram 02/27/2022: Impressions: 1. Left ventricular ejection fraction, by estimation, is 35 to 40%.  The  left ventricle has moderately decreased function. The left ventricle  demonstrates global hypokinesis. The left ventricular internal cavity size  was moderately dilated. Left  ventricular diastolic parameters are consistent with Grade III diastolic  dysfunction (restrictive).   2. Right ventricular systolic function is low normal. The right  ventricular size is moderately enlarged. There is mildly elevated  pulmonary artery systolic pressure. The estimated right ventricular  systolic pressure is 24.2 mmHg.   3. Left atrial size was moderately dilated.   4. Right atrial size was moderately dilated.   5. The mitral valve is degenerative. Mild mitral valve regurgitation. No  evidence of mitral stenosis.   6. The aortic valve is tricuspid. Aortic valve regurgitation is not  visualized. No aortic stenosis is present.   7. The inferior vena cava is normal in size with <50% respiratory  variability, suggesting right atrial pressure of 8 mmHg.   8. Evidence of atrial level shunting detected by color flow Doppler.  There is a small patent foramen ovale with predominantly left to right  shunting across the atrial septum.   Comparison(s): The left ventricular function is worsened. Prior EF 50-55%.    Patient Profile     NARCISA GANESH is a 77 y.o. female with a history of non-obstructive CAD noted on cardiac catheterization in 11/2015, non-ischemic cardiomyopathy/ chronic combined CHF with EF as low as 20% in the past but improved to 50-55% on last Echo in 09/2020, palpitations with brief runs of SVT and frequent PVCs/PAC on monitor in 05/2016, remote DVT, hypertension, hyperlipidemia, type 2 diabetes mellitus on insulin, CKD  stage IIIb, remote breast cancer s/p lumpectomy and radiation as well as chemotherapy (Tamoxifen), and tobacco abuse  who is being seen 02/28/2022 for the evaluation of CHF at the request of Dr. Sloan Leiter.   Assessment & Plan    Acute on Chronic Combined CHF Non-Ischemic  Cardiomyopathy LVEF as low as 20% in 2018 but subsequently normalized to 50-55%. Patient presented with shortness of breath and hypoxia and was found to have RSV. However, BNP also markedly elevated at 4,500. Chest x-ray showed non-specific interstitial prominence consistent with atypical infection, edema, or interstitial lung disease as well as left mid lung consolidation which may represent early bronchopneumonia. Echo showed a drop in EF to 35-40% with global hypokinesis and grade III diastolic dysfunction, moderately enlarged RV with low normal systolic function, moderate bilateral enlargement, mild MR, mildly elevated PASP of 39.1 mmHg, and a small PFO with predominantly left to right shunting. She was given 1 dose of IV Lasix '20mg'$  yesterday with 375 cc of documented urinary output. Net positive 175 cc this admission. Renal function stable. - Patient feeling better today. She does not appear significantly volume overloaded on exam. Given improvement in breathing and renal function with diuresis, will give another dose of IV Lasix '20mg'$  daily today. Will give one dose of KCl 40 mEq with this. - Continue Entresto 49-'51mg'$  twice daily. - Home Coreg was decreased to 3.'125mg'$  twice daily due to bradycardia. - No Spironolactone or SGLT2 inhibitor for now due to renal function. GFR 24 yesterday and 28 today. May be able to add SGLT2 inhibitor prior to discharge.   Non-Obstructive CAD Noted on LHC in 11/2015.  - No chest pain.  - Continue home Lipitor '40mg'$  daily. - Does not appear that she has been on a statin. Given non-obstructive CAD and diabetes, consider starting Aspirin '81mg'$  daily although hemoglobin low this admission at 9.3.   Hypertension BP mostly well controlled. - Continue medications for CHF as above.   Hyperlipidemia LDL 67 in 08/2020. - Continue Lipitor '40mg'$  daily.    CKD Stage III Creatinine 2.22 on admission. Recent baseline around 1.4 to 1.7.  - Creatinine was 2.07 yesterday.  Today's labs pending. - Continue to monitor closely.  Otherwise, per primary team: - Acute hypoxic respiratory failure secondary to RSV with superimposed bacterial pneumonia - Uncontrolled type 2 diabetes mellitus: hemoglobin A1c 13% this admission - Deconditioning  For questions or updates, please contact [place] Please consult [email or web address] for contact info under        Signed, Darreld Mclean, PA-C  03/01/2022, 9:15 AM

## 2022-03-01 NOTE — Progress Notes (Addendum)
Physical Therapy Treatment Patient Details Name: [name] MRN: [id number] DOB: 15-Oct-1945 Today's Date: 03/01/2022   History of Present Illness patient is a 77 year old female admitted with RSV. Hx of neuropathy, respiratory failure, CHF, COPD, cardiomyopathy, DM, CKD    PT Comments    Pt agreeable to working with therapy. She reports she has chosen to decline [facility] f/u. She remains unsteady when ambulating. O2 >90% on RA during session. Recommend RW use for ambulation safety.  Recommendations for follow up therapy are one component of a multi-disciplinary discharge planning process, led by the attending physician.  Recommendations may be updated based on patient status, additional functional criteria and insurance authorization.  Follow Up Recommendations  Home health PT (pt declines HH f/u per chart review)     Assistance Recommended at Discharge Intermittent Supervision/Assistance  Patient can return home with the following A little help with walking and/or transfers;A little help with bathing/dressing/bathroom;Assistance with cooking/housework;Assist for transportation;Help with stairs or ramp for entrance   Equipment Recommendations  None recommended by PT (pt reports she already has a walker)    Recommendations for Other Services OT consult     Precautions / Restrictions Precautions Precautions: Fall Restrictions Weight Bearing Restrictions: No     Mobility  Bed Mobility Overal bed mobility: Modified Independent                  Transfers Overall transfer level: Needs assistance Equipment used: Rollator (4 wheels) Transfers: Sit to/from Stand Sit to Stand: Min guard           General transfer comment: Min guard for safety. Unsteady.    Ambulation/Gait Ambulation/Gait assistance: Min guard Gait Distance (Feet): 500 Feet Assistive device: Rollator (4 wheels) Gait Pattern/deviations: Step-through pattern, Decreased stride length       General Gait  Details: Min A to steady intermittently. Pt able to converse while ambulating. O2>90% on RA   Stairs             Wheelchair Mobility    Modified Rankin (Stroke Patients Only)       Balance Overall balance assessment: Needs assistance         Standing balance support: During functional activity Standing balance-Leahy Scale: Poor                              Cognition Arousal/Alertness: Awake/alert Behavior During Therapy: WFL for tasks assessed/performed Overall Cognitive Status: Within Functional Limits for tasks assessed                                 General Comments: AxO x 3 very pleasant        Exercises      General Comments        Pertinent Vitals/Pain Pain Assessment Pain Assessment: No/denies pain    Home Living                          Prior Function            PT Goals (current goals can now be found in the care plan section) Progress towards PT goals: Progressing toward goals    Frequency    Min 3X/week      PT Plan Current plan remains appropriate    Co-evaluation  AM-PAC PT "6 Clicks" Mobility   Outcome Measure  Help needed turning from your back to your side while in a flat bed without using bedrails?: None Help needed moving from lying on your back to sitting on the side of a flat bed without using bedrails?: None Help needed moving to and from a bed to a chair (including a wheelchair)?: A Little Help needed standing up from a chair using your arms (e.g., wheelchair or bedside chair)?: A Little Help needed to walk in hospital room?: A Little Help needed climbing 3-5 steps with a railing? : A Little 6 Click Score: 20    End of Session Equipment Utilized During Treatment: Gait belt Activity Tolerance: Patient tolerated treatment well Patient left: in bed;with call bell/phone within reach   PT Visit Diagnosis: Unsteadiness on feet (R26.81);Difficulty in walking,  not elsewhere classified (R26.2)     Time: 9774-1423 PT Time Calculation (min) (ACUTE ONLY): 22 min  Charges:  $Gait Training: 8-22 mins                         Doreatha Massed, PT Acute Rehabilitation  Office: [phone]

## 2022-03-01 NOTE — Progress Notes (Signed)
PROGRESS NOTE    [name]  IHK:[id number] DOB: [birth year] DOA: 02/26/2022 PCP: Lujean Amel, MD    Brief Narrative:  77 year old with nonocclusive CAD, nonischemic cardiomyopathy with recent ejection fraction 55%, hypertension, type 2 diabetes and CKD stage IIIb with baseline creatinine about 1.5 presented with about 4 days of cough congestion, drowsy and sleeping more. Poor oral intake. At urgent care, 86% on room air. Sent to ER. Found to have AKI, positive for RSV, procalcitonin more than 5 .  BNP was greatly elevated.  Clinically dehydrated on presentation.   Assessment & Plan:   RSV pneumonia with superadded bacterial pneumonia: continue Rocephin and azithromycin given very high procalcitonin.  Will continue IV.  Change to oral antibiotics on discharge. Chest physiotherapy, incentive spirometry, deep breathing exercises, sputum induction, mucolytic's and bronchodilators.  Mobilize in the hallway. Sputum cultures, blood cultures, Legionella and streptococcal antigen negative. Supplemental oxygen to keep saturations more than 90%.  Mostly on room air. Stop further steroids.  Acute kidney injury with history of CKD stage IIIb: Recent baseline creatinine of 1.5-2.  Presented with clinical dehydration.  Treated with IV fluids and now stopped. Creatinine 2.25-2.07-1.8 and continues to improve.  IV fluids stopped.  Getting intermittent doses of IV Lasix.  Tolerating well.  Acute on chronic combined heart failure: History of nonischemic cardiomyopathy with ejection fraction 20%, improved to 55%. Echocardiogram with ejection fraction 35 to 75%, grade 3 diastolic dysfunction. BNP more than 4500 on presentation. stabilization of renal functions today. With improvement of renal functions, started back on carvedilol reduced doses, Entresto started today.  Lasix IV intermittent doses.  Clinically stable and euvolemic.  Cardiology following.  Type 2 diabetes on insulin: Uncontrolled with  hyperglycemia.  Hemoglobin A1c 13.  On NPH insulin at home.  Continued.  Anticipate fluctuating levels due to poor appetite.  She takes Novolin 70/30 at home that is 22 in the morning and 12 in the evening.  Currently on Novolin N 10 units twice daily.  Will add prandial insulin today.  She can go back on her 70/30 insulin on discharge.  Debility and deconditioning: Work with PT OT.  Mobility is improving.  Recommended home health PT OT however patient declined.   DVT prophylaxis: enoxaparin (LOVENOX) injection 30 mg Start: 02/27/22 1000   Code Status: Full code Family Communication: Daughter Sharyn Lull on the phone 1/9. Disposition Plan: Status is: Inpatient Remains inpatient appropriate because: Significant shortness of breath.  Acute renal failure and acute heart failure.  IV diuresis.     Consultants:  Cardiology  Procedures:  None  Antimicrobials:  Rocephin and azithromycin 1/7---   Subjective: Patient seen and examined.  She had a coughing fit last night but since feels better.  Strength is improving.  Denies any shortness of breath today.  She was so happy to hear that she may go home tomorrow.  Objective: Vitals:   02/28/22 2202 03/01/22 0300 03/01/22 0555 03/01/22 1005  BP: (!) 144/68 132/78 138/78 135/75  Pulse: (!) 55 (!) 55  60  Resp: '20 17 16 18  '$ Temp: 98.4 F (36.9 C) 97.8 F (36.6 C) 98 F (36.7 C)   TempSrc: Oral Oral Oral   SpO2: 100% 96% 98%   Weight:      Height:        Intake/Output Summary (Last 24 hours) at 03/01/2022 1126 Last data filed at 03/01/2022 0557 Gross per 24 hour  Intake --  Output 375 ml  Net -375 ml    [employer]  02/26/22 1742 02/27/22 0400  Weight: 52.2 kg 53.4 kg    Examination:  General exam: Appears calm and comfortable.  On room air today.  Pleasant and interactive. Alert oriented x 4.   Respiratory system: Clear to auscultation. Respiratory effort normal.  Occasional upper airway conducted sounds.  On room air.   Cardiovascular system: S1 & S2 heard, RRR.  No pedal edema.  No Gastrointestinal system: Abdomen is nondistended, soft and nontender. No organomegaly or masses felt. Normal bowel sounds heard. Central nervous system: Alert and oriented. No focal neurological deficits. Extremities: Symmetric 5 x 5 power.     Data Reviewed: I have personally reviewed following labs and imaging studies  CBC: Recent Labs  Lab 02/26/22 1759 02/27/22 0401 02/28/22 0810  WBC 7.4 8.3 8.3  NEUTROABS 5.9  --  6.7  HGB 9.3* 9.3* 9.3*  HCT 27.5* 27.6* 28.3*  MCV 92.6 94.8 93.1  PLT 150 157 093    Basic Metabolic Panel: Recent Labs  Lab 02/26/22 1759 02/27/22 0401 02/28/22 0810 02/28/22 0815 03/01/22 0810  NA 135 133* 135  --  134*  K 4.2 4.5 4.0  --  3.8  CL 94* 96* 97*  --  94*  CO2 '29 28 29  '$ --  27  GLUCOSE 349* 184* 161*  --  204*  BUN 51* 55* 71*  --  65*  CREATININE 2.22* 2.25* 2.07*  --  1.80*  CALCIUM 9.3 8.6* 8.8*  --  8.8*  MG  --   --   --  2.2  --     GFR: Estimated Creatinine Clearance: 21.7 mL/min (A) (by C-G formula based on SCr of 1.8 mg/dL (H)). Liver Function Tests: Recent Labs  Lab 02/26/22 1759 02/27/22 0401  AST 164* 147*  ALT 164* 155*  ALKPHOS 100 85  BILITOT 1.2 0.8  PROT 6.5 5.7*  ALBUMIN 3.7 2.7*    No results for input(s): "LIPASE", "AMYLASE" in the last 168 hours. No results for input(s): "AMMONIA" in the last 168 hours. Coagulation Profile: No results for input(s): "INR", "PROTIME" in the last 168 hours. Cardiac Enzymes: No results for input(s): "CKTOTAL", "CKMB", "CKMBINDEX", "TROPONINI" in the last 168 hours. BNP (last 3 results) No results for input(s): "PROBNP" in the last 8760 hours. HbA1C: Recent Labs    02/27/22 0401  HGBA1C 13.3*    CBG: Recent Labs  Lab 02/28/22 0822 02/28/22 1129 02/28/22 1603 02/28/22 2113 03/01/22 0819  GLUCAP 162* 246* 210* 233* 194*    Lipid Profile: No results for input(s): "CHOL", "HDL", "LDLCALC",  "TRIG", "CHOLHDL", "LDLDIRECT" in the last 72 hours. Thyroid Function Tests: No results for input(s): "TSH", "T4TOTAL", "FREET4", "T3FREE", "THYROIDAB" in the last 72 hours. Anemia Panel: No results for input(s): "VITAMINB12", "FOLATE", "FERRITIN", "TIBC", "IRON", "RETICCTPCT" in the last 72 hours. Sepsis Labs: Recent Labs  Lab 02/26/22 2200 02/27/22 0401  PROCALCITON  --  5.43  LATICACIDVEN 1.4  --      Recent Results (from the past 240 hour(s))  Resp panel by RT-PCR (RSV, Flu A&B, Covid) Anterior Nasal Swab     Status: Abnormal   Collection Time: 02/26/22  5:59 PM   Specimen: Anterior Nasal Swab  Result Value Ref Range Status   SARS Coronavirus 2 by RT PCR NEGATIVE NEGATIVE Final    Comment: (NOTE) SARS-CoV-2 target nucleic acids are NOT DETECTED.  The SARS-CoV-2 RNA is generally detectable in upper respiratory specimens during the acute phase of infection. The lowest concentration of SARS-CoV-2 viral copies this assay  can detect is 138 copies/mL. A negative result does not preclude SARS-Cov-2 infection and should not be used as the sole basis for treatment or other patient management decisions. A negative result may occur with  improper specimen collection/handling, submission of specimen other than nasopharyngeal swab, presence of viral mutation(s) within the areas targeted by this assay, and inadequate number of viral copies(<138 copies/mL). A negative result must be combined with clinical observations, patient history, and epidemiological information. The expected result is Negative.  Fact Sheet for Patients:  EntrepreneurPulse.com.au  Fact Sheet for Healthcare Providers:  IncredibleEmployment.be  This test is no t yet approved or cleared by the [place] FDA and  has been authorized for detection and/or diagnosis of SARS-CoV-2 by FDA under an Emergency Use Authorization (EUA). This EUA will remain  in effect (meaning this  test can be used) for the duration of the COVID-19 declaration under Section 564(b)(1) of the Act, 21 U.S.C.section 360bbb-3(b)(1), unless the authorization is terminated  or revoked sooner.       Influenza A by PCR NEGATIVE NEGATIVE Final   Influenza B by PCR NEGATIVE NEGATIVE Final    Comment: (NOTE) The Xpert Xpress SARS-CoV-2/FLU/RSV plus assay is intended as an aid in the diagnosis of influenza from Nasopharyngeal swab specimens and should not be used as a sole basis for treatment. Nasal washings and aspirates are unacceptable for Xpert Xpress SARS-CoV-2/FLU/RSV testing.  Fact Sheet for Patients: EntrepreneurPulse.com.au  Fact Sheet for Healthcare Providers: IncredibleEmployment.be  This test is not yet approved or cleared by the [place] FDA and has been authorized for detection and/or diagnosis of SARS-CoV-2 by FDA under an Emergency Use Authorization (EUA). This EUA will remain in effect (meaning this test can be used) for the duration of the COVID-19 declaration under Section 564(b)(1) of the Act, 21 U.S.C. section 360bbb-3(b)(1), unless the authorization is terminated or revoked.     Resp Syncytial Virus by PCR POSITIVE (A) NEGATIVE Final    Comment: (NOTE) Fact Sheet for Patients: EntrepreneurPulse.com.au  Fact Sheet for Healthcare Providers: IncredibleEmployment.be  This test is not yet approved or cleared by the [place] FDA and has been authorized for detection and/or diagnosis of SARS-CoV-2 by FDA under an Emergency Use Authorization (EUA). This EUA will remain in effect (meaning this test can be used) for the duration of the COVID-19 declaration under Section 564(b)(1) of the Act, 21 U.S.C. section 360bbb-3(b)(1), unless the authorization is terminated or revoked.  Performed at [facility], [address] [place], [place] [address]   Culture, blood  (routine x 2)     Status: None (Preliminary result)   Collection Time: 02/26/22 10:00 PM   Specimen: BLOOD  Result Value Ref Range Status   Specimen Description   Final    BLOOD BLOOD RIGHT ARM Performed at [facility], [address], [place], [place] [address]    Special Requests   Final    BOTTLES DRAWN AEROBIC AND ANAEROBIC Blood Culture adequate volume Performed at [facility], [address], [place], [place] [address]    Culture   Final    NO GROWTH 2 DAYS Performed at [place] [address] [place], [place] [address]    Report Status PENDING  Incomplete  Culture, blood (routine x 2)     Status: None (Preliminary result)   Collection Time: 02/26/22 10:04 PM   Specimen: BLOOD  Result Value Ref Range Status   Specimen Description   Final    BLOOD  LEFT ANTECUBITAL Performed at [facility], [address], [place], [place] [address]    Special Requests   Final    BOTTLES DRAWN AEROBIC AND ANAEROBIC Blood Culture results may not be optimal due to an excessive volume of blood received in culture bottles Performed at [facility], [address] [place], [place] [address]    Culture   Final    NO GROWTH 2 DAYS Performed at [facility], [place] [address], [place], [place] [address]    Report Status PENDING  Incomplete  MRSA Next Gen by PCR, Nasal     Status: Abnormal   Collection Time: 02/27/22  1:46 AM   Specimen: Nasal Mucosa; Nasal Swab  Result Value Ref Range Status   MRSA by PCR Next Gen DETECTED (A) NOT DETECTED Final    Comment: CRITICAL RESULT CALLED TO, READ BACK BY AND VERIFIED WITH: WILLIS,S AT 9628 ON 02/27/22 BY LUZOLOP (NOTE) The GeneXpert MRSA Assay (FDA approved for NASAL specimens only), is one component of a comprehensive MRSA colonization surveillance program. It is not intended to diagnose MRSA infection nor to guide or monitor treatment for MRSA  infections. Test performance is not FDA approved in patients less than 46 years old. Performed at [facility], [place] [address], [place], [place] [address]   Expectorated Sputum Assessment w Gram Stain, Rflx to Resp Cult     Status: None   Collection Time: 02/27/22 12:41 PM   Specimen: Sputum  Result Value Ref Range Status   Specimen Description SPUTUM  Final   Special Requests Normal  Final   Sputum evaluation   Final    THIS SPECIMEN IS ACCEPTABLE FOR SPUTUM CULTURE Performed at [facility], [place] [address] [place], [place] [address]    Report Status 02/27/2022 FINAL  Final  Culture, Respiratory w Gram Stain     Status: None (Preliminary result)   Collection Time: 02/27/22 12:41 PM   Specimen: SPU  Result Value Ref Range Status   Specimen Description   Final    SPUTUM Performed at [place] [address], [place], [place] [address]    Special Requests   Final    Normal Reflexed from [id number] Performed at [facility], [place] [address], [place], [place] [address]    Gram Stain   Final    NO WBC SEEN FEW GRAM POSITIVE COCCI IN PAIRS RARE GRAM POSITIVE RODS    Culture   Final    CULTURE REINCUBATED FOR BETTER GROWTH Performed at [facility], [place] [address] [place], [place] [address]    Report Status PENDING  Incomplete         Radiology Studies: ECHOCARDIOGRAM COMPLETE  Result Date: 02/27/2022    ECHOCARDIOGRAM REPORT   Patient Name:   [name] Date of Exam: 02/27/2022 Medical Rec #:  [id number]    Height:       63.5 in Accession #:    [id number]   Weight:       117.7 lb Date of Birth:  Sep 24, 1945    BSA:          1.553 m Patient Age:    40 years     BP:           150/84 mmHg Patient Gender: F            HR:           61 bpm. Exam Location:  Inpatient Procedure: 3D Echo, 2D Echo, Color Doppler and Cardiac Doppler Indications:    CHF-Acute Diastolic B51.02  History:        Patient has  prior history of Echocardiogram examinations, most                 recent 09/20/2020. CHF and Cardiomyopathy, CAD; Risk                 Factors:Diabetes. Past history of breast cancer.  Sonographer:    Darlina Sicilian RDCS Referring Phys: [place]  1. Left ventricular ejection fraction, by estimation, is 35 to 40%. The left ventricle has moderately decreased function. The left ventricle demonstrates global hypokinesis. The left ventricular internal cavity size was moderately dilated. Left ventricular diastolic parameters are consistent with Grade III diastolic dysfunction (restrictive).  2. Right ventricular systolic function is low normal. The right ventricular size is moderately enlarged. There is mildly elevated pulmonary artery systolic pressure. The estimated right ventricular systolic pressure is 58.5 mmHg.  3. Left atrial size was moderately dilated.  4. Right atrial size was moderately dilated.  5. The mitral valve is degenerative. Mild mitral valve regurgitation. No evidence of mitral stenosis.  6. The aortic valve is tricuspid. Aortic valve regurgitation is not visualized. No aortic stenosis is present.  7. The inferior vena cava is normal in size with <50% respiratory variability, suggesting right atrial pressure of 8 mmHg.  8. Evidence of atrial level shunting detected by color flow Doppler. There is a small patent foramen ovale with predominantly left to right shunting across the atrial septum. Comparison(s): The left ventricular function is worsened. Prior EF 50-55%. FINDINGS  Left Ventricle: Left ventricular ejection fraction, by estimation, is 35 to 40%. The left ventricle has moderately decreased function. The left ventricle demonstrates global hypokinesis. 3D left ventricular ejection fraction analysis performed but not reported based on interpreter judgement due to suboptimal tracking. The left ventricular internal cavity size was moderately dilated. There is no left  ventricular hypertrophy. Left ventricular diastolic parameters are consistent with Grade III diastolic dysfunction (restrictive). Right Ventricle: The right ventricular size is moderately enlarged. No increase in right ventricular wall thickness. Right ventricular systolic function is low normal. There is mildly elevated pulmonary artery systolic pressure. The tricuspid regurgitant  velocity is 2.79 m/s, and with an assumed right atrial pressure of 8 mmHg, the estimated right ventricular systolic pressure is 27.7 mmHg. Left Atrium: Left atrial size was moderately dilated. Right Atrium: Right atrial size was moderately dilated. Pericardium: There is no evidence of pericardial effusion. Mitral Valve: The mitral valve is degenerative in appearance. There is mild thickening of the mitral valve leaflet(s). Mild mitral annular calcification. Mild mitral valve regurgitation. No evidence of mitral valve stenosis. Tricuspid Valve: The tricuspid valve is grossly normal. Tricuspid valve regurgitation is mild . No evidence of tricuspid stenosis. Aortic Valve: The aortic valve is tricuspid. Aortic valve regurgitation is not visualized. No aortic stenosis is present. Pulmonic Valve: The pulmonic valve was grossly normal. Pulmonic valve regurgitation is not visualized. No evidence of pulmonic stenosis. Aorta: The aortic root and ascending aorta are structurally normal, with no evidence of dilitation. Venous: The inferior vena cava is normal in size with less than 50% respiratory variability, suggesting right atrial pressure of 8 mmHg. IAS/Shunts: Evidence of atrial level shunting detected by color flow Doppler. A small patent foramen ovale is detected with predominantly left to right shunting across the atrial septum.  LEFT VENTRICLE PLAX 2D LVIDd:  4.90 cm   Diastology LVIDs:         3.90 cm   LV e' medial:    2.93 cm/s LV PW:         0.90 cm   LV E/e' medial:  34.8 LV IVS:        0.90 cm   LV e' lateral:   3.15 cm/s LVOT  diam:     1.80 cm   LV E/e' lateral: 32.4 LV SV:         45 LV SV Index:   29 LVOT Area:     2.54 cm                           3D Volume EF:                          3D EF:        49 %                          LV EDV:       124 ml                          LV ESV:       63 ml                          LV SV:        61 ml RIGHT VENTRICLE RV Basal diam:  4.40 cm RV Mid diam:    3.60 cm RV S prime:     8.89 cm/s TAPSE (M-mode): 1.5 cm LEFT ATRIUM             Index        RIGHT ATRIUM           Index LA diam:        4.40 cm 2.83 cm/m   RA Area:     12.20 cm LA Vol (A2C):   66.9 ml 43.08 ml/m  RA Volume:   27.70 ml  17.84 ml/m LA Vol (A4C):   60.6 ml 39.03 ml/m LA Biplane Vol: 65.1 ml 41.93 ml/m  AORTIC VALVE LVOT Vmax:   81.60 cm/s LVOT Vmean:  56.800 cm/s LVOT VTI:    0.177 m  AORTA Ao Root diam: 2.40 cm Ao Asc diam:  2.50 cm MITRAL VALVE                TRICUSPID VALVE MV Area (PHT): 4.49 cm     TR Peak grad:   31.1 mmHg MV Decel Time: 169 msec     TR Vmax:        279.00 cm/s MV E velocity: 102.00 cm/s MV A velocity: 27.40 cm/s   SHUNTS MV E/A ratio:  3.72         Systemic VTI:  0.18 m                             Systemic Diam: 1.80 cm Candee Furbish MD Electronically signed by Candee Furbish MD Signature Date/Time: 02/27/2022/2:43:01 PM    Final         Scheduled Meds:  azithromycin  500 mg Oral QHS   carvedilol  3.125 mg Oral BID WC   Chlorhexidine Gluconate Cloth  6  each Topical Daily   enoxaparin (LOVENOX) injection  30 mg Subcutaneous Q24H   feeding supplement (GLUCERNA SHAKE)  237 mL Oral BID BM   insulin aspart  0-15 Units Subcutaneous TID WC   insulin aspart  0-5 Units Subcutaneous QHS   insulin aspart  4 Units Subcutaneous TID WC   insulin NPH Human  10 Units Subcutaneous BID AC & HS   mupirocin ointment   Nasal BID   sacubitril-valsartan  1 tablet Oral BID   Continuous Infusions:  cefTRIAXone (ROCEPHIN)  IV 1 g (02/28/22 2212)     LOS: 2 days    Time spent: 35 minutes    Barb Merino, MD [facility] Pager [phone]

## 2022-03-02 DIAGNOSIS — J121 Respiratory syncytial virus pneumonia: Secondary | ICD-10-CM | POA: Diagnosis not present

## 2022-03-02 LAB — GLUCOSE, CAPILLARY
Glucose-Capillary: 100 mg/dL — ABNORMAL HIGH (ref 70–99)
Glucose-Capillary: 134 mg/dL — ABNORMAL HIGH (ref 70–99)
Glucose-Capillary: 70 mg/dL (ref 70–99)
Glucose-Capillary: 160 mg/dL — ABNORMAL HIGH (ref 70–99)
Glucose-Capillary: 220 mg/dL — ABNORMAL HIGH (ref 70–99)

## 2022-03-02 LAB — BASIC METABOLIC PANEL
Anion gap: 7 (ref 5–15)
BUN: 64 mg/dL — ABNORMAL HIGH (ref 8–23)
CO2: 27 mmol/L (ref 22–32)
Calcium: 8.3 mg/dL — ABNORMAL LOW (ref 8.9–10.3)
Chloride: 100 mmol/L (ref 98–111)
Creatinine, Ser: 1.45 mg/dL — ABNORMAL HIGH (ref 0.44–1.00)
GFR, Estimated: 37 mL/min — ABNORMAL LOW (ref >60)
Glucose, Bld: 130 mg/dL — ABNORMAL HIGH (ref 70–99)
Potassium: 3.9 mmol/L (ref 3.5–5.1)
Sodium: 134 mmol/L — ABNORMAL LOW (ref 135–145)

## 2022-03-02 LAB — CULTURE, RESPIRATORY W GRAM STAIN
Gram Stain: NONE SEEN
Special Requests: NORMAL

## 2022-03-02 NOTE — Plan of Care (Addendum)

## 2022-03-02 NOTE — Progress Notes (Addendum)
PROGRESS NOTE    [name]  OZH:[id number]  DOB: 08/17/1945  DOA: 02/26/2022 PCP: Lujean Amel, MD Outpatient Specialists:   Hospital course:  77 year old female with nonischemic cardiomyopathy, HTN, DM 2, CKD stage IIIb was admitted with CAP complicated by RSV.   Subjective:  Patient thinks that she is improving and that she has significantly decreased cough.  Patient denies shortness of breath either now or previously.  Notes she does not feel she is ready to go home just yet.   Objective: Vitals:   03/01/22 1756 03/01/22 2022 03/02/22 0446 03/02/22 0945  BP: (!) 142/70 129/60 115/67 (!) 146/65  Pulse: 62 62 (!) 55 63  Resp:   18   Temp:  97.9 F (36.6 C) 97.9 F (36.6 C)   TempSrc:  Oral Oral   SpO2:  97% 100%   Weight:      Height:        Intake/Output Summary (Last 24 hours) at 03/02/2022 1501 Last data filed at 03/02/2022 0900 Gross per 24 hour  Intake 360 ml  Output 301 ml  Net 59 ml   Filed Weights   02/26/22 1742 02/27/22 0400  Weight: 52.2 kg 53.4 kg     Exam:  General: Well-appearing female sitting up in bed with intermittent cough, cough worsened with conversation. Eyes: sclera anicteric, conjuctiva mild injection bilaterally CVS: S1-S2, regular  Respiratory:  decreased air entry bilaterally with scattered low pitched expiratory wheeze in all lung fields. GI: NABS, soft, NT  LE: Warm and well-perfused Neuro: A/O x 3,  grossly nonfocal.  Psych: patient is logical and coherent, judgement and insight appear normal, mood and affect appropriate to situation.  Data Reviewed:  Basic Metabolic Panel: Recent Labs  Lab 02/26/22 1759 02/27/22 0401 02/28/22 0810 02/28/22 0815 03/01/22 0810 03/02/22 0430  NA 135 133* 135  --  134* 134*  K 4.2 4.5 4.0  --  3.8 3.9  CL 94* 96* 97*  --  94* 100  CO2 '29 28 29  '$ --  27 27  GLUCOSE 349* 184* 161*  --  204* 130*  BUN 51* 55* 71*  --  65* 64*  CREATININE 2.22* 2.25* 2.07*  --  1.80* 1.45*   CALCIUM 9.3 8.6* 8.8*  --  8.8* 8.3*  MG  --   --   --  2.2  --   --     CBC: Recent Labs  Lab 02/26/22 1759 02/27/22 0401 02/28/22 0810  WBC 7.4 8.3 8.3  NEUTROABS 5.9  --  6.7  HGB 9.3* 9.3* 9.3*  HCT 27.5* 27.6* 28.3*  MCV 92.6 94.8 93.1  PLT 150 157 197     Scheduled Meds:  azithromycin  500 mg Oral QHS   carvedilol  3.125 mg Oral BID WC   enoxaparin (LOVENOX) injection  30 mg Subcutaneous Q24H   feeding supplement (GLUCERNA SHAKE)  237 mL Oral BID BM   insulin aspart  0-15 Units Subcutaneous TID WC   insulin aspart  0-5 Units Subcutaneous QHS   insulin aspart  4 Units Subcutaneous TID WC   insulin NPH Human  10 Units Subcutaneous BID AC & HS   mupirocin ointment   Nasal BID   sacubitril-valsartan  1 tablet Oral BID   Continuous Infusions:  cefTRIAXone (ROCEPHIN)  IV 1 g (03/01/22 2311)     Assessment & Plan:   CAP complicated by RSV Patient seems to be improving clinically with less cough Continue ceftriaxone and azithromycin Rare wheeze, treat  with inhaled bronchodilators  Nonischemic cardiomyopathy Decompensated acute on chronic combined heart failure Patient states she is doing much better with decreased lower extremity edema As an outpatient, they had apparently decreased her Lasix dose At present patient appears to be euvolemic Continue Entresto and carvedilol Of note echo done on 02/27/2022 showed EF 35 to 40% whereas echocardiogram from 09/20/2020 showed EF of 50 to 55%, prior to that she had an EF of 20%. Patient is being actively followed by cardiology who think reduced EF is likely secondary to RSV They will see her in follow-up as an outpatient  Acute on chronic kidney disease Improving with treatment of infection and decompensated heart failure Recent Labs  Lab 02/03/22 1625 02/26/22 1759 02/27/22 0401 02/28/22 0810 03/01/22 0810 03/02/22 0430  CREATININE 1.73* 2.22* 2.25* 2.07* 1.80* 1.45*   DM 2 Watts sugars under reasonable control  on present regimen Of note, hemoglobin A1c was 13 Patient will need further education about the importance of blood sugar control Will place diabetes educator consultation    DVT prophylaxis: Lovenox Code Status: Full Family Communication: Patient states she is in communication with her family    Studies: No results found.  Principal Problem:   RSV (respiratory syncytial virus pneumonia) Active Problems:   Chronic diastolic heart failure (HCC)   AKI (acute kidney injury) ([place])   Transaminitis   Type 2 diabetes mellitus without complication, with long-term current use of insulin (HCC)   Essential hypertension   CKD (chronic kidney disease), stage III ([place])   Respiratory failure with hypoxia and hypercapnia ([place])   Malnutrition of moderate degree     Taejon Irani Tublu Daxson Reffett, [facility]  If 7PM-7AM, please contact night-coverage [email or web address]   LOS: 3 days

## 2022-03-02 NOTE — Progress Notes (Signed)
Mobility Specialist - Progress Note   03/02/22 1341  Mobility  Activity Ambulated with assistance in hallway  Level of Assistance Contact guard assist, steadying assist  Assistive Device None  Distance Ambulated (ft) 200 ft  Range of Motion/Exercises Active  Activity Response Tolerated well  Mobility Referral Yes  $Mobility charge 1 Mobility   Pt was found on recliner chair and agreeable to ambulate. Had x1 LOB during session at ~ 100 ft where she needed HHA to corrrect. She stated she felt her legs were weak. At EOS returned to recliner chair with necessities in reach.  [name] Mobility Specialist

## 2022-03-02 NOTE — Progress Notes (Signed)
Made MD aware that patient had a 7 beat run of V-Tach. No new orders at this time. Will continue to monitor patient.

## 2022-03-02 NOTE — Progress Notes (Signed)
Occupational Therapy Treatment Patient Details Name: JADEE GOLEBIEWSKI MRN: [id number] DOB: [birth year] Today's Date: 03/02/2022   History of present illness Patient is a 77 year old female admitted with RSV. Hx of neuropathy, respiratory failure, CHF, COPD, cardiomyopathy, DM, CKD   OT comments  The patient presented with good participation. She was educated on the importance of maintaining regular functional activity to decrease the risk for further deconditioning. She was also educated on implementing simple energy conservation strategies as needed during ADLs. She was assisted to the bedside chair where she initiated self-feeding. She will likely only require 1-2 more OT visits during her hospital stay.    Recommendations for follow up therapy are one component of a multi-disciplinary discharge planning process, led by the attending physician.  Recommendations may be updated based on patient status, additional functional criteria and insurance authorization.    Follow Up Recommendations  No OT follow up     Assistance Recommended at Discharge PRN  Patient can return home with the following  Assist for transportation;Assistance with cooking/housework   Equipment Recommendations  None recommended by OT       Precautions / Restrictions Precautions Precaution Comments: monitor vitals Restrictions Weight Bearing Restrictions: No       Mobility Bed Mobility Overal bed mobility: Modified Independent Bed Mobility: Supine to Sit     Supine to sit: Modified independent (Device/Increase time)          Transfers Overall transfer level: Needs assistance   Transfers: Sit to/from Stand Sit to Stand: Supervision     Step pivot transfers: Supervision               ADL either performed or assessed with clinical judgement   ADL Overall ADL's : Needs assistance/impaired Eating/Feeding: Independent;Sitting   Grooming: Set up;Sitting           Upper Body Dressing :  Sitting;Set up   Lower Body Dressing: Supervision/safety                        Cognition Arousal/Alertness: Awake/alert Behavior During Therapy: WFL for tasks assessed/performed Overall Cognitive Status: Within Functional Limits for tasks assessed                      Pertinent Vitals/ Pain       Pain Assessment Pain Assessment: No/denies pain         Frequency  Min 1X/week        Progress Toward Goals  OT Goals(current goals can now be found in the care plan section)  Progress towards OT goals: Progressing toward goals  Acute Rehab OT Goals Patient Stated Goal: to get better and return home OT Goal Formulation: With patient Time For Goal Achievement: 03/14/22 Potential to Achieve Goals: Good  Plan Discharge plan needs to be updated       AM-PAC OT "6 Clicks" Daily Activity     Outcome Measure   Help from another person eating meals?: None Help from another person taking care of personal grooming?: None Help from another person toileting, which includes using toliet, bedpan, or urinal?: A Little Help from another person bathing (including washing, rinsing, drying)?: A Little Help from another person to put on and taking off regular upper body clothing?: None Help from another person to put on and taking off regular lower body clothing?: A Little 6 Click Score: 21    End of Session    OT Visit Diagnosis: Unsteadiness on  feet (R26.81);Muscle weakness (generalized) (M62.81)   Activity Tolerance     Patient Left in chair;with call bell/phone within reach   Nurse Communication Mobility status        Time: 7035-0093 OT Time Calculation (min): 20 min  Charges: OT General Charges $OT Visit: 1 Visit OT Treatments $Therapeutic Activity: 8-22 mins     Leota Sauers, OTR/L  03/02/2022, 12:25 PM

## 2022-03-03 DIAGNOSIS — J121 Respiratory syncytial virus pneumonia: Secondary | ICD-10-CM | POA: Diagnosis not present

## 2022-03-03 LAB — BASIC METABOLIC PANEL
Anion gap: 6 (ref 5–15)
BUN: 42 mg/dL — ABNORMAL HIGH (ref 8–23)
CO2: 27 mmol/L (ref 22–32)
Calcium: 8.2 mg/dL — ABNORMAL LOW (ref 8.9–10.3)
Chloride: 102 mmol/L (ref 98–111)
Creatinine, Ser: 1.25 mg/dL — ABNORMAL HIGH (ref 0.44–1.00)
GFR, Estimated: 44 mL/min — ABNORMAL LOW (ref >60)
Glucose, Bld: 114 mg/dL — ABNORMAL HIGH (ref 70–99)
Potassium: 4 mmol/L (ref 3.5–5.1)
Sodium: 135 mmol/L (ref 135–145)

## 2022-03-03 LAB — GLUCOSE, CAPILLARY
Glucose-Capillary: 124 mg/dL — ABNORMAL HIGH (ref 70–99)
Glucose-Capillary: 100 mg/dL — ABNORMAL HIGH (ref 70–99)
Glucose-Capillary: 174 mg/dL — ABNORMAL HIGH (ref 70–99)
Glucose-Capillary: 147 mg/dL — ABNORMAL HIGH (ref 70–99)

## 2022-03-03 NOTE — Progress Notes (Signed)
Physical Therapy Treatment Patient Details Name: [name] MRN: [id number] DOB: 06/27/1945 Today's Date: 03/03/2022   History of Present Illness Patient is a 77 year old female admitted with RSV. Hx of neuropathy, respiratory failure, CHF, COPD, cardiomyopathy, DM, CKD    PT Comments    Pt continues very pleasant and cooperative and progressing steadily with mobility.  Pt up to ambulate in halls with continued mild gait instability but maintaining O2 sats in high 90's on RA and with no noted SOB.  Pt states hopeful for dc home tomorrow.  Recommendations for follow up therapy are one component of a multi-disciplinary discharge planning process, led by the attending physician.  Recommendations may be updated based on patient status, additional functional criteria and insurance authorization.  Follow Up Recommendations  Home health PT     Assistance Recommended at Discharge PRN  Patient can return home with the following     Equipment Recommendations  None recommended by PT    Recommendations for Other Services OT consult     Precautions / Restrictions Precautions Precautions: Fall Precaution Comments: monitor sats Restrictions Weight Bearing Restrictions: No     Mobility  Bed Mobility Overal bed mobility: Modified Independent Bed Mobility: Supine to Sit     Supine to sit: Modified independent (Device/Increase time)          Transfers Overall transfer level: Modified independent   Transfers: Sit to/from Stand Sit to Stand: Modified independent (Device/Increase time)                Ambulation/Gait Ambulation/Gait assistance: Min guard, Supervision Gait Distance (Feet): 350 Feet (and 15' into bathroom) Assistive device: None Gait Pattern/deviations: Step-through pattern Gait velocity: decreased     General Gait Details: occasionally mild instability but no overt LOB   Stairs             Wheelchair Mobility    Modified Rankin (Stroke  Patients Only)       Balance Overall balance assessment: Needs assistance Sitting-balance support: No upper extremity supported, Feet supported Sitting balance-Leahy Scale: Good     Standing balance support: During functional activity Standing balance-Leahy Scale: Fair                              Cognition Arousal/Alertness: Awake/alert Behavior During Therapy: WFL for tasks assessed/performed Overall Cognitive Status: Within Functional Limits for tasks assessed                                          Exercises      General Comments        Pertinent Vitals/Pain Pain Assessment Pain Assessment: No/denies pain    Home Living                          Prior Function            PT Goals (current goals can now be found in the care plan section) Acute Rehab PT Goals Patient Stated Goal: regain PLOF/independence PT Goal Formulation: With patient Time For Goal Achievement: 03/13/22 Potential to Achieve Goals: Good Progress towards PT goals: Progressing toward goals    Frequency    Min 3X/week      PT Plan Current plan remains appropriate    Co-evaluation  AM-PAC PT "6 Clicks" Mobility   Outcome Measure  Help needed turning from your back to your side while in a flat bed without using bedrails?: None Help needed moving from lying on your back to sitting on the side of a flat bed without using bedrails?: None Help needed moving to and from a bed to a chair (including a wheelchair)?: A Little Help needed standing up from a chair using your arms (e.g., wheelchair or bedside chair)?: A Little Help needed to walk in hospital room?: A Little Help needed climbing 3-5 steps with a railing? : A Little 6 Click Score: 20    End of Session Equipment Utilized During Treatment: Gait belt Activity Tolerance: Patient tolerated treatment well Patient left: in chair;with call bell/phone within reach;with  nursing/sitter in room Nurse Communication: Mobility status PT Visit Diagnosis: Unsteadiness on feet (R26.81);Difficulty in walking, not elsewhere classified (R26.2)     Time: 3557-3220 PT Time Calculation (min) (ACUTE ONLY): 21 min  Charges:  $Gait Training: 8-22 mins                     [place] Pager [phone] Office [phone]    [facility] 03/03/2022, 12:49 PM

## 2022-03-03 NOTE — Progress Notes (Signed)
PROGRESS NOTE    [name]  ZSW:[id number]  DOB: 06-30-1945  DOA: 02/26/2022 PCP: Lujean Amel, MD Outpatient Specialists:   Hospital course: 77 year old female with nonischemic cardiomyopathy, HTN, DM 2, CKD stage IIIb was admitted with CAP complicated by RSV.  05/11/252: Patient seen.  Continues to improve.  Likely discharge tomorrow.   Subjective: -No new complaints.  Objective: Vitals:   03/02/22 1827 03/02/22 2227 03/03/22 0452 03/03/22 1203  BP: (!) 144/66 (!) 133/55 (!) 149/70 130/68  Pulse: (!) 58 61 (!) 56 (!) 57  Resp:  '18 16 18  '$ Temp:  98.4 F (36.9 C) 98.1 F (36.7 C) 97.9 F (36.6 C)  TempSrc:  Oral Oral Oral  SpO2:  100% 100% 100%  Weight:      Height:        Intake/Output Summary (Last 24 hours) at 03/03/2022 1708 Last data filed at 03/03/2022 0445 Gross per 24 hour  Intake 360 ml  Output 600 ml  Net -240 ml    Filed Weights   02/26/22 1742 02/27/22 0400  Weight: 52.2 kg 53.4 kg     Exam:  General: Not in any distress.  Patient is awake and alert.   Eyes: Patient is pale.  No jaundice.   CVS: S1-S2, regular  Respiratory: Clear to auscultation.Marland Kitchen GI: Soft and nontender.   Extremities: No leg edema. Neuro: Patient is awake and alert.  Patient moves all extremities..  Data Reviewed:  Basic Metabolic Panel: Recent Labs  Lab 02/27/22 0401 02/28/22 0810 02/28/22 0815 03/01/22 0810 03/02/22 0430 03/03/22 0411  NA 133* 135  --  134* 134* 135  K 4.5 4.0  --  3.8 3.9 4.0  CL 96* 97*  --  94* 100 102  CO2 28 29  --  '27 27 27  '$ GLUCOSE 184* 161*  --  204* 130* 114*  BUN 55* 71*  --  65* 64* 42*  CREATININE 2.25* 2.07*  --  1.80* 1.45* 1.25*  CALCIUM 8.6* 8.8*  --  8.8* 8.3* 8.2*  MG  --   --  2.2  --   --   --      CBC: Recent Labs  Lab 02/26/22 1759 02/27/22 0401 02/28/22 0810  WBC 7.4 8.3 8.3  NEUTROABS 5.9  --  6.7  HGB 9.3* 9.3* 9.3*  HCT 27.5* 27.6* 28.3*  MCV 92.6 94.8 93.1  PLT 150 157 197      Scheduled  Meds:  carvedilol  3.125 mg Oral BID WC   enoxaparin (LOVENOX) injection  30 mg Subcutaneous Q24H   feeding supplement (GLUCERNA SHAKE)  237 mL Oral BID BM   insulin aspart  0-15 Units Subcutaneous TID WC   insulin aspart  0-5 Units Subcutaneous QHS   insulin aspart  4 Units Subcutaneous TID WC   insulin NPH Human  10 Units Subcutaneous BID AC & HS   mupirocin ointment   Nasal BID   sacubitril-valsartan  1 tablet Oral BID   Continuous Infusions:     Assessment & Plan:   CAP complicated by RSV Patient seems to be improving clinically with less cough Continue ceftriaxone and azithromycin Rare wheeze, treat with inhaled bronchodilators  Nonischemic cardiomyopathy 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 Patient is being actively followed by cardiology who think reduced EF is likely secondary to RSV They will see her in follow-up as an outpatient  Acute on chronic kidney disease Improving with treatment of infection and decompensated heart failure Recent Labs  Lab 02/03/22 1625 02/26/22 1759 02/27/22 0401 02/28/22 0810 03/01/22 0810 03/02/22 0430 03/03/22 0411  CREATININE 1.73* 2.22* 2.25* 2.07* 1.80* 1.45* 1.25*    DM 2 -Optimize blood sugar. -Hemoglobin A1c was 13 -Diabetes educator influenza B highly appreciated.  DVT prophylaxis: Lovenox Code Status: Full Family Communication: Patient states she is in communication with her family    Studies: No results found.  Principal Problem:   RSV (respiratory syncytial virus pneumonia) Active Problems:   Type 2 diabetes mellitus without  complication, with long-term current use of insulin (HCC)   Essential hypertension   Chronic diastolic heart failure (HCC)   CKD (chronic kidney disease), stage III (HCC)   AKI (acute kidney injury) ([place])   Transaminitis   Respiratory failure with hypoxia and hypercapnia ([place])   Malnutrition of moderate degree     [facility], [facility]  If 7PM-7AM, please contact night-coverage [email or web address]   LOS: 4 days

## 2022-03-03 NOTE — Inpatient Diabetes Management (Signed)
Inpatient Diabetes Program Recommendations  AACE/ADA: New Consensus Statement on Inpatient Glycemic Control (2015)  Target Ranges:  Prepandial:   less than 140 mg/dL      Peak postprandial:   less than 180 mg/dL (1-2 hours)      Critically ill patients:  140 - 180 mg/dL   Lab Results  Component Value Date   GLUCAP 174 (H) 03/03/2022   HGBA1C 13.3 (H) 02/27/2022    Review of Glycemic Control  Diabetes history: DM2 Outpatient Diabetes medications: 70/30 22 at breakfast and 12 with supper.(Does not always take the 12 at supper) Current orders for Inpatient glycemic control: Novolin N 10 BID, Novolog 0-15 TID with meals and 0-5 HS + 4 units TID  HgbA1C 13.3%  Inpatient Diabetes Program Recommendations:    Agree with orders.  Spoke with pt at bedside regarding her diabetes control and HgbA1C of 13.3%, and home regimen. Pt takes am insulin and most of the time she does NOT take the pm dose before supper. Not checking blood sugars on consistent basis. Discussed A1C results (13.3% on 02/27/22 ) and explained that current A1C indicates an average glucose of 335 mg/dl over the past 2-3 months. Discussed glucose and A1C goals. Discussed importance of checking CBGs and maintaining good CBG control to prevent long-term and short-term complications. Explained how hyperglycemia leads to damage within blood vessels which lead to the common complications seen with uncontrolled diabetes. Stressed to the patient the importance of improving glycemic control to prevent further complications from uncontrolled diabetes. Discussed impact of nutrition, exercise, stress, sickness, and medications on diabetes control. Pt reports no other questions. States, "my kids don't know my blood sugars are this high."  Continue to follow.   Thank you. Lorenda Peck, RD, LDN, [place] Inpatient Diabetes Coordinator [phone]

## 2022-03-04 DIAGNOSIS — R4182 Altered mental status, unspecified: Secondary | ICD-10-CM

## 2022-03-04 DIAGNOSIS — J121 Respiratory syncytial virus pneumonia: Secondary | ICD-10-CM | POA: Diagnosis not present

## 2022-03-04 DIAGNOSIS — N179 Acute kidney failure, unspecified: Secondary | ICD-10-CM | POA: Diagnosis not present

## 2022-03-04 DIAGNOSIS — R69 Illness, unspecified: Secondary | ICD-10-CM | POA: Diagnosis not present

## 2022-03-04 DIAGNOSIS — J210 Acute bronchiolitis due to respiratory syncytial virus: Secondary | ICD-10-CM

## 2022-03-04 LAB — GLUCOSE, CAPILLARY
Glucose-Capillary: 69 mg/dL — ABNORMAL LOW (ref 70–99)
Glucose-Capillary: 98 mg/dL (ref 70–99)
Glucose-Capillary: 221 mg/dL — ABNORMAL HIGH (ref 70–99)

## 2022-03-04 LAB — CULTURE, BLOOD (ROUTINE X 2)
Culture: NO GROWTH
Culture: NO GROWTH
Special Requests: ADEQUATE

## 2022-03-04 LAB — BASIC METABOLIC PANEL
Anion gap: 7 (ref 5–15)
BUN: 32 mg/dL — ABNORMAL HIGH (ref 8–23)
CO2: 26 mmol/L (ref 22–32)
Calcium: 8.3 mg/dL — ABNORMAL LOW (ref 8.9–10.3)
Chloride: 103 mmol/L (ref 98–111)
Creatinine, Ser: 1.16 mg/dL — ABNORMAL HIGH (ref 0.44–1.00)
GFR, Estimated: 48 mL/min — ABNORMAL LOW (ref >60)
Glucose, Bld: 69 mg/dL — ABNORMAL LOW (ref 70–99)
Potassium: 4 mmol/L (ref 3.5–5.1)
Sodium: 136 mmol/L (ref 135–145)

## 2022-03-04 LAB — CBC
HCT: 30.5 % — ABNORMAL LOW (ref 36.0–46.0)
Hemoglobin: 9.9 g/dL — ABNORMAL LOW (ref 12.0–15.0)
MCH: 30.9 pg (ref 26.0–34.0)
MCHC: 32.5 g/dL (ref 30.0–36.0)
MCV: 95.3 fL (ref 80.0–100.0)
Platelets: 308 K/uL (ref 150–400)
RBC: 3.2 MIL/uL — ABNORMAL LOW (ref 3.87–5.11)
RDW: 14.5 % (ref 11.5–15.5)
WBC: 5.8 K/uL (ref 4.0–10.5)
nRBC: 0 % (ref 0.0–0.2)

## 2022-03-04 MED ORDER — ENTRESTO 49-51 MG PO TABS: 1.0000 | ORAL_TABLET | Freq: Two times a day (BID) | ORAL | 3 refills | Status: DC

## 2022-03-04 MED ORDER — BENZONATATE 100 MG PO CAPS
100.0000 mg | ORAL_CAPSULE | Freq: Three times a day (TID) | ORAL | Status: DC
  Administered 2022-03-04: 100 mg via ORAL
  Filled 2022-03-04: qty 1

## 2022-03-04 MED ORDER — ENOXAPARIN SODIUM 40 MG/0.4ML IJ SOSY: 40.0000 mg | PREFILLED_SYRINGE | INTRAMUSCULAR | Status: DC

## 2022-03-04 MED ORDER — MUPIROCIN 2 % EX OINT: TOPICAL_OINTMENT | Freq: Two times a day (BID) | CUTANEOUS | 0 refills | Status: AC

## 2022-03-04 MED ORDER — CARVEDILOL 3.125 MG PO TABS: 3.1250 mg | ORAL_TABLET | Freq: Two times a day (BID) | ORAL | 3 refills | Status: DC

## 2022-03-04 MED ORDER — GUAIFENESIN-DM 100-10 MG/5ML PO SYRP: 5.0000 mL | ORAL_SOLUTION | ORAL | 0 refills | Status: DC | PRN

## 2022-03-04 MED ORDER — BENZONATATE 100 MG PO CAPS: 100.0000 mg | ORAL_CAPSULE | Freq: Three times a day (TID) | ORAL | 0 refills | Status: DC | PRN

## 2022-03-04 MED ORDER — FUROSEMIDE 40 MG PO TABS: 40.0000 mg | ORAL_TABLET | Freq: Every day | ORAL | 3 refills | Status: DC

## 2022-03-04 NOTE — Discharge Summary (Signed)
Physician Discharge Summary   Patient: [name] MRN: [id number] DOB: 1945-06-08  Admit date:     02/26/2022  Discharge date: 03/04/22  Discharge Physician: Estill Cotta, MD    PCP: Lujean Amel, MD   Recommendations at discharge:   Continue Lasix 40 mg daily Plan to consider SGLT2 and spironolactone as an outpatient Needs bmet labs checked at follow-up appointment Entresto resumed 49-51, 1 tab p.o. twice daily Coreg reduced to 3.125 mg twice daily Outpatient follow-up with cardiology scheduled on 04/07/2022  Discharge Diagnoses:    RSV (respiratory syncytial virus pneumonia)  Community-acquired pneumonia superimposed on RSV  Acute on chronic combined CHF ([place])  Nonischemic cardiomyopathy   AKI (acute kidney injury) ([place]) on CKD, stage IIIb   Transaminitis  Acute respiratory failure with hypoxia   Type 2 diabetes mellitus without complication, with long-term current use of insulin ([place])   Essential hypertension     Malnutrition of moderate degree with debility    Hospital Course:  77 year old with nonocclusive CAD, nonischemic cardiomyopathy with recent ejection fraction 55%, hypertension, type 2 diabetes and CKD stage IIIb with baseline creatinine about 1.5 presented with about 4 days of cough congestion, drowsy and sleeping more. Poor oral intake. At urgent care, 86% on room air. Sent to ER. Found to have AKI, positive for RSV, procalcitonin more than 5 . BNP > 4500, patient was admitted for further workup.   Assessment and Plan:   RSV positive complicated with community-acquired pneumonia acute respiratory failure with hypoxia, sepsis, POA -Met sepsis criteria on admission.  Presented with AKI, RSV positive, O2 sats 86% on room air, borderline hypotension on admission, procalcitonin 5.43 lactic acid 1.4 -Patient was placed on IV Zithromax, Rocephin, has completed course of IV antibiotics for 5 days -Placed on chest physiotherapy, I-S, pulmonary hygiene,  bronchodilators -Sputum cultures, blood cultures, urine Legionella antigen, urine strep antigen negative -Currently improving, O2 sats 100% on room air  Acute kidney injury with a history of CKD stage IIIb -Baseline creatinine 1.5-2.0, presented with creatinine of 2.2 on admission -Initially placed on IV fluid hydration due to hypotension and sepsis -Subsequently placed on intermittent doses of IV Lasix for acute on chronic combined CHF, -creatinine 1.1 at discharge.   Acute on chronic combined CHF,  history of nonischemic cardiomyopathy, nonobstructive CAD -BNP> 4500, 2D echo showed EF of 35 to 40% with G3 DD -Cardiology was consulted, patient started back on Entresto, received IV Lasix -Renal function has improved, cardiology recommended oral Lasix 40 mg daily, continue Coreg 3.125 mg twice daily, and Entresto.  Consider SGLT2 and spironolactone as an outpatient after she has recovered from RSV and creatinine more stable.  Will need repeat TTE after few months. -Home Coreg reduced to 3.125 mg twice daily due to bradycardia -She has follow-up scheduled with cardiology on 04/07/2022.   Hypertension -Currently stable, continue BP meds including Lasix, Entresto, Coreg  Hyperlipidemia -Continue Lipitor   Type 2 diabetes, IDDM, uncontrolled with hyperglycemia, underlying CKD stage IIIb -Hemoglobin A1c 13.3% -Diabetic coordinator was consulted, patient has not been taking the p.m. dose of insulin.  Patient was counseled strongly being compliant with a home regimen of 70/30 insulin twice daily  Generalized debility -PT OT recommended home health PT however patient had declined       Pain control - [place] Controlled Substance Reporting System database was reviewed. and patient was instructed, not to drive, operate heavy machinery, perform activities at heights, swimming or participation in water activities or provide baby-sitting services while  on Pain, Sleep and Anxiety Medications;  until their outpatient Physician has advised to do so again. Also recommended to not to take more than prescribed Pain, Sleep and Anxiety Medications.  Consultants: Cardiology Procedures performed: 2D echo, Disposition: Home Diet recommendation:  Discharge Diet Orders (From admission, onward)     Start     Ordered   03/04/22 0000  Diet Carb Modified        03/04/22 0901           Carb modified diet DISCHARGE MEDICATION: Allergies as of 03/04/2022       Reactions   Tape Rash   Pt can tolerate the paper tape        Medication List     TAKE these medications    atorvastatin 40 MG tablet Commonly known as: LIPITOR Take 1 tablet (40 mg total) by mouth daily.   benzonatate 100 MG capsule Commonly known as: TESSALON Take 1 capsule (100 mg total) by mouth 3 (three) times daily as needed for cough.   carvedilol 3.125 MG tablet Commonly known as: COREG Take 1 tablet (3.125 mg total) by mouth 2 (two) times daily with a meal. What changed:  medication strength how much to take when to take this   Entresto 49-51 MG Generic drug: sacubitril-valsartan Take 1 tablet by mouth 2 (two) times daily.   furosemide 40 MG tablet Commonly known as: LASIX Take 1 tablet (40 mg total) by mouth daily. What changed:  how much to take how to take this when to take this additional instructions   guaiFENesin-dextromethorphan 100-10 MG/5ML syrup Commonly known as: ROBITUSSIN DM Take 5 mLs by mouth every 4 (four) hours as needed for cough (chest congestion).   insulin NPH-regular Human (70-30) 100 UNIT/ML injection 22 units with breakfast, and 12 units with supper. What changed:  how much to take how to take this when to take this reasons to take this additional instructions   mupirocin ointment 2 % Commonly known as: BACTROBAN Place into the nose 2 (two) times daily for 7 days.        Follow-up Information     Lendon Colonel, NP Follow up on 04/07/2022.    Specialties: Cardiology, Radiology, Cardiology Why: at 3:35 pm  please arrive 15 min early to check in.  (This is Dr. Allison Quarry Nurse Practitioner) Contact information: [address] [place] [address] [phone]         Lujean Amel, MD. Schedule an appointment as soon as possible for a visit in 2 week(s).   Specialty: Family Medicine Why: for hospital follow-up Contact information: [address] [place] [address] [phone]                Discharge Exam: Danley Danker Weights   02/26/22 1742 02/27/22 0400  Weight: 52.2 kg 53.4 kg   S: No acute complaints, coughing and shortness of breath has significantly improved, feels she is ready to go home today.  No fevers or chills.  BP (!) 127/54 (BP Location: Right Arm)   Pulse 63   Temp 98.1 F (36.7 C) (Oral)   Resp 19   Ht 5' 3.5" (1.613 m)   Wt 53.4 kg   SpO2 100%   BMI 20.52 kg/m    Physical Exam General: Alert and oriented x 3, NAD Cardiovascular: S1 S2 clear, RRR.  Respiratory: CTAB, no wheezing, rales or rhonchi Gastrointestinal: Soft, nontender, nondistended, NBS Ext: no pedal edema bilaterally Neuro: no new deficits Skin:  No rashes Psych: Normal affect and demeanor   Condition at discharge: fair  The results of significant diagnostics from this hospitalization (including imaging, microbiology, ancillary and laboratory) are listed below for reference.   Imaging Studies: ECHOCARDIOGRAM COMPLETE  Result Date: 02/27/2022    ECHOCARDIOGRAM REPORT   Patient Name:   MADYLYN INSCO Date of Exam: 02/27/2022 Medical Rec #:  [id number]    Height:       63.5 in Accession #:    [id number]   Weight:       117.7 lb Date of Birth:  1945-08-22    BSA:          1.553 m Patient Age:    2 years     BP:           150/84 mmHg Patient Gender: F            HR:           61 bpm. Exam Location:  Inpatient Procedure: 3D Echo, 2D Echo, Color Doppler and Cardiac Doppler Indications:     CHF-Acute Diastolic J24.26  History:        Patient has prior history of Echocardiogram examinations, most                 recent 09/20/2020. CHF and Cardiomyopathy, CAD; Risk                 Factors:Diabetes. Past history of breast cancer.  Sonographer:    Darlina Sicilian RDCS Referring Phys: [place]  1. Left ventricular ejection fraction, by estimation, is 35 to 40%. The left ventricle has moderately decreased function. The left ventricle demonstrates global hypokinesis. The left ventricular internal cavity size was moderately dilated. Left ventricular diastolic parameters are consistent with Grade III diastolic dysfunction (restrictive).  2. Right ventricular systolic function is low normal. The right ventricular size is moderately enlarged. There is mildly elevated pulmonary artery systolic pressure. The estimated right ventricular systolic pressure is 83.4 mmHg.  3. Left atrial size was moderately dilated.  4. Right atrial size was moderately dilated.  5. The mitral valve is degenerative. Mild mitral valve regurgitation. No evidence of mitral stenosis.  6. The aortic valve is tricuspid. Aortic valve regurgitation is not visualized. No aortic stenosis is present.  7. The inferior vena cava is normal in size with <50% respiratory variability, suggesting right atrial pressure of 8 mmHg.  8. Evidence of atrial level shunting detected by color flow Doppler. There is a small patent foramen ovale with predominantly left to right shunting across the atrial septum. Comparison(s): The left ventricular function is worsened. Prior EF 50-55%. FINDINGS  Left Ventricle: Left ventricular ejection fraction, by estimation, is 35 to 40%. The left ventricle has moderately decreased function. The left ventricle demonstrates global hypokinesis. 3D left ventricular ejection fraction analysis performed but not reported based on interpreter judgement due to suboptimal tracking. The left ventricular internal cavity  size was moderately dilated. There is no left ventricular hypertrophy. Left ventricular diastolic parameters are consistent with Grade III diastolic dysfunction (restrictive). Right Ventricle: The right ventricular size is moderately enlarged. No increase in right ventricular wall thickness. Right ventricular systolic function is low normal. There is mildly elevated pulmonary artery systolic pressure. The tricuspid regurgitant  velocity is 2.79 m/s, and with an assumed right atrial pressure of 8 mmHg, the estimated right ventricular systolic pressure is 19.6 mmHg. Left Atrium: Left atrial size was moderately dilated. Right Atrium: Right atrial size was moderately  dilated. Pericardium: There is no evidence of pericardial effusion. Mitral Valve: The mitral valve is degenerative in appearance. There is mild thickening of the mitral valve leaflet(s). Mild mitral annular calcification. Mild mitral valve regurgitation. No evidence of mitral valve stenosis. Tricuspid Valve: The tricuspid valve is grossly normal. Tricuspid valve regurgitation is mild . No evidence of tricuspid stenosis. Aortic Valve: The aortic valve is tricuspid. Aortic valve regurgitation is not visualized. No aortic stenosis is present. Pulmonic Valve: The pulmonic valve was grossly normal. Pulmonic valve regurgitation is not visualized. No evidence of pulmonic stenosis. Aorta: The aortic root and ascending aorta are structurally normal, with no evidence of dilitation. Venous: The inferior vena cava is normal in size with less than 50% respiratory variability, suggesting right atrial pressure of 8 mmHg. IAS/Shunts: Evidence of atrial level shunting detected by color flow Doppler. A small patent foramen ovale is detected with predominantly left to right shunting across the atrial septum.  LEFT VENTRICLE PLAX 2D LVIDd:         4.90 cm   Diastology LVIDs:         3.90 cm   LV e' medial:    2.93 cm/s LV PW:         0.90 cm   LV E/e' medial:  34.8 LV IVS:         0.90 cm   LV e' lateral:   3.15 cm/s LVOT diam:     1.80 cm   LV E/e' lateral: 32.4 LV SV:         45 LV SV Index:   29 LVOT Area:     2.54 cm                           3D Volume EF:                          3D EF:        49 %                          LV EDV:       124 ml                          LV ESV:       63 ml                          LV SV:        61 ml RIGHT VENTRICLE RV Basal diam:  4.40 cm RV Mid diam:    3.60 cm RV S prime:     8.89 cm/s TAPSE (M-mode): 1.5 cm LEFT ATRIUM             Index        RIGHT ATRIUM           Index LA diam:        4.40 cm 2.83 cm/m   RA Area:     12.20 cm LA Vol (A2C):   66.9 ml 43.08 ml/m  RA Volume:   27.70 ml  17.84 ml/m LA Vol (A4C):   60.6 ml 39.03 ml/m LA Biplane Vol: 65.1 ml 41.93 ml/m  AORTIC VALVE LVOT Vmax:   81.60 cm/s LVOT Vmean:  56.800 cm/s LVOT VTI:    0.177 m  AORTA Ao Root diam:  2.40 cm Ao Asc diam:  2.50 cm MITRAL VALVE                TRICUSPID VALVE MV Area (PHT): 4.49 cm     TR Peak grad:   31.1 mmHg MV Decel Time: 169 msec     TR Vmax:        279.00 cm/s MV E velocity: 102.00 cm/s MV A velocity: 27.40 cm/s   SHUNTS MV E/A ratio:  3.72         Systemic VTI:  0.18 m                             Systemic Diam: 1.80 cm Candee Furbish MD Electronically signed by Candee Furbish MD Signature Date/Time: 02/27/2022/2:43:01 PM    Final    CT ABDOMEN PELVIS WO CONTRAST  Result Date: 02/27/2022 CLINICAL DATA:  Abdominal pain, acute, nonlocalized. Poor oral intake and decreased activity. EXAM: CT ABDOMEN AND PELVIS WITHOUT CONTRAST TECHNIQUE: Multidetector CT imaging of the abdomen and pelvis was performed following the standard protocol without IV contrast. RADIATION DOSE REDUCTION: This exam was performed according to the departmental dose-optimization program which includes automated exposure control, adjustment of the mA and/or kV according to patient size and/or use of iterative reconstruction technique. COMPARISON:  04/28/2012. FINDINGS: Lower chest: The heart  is enlarged. Mild atelectasis is noted at the lung bases. Hepatobiliary: No focal liver abnormality is seen. Hyperdense material is present within the gallbladder, possible sludge or stones. No biliary ductal dilatation. Pancreas: There is atrophy of the pancreatic tail with coarse calcifications. A cystic structure is noted in the region of the pancreatic tail measuring 1 cm. No surrounding inflammatory changes. Spleen: Normal in size without focal abnormality. Adrenals/Urinary Tract: The adrenal glands are within normal limits. No renal calculus or hydronephrosis. Mild perinephric fat stranding is noted bilaterally. The bladder is unremarkable. Stomach/Bowel: Stomach is within normal limits. Appendix is not seen. No evidence of bowel wall thickening, distention, or inflammatory changes. No free air or pneumatosis. Vascular/Lymphatic: Aortic atherosclerosis. No enlarged abdominal or pelvic lymph nodes. Reproductive: Status post hysterectomy. No adnexal masses. Other: Mesenteric edema with a small amount of free fluid in the pelvis. Musculoskeletal: Mild anasarca. Degenerative changes are present in the thoracolumbar spine. No acute or suspicious osseous abnormality. IMPRESSION: 1. Hyperdense material in the gallbladder, possible sludge or stones. 2. Coarse calcifications in the tail of the pancreas with distal cystic attenuation, question cyst versus pancreatic ductal dilatation. The pancreas is not well evaluated due to lack of IV contrast. Further evaluation with contrast enhanced CT or MRI is suggested. 3. Mesenteric edema, small amount of free fluid in the pelvis, and anasarca. 4. Cardiomegaly. 5. Aortic atherosclerosis. Electronically Signed   By: Brett Fairy M.D.   On: 02/27/2022 00:55   DG Chest Port 1 View  Result Date: 02/26/2022 CLINICAL DATA:  Hypoxia EXAM: PORTABLE CHEST - 1 VIEW COMPARISON:  09/21/2020 FINDINGS: The heart size and mediastinal contours are within normal limits. There is diffuse  pulmonary interstitial prominence which could be seen with atypical infection, edema or interstitial lung disease. Alveolar density left mid lung could represent developing consolidative pneumonia. Aorta is calcified. The visualized skeletal structures are unremarkable. IMPRESSION: Nonspecific interstitial prominence consistent with atypical infection, edema or interstitial lung disease. Left mid lung consolidation may represent early bronchopneumonia. Electronically Signed   By: Sammie Bench M.D.   On: 02/26/2022 19:06    Microbiology: Results for  orders placed or performed during the hospital encounter of 02/26/22  Resp panel by RT-PCR (RSV, Flu A&B, Covid) Anterior Nasal Swab     Status: Abnormal   Collection Time: 02/26/22  5:59 PM   Specimen: Anterior Nasal Swab  Result Value Ref Range Status   SARS Coronavirus 2 by RT PCR NEGATIVE NEGATIVE Final    Comment: (NOTE) SARS-CoV-2 target nucleic acids are NOT DETECTED.  The SARS-CoV-2 RNA is generally detectable in upper respiratory specimens during the acute phase of infection. The lowest concentration of SARS-CoV-2 viral copies this assay can detect is 138 copies/mL. A negative result does not preclude SARS-Cov-2 infection and should not be used as the sole basis for treatment or other patient management decisions. A negative result may occur with  improper specimen collection/handling, submission of specimen other than nasopharyngeal swab, presence of viral mutation(s) within the areas targeted by this assay, and inadequate number of viral copies(<138 copies/mL). A negative result must be combined with clinical observations, patient history, and epidemiological information. The expected result is Negative.  Fact Sheet for Patients:  EntrepreneurPulse.com.au  Fact Sheet for Healthcare Providers:  IncredibleEmployment.be  This test is no t yet approved or cleared by the [place] FDA and   has been authorized for detection and/or diagnosis of SARS-CoV-2 by FDA under an Emergency Use Authorization (EUA). This EUA will remain  in effect (meaning this test can be used) for the duration of the COVID-19 declaration under Section 564(b)(1) of the Act, 21 U.S.C.section 360bbb-3(b)(1), unless the authorization is terminated  or revoked sooner.       Influenza A by PCR NEGATIVE NEGATIVE Final   Influenza B by PCR NEGATIVE NEGATIVE Final    Comment: (NOTE) The Xpert Xpress SARS-CoV-2/FLU/RSV plus assay is intended as an aid in the diagnosis of influenza from Nasopharyngeal swab specimens and should not be used as a sole basis for treatment. Nasal washings and aspirates are unacceptable for Xpert Xpress SARS-CoV-2/FLU/RSV testing.  Fact Sheet for Patients: EntrepreneurPulse.com.au  Fact Sheet for Healthcare Providers: IncredibleEmployment.be  This test is not yet approved or cleared by the [place] FDA and has been authorized for detection and/or diagnosis of SARS-CoV-2 by FDA under an Emergency Use Authorization (EUA). This EUA will remain in effect (meaning this test can be used) for the duration of the COVID-19 declaration under Section 564(b)(1) of the Act, 21 U.S.C. section 360bbb-3(b)(1), unless the authorization is terminated or revoked.     Resp Syncytial Virus by PCR POSITIVE (A) NEGATIVE Final    Comment: (NOTE) Fact Sheet for Patients: EntrepreneurPulse.com.au  Fact Sheet for Healthcare Providers: IncredibleEmployment.be  This test is not yet approved or cleared by the [place] FDA and has been authorized for detection and/or diagnosis of SARS-CoV-2 by FDA under an Emergency Use Authorization (EUA). This EUA will remain in effect (meaning this test can be used) for the duration of the COVID-19 declaration under Section 564(b)(1) of the Act, 21 U.S.C. section 360bbb-3(b)(1),  unless the authorization is terminated or revoked.  Performed at [facility], [address], [place], [place] [address]   Culture, blood (routine x 2)     Status: None   Collection Time: 02/26/22 10:00 PM   Specimen: BLOOD  Result Value Ref Range Status   Specimen Description   Final    BLOOD BLOOD RIGHT ARM Performed at [facility], [address] [place], [place] [address]    Special Requests   Final    BOTTLES DRAWN AEROBIC  AND ANAEROBIC Blood Culture adequate volume Performed at [facility], [address] [place], [place] [address]    Culture   Final    NO GROWTH 5 DAYS Performed at [facility], [place] [address], [place], [place] [address]    Report Status 03/04/2022 FINAL  Final  Culture, blood (routine x 2)     Status: None   Collection Time: 02/26/22 10:04 PM   Specimen: BLOOD  Result Value Ref Range Status   Specimen Description   Final    BLOOD LEFT ANTECUBITAL Performed at [facility], [address] [place], [place] [address]    Special Requests   Final    BOTTLES DRAWN AEROBIC AND ANAEROBIC Blood Culture results may not be optimal due to an excessive volume of blood received in culture bottles Performed at [facility], [address], [place], [place] [address]    Culture   Final    NO GROWTH 5 DAYS Performed at [facility], [place] [address], [place], [place] [address]    Report Status 03/04/2022 FINAL  Final  MRSA Next Gen by PCR, Nasal     Status: Abnormal   Collection Time: 02/27/22  1:46 AM   Specimen: Nasal Mucosa; Nasal Swab  Result Value Ref Range Status   MRSA by PCR Next Gen DETECTED (A) NOT DETECTED Final    Comment: CRITICAL RESULT CALLED TO, READ BACK BY AND VERIFIED WITH: WILLIS,S AT 3474 ON 02/27/22 BY LUZOLOP (NOTE) The GeneXpert MRSA Assay (FDA approved for NASAL specimens only), is one component of a comprehensive MRSA  colonization surveillance program. It is not intended to diagnose MRSA infection nor to guide or monitor treatment for MRSA infections. Test performance is not FDA approved in patients less than 65 years old. Performed at [facility], [place] [address], [place], [place] [address]   Expectorated Sputum Assessment w Gram Stain, Rflx to Resp Cult     Status: None   Collection Time: 02/27/22 12:41 PM   Specimen: Sputum  Result Value Ref Range Status   Specimen Description SPUTUM  Final   Special Requests Normal  Final   Sputum evaluation   Final    THIS SPECIMEN IS ACCEPTABLE FOR SPUTUM CULTURE Performed at [facility], [place] [address] [place], [place] [address]    Report Status 02/27/2022 FINAL  Final  Culture, Respiratory w Gram Stain     Status: None   Collection Time: 02/27/22 12:41 PM   Specimen: SPU  Result Value Ref Range Status   Specimen Description   Final    SPUTUM Performed at [facility] [address], [place], [place] [address]    Special Requests   Final    Normal Reflexed from [id number] Performed at [facility], [place] [address] [place], [place] [address]    Gram Stain   Final    NO WBC SEEN FEW GRAM POSITIVE COCCI IN PAIRS RARE GRAM POSITIVE RODS    Culture   Final    FEW STREPTOCOCCUS AGALACTIAE TESTING AGAINST S. AGALACTIAE NOT ROUTINELY PERFORMED DUE TO PREDICTABILITY OF AMP/PEN/VAN SUSCEPTIBILITY. No Pseudomonas species isolated NO STAPHYLOCOCCUS AUREUS ISOLATED Performed at [facility] [address] [place], [place] [address]    Report Status 03/02/2022 FINAL  Final    Labs: CBC: Recent Labs  Lab 02/26/22 1759 02/27/22 0401 02/28/22 0810  WBC 7.4 8.3 8.3  NEUTROABS 5.9  --  6.7  HGB  9.3* 9.3* 9.3*  HCT 27.5* 27.6* 28.3*  MCV 92.6 94.8 93.1  PLT 150 157 754   Basic Metabolic Panel: Recent Labs  Lab 02/28/22 0810 02/28/22 0815 03/01/22 0810  03/02/22 0430 03/03/22 0411 03/04/22 0541  NA 135  --  134* 134* 135 136  K 4.0  --  3.8 3.9 4.0 4.0  CL 97*  --  94* 100 102 103  CO2 29  --  '27 27 27 26  '$ GLUCOSE 161*  --  204* 130* 114* 69*  BUN 71*  --  65* 64* 42* 32*  CREATININE 2.07*  --  1.80* 1.45* 1.25* 1.16*  CALCIUM 8.8*  --  8.8* 8.3* 8.2* 8.3*  MG  --  2.2  --   --   --   --    Liver Function Tests: Recent Labs  Lab 02/26/22 1759 02/27/22 0401  AST 164* 147*  ALT 164* 155*  ALKPHOS 100 85  BILITOT 1.2 0.8  PROT 6.5 5.7*  ALBUMIN 3.7 2.7*   CBG: Recent Labs  Lab 03/03/22 1200 03/03/22 1636 03/03/22 2024 03/04/22 0734 03/04/22 0815  GLUCAP 100* 174* 147* 69* 98    Discharge time spent: greater than 30 minutes.  Signed: Estill Cotta, MD [facility] 03/04/2022

## 2022-03-06 ENCOUNTER — Other Ambulatory Visit: Payer: Self-pay

## 2022-03-06 MED ORDER — ENTRESTO 49-51 MG PO TABS: 1.0000 | ORAL_TABLET | Freq: Two times a day (BID) | ORAL | 3 refills | Status: DC

## 2022-03-31 ENCOUNTER — Telehealth: Payer: Self-pay | Admitting: Cardiology

## 2022-03-31 NOTE — Telephone Encounter (Signed)
Patient calling the office for samples of medication:   1.  What medication and dosage are you requesting samples for? Entresto   2.  Are you currently out of this medication?   No patient has 7 tablets remaining, but [employer] informed her that they will not have Entresto available until the end of February.

## 2022-03-31 NOTE — Telephone Encounter (Signed)
Phone kept ringing. No answer.

## 2022-04-01 ENCOUNTER — Encounter: Payer: Self-pay | Admitting: Cardiology

## 2022-04-03 ENCOUNTER — Other Ambulatory Visit: Payer: Self-pay | Admitting: Family Medicine

## 2022-04-03 DIAGNOSIS — K869 Disease of pancreas, unspecified: Secondary | ICD-10-CM

## 2022-04-03 DIAGNOSIS — K8689 Other specified diseases of pancreas: Secondary | ICD-10-CM

## 2022-04-03 NOTE — Telephone Encounter (Signed)
See mychart phone call. Left medication at the front for pt to pick up.

## 2022-04-05 NOTE — Progress Notes (Deleted)
Cardiology Clinic Note   Patient Name: [name] Date of Encounter: 04/05/2022  Primary Care Provider:  Lujean Amel, MD Primary Cardiologist:  Glenetta Hew, MD  Patient Profile   77 y.o. female with a history of non-obstructive CAD noted on cardiac catheterization in 11/2015, non-ischemic cardiomyopathy/ chronic combined CHF with EF as low as 20% in the past but improved to 50-55% on last Echo in 09/2020, palpitations with brief runs of SVT and frequent PVCs/PAC on monitor in 05/2016, remote DVT, hypertension, hyperlipidemia, type 2 diabetes mellitus on insulin, CKD stage IIIb, remote breast cancer s/p lumpectomy and radiation as well as chemotherapy (Tamoxifen), and tobacco abuse.  Recent hospitalization 03/11/2022 for shortness of breath with diagnosis RSV,and non-ischemic CM, EF of 35%-40% with global hypokinesis and Grade III diastolic dysfunction. Treated for acute on chronic mixed CHF, she was to continue Entresto 49/51 mg BID, coreg was decreased to 3.125 mg BID due to bradycardia, she was not placed on SGLT-2 inhibitor due to decreased renal function.    Past Medical History    Past Medical History:  Diagnosis Date   Breast cancer ([place])    breast - left    Chronic diastolic CHF (congestive heart failure), NYHA class 2 ([place])    a. cMRI 5/10: EF 38% // b. Echo 3/13/: EF 50-55, Gr 1 DD // c. Echo 8/14: EF 40, inf/inf-septal HK, Gr 1 DD, mild MR // d. Echo 5/17: EF 40-45, inf HK, Gr 1 DD, mild MR, mild LAE, reduced RVSF, mild TR, PASP 33 // e. Echo 10/17: EF 40-45, Gr 2 DD, mild MR, severe LAE; => 09/2020 EF 50-55%, GrII DD.   Coronary artery disease, non-occlusive    a. nonobs by LHC in 2010 // b. Myoview 10/17: Lg infarct apex, distal ant and lat walls, no ischemia, EF 38; int risk  // c. [place] 10/17:  dLAD 15, pLCx 67, mRCA 40, LVEDP 15   Diabetes mellitus    NICM (nonischemic cardiomyopathy) ([place])    Past Surgical History:  Procedure Laterality Date   APPENDECTOMY     BREAST  LUMPECTOMY     lt breast   CARDIAC CATHETERIZATION N/A 12/20/2015   Procedure: Left Heart Cath and Coronary Angiography;  Surgeon: Nelva Bush, MD:: Mild to moderate, nonobstructive coronary artery disease, including 15% distal LAD stenosis, 30% ulcerated mid LCx disease, and 40% mid RCA stenosis.  Upper normal left ventricular filling pressure (LVEDP 15 mmHg).   RIGHT HEART CATH N/A 05/08/2016   Procedure: Right Heart Cath;  Surgeon: Jolaine Artist, MD;  Location: [facility];; RA = 3, RV = 34/5, PA = 38/12 (21), PCW = 12; Fick cardiac output/index = 4.6/2.6; PVR = 1.9 WU, Ao sat = 96%; PA sat = 58%, 62%    TRANSTHORACIC ECHOCARDIOGRAM  11/2015   a) 11/2015: EF 40 and 45%.  GRII DD; b) 04/2016: EF 20% with grade 2 diastolic dysfunction   TRANSTHORACIC ECHOCARDIOGRAM  10/2016   a) EF 55-60%.  Normal LV function.  Mild DD.Marland Kitchen  Normal valve Fxn.; b) 09/2020: EF 50 to 55%.  Normal function.  No R WMA.  Mild LVH.  GR 2 DD with elevated LAP.-Mild LA dilation.  Moderately elevated PAP estimated RVSP 47 mmHg, and RAP 8 mmHg.   TRIGGER FINGER RELEASE Left 04/23/2017   Procedure: RELEASE TRIGGER FINGER TO LEFT FORTH FINGER;  Surgeon: Cristine Polio, MD;  Location: [facility];  Service: Plastics;  Laterality: Left;    Allergies  Allergies  Allergen Reactions   Tape Rash    Pt can tolerate the paper tape    History of Present Illness    ***  Home Medications    Current Outpatient Medications  Medication Sig Dispense Refill   atorvastatin (LIPITOR) 40 MG tablet Take 1 tablet (40 mg total) by mouth daily. 90 tablet 3   benzonatate (TESSALON) 100 MG capsule Take 1 capsule (100 mg total) by mouth 3 (three) times daily as needed for cough. 30 capsule 0   carvedilol (COREG) 3.125 MG tablet Take 1 tablet (3.125 mg total) by mouth 2 (two) times daily with a meal. 60 tablet 3   furosemide (LASIX) 40 MG tablet Take 1 tablet (40 mg total) by mouth daily. 30 tablet 3    guaiFENesin-dextromethorphan (ROBITUSSIN DM) 100-10 MG/5ML syrup Take 5 mLs by mouth every 4 (four) hours as needed for cough (chest congestion). 118 mL 0   insulin NPH-regular Human (70-30) 100 UNIT/ML injection 22 units with breakfast, and 12 units with supper. (Patient taking differently: Inject 25 Units into the skin as needed (for high BS).) 10 mL    sacubitril-valsartan (ENTRESTO) 49-51 MG Take 1 tablet by mouth 2 (two) times daily. 60 tablet 3   No current facility-administered medications for this visit.     Family History    Family History  Problem Relation Age of Onset   Stroke Father    Diabetes Maternal Grandmother    She indicated that her mother is alive. She indicated that her father is deceased. She indicated that the status of her maternal grandmother is unknown.  Social History    Social History   Socioeconomic History   Marital status: Widowed    Spouse name: Not on file   Number of children: Not on file   Years of education: Not on file   Highest education level: Not on file  Occupational History   Not on file  Tobacco Use   Smoking status: Former    Years: 30.00    Types: Cigarettes    Quit date: 09/29/2012    Years since quitting: 9.5   Smokeless tobacco: Never  Substance and Sexual Activity   Alcohol use: No   Drug use: No   Sexual activity: Not on file  Other Topics Concern   Not on file  Social History Narrative   Not on file   Social Determinants of Health   Financial Resource Strain: Not on file  Food Insecurity: No Food Insecurity (02/27/2022)   Hunger Vital Sign    Worried About Running Out of Food in the Last Year: Never true    Ran Out of Food in the Last Year: Never true  Transportation Needs: No Transportation Needs (02/27/2022)   PRAPARE - Hydrologist (Medical): No    Lack of Transportation (Non-Medical): No  Physical Activity: Not on file  Stress: Not on file  Social Connections: Not on file  Intimate  Partner Violence: Not At Risk (02/27/2022)   Humiliation, Afraid, Rape, and Kick questionnaire    Fear of Current or Ex-Partner: No    Emotionally Abused: No    Physically Abused: No    Sexually Abused: No     Review of Systems    General:  No chills, fever, night sweats or weight changes.  Cardiovascular:  No chest pain, dyspnea on exertion, edema, orthopnea, palpitations, paroxysmal nocturnal dyspnea. Dermatological: No rash, lesions/masses Respiratory: No cough, dyspnea Urologic: No hematuria, dysuria Abdominal:   No  nausea, vomiting, diarrhea, bright red blood per rectum, melena, or hematemesis Neurologic:  No visual changes, wkns, changes in mental status. All other systems reviewed and are otherwise negative except as noted above.     Physical Exam    VS:  There were no vitals taken for this visit. , BMI There is no height or weight on file to calculate BMI.     GEN: Well nourished, well developed, in no acute distress. HEENT: normal. Neck: Supple, no JVD, carotid bruits, or masses. Cardiac: RRR, no murmurs, rubs, or gallops. No clubbing, cyanosis, edema.  Radials/DP/PT 2+ and equal bilaterally.  Respiratory:  Respirations regular and unlabored, clear to auscultation bilaterally. GI: Soft, nontender, nondistended, BS + x 4. MS: no deformity or atrophy. Skin: warm and dry, no rash. Neuro:  Strength and sensation are intact. Psych: Normal affect.  Accessory Clinical Findings    ECG personally reviewed by me today- *** - No acute changes  Lab Results  Component Value Date   WBC 5.8 03/04/2022   HGB 9.9 (L) 03/04/2022   HCT 30.5 (L) 03/04/2022   MCV 95.3 03/04/2022   PLT 308 03/04/2022   Lab Results  Component Value Date   CREATININE 1.16 (H) 03/04/2022   BUN 32 (H) 03/04/2022   NA 136 03/04/2022   K 4.0 03/04/2022   CL 103 03/04/2022   CO2 26 03/04/2022   Lab Results  Component Value Date   ALT 155 (H) 02/27/2022   AST 147 (H) 02/27/2022   ALKPHOS 85  02/27/2022   BILITOT 0.8 02/27/2022   Lab Results  Component Value Date   CHOL 189 01/09/2019   HDL 61 01/09/2019   LDLCALC 100 (H) 01/09/2019   TRIG 164 (H) 01/09/2019   CHOLHDL 3.1 01/09/2019    Lab Results  Component Value Date   HGBA1C 13.3 (H) 02/27/2022    Review of Prior Studies: Echocardiogram 02/27/2022 1. Left ventricular ejection fraction, by estimation, is 35 to 40%. The  left ventricle has moderately decreased function. The left ventricle  demonstrates global hypokinesis. The left ventricular internal cavity size  was moderately dilated. Left  ventricular diastolic parameters are consistent with Grade III diastolic  dysfunction (restrictive).   2. Right ventricular systolic function is low normal. The right  ventricular size is moderately enlarged. There is mildly elevated  pulmonary artery systolic pressure. The estimated right ventricular  systolic pressure is [id number] mmHg.   3. Left atrial size was moderately dilated.   4. Right atrial size was moderately dilated.   5. The mitral valve is degenerative. Mild mitral valve regurgitation. No  evidence of mitral stenosis.   6. The aortic valve is tricuspid. Aortic valve regurgitation is not  visualized. No aortic stenosis is present.   7. The inferior vena cava is normal in size with <50% respiratory  variability, suggesting right atrial pressure of 8 mmHg.   8. Evidence of atrial level shunting detected by color flow Doppler.  There is a small patent foramen ovale with predominantly left to right  shunting across the atrial septum.    Assessment & Plan   1.  ***    Current medicines are reviewed at length with the patient today.  I have spent *** min's  dedicated to the care of this patient on the date of this encounter to include pre-visit review of records, assessment, management and diagnostic testing,with shared decision making. Signed, Phill Myron. West Pugh, ANP, [facility]   04/05/2022 5:36 PM  Office  [phone] Fax [phone]  Notice: This dictation was prepared with Dragon dictation along with smaller phrase technology. Any transcriptional errors that result from this process are unintentional and may not be corrected upon review.

## 2022-04-07 ENCOUNTER — Ambulatory Visit: Payer: Medicare Other | Attending: Adult Health | Admitting: Adult Health

## 2022-04-25 ENCOUNTER — Ambulatory Visit
Admission: RE | Admit: 2022-04-25 | Discharge: 2022-04-25 | Disposition: A | Discharge status: Home / Self Care | Payer: Medicare Other | Source: Ambulatory Visit | Attending: Family Medicine | Admitting: Family Medicine

## 2022-04-25 DIAGNOSIS — K869 Disease of pancreas, unspecified: Secondary | ICD-10-CM

## 2022-04-25 DIAGNOSIS — K8689 Other specified diseases of pancreas: Secondary | ICD-10-CM

## 2022-04-25 MED ORDER — GADOPICLENOL 0.5 MMOL/ML IV SOLN
5.0000 mL | Freq: Once | INTRAVENOUS | Status: AC | PRN
  Administered 2022-04-25: 5 mL via INTRAVENOUS

## 2022-05-25 ENCOUNTER — Telehealth: Payer: Self-pay | Admitting: Cardiology

## 2022-05-25 MED ORDER — ENTRESTO 49-51 MG PO TABS: 1.0000 | ORAL_TABLET | Freq: Two times a day (BID) | ORAL | 3 refills | Status: DC

## 2022-05-25 NOTE — Telephone Encounter (Signed)
Pt c/o medication issue:  1. Name of Medication: sacubitril-valsartan (ENTRESTO) 49-51 MG   2. How are you currently taking this medication (dosage and times per day)?   3. Are you having a reaction (difficulty breathing--STAT)?   4. What is your medication issue?  Patient states she needs more than one month supply of this medication, and would like a call back to discuss.

## 2022-05-25 NOTE — Telephone Encounter (Signed)
RN  spoke to [employer] - he states a prescription was sent in for  quantity of 60 tablets with 3 refills -- ( enough for 1 month)    RN was transferred to pharmacist - new verbal order given  one tablet twice a day with quantity of 180 tablets with 3 refill.  Pharmacist stays  medication will go out at the end of the month.

## 2022-05-25 NOTE — Telephone Encounter (Signed)
Called left message on cell voice mail - medication Delene Loll  will be sent at the end of the month    Any question may call back .  Attempted the home number phone busy dial tone.Marland Kitchen

## 2022-05-25 NOTE — Telephone Encounter (Signed)
Spoke to patient she  states she received medication Entresto 49/51 mg. She only received a one month supply of medication . She states she should have received a 3 months supply.  Patient states she called [employer] patient assistance- Iit was reported that is how the prescription was sent and that the office would need to call to change quantity. RN informed patient would call patient assistance - find what is needed?

## 2022-06-20 NOTE — Progress Notes (Signed)
Cardiology Clinic Note   Patient Name: [name] Date of Encounter: 06/23/2022  Primary Care Provider:  Darrow Bussing, MD Primary Cardiologist:  Bryan Lemma, MD  Patient Profile     77 y.o. female with a history of non-obstructive CAD noted on cardiac catheterization in 11/2015,showed mild to moderate non-obstructive CAD with 15% stenosis of distal LAD, 30% ulcerated mid LCX disease, and 40% stenosis of mid RCA  non-ischemic cardiomyopathy; / chronic combined CHF with EF as low as 20% in the past but improved to 50-55% on last Echo in 09/2020, palpitations with brief runs of SVT and frequent PVCs/PAC on monitor in 05/2016, remote DVT, hypertension, hyperlipidemia, type 2 diabetes mellitus on insulin, CKD stage IIIb, remote breast cancer s/p lumpectomy and radiation as well as chemotherapy (Tamoxifen), and tobacco abuse   Was admitted to the hospital in January 2024 and seen by Dr. Carolan Clines on consultation in the setting of decompensated HFrEF.Echo was ordered and showed a drop in EF to 35-40% with global hypokinesis and grade III diastolic dysfunction, moderately enlarged RV with low normal systolic function, moderate bilateral enlargement, mild MR, mildly elevated PASP of 39.1 mmHg, and a small PFO with predominantly left to right shunting.    She was continued on home Entresto 49/51, carvedilol 3.125 mg twice daily, was not advised to take spironolactone or SGLT inhibitors due to AKI with a GFR 24.  She is here for overdue follow-up.       Past Medical History    Past Medical History:  Diagnosis Date   Breast cancer (HCC)    breast - left    Chronic diastolic CHF (congestive heart failure), NYHA class 2 (HCC)    a. cMRI 5/10: EF 38% // b. Echo 3/13/: EF 50-55, Gr 1 DD // c. Echo 8/14: EF 40, inf/inf-septal HK, Gr 1 DD, mild MR // d. Echo 5/17: EF 40-45, inf HK, Gr 1 DD, mild MR, mild LAE, reduced RVSF, mild TR, PASP 33 // e. Echo 10/17: EF 40-45, Gr 2 DD, mild MR, severe LAE; =>  09/2020 EF 50-55%, GrII DD.   Coronary artery disease, non-occlusive    a. nonobs by LHC in 2010 // b. Myoview 10/17: Lg infarct apex, distal ant and lat walls, no ischemia, EF 38; int risk  // c. LHC 10/17:  dLAD 15, pLCx 30, mRCA 40, LVEDP 15   Diabetes mellitus    NICM (nonischemic cardiomyopathy) (HCC)    Past Surgical History:  Procedure Laterality Date   APPENDECTOMY     BREAST LUMPECTOMY     lt breast   CARDIAC CATHETERIZATION N/A 12/20/2015   Procedure: Left Heart Cath and Coronary Angiography;  Surgeon: Yvonne Kendall, MD:: Mild to moderate, nonobstructive coronary artery disease, including 15% distal LAD stenosis, 30% ulcerated mid LCx disease, and 40% mid RCA stenosis.  Upper normal left ventricular filling pressure (LVEDP 15 mmHg).   RIGHT HEART CATH N/A 05/08/2016   Procedure: Right Heart Cath;  Surgeon: Dolores Patty, MD;  Location: [facility];; RA = 3, RV = 34/5, PA = 38/12 (21), PCW = 12; Fick cardiac output/index = 4.6/2.6; PVR = 1.9 WU, Ao sat = 96%; PA sat = 58%, 62%    TRANSTHORACIC ECHOCARDIOGRAM  11/2015   a) 11/2015: EF 40 and 45%.  GRII DD; b) 04/2016: EF 20% with grade 2 diastolic dysfunction   TRANSTHORACIC ECHOCARDIOGRAM  10/2016   a) EF 55-60%.  Normal LV function.  Mild DD.Marland Kitchen  Normal valve Fxn.;  b) 09/2020: EF 50 to 55%.  Normal function.  No R WMA.  Mild LVH.  GR 2 DD with elevated LAP.-Mild LA dilation.  Moderately elevated PAP estimated RVSP 47 mmHg, and RAP 8 mmHg.   TRIGGER FINGER RELEASE Left 04/23/2017   Procedure: RELEASE TRIGGER FINGER TO LEFT FORTH FINGER;  Surgeon: Louisa Second, MD;  Location: [facility];  Service: Plastics;  Laterality: Left;    Allergies  Allergies  Allergen Reactions   Tape Rash    Pt can tolerate the paper tape    History of Present Illness    [name] comes today with her daughter with complaints of not being able to get her Sherryll Burger filled appropriately, chronic leg pain, with history of  frequent falls due to leg weakness injuring both knees.  She feels dizzy when she stands up or gets out of bed or her car if she is in a rush.  She is going to begin physical therapy due to unsteadiness on her feet.  She continues to have bilateral chronic dependent edema in the lower extremities.  She has been on Lasix 40 mg daily which had been uptitrated.  She also complains of bilateral numbness and tingling in her legs bilaterally  Home Medications    Current Outpatient Medications  Medication Sig Dispense Refill   atorvastatin (LIPITOR) 40 MG tablet Take 1 tablet (40 mg total) by mouth daily. 90 tablet 3   benzonatate (TESSALON) 100 MG capsule Take 1 capsule (100 mg total) by mouth 3 (three) times daily as needed for cough. 30 capsule 0   furosemide (LASIX) 40 MG tablet Take 1 tablet (40 mg total) by mouth daily. 30 tablet 3   insulin NPH-regular Human (70-30) 100 UNIT/ML injection 22 units with breakfast, and 12 units with supper. (Patient taking differently: Inject 25 Units into the skin as needed (for high BS).) 10 mL    sacubitril-valsartan (ENTRESTO) 49-51 MG Take 1 tablet by mouth 2 (two) times daily. 28 tablet 0   carvedilol (COREG) 3.125 MG tablet Take 1 tablet (3.125 mg total) by mouth 2 (two) times daily with a meal. 180 tablet 3   guaiFENesin-dextromethorphan (ROBITUSSIN DM) 100-10 MG/5ML syrup Take 5 mLs by mouth every 4 (four) hours as needed for cough (chest congestion). (Patient not taking: Reported on 06/23/2022) 118 mL 0   No current facility-administered medications for this visit.     Family History    Family History  Problem Relation Age of Onset   Stroke Father    Diabetes Maternal Grandmother    She indicated that her mother is alive. She indicated that her father is deceased. She indicated that the status of her maternal grandmother is unknown.  Social History    Social History   Socioeconomic History   Marital status: Widowed    Spouse name: Not on file    Number of children: Not on file   Years of education: Not on file   Highest education level: Not on file  Occupational History   Not on file  Tobacco Use   Smoking status: Former    Years: 30    Types: Cigarettes    Quit date: 09/29/2012    Years since quitting: 9.7   Smokeless tobacco: Never  Substance and Sexual Activity   Alcohol use: No   Drug use: No   Sexual activity: Not on file  Other Topics Concern   Not on file  Social History Narrative   Not on file  Social Determinants of Health   Financial Resource Strain: Not on file  Food Insecurity: No Food Insecurity (02/27/2022)   Hunger Vital Sign    Worried About Running Out of Food in the Last Year: Never true    Ran Out of Food in the Last Year: Never true  Transportation Needs: No Transportation Needs (02/27/2022)   PRAPARE - Administrator, Civil Service (Medical): No    Lack of Transportation (Non-Medical): No  Physical Activity: Not on file  Stress: Not on file  Social Connections: Not on file  Intimate Partner Violence: Not At Risk (02/27/2022)   Humiliation, Afraid, Rape, and Kick questionnaire    Fear of Current or Ex-Partner: No    Emotionally Abused: No    Physically Abused: No    Sexually Abused: No     Review of Systems    General:  No chills, fever, night sweats or weight changes.  Cardiovascular:  No chest pain, dyspnea on exertion, positive for chronic dependent edema, orthopnea, palpitations, paroxysmal nocturnal dyspnea.  Positive for positional dizziness when standing quickly Dermatological: No rash, lesions/masses Respiratory: No cough, dyspnea Urologic: No hematuria, dysuria Abdominal:   No nausea, vomiting, diarrhea, bright red blood per rectum, melena, or hematemesis Neurologic:  No visual changes, wkns, changes in mental status. All other systems reviewed and are otherwise negative except as noted above.     Physical Exam    VS:  BP 132/60 (BP Location: Right Arm, Patient  Position: Sitting, Cuff Size: Normal)   Pulse 80   Ht 5\' 3"  (1.6 m)   Wt 128 lb 12.8 oz (58.4 kg)   SpO2 99%   BMI 22.82 kg/m  , BMI Body mass index is 22.82 kg/m.     GEN: Well nourished, well developed, in no acute distress. HEENT: normal. Neck: Supple, no JVD, carotid bruits, or masses. Cardiac: RRR, no murmurs, rubs, or gallops. No clubbing, cyanosis, mild dependent edema.  Radials, 2+ and equal bilaterally.  Difficult to assess DP and PT. Respiratory:  Respirations regular and unlabored, clear to auscultation bilaterally. GI: Soft, nontender, nondistended, BS + x 4. MS: no deformity or atrophy. Skin: warm and dry, no rash. Neuro:  Strength and sensation are intact. Psych: Normal affect.  Accessory Clinical Findings    ECG personally reviewed by me today not completed this office visit today.  Lab Results  Component Value Date   WBC 5.8 03/04/2022   HGB 9.9 (L) 03/04/2022   HCT 30.5 (L) 03/04/2022   MCV 95.3 03/04/2022   PLT 308 03/04/2022   Lab Results  Component Value Date   CREATININE 1.16 (H) 03/04/2022   BUN 32 (H) 03/04/2022   NA 136 03/04/2022   K 4.0 03/04/2022   CL 103 03/04/2022   CO2 26 03/04/2022   Lab Results  Component Value Date   ALT 155 (H) 02/27/2022   AST 147 (H) 02/27/2022   ALKPHOS 85 02/27/2022   BILITOT 0.8 02/27/2022   Lab Results  Component Value Date   CHOL 189 01/09/2019   HDL 61 01/09/2019   LDLCALC 100 (H) 01/09/2019   TRIG 164 (H) 01/09/2019   CHOLHDL 3.1 01/09/2019    Lab Results  Component Value Date   HGBA1C 13.3 (H) 02/27/2022    Review of Prior Studies: Echocardiogram 02/28/2022: Impressions: 1. Left ventricular ejection fraction, by estimation, is 35 to 40%. The  left ventricle has moderately decreased function. The left ventricle  demonstrates global hypokinesis. The left ventricular internal cavity  size  was moderately dilated. Left  ventricular diastolic parameters are consistent with Grade III diastolic   dysfunction (restrictive).   2. Right ventricular systolic function is low normal. The right  ventricular size is moderately enlarged. There is mildly elevated  pulmonary artery systolic pressure. The estimated right ventricular  systolic pressure is 39.1 mmHg.   3. Left atrial size was moderately dilated.   4. Right atrial size was moderately dilated.   5. The mitral valve is degenerative. Mild mitral valve regurgitation. No  evidence of mitral stenosis.   6. The aortic valve is tricuspid. Aortic valve regurgitation is not  visualized. No aortic stenosis is present.   7. The inferior vena cava is normal in size with <50% respiratory  variability, suggesting right atrial pressure of 8 mmHg.   8. Evidence of atrial level shunting detected by color flow Doppler.  There is a small patent foramen ovale with predominantly left to right  shunting across the atrial septum.   Comparison(s): The left ventricular function is worsened. Prior EF 50-55%.   Assessment & Plan   Chronic combined CHF: Grade HFrEF with a EF of 35 to 40% on most recent echo, along with grade 3 diastolic dysfunction.  I am going to refer her to advanced heart failure clinic for further management and medication adjustments.  I reviewed and given her refills on Entresto 49 to 51 mg, provided 90-day supply, and she is receiving this through [facility].  Pharmacy has been involved and has helped her to get her medications again regularly as she had been skipping doses that she was running out and could not get refills.  Samples are provided before leaving the office today.  Given chronic kidney disease stage III, there is concerned about starting SGLT inhibitors however they are renal protective.  Will defer to advanced heart failure providers to review and make recommendations.  This has been discussed with the patient and her daughter and they are in agreement for the referral.  2.  Positional dizziness: Patient refused orthostatic  blood pressures at this time due to fear of falling.  May need to consider reducing Lasix.  Currently she is receiving good diuresis which helps to diminish chronic lower extremity edema.  She has been given safety instructions to sit on the side of the bed before arising, to stand holding onto something before getting up and walking to prevent recurrent falls.  3.  Bilateral leg weakness: Difficult checking peripheral pulses.  I will check ABIs with Doppler studies, in the setting of diabetes and known coronary artery disease.  She wishes to begin physical therapy but I would like to make sure she has good blood flow to her lower extremities before proceeding.  4.  Chronic kidney disease stage III: Being followed by PCP through [facility] physicians.  Consider nephrology consultation in collaboration with advanced heart failure team.         Signed, Bettey Mare. Liborio Nixon, ANP, AACC   06/23/2022 5:07 PM      Office [phone] Fax [phone]  Notice: This dictation was prepared with Dragon dictation along with smaller phrase technology. Any transcriptional errors that result from this process are unintentional and may not be corrected upon review.

## 2022-06-23 ENCOUNTER — Encounter: Payer: Self-pay | Admitting: Adult Health

## 2022-06-23 ENCOUNTER — Ambulatory Visit: Payer: Medicare Other | Attending: Adult Health | Admitting: Adult Health

## 2022-06-23 VITALS — BP 132/60 | HR 80 | Ht 63.0 in | Wt 128.8 lb

## 2022-06-23 DIAGNOSIS — I5042 Chronic combined systolic (congestive) and diastolic (congestive) heart failure: Secondary | ICD-10-CM

## 2022-06-23 DIAGNOSIS — E119 Type 2 diabetes mellitus without complications: Secondary | ICD-10-CM | POA: Diagnosis present

## 2022-06-23 DIAGNOSIS — I10 Essential (primary) hypertension: Secondary | ICD-10-CM

## 2022-06-23 DIAGNOSIS — R29898 Other symptoms and signs involving the musculoskeletal system: Secondary | ICD-10-CM

## 2022-06-23 DIAGNOSIS — Z794 Long term (current) use of insulin: Secondary | ICD-10-CM

## 2022-06-23 MED ORDER — CARVEDILOL 3.125 MG PO TABS: 3.1250 mg | ORAL_TABLET | Freq: Two times a day (BID) | ORAL | 3 refills | Status: DC

## 2022-06-23 MED ORDER — ENTRESTO 49-51 MG PO TABS: 1.0000 | ORAL_TABLET | Freq: Two times a day (BID) | ORAL | 0 refills | Status: DC

## 2022-06-23 MED ORDER — SACUBITRIL-VALSARTAN 49-51 MG PO TABS: 1.0000 | ORAL_TABLET | Freq: Two times a day (BID) | ORAL | 0 refills | Status: DC

## 2022-06-23 MED ORDER — ENTRESTO 49-51 MG PO TABS: 1.0000 | ORAL_TABLET | Freq: Two times a day (BID) | ORAL | 3 refills | Status: DC

## 2022-06-23 NOTE — Patient Instructions (Signed)
Medication Instructions:  No Changes *If you need a refill on your cardiac medications before your next appointment, please call your pharmacy*   Lab Work: No labs If you have labs (blood work) drawn today and your tests are completely normal, you will receive your results only by: MyChart Message (if you have MyChart) OR A paper copy in the mail If you have any lab test that is abnormal or we need to change your treatment, we will call you to review the results.   Testing/Procedures: [address], [address]. Your physician has requested that you have an ankle brachial index (ABI). During this test an ultrasound and blood pressure cuff are used to evaluate the arteries that supply the arms and legs with blood. Allow thirty minutes for this exam. There are no restrictions or special instructions.    Follow-Up: At [facility], you and your health needs are our priority.  As part of our continuing mission to provide you with exceptional heart care, we have created designated Provider Care Teams.  These Care Teams include your primary Cardiologist (physician) and Advanced Practice Providers (APPs -  Physician Assistants and Nurse Practitioners) who all work together to provide you with the care you need, when you need it.  We recommend signing up for the patient portal called "MyChart".  Sign up information is provided on this After Visit Summary.  MyChart is used to connect with patients for Virtual Visits (Telemedicine).  Patients are able to view lab/test results, encounter notes, upcoming appointments, etc.  Non-urgent messages can be sent to your provider as well.   To learn more about what you can do with MyChart, go to [email or web address].    Your next appointment:   3 month(s)  Provider:   Joni Reining, DNP, ANP  or, Bryan Lemma, MD

## 2022-06-26 ENCOUNTER — Other Ambulatory Visit: Payer: Self-pay | Admitting: Adult Health

## 2022-06-26 DIAGNOSIS — I5042 Chronic combined systolic (congestive) and diastolic (congestive) heart failure: Secondary | ICD-10-CM

## 2022-06-29 ENCOUNTER — Telehealth: Payer: Self-pay | Admitting: Cardiology

## 2022-06-29 NOTE — Telephone Encounter (Signed)
Pt c/o medication issue:  1. Name of Medication: sacubitril-valsartan (ENTRESTO) 49-51 MG   2. How are you currently taking this medication (dosage and times per day)? As prescribed   3. Are you having a reaction (difficulty breathing--STAT)? No   4. What is your medication issue? [employer] received prescription as 60 tablets when she normally gets 180 tablets due to her year approval. [employer] has still not received the updated prescription. She is requesting [employer] be contacted at the number [phone] to sort this out. Prescription # is [id number].    Please advise.

## 2022-06-29 NOTE — Telephone Encounter (Signed)
Called patient, advised that the prescription was changed via verbal order back in April (good for 1 year). I called [employer] and they sent out her 90 day supply yesterday, she should receive it tomorrow 05/10. Patient verbalized understanding, thankful for call back.

## 2022-07-12 ENCOUNTER — Ambulatory Visit (HOSPITAL_COMMUNITY)
Admission: RE | Admit: 2022-07-12 | Payer: Medicare Other | Source: Ambulatory Visit | Attending: Adult Health | Admitting: Adult Health

## 2022-08-04 ENCOUNTER — Ambulatory Visit (HOSPITAL_COMMUNITY)
Admission: RE | Admit: 2022-08-04 | Discharge: 2022-08-04 | Disposition: A | Discharge status: Home / Self Care | Payer: Medicare Other | Source: Ambulatory Visit | Attending: Adult Health | Admitting: Adult Health

## 2022-08-04 DIAGNOSIS — R29898 Other symptoms and signs involving the musculoskeletal system: Secondary | ICD-10-CM | POA: Diagnosis present

## 2022-08-04 DIAGNOSIS — I5042 Chronic combined systolic (congestive) and diastolic (congestive) heart failure: Secondary | ICD-10-CM | POA: Diagnosis present

## 2022-08-04 LAB — VAS US ABI WITH/WO TBI
Left ABI: 0.88
Right ABI: 1.21

## 2022-08-07 ENCOUNTER — Telehealth: Payer: Self-pay

## 2022-08-07 DIAGNOSIS — R29898 Other symptoms and signs involving the musculoskeletal system: Secondary | ICD-10-CM

## 2022-08-07 NOTE — Telephone Encounter (Addendum)
Called patient regarding results. Patient has understanding of results. Patient advised of referral to Dr. Allyson Sabal for PV.----- Message from Jodelle Gross, NP sent at 08/06/2022  2:58 PM EDT ----- We are referring her back to Dr.Berry to make recommendations for management of her left leg blockages.

## 2022-08-07 NOTE — Telephone Encounter (Signed)
-----   Message from Jodelle Gross, NP sent at 08/06/2022  2:57 PM EDT ----- Her ABI's are showing progression of blockages in both her right and left lower legs.  Please refer her to Dr. Allyson Sabal for his recommendations.

## 2022-09-04 ENCOUNTER — Other Ambulatory Visit: Payer: Self-pay | Admitting: Student

## 2022-09-04 ENCOUNTER — Telehealth: Payer: Self-pay | Admitting: Cardiovascular Disease

## 2022-09-04 ENCOUNTER — Other Ambulatory Visit: Payer: Self-pay | Admitting: *Deleted

## 2022-09-04 MED ORDER — FUROSEMIDE 40 MG PO TABS: 40.0000 mg | ORAL_TABLET | Freq: Every day | ORAL | 1 refills | Status: DC

## 2022-09-04 MED ORDER — CARVEDILOL 3.125 MG PO TABS: 3.1250 mg | ORAL_TABLET | Freq: Two times a day (BID) | ORAL | 1 refills | Status: DC

## 2022-09-04 MED ORDER — ATORVASTATIN CALCIUM 40 MG PO TABS: 40.0000 mg | ORAL_TABLET | Freq: Every day | ORAL | 1 refills | Status: DC

## 2022-09-04 NOTE — Telephone Encounter (Signed)
*  STAT* If patient is at the pharmacy, call can be transferred to refill team.   1. Which medications need to be refilled? (please list name of each medication and dose if known)   atorvastatin (LIPITOR) 40 MG tablet  furosemide (LASIX) 40 MG tablet  carvedilol (COREG) 3.125 MG tablet  2. Which pharmacy/location (including street and city if local pharmacy) is medication to be sent to? [facility] [address] [place], [place] - [address]    3. Do they need a 30 day or 90 day supply? 90 day

## 2022-09-21 ENCOUNTER — Telehealth: Payer: Self-pay | Admitting: Adult Health

## 2022-09-21 NOTE — Telephone Encounter (Signed)
Pt states her legs are hurting really bad and not sure what she should do until her consult with Dr. Allyson Sabal. Please advise.

## 2022-09-21 NOTE — Telephone Encounter (Signed)
Patient has appt for Dr Allyson Sabal on August 23 rd.  She had 3 mo F/U for the 12th.  The F/U appt. Moved up to next week for F/U and to see if any recommendations while waiting on appt with Dr Allyson Sabal

## 2022-09-24 ENCOUNTER — Other Ambulatory Visit: Payer: Self-pay

## 2022-09-24 ENCOUNTER — Encounter (HOSPITAL_COMMUNITY): Payer: Self-pay | Admitting: Emergency Medicine

## 2022-09-24 ENCOUNTER — Emergency Department (HOSPITAL_COMMUNITY): Payer: Medicare Other

## 2022-09-24 ENCOUNTER — Inpatient Hospital Stay (HOSPITAL_COMMUNITY)
Admission: EM | Admit: 2022-09-24 | Discharge: 2022-10-03 | DRG: 291 | Disposition: A | Discharge status: Home / Self Care | Payer: Medicare Other | Attending: Internal Medicine | Admitting: Internal Medicine

## 2022-09-24 DIAGNOSIS — Z794 Long term (current) use of insulin: Secondary | ICD-10-CM

## 2022-09-24 DIAGNOSIS — Z823 Family history of stroke: Secondary | ICD-10-CM

## 2022-09-24 DIAGNOSIS — N1832 Chronic kidney disease, stage 3b: Secondary | ICD-10-CM | POA: Diagnosis present

## 2022-09-24 DIAGNOSIS — Z91048 Other nonmedicinal substance allergy status: Secondary | ICD-10-CM

## 2022-09-24 DIAGNOSIS — I5043 Acute on chronic combined systolic (congestive) and diastolic (congestive) heart failure: Secondary | ICD-10-CM | POA: Diagnosis present

## 2022-09-24 DIAGNOSIS — I428 Other cardiomyopathies: Secondary | ICD-10-CM | POA: Diagnosis present

## 2022-09-24 DIAGNOSIS — Z1152 Encounter for screening for COVID-19: Secondary | ICD-10-CM

## 2022-09-24 DIAGNOSIS — Z833 Family history of diabetes mellitus: Secondary | ICD-10-CM

## 2022-09-24 DIAGNOSIS — I2510 Atherosclerotic heart disease of native coronary artery without angina pectoris: Secondary | ICD-10-CM | POA: Diagnosis present

## 2022-09-24 DIAGNOSIS — I739 Peripheral vascular disease, unspecified: Secondary | ICD-10-CM

## 2022-09-24 DIAGNOSIS — Z853 Personal history of malignant neoplasm of breast: Secondary | ICD-10-CM

## 2022-09-24 DIAGNOSIS — Z79899 Other long term (current) drug therapy: Secondary | ICD-10-CM

## 2022-09-24 DIAGNOSIS — E119 Type 2 diabetes mellitus without complications: Secondary | ICD-10-CM

## 2022-09-24 DIAGNOSIS — I130 Hypertensive heart and chronic kidney disease with heart failure and stage 1 through stage 4 chronic kidney disease, or unspecified chronic kidney disease: Secondary | ICD-10-CM | POA: Diagnosis not present

## 2022-09-24 DIAGNOSIS — E1151 Type 2 diabetes mellitus with diabetic peripheral angiopathy without gangrene: Secondary | ICD-10-CM | POA: Diagnosis present

## 2022-09-24 DIAGNOSIS — E876 Hypokalemia: Secondary | ICD-10-CM | POA: Diagnosis not present

## 2022-09-24 DIAGNOSIS — I10 Essential (primary) hypertension: Secondary | ICD-10-CM | POA: Diagnosis present

## 2022-09-24 DIAGNOSIS — I5023 Acute on chronic systolic (congestive) heart failure: Secondary | ICD-10-CM

## 2022-09-24 DIAGNOSIS — D72819 Decreased white blood cell count, unspecified: Secondary | ICD-10-CM

## 2022-09-24 DIAGNOSIS — J449 Chronic obstructive pulmonary disease, unspecified: Secondary | ICD-10-CM | POA: Diagnosis present

## 2022-09-24 DIAGNOSIS — E785 Hyperlipidemia, unspecified: Secondary | ICD-10-CM | POA: Diagnosis present

## 2022-09-24 DIAGNOSIS — E1165 Type 2 diabetes mellitus with hyperglycemia: Secondary | ICD-10-CM | POA: Diagnosis not present

## 2022-09-24 DIAGNOSIS — I509 Heart failure, unspecified: Principal | ICD-10-CM

## 2022-09-24 DIAGNOSIS — E11649 Type 2 diabetes mellitus with hypoglycemia without coma: Secondary | ICD-10-CM | POA: Diagnosis not present

## 2022-09-24 DIAGNOSIS — E1169 Type 2 diabetes mellitus with other specified complication: Secondary | ICD-10-CM | POA: Insufficient documentation

## 2022-09-24 DIAGNOSIS — Z888 Allergy status to other drugs, medicaments and biological substances status: Secondary | ICD-10-CM

## 2022-09-24 DIAGNOSIS — Z87891 Personal history of nicotine dependence: Secondary | ICD-10-CM

## 2022-09-24 DIAGNOSIS — E1122 Type 2 diabetes mellitus with diabetic chronic kidney disease: Secondary | ICD-10-CM | POA: Diagnosis present

## 2022-09-24 DIAGNOSIS — Z7984 Long term (current) use of oral hypoglycemic drugs: Secondary | ICD-10-CM

## 2022-09-24 DIAGNOSIS — D509 Iron deficiency anemia, unspecified: Secondary | ICD-10-CM

## 2022-09-24 NOTE — ED Provider Notes (Signed)
[place] EMERGENCY DEPARTMENT AT [facility] Provider Note   CSN: [id number] Arrival date & time: 09/24/22  2331     History  Chief Complaint  Patient presents with   Cough    [name] is a 77 y.o. female.  HPI 77 year old female with history of DM type II, hypertension, hyperlipidemia, CKD, COPD, CHF with an EF of 35 to 40%, bilateral PAD who presents to the ER with complaints of fatigue, difficulty ambulating, pain and swelling in lower extremities, cough and shortness of breath over the last several days.  She reports she was told recently that she has "blockages", and her legs and she has an appointment with Dr. Gery Pray with vascular next month but states that she is having difficulty ambulating around her home and does not think she can wait that long to her appointment.  She also endorses a nonproductive cough.  Denies any active chest pain.    Home Medications Prior to Admission medications   Medication Sig Start Date End Date Taking? Authorizing Provider  atorvastatin (LIPITOR) 40 MG tablet Take 1 tablet by mouth once daily 09/05/22   Marykay Lex, MD  atorvastatin (LIPITOR) 40 MG tablet Take 1 tablet (40 mg total) by mouth daily. 09/04/22   Runell Gess, MD  benzonatate (TESSALON) 100 MG capsule Take 1 capsule (100 mg total) by mouth 3 (three) times daily as needed for cough. 03/04/22   Rai, Delene Ruffini, MD  carvedilol (COREG) 3.125 MG tablet Take 1 tablet (3.125 mg total) by mouth 2 (two) times daily with a meal. 09/04/22   Runell Gess, MD  furosemide (LASIX) 40 MG tablet Take 1 tablet (40 mg total) by mouth daily. 09/04/22   Runell Gess, MD  guaiFENesin-dextromethorphan (ROBITUSSIN DM) 100-10 MG/5ML syrup Take 5 mLs by mouth every 4 (four) hours as needed for cough (chest congestion). Patient not taking: Reported on 06/23/2022 03/04/22   Rai, Delene Ruffini, MD  insulin NPH-regular Human (70-30) 100 UNIT/ML injection 22 units with breakfast, and 12 units  with supper. Patient taking differently: Inject 25 Units into the skin as needed (for high BS). 12/10/20   Romero Belling, MD  sacubitril-valsartan (ENTRESTO) 49-51 MG Take 1 tablet by mouth 2 (two) times daily. 06/23/22   Jodelle Gross, NP      Allergies    Tape    Review of Systems   Review of Systems Ten systems reviewed and are negative for acute change, except as noted in the HPI.   Physical Exam Updated Vital Signs BP 119/75   Pulse 64   Temp 97.6 F (36.4 C) (Oral)   Resp (!) 26   Ht 5\' 3"  (1.6 m)   Wt 68.4 kg   SpO2 98%   BMI 26.71 kg/m  Physical Exam Vitals and nursing note reviewed.  Constitutional:      General: She is not in acute distress.    Appearance: She is well-developed.  HENT:     Head: Normocephalic and atraumatic.  Eyes:     Conjunctiva/sclera: Conjunctivae normal.  Cardiovascular:     Rate and Rhythm: Normal rate and regular rhythm.     Heart sounds: No murmur heard.    Comments: +JVD  Pulmonary:     Effort: Pulmonary effort is normal. No respiratory distress.     Breath sounds: Rales present. No wheezing.  Abdominal:     Palpations: Abdomen is soft.     Tenderness: There is no abdominal tenderness.  Musculoskeletal:  General: Swelling present.     Cervical back: Neck supple.     Right lower leg: Edema present.     Left lower leg: Edema present.     Comments: 3+ pitting LE edema bilaterally.  DP pulses located with Doppler bilaterally  Skin:    General: Skin is warm and dry.     Capillary Refill: Capillary refill takes less than 2 seconds.  Neurological:     General: No focal deficit present.     Mental Status: She is alert and oriented to person, place, and time.  Psychiatric:        Mood and Affect: Mood normal.     ED Results / Procedures / Treatments   Labs (all labs ordered are listed, but only abnormal results are displayed) Labs Reviewed  CBC WITH DIFFERENTIAL/PLATELET - Abnormal; Notable for the following  components:      Result Value   WBC 3.1 (*)    RBC 3.46 (*)    Hemoglobin 9.3 (*)    HCT 29.7 (*)    RDW 18.0 (*)    Neutro Abs 1.3 (*)    All other components within normal limits  COMPREHENSIVE METABOLIC PANEL - Abnormal; Notable for the following components:   Glucose, Bld 179 (*)    BUN 32 (*)    Creatinine, Ser 1.65 (*)    Calcium 8.6 (*)    Total Protein 5.8 (*)    Albumin 3.0 (*)    GFR, Estimated 32 (*)    All other components within normal limits  BRAIN NATRIURETIC PEPTIDE - Abnormal; Notable for the following components:   B Natriuretic Peptide >4,500.0 (*)    All other components within normal limits  TROPONIN I (HIGH SENSITIVITY) - Abnormal; Notable for the following components:   Troponin I (High Sensitivity) 1,538 (*)    All other components within normal limits  TROPONIN I (HIGH SENSITIVITY) - Abnormal; Notable for the following components:   Troponin I (High Sensitivity) 1,438 (*)    All other components within normal limits  RESP PANEL BY RT-PCR (RSV, FLU A&B, COVID)  RVPGX2  LIPASE, BLOOD  URINALYSIS, ROUTINE W REFLEX MICROSCOPIC  FERRITIN  IRON AND TIBC  TRANSFERRIN    EKG EKG Interpretation Date/Time:  Sunday September 24 2022 23:56:03 EDT Ventricular Rate:  74 PR Interval:  179 QRS Duration:  105 QT Interval:  397 QTC Calculation: 407 R Axis:   -43  Text Interpretation: Sinus rhythm Multiform ventricular premature complexes Left anterior fascicular block Low voltage, precordial leads Probable anteroseptal infarct, old Abnormal T, consider ischemia, lateral leads Confirmed by Kennis Carina [phone]) on 09/25/2022 12:42:05 AM  Radiology DG Chest Portable 1 View  Result Date: 09/25/2022 CLINICAL DATA:  Cough. EXAM: PORTABLE CHEST 1 VIEW COMPARISON:  None Available. FINDINGS: The cardiac silhouette is enlarged and unchanged in size. Mild perihilar prominence of the pulmonary vasculature is seen. There is marked severity calcification of the aortic arch.  Mild, diffuse, chronic appearing increased interstitial lung markings are seen. Mild atelectasis and/or infiltrate is noted within the left lung base. No pleural effusion or pneumothorax is identified. The visualized skeletal structures are unremarkable. IMPRESSION: 1. Cardiomegaly with mild pulmonary vascular congestion. 2. Mild left basilar atelectasis and/or infiltrate. Electronically Signed   By: Aram Candela M.D.   On: 09/25/2022 00:21    Procedures Procedures    Medications Ordered in ED Medications  furosemide (LASIX) injection 40 mg (40 mg Intravenous Given 09/25/22 0030)  furosemide (LASIX) injection 40 mg (  40 mg Intravenous Given 09/25/22 0310)    ED Course/ Medical Decision Making/ A&P                                  Medical Decision Making Amount and/or Complexity of Data Reviewed Labs: ordered. Radiology: ordered. ECG/medicine tests:  Decision-making details documented in ED Course.  Risk Prescription drug management.  77 year old female who presents to the ER with complaints of fatigue, shortness of breath, lower extremity edema and pain to lower extremities.  Vitals overall are reassuring, though she is slightly tachypneic on arrival with respirations in the mid 20s.  Physical exam with 3+ pitting edema to lower extremities, positive JVD, positive rales.  She is not requiring supplemental oxygen.  DDx includes pneumonia, CHF exacerbation, PAD pain.  I was able to Doppler bilateral DP pulses.  Labs ordered, reviewed, interpreted by me.  CBC with leukopenia and decreased neutrophils which appears to be new.  CMP with creatinine of 1.65, though elevated from baseline but overall stable.  BUN of 32.  Lipase is normal.  BNP is greater than 4500.  Initial troponin 1838, repeat 1438.  I discussed with cardiology, who evaluated the patient at bedside.  Troponinemia likely secondary to CHF exacerbation, no indication for heparin infusion at this time.  She was given a dose of Lasix  with little output.  Chest x-ray reviewed, agree with radiology read, noted cardiomegaly with clear congestion and mild left basilar atelectasis versus infiltrate. Discussed w/ cardiology, given how grossly volume overloaded she appears + difficulty ambulating at home in the setting of living alone, I believe the patient would benefit from admission for further diuresis.  She was given a dose of doxycycline for CAP. I also think she would benefit from evaluation with vascular inpatient for possible stenting given worsening pain. Consulted hospitalist for admission    Final Clinical Impression(s) / ED Diagnoses Final diagnoses:  Acute on chronic congestive heart failure, unspecified heart failure type (HCC)  PAD (peripheral artery disease) (HCC)  Leukopenia, unspecified type    Rx / DC Orders ED Discharge Orders     None         Leone Brand 09/25/22 0322    Sabas Sous, MD 09/25/22 0730

## 2022-09-24 NOTE — ED Triage Notes (Signed)
Pt BIB [facility] from from home, c/o cough x 3 days, d/n x 2 days, fatigue/weakness, body aches, chills, increased swelling to bilateral feet and legs, worse on L, pt has not been eating and has not taken insulin, CBG 330, pt had insulin upon EMS arrival

## 2022-09-24 NOTE — ED Provider Notes (Incomplete)
  [place] EMERGENCY DEPARTMENT AT [facility] Provider Note   CSN: [id number] Arrival date & time: 09/24/22  2331     History {Add pertinent medical, surgical, social history, OB history to HPI:1} Chief Complaint  Patient presents with  . Cough    [name] is a 77 y.o. female.  HPI     Home Medications Prior to Admission medications   Medication Sig Start Date End Date Taking? Authorizing Provider  atorvastatin (LIPITOR) 40 MG tablet Take 1 tablet by mouth once daily 09/05/22   Marykay Lex, MD  atorvastatin (LIPITOR) 40 MG tablet Take 1 tablet (40 mg total) by mouth daily. 09/04/22   Runell Gess, MD  benzonatate (TESSALON) 100 MG capsule Take 1 capsule (100 mg total) by mouth 3 (three) times daily as needed for cough. 03/04/22   Rai, Delene Ruffini, MD  carvedilol (COREG) 3.125 MG tablet Take 1 tablet (3.125 mg total) by mouth 2 (two) times daily with a meal. 09/04/22   Runell Gess, MD  furosemide (LASIX) 40 MG tablet Take 1 tablet (40 mg total) by mouth daily. 09/04/22   Runell Gess, MD  guaiFENesin-dextromethorphan (ROBITUSSIN DM) 100-10 MG/5ML syrup Take 5 mLs by mouth every 4 (four) hours as needed for cough (chest congestion). Patient not taking: Reported on 06/23/2022 03/04/22   Rai, Delene Ruffini, MD  insulin NPH-regular Human (70-30) 100 UNIT/ML injection 22 units with breakfast, and 12 units with supper. Patient taking differently: Inject 25 Units into the skin as needed (for high BS). 12/10/20   Romero Belling, MD  sacubitril-valsartan (ENTRESTO) 49-51 MG Take 1 tablet by mouth 2 (two) times daily. 06/23/22   Jodelle Gross, NP      Allergies    Tape    Review of Systems   Review of Systems  Physical Exam Updated Vital Signs BP 115/73 (BP Location: Left Arm)   Pulse 68   Temp 97.6 F (36.4 C) (Oral)   Resp 18   Ht 5\' 3"  (1.6 m)   Wt 58.1 kg   SpO2 100%   BMI 22.67 kg/m  Physical Exam  ED Results / Procedures / Treatments    Labs (all labs ordered are listed, but only abnormal results are displayed) Labs Reviewed - No data to display  EKG None  Radiology No results found.  Procedures Procedures  {Document cardiac monitor, telemetry assessment procedure when appropriate:1}  Medications Ordered in ED Medications - No data to display  ED Course/ Medical Decision Making/ A&P   {   Click here for ABCD2, HEART and other calculatorsREFRESH Note before signing :1}                              Medical Decision Making Amount and/or Complexity of Data Reviewed Labs: ordered. Radiology: ordered.   ***  {Document critical care time when appropriate:1} {Document review of labs and clinical decision tools ie heart score, Chads2Vasc2 etc:1}  {Document your independent review of radiology images, and any outside records:1} {Document your discussion with family members, caretakers, and with consultants:1} {Document social determinants of health affecting pt's care:1} {Document your decision making why or why not admission, treatments were needed:1} Final Clinical Impression(s) / ED Diagnoses Final diagnoses:  None    Rx / DC Orders ED Discharge Orders     None

## 2022-09-25 ENCOUNTER — Observation Stay (HOSPITAL_COMMUNITY): Payer: Medicare Other

## 2022-09-25 ENCOUNTER — Other Ambulatory Visit (HOSPITAL_COMMUNITY): Payer: Self-pay

## 2022-09-25 ENCOUNTER — Encounter (HOSPITAL_COMMUNITY): Payer: Self-pay | Admitting: Internal Medicine

## 2022-09-25 DIAGNOSIS — I10 Essential (primary) hypertension: Secondary | ICD-10-CM | POA: Diagnosis not present

## 2022-09-25 DIAGNOSIS — E11649 Type 2 diabetes mellitus with hypoglycemia without coma: Secondary | ICD-10-CM | POA: Diagnosis not present

## 2022-09-25 DIAGNOSIS — E785 Hyperlipidemia, unspecified: Secondary | ICD-10-CM | POA: Diagnosis present

## 2022-09-25 DIAGNOSIS — Z1152 Encounter for screening for COVID-19: Secondary | ICD-10-CM | POA: Diagnosis not present

## 2022-09-25 DIAGNOSIS — E1169 Type 2 diabetes mellitus with other specified complication: Secondary | ICD-10-CM

## 2022-09-25 DIAGNOSIS — D509 Iron deficiency anemia, unspecified: Secondary | ICD-10-CM | POA: Diagnosis present

## 2022-09-25 DIAGNOSIS — Z888 Allergy status to other drugs, medicaments and biological substances status: Secondary | ICD-10-CM | POA: Diagnosis not present

## 2022-09-25 DIAGNOSIS — I428 Other cardiomyopathies: Secondary | ICD-10-CM | POA: Diagnosis present

## 2022-09-25 DIAGNOSIS — I5023 Acute on chronic systolic (congestive) heart failure: Secondary | ICD-10-CM

## 2022-09-25 DIAGNOSIS — J449 Chronic obstructive pulmonary disease, unspecified: Secondary | ICD-10-CM | POA: Diagnosis present

## 2022-09-25 DIAGNOSIS — Z87891 Personal history of nicotine dependence: Secondary | ICD-10-CM | POA: Diagnosis not present

## 2022-09-25 DIAGNOSIS — E1165 Type 2 diabetes mellitus with hyperglycemia: Secondary | ICD-10-CM | POA: Diagnosis not present

## 2022-09-25 DIAGNOSIS — E1151 Type 2 diabetes mellitus with diabetic peripheral angiopathy without gangrene: Secondary | ICD-10-CM | POA: Diagnosis present

## 2022-09-25 DIAGNOSIS — I130 Hypertensive heart and chronic kidney disease with heart failure and stage 1 through stage 4 chronic kidney disease, or unspecified chronic kidney disease: Secondary | ICD-10-CM | POA: Diagnosis present

## 2022-09-25 DIAGNOSIS — Z823 Family history of stroke: Secondary | ICD-10-CM | POA: Diagnosis not present

## 2022-09-25 DIAGNOSIS — N189 Chronic kidney disease, unspecified: Secondary | ICD-10-CM | POA: Diagnosis not present

## 2022-09-25 DIAGNOSIS — Z794 Long term (current) use of insulin: Secondary | ICD-10-CM | POA: Diagnosis not present

## 2022-09-25 DIAGNOSIS — N179 Acute kidney failure, unspecified: Secondary | ICD-10-CM | POA: Diagnosis not present

## 2022-09-25 DIAGNOSIS — E1122 Type 2 diabetes mellitus with diabetic chronic kidney disease: Secondary | ICD-10-CM | POA: Diagnosis present

## 2022-09-25 DIAGNOSIS — Z833 Family history of diabetes mellitus: Secondary | ICD-10-CM | POA: Diagnosis not present

## 2022-09-25 DIAGNOSIS — Z91048 Other nonmedicinal substance allergy status: Secondary | ICD-10-CM | POA: Diagnosis not present

## 2022-09-25 DIAGNOSIS — E876 Hypokalemia: Secondary | ICD-10-CM | POA: Diagnosis not present

## 2022-09-25 DIAGNOSIS — N1832 Chronic kidney disease, stage 3b: Secondary | ICD-10-CM | POA: Diagnosis present

## 2022-09-25 DIAGNOSIS — I5021 Acute systolic (congestive) heart failure: Secondary | ICD-10-CM

## 2022-09-25 DIAGNOSIS — I739 Peripheral vascular disease, unspecified: Secondary | ICD-10-CM | POA: Diagnosis present

## 2022-09-25 DIAGNOSIS — E119 Type 2 diabetes mellitus without complications: Secondary | ICD-10-CM

## 2022-09-25 DIAGNOSIS — Z7984 Long term (current) use of oral hypoglycemic drugs: Secondary | ICD-10-CM | POA: Diagnosis not present

## 2022-09-25 DIAGNOSIS — Z79899 Other long term (current) drug therapy: Secondary | ICD-10-CM | POA: Diagnosis not present

## 2022-09-25 DIAGNOSIS — I2510 Atherosclerotic heart disease of native coronary artery without angina pectoris: Secondary | ICD-10-CM | POA: Diagnosis present

## 2022-09-25 DIAGNOSIS — I5043 Acute on chronic combined systolic (congestive) and diastolic (congestive) heart failure: Secondary | ICD-10-CM | POA: Diagnosis present

## 2022-09-25 DIAGNOSIS — Z853 Personal history of malignant neoplasm of breast: Secondary | ICD-10-CM | POA: Diagnosis not present

## 2022-09-25 LAB — BASIC METABOLIC PANEL
Anion gap: 10 (ref 5–15)
BUN: 34 mg/dL — ABNORMAL HIGH (ref 8–23)
CO2: 27 mmol/L (ref 22–32)
Calcium: 8.4 mg/dL — ABNORMAL LOW (ref 8.9–10.3)
Chloride: 99 mmol/L (ref 98–111)
Creatinine, Ser: 1.58 mg/dL — ABNORMAL HIGH (ref 0.44–1.00)
GFR, Estimated: 33 mL/min — ABNORMAL LOW (ref >60)
Glucose, Bld: 122 mg/dL — ABNORMAL HIGH (ref 70–99)
Potassium: 4.3 mmol/L (ref 3.5–5.1)
Sodium: 136 mmol/L (ref 135–145)

## 2022-09-25 LAB — LIPID PANEL
Cholesterol: 90 mg/dL (ref 0–200)
HDL: 34 mg/dL — ABNORMAL LOW (ref >40)
LDL Cholesterol: 50 mg/dL (ref 0–99)
Total CHOL/HDL Ratio: 2.6 RATIO
Triglycerides: 32 mg/dL (ref <150)
VLDL: 6 mg/dL (ref 0–40)

## 2022-09-25 LAB — GLUCOSE, CAPILLARY
Glucose-Capillary: 50 mg/dL — ABNORMAL LOW (ref 70–99)
Glucose-Capillary: 56 mg/dL — ABNORMAL LOW (ref 70–99)
Glucose-Capillary: 91 mg/dL (ref 70–99)
Glucose-Capillary: 118 mg/dL — ABNORMAL HIGH (ref 70–99)
Glucose-Capillary: 178 mg/dL — ABNORMAL HIGH (ref 70–99)
Glucose-Capillary: 112 mg/dL — ABNORMAL HIGH (ref 70–99)
Glucose-Capillary: 92 mg/dL (ref 70–99)

## 2022-09-25 LAB — IRON AND TIBC
Iron: 21 ug/dL — ABNORMAL LOW (ref 28–170)
Saturation Ratios: 6 % — ABNORMAL LOW (ref 10.4–31.8)
TIBC: 375 ug/dL (ref 250–450)
UIBC: 354 ug/dL

## 2022-09-25 LAB — TSH: TSH: 2.166 u[IU]/mL (ref 0.350–4.500)

## 2022-09-25 LAB — HEMOGLOBIN A1C
Hgb A1c MFr Bld: 10.9 % — ABNORMAL HIGH (ref 4.8–5.6)
Mean Plasma Glucose: 266.13 mg/dL

## 2022-09-25 LAB — ECHOCARDIOGRAM COMPLETE
Height: 63 in
Weight: 2328.06 oz

## 2022-09-25 LAB — TRANSFERRIN: Transferrin: 260 mg/dL (ref 192–382)

## 2022-09-25 LAB — FERRITIN: Ferritin: 36 ng/mL (ref 11–307)

## 2022-09-25 LAB — TROPONIN I (HIGH SENSITIVITY): Troponin I (High Sensitivity): 1438 ng/L (ref <18)

## 2022-09-25 MED ORDER — ACETAZOLAMIDE 250 MG PO TABS
500.0000 mg | ORAL_TABLET | Freq: Two times a day (BID) | ORAL | Status: DC
  Administered 2022-09-25 – 2022-09-28 (×7): 500 mg via ORAL
  Filled 2022-09-25 (×8): qty 2

## 2022-09-25 MED ORDER — FUROSEMIDE 10 MG/ML IJ SOLN
40.0000 mg | Freq: Once | INTRAMUSCULAR | Status: AC
  Administered 2022-09-25: 40 mg via INTRAVENOUS
  Filled 2022-09-25: qty 4

## 2022-09-25 MED ORDER — MELATONIN 5 MG PO TABS: 10.0000 mg | ORAL_TABLET | Freq: Every evening | ORAL | Status: DC | PRN

## 2022-09-25 MED ORDER — CARVEDILOL 3.125 MG PO TABS
3.1250 mg | ORAL_TABLET | Freq: Two times a day (BID) | ORAL | Status: DC
  Administered 2022-09-25 – 2022-10-03 (×17): 3.125 mg via ORAL
  Filled 2022-09-25 (×17): qty 1

## 2022-09-25 MED ORDER — SACUBITRIL-VALSARTAN 49-51 MG PO TABS
1.0000 | ORAL_TABLET | Freq: Two times a day (BID) | ORAL | Status: DC
  Filled 2022-09-25: qty 1

## 2022-09-25 MED ORDER — INSULIN ASPART 100 UNIT/ML IJ SOLN
0.0000 [IU] | Freq: Three times a day (TID) | INTRAMUSCULAR | Status: DC
  Administered 2022-09-25: 3 [IU] via SUBCUTANEOUS

## 2022-09-25 MED ORDER — EMPAGLIFLOZIN 10 MG PO TABS
10.0000 mg | ORAL_TABLET | Freq: Every day | ORAL | Status: DC
  Administered 2022-09-25 – 2022-10-03 (×8): 10 mg via ORAL
  Filled 2022-09-25 (×8): qty 1

## 2022-09-25 MED ORDER — SODIUM CHLORIDE 0.9 % IV SOLN
250.0000 mg | Freq: Every day | INTRAVENOUS | Status: AC
  Administered 2022-09-25 – 2022-09-28 (×4): 250 mg via INTRAVENOUS
  Filled 2022-09-25 (×4): qty 20

## 2022-09-25 MED ORDER — ONDANSETRON HCL 4 MG/2ML IJ SOLN: 4.0000 mg | Freq: Four times a day (QID) | INTRAMUSCULAR | Status: DC | PRN

## 2022-09-25 MED ORDER — ATORVASTATIN CALCIUM 40 MG PO TABS
40.0000 mg | ORAL_TABLET | Freq: Every day | ORAL | Status: DC
  Administered 2022-09-25 – 2022-10-03 (×9): 40 mg via ORAL
  Filled 2022-09-25 (×9): qty 1

## 2022-09-25 MED ORDER — SACUBITRIL-VALSARTAN 24-26 MG PO TABS
1.0000 | ORAL_TABLET | Freq: Two times a day (BID) | ORAL | Status: DC
  Administered 2022-09-25 – 2022-10-03 (×17): 1 via ORAL
  Filled 2022-09-25 (×17): qty 1

## 2022-09-25 MED ORDER — ACETAMINOPHEN 325 MG PO TABS: 650.0000 mg | ORAL_TABLET | Freq: Four times a day (QID) | ORAL | Status: DC | PRN

## 2022-09-25 MED ORDER — ASPIRIN 81 MG PO TBEC
81.0000 mg | DELAYED_RELEASE_TABLET | Freq: Every day | ORAL | Status: DC
  Administered 2022-09-25 – 2022-10-03 (×9): 81 mg via ORAL
  Filled 2022-09-25 (×9): qty 1

## 2022-09-25 MED ORDER — INSULIN GLARGINE-YFGN 100 UNIT/ML ~~LOC~~ SOLN
10.0000 [IU] | Freq: Every day | SUBCUTANEOUS | Status: DC
  Administered 2022-09-25: 10 [IU] via SUBCUTANEOUS
  Filled 2022-09-25 (×2): qty 0.1

## 2022-09-25 MED ORDER — BUMETANIDE 0.25 MG/ML IJ SOLN
2.0000 mg | Freq: Two times a day (BID) | INTRAMUSCULAR | Status: DC
  Administered 2022-09-25 – 2022-09-26 (×3): 2 mg via INTRAVENOUS
  Filled 2022-09-25 (×4): qty 8

## 2022-09-25 MED ORDER — INSULIN ASPART 100 UNIT/ML IJ SOLN: 0.0000 [IU] | Freq: Every day | INTRAMUSCULAR | Status: DC

## 2022-09-25 MED ORDER — HEPARIN SODIUM (PORCINE) 5000 UNIT/ML IJ SOLN
5000.0000 [IU] | Freq: Three times a day (TID) | INTRAMUSCULAR | Status: DC
  Administered 2022-09-25 – 2022-10-03 (×26): 5000 [IU] via SUBCUTANEOUS
  Filled 2022-09-25 (×25): qty 1

## 2022-09-25 MED ORDER — ONDANSETRON HCL 4 MG PO TABS: 4.0000 mg | ORAL_TABLET | Freq: Four times a day (QID) | ORAL | Status: DC | PRN

## 2022-09-25 MED ORDER — ACETAMINOPHEN 650 MG RE SUPP: 650.0000 mg | Freq: Four times a day (QID) | RECTAL | Status: DC | PRN

## 2022-09-25 MED ORDER — DOXYCYCLINE HYCLATE 100 MG PO TABS: 100.0000 mg | ORAL_TABLET | Freq: Once | ORAL | Status: DC

## 2022-09-25 NOTE — Assessment & Plan Note (Signed)
-  Continue Lipitor 40 mg daily ?

## 2022-09-25 NOTE — H&P (Signed)
History and Physical    [name] GLO:[id number] DOB: 1946-01-16 DOA: 09/24/2022  DOS: the patient was seen and examined on 09/24/2022  PCP: Darrow Bussing, MD   Patient coming from: Home  I have personally briefly reviewed patient's old medical records in [place] [facility]  CC: LE edema HPI: 77 year old African-American female history of nonischemic cardiomyopathy with last known EF of 35%, peripheral arterial disease, type 2 diabetes, CKD stage IIIb based on creatinine approximately 1.45, chronic systolic heart failure, COPD presents to the ER today with worsening lower extremity edema for the last month.  Patient is very clear and states that she has been taking 80 mg of Lasix twice a day for the last month.  She states that she is not urinating very much with it.    In January 2024, she was admitted for RSV bronchiolitis.  She was discharged with a weight of 53.4 kg.  She had a follow-up visit with cardiology in May 2024 and her weight then was 58.4 kg.  Today on a standing scale she weighs 68.4 kg.  Patient had ABIs performed which showed increasing occlusive disease.  She would been scheduled to see Dr. Gery Pray with cardiology this.  Patient states that she has been taking all of her medications as prescribed except for increased doses of Lasix 80 mg twice a day.  Arrival temp 97.6 heart rate 68 blood pressure 115/73 satting 100% on room air.  BNP was greater than 4500.  This was the same in January 2024.  I do not think she is ever established a baseline BNP when her weight is at a dry weight.  Sodium 135, potassium 3.8, BUN of 32, creatinine 1.65, total protein 5.8, albumin 3.0  COVID and flu are negative.  White count 3.1, hemoglobin 9.3, platelets of 227 EKG shows normal sinus rhythm.  Chest x-ray shows minimal pulmonary edema.  There is cardiomegaly.  [facility] hospitalist contacted for admission.    ED Course: BNP >4500. Troponin 1538-->1438  Review of Systems:  Review  of Systems  Constitutional:  Positive for malaise/fatigue. Negative for chills and fever.  HENT: Negative.    Eyes: Negative.   Respiratory: Negative.    Cardiovascular:  Positive for leg swelling.  Gastrointestinal: Negative.   Genitourinary: Negative.   Musculoskeletal: Negative.   Skin: Negative.   Neurological:  Positive for weakness.  Endo/Heme/Allergies: Negative.   Psychiatric/Behavioral: Negative.    All other systems reviewed and are negative.   Past Medical History:  Diagnosis Date   Breast cancer (HCC)    breast - left    Chronic diastolic CHF (congestive heart failure), NYHA class 2 (HCC)    a. cMRI 5/10: EF 38% // b. Echo 3/13/: EF 50-55, Gr 1 DD // c. Echo 8/14: EF 40, inf/inf-septal HK, Gr 1 DD, mild MR // d. Echo 5/17: EF 40-45, inf HK, Gr 1 DD, mild MR, mild LAE, reduced RVSF, mild TR, PASP 33 // e. Echo 10/17: EF 40-45, Gr 2 DD, mild MR, severe LAE; => 09/2020 EF 50-55%, GrII DD.   Coronary artery disease, non-occlusive    a. nonobs by LHC in 2010 // b. Myoview 10/17: Lg infarct apex, distal ant and lat walls, no ischemia, EF 38; int risk  // c. LHC 10/17:  dLAD 15, pLCx 30, mRCA 40, LVEDP 15   Diabetes mellitus    NICM (nonischemic cardiomyopathy) (HCC)     Past Surgical History:  Procedure Laterality Date   APPENDECTOMY  BREAST LUMPECTOMY     lt breast   CARDIAC CATHETERIZATION N/A 12/20/2015   Procedure: Left Heart Cath and Coronary Angiography;  Surgeon: Yvonne Kendall, MD:: Mild to moderate, nonobstructive coronary artery disease, including 15% distal LAD stenosis, 30% ulcerated mid LCx disease, and 40% mid RCA stenosis.  Upper normal left ventricular filling pressure (LVEDP 15 mmHg).   RIGHT HEART CATH N/A 05/08/2016   Procedure: Right Heart Cath;  Surgeon: Dolores Patty, MD;  Location: [facility];; RA = 3, RV = 34/5, PA = 38/12 (21), PCW = 12; Fick cardiac output/index = 4.6/2.6; PVR = 1.9 WU, Ao sat = 96%; PA sat = 58%, 62%     TRANSTHORACIC ECHOCARDIOGRAM  11/2015   a) 11/2015: EF 40 and 45%.  GRII DD; b) 04/2016: EF 20% with grade 2 diastolic dysfunction   TRANSTHORACIC ECHOCARDIOGRAM  10/2016   a) EF 55-60%.  Normal LV function.  Mild DD.Marland Kitchen  Normal valve Fxn.; b) 09/2020: EF 50 to 55%.  Normal function.  No R WMA.  Mild LVH.  GR 2 DD with elevated LAP.-Mild LA dilation.  Moderately elevated PAP estimated RVSP 47 mmHg, and RAP 8 mmHg.   TRIGGER FINGER RELEASE Left 04/23/2017   Procedure: RELEASE TRIGGER FINGER TO LEFT FORTH FINGER;  Surgeon: Louisa Second, MD;  Location: [facility];  Service: Plastics;  Laterality: Left;     reports that she quit smoking about 9 years ago. Her smoking use included cigarettes. She started smoking about 40 years ago. She has never used smokeless tobacco. She reports that she does not drink alcohol and does not use drugs.  Allergies  Allergen Reactions   Pregabalin     Other Reaction(s): feeling fatigue drunk feeling, falls   Tape Rash    Pt can tolerate the paper tape    Family History  Problem Relation Age of Onset   Stroke Father    Diabetes Maternal Grandmother     Prior to Admission medications   Medication Sig Start Date End Date Taking? Authorizing Provider  atorvastatin (LIPITOR) 40 MG tablet Take 1 tablet by mouth once daily 09/05/22  Yes Marykay Lex, MD  carvedilol (COREG) 3.125 MG tablet Take 1 tablet (3.125 mg total) by mouth 2 (two) times daily with a meal. 09/04/22  Yes Runell Gess, MD  furosemide (LASIX) 40 MG tablet Take 1 tablet (40 mg total) by mouth daily. Patient taking differently: Take 40 mg by mouth 2 (two) times daily. 09/04/22  Yes Runell Gess, MD  insulin NPH-regular Human (70-30) 100 UNIT/ML injection 22 units with breakfast, and 12 units with supper. Patient taking differently: Inject 12-25 Units into the skin See admin instructions. Inject 25 units into the skin in the morning and 12 units at night 12/10/20  Yes  Romero Belling, MD  sacubitril-valsartan (ENTRESTO) 49-51 MG Take 1 tablet by mouth 2 (two) times daily. 06/23/22  Yes Jodelle Gross, NP    Physical Exam: Vitals:   09/25/22 0300 09/25/22 0319 09/25/22 0324 09/25/22 0325  BP: 126/77   130/81  Pulse: 66   71  Resp: (!) 25   (!) 26  Temp:   97.9 F (36.6 C)   TempSrc:   Oral   SpO2: 100%   99%  Weight:  68.4 kg    Height:        Physical Exam Vitals and nursing note reviewed.  Constitutional:      General: She is not in acute distress.  Appearance: She is not toxic-appearing or diaphoretic.  HENT:     Head: Normocephalic and atraumatic.  Eyes:     General: No scleral icterus. Cardiovascular:     Rate and Rhythm: Normal rate and regular rhythm.     Comments: 5 cm JVD with pt sitting upright at 60 degrees Pulmonary:     Effort: No respiratory distress.     Breath sounds: Examination of the right-middle field reveals rales. Examination of the left-middle field reveals rales. Examination of the right-lower field reveals rales. Examination of the left-lower field reveals rales. Rales present.  Abdominal:     General: Bowel sounds are normal. There is no distension.     Palpations: Abdomen is soft.     Tenderness: There is no abdominal tenderness.  Musculoskeletal:     Right lower leg: 2+ Edema present.     Left lower leg: 2+ Edema present.  Skin:    General: Skin is warm and dry.     Capillary Refill: Capillary refill takes less than 2 seconds.  Neurological:     General: No focal deficit present.     Mental Status: She is alert and oriented to person, place, and time.      Labs on Admission: I have personally reviewed following labs and imaging studies  CBC: Recent Labs  Lab 09/25/22 0029  WBC 3.1*  NEUTROABS 1.3*  HGB 9.3*  HCT 29.7*  MCV 85.8  PLT 227   Basic Metabolic Panel: Recent Labs  Lab 09/25/22 0029  NA 135  K 3.8  CL 100  CO2 25  GLUCOSE 179*  BUN 32*  CREATININE 1.65*  CALCIUM 8.6*    GFR: Estimated Creatinine Clearance: 26.1 mL/min (A) (by C-G formula based on SCr of 1.65 mg/dL (H)). Liver Function Tests: Recent Labs  Lab 09/25/22 0029  AST 32  ALT 28  ALKPHOS 90  BILITOT 1.0  PROT 5.8*  ALBUMIN 3.0*   Recent Labs  Lab 09/25/22 0029  LIPASE 36   Cardiac Enzymes: Recent Labs  Lab 09/25/22 0029 09/25/22 0132  TROPONINIHS 1,538* 1,438*   BNP (last 3 results) Recent Labs    02/26/22 1759 09/25/22 0029  BNP >4,500.0* >4,500.0*   Urine analysis:    Component Value Date/Time   COLORURINE STRAW (A) 09/20/2020 1042   APPEARANCEUR CLEAR 09/20/2020 1042   LABSPEC 1.009 09/20/2020 1042   PHURINE 5.0 09/20/2020 1042   GLUCOSEU >=500 (A) 09/20/2020 1042   HGBUR NEGATIVE 09/20/2020 1042   BILIRUBINUR NEGATIVE 09/20/2020 1042   KETONESUR NEGATIVE 09/20/2020 1042   PROTEINUR NEGATIVE 09/20/2020 1042   UROBILINOGEN 0.2 04/28/2012 1757   NITRITE NEGATIVE 09/20/2020 1042   LEUKOCYTESUR NEGATIVE 09/20/2020 1042    Radiological Exams on Admission: I have personally reviewed images DG Chest Portable 1 View  Result Date: 09/25/2022 CLINICAL DATA:  Cough. EXAM: PORTABLE CHEST 1 VIEW COMPARISON:  None Available. FINDINGS: The cardiac silhouette is enlarged and unchanged in size. Mild perihilar prominence of the pulmonary vasculature is seen. There is marked severity calcification of the aortic arch. Mild, diffuse, chronic appearing increased interstitial lung markings are seen. Mild atelectasis and/or infiltrate is noted within the left lung base. No pleural effusion or pneumothorax is identified. The visualized skeletal structures are unremarkable. IMPRESSION: 1. Cardiomegaly with mild pulmonary vascular congestion. 2. Mild left basilar atelectasis and/or infiltrate. Electronically Signed   By: Aram Candela M.D.   On: 09/25/2022 00:21    EKG: My personal interpretation of EKG shows: NSR  Assessment/Plan Principal Problem:   Acute on chronic systolic  CHF (congestive heart failure) (HCC) Active Problems:   Type 2 diabetes mellitus without complication, with long-term current use of insulin (HCC)   Essential hypertension   CKD stage 3b, GFR 30-44 ml/min (HCC)   NICM (nonischemic cardiomyopathy) (HCC)   Hyperlipidemia associated with type 2 diabetes mellitus (HCC)   PAD (peripheral artery disease) (HCC)    Assessment and Plan: * Acute on chronic systolic CHF (congestive heart failure) (HCC) admit to card telemetry bed.  She benefit from seeing CHF consult.  She has been on 80 of Lasix orally twice a day for the last month according to her.  She has had minimal increase in her urine output.  Will switch to IV Bumex 2 mg twice daily in effort to stimulate diuresis.  Will add Diamox 500 mg twice daily.  Continue her on her guideline directed medical therapy with Entresto.  Update her echo.  CKD stage 3b, GFR 30-44 ml/min (HCC) Baseline Scr approximately 1.4.  Monitor while diuresing.  Avoid nephrotoxic agents.  Essential hypertension Stable.  Continue on her Coreg, Entresto  Type 2 diabetes mellitus without complication, with long-term current use of insulin (HCC) Check A1c.  Add sliding scale insulin.  Add Lantus.  Patient is on 70/30 insulin at home.  PAD (peripheral artery disease) (HCC) Patient's had ABIs which showed peripheral arterial disease.  Hyperlipidemia associated with type 2 diabetes mellitus (HCC) Continue Lipitor 40 mg daily.  NICM (nonischemic cardiomyopathy) (HCC) Echo from January 2024 showed an EF of 35%.  Update her echo.   DVT prophylaxis: SQ Heparin Code Status: Full Code Family Communication: no family at bedside  Disposition Plan: return home  Consults called: EDP has consulted cardiology  Admission status: Observation, Telemetry bed   Carollee Herter, DO [facility] 09/25/2022, 3:45 AM

## 2022-09-25 NOTE — Progress Notes (Signed)
PROGRESS NOTE    [name]  GUY:[id number] DOB: Oct 24, 1945 DOA: 09/24/2022 PCP: Darrow Bussing, MD  76/F with chronic systolic CHF, NICM EF 35%, CKD 3B, type 2 diabetes mellitus, COPD presented to the ED with progressive dyspnea on exertion and worsening lower extremity edema X1 month, despite compliance with diuretics, recently her Lasix dose has been increased to 80 Mg twice daily, she has gained 22 pounds fluid weight over the last few months.  In the ED vital signs stable, BNP> 4500, creatinine 1.6, albumin 3.0, hemoglobin 9.3, chest x-ray with mild pulmonary edema   Subjective: -Feels a little better today  Assessment and Plan:  Acute on chronic systolic CHF -Echo with EF as low as 20% in 2018, had nonobstructive CAD by cath in 2017, EF improved to 50-55% and then down again to 35-40% on last echo, with grade 3 diastolic dysfunction and low normal RV -Continue IV Bumex today, resumed Entresto at lower dose -Agree with SGLT2i -Cards following, monitor I's/O, BMP in a.m. -Increase activity, PT eval  Iron deficiency anemia -Add IV iron today  CKD stage 3b,  -Relatively stable, monitor with diuresis  Essential hypertension Stable.  Continue on her Coreg, Entresto  Type 2 diabetes mellitus without complication, with long-term current use of insulin (HCC) -A1c is 10.9, started on glargine and Jardiance -Patient is on 70/30 insulin at home.  PAD (peripheral artery disease) (HCC) Patient's had ABIs which showed peripheral arterial disease. -Continue aspirin, statin, follow-up with vascular surgery  Hyperlipidemia associated with type 2 diabetes mellitus (HCC) Continue Lipitor 40 mg daily.   DVT prophylaxis: Heparin subcutaneous Code Status: Full code Family Communication: None present Disposition Plan: Home likely 2-3days  Consultants:    Procedures:   Antimicrobials:    Objective: Vitals:   09/25/22 0532 09/25/22 0801 09/25/22 1020 09/25/22 1157  BP: 125/70  128/75  120/74  Pulse: 64 70 78 74  Resp: 16 17  18   Temp: 98.5 F (36.9 C) 98.7 F (37.1 C)  98.4 F (36.9 C)  TempSrc: Oral Oral  Oral  SpO2: 99%     Weight:      Height:        Intake/Output Summary (Last 24 hours) at 09/25/2022 1209 Last data filed at 09/25/2022 1107 Gross per 24 hour  Intake --  Output 700 ml  Net -700 ml   Filed Weights   09/24/22 2340 09/25/22 0319 09/25/22 0532  Weight: 58.1 kg 68.4 kg 66 kg    Examination:  Elderly female sitting up in bed, AAOx3, no distress HEENT: Positive JVD CVS: S1-S2, regular rhythm Lungs: Few basilar Rales Abdomen: Soft, nontender, bowel sounds present Extremities: 2+ edema Skin: No rashes Psychiatry:  Mood & affect appropriate.     Data Reviewed:   CBC: Recent Labs  Lab 09/25/22 0029  WBC 3.1*  NEUTROABS 1.3*  HGB 9.3*  HCT 29.7*  MCV 85.8  PLT 227   Basic Metabolic Panel: Recent Labs  Lab 09/25/22 0029 09/25/22 1029  NA 135 136  K 3.8 4.3  CL 100 99  CO2 25 27  GLUCOSE 179* 122*  BUN 32* 34*  CREATININE 1.65* 1.58*  CALCIUM 8.6* 8.4*   GFR: Estimated Creatinine Clearance: 26.8 mL/min (A) (by C-G formula based on SCr of 1.58 mg/dL (H)). Liver Function Tests: Recent Labs  Lab 09/25/22 0029  AST 32  ALT 28  ALKPHOS 90  BILITOT 1.0  PROT 5.8*  ALBUMIN 3.0*   Recent Labs  Lab 09/25/22 0029  LIPASE  36   No results for input(s): "AMMONIA" in the last 168 hours. Coagulation Profile: No results for input(s): "INR", "PROTIME" in the last 168 hours. Cardiac Enzymes: No results for input(s): "CKTOTAL", "CKMB", "CKMBINDEX", "TROPONINI" in the last 168 hours. BNP (last 3 results) No results for input(s): "PROBNP" in the last 8760 hours. HbA1C: Recent Labs    09/25/22 0703  HGBA1C 10.9*   CBG: Recent Labs  Lab 09/25/22 0537 09/25/22 0610 09/25/22 0612 09/25/22 0804 09/25/22 1200  GLUCAP 50* 56* 91 118* 178*   Lipid Profile: Recent Labs    09/25/22 0703  CHOL 90  HDL 34*   LDLCALC 50  TRIG 32  CHOLHDL 2.6   Thyroid Function Tests: Recent Labs    09/25/22 0703  TSH 2.166   Anemia Panel: Recent Labs    09/25/22 0316  FERRITIN 36  TIBC 375  IRON 21*   Urine analysis:    Component Value Date/Time   COLORURINE YELLOW 09/25/2022 0316   APPEARANCEUR HAZY (A) 09/25/2022 0316   LABSPEC 1.010 09/25/2022 0316   PHURINE 5.0 09/25/2022 0316   GLUCOSEU >=500 (A) 09/25/2022 0316   HGBUR MODERATE (A) 09/25/2022 0316   BILIRUBINUR NEGATIVE 09/25/2022 0316   KETONESUR NEGATIVE 09/25/2022 0316   PROTEINUR 30 (A) 09/25/2022 0316   UROBILINOGEN 0.2 04/28/2012 1757   NITRITE NEGATIVE 09/25/2022 0316   LEUKOCYTESUR TRACE (A) 09/25/2022 0316   Sepsis Labs: @LABRCNTIP (procalcitonin:4,lacticidven:4)  ) Recent Results (from the past 240 hour(s))  Resp panel by RT-PCR (RSV, Flu A&B, Covid) Anterior Nasal Swab     Status: None   Collection Time: 09/25/22 12:03 AM   Specimen: Anterior Nasal Swab  Result Value Ref Range Status   SARS Coronavirus 2 by RT PCR NEGATIVE NEGATIVE Final   Influenza A by PCR NEGATIVE NEGATIVE Final   Influenza B by PCR NEGATIVE NEGATIVE Final    Comment: (NOTE) The Xpert Xpress SARS-CoV-2/FLU/RSV plus assay is intended as an aid in the diagnosis of influenza from Nasopharyngeal swab specimens and should not be used as a sole basis for treatment. Nasal washings and aspirates are unacceptable for Xpert Xpress SARS-CoV-2/FLU/RSV testing.  Fact Sheet for Patients: BloggerCourse.com  Fact Sheet for Healthcare Providers: SeriousBroker.it  This test is not yet approved or cleared by the [place] FDA and has been authorized for detection and/or diagnosis of SARS-CoV-2 by FDA under an Emergency Use Authorization (EUA). This EUA will remain in effect (meaning this test can be used) for the duration of the COVID-19 declaration under Section 564(b)(1) of the Act, 21 U.S.C. section  360bbb-3(b)(1), unless the authorization is terminated or revoked.     Resp Syncytial Virus by PCR NEGATIVE NEGATIVE Final    Comment: (NOTE) Fact Sheet for Patients: BloggerCourse.com  Fact Sheet for Healthcare Providers: SeriousBroker.it  This test is not yet approved or cleared by the [place] FDA and has been authorized for detection and/or diagnosis of SARS-CoV-2 by FDA under an Emergency Use Authorization (EUA). This EUA will remain in effect (meaning this test can be used) for the duration of the COVID-19 declaration under Section 564(b)(1) of the Act, 21 U.S.C. section 360bbb-3(b)(1), unless the authorization is terminated or revoked.  Performed at [facility], [address] [place], [place] [address]      Radiology Studies: DG Chest Portable 1 View  Result Date: 09/25/2022 CLINICAL DATA:  Cough. EXAM: PORTABLE CHEST 1 VIEW COMPARISON:  None Available. FINDINGS: The cardiac silhouette is enlarged and unchanged in size. Mild perihilar  prominence of the pulmonary vasculature is seen. There is marked severity calcification of the aortic arch. Mild, diffuse, chronic appearing increased interstitial lung markings are seen. Mild atelectasis and/or infiltrate is noted within the left lung base. No pleural effusion or pneumothorax is identified. The visualized skeletal structures are unremarkable. IMPRESSION: 1. Cardiomegaly with mild pulmonary vascular congestion. 2. Mild left basilar atelectasis and/or infiltrate. Electronically Signed   By: Aram Candela M.D.   On: 09/25/2022 00:21     Scheduled Meds:  acetaZOLAMIDE  500 mg Oral BID   aspirin EC  81 mg Oral Daily   atorvastatin  40 mg Oral Daily   bumetanide (BUMEX) IV  2 mg Intravenous BID   carvedilol  3.125 mg Oral BID WC   empagliflozin  10 mg Oral Daily   heparin  5,000 Units Subcutaneous Q8H   insulin aspart  0-15 Units Subcutaneous TID WC   insulin  aspart  0-5 Units Subcutaneous QHS   insulin glargine-yfgn  10 Units Subcutaneous Daily   sacubitril-valsartan  1 tablet Oral BID   Continuous Infusions:  ferric gluconate (FERRLECIT) IVPB 250 mg (09/25/22 1116)     LOS: 0 days    Time spent:    Zannie Cove, MD [facility]   09/25/2022, 12:09 PM

## 2022-09-25 NOTE — Assessment & Plan Note (Addendum)
Continue blood pressure control with entresto and carvedilol.  Diuresis with bumetanide.

## 2022-09-25 NOTE — Assessment & Plan Note (Signed)
Patient's had ABIs which showed peripheral arterial disease.

## 2022-09-25 NOTE — Plan of Care (Signed)

## 2022-09-25 NOTE — Subjective & Objective (Signed)
CC: LE edema HPI: 77 year old African-American female history of nonischemic cardiomyopathy with last known EF of 35%, peripheral arterial disease, type 2 diabetes, CKD stage IIIb based on creatinine approximately 1.45, chronic systolic heart failure, COPD presents to the ER today with worsening lower extremity edema for the last month.  Patient is very clear and states that she has been taking 80 mg of Lasix twice a day for the last month.  She states that she is not urinating very much with it.    In January 2024, she was admitted for RSV bronchiolitis.  She was discharged with a weight of 53.4 kg.  She had a follow-up visit with cardiology in May 2024 and her weight then was 58.4 kg.  Today on a standing scale she weighs 68.4 kg.  Patient had ABIs performed which showed increasing occlusive disease.  She would been scheduled to see Dr. Gery Pray with cardiology this.  Patient states that she has been taking all of her medications as prescribed except for increased doses of Lasix 80 mg twice a day.  Arrival temp 97.6 heart rate 68 blood pressure 115/73 satting 100% on room air.  BNP was greater than 4500.  This was the same in January 2024.  I do not think she is ever established a baseline BNP when her weight is at a dry weight.  Sodium 135, potassium 3.8, BUN of 32, creatinine 1.65, total protein 5.8, albumin 3.0  COVID and flu are negative.  White count 3.1, hemoglobin 9.3, platelets of 227 EKG shows normal sinus rhythm.  Chest x-ray shows minimal pulmonary edema.  There is cardiomegaly.  [facility] contacted for admission.

## 2022-09-25 NOTE — ED Notes (Signed)
ED TO INPATIENT HANDOFF REPORT  ED Nurse Name and Phone #: Lew Dawes RN [phone]  S Name/Age/Gender [name] 77 y.o. female Room/Bed: 002C/002C  Code Status   Code Status: Full Code  Home/SNF/Other Home Patient oriented to: self, place, time, and situation Is this baseline? Yes   Triage Complete: Triage complete  Chief Complaint Acute on chronic systolic CHF (congestive heart failure) (HCC) [I50.23]  Triage Note Pt BIB [facility] from from home, c/o cough x 3 days, d/n x 2 days, fatigue/weakness, body aches, chills, increased swelling to bilateral feet and legs, worse on L, pt has not been eating and has not taken insulin, CBG 330, pt had insulin upon EMS arrival    Allergies Allergies  Allergen Reactions   Pregabalin     Other Reaction(s): feeling fatigue drunk feeling, falls   Tape Rash    Pt can tolerate the paper tape    Level of Care/Admitting Diagnosis ED Disposition     ED Disposition  Admit   Condition  --   Comment  Hospital Area: [facility] [[id number]]  Level of Care: Telemetry Cardiac [[id number]]  May place patient in observation at [facility] or [facility] if equivalent level of care is available:: No  Covid Evaluation: Asymptomatic - no recent exposure (last 10 days) testing not required  Diagnosis: Acute on chronic systolic CHF (congestive heart failure) [facility]) [[id number]]  Admitting Physician: Imogene Burn ERIC [[id number]]  Attending Physician: Imogene Burn, ERIC [[id number]]          B Medical/Surgery History Past Medical History:  Diagnosis Date   Breast cancer (HCC)    breast - left    Chronic diastolic CHF (congestive heart failure), NYHA class 2 (HCC)    a. cMRI 5/10: EF 38% // b. Echo 3/13/: EF 50-55, Gr 1 DD // c. Echo 8/14: EF 40, inf/inf-septal HK, Gr 1 DD, mild MR // d. Echo 5/17: EF 40-45, inf HK, Gr 1 DD, mild MR, mild LAE, reduced RVSF, mild TR, PASP 33 // e. Echo 10/17: EF 40-45, Gr 2 DD, mild MR, severe LAE; => 09/2020 EF 50-55%, GrII DD.    Coronary artery disease, non-occlusive    a. nonobs by LHC in 2010 // b. Myoview 10/17: Lg infarct apex, distal ant and lat walls, no ischemia, EF 38; int risk  // c. LHC 10/17:  dLAD 15, pLCx 30, mRCA 40, LVEDP 15   Diabetes mellitus    NICM (nonischemic cardiomyopathy) (HCC)    Past Surgical History:  Procedure Laterality Date   APPENDECTOMY     BREAST LUMPECTOMY     lt breast   CARDIAC CATHETERIZATION N/A 12/20/2015   Procedure: Left Heart Cath and Coronary Angiography;  Surgeon: Yvonne Kendall, MD:: Mild to moderate, nonobstructive coronary artery disease, including 15% distal LAD stenosis, 30% ulcerated mid LCx disease, and 40% mid RCA stenosis.  Upper normal left ventricular filling pressure (LVEDP 15 mmHg).   RIGHT HEART CATH N/A 05/08/2016   Procedure: Right Heart Cath;  Surgeon: Dolores Patty, MD;  Location: [facility];; RA = 3, RV = 34/5, PA = 38/12 (21), PCW = 12; Fick cardiac output/index = 4.6/2.6; PVR = 1.9 WU, Ao sat = 96%; PA sat = 58%, 62%    TRANSTHORACIC ECHOCARDIOGRAM  11/2015   a) 11/2015: EF 40 and 45%.  GRII DD; b) 04/2016: EF 20% with grade 2 diastolic dysfunction   TRANSTHORACIC ECHOCARDIOGRAM  10/2016   a) EF 55-60%.  Normal LV function.  Mild DD.Marland Kitchen  Normal valve Fxn.; b) 09/2020: EF 50 to 55%.  Normal function.  No R WMA.  Mild LVH.  GR 2 DD with elevated LAP.-Mild LA dilation.  Moderately elevated PAP estimated RVSP 47 mmHg, and RAP 8 mmHg.   TRIGGER FINGER RELEASE Left 04/23/2017   Procedure: RELEASE TRIGGER FINGER TO LEFT FORTH FINGER;  Surgeon: Louisa Second, MD;  Location: [facility];  Service: Plastics;  Laterality: Left;     A IV Location/Drains/Wounds Patient Lines/Drains/Airways Status     Active Line/Drains/Airways     Name Placement date Placement time Site Days   Peripheral IV 09/24/22 18 G 1" Left Forearm 09/24/22  2335  Forearm  1   Peripheral IV 09/25/22 20 G 1" Anterior;Right Forearm 09/25/22  0131  Forearm  less  than 1            Intake/Output Last 24 hours  Intake/Output Summary (Last 24 hours) at 09/25/2022 4098 Last data filed at 09/25/2022 0320 Gross per 24 hour  Intake --  Output 400 ml  Net -400 ml    Labs/Imaging Results for orders placed or performed during the hospital encounter of 09/24/22 (from the past 48 hour(s))  Resp panel by RT-PCR (RSV, Flu A&B, Covid) Anterior Nasal Swab     Status: None   Collection Time: 09/25/22 12:03 AM   Specimen: Anterior Nasal Swab  Result Value Ref Range   SARS Coronavirus 2 by RT PCR NEGATIVE NEGATIVE   Influenza A by PCR NEGATIVE NEGATIVE   Influenza B by PCR NEGATIVE NEGATIVE    Comment: (NOTE) The Xpert Xpress SARS-CoV-2/FLU/RSV plus assay is intended as an aid in the diagnosis of influenza from Nasopharyngeal swab specimens and should not be used as a sole basis for treatment. Nasal washings and aspirates are unacceptable for Xpert Xpress SARS-CoV-2/FLU/RSV testing.  Fact Sheet for Patients: BloggerCourse.com  Fact Sheet for Healthcare Providers: SeriousBroker.it  This test is not yet approved or cleared by the [place] FDA and has been authorized for detection and/or diagnosis of SARS-CoV-2 by FDA under an Emergency Use Authorization (EUA). This EUA will remain in effect (meaning this test can be used) for the duration of the COVID-19 declaration under Section 564(b)(1) of the Act, 21 U.S.C. section 360bbb-3(b)(1), unless the authorization is terminated or revoked.     Resp Syncytial Virus by PCR NEGATIVE NEGATIVE    Comment: (NOTE) Fact Sheet for Patients: BloggerCourse.com  Fact Sheet for Healthcare Providers: SeriousBroker.it  This test is not yet approved or cleared by the [place] FDA and has been authorized for detection and/or diagnosis of SARS-CoV-2 by FDA under an Emergency Use Authorization (EUA). This EUA  will remain in effect (meaning this test can be used) for the duration of the COVID-19 declaration under Section 564(b)(1) of the Act, 21 U.S.C. section 360bbb-3(b)(1), unless the authorization is terminated or revoked.  Performed at [facility], [address], [place], [place] [address]   CBC with Differential     Status: Abnormal   Collection Time: 09/25/22 12:29 AM  Result Value Ref Range   WBC 3.1 (L) 4.0 - 10.5 K/uL   RBC 3.46 (L) 3.87 - 5.11 MIL/uL   Hemoglobin 9.3 (L) 12.0 - 15.0 g/dL   HCT 78.2 (L) 95.6 - 21.3 %   MCV 85.8 80.0 - 100.0 fL   MCH 26.9 26.0 - 34.0 pg   MCHC 31.3 30.0 - 36.0 g/dL   RDW 08.6 (H) 57.8 - 46.9 %  Platelets 227 150 - 400 K/uL   nRBC 0.0 0.0 - 0.2 %   Neutrophils Relative % 41 %   Neutro Abs 1.3 (L) 1.7 - 7.7 K/uL   Lymphocytes Relative 39 %   Lymphs Abs 1.2 0.7 - 4.0 K/uL   Monocytes Relative 19 %   Monocytes Absolute 0.6 0.1 - 1.0 K/uL   Eosinophils Relative 0 %   Eosinophils Absolute 0.0 0.0 - 0.5 K/uL   Basophils Relative 1 %   Basophils Absolute 0.0 0.0 - 0.1 K/uL   Immature Granulocytes 0 %   Abs Immature Granulocytes 0.01 0.00 - 0.07 K/uL    Comment: Performed at [facility], [address], [place], [place] [address]  Comprehensive metabolic panel     Status: Abnormal   Collection Time: 09/25/22 12:29 AM  Result Value Ref Range   Sodium 135 135 - 145 mmol/L   Potassium 3.8 3.5 - 5.1 mmol/L   Chloride 100 98 - 111 mmol/L   CO2 25 22 - 32 mmol/L   Glucose, Bld 179 (H) 70 - 99 mg/dL    Comment: Glucose reference range applies only to samples taken after fasting for at least 8 hours.   BUN 32 (H) 8 - 23 mg/dL   Creatinine, Ser 6.04 (H) 0.44 - 1.00 mg/dL   Calcium 8.6 (L) 8.9 - 10.3 mg/dL   Total Protein 5.8 (L) 6.5 - 8.1 g/dL   Albumin 3.0 (L) 3.5 - 5.0 g/dL   AST 32 15 - 41 U/L   ALT 28 0 - 44 U/L   Alkaline Phosphatase 90 38 - 126 U/L   Total Bilirubin 1.0 0.3 - 1.2 mg/dL   GFR, Estimated 32 (L) >60 mL/min     Comment: (NOTE) Calculated using the CKD-EPI Creatinine Equation (2021)    Anion gap 10 5 - 15    Comment: Performed at [facility], [address] [place], [place] [address]  Lipase, blood     Status: None   Collection Time: 09/25/22 12:29 AM  Result Value Ref Range   Lipase 36 11 - 51 U/L    Comment: Performed at [facility], [address], [place], [place] [address]  Brain natriuretic peptide     Status: Abnormal   Collection Time: 09/25/22 12:29 AM  Result Value Ref Range   B Natriuretic Peptide >4,500.0 (H) 0.0 - 100.0 pg/mL    Comment: Performed at [facility], [address], [place], [place] [address]  Troponin I (High Sensitivity)     Status: Abnormal   Collection Time: 09/25/22 12:29 AM  Result Value Ref Range   Troponin I (High Sensitivity) 1,538 (HH) <18 ng/L    Comment: CRITICAL RESULT CALLED TO, READ BACK BY AND VERIFIED WITH BRANCH,K. PARAMEDIC @0112  09/25/22 SATRAINR (NOTE) Elevated high sensitivity troponin I (hsTnI) values and significant  changes across serial measurements may suggest ACS but many other  chronic and acute conditions are known to elevate hsTnI results.  Refer to the "Links" section for chest pain algorithms and additional  guidance. Performed at [facility], [address], [place], [place] [address]   Troponin I (High Sensitivity)     Status: Abnormal   Collection Time: 09/25/22  1:32 AM  Result Value Ref Range   Troponin I (High Sensitivity) 1,438 (HH) <18 ng/L    Comment: CRITICAL VALUE NOTED. VALUE IS CONSISTENT WITH PREVIOUSLY REPORTED/CALLED VALUE (NOTE) Elevated high sensitivity troponin I (hsTnI) values and  significant  changes across serial measurements may suggest ACS but many other  chronic and acute conditions are known to elevate hsTnI results.  Refer to the "Links" section for chest pain algorithms and additional  guidance. Performed at [facility], [address], [place], [place] [address]    Urinalysis, Routine w reflex microscopic -Urine, Clean Catch     Status: Abnormal   Collection Time: 09/25/22  3:16 AM  Result Value Ref Range   Color, Urine YELLOW YELLOW   APPearance HAZY (A) CLEAR   Specific Gravity, Urine 1.010 1.005 - 1.030   pH 5.0 5.0 - 8.0   Glucose, UA >=500 (A) NEGATIVE mg/dL   Hgb urine dipstick MODERATE (A) NEGATIVE   Bilirubin Urine NEGATIVE NEGATIVE   Ketones, ur NEGATIVE NEGATIVE mg/dL   Protein, ur 30 (A) NEGATIVE mg/dL   Nitrite NEGATIVE NEGATIVE   Leukocytes,Ua TRACE (A) NEGATIVE   RBC / HPF 6-10 0 - 5 RBC/hpf   WBC, UA 0-5 0 - 5 WBC/hpf   Bacteria, UA RARE (A) NONE SEEN   Squamous Epithelial / HPF 11-20 0 - 5 /HPF   Hyaline Casts, UA PRESENT     Comment: Performed at [facility], [address], [place], [place] [address]  Ferritin     Status: None   Collection Time: 09/25/22  3:16 AM  Result Value Ref Range   Ferritin 36 11 - 307 ng/mL    Comment: Performed at [facility], [address], [place], [place] [address]  Iron and TIBC     Status: Abnormal   Collection Time: 09/25/22  3:16 AM  Result Value Ref Range   Iron 21 (L) 28 - 170 ug/dL   TIBC 914 782 - 956 ug/dL   Saturation Ratios 6 (L) 10.4 - 31.8 %   UIBC 354 ug/dL    Comment: Performed at [facility], [address] [place], [place] [address]  Transferrin     Status: None   Collection Time: 09/25/22  3:16 AM  Result Value Ref Range   Transferrin 260 192 - 382 mg/dL    Comment: Performed at [facility], [address] [place], [place] [address]   DG Chest Portable 1 View  Result Date: 09/25/2022 CLINICAL DATA:  Cough. EXAM: PORTABLE CHEST 1 VIEW COMPARISON:  None Available. FINDINGS: The cardiac silhouette is enlarged and unchanged in size. Mild perihilar prominence of the pulmonary vasculature is seen. There is marked severity calcification of the aortic arch. Mild, diffuse, chronic appearing increased interstitial lung markings are seen. Mild  atelectasis and/or infiltrate is noted within the left lung base. No pleural effusion or pneumothorax is identified. The visualized skeletal structures are unremarkable. IMPRESSION: 1. Cardiomegaly with mild pulmonary vascular congestion. 2. Mild left basilar atelectasis and/or infiltrate. Electronically Signed   By: Aram Candela M.D.   On: 09/25/2022 00:21    Pending Labs Unresulted Labs (From admission, onward)     Start     Ordered   09/28/22 0500  Brain natriuretic peptide  (Heart Failure)  Once-Timed,   R        09/25/22 0359   09/25/22 0400  Hemoglobin A1c  Add-on,   AD        09/25/22 0359   09/25/22 0400  TSH  Add-on,   AD        09/25/22 0359   09/25/22 0400  Lipid panel  Add-on,   AD  09/25/22 0359            Vitals/Pain Today's Vitals   09/25/22 0300 09/25/22 0319 09/25/22 0324 09/25/22 0325  BP: 126/77   130/81  Pulse: 66   71  Resp: (!) 25   (!) 26  Temp:   97.9 F (36.6 C)   TempSrc:   Oral   SpO2: 100%   99%  Weight:  68.4 kg    Height:      PainSc:        Isolation Precautions No active isolations  Medications Medications  atorvastatin (LIPITOR) tablet 40 mg (has no administration in time range)  carvedilol (COREG) tablet 3.125 mg (has no administration in time range)  sacubitril-valsartan (ENTRESTO) 49-51 mg per tablet (has no administration in time range)  aspirin EC tablet 81 mg (has no administration in time range)  bumetanide (BUMEX) injection 2 mg (has no administration in time range)  acetaZOLAMIDE (DIAMOX) tablet 500 mg (has no administration in time range)  heparin injection 5,000 Units (has no administration in time range)  acetaminophen (TYLENOL) tablet 650 mg (has no administration in time range)    Or  acetaminophen (TYLENOL) suppository 650 mg (has no administration in time range)  ondansetron (ZOFRAN) tablet 4 mg (has no administration in time range)    Or  ondansetron (ZOFRAN) injection 4 mg (has no administration in time  range)  melatonin tablet 10 mg (has no administration in time range)  insulin aspart (novoLOG) injection 0-15 Units (has no administration in time range)  insulin aspart (novoLOG) injection 0-5 Units (has no administration in time range)  insulin glargine-yfgn (SEMGLEE) injection 10 Units (has no administration in time range)  furosemide (LASIX) injection 40 mg (40 mg Intravenous Given 09/25/22 0030)  furosemide (LASIX) injection 40 mg (40 mg Intravenous Given 09/25/22 0310)    Mobility walks     Focused Assessments Cardiac Assessment Handoff:  Cardiac Rhythm: Normal sinus rhythm Lab Results  Component Value Date   CKTOTAL 130 05/06/2011   CKMB 4.4 (H) 05/06/2011   TROPONINI <0.03 05/15/2017   Lab Results  Component Value Date   DDIMER 0.98 (H) 05/05/2016   Does the Patient currently have chest pain? No    R Recommendations: See Admitting Provider Note  Report given to:   Additional Notes: Patient is alert and oriented.

## 2022-09-25 NOTE — Inpatient Diabetes Management (Signed)
Inpatient Diabetes Program Recommendations  AACE/ADA: New Consensus Statement on Inpatient Glycemic Control   Target Ranges:  Prepandial:   less than 140 mg/dL      Peak postprandial:   less than 180 mg/dL (1-2 hours)      Critically ill patients:  140 - 180 mg/dL    Latest Reference Range & Units 09/25/22 05:37 09/25/22 06:10 09/25/22 06:12 09/25/22 08:04  Glucose-Capillary 70 - 99 mg/dL 50 (L) 56 (L) 91 161 (H)    Latest Reference Range & Units 02/27/22 04:01 09/25/22 07:03  Hemoglobin A1C 4.8 - 5.6 % 13.3 (H) 10.9 (H)   Review of Glycemic Control  Diabetes history: DM2 Outpatient Diabetes medications: 70/30 25 units QAM, 70/30 12 units QPM Current orders for Inpatient glycemic control: Semglee 10 units daily, Novolog 0-15 units TID with meals, Novolog 0-5 units QHS  Inpatient Diabetes Program Recommendations:    Insulin: No insulin given since arrival to hospital. CBG 50 mg/dl at 0:96 am today. May need to discontinue Semglee for now and change CBGs and Novolog correction to Q4H (for closer monitoring). Then if CBGs become consistently over 180 mg/dl with Novolog correction, could consider reordering basal insulin.   HbgA1C:  A1C 10.9% on 09/25/22 indicating an average glucose of 266 mg/dl over the past 2-3 months. Prior A1C 13.3% on 02/27/22.  Inpatient diabetes coordinator spoke with patient on 03/03/22 during last hospitalization and patient reported she takes am dose of 70/30 insulin does NOT take the PM dose before supper and she was not checking glucose consistently at home.   NOTE: Patient admitted on 09/25/22 with acute on chronic CHF. Per ED triage note on 09/24/22 at 11:32pm by Welton Flakes, RN "pt has not been eating and has not taken insulin, CBG 330, pt had insulin upon EMS arrival."  Unclear how much insulin was taken prior to arrival at hospital. CBG at 5:37 am today was 50 mg/dl. No insulin has been given since arrival to hospital.   Thanks, Orlando Penner, RN, MSN,  CDCES Diabetes Coordinator Inpatient Diabetes Program [phone] (Team Pager from 8am to 5pm)

## 2022-09-25 NOTE — Plan of Care (Signed)
  Problem: Education: Goal: Ability to demonstrate management of disease process will improve Outcome: Progressing Goal: Ability to verbalize understanding of medication therapies will improve Outcome: Progressing   Problem: Activity: Goal: Capacity to carry out activities will improve Outcome: Progressing   Problem: Cardiac: Goal: Ability to achieve and maintain adequate cardiopulmonary perfusion will improve Outcome: Progressing   Problem: Coping: Goal: Ability to adjust to condition or change in health will improve Outcome: Progressing   Problem: Fluid Volume: Goal: Ability to maintain a balanced intake and output will improve Outcome: Progressing   Problem: Metabolic: Goal: Ability to maintain appropriate glucose levels will improve Outcome: Progressing   Problem: Skin Integrity: Goal: Risk for impaired skin integrity will decrease Outcome: Progressing   Problem: Clinical Measurements: Goal: Will remain free from infection Outcome: Progressing   Problem: Elimination: Goal: Will not experience complications related to urinary retention Outcome: Progressing   Problem: Safety: Goal: Ability to remain free from injury will improve Outcome: Progressing

## 2022-09-25 NOTE — Progress Notes (Signed)
  Echocardiogram 2D Echocardiogram has been performed.  Delcie Roch 09/25/2022, 9:24 AM

## 2022-09-25 NOTE — Consult Note (Signed)
Cardiology Consultation:   Patient ID: [name] MRN: [id number]; DOB: 01-30-1946  Admit date: 09/24/2022 Date of Consult: 09/25/2022  Primary Care Provider: Darrow Bussing, MD [facility] Cardiologist: Bryan Lemma, MD  [facility] Electrophysiologist:  None    Patient Profile:   [name] is a 77 y.o. female with a hx of 77 year old female with history of DM type II, hypertension, hyperlipidemia, CKD, COPD, CHF with an EF of 35 to 40%, bilateral PAD who presents to the ER with complaints of fatigue, difficulty ambulating, pain and swelling in lower extremities, cough and shortness of breath over the last several days.    History of Present Illness:   [name] reports that for the past 2 weeks, she has had progressive swelling in her lower extremities, cough, shortness of breath, difficulty ambulating.  Her feet have become so painful that they are painful, hence causing difficulty with walking.  She has gained about 25 pounds of fluid weight.  Her baseline weight is approximately 125 pounds, and she reports now being around 150.  She has lost her appetite.  For the past 3-4 days, she has had diarrhea.  Her diarrhea has been overflowing out of her depends diapers.  She is also felt very fatigued.  Glucoses have been uncontrolled in the 400s.  She has an appointment scheduled with Dr. Gery Pray for next month, but due to progressive leg swelling, inability to take care of herself due to inability to ambulate, and edema, she called EMS.  In the ED, troponins were found to trend from 15 38 - 14 38.  BNP was greater than 4500.  BMP with creatinine 1.6 (baseline around 1.2).  Past Medical History:  Diagnosis Date   Breast cancer (HCC)    breast - left    Chronic diastolic CHF (congestive heart failure), NYHA class 2 (HCC)    a. cMRI 5/10: EF 38% // b. Echo 3/13/: EF 50-55, Gr 1 DD // c. Echo 8/14: EF 40, inf/inf-septal HK, Gr 1 DD, mild MR // d. Echo 5/17: EF 40-45, inf HK, Gr 1 DD, mild  MR, mild LAE, reduced RVSF, mild TR, PASP 33 // e. Echo 10/17: EF 40-45, Gr 2 DD, mild MR, severe LAE; => 09/2020 EF 50-55%, GrII DD.   Coronary artery disease, non-occlusive    a. nonobs by LHC in 2010 // b. Myoview 10/17: Lg infarct apex, distal ant and lat walls, no ischemia, EF 38; int risk  // c. LHC 10/17:  dLAD 15, pLCx 30, mRCA 40, LVEDP 15   Diabetes mellitus    NICM (nonischemic cardiomyopathy) (HCC)     Past Surgical History:  Procedure Laterality Date   APPENDECTOMY     BREAST LUMPECTOMY     lt breast   CARDIAC CATHETERIZATION N/A 12/20/2015   Procedure: Left Heart Cath and Coronary Angiography;  Surgeon: Yvonne Kendall, MD:: Mild to moderate, nonobstructive coronary artery disease, including 15% distal LAD stenosis, 30% ulcerated mid LCx disease, and 40% mid RCA stenosis.  Upper normal left ventricular filling pressure (LVEDP 15 mmHg).   RIGHT HEART CATH N/A 05/08/2016   Procedure: Right Heart Cath;  Surgeon: Dolores Patty, MD;  Location: [facility] INV[facility] CV LAB;; RA = 3, RV = 34/5, PA = 38/12 (21), PCW = 12; Fick cardiac output/index = 4.6/2.6; PVR = 1.9 WU, Ao sat = 96%; PA sat = 58%, 62%    TRANSTHORACIC ECHOCARDIOGRAM  11/2015   a) 11/2015: EF 40 and 45%.  GRII DD; b)  04/2016: EF 20% with grade 2 diastolic dysfunction   TRANSTHORACIC ECHOCARDIOGRAM  10/2016   a) EF 55-60%.  Normal LV function.  Mild DD.Marland Kitchen  Normal valve Fxn.; b) 09/2020: EF 50 to 55%.  Normal function.  No R WMA.  Mild LVH.  GR 2 DD with elevated LAP.-Mild LA dilation.  Moderately elevated PAP estimated RVSP 47 mmHg, and RAP 8 mmHg.   TRIGGER FINGER RELEASE Left 04/23/2017   Procedure: RELEASE TRIGGER FINGER TO LEFT FORTH FINGER;  Surgeon: Louisa Second, MD;  Location: [facility];  Service: Plastics;  Laterality: Left;     Home Medications:  Prior to Admission medications   Medication Sig Start Date End Date Taking? Authorizing Provider  atorvastatin (LIPITOR) 40 MG tablet Take 1 tablet by  mouth once daily 09/05/22   Marykay Lex, MD  atorvastatin (LIPITOR) 40 MG tablet Take 1 tablet (40 mg total) by mouth daily. 09/04/22   Runell Gess, MD  benzonatate (TESSALON) 100 MG capsule Take 1 capsule (100 mg total) by mouth 3 (three) times daily as needed for cough. 03/04/22   Rai, Delene Ruffini, MD  carvedilol (COREG) 3.125 MG tablet Take 1 tablet (3.125 mg total) by mouth 2 (two) times daily with a meal. 09/04/22   Runell Gess, MD  furosemide (LASIX) 40 MG tablet Take 1 tablet (40 mg total) by mouth daily. 09/04/22   Runell Gess, MD  guaiFENesin-dextromethorphan (ROBITUSSIN DM) 100-10 MG/5ML syrup Take 5 mLs by mouth every 4 (four) hours as needed for cough (chest congestion). Patient not taking: Reported on 06/23/2022 03/04/22   Rai, Delene Ruffini, MD  insulin NPH-regular Human (70-30) 100 UNIT/ML injection 22 units with breakfast, and 12 units with supper. Patient taking differently: Inject 25 Units into the skin as needed (for high BS). 12/10/20   Romero Belling, MD  sacubitril-valsartan (ENTRESTO) 49-51 MG Take 1 tablet by mouth 2 (two) times daily. 06/23/22   Jodelle Gross, NP    Inpatient Medications: Scheduled Meds:  Continuous Infusions:  PRN Meds:   Allergies:    Allergies  Allergen Reactions   Tape Rash    Pt can tolerate the paper tape    Social History:   Social History   Socioeconomic History   Marital status: Widowed    Spouse name: Not on file   Number of children: Not on file   Years of education: Not on file   Highest education level: Not on file  Occupational History   Not on file  Tobacco Use   Smoking status: Former    Current packs/day: 0.00    Types: Cigarettes    Start date: 09/30/1982    Quit date: 09/29/2012    Years since quitting: 9.9   Smokeless tobacco: Never  Substance and Sexual Activity   Alcohol use: No   Drug use: No   Sexual activity: Not on file  Other Topics Concern   Not on file  Social History Narrative    Not on file   Social Determinants of Health   Financial Resource Strain: Not on file  Food Insecurity: No Food Insecurity (02/27/2022)   Hunger Vital Sign    Worried About Running Out of Food in the Last Year: Never true    Ran Out of Food in the Last Year: Never true  Transportation Needs: No Transportation Needs (02/27/2022)   PRAPARE - Administrator, Civil Service (Medical): No    Lack of Transportation (Non-Medical): No  Physical  Activity: Not on file  Stress: Not on file  Social Connections: Not on file  Intimate Partner Violence: Not At Risk (02/27/2022)   Humiliation, Afraid, Rape, and Kick questionnaire    Fear of Current or Ex-Partner: No    Emotionally Abused: No    Physically Abused: No    Sexually Abused: No    Family History:    Family History  Problem Relation Age of Onset   Stroke Father    Diabetes Maternal Grandmother      ROS:  Review of Systems: [y] = yes, [ ]  = no      General: Weight gain [y]; Weight loss [ ] ; Anorexia [y]; Fatigue [ ] ; Fever [ ] ; Chills [ ] ; Weakness [ ]    Cardiac: Chest pain/pressure [ ] ; Resting SOB [ ] ; Exertional SOB [y]; Orthopnea [ ] ; Pedal Edema [y]; Palpitations [ ] ; Syncope [ ] ; Presyncope [ ] ; Paroxysmal nocturnal dyspnea [ ]    Pulmonary: Cough [y]; Wheezing [ ] ; Hemoptysis [ ] ; Sputum [ ] ; Snoring [ ]    GI: Vomiting [ ] ; Dysphagia [ ] ; Melena [ ] ; Hematochezia [ ] ; Heartburn [ ] ; Abdominal pain [ ] ; Constipation [ ] ; Diarrhea [y]; BRBPR [ ]    GU: Hematuria [ ] ; Dysuria [ ] ; Nocturia [ ]  Vascular: Pain in legs with walking [place] ]; Pain in feet with lying flat [ ] ; Non-healing sores [ ] ; Stroke [ ] ; TIA [ ] ; Slurred speech [ ] ;   Neuro: Headaches [ ] ; Vertigo [ ] ; Seizures [ ] ; Paresthesias [ ] ;Blurred vision [ ] ; Diplopia [ ] ; Vision changes [ ]    Ortho/Skin: Arthritis [ ] ; Joint pain [ ] ; Muscle pain [ ] ; Joint swelling [ ] ; Back Pain [ ] ; Rash [ ]    Psych: Depression [ ] ; Anxiety [ ]    Heme: Bleeding problems [ ] ;  Clotting disorders [ ] ; Anemia [ ]    Endocrine: Diabetes [ ] ; Thyroid dysfunction [ ]    Physical Exam/Data:   Vitals:   09/25/22 0030 09/25/22 0045 09/25/22 0100 09/25/22 0115  BP: 100/72 127/75 125/78 126/72  Pulse: 70 69 69 67  Resp: (!) 22 (!) 23 (!) 24 (!) 22  Temp:      TempSrc:      SpO2: 99% 97% 98% 98%  Weight:      Height:       No intake or output data in the 24 hours ending 09/25/22 0229    09/24/2022   11:40 PM 06/23/2022    3:14 PM 02/27/2022    4:00 AM  Last 3 Weights  Weight (lbs) 128 lb 128 lb 12.8 oz 117 lb 11.6 oz  Weight (kg) 58.06 kg 58.423 kg 53.4 kg     Body mass index is 22.67 kg/m.  General:  Well nourished, well developed, in no acute distress HEENT: normal Lymph: no adenopathy Neck: no JVD Endocrine:  No thryomegaly Vascular: No carotid bruits; FA pulses 2+ bilaterally without bruits  Cardiac:  normal S1, S2; RRR; no murmur, JVD to earlobe at 30 degrees Lungs:  rales, no wheezing, rhonchi or rales  Abd: soft, nontender, no hepatomegaly  Ext: 3+ LE edema to the thighs Musculoskeletal:  No deformities, BUE and BLE strength normal and equal Skin: warm and dry  Neuro:  CNs 2-12 intact, no focal abnormalities noted Psych:  Normal affect   EKG:  The EKG was personally reviewed and demonstrates: Left  anterior fascicular block, frequent PVCs, heart rate in the 70s, QTc 407 ms, sinus  rhythm Telemetry:  Telemetry was personally reviewed and demonstrates: Frequent PVCs  Relevant CV Studies: Cardiac MRI 07/07/2008: Impressions: 1.  Moderate LV systolic dysfunction with EF 38%.  The inferior and  inferolateral basilar wall appear segmentally worse.  2.  Very small area of mid-wall delayed enhancement in the  inferolateral base. This is nonspecific and can be seen with  infiltrative diseases such as sarcoid as well as with myocarditis.  _______________   Left Cardiac Catheterization 12/20/2015: Conclusions: Mild to moderate, nonobstructive coronary  artery disease, including 15% distal LAD stenosis, 30% ulcerated mid LCx disease, and 40% mid RCA stenosis. Upper normal left ventricular filling pressure (LVEDP 15 mmHg).   Recommendations: Aggressive risk factor modification including high-intensity statin therapy. Continue evidence based heart failure therapy for nonischemic cardiomyopathy. Hydrate gently today following procedure; restart standing furosemide tomorrow. _______________   Echocardiogram 05/05/2016: Study Conclusions: - Left ventricle: The cavity size was normal. Wall thickness was    normal. The estimated ejection fraction was 20%. Diffuse    hypokinesis. Features are consistent with a pseudonormal left    ventricular filling pattern, with concomitant abnormal relaxation    and increased filling pressure (grade 2 diastolic dysfunction).  - Aortic valve: There was no stenosis.  - Mitral valve: There was mild regurgitation.  - Left atrium: The atrium was mildly dilated.  - Right ventricle: The cavity size was normal. Systolic function    was mildly reduced.  - Tricuspid valve: Peak RV-RA gradient (S): 18 mm Hg.  - Pulmonary arteries: PA peak pressure: 21 mm Hg (S).  - Inferior vena cava: The vessel was normal in size. The    respirophasic diameter changes were in the normal range (>= 50%),    consistent with normal central venous pressure.   Impressions: - Normal LV size with EF 20%, diffuse hypokinesis. Moderate    diastolic dysfunction. Normal RV size with mildly decreased    systolic function. Mild mitral regurgitation. The IVC was not    dilated.  _______________   Right Cardiac Catheterization 05/26/2016: Findings: RA = 3 RV = 34/5 PA = 38/12 (21) PCW = 12 Fick cardiac output/index = 4.6/2.6 PVR = 1.9 WU Ao sat = 96% PA sat = 58%, 62%   Assessment: 1. Well compensated filling pressures and cardiac output _______________   Holter Monitor 05/2016: Normal sinus rhythm with frequent PVCs and PACs with  occasional brief runs of SVT.    PVC burden down to 5.8% _______________   Echocardiogram 09/20/2020 Study Conclusions: 1. Left ventricular ejection fraction, by estimation, is 50 to 55%. The  left ventricle has low normal function. The left ventricle has no regional  wall motion abnormalities. There is mild left ventricular hypertrophy.  Left ventricular diastolic  parameters are consistent with Grade II diastolic dysfunction  (pseudonormalization). Elevated left atrial pressure.   2. Right ventricular systolic function is normal. The right ventricular  size is normal. There is moderately elevated pulmonary artery systolic  pressure. The estimated right ventricular systolic pressure is 46.7 mmHg.   3. Left atrial size was mildly dilated.   4. The mitral valve is normal in structure. Mild mitral valve  regurgitation.   5. The aortic valve is tricuspid. Aortic valve regurgitation is not  visualized. No aortic stenosis is present.   6. The inferior vena cava is dilated in size with >50% respiratory  variability, suggesting right atrial pressure of 8 mmHg.  - Impressions: - Normal LV systolic function; mild diastolic dysfunction; global    longitudinal  strain - 9.2%.  Laboratory Data:  High Sensitivity Troponin:   Recent Labs  Lab 09/25/22 0029 09/25/22 0132  TROPONINIHS 1,538* 1,438*     Chemistry Recent Labs  Lab 09/25/22 0029  NA 135  K 3.8  CL 100  CO2 25  GLUCOSE 179*  BUN 32*  CREATININE 1.65*  CALCIUM 8.6*  GFRNONAA 32*  ANIONGAP 10    Recent Labs  Lab 09/25/22 0029  PROT 5.8*  ALBUMIN 3.0*  AST 32  ALT 28  ALKPHOS 90  BILITOT 1.0   Hematology Recent Labs  Lab 09/25/22 0029  WBC 3.1*  RBC 3.46*  HGB 9.3*  HCT 29.7*  MCV 85.8  MCH 26.9  MCHC 31.3  RDW 18.0*  PLT 227   BNP Recent Labs  Lab 09/25/22 0029  BNP >4,500.0*    DDimer No results for input(s): "DDIMER" in the last 168 hours.  Radiology/Studies:  DG Chest Portable 1  View  Result Date: 09/25/2022 CLINICAL DATA:  Cough. EXAM: PORTABLE CHEST 1 VIEW COMPARISON:  None Available. FINDINGS: The cardiac silhouette is enlarged and unchanged in size. Mild perihilar prominence of the pulmonary vasculature is seen. There is marked severity calcification of the aortic arch. Mild, diffuse, chronic appearing increased interstitial lung markings are seen. Mild atelectasis and/or infiltrate is noted within the left lung base. No pleural effusion or pneumothorax is identified. The visualized skeletal structures are unremarkable. IMPRESSION: 1. Cardiomegaly with mild pulmonary vascular congestion. 2. Mild left basilar atelectasis and/or infiltrate. Electronically Signed   By: Aram Candela M.D.   On: 09/25/2022 00:21   {   Assessment and Plan:   [name] is a 77 y.o. female with a hx of 77 year old female with history of DM type II, hypertension, hyperlipidemia, CKD, COPD, CHF with an EF of 35 to 40%, bilateral PAD who presenting with acute on chronic heart failure  Acute on chronic HFrEF (recovered ejection fraction most recent 55%) Non-Obstructive CAD Historically nadir in EF of 20%, which has improved with GDMT.  Presenting with multiple weeks of progressive lower extremity edema, shortness of breath, and heart failure symptoms.  Exam notable for rales, 3+ lower extremity edema, JVD to the earlobe.  Labs notable for elevated troponins (1538 -> 1438), markedly elevated BNP > 4500, CXR with pulmonary edema. Altogether c/w acute HF. Unclear cause of this exacerbation; she is compliant with her meds, and has doubled up on her furosemide in recent week. -Agree with IV lasix 80mg  total as of now in the ED as did not respond to 40mg  dose -Aggressive IV diuresis per primary team around the clock, goal >2L negative daily -2g Na Restriction, 2L fluid restriction -strict I/Os, daily weights -Would update TTE to ensure no change in her EF or cause of acute decompensation -Okay to  continue home carvedilol and entresto as long as MAPs remain >70 -Iron panel as mildly anemic, could benefit from IV iron while here -Continue statin, goal LDL < 70  2. AKI on CKD Likely Cardiorenal physiology given ADHF as above -diuresis as above  3. Anemia -Iron panel as above  4. Chronic Myocardial Injury Troponins trended from 1538 -> 1438 (<20% delta), w/o chest pain symptoms, altogether c/w chronic myocardial injury in the setting of her current HF exacerbation along with poor clearance of troponins from AKI and CKD  Dispo: recommend admission to hospital medicine given IDDM, concomitant possible GI illness with diarrhea, possible respiratory infeciton     For questions or updates, please contact [facility] Please consult [email or web address] for contact info under    Signed, Freddy Finner, MD  09/25/2022 2:29 AM

## 2022-09-25 NOTE — Assessment & Plan Note (Addendum)
Volume status improving.  Today serum cr is 1,80 with K at 3,9 and serum bicarbonate at 25. Na 135 Mg 2.3   Diuresis with bumetanide, spironolactone and SGLT 2 inh Follow up renal function in am. Add extra 40 kcl x2 to prevent hypokalemia.

## 2022-09-25 NOTE — Progress Notes (Signed)
Heart Failure Stewardship Pharmacist Progress Note   PCP: Darrow Bussing, MD PCP-Cardiologist: Bryan Lemma, MD    HPI:  77 yo F with PMH of HFrEF, T2DM, HTN, HLD, CKD, COPD, and PAD.  Admitted 11/2015 with A/C Systolic HF. Echo at that time with EF 40-45%. Cath during that admission with mild to moderate, but non-obstructive CAD including 15% distal LAD stenosis, 30% ulcerated mid LCx disease, and 40% mid RCA stenosis. Followe by the advanced heart failure team until 11/2016 when EF recovered to 55-60%.   She was admitted 09/2020 with acute on chronic HF in the setting of dietary noncompliance. EF was 50-55%. She was scheduled to follow up with HF TOC and cancelled her appt.   Admitted 02/2022 with AKI, RSV, and acute HF. BNP >4500. EF back down 35-40%. Patient was restarted on Entresto and continued on carvedilol with outpatient consideration of MRA and SGLT2i once recovered form RSV.   Presented to the ED on 8/4 with LE edema, shortness of breath, weight gain cough, N/D, fatigue, and body aches. She states over the last two weeks she has had progression of these symptoms. In the ED trop 0254>2706, BNP >4500. CXR with mild pulmonary vascular congestion. Repeat ECHO pending.   Current HF Medications: Diuretic: bumetanide 2 mg IV BID Beta Blocker: carvedilol 3.125 mg BID ACE/ARB/ARNI: Entresto 24/26 mg BID SGLT2i: Jardiance 10 mg daily Other: Ferrlecit 250 mg IV daily x 4 doses  Prior to admission HF Medications: Diuretic: furosemide 40 mg BID Beta blocker: carvedilol 3.125 mg BID ACE/ARB/ARNI: Entresto 49/51 mg BID  Pertinent Lab Values: Serum creatinine 1.58, BUN 35, Potassium 4.3, Sodium 136, BNP >4500, A1c 10.9 (improved from 13.3 in January)   Vital Signs: Weight: 145 lbs (admission weight: 145 lbs) Blood pressure: 120/70s  Heart rate: 70s  I/O: net -0.7L since admission  Medication Assistance / Insurance Benefits Check: Does the patient have prescription insurance?  No  Does the patient qualify for medication assistance through manufacturers or grants?   Yes Eligible grants and/or patient assistance programs: Entresto Medication assistance applications in progress: None  Medication assistance applications approved: Entresto Approved medication assistance renewals will be completed by: [facility]  Outpatient Pharmacy:  Prior to admission outpatient pharmacy: [facility] Is the patient willing to use [facility] TOC pharmacy at discharge? Yes Is the patient willing to transition their outpatient pharmacy to utilize a [facility] outpatient pharmacy?   No    Assessment: 1. Acute on chronic systolic CHF (LVEF 35-40%). NYHA class III symptoms. - Continue bumetanide 2 mg IV BID. Strict I/Os and daily weights. Keep K>4 and Mg>2. - Continue carvedilol 3.125 mg BID - Continue Entresto 24/26 mg BID, consider increasing back to 49/51 mg prior to discharge - Consider starting MRA prior to discharge once renal function stable - Continue Jardiance 10 mg daily - will start patient assistance for this - Continue Ferrlecit 250 mg IV daily x 4 doses   Plan: 1) Medication changes recommended at this time: - Continue current regimen  2) Patient assistance: - Already receives Entresto from [employer] patient assistance - Landscape [employer] for [employer] patient assistance initiated. Patient portion completed and signed.   3)  Education  - Patient has been educated on current HF medications and potential additions to HF medication regimen - Patient verbalizes understanding that over the next few months, these medication doses may change and more medications may be added to optimize HF regimen - Patient has been educated on basic disease state pathophysiology and goals of therapy  Sharen Hones, PharmD, BCPS Heart Failure Stewardship Pharmacist Phone [phone]

## 2022-09-25 NOTE — Assessment & Plan Note (Signed)
admit to card telemetry bed.  She benefit from seeing CHF consult.  She has been on 80 of Lasix orally twice a day for the last month according to her.  She has had minimal increase in her urine output.  Will switch to IV Bumex 2 mg twice daily in effort to stimulate diuresis.  Will add Diamox 500 mg twice daily.  Continue her on her guideline directed medical therapy with Entresto.  Update her echo.

## 2022-09-25 NOTE — Progress Notes (Addendum)
Rounding Note    Patient Name: [name] Date of Encounter: 09/25/2022  [facility] Cardiologist: Bryan Lemma, MD   Subjective   Still SOB, not at baseline, per family member, she has gained a lot of weight in a short period of time  Inpatient Medications    Scheduled Meds:  acetaZOLAMIDE  500 mg Oral BID   aspirin EC  81 mg Oral Daily   atorvastatin  40 mg Oral Daily   bumetanide (BUMEX) IV  2 mg Intravenous BID   carvedilol  3.125 mg Oral BID WC   heparin  5,000 Units Subcutaneous Q8H   insulin aspart  0-15 Units Subcutaneous TID WC   insulin aspart  0-5 Units Subcutaneous QHS   insulin glargine-yfgn  10 Units Subcutaneous Daily   sacubitril-valsartan  1 tablet Oral BID   Continuous Infusions:  PRN Meds: acetaminophen **OR** acetaminophen, melatonin, ondansetron **OR** ondansetron (ZOFRAN) IV   Vital Signs    Vitals:   09/25/22 0325 09/25/22 0400 09/25/22 0532 09/25/22 0532  BP: 130/81 121/70  125/70  Pulse: 71 65  64  Resp: (!) 26 (!) 22  16  Temp:    98.5 F (36.9 C)  TempSrc:    Oral  SpO2: 99% 98%  99%  Weight:   66 kg   Height:   5\' 3"  (1.6 m)     Intake/Output Summary (Last 24 hours) at 09/25/2022 0747 Last data filed at 09/25/2022 0320 Gross per 24 hour  Intake --  Output 400 ml  Net -400 ml      09/25/2022    5:32 AM 09/25/2022    3:19 AM 09/24/2022   11:40 PM  Last 3 Weights  Weight (lbs) 145 lb 8.1 oz 150 lb 12.8 oz 128 lb  Weight (kg) 66 kg 68.402 kg 58.06 kg      Telemetry    SR, +morphology changes - Personally Reviewed  ECG    None today - Personally Reviewed  Physical Exam   GEN: No acute distress.   Neck: JVD to jaw Cardiac: RRR, no murmur, no rubs, or gallops.  Respiratory: diminished to auscultation bilaterally with rales   GI: Soft, nontender, non-distended  MS: R>L  edema; No deformity. Neuro:  Nonfocal  Psych: Normal affect    Labs    High Sensitivity Troponin:   Recent Labs  Lab 09/25/22 0029  09/25/22 0132  TROPONINIHS 1,538* 1,438*     Chemistry Recent Labs  Lab 09/25/22 0029  NA 135  K 3.8  CL 100  CO2 25  GLUCOSE 179*  BUN 32*  CREATININE 1.65*  CALCIUM 8.6*  PROT 5.8*  ALBUMIN 3.0*  AST 32  ALT 28  ALKPHOS 90  BILITOT 1.0  GFRNONAA 32*  ANIONGAP 10    Lipids No results for input(s): "CHOL", "TRIG", "HDL", "LABVLDL", "LDLCALC", "CHOLHDL" in the last 168 hours.  Hematology Recent Labs  Lab 09/25/22 0029  WBC 3.1*  RBC 3.46*  HGB 9.3*  HCT 29.7*  MCV 85.8  MCH 26.9  MCHC 31.3  RDW 18.0*  PLT 227   Thyroid No results for input(s): "TSH", "FREET4" in the last 168 hours.  BNP Recent Labs  Lab 09/25/22 0029  BNP >4,500.0*    DDimer No results for input(s): "DDIMER" in the last 168 hours.   Radiology    DG Chest Portable 1 View  Result Date: 09/25/2022 CLINICAL DATA:  Cough. EXAM: PORTABLE CHEST 1 VIEW COMPARISON:  None Available. FINDINGS: The cardiac silhouette  is enlarged and unchanged in size. Mild perihilar prominence of the pulmonary vasculature is seen. There is marked severity calcification of the aortic arch. Mild, diffuse, chronic appearing increased interstitial lung markings are seen. Mild atelectasis and/or infiltrate is noted within the left lung base. No pleural effusion or pneumothorax is identified. The visualized skeletal structures are unremarkable. IMPRESSION: 1. Cardiomegaly with mild pulmonary vascular congestion. 2. Mild left basilar atelectasis and/or infiltrate. Electronically Signed   By: Aram Candela M.D.   On: 09/25/2022 00:21    Cardiac Studies   ECHO: ordered  ECHO: 02/27/2022  1. Left ventricular ejection fraction, by estimation, is 35 to 40%. The left ventricle has moderately decreased function. The left ventricle demonstrates global hypokinesis. The left ventricular internal cavity size  was moderately dilated. Left ventricular diastolic parameters are consistent with Grade III diastolic  dysfunction  (restrictive).   2. Right ventricular systolic function is low normal. The right ventricular size is moderately enlarged. There is mildly elevated pulmonary artery systolic pressure. The estimated RV systolic pressure is 39.1 mmHg.   3. Left atrial size was moderately dilated.   4. Right atrial size was moderately dilated.   5. The mitral valve is degenerative. Mild mitral valve regurgitation. No evidence of mitral stenosis.   6. The aortic valve is tricuspid. Aortic valve regurgitation is not visualized. No AS is present.   7. The inferior vena cava is normal in size with <50% respiratory variability,suggesting RA pressure 8 mmHg.   8. Evidence of atrial level shunting detected by color flow Doppler. There is a small patent foramen ovale with predominantly left to right shunting across the atrial septum.   Comparison(s): The left ventricular function is worsened. Prior EF 50-55%.   CARDIAC CATH: 12/20/2015 Mild to moderate, nonobstructive coronary artery disease, including 15% distal LAD stenosis, 30% ulcerated mid LCx disease, and 40% mid RCA stenosis.   Patient Profile     77 y.o. female with a hx of 77 year old female with history of DM type II, hypertension, hyperlipidemia, CKD, COPD, CHF with an EF of 35 to 40%, bilateral PAD who presented to the ER 08/05 with complaints of fatigue, difficulty ambulating, pain and swelling in lower extremities, cough and shortness of breath over the last several days.    Assessment & Plan    Acute on chronic combined CHF - EF 20% 2018, non-obs dz by cath 2017 - increased to 50-55% but down to 35-40% at last echo - repeat echo pending - continue diuresis, wt down 5 lbs overnight. - since K+ 3.8 just after midnight, will recheck and do daily  2. Elevated troponin - hx non-obs dz by cath 2017 - her trops have never been this high - f/u on echo, discuss w/ MD if she may need R/L cath once resp status improved.  3.CKD III - Cr peak 2.16 Mar 2022  when she was hospitalized for RSV, pneumonia, and heart failure -Last creatinine prior to today was in January and it was 1.16 -Following with diuresis  Otherwise, per IM     For questions or updates, please contact [facility] Please consult [email or web address] for contact info under        Signed, Theodore Demark, PA-C  09/25/2022, 7:47 AM    Personally seen and examined. Agree with APP above with the following comments:  Briefly 77 yo F wth history of HTN, DM, HLD, CKD stage IIIb with fairly variable creatinine, with HFrEF   Patient notes she is improved but still feeling  short of breath. No CP or palpitations.  Exam notable for  Elderly female, JVD is elevated.  Decreased breath sounds with crackles.  +2 bilateral edema with cool extremities.  Systolic murmur noted.  Labs notable for creatinine 1.65  EKG SR with frequent ectopy Tele: SR with ectopy Personally reviewed relevant tests;  LVEF is reduced, RV is enlarged, mild-mod MR, Severe TR, IVC is plethoric  Would recommend  - agree with ASA and statin (PAD indication) - Agree with ARNI; adding SGLT2i today; I am not sure creatinine will support MRA - if worsening creatinine, given her cool extremities I have a low threshold for RHC  - continue low dose coreg for now - agree with IV iron - will increase bumex if needed  Riley Lam, MD FASE [facility] Cardiologist [facility]  [address] [place], [place] [address] [phone]  11:29 AM

## 2022-09-25 NOTE — Progress Notes (Signed)
Heart Failure Nurse Navigator Progress Note  PCP: Darrow Bussing, MD PCP-Cardiologist: Allyson Sabal Admission Diagnosis: Acute on chronic congestive heart failure, PAD, Leukopenia.  Admitted from: Home via EMS  Presentation:   [name] presented with complaints of a cough , fatigue/ weakness for about 3 days, reports a increased in her legs and feet swelling and a feeling of such heaviness in them . On EMS arrival CBG was 330. BNP >4,500, Troponin 1,438. 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 Diet/ fluid restrictions, taking all medications as prescribed and attending all medical appointments. Patient verbalized her understanding. A HF TOC appointment was scheduled for 10/09/2022 @ 3pm.   ECHO/ LVEF: 30-35%  Clinical Course:  Past Medical History:  Diagnosis Date   Breast cancer (HCC)    breast - left    Chronic diastolic CHF (congestive heart failure), NYHA class 2 (HCC)    a. cMRI 5/10: EF 38% // b. Echo 3/13/: EF 50-55, Gr 1 DD // c. Echo 8/14: EF 40, inf/inf-septal HK, Gr 1 DD, mild MR // d. Echo 5/17: EF 40-45, inf HK, Gr 1 DD, mild MR, mild LAE, reduced RVSF, mild TR, PASP 33 // e. Echo 10/17: EF 40-45, Gr 2 DD, mild MR, severe LAE; => 09/2020 EF 50-55%, GrII DD.   Coronary artery disease, non-occlusive    a. nonobs by LHC in 2010 // b. Myoview 10/17: Lg infarct apex, distal ant and lat walls, no ischemia, EF 38; int risk  // c. LHC 10/17:  dLAD 15, pLCx 30, mRCA 40, LVEDP 15   Diabetes mellitus    NICM (nonischemic cardiomyopathy) (HCC)      Social History   Socioeconomic History   Marital status: Widowed    Spouse name: Not on file   Number of children: Not on file   Years of education: Not on file   Highest education level: Not on file  Occupational History   Not on  file  Tobacco Use   Smoking status: Former    Current packs/day: 0.00    Types: Cigarettes    Start date: 09/30/1982    Quit date: 09/29/2012    Years since quitting: 9.9   Smokeless tobacco: Never  Substance and Sexual Activity   Alcohol use: No   Drug use: No   Sexual activity: Not on file  Other Topics Concern   Not on file  Social History Narrative   Not on file   Social Determinants of Health   Financial Resource Strain: Not on file  Food Insecurity: Patient Declined (09/25/2022)   Hunger Vital Sign    Worried About Running Out of Food in the Last Year: Patient declined    Ran Out of Food in the Last Year: Patient declined  Transportation Needs: No Transportation Needs (09/25/2022)   PRAPARE - Administrator, Civil Service (Medical): No    Lack of Transportation (Non-Medical): No  Physical Activity: Not on file  Stress: Not on file  Social Connections: Not on file   Education Assessment and Provision:  Detailed education and instructions provided on heart failure disease management including the following:  Signs and symptoms of Heart Failure When to call the physician Importance of daily weights Low sodium diet Fluid restriction Medication management Anticipated future  follow-up appointments  Patient education given on each of the above topics.  Patient acknowledges understanding via teach back method and acceptance of all instructions.  Education Materials:  "Living Better With Heart Failure" Booklet, HF zone tool, & Daily Weight Tracker Tool.  Patient has scale at home: Yes Patient has pill box at home: Yes    High Risk Criteria for Readmission and/or Poor Patient Outcomes: Heart failure hospital admissions (last 6 months): 1  No Show rate: 8 % Difficult social situation: No, her son lives with her.  Demonstrates medication adherence: Yes Primary Language: English Literacy level: Reading, writing, and comprehension.   Barriers of Care:   Diet/  fluids restrictions (salt) Daily weights Per patient, feeling of such heaviness in her legs)   Considerations/Referrals:   Referral made to Heart Failure Pharmacist Stewardship: yes Referral made to Heart Failure CSW/NCM TOC: No Referral made to Heart & Vascular TOC clinic: Yes, 10/09/2022 @ 3 pm.   Items for Follow-up on DC/TOC: Diet/ fluid restrictions ( salt)  Daily weights  Continued HF education   Rhae Hammock, BSN, RN Heart Failure Teacher, adult education Only

## 2022-09-25 NOTE — Assessment & Plan Note (Addendum)
Uncontrolled T2Dm with hyperglycemia/ hypoglycemia.   Insulin dose has been reduced to avoid hypoglycemia. At home she is on 70/30 insulin. Plan to resume home insulin at a lower dose of 5 units bid.  Close follow up as outpatient.   Continue with statin.

## 2022-09-25 NOTE — ED Notes (Signed)
Post void residual 57 mL

## 2022-09-25 NOTE — Assessment & Plan Note (Signed)
Echo from January 2024 showed an EF of 35%.  Update her echo.

## 2022-09-26 ENCOUNTER — Inpatient Hospital Stay (HOSPITAL_COMMUNITY): Payer: Medicare Other

## 2022-09-26 DIAGNOSIS — I5023 Acute on chronic systolic (congestive) heart failure: Secondary | ICD-10-CM | POA: Diagnosis not present

## 2022-09-26 LAB — BASIC METABOLIC PANEL
Anion gap: 10 (ref 5–15)
Anion gap: 11 (ref 5–15)
BUN: 34 mg/dL — ABNORMAL HIGH (ref 8–23)
BUN: 34 mg/dL — ABNORMAL HIGH (ref 8–23)
CO2: 28 mmol/L (ref 22–32)
CO2: 25 mmol/L (ref 22–32)
Calcium: 8.5 mg/dL — ABNORMAL LOW (ref 8.9–10.3)
Calcium: 8.4 mg/dL — ABNORMAL LOW (ref 8.9–10.3)
Chloride: 95 mmol/L — ABNORMAL LOW (ref 98–111)
Chloride: 96 mmol/L — ABNORMAL LOW (ref 98–111)
Creatinine, Ser: 1.54 mg/dL — ABNORMAL HIGH (ref 0.44–1.00)
Creatinine, Ser: 1.75 mg/dL — ABNORMAL HIGH (ref 0.44–1.00)
GFR, Estimated: 34 mL/min — ABNORMAL LOW (ref >60)
GFR, Estimated: 29 mL/min — ABNORMAL LOW (ref >60)
Glucose, Bld: 192 mg/dL — ABNORMAL HIGH (ref 70–99)
Glucose, Bld: 172 mg/dL — ABNORMAL HIGH (ref 70–99)
Potassium: 3.7 mmol/L (ref 3.5–5.1)
Potassium: 3.3 mmol/L — ABNORMAL LOW (ref 3.5–5.1)
Sodium: 133 mmol/L — ABNORMAL LOW (ref 135–145)
Sodium: 132 mmol/L — ABNORMAL LOW (ref 135–145)

## 2022-09-26 LAB — GLUCOSE, CAPILLARY
Glucose-Capillary: 54 mg/dL — ABNORMAL LOW (ref 70–99)
Glucose-Capillary: 176 mg/dL — ABNORMAL HIGH (ref 70–99)
Glucose-Capillary: 195 mg/dL — ABNORMAL HIGH (ref 70–99)
Glucose-Capillary: 145 mg/dL — ABNORMAL HIGH (ref 70–99)
Glucose-Capillary: 147 mg/dL — ABNORMAL HIGH (ref 70–99)
Glucose-Capillary: 133 mg/dL — ABNORMAL HIGH (ref 70–99)
Glucose-Capillary: 211 mg/dL — ABNORMAL HIGH (ref 70–99)

## 2022-09-26 LAB — BRAIN NATRIURETIC PEPTIDE: B Natriuretic Peptide: 4141.7 pg/mL — ABNORMAL HIGH (ref 0.0–100.0)

## 2022-09-26 LAB — CBC
HCT: 33.7 % — ABNORMAL LOW (ref 36.0–46.0)
Hemoglobin: 10.5 g/dL — ABNORMAL LOW (ref 12.0–15.0)
MCH: 27.4 pg (ref 26.0–34.0)
MCHC: 31.2 g/dL (ref 30.0–36.0)
MCV: 88 fL (ref 80.0–100.0)
Platelets: 237 10*3/uL (ref 150–400)
RBC: 3.83 MIL/uL — ABNORMAL LOW (ref 3.87–5.11)
RDW: 18 % — ABNORMAL HIGH (ref 11.5–15.5)
WBC: 3.2 10*3/uL — ABNORMAL LOW (ref 4.0–10.5)
nRBC: 0 % (ref 0.0–0.2)

## 2022-09-26 LAB — SODIUM, URINE, RANDOM: Sodium, Ur: 96 mmol/L

## 2022-09-26 LAB — HEMOGLOBIN A1C
Hgb A1c MFr Bld: 11 % — ABNORMAL HIGH (ref 4.8–5.6)
Mean Plasma Glucose: 269 mg/dL

## 2022-09-26 MED ORDER — DEXTROSE 50 % IV SOLN
25.0000 g | INTRAVENOUS | Status: AC
  Administered 2022-09-26: 25 g via INTRAVENOUS

## 2022-09-26 MED ORDER — DEXTROSE 5 % IV SOLN
3.0000 mg | Freq: Two times a day (BID) | INTRAVENOUS | Status: DC
  Administered 2022-09-26 – 2022-09-28 (×4): 3 mg via INTRAVENOUS
  Filled 2022-09-26: qty 12
  Filled 2022-09-26: qty 4
  Filled 2022-09-26 (×3): qty 12

## 2022-09-26 MED ORDER — GUAIFENESIN-DM 100-10 MG/5ML PO SYRP
5.0000 mL | ORAL_SOLUTION | ORAL | Status: DC | PRN
  Administered 2022-09-26 – 2022-10-01 (×3): 5 mL via ORAL
  Filled 2022-09-26 (×3): qty 5

## 2022-09-26 MED ORDER — INSULIN ASPART 100 UNIT/ML IJ SOLN
3.0000 [IU] | Freq: Three times a day (TID) | INTRAMUSCULAR | Status: DC
  Administered 2022-09-26 – 2022-09-27 (×4): 3 [IU] via SUBCUTANEOUS

## 2022-09-26 MED ORDER — ALBUTEROL SULFATE (2.5 MG/3ML) 0.083% IN NEBU: 2.5000 mg | INHALATION_SOLUTION | Freq: Once | RESPIRATORY_TRACT | Status: AC | PRN

## 2022-09-26 MED ORDER — ALBUTEROL SULFATE (2.5 MG/3ML) 0.083% IN NEBU
INHALATION_SOLUTION | RESPIRATORY_TRACT | Status: AC
  Administered 2022-09-26: 2.5 mg via RESPIRATORY_TRACT
  Filled 2022-09-26: qty 3

## 2022-09-26 MED ORDER — POTASSIUM CHLORIDE CRYS ER 20 MEQ PO TBCR
40.0000 meq | EXTENDED_RELEASE_TABLET | Freq: Two times a day (BID) | ORAL | Status: DC
  Administered 2022-09-26 – 2022-09-27 (×3): 40 meq via ORAL
  Filled 2022-09-26 (×2): qty 2

## 2022-09-26 MED ORDER — DEXTROSE 50 % IV SOLN
INTRAVENOUS | Status: AC
  Filled 2022-09-26: qty 50

## 2022-09-26 NOTE — Plan of Care (Signed)

## 2022-09-26 NOTE — Progress Notes (Addendum)
Rounding Note    Patient Name: [name] Date of Encounter: 09/26/2022  [facility] Cardiologist: Bryan Lemma, MD   Subjective   Earlier in this morning at approximately 630 patient had new onset episode of worsening shortness of breath and decreased mentation.  I was called to the bedside.  Patient had been placed on 6 L via nasal cannula, nurse reports more confusion.  Patient only able to nod to simple commands.  BGL was 52 which has now been corrected to around 170.  CXR was ordered that showed increased central vascular prominence consolidation.  Patient apparently hypervolemic with coarse breath sounds, wheezing, 2+ pitting edema or JVD.  Patient was given 2 mg of IV Bumex.  Reevaluated now about an hour later with 600 mL of diuresis and improved symptoms and mentation now.  Appears to be back at baseline, still weak.   Inpatient Medications    Scheduled Meds:  acetaZOLAMIDE  500 mg Oral BID   aspirin EC  81 mg Oral Daily   atorvastatin  40 mg Oral Daily   bumetanide (BUMEX) IV  2 mg Intravenous BID   carvedilol  3.125 mg Oral BID WC   dextrose       empagliflozin  10 mg Oral Daily   heparin  5,000 Units Subcutaneous Q8H   sacubitril-valsartan  1 tablet Oral BID   Continuous Infusions:  ferric gluconate (FERRLECIT) IVPB 250 mg (09/25/22 1116)   PRN Meds: acetaminophen **OR** acetaminophen, dextrose, melatonin, ondansetron **OR** ondansetron (ZOFRAN) IV   Vital Signs    Vitals:   09/26/22 0000 09/26/22 0540 09/26/22 0626 09/26/22 0707  BP:  (!) 123/100 134/86 (!) 136/93  Pulse:  86 87 89  Resp:  20  20  Temp:  97.7 F (36.5 C)    TempSrc:  Axillary    SpO2: 98% 92% 91% 100%  Weight:      Height:        Intake/Output Summary (Last 24 hours) at 09/26/2022 0816 Last data filed at 09/26/2022 0630 Gross per 24 hour  Intake 1502.98 ml  Output 1200 ml  Net 302.98 ml      09/25/2022    5:32 AM 09/25/2022    3:19 AM 09/24/2022   11:40 PM  Last 3 Weights   Weight (lbs) 145 lb 8.1 oz 150 lb 12.8 oz 128 lb  Weight (kg) 66 kg 68.402 kg 58.06 kg      Telemetry    Normal sinus rhythm heart rate in the 60s.  Frequent ectopy and PVCs.- Personally Reviewed  ECG    No new tracings- Personally Reviewed  Physical Exam   GEN: No acute distress.   Neck: + JVD Cardiac: RRR, no murmurs, rubs, or gallops.  Respiratory: Coarse breath sounds throughout, wheezing, diminished breath sounds GI: Soft, nontender, non-distended  MS: 2+ pitting edema  Neuro:  Nonfocal  Psych: Normal affect   Labs    High Sensitivity Troponin:   Recent Labs  Lab 09/25/22 0029 09/25/22 0132  TROPONINIHS 1,538* 1,438*     Chemistry Recent Labs  Lab 09/25/22 0029 09/25/22 1029 09/26/22 0644  NA 135 136 133*  K 3.8 4.3 3.7  CL 100 99 95*  CO2 25 27 28   GLUCOSE 179* 122* 192*  BUN 32* 34* 34*  CREATININE 1.65* 1.58* 1.54*  CALCIUM 8.6* 8.4* 8.5*  PROT 5.8*  --   --   ALBUMIN 3.0*  --   --   AST 32  --   --  ALT 28  --   --   ALKPHOS 90  --   --   BILITOT 1.0  --   --   GFRNONAA 32* 33* 34*  ANIONGAP 10 10 10     Lipids  Recent Labs  Lab 09/25/22 0703  CHOL 90  TRIG 32  HDL 34*  LDLCALC 50  CHOLHDL 2.6    Hematology Recent Labs  Lab 09/25/22 0029 09/26/22 0644  WBC 3.1* 3.2*  RBC 3.46* 3.83*  HGB 9.3* 10.5*  HCT 29.7* 33.7*  MCV 85.8 88.0  MCH 26.9 27.4  MCHC 31.3 31.2  RDW 18.0* 18.0*  PLT 227 237   Thyroid  Recent Labs  Lab 09/25/22 0703  TSH 2.166    BNP Recent Labs  Lab 09/25/22 0029 09/26/22 0644  BNP >4,500.0* 4,141.7*    DDimer No results for input(s): "DDIMER" in the last 168 hours.   Radiology    DG CHEST PORT 1 VIEW  Result Date: 09/26/2022 CLINICAL DATA:  [id number] with shortness of breath, coughing, CHF. EXAM: PORTABLE CHEST 1 VIEW COMPARISON:  Portable chest yesterday at 12:04 a.m. FINDINGS: 6:02 a.m. The heart is enlarged. Today there is increased central vascular prominence and generalized increased  interstitial consolidation consistent with mild interstitial edema. Small pleural effusions have also formed as well as perihilar haziness on the left which could be pneumonitis or asymmetric ground-glass edema. Remainder of the lungs are clear of focal opacities. The mediastinum is stable. The aortic arch is heavily calcified. No new osseous findings. IMPRESSION: 1. Increased central vascular prominence and interstitial consolidation consistent with mild interstitial edema. 2. Small pleural effusions. 3. New left perihilar haziness which could be pneumonitis or asymmetric ground-glass edema. Electronically Signed   By: Almira Bar M.D.   On: 09/26/2022 06:14   DG Chest Portable 1 View  Result Date: 09/25/2022 CLINICAL DATA:  Cough. EXAM: PORTABLE CHEST 1 VIEW COMPARISON:  None Available. FINDINGS: The cardiac silhouette is enlarged and unchanged in size. Mild perihilar prominence of the pulmonary vasculature is seen. There is marked severity calcification of the aortic arch. Mild, diffuse, chronic appearing increased interstitial lung markings are seen. Mild atelectasis and/or infiltrate is noted within the left lung base. No pleural effusion or pneumothorax is identified. The visualized skeletal structures are unremarkable. IMPRESSION: 1. Cardiomegaly with mild pulmonary vascular congestion. 2. Mild left basilar atelectasis and/or infiltrate. Electronically Signed   By: Aram Candela M.D.   On: 09/25/2022 00:21    Cardiac Studies   Echocardiogram 02/27/2022  1. Left ventricular ejection fraction, by estimation, is 35 to 40%. The  left ventricle has moderately decreased function. The left ventricle  demonstrates global hypokinesis. The left ventricular internal cavity size  was moderately dilated. Left  ventricular diastolic parameters are consistent with Grade III diastolic  dysfunction (restrictive).   2. Right ventricular systolic function is low normal. The right  ventricular size is  moderately enlarged. There is mildly elevated  pulmonary artery systolic pressure. The estimated right ventricular  systolic pressure is 39.1 mmHg.   3. Left atrial size was moderately dilated.   4. Right atrial size was moderately dilated.   5. The mitral valve is degenerative. Mild mitral valve regurgitation. No  evidence of mitral stenosis.   6. The aortic valve is tricuspid. Aortic valve regurgitation is not  visualized. No aortic stenosis is present.   7. The inferior vena cava is normal in size with <50% respiratory  variability, suggesting right atrial pressure of 8 mmHg.  8. Evidence of atrial level shunting detected by color flow Doppler.  There is a small patent foramen ovale with predominantly left to right  shunting across the atrial septum.   Comparison(s): The left ventricular function is worsened. Prior EF 50-55%.   Patient Profile     77 y.o. female with history of DM type II, hypertension, hyperlipidemia, CKD, COPD, chronic HFrEF with an EF of 35 to 40% in January 2024, bilateral PAD.  Patient admitted for worsening symptoms of peripheral edema, cough, shortness of breath, significant weight gain 25+ pounds.  Patient with history of reduced EF as low as 20% in 2018 thought likely nonischemic due to nonobstructive CAD noted on catheterization in 2018. She has had labile EF over the years increased to 50 to 55% 09/2020, however most recent echo in January 2024 showed EF 35 to 40% with mild RV dysfunction.  Grade 3 diastolic dysfunction.  RVSP 39.  Assessment & Plan    Acute on chronic HFrEF EF 35 to 40% in January 2024.  Patient overtly hypervolemic (2+ pitting edema, coarse breath sounds with wheezing, worsening chest x-ray indicating progressive interstitial edema) with significant symptoms of shortness of breath at rest, had acute episode this morning as noted above in subjective.  She appears more comfortable with improved symptoms after receiving IV Bumex 2 mg.  Unclear  how well she has been diuresed given inaccurate measurements and incontinence.  Nurse also reports poor urinary response.  Will continue to follow with diuresis in the next 1 to 2 hours to ensure going output, and make changes as necessary.   Continue acetazolamide 500 mg twice daily, Bumex 2 mg twice daily, carvedilol 3.125 mg twice daily, Jardiance 10 mg Entresto 24-26 mg twice daily.  Seems to be compensating decently right now.  Will continue to monitor if antihypertensives need to be titrated back or discontinued. Some question of decreased cardiac output, low threshold for right heart cath.  Currently on 4 L via nasal cannula. Urinary sodium obtained, within normal limits. Urinary output seems to be sporadic. Consider IV lasix drip  Echocardiogram still pending.  I will follow-up on this. Titrate GDMT once sufficiently diuresed.   Nonobstructive CAD Elevated troponins 1538, 1438 History of nonobstructive disease on cardiac catheterization in 2017.  Will follow-up on echo.  Will consider left and right heart catheterization once respiratory status is more stable Continue aspirin and atorvastatin 40 mg.  LDL 50.  CKD stage III Creatinine seems stable here and has actually improved slightly.  1.65 on admission.  Currently 1.54. on ferrilecit.   Normal sinus rhythm with frequent PVCs and ectopy Will discuss with ID whether further evaluation is warranted.   For questions or updates, please contact [facility] Please consult [email or web address] for contact info under        Signed, Abagail Kitchens, PA-C  09/26/2022, 8:16 AM     Personally seen and examined. Agree with APP above with the following comments:  Patient is more aware and alert since APP eval.  Still has worsening SOB. Exam notable for JVD, LE edema, and coarse breath sounds left lower lung field. Distal feet are cool, improved from 09/25/22. EF moderately decreased Labs notable for K of 3.7 Tele: SR with frequent  ectopy  Would recommend  - we will increase diuresis to 3 mg IV bumex BID, need PM BMP.  Needs scheduled potassium.  Will get echo results transferred over.Low threshold for RHC.  Riley Lam, MD FASE [facility] Cardiologist [place]  [facility]  [address] [place], [place] [address] [phone]  11:52 AM

## 2022-09-26 NOTE — Progress Notes (Signed)
Heart Failure Stewardship Pharmacist Progress Note   PCP: Darrow Bussing, MD PCP-Cardiologist: Bryan Lemma, MD    HPI:  77 yo F with PMH of HFrEF, T2DM, HTN, HLD, CKD, COPD, and PAD.  Admitted 11/2015 with A/C Systolic HF. Echo at that time with EF 40-45%. Cath during that admission with mild to moderate, but non-obstructive CAD including 15% distal LAD stenosis, 30% ulcerated mid LCx disease, and 40% mid RCA stenosis. Followe by the advanced heart failure team until 11/2016 when EF recovered to 55-60%.   She was admitted 09/2020 with acute on chronic HF in the setting of dietary noncompliance. EF was 50-55%. She was scheduled to follow up with HF TOC and cancelled her appt.   Admitted 02/2022 with AKI, RSV, and acute HF. BNP >4500. EF back down 35-40%. Patient was restarted on Entresto and continued on carvedilol with outpatient consideration of MRA and SGLT2i once recovered form RSV.   Presented to the ED on 8/4 with LE edema, shortness of breath, weight gain cough, N/D, fatigue, and body aches. She states over the last two weeks she has had progression of these symptoms. In the ED trop 7829>5621, BNP >4500. CXR with mild pulmonary vascular congestion. Repeat ECHO pending.   Overnight, had worsening shortness of breath and altered mental status. CXR with increased central vascular prominence consolidation. Received bumex 2 mg IV x 1. Symptoms improved.   Current HF Medications: Diuretic: bumetanide 2 mg IV BID Beta Blocker: carvedilol 3.125 mg BID ACE/ARB/ARNI: Entresto 24/26 mg BID SGLT2i: Jardiance 10 mg daily Other: Ferrlecit 250 mg IV daily x 4 doses  Prior to admission HF Medications: Diuretic: furosemide 40 mg BID Beta blocker: carvedilol 3.125 mg BID ACE/ARB/ARNI: Entresto 49/51 mg BID  Pertinent Lab Values: Serum creatinine 1.54, BUN 34, Potassium 3.7, Sodium 133, BNP >4500, A1c 10.9 (improved from 13.3 in January)   Vital Signs: Weight: 145 lbs (admission weight:  145 lbs) Blood pressure: 120-130/90s  Heart rate: 80s  I/O: net -0.7L since admission  Medication Assistance / Insurance Benefits Check: Does the patient have prescription insurance? No  Does the patient qualify for medication assistance through manufacturers or grants?   Yes Eligible grants and/or patient assistance programs: Valla Leaver Medication assistance applications in progress: Jardiance  Medication assistance applications approved: Entresto Approved medication assistance renewals will be completed by: [facility]: Lambert Rhonda W  Outpatient Pharmacy:  Prior to admission outpatient pharmacy: [facility] Is the patient willing to use [facility] pharmacy at discharge? Yes Is the patient willing to transition their outpatient pharmacy to utilize a [facility] outpatient pharmacy?   No    Assessment: 1. Acute on chronic systolic CHF (LVEF 35-40%). NYHA class III symptoms. - Continue bumetanide 2 mg IV BID. May need to transition to lasix drip considering overnight events and positive response to extra bumex dose. Strict I/Os and daily weights. Keep K>4 and Mg>2. - Continue carvedilol 3.125 mg BID - may need to hold if signs of low output - Continue Entresto 24/26 mg BID, consider increasing back to 49/51 mg prior to discharge - Consider starting MRA prior to discharge once renal function stable - Continue Jardiance 10 mg daily - patient assistance initiated - Continue Ferrlecit 250 mg IV daily x 4 doses   Plan: 1) Medication changes recommended at this time: - Change bumex to higher dose IV lasix  2) Patient assistance: - Already receives Entresto from [employer] patient assistance - Landscape architect for [employer] patient assistance initiated. Patient portion completed and signed.   3)  Education  - Patient has been educated on current HF medications and potential additions to HF medication regimen - Patient verbalizes understanding that over the next few months, these medication doses may change and  more medications may be added to optimize HF regimen - Patient has been educated on basic disease state pathophysiology and goals of therapy   Sharen Hones, PharmD, BCPS Heart Failure Stewardship Pharmacist Phone [phone]

## 2022-09-26 NOTE — Evaluation (Signed)
Occupational Therapy Evaluation Patient Details Name: [name] MRN: [id number] DOB: 1946/02/06 Today's Date: 09/26/2022   History of Present Illness 77 y.o. female presents to [facility] hospital on 09/24/2022 with worsening LE edema. Admitted for acute on chronic CHF. PMH includes breast CA, CHF, CAD, DM, NICM.   Clinical Impression   Pt reports ind at baseline with ADLs/functional mobility, reports her son has been living with her for the past year. Pt currently needing set up - min A for ADLs, supervision for bed mobility and min guard A for transfers without AD. Pt VSS on RA throughout. Pt with 1/4 DOE noted with standing grooming task. Pt presenting with impairments listed below, will follow acutely. Recommend HHOT at d/c pending progression.      Recommendations for follow up therapy are one component of a multi-disciplinary discharge planning process, led by the attending physician.  Recommendations may be updated based on patient status, additional functional criteria and insurance authorization.   Assistance Recommended at Discharge Set up Supervision/Assistance  Patient can return home with the following A little help with walking and/or transfers;A little help with bathing/dressing/bathroom;Assistance with cooking/housework;Assist for transportation;Help with stairs or ramp for entrance    Functional Status Assessment  Patient has had a recent decline in their functional status and demonstrates the ability to make significant improvements in function in a reasonable and predictable amount of time.  Equipment Recommendations  Tub/shower seat    Recommendations for Other Services PT consult     Precautions / Restrictions Precautions Precautions: Fall Restrictions Weight Bearing Restrictions: No      Mobility Bed Mobility Overal bed mobility: Needs Assistance Bed Mobility: Supine to Sit     Supine to sit: Supervision, HOB elevated          Transfers Overall transfer  level: Needs assistance Equipment used: None Transfers: Sit to/from Stand Sit to Stand: Min guard                  Balance Overall balance assessment: Needs assistance Sitting-balance support: No upper extremity supported, Feet supported Sitting balance-Leahy Scale: Good     Standing balance support: Single extremity supported, Reliant on assistive device for balance Standing balance-Leahy Scale: Poor                             ADL either performed or assessed with clinical judgement   ADL Overall ADL's : Needs assistance/impaired Eating/Feeding: Set up;Sitting   Grooming: Set up;Standing   Upper Body Bathing: Minimal assistance;Sitting   Lower Body Bathing: Minimal assistance;Sit to/from stand;Sitting/lateral leans   Upper Body Dressing : Minimal assistance;Sitting   Lower Body Dressing: Minimal assistance;Sitting/lateral leans;Sit to/from stand   Toilet Transfer: Min guard;Ambulation;Regular Social worker and Hygiene: Min guard       Functional mobility during ADLs: Min guard       Vision   Vision Assessment?: No apparent visual deficits     Development worker, international aid Tested?: No   Praxis Praxis Praxis tested?: Not tested    Pertinent Vitals/Pain Pain Assessment Pain Assessment: No/denies pain     Hand Dominance Right   Extremity/Trunk Assessment Upper Extremity Assessment Upper Extremity Assessment: Overall WFL for tasks assessed   Lower Extremity Assessment Lower Extremity Assessment: Defer to PT evaluation   Cervical / Trunk Assessment Cervical / Trunk Assessment: Normal   Communication Communication Communication: HOH   Cognition Arousal/Alertness: Awake/alert Behavior During Therapy: WFL for tasks assessed/performed  Overall Cognitive Status: Within Functional Limits for tasks assessed                                 General Comments: delayed responses, likely HOH vs  cognition, pt following commands appropriately     General Comments  VSS on RA    Exercises     Shoulder Instructions      Home Living Family/patient expects to be discharged to:: Private residence Living Arrangements: Alone;Children (son lives with pt currently (has been there for over a year)) Available Help at Discharge: Family;Available PRN/intermittently Type of Home: House Home Access: Stairs to enter [employer] of Steps: 2 Entrance Stairs-Rails: None Home Layout: Two level;Able to live on main level with bedroom/bathroom Alternate Level Stairs-Number of Steps: flight of steps Alternate Level Stairs-Rails: Left Bathroom Shower/Tub: Producer, television/film/video: Standard     Home Equipment: Rollator (4 wheels);Cane - single point;Grab bars - tub/shower;Grab bars - toilet          Prior Functioning/Environment Prior Level of Function : Independent/Modified Independent             Mobility Comments: no AD use ADLs Comments: ind, does IADLs, son or daughter provides transportation        OT Problem List: Decreased strength;Decreased range of motion;Decreased activity tolerance;Impaired balance (sitting and/or standing);Cardiopulmonary status limiting activity      OT Treatment/Interventions: Self-care/ADL training;Therapeutic exercise;Neuromuscular education;Energy conservation    OT Goals(Current goals can be found in the care plan section) Acute Rehab OT Goals Patient Stated Goal: none stated OT Goal Formulation: With patient Time For Goal Achievement: 10/10/22 Potential to Achieve Goals: Good ADL Goals Pt Will Perform Upper Body Dressing: with modified independence;sitting Pt Will Perform Lower Body Dressing: with modified independence;sit to/from stand;sitting/lateral leans Pt Will Transfer to Toilet: with modified independence;ambulating;regular height toilet Pt Will Perform Tub/Shower Transfer: Shower transfer;Tub transfer;with  modified independence;ambulating Additional ADL Goal #1: pt will verbalize x3 energy precaution strategies in order to improve activity tolerance for ADLs  OT Frequency: Min 1X/week    Co-evaluation              AM-PAC OT "6 Clicks" Daily Activity     Outcome Measure Help from another person eating meals?: None Help from another person taking care of personal grooming?: A Little Help from another person toileting, which includes using toliet, bedpan, or urinal?: A Little Help from another person bathing (including washing, rinsing, drying)?: A Little Help from another person to put on and taking off regular upper body clothing?: A Little Help from another person to put on and taking off regular lower body clothing?: A Little 6 Click Score: 19   End of Session Equipment Utilized During Treatment: Gait belt Nurse Communication: Mobility status  Activity Tolerance: Patient tolerated treatment well Patient left: in bed;with call bell/phone within reach;with bed alarm set;with family/visitor present;with nursing/sitter in room  OT Visit Diagnosis: Unsteadiness on feet (R26.81);Other abnormalities of gait and mobility (R26.89);Muscle weakness (generalized) (M62.81)                Time: 1546-1610 OT Time Calculation (min): 24 min Charges:  OT General Charges $OT Visit: 1 Visit OT Evaluation $OT Eval Low Complexity: 1 Low OT Treatments $Self Care/Home Management : 8-22 mins  Carver Fila, OTD, OTR/L SecureChat Preferred Acute Rehab [phone]   [name] 09/26/2022, 4:46 PM

## 2022-09-26 NOTE — TOC Initial Note (Signed)
Transition of Care [facility]) - Initial/Assessment Note    Patient Details  Name: [name] MRN: [id number] Date of Birth: 15-Jan-1946  Transition of Care [facility]) CM/SW Contact:    Gala Lewandowsky, RN Phone Number: 09/26/2022, 3:51 PM  Clinical Narrative: Patient presented for worsening lower extremity edema. PTA patient was from home with son. Daughter is at the bedside during the visit. Patient has DME rolling walker in the home. Daughter reports that the family takes the patient to appointments and she gets her medications without any issues. Daughter reports that patient is pretty independent in the home and usually cooks for herself and is able to complete all of her ADL's. Case Manager will continue to follow for transition of care needs as the patient progresses.                 Expected Discharge Plan: Home/Self Care Barriers to Discharge: Continued Medical Work up   Patient Goals and CMS Choice Patient states their goals for this hospitalization and ongoing recovery are:: plan to return home.   Expected Discharge Plan and Services In-house Referral: NA Discharge Planning Services: CM Consult   Living arrangements for the past 2 months: Single Family Home                   DME Agency: NA   Prior Living Arrangements/Services Living arrangements for the past 2 months: Single Family Home Lives with:: Adult Children Patient language and need for interpreter reviewed:: Yes Do you feel safe going back to the place where you live?: Yes      Need for Family Participation in Patient Care: Yes (Comment) Care giver support system in place?: Yes (comment) Current home services: DME (rolling walker) Criminal Activity/Legal Involvement Pertinent to Current Situation/Hospitalization: No - Comment as needed  Activities of Daily Living Home Assistive Devices/Equipment: None ADL Screening (condition at time of admission) Patient's cognitive ability adequate to safely complete  daily activities?: Yes Is the patient deaf or have difficulty hearing?: Yes Does the patient have difficulty seeing, even when wearing glasses/contacts?: No Does the patient have difficulty concentrating, remembering, or making decisions?: No Patient able to express need for assistance with ADLs?: No Does the patient have difficulty dressing or bathing?: No Independently performs ADLs?: Yes (appropriate for developmental age) Does the patient have difficulty walking or climbing stairs?: No Weakness of Legs: None Weakness of Arms/Hands: None  Permission Sought/Granted Permission sought to share information with : Family Supports, Case Manager   Emotional Assessment Appearance:: Appears stated age Attitude/Demeanor/Rapport: Unable to Assess Affect (typically observed): Unable to Assess   Alcohol / Substance Use: Not Applicable Psych Involvement: No (comment)  Admission diagnosis:  PAD (peripheral artery disease) (HCC) [I73.9] Acute on chronic systolic CHF (congestive heart failure) (HCC) [I50.23] Leukopenia, unspecified type [D72.819] Acute on chronic congestive heart failure, unspecified heart failure type [facility]) [I50.9] Patient Active Problem List   Diagnosis Date Noted   Acute on chronic systolic CHF (congestive heart failure) (HCC) 09/25/2022   PAD (peripheral artery disease) (HCC) 09/25/2022   Malnutrition of moderate degree 03/01/2022   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) 07/15/2019   Cystoid macular edema of left eye 05/26/2019   Moderate nonproliferative diabetic retinopathy of both eyes (HCC) 05/26/2019   Early stage nonexudative age-related macular degeneration of both eyes 05/26/2019   Hyperglycemia    CKD stage 3b, GFR 30-44 ml/min (HCC) 03/17/2016   Hyperlipidemia associated with type 2 diabetes mellitus (HCC)  COPD (chronic obstructive pulmonary disease) (HCC) 06/28/2015   Chronic systolic CHF  (congestive heart failure) (HCC) 06/28/2015   NICM (nonischemic cardiomyopathy) (HCC) 10/05/2012   Abdominal mass, LLQ (left lower quadrant) 05/03/2012   PULMONARY NODULE 08/05/2008   Palpitations 08/05/2008   Type 2 diabetes mellitus without complication, with long-term current use of insulin (HCC) 07/16/2008   Essential hypertension 07/16/2008   PCP:  Darrow Bussing, MD Pharmacy:   [facility] [address] [place] - [address] [place] [address] Phone: [phone] Fax: [phone]  [facility] [address] [place] [address] Phone: [phone] Fax: [phone]  [facility] [place], [place] - [address] [place] [address] Phone: [phone] Fax: [phone]  Social Determinants of Health (SDOH) Social History: SDOH Screenings   Food Insecurity: Patient Declined (09/25/2022)  Housing: Patient Declined (09/25/2022)  Transportation Needs: No Transportation Needs (09/25/2022)  Utilities: Patient Declined (09/25/2022)  Alcohol Screen: Low Risk  (09/25/2022)  Financial Resource Strain: Low Risk  (09/25/2022)  Tobacco Use: Medium Risk (09/24/2022)   SDOH Interventions: Transportation Interventions: Intervention Not Indicated Alcohol Usage Interventions: Intervention Not Indicated (Score <7) Financial Strain Interventions: Intervention Not Indicated Readmission Risk Interventions     No data to display

## 2022-09-26 NOTE — Progress Notes (Signed)
PROGRESS NOTE    [name]  ZOX:[id number] DOB: Jun 25, 1945 DOA: 09/24/2022 PCP: Darrow Bussing, MD  77/F with chronic systolic CHF, NICM EF 35%, CKD 3B, type 2 diabetes mellitus, COPD presented to the ED with progressive dyspnea on exertion and worsening lower extremity edema X1 month, despite compliance with diuretics, recently her Lasix dose has been increased to 80 Mg twice daily, she has gained 22 pounds fluid weight over the last few months.  In the ED vital signs stable, BNP> 4500, creatinine 1.6, albumin 3.0, hemoglobin 9.3, chest x-ray with mild pulmonary edema   Subjective: -Hypoglycemic this morning, had brief episode of increased dyspnea, given extra dose of Bumex  Assessment and Plan:  Acute on chronic systolic CHF -Echo with EF as low as 20% in 2018, had nonobstructive CAD by cath in 2017, EF improved to 50-55% and then down again to 35-40% on last echo, with grade 3 diastolic dysfunction and low normal RV -Lasix increased to 3 mg twice daily, weight down 5 LB from yesterday -Continue Coreg, Jardiance -Cards following, monitor I's/O, BMP in a.m. -Increase activity, PT eval  Iron deficiency anemia -Given IV iron 8/5, trend hemoglobin  CKD stage 3b,  -Relatively stable, monitor with diuresis  Essential hypertension Stable.  Continue on her Coreg, Entresto  Type 2 diabetes mellitus without complication, with long-term current use of insulin (HCC) -A1c is 10.9, started on glargine and Jardiance -AM hypoglycemia, glargine discontinued, continue Jardiance, will add meal coverage today -Patient is on 70/30 insulin at home.  PAD (peripheral artery disease) (HCC) Patient's had ABIs which showed peripheral arterial disease. -Continue aspirin, statin, follow-up with vascular surgery  Hyperlipidemia associated with type 2 diabetes mellitus (HCC) Continue Lipitor 40 mg daily.   DVT prophylaxis: Heparin subcutaneous Code Status: Full code Family Communication: None  present Disposition Plan: Home likely 2-3days  Consultants:    Procedures:   Antimicrobials:    Objective: Vitals:   09/26/22 0707 09/26/22 0807 09/26/22 0921 09/26/22 1008  BP: (!) 136/93  (!) 127/96   Pulse: 89   86  Resp: 20  16   Temp:   97.6 F (36.4 C)   TempSrc: Oral  Oral   SpO2: 100% 100%    Weight:      Height:        Intake/Output Summary (Last 24 hours) at 09/26/2022 1237 Last data filed at 09/26/2022 1000 Gross per 24 hour  Intake 782.98 ml  Output 2500 ml  Net -1717.02 ml   Filed Weights   09/24/22 2340 09/25/22 0319 09/25/22 0532  Weight: 58.1 kg 68.4 kg 66 kg    Examination:  Pleasant elderly female sitting up in bed, AAO x 3, little slow to answer questions initially, then improved HEENT: Positive JVD CVS: S1-S2, regular rhythm Lungs: Coarse breath sounds, trace expiratory wheezes Abdomen: Soft, nontender, bowel sounds present Remedies: 1-2+ edema Skin: No rashes Psychiatry:  Mood & affect appropriate.     Data Reviewed:   CBC: Recent Labs  Lab 09/25/22 0029 09/26/22 0644  WBC 3.1* 3.2*  NEUTROABS 1.3*  --   HGB 9.3* 10.5*  HCT 29.7* 33.7*  MCV 85.8 88.0  PLT 227 237   Basic Metabolic Panel: Recent Labs  Lab 09/25/22 0029 09/25/22 1029 09/26/22 0644  NA 135 136 133*  K 3.8 4.3 3.7  CL 100 99 95*  CO2 25 27 28   GLUCOSE 179* 122* 192*  BUN 32* 34* 34*  CREATININE 1.65* 1.58* 1.54*  CALCIUM 8.6* 8.4* 8.5*  GFR: Estimated Creatinine Clearance: 27.5 mL/min (A) (by C-G formula based on SCr of 1.54 mg/dL (H)). Liver Function Tests: Recent Labs  Lab 09/25/22 0029  AST 32  ALT 28  ALKPHOS 90  BILITOT 1.0  PROT 5.8*  ALBUMIN 3.0*   Recent Labs  Lab 09/25/22 0029  LIPASE 36   No results for input(s): "AMMONIA" in the last 168 hours. Coagulation Profile: No results for input(s): "INR", "PROTIME" in the last 168 hours. Cardiac Enzymes: No results for input(s): "CKTOTAL", "CKMB", "CKMBINDEX", "TROPONINI" in the last  168 hours. BNP (last 3 results) No results for input(s): "PROBNP" in the last 8760 hours. HbA1C: Recent Labs    09/25/22 0703 09/26/22 0703  HGBA1C 10.9* 11.0*   CBG: Recent Labs  Lab 09/25/22 2040 09/26/22 0526 09/26/22 0608 09/26/22 0609 09/26/22 0924  GLUCAP 92 54* 195* 176* 145*   Lipid Profile: Recent Labs    09/25/22 0703  CHOL 90  HDL 34*  LDLCALC 50  TRIG 32  CHOLHDL 2.6   Thyroid Function Tests: Recent Labs    09/25/22 0703  TSH 2.166   Anemia Panel: Recent Labs    09/25/22 0316  FERRITIN 36  TIBC 375  IRON 21*   Urine analysis:    Component Value Date/Time   COLORURINE YELLOW 09/25/2022 0316   APPEARANCEUR HAZY (A) 09/25/2022 0316   LABSPEC 1.010 09/25/2022 0316   PHURINE 5.0 09/25/2022 0316   GLUCOSEU >=500 (A) 09/25/2022 0316   HGBUR MODERATE (A) 09/25/2022 0316   BILIRUBINUR NEGATIVE 09/25/2022 0316   KETONESUR NEGATIVE 09/25/2022 0316   PROTEINUR 30 (A) 09/25/2022 0316   UROBILINOGEN 0.2 04/28/2012 1757   NITRITE NEGATIVE 09/25/2022 0316   LEUKOCYTESUR TRACE (A) 09/25/2022 0316   Sepsis Labs: @LABRCNTIP (procalcitonin:4,lacticidven:4)  ) Recent Results (from the past 240 hour(s))  Resp panel by RT-PCR (RSV, Flu A&B, Covid) Anterior Nasal Swab     Status: None   Collection Time: 09/25/22 12:03 AM   Specimen: Anterior Nasal Swab  Result Value Ref Range Status   SARS Coronavirus 2 by RT PCR NEGATIVE NEGATIVE Final   Influenza A by PCR NEGATIVE NEGATIVE Final   Influenza B by PCR NEGATIVE NEGATIVE Final    Comment: (NOTE) The Xpert Xpress SARS-CoV-2/FLU/RSV plus assay is intended as an aid in the diagnosis of influenza from Nasopharyngeal swab specimens and should not be used as a sole basis for treatment. Nasal washings and aspirates are unacceptable for Xpert Xpress SARS-CoV-2/FLU/RSV testing.  Fact Sheet for Patients: BloggerCourse.com  Fact Sheet for Healthcare  Providers: SeriousBroker.it  This test is not yet approved or cleared by the [place] FDA and has been authorized for detection and/or diagnosis of SARS-CoV-2 by FDA under an Emergency Use Authorization (EUA). This EUA will remain in effect (meaning this test can be used) for the duration of the COVID-19 declaration under Section 564(b)(1) of the Act, 21 U.S.C. section 360bbb-3(b)(1), unless the authorization is terminated or revoked.     Resp Syncytial Virus by PCR NEGATIVE NEGATIVE Final    Comment: (NOTE) Fact Sheet for Patients: BloggerCourse.com  Fact Sheet for Healthcare Providers: SeriousBroker.it  This test is not yet approved or cleared by the [place] FDA and has been authorized for detection and/or diagnosis of SARS-CoV-2 by FDA under an Emergency Use Authorization (EUA). This EUA will remain in effect (meaning this test can be used) for the duration of the COVID-19 declaration under Section 564(b)(1) of the Act, 21 U.S.C. section 360bbb-3(b)(1), unless the authorization is terminated  or revoked.  Performed at [facility], [address], [place], [place] [address]      Radiology Studies: DG CHEST PORT 1 VIEW  Result Date: 09/26/2022 CLINICAL DATA:  [id number] with shortness of breath, coughing, CHF. EXAM: PORTABLE CHEST 1 VIEW COMPARISON:  Portable chest yesterday at 12:04 a.m. FINDINGS: 6:02 a.m. The heart is enlarged. Today there is increased central vascular prominence and generalized increased interstitial consolidation consistent with mild interstitial edema. Small pleural effusions have also formed as well as perihilar haziness on the left which could be pneumonitis or asymmetric ground-glass edema. Remainder of the lungs are clear of focal opacities. The mediastinum is stable. The aortic arch is heavily calcified. No new osseous findings. IMPRESSION: 1. Increased central  vascular prominence and interstitial consolidation consistent with mild interstitial edema. 2. Small pleural effusions. 3. New left perihilar haziness which could be pneumonitis or asymmetric ground-glass edema. Electronically Signed   By: Almira Bar M.D.   On: 09/26/2022 06:14   ECHOCARDIOGRAM COMPLETE  Result Date: 09/25/2022    ECHOCARDIOGRAM REPORT   Patient Name:   LINDA VENTEICHER Date of Exam: 09/25/2022 Medical Rec #:  [id number]    Height:       63.0 in Accession #:    [id number]   Weight:       145.5 lb Date of Birth:  1945/12/03    BSA:          1.689 m Patient Age:    55 years     BP:           128/75 mmHg Patient Gender: F            HR:           71 bpm. Exam Location:  Inpatient Procedure: 2D Echo, Color Doppler and Cardiac Doppler Indications:    acute systolic chf  History:        Patient has prior history of Echocardiogram examinations, most                 recent 02/27/2022.  Sonographer:    Delcie Roch RDCS Referring Phys: [id number] ERIC CHEN IMPRESSIONS  1. Left ventricular ejection fraction, by estimation, is 30 to 35%. The left ventricle has moderately decreased function. The left ventricle demonstrates global hypokinesis. Left ventricular diastolic parameters are indeterminate.  2. Right ventricular systolic function is moderately reduced. The right ventricular size is moderately enlarged. There is moderately elevated pulmonary artery systolic pressure.  3. Left atrial size was moderately dilated.  4. Right atrial size was moderately dilated.  5. The mitral valve is degenerative. Mild to moderate mitral valve regurgitation.  6. The tricuspid valve is degenerative. Tricuspid valve regurgitation is severe.  7. The aortic valve is normal in structure. Aortic valve regurgitation is not visualized.  8. The inferior vena cava is dilated in size with <50% respiratory variability, suggesting right atrial pressure of 15 mmHg.  9. Cannot exclude a small PFO. Conclusion(s)/Recommendation(s): LVEF mildly  worsened compared to prior echo. FINDINGS  Left Ventricle: Left ventricular ejection fraction, by estimation, is 30 to 35%. The left ventricle has moderately decreased function. The left ventricle demonstrates global hypokinesis. The left ventricular internal cavity size was normal in size. There is no left ventricular hypertrophy. Left ventricular diastolic parameters are indeterminate. Right Ventricle: The right ventricular size is moderately enlarged. Right ventricular systolic function is moderately reduced. There is moderately elevated pulmonary artery systolic pressure. The tricuspid regurgitant velocity is 3.31 m/s, and with an  assumed right atrial pressure of 5 mmHg, the estimated right ventricular systolic pressure is 48.8 mmHg. Left Atrium: Left atrial size was moderately dilated. Right Atrium: Right atrial size was moderately dilated. Pericardium: There is no evidence of pericardial effusion. Mitral Valve: The mitral valve is degenerative in appearance. Mild to moderate mitral valve regurgitation. Tricuspid Valve: The tricuspid valve is degenerative in appearance. Tricuspid valve regurgitation is severe. Aortic Valve: The aortic valve is normal in structure. Aortic valve regurgitation is not visualized. Pulmonic Valve: The pulmonic valve was normal in structure. Pulmonic valve regurgitation is not visualized. Aorta: The aortic root and ascending aorta are structurally normal, with no evidence of dilitation. Venous: The inferior vena cava is dilated in size with less than 50% respiratory variability, suggesting right atrial pressure of 15 mmHg. IAS/Shunts: Cannot exclude a small PFO.  LEFT VENTRICLE PLAX 2D LVIDd:         4.50 cm     Diastology LVIDs:         3.80 cm     LV e' medial:    3.70 cm/s LV PW:         0.80 cm     LV E/e' medial:  28.6 LV IVS:        0.80 cm     LV e' lateral:   6.53 cm/s LVOT diam:     1.60 cm     LV E/e' lateral: 16.2 LV SV:         23 LV SV Index:   13 LVOT Area:     2.01 cm   LV Volumes (MOD) LV vol d, MOD A2C: 86.2 ml LV vol s, MOD A2C: 51.2 ml LV SV MOD A2C:     35.0 ml RIGHT VENTRICLE            IVC RV Basal diam:  2.80 cm    IVC diam: 2.20 cm RV S prime:     7.83 cm/s TAPSE (M-mode): 1.3 cm LEFT ATRIUM             Index        RIGHT ATRIUM           Index LA diam:        4.20 cm 2.49 cm/m   RA Area:     15.90 cm LA Vol (A2C):   70.0 ml 41.44 ml/m  RA Volume:   40.90 ml  24.21 ml/m LA Vol (A4C):   57.2 ml 33.86 ml/m LA Biplane Vol: 63.4 ml 37.53 ml/m  AORTIC VALVE LVOT Vmax:   67.70 cm/s LVOT Vmean:  43.300 cm/s LVOT VTI:    0.113 m  AORTA Ao Root diam: 2.60 cm Ao Asc diam:  3.10 cm MITRAL VALVE                TRICUSPID VALVE MV Area (PHT): 5.84 cm     TR Peak grad:   43.8 mmHg MV Decel Time: 130 msec     TR Vmax:        331.00 cm/s MR Peak grad: 64.6 mmHg MR Vmax:      402.00 cm/s   SHUNTS MV E velocity: 106.00 cm/s  Systemic VTI:  0.11 m MV A velocity: 27.10 cm/s   Systemic Diam: 1.60 cm MV E/A ratio:  3.91 [place] signed by Carolan Clines Signature Date/Time: 09/25/2022/9:38:22 AM    Final    DG Chest Portable 1 View  Result Date: 09/25/2022 CLINICAL DATA:  Cough. EXAM: PORTABLE CHEST 1  VIEW COMPARISON:  None Available. FINDINGS: The cardiac silhouette is enlarged and unchanged in size. Mild perihilar prominence of the pulmonary vasculature is seen. There is marked severity calcification of the aortic arch. Mild, diffuse, chronic appearing increased interstitial lung markings are seen. Mild atelectasis and/or infiltrate is noted within the left lung base. No pleural effusion or pneumothorax is identified. The visualized skeletal structures are unremarkable. IMPRESSION: 1. Cardiomegaly with mild pulmonary vascular congestion. 2. Mild left basilar atelectasis and/or infiltrate. Electronically Signed   By: Aram Candela M.D.   On: 09/25/2022 00:21     Scheduled Meds:  acetaZOLAMIDE  500 mg Oral BID   aspirin EC  81 mg Oral Daily   atorvastatin  40 mg  Oral Daily   carvedilol  3.125 mg Oral BID WC   dextrose       empagliflozin  10 mg Oral Daily   heparin  5,000 Units Subcutaneous Q8H   potassium chloride  40 mEq Oral BID   sacubitril-valsartan  1 tablet Oral BID   Continuous Infusions:  bumetanide (BUMEX) IV     ferric gluconate (FERRLECIT) IVPB 250 mg (09/26/22 1011)     LOS: 1 day    Time spent:    Zannie Cove, MD [facility]   09/26/2022, 12:37 PM

## 2022-09-26 NOTE — Evaluation (Signed)
Physical Therapy Evaluation Patient Details Name: [name] MRN: [id number] DOB: 1945/09/15 Today's Date: 09/26/2022  History of Present Illness  77 y.o. female presents to [facility] hospital on 09/24/2022 with worsening LE edema. PMH includes breast CA, CHF, CAD, DM, NICM.  Clinical Impression  Pt presents to PT with deficits in activity tolerance, gait, balance, strength. Pt benefits from UE support of rollator when mobilizing, appearing unsteady and reaching for support when standing initially without DME. Pt's tolerates household ambulation well, with stable sats and no reported DOE. Pt will benefit from frequent mobilization in an effort to restore independence in mobility. Pt has necessary DME at home.        If plan is discharge home, recommend the following: Assist for transportation;Help with stairs or ramp for entrance;Assistance with cooking/housework   Can travel by private vehicle        Equipment Recommendations None recommended by PT  Recommendations for Other Services       Functional Status Assessment Patient has had a recent decline in their functional status and demonstrates the ability to make significant improvements in function in a reasonable and predictable amount of time.     Precautions / Restrictions Precautions Precautions: Fall Restrictions Weight Bearing Restrictions: No      Mobility  Bed Mobility Overal bed mobility: Needs Assistance Bed Mobility: Supine to Sit     Supine to sit: Supervision, HOB elevated          Transfers Overall transfer level: Needs assistance Equipment used: Rollator (4 wheels) Transfers: Sit to/from Stand Sit to Stand: Supervision           General transfer comment: verbal cues for hand placement    Ambulation/Gait Ambulation/Gait assistance: Min guard Gait Distance (Feet): 120 Feet Assistive device: Rollator (4 wheels) Gait Pattern/deviations: Step-through pattern Gait velocity: functional Gait velocity  interpretation: 1.31 - 2.62 ft/sec, indicative of limited community ambulator   General Gait Details: slowed step-through gait  Stairs            Wheelchair Mobility     Tilt Bed    Modified Rankin (Stroke Patients Only)       Balance Overall balance assessment: Needs assistance Sitting-balance support: No upper extremity supported, Feet supported Sitting balance-Leahy Scale: Good     Standing balance support: Single extremity supported, Reliant on assistive device for balance Standing balance-Leahy Scale: Poor                               Pertinent Vitals/Pain Pain Assessment Pain Assessment: No/denies pain    Home Living Family/patient expects to be discharged to:: Private residence Living Arrangements: Alone Available Help at Discharge: Family;Available PRN/intermittently Type of Home: House Home Access: Stairs to enter Entrance Stairs-Rails: None Entrance Stairs-Number of Steps: 2   Home Layout: Two level;Able to live on main level with bedroom/bathroom Home Equipment: Rollator (4 wheels);Cane - single point      Prior Function Prior Level of Function : Independent/Modified Independent               ADLs Comments: assist for transportation     Hand Dominance   Dominant Hand: Right    Extremity/Trunk Assessment   Upper Extremity Assessment Upper Extremity Assessment: Overall WFL for tasks assessed    Lower Extremity Assessment Lower Extremity Assessment: Generalized weakness    Cervical / Trunk Assessment Cervical / Trunk Assessment: Normal  Communication   Communication: HOH  Cognition Arousal/Alertness:  Awake/alert Behavior During Therapy: WFL for tasks assessed/performed Overall Cognitive Status: Within Functional Limits for tasks assessed                                          General Comments General comments (skin integrity, edema, etc.): pt on 2L [place] upon PT arrival, weaned to room air with  sats in mid 90s throughout mobility. Pt denies DOE when mobilizing, intermittent coughing noted    Exercises     Assessment/Plan    PT Assessment Patient needs continued PT services  PT Problem List Decreased strength;Decreased activity tolerance;Decreased balance;Decreased mobility;Decreased knowledge of use of DME       PT Treatment Interventions DME instruction;Gait training;Stair training;Functional mobility training;Balance training;Patient/family education    PT Goals (Current goals can be found in the Care Plan section)  Acute Rehab PT Goals Patient Stated Goal: to go home PT Goal Formulation: With patient Time For Goal Achievement: 10/10/22 Potential to Achieve Goals: Good    Frequency Min 1X/week     Co-evaluation               AM-PAC PT "6 Clicks" Mobility  Outcome Measure Help needed turning from your back to your side while in a flat bed without using bedrails?: A Little Help needed moving from lying on your back to sitting on the side of a flat bed without using bedrails?: A Little Help needed moving to and from a bed to a chair (including a wheelchair)?: A Little Help needed standing up from a chair using your arms (e.g., wheelchair or bedside chair)?: A Little Help needed to walk in hospital room?: A Little Help needed climbing 3-5 steps with a railing? : A Lot 6 Click Score: 17    End of Session   Activity Tolerance: Patient tolerated treatment well Patient left: in chair;with call bell/phone within reach Nurse Communication: Mobility status PT Visit Diagnosis: Other abnormalities of gait and mobility (R26.89);Muscle weakness (generalized) (M62.81)    Time: 6578-4696 PT Time Calculation (min) (ACUTE ONLY): 28 min   Charges:   PT Evaluation $PT Eval Low Complexity: 1 Low   PT General Charges $$ ACUTE PT VISIT: 1 Visit         Arlyss Gandy, PT, DPT Acute Rehabilitation Office [phone]   [name] 09/26/2022, 12:22 PM

## 2022-09-26 NOTE — Inpatient Diabetes Management (Signed)
Inpatient Diabetes Program Recommendations  AACE/ADA: New Consensus Statement on Inpatient Glycemic Control (2015)  Target Ranges:  Prepandial:   less than 140 mg/dL      Peak postprandial:   less than 180 mg/dL (1-2 hours)      Critically ill patients:  140 - 180 mg/dL   Lab Results  Component Value Date   GLUCAP 147 (H) 09/26/2022   HGBA1C 11.0 (H) 09/26/2022    Latest Reference Range & Units 09/26/22 05:26 09/26/22 06:08 09/26/22 06:09 09/26/22 09:24 09/26/22 13:02  Glucose-Capillary 70 - 99 mg/dL 54 (L) 865 (H) 784 (H) 145 (H) 147 (H)  (L): Data is abnormally low (H): Data is abnormally high  Review of Glycemic Control  Diabetes history: DM2 Outpatient Diabetes medications: 70/30 25 units QAM, 70/30 12 units QPM Current orders for Inpatient glycemic control: Jardiance 10 mg daily, Novolog 3 units TID  meal coverage  Inpatient Diabetes Program Recommendations:   Spoke with patient to verify she has been taking her Novolin insulin 70/30 25 units am, 12 units pm. Patient states she has not been having any low blood sugars. Reviewed with patient progress of her A1c from 13.3 on 02/27/22 to currently 10.9 and has not required much insulin while in the hospital. Patient shared she doesn't like the food in the hospital and eating very little compared to what she eats @ home. New to jardiance.  Thank you, Billy Fischer. , RN, MSN, CDE  Diabetes Coordinator Inpatient Glycemic Control Team Team Pager [phone] (8am-5pm) 09/26/2022 2:05 PM

## 2022-09-27 DIAGNOSIS — I10 Essential (primary) hypertension: Secondary | ICD-10-CM | POA: Diagnosis not present

## 2022-09-27 DIAGNOSIS — N1832 Chronic kidney disease, stage 3b: Secondary | ICD-10-CM | POA: Diagnosis not present

## 2022-09-27 DIAGNOSIS — E1169 Type 2 diabetes mellitus with other specified complication: Secondary | ICD-10-CM | POA: Diagnosis not present

## 2022-09-27 DIAGNOSIS — I5023 Acute on chronic systolic (congestive) heart failure: Secondary | ICD-10-CM | POA: Diagnosis not present

## 2022-09-27 DIAGNOSIS — D509 Iron deficiency anemia, unspecified: Secondary | ICD-10-CM

## 2022-09-27 LAB — GLUCOSE, CAPILLARY
Glucose-Capillary: 189 mg/dL — ABNORMAL HIGH (ref 70–99)
Glucose-Capillary: 201 mg/dL — ABNORMAL HIGH (ref 70–99)
Glucose-Capillary: 202 mg/dL — ABNORMAL HIGH (ref 70–99)
Glucose-Capillary: 211 mg/dL — ABNORMAL HIGH (ref 70–99)
Glucose-Capillary: 227 mg/dL — ABNORMAL HIGH (ref 70–99)
Glucose-Capillary: 239 mg/dL — ABNORMAL HIGH (ref 70–99)
Glucose-Capillary: 302 mg/dL — ABNORMAL HIGH (ref 70–99)
Glucose-Capillary: 304 mg/dL — ABNORMAL HIGH (ref 70–99)

## 2022-09-27 MED ORDER — POTASSIUM CHLORIDE CRYS ER 10 MEQ PO TBCR
40.0000 meq | EXTENDED_RELEASE_TABLET | Freq: Once | ORAL | Status: AC
  Administered 2022-09-27: 40 meq via ORAL
  Filled 2022-09-27: qty 4

## 2022-09-27 MED ORDER — POTASSIUM CHLORIDE CRYS ER 20 MEQ PO TBCR
60.0000 meq | EXTENDED_RELEASE_TABLET | Freq: Two times a day (BID) | ORAL | Status: DC
  Administered 2022-09-27 – 2022-09-28 (×2): 60 meq via ORAL
  Filled 2022-09-27 (×2): qty 3

## 2022-09-27 MED ORDER — INSULIN ASPART 100 UNIT/ML IJ SOLN
0.0000 [IU] | Freq: Three times a day (TID) | INTRAMUSCULAR | Status: DC
  Administered 2022-09-27: 3 [IU] via SUBCUTANEOUS
  Administered 2022-09-28: 2 [IU] via SUBCUTANEOUS
  Administered 2022-09-28: 5 [IU] via SUBCUTANEOUS
  Administered 2022-09-28: 7 [IU] via SUBCUTANEOUS
  Administered 2022-09-29 (×2): 3 [IU] via SUBCUTANEOUS
  Administered 2022-09-30: 7 [IU] via SUBCUTANEOUS
  Administered 2022-09-30: 5 [IU] via SUBCUTANEOUS
  Administered 2022-09-30: 3 [IU] via SUBCUTANEOUS
  Administered 2022-10-01: 2 [IU] via SUBCUTANEOUS
  Administered 2022-10-01: 5 [IU] via SUBCUTANEOUS
  Administered 2022-10-01: 2 [IU] via SUBCUTANEOUS
  Administered 2022-10-02 (×2): 5 [IU] via SUBCUTANEOUS

## 2022-09-27 NOTE — Hospital Course (Addendum)
[name] was admitted to the hospital with the working diagnosis of heart failure exacerbation.   76/F with chronic systolic CHF, NICM EF 35%, CKD 3B, type 2 diabetes mellitus, COPD presented to the ED with progressive dyspnea on exertion and worsening lower extremity edema X1 month, despite compliance with diuretics, recently her Lasix dose has been increased to 80 Mg twice daily, she has gained 22 pounds fluid weight over the last few months. In the ED her blood pressure was 126/77, HR 66, RR 25 and 02 saturation 99%, heart with S1 and S2 present and regular, positive JVD, respiratory with rales bilaterally, with no wheezing, abdomen with no distention and positive lower extremity edema.   Na 135, K 3,9 Cl 100, bicarbonate 25, glucose 100, bun 32 cr 1,65  AST 32, ALT 28  BNP >4,500  High sensitive troponin 1,538 and 1,438  Wbc 3,1 hgb 9,3 plt 227  Urine analysis SG 1,010, protein 30, glucose > 500, moderate hgb and trace leukocytes.   Chest radiograph with cardiomegaly with bilateral hilar vascular congestion with no infiltrates or effusions.   EKG 74 bpm, left axis deviation, normal intervals, sinus rhythm with poor R R wave progression, positive PVCs, with no significant ST segment or T wave changes.   Patient was placed on aggressive diuresis with IV bumetanide.  After a prolonged hospitalization her fluid balance has improved.  Plan to follow up with cardiology and primary care as outpatient.

## 2022-09-27 NOTE — Assessment & Plan Note (Addendum)
Echocardiogram with reduced LV systolic function EF 30 to 35%, global hypokinesis, no LVH, RV systolic function with moderate reduction,  RV with moderate dilatation, RVSP 48.8, LA and RA with moderate dilatation, mild to moderate MR, severe TR.   Urine output 4,000  ml Systolic blood pressure 115  to 119 mmHg. Telemetry with occasional PVC and PAC.   Plan to continue diuresis with IV bumetanide 2 mg bid.  Augment diuresis with acetazolamide.  Empagliflozin,spironolactone and carvedilol.  Entresto. Add ted hose

## 2022-09-27 NOTE — Progress Notes (Addendum)
Rounding Note    Patient Name: [name] Date of Encounter: 09/27/2022  [facility] Cardiologist: Bryan Lemma, MD   Subjective   No cough today during inhalations.  Continues to improve each day and looking much stronger and feeling better.  Ambulated well yesterday and feels like she is close to her baseline.  She is off supplemental oxygen.  Inpatient Medications    Scheduled Meds:  acetaZOLAMIDE  500 mg Oral BID   aspirin EC  81 mg Oral Daily   atorvastatin  40 mg Oral Daily   carvedilol  3.125 mg Oral BID WC   empagliflozin  10 mg Oral Daily   heparin  5,000 Units Subcutaneous Q8H   insulin aspart  3 Units Subcutaneous TID WC   potassium chloride  40 mEq Oral BID   sacubitril-valsartan  1 tablet Oral BID   Continuous Infusions:  bumetanide (BUMEX) IV 3 mg (09/26/22 1721)   ferric gluconate (FERRLECIT) IVPB 250 mg (09/26/22 1011)   PRN Meds: acetaminophen **OR** acetaminophen, guaiFENesin-dextromethorphan, melatonin, ondansetron **OR** ondansetron (ZOFRAN) IV   Vital Signs    Vitals:   09/26/22 2006 09/27/22 0000 09/27/22 0400 09/27/22 0443  BP: 135/72   123/71  Pulse:    72  Resp: 17   18  Temp: 98.5 F (36.9 C)   98.2 F (36.8 C)  TempSrc: Oral   Oral  SpO2:  100% 100%   Weight:    58.5 kg  Height:        Intake/Output Summary (Last 24 hours) at 09/27/2022 0747 Last data filed at 09/27/2022 0717 Gross per 24 hour  Intake 0 ml  Output 36644 ml  Net -13500 ml      09/27/2022    4:43 AM 09/25/2022    5:32 AM 09/25/2022    3:19 AM  Last 3 Weights  Weight (lbs) 128 lb 15.5 oz 145 lb 8.1 oz 150 lb 12.8 oz  Weight (kg) 58.5 kg 66 kg 68.402 kg      Telemetry    Normal sinus rhythm heart rate in the 60s.  Frequent ectopy and PVCs/PACs.- Personally Reviewed  ECG    No new tracings- Personally Reviewed  Physical Exam   GEN: No acute distress.   Neck: No JVD Cardiac: RRR, no murmurs, rubs, or gallops.  Respiratory: Coarse breath sounds  more in the middle to lower lobes, much improved though.  GI: Soft, nontender, non-distended  MS: 1-2+ pitting edema, now more distally at the ankles. Neuro:  Nonfocal  Psych: Normal affect   Labs    High Sensitivity Troponin:   Recent Labs  Lab 09/25/22 0029 09/25/22 0132  TROPONINIHS 1,538* 1,438*     Chemistry Recent Labs  Lab 09/25/22 0029 09/25/22 1029 09/26/22 0644 09/26/22 1805 09/27/22 0222  NA 135   < > 133* 132* 133*  K 3.8   < > 3.7 3.3* 3.2*  CL 100   < > 95* 96* 97*  CO2 25   < > 28 25 24   GLUCOSE 179*   < > 192* 172* 240*  BUN 32*   < > 34* 34* 36*  CREATININE 1.65*   < > 1.54* 1.75* 1.73*  CALCIUM 8.6*   < > 8.5* 8.4* 8.2*  MG  --   --   --   --  2.0  PROT 5.8*  --   --   --   --   ALBUMIN 3.0*  --   --   --   --  AST 32  --   --   --   --   ALT 28  --   --   --   --   ALKPHOS 90  --   --   --   --   BILITOT 1.0  --   --   --   --   GFRNONAA 32*   < > 34* 29* 30*  ANIONGAP 10   < > 10 11 12    < > = values in this interval not displayed.    Lipids  Recent Labs  Lab 09/25/22 0703  CHOL 90  TRIG 32  HDL 34*  LDLCALC 50  CHOLHDL 2.6    Hematology Recent Labs  Lab 09/25/22 0029 09/26/22 0644 09/27/22 0222  WBC 3.1* 3.2* 3.2*  RBC 3.46* 3.83* 3.20*  HGB 9.3* 10.5* 8.6*  HCT 29.7* 33.7* 27.3*  MCV 85.8 88.0 85.3  MCH 26.9 27.4 26.9  MCHC 31.3 31.2 31.5  RDW 18.0* 18.0* 17.9*  PLT 227 237 207   Thyroid  Recent Labs  Lab 09/25/22 0703  TSH 2.166    BNP Recent Labs  Lab 09/25/22 0029 09/26/22 0644  BNP >4,500.0* 4,141.7*    DDimer No results for input(s): "DDIMER" in the last 168 hours.   Radiology    DG CHEST PORT 1 VIEW  Result Date: 09/26/2022 CLINICAL DATA:  [id number] with shortness of breath, coughing, CHF. EXAM: PORTABLE CHEST 1 VIEW COMPARISON:  Portable chest yesterday at 12:04 a.m. FINDINGS: 6:02 a.m. The heart is enlarged. Today there is increased central vascular prominence and generalized increased interstitial  consolidation consistent with mild interstitial edema. Small pleural effusions have also formed as well as perihilar haziness on the left which could be pneumonitis or asymmetric ground-glass edema. Remainder of the lungs are clear of focal opacities. The mediastinum is stable. The aortic arch is heavily calcified. No new osseous findings. IMPRESSION: 1. Increased central vascular prominence and interstitial consolidation consistent with mild interstitial edema. 2. Small pleural effusions. 3. New left perihilar haziness which could be pneumonitis or asymmetric ground-glass edema. Electronically Signed   By: Almira Bar M.D.   On: 09/26/2022 06:14   ECHOCARDIOGRAM COMPLETE  Result Date: 09/25/2022    ECHOCARDIOGRAM REPORT   Patient Name:   [name] Date of Exam: 09/25/2022 Medical Rec #:  [id number]    Height:       63.0 in Accession #:    [id number]   Weight:       145.5 lb Date of Birth:  [birth year]    BSA:          1.689 m Patient Age:    77 years     BP:           128/75 mmHg Patient Gender: F            HR:           71 bpm. Exam Location:  Inpatient Procedure: 2D Echo, Color Doppler and Cardiac Doppler Indications:    acute systolic chf  History:        Patient has prior history of Echocardiogram examinations, most                 recent 02/27/2022.  Sonographer:    Delcie Roch RDCS Referring Phys: [phone] ERIC CHEN IMPRESSIONS  1. Left ventricular ejection fraction, by estimation, is 30 to 35%. The left ventricle has moderately decreased function. The left ventricle demonstrates global hypokinesis. Left ventricular diastolic parameters are  indeterminate.  2. Right ventricular systolic function is moderately reduced. The right ventricular size is moderately enlarged. There is moderately elevated pulmonary artery systolic pressure.  3. Left atrial size was moderately dilated.  4. Right atrial size was moderately dilated.  5. The mitral valve is degenerative. Mild to moderate mitral valve regurgitation.   6. The tricuspid valve is degenerative. Tricuspid valve regurgitation is severe.  7. The aortic valve is normal in structure. Aortic valve regurgitation is not visualized.  8. The inferior vena cava is dilated in size with <50% respiratory variability, suggesting right atrial pressure of 15 mmHg.  9. Cannot exclude a small PFO. Conclusion(s)/Recommendation(s): LVEF mildly worsened compared to prior echo. FINDINGS  Left Ventricle: Left ventricular ejection fraction, by estimation, is 30 to 35%. The left ventricle has moderately decreased function. The left ventricle demonstrates global hypokinesis. The left ventricular internal cavity size was normal in size. There is no left ventricular hypertrophy. Left ventricular diastolic parameters are indeterminate. Right Ventricle: The right ventricular size is moderately enlarged. Right ventricular systolic function is moderately reduced. There is moderately elevated pulmonary artery systolic pressure. The tricuspid regurgitant velocity is 3.31 m/s, and with an assumed right atrial pressure of 5 mmHg, the estimated right ventricular systolic pressure is 48.8 mmHg. Left Atrium: Left atrial size was moderately dilated. Right Atrium: Right atrial size was moderately dilated. Pericardium: There is no evidence of pericardial effusion. Mitral Valve: The mitral valve is degenerative in appearance. Mild to moderate mitral valve regurgitation. Tricuspid Valve: The tricuspid valve is degenerative in appearance. Tricuspid valve regurgitation is severe. Aortic Valve: The aortic valve is normal in structure. Aortic valve regurgitation is not visualized. Pulmonic Valve: The pulmonic valve was normal in structure. Pulmonic valve regurgitation is not visualized. Aorta: The aortic root and ascending aorta are structurally normal, with no evidence of dilitation. Venous: The inferior vena cava is dilated in size with less than 50% respiratory variability, suggesting right atrial pressure of 15  mmHg. IAS/Shunts: Cannot exclude a small PFO.  LEFT VENTRICLE PLAX 2D LVIDd:         4.50 cm     Diastology LVIDs:         3.80 cm     LV e' medial:    3.70 cm/s LV PW:         0.80 cm     LV E/e' medial:  28.6 LV IVS:        0.80 cm     LV e' lateral:   6.53 cm/s LVOT diam:     1.60 cm     LV E/e' lateral: 16.2 LV SV:         23 LV SV Index:   13 LVOT Area:     2.01 cm  LV Volumes (MOD) LV vol d, MOD A2C: 86.2 ml LV vol s, MOD A2C: 51.2 ml LV SV MOD A2C:     35.0 ml RIGHT VENTRICLE            IVC RV Basal diam:  2.80 cm    IVC diam: 2.20 cm RV S prime:     7.83 cm/s TAPSE (M-mode): 1.3 cm LEFT ATRIUM             Index        RIGHT ATRIUM           Index LA diam:        4.20 cm 2.49 cm/m   RA Area:     15.90 cm LA Vol (  A2C):   70.0 ml 41.44 ml/m  RA Volume:   40.90 ml  24.21 ml/m LA Vol (A4C):   57.2 ml 33.86 ml/m LA Biplane Vol: 63.4 ml 37.53 ml/m  AORTIC VALVE LVOT Vmax:   67.70 cm/s LVOT Vmean:  43.300 cm/s LVOT VTI:    0.113 m  AORTA Ao Root diam: 2.60 cm Ao Asc diam:  3.10 cm MITRAL VALVE                TRICUSPID VALVE MV Area (PHT): 5.84 cm     TR Peak grad:   43.8 mmHg MV Decel Time: 130 msec     TR Vmax:        331.00 cm/s MR Peak grad: 64.6 mmHg MR Vmax:      402.00 cm/s   SHUNTS MV E velocity: 106.00 cm/s  Systemic VTI:  0.11 m MV A velocity: 27.10 cm/s   Systemic Diam: 1.60 cm MV E/A ratio:  3.91 [place] signed by Carolan Clines Signature Date/Time: 09/25/2022/9:38:22 AM    Final     Cardiac Studies   Echocardiogram 02/27/2022  1. Left ventricular ejection fraction, by estimation, is 35 to 40%. The  left ventricle has moderately decreased function. The left ventricle  demonstrates global hypokinesis. The left ventricular internal cavity size  was moderately dilated. Left  ventricular diastolic parameters are consistent with Grade III diastolic  dysfunction (restrictive).   2. Right ventricular systolic function is low normal. The right  ventricular size is moderately  enlarged. There is mildly elevated  pulmonary artery systolic pressure. The estimated right ventricular  systolic pressure is 39.1 mmHg.   3. Left atrial size was moderately dilated.   4. Right atrial size was moderately dilated.   5. The mitral valve is degenerative. Mild mitral valve regurgitation. No  evidence of mitral stenosis.   6. The aortic valve is tricuspid. Aortic valve regurgitation is not  visualized. No aortic stenosis is present.   7. The inferior vena cava is normal in size with <50% respiratory  variability, suggesting right atrial pressure of 8 mmHg.   8. Evidence of atrial level shunting detected by color flow Doppler.  There is a small patent foramen ovale with predominantly left to right  shunting across the atrial septum.   Comparison(s): The left ventricular function is worsened. Prior EF 50-55%.   Patient Profile     77 y.o. female with history of DM type II, hypertension, hyperlipidemia, CKD, COPD, chronic HFrEF with an EF of 35 to 40% in January 2024, bilateral PAD.  Patient admitted for worsening symptoms of peripheral edema, cough, shortness of breath, significant weight gain 25+ pounds.  Patient with history of reduced EF as low as 20% in 2018 thought likely nonischemic due to nonobstructive CAD noted on catheterization in 2018. She has had labile EF over the years increased to 50 to 55% 09/2020, however most recent echo in January 2024 showed EF 35 to 40% with mild RV dysfunction.  Grade 3 diastolic dysfunction.  RVSP 39.  Assessment & Plan    Acute on chronic HFrEF Weight on admission 150.8.  Patient reports baseline weight to being around 125.  Echocardiogram during this admission shows mildy reduced EF of 30 to 35% compared to prior study in January.  She has moderately reduced RV function.  Weight 128. Diuresis has slowed however she still continues to put out 3.7 L and still has signs of congestion that are improving.  She has 1-2+ pitting edema distally  in her ankles that has improved as well, but still as coarse breath sounds. Maybe 1 more day of diuresis. No longer on supplemental oxygen, no cough today, and ambulating well. Once sufficiently diuresed plan to discontinue acetazolamide and add on spironolactone if renal function stable. Likely will need high-dose oral Bumex or torsemide at discharge since she did not respond well with high-dose Lasix.  Also consider CRT/ICD. Continue acetazolamide 500 mg twice daily, Bumex 2 mg twice daily, carvedilol 3.125 mg twice daily, Jardiance 10 mg Entresto 24-26 mg twice daily.  Seems to be compensating decently right now.  Will continue to monitor if antihypertensives need to be titrated back or discontinued.  Blood pressures have been stable. Some concern for poor CO initially. Perfusing well today and warm. Titrate GDMT once sufficiently diuresed.  Hypokalemia Potassium 4.2.  Will supplement with 60 mEq twice daily during aggressive diuresis.  Nonobstructive CAD Elevated troponins 1538, 1438 History of nonobstructive disease on cardiac catheterization in 2017.  Still considering catherization. Continue aspirin and atorvastatin 40 mg.  LDL 50.  CKD stage III Creatinine seems stable here,  1.65 on admission.  Currently 1.54>1.73>1.59.  Baseline unclear however seems to range between 1.2-1.8 on ferrilecit.   Normal sinus rhythm with frequent PVCs and ectopy Could be wall tension from hypervolemia causing ectopy.  Ectopy has improved and less frequent today. Still considering peri discharge right heart cath.  For questions or updates, please contact [facility] Please consult [email or web address] for contact info under        Signed, Abagail Kitchens, PA-C  09/27/2022, 7:47 AM     Personally seen and examined. Agree with APP above with the following comments:  Patient notes that she feels much better She is now off oxygen.  No CP, SOB, Palpitations. Ectopy has improved  Exam notable for  warm LE bilaterally, +2 edema.  JVD still elevated.  Labs notable for Creatinine 1.59 Tele: SR with PVCs and PACs, improved from 09/28/22  Would recommend  - stopping acetazolamide and decreasing bumex. - if continues to improve like this will defer ischemic eval to outpatient and focus no titration of GDMT.  Riley Lam, MD [facility] Cardiologist [facility]  [address] [place], [address] [place], [place] [address] [phone]  10:24 AM

## 2022-09-27 NOTE — Assessment & Plan Note (Addendum)
Serum iron 21, TIBC 375, transferrin saturation 6, ferritin 36. SP IV iron infusion.

## 2022-09-27 NOTE — Progress Notes (Signed)
   09/27/22 1707  Mobility  Activity Ambulated with assistance in hallway  Level of Assistance Contact guard assist, steadying assist  Assistive Device Front wheel walker  Distance Ambulated (ft) 100 ft  Activity Response Tolerated fair  Mobility Referral Yes  $Mobility charge 1 Mobility  Mobility Specialist Start Time (ACUTE ONLY) 1557  Mobility Specialist Stop Time (ACUTE ONLY) 1614  Mobility Specialist Time Calculation (min) (ACUTE ONLY) 17 min   Mobility Specialist: Progress Note   Pt received in chair, agreeable to mobility session. During ambulation, noticed pt taking one hop-skip step, no LOB, c/o leg weakness during ambulation. After session, pt returned to chair with all needs met, call bell within reach.   Barnie Mort Mobility Specialist Please contact via SecureChat or Rehab office at [phone]

## 2022-09-27 NOTE — Progress Notes (Signed)
  Progress Note   Patient: [name] MWU:[id number] DOB: 08-21-1945 DOA: 09/24/2022     2 DOS: the patient was seen and examined on 09/27/2022   Brief hospital course: Mrs. Sura was admitted to the hospital with the working diagnosis of heart failure exacerbation.   76/F with chronic systolic CHF, NICM EF 35%, CKD 3B, type 2 diabetes mellitus, COPD presented to the ED with progressive dyspnea on exertion and worsening lower extremity edema X1 month, despite compliance with diuretics, recently her Lasix dose has been increased to 80 Mg twice daily, she has gained 22 pounds fluid weight over the last few months. In the ED vital signs stable, BNP> 4500, creatinine 1.6, albumin 3.0, hemoglobin 9.3, chest x-ray with mild pulmonary edema   Assessment and Plan: * Acute on chronic systolic CHF (congestive heart failure) (HCC) Echocardiogram with reduced LV systolic function EF 30 to 35%, global hypokinesis, no LVH, RV systolic function with moderate reduction,  RV with moderate dilatation, RVSP 48.8, LA and RA with moderate dilatation, mild to moderate MR, severe TR.   Urine output 13,100 ml Systolic blood pressure 123 to 134 mmHg.  Plan to continue diuresis with IV bumetanide 3 mg bid.  Augment diuresis with acetazolamide.  Empagliflozin and carvedilol.  Entresto.  CKD stage 3b, GFR 30-44 ml/min (HCC) Renal function with serum cr at 1,73 with K at 3,2 and serum bicarbonate at 24.  Na 133 Mg 2,o  Plan to add Kcl for K correction, 60 meq x 2  Follow up renal function in am.  Avoid hypotension or nephrotoxic medications.   Essential hypertension Continue blood pressure control with entresto and carvedilol.  Diuresis with bumetanide.   Type 2 diabetes mellitus with hyperlipidemia (HCC) Uncontrolled T2Dm with hyperglycemia.   Plan to continue glucose cover and monitoring with insulin sliding scale.  Patient is tolerating po well.   Continue with statin.   PAD (peripheral artery disease)  (HCC) Continue blood pressure control. Continue statin and aspirin.   Iron deficiency anemia Serum iron 21, TIBC 375, transferrin saturation 6, ferritin 36. IV iron infusion today.         Subjective: Patient with improvement in dyspnea, no chest pain, continue to have edema   Physical Exam: Vitals:   09/27/22 0443 09/27/22 0809 09/27/22 0924 09/27/22 1300  BP: 123/71 132/61  134/66  Pulse: 72 (!) 59 69 63  Resp: 18 16  18   Temp: 98.2 F (36.8 C) 98.6 F (37 C)  98.6 F (37 C)  TempSrc: Oral Oral  Oral  SpO2:  100%  95%  Weight: 58.5 kg     Height:       Neurology awake and alert ENT with mild pallor Cardiovascular with S1 and S2 present and regular, positive systolic murmur right lower sternal border and apex Moderate JVD Positive lower extremity edema ++ pitting bilaterally  Respiratory with no scattered rales and rhonchi bilaterally with no wheezing Abdomen with no distention  Data Reviewed:    Family Communication: no family at the bedside   Disposition: Status is: Inpatient Remains inpatient appropriate because: diuresis   Planned Discharge Destination: Home    Author: Coralie Keens, MD 09/27/2022 2:14 PM  For on call review [email or web address].

## 2022-09-27 NOTE — Progress Notes (Deleted)
   Cardiology Clinic Note   Date: 09/27/2022 ID: Kayra, Comeaux 14-Aug-1945, MRN [id number]  Primary Cardiologist:  Bryan Lemma, MD  Patient Profile    [name] is a 77 y.o. female who presents to the clinic today for ***    Past medical history significant for: Nonobstructive CAD. LHC 12/20/2015 (abnormal stress test): Distal LAD 15%.  Mid LCx 30%.  Mid RCA 40%. Chronic systolic heart failure/nonischemic heart failure. Echo 09/25/2022: EF 30-35%. Global hypokinesis. Diastolic parameters are indeterminate. Moderately reduced  RV function. Moderate RVH. Moderately elevated PA pressure.  Moderate BAE.  Mild to moderate MR.  Severe TR.  Dilated IVC, RA pressure 15 mmHg.  Cannot exclude a small PFO. PAD. Vascular lower extremity ultrasound/ABI 08/04/2022: Right: 50 to 74% mid/distal CFA, 30 to 49% ostial SFA (low end range), 30 to 49% mid SFA (high end range close).  Left: 30 to 49% proximal SFA distal to ostium, 50 to 74% mid SFA (high end range), 30 to 49% above the knee popliteal artery (low end range). Palpitations.  48-hour Holter 05/26/2016: PVCs 5.82%.  Supraventricular ectopy 0.15%. Hypertension.  COPD. T2DM. CKD stage IIIb.     History of Present Illness    [name] ***   Today, patient ***   ROS: All other systems reviewed and are otherwise negative except as noted in History of Present Illness.  Studies Reviewed       ***  Risk Assessment/Calculations    {Does this patient have ATRIAL FIBRILLATION?:[phone]}          Physical Exam    VS:  There were no vitals taken for this visit. , BMI There is no height or weight on file to calculate BMI.  GEN: Well nourished, well developed, in no acute distress. Neck: No JVD or carotid bruits. Cardiac: *** RRR. No murmurs. No rubs or gallops.   Respiratory:  Respirations regular and unlabored. Clear to auscultation without rales, wheezing or rhonchi. GI: Soft, nontender, nondistended. Extremities: Radials/DP/PT  2+ and equal bilaterally. No clubbing or cyanosis. No edema ***  Skin: Warm and dry, no rash. Neuro: Strength intact.  Assessment & Plan   ***  Disposition: ***     {Are you ordering a CV Procedure (e.g. stress test, cath, DCCV, TEE, etc)?   Press F2        :[id number]}   Signed, Etta Grandchild. , DNP, NP-C

## 2022-09-27 NOTE — Inpatient Diabetes Management (Signed)
Inpatient Diabetes Program Recommendations  AACE/ADA: New Consensus Statement on Inpatient Glycemic Control   Target Ranges:  Prepandial:   less than 140 mg/dL      Peak postprandial:   less than 180 mg/dL (1-2 hours)      Critically ill patients:  140 - 180 mg/dL    Latest Reference Range & Units 09/26/22 05:26 09/26/22 06:08 09/26/22 06:09 09/26/22 09:24 09/26/22 13:02 09/26/22 16:13 09/26/22 20:11 09/27/22 00:34 09/27/22 04:08 09/27/22 08:11  Glucose-Capillary 70 - 99 mg/dL 54 (L) 469 (H) 629 (H) 145 (H) 147 (H) 133 (H) 211 (H) 239 (H) 227 (H) 211 (H)   Review of Glycemic Control  Diabetes history: DM2 Outpatient Diabetes medications: 70/30 25 units QAM, 70/30 12 units QPM Current orders for Inpatient glycemic control: Novolog 3 units TID with meals, Jardiance 10 mg daily   Inpatient Diabetes Program Recommendations:    Insulin: Please consider ordering Novolog 0-9 units TID with meals and Novolog 0-5 units at bedtime.  NOTE: Patient was ordered Semglee 10 units daily and Novolog correction insulin. Patient had hypoglycemia on 8/5 and on 09/26/22 and noted Semglee and Novolog correction insulin were discontinued on 09/26/22. Would recommend re-ordering Novolog correction scale to help get glucose under better control.  Thanks, Orlando Penner, RN, MSN, CDCES Diabetes Coordinator Inpatient Diabetes Program [phone] (Team Pager from 8am to 5pm)

## 2022-09-27 NOTE — Progress Notes (Signed)
Heart Failure Stewardship Pharmacist Progress Note   PCP: Darrow Bussing, MD PCP-Cardiologist: Bryan Lemma, MD    HPI:  77 yo F with PMH of HFrEF, T2DM, HTN, HLD, CKD, COPD, and PAD.  Admitted 11/2015 with A/C Systolic HF. Echo at that time with EF 40-45%. Cath during that admission with mild to moderate, but non-obstructive CAD including 15% distal LAD stenosis, 30% ulcerated mid LCx disease, and 40% mid RCA stenosis. Followe by the advanced heart failure team until 11/2016 when EF recovered to 55-60%.   She was admitted 09/2020 with acute on chronic HF in the setting of dietary noncompliance. EF was 50-55%. She was scheduled to follow up with HF TOC and cancelled her appt.   Admitted 02/2022 with AKI, RSV, and acute HF. BNP >4500. EF back down 35-40%. Patient was restarted on Entresto and continued on carvedilol with outpatient consideration of MRA and SGLT2i once recovered form RSV.   Presented to the ED on 8/4 with LE edema, shortness of breath, weight gain cough, N/D, fatigue, and body aches. She states over the last two weeks she has had progression of these symptoms. In the ED trop 9604>5409, BNP >4500. CXR with mild pulmonary vascular congestion. Repeat ECHO with LVEF 30-35%, moderately reduced RV, mild-mod MR, and severe TR. Possible cath before discharge.   Current HF Medications: Diuretic: bumetanide 3 mg IV BID Beta Blocker: carvedilol 3.125 mg BID ACE/ARB/ARNI: Entresto 24/26 mg BID SGLT2i: Jardiance 10 mg daily Other: Ferrlecit 250 mg IV daily x 4 doses  Prior to admission HF Medications: Diuretic: furosemide 40 mg BID Beta blocker: carvedilol 3.125 mg BID ACE/ARB/ARNI: Entresto 49/51 mg BID  Pertinent Lab Values: Serum creatinine 1.73, BUN 36, Potassium 3.2, Sodium 133, BNP >4500, A1c 10.9 (improved from 13.3 in January)   Vital Signs: Weight: 128 lbs (admission weight: 145 lbs) Blood pressure: 120-130/70s  Heart rate: 50-60s  I/O: net -4.9L yesterday, net  -13.6L since admission  Medication Assistance / Insurance Benefits Check: Does the patient have prescription insurance? No  Does the patient qualify for medication assistance through manufacturers or grants?   Yes Eligible grants and/or patient assistance programs: Valla Leaver Medication assistance applications in progress: Jardiance  Medication assistance applications approved: Entresto Approved medication assistance renewals will be completed by: [facility]  Outpatient Pharmacy:  Prior to admission outpatient pharmacy: [facility] Is the patient willing to use [facility] TOC pharmacy at discharge? Yes Is the patient willing to transition their outpatient pharmacy to utilize a [facility] outpatient pharmacy?   No    Assessment: 1. Acute on chronic systolic CHF (LVEF 35-40%). NYHA class III symptoms. - Continue bumetanide 3 mg IV BID. Positive response to increased bumex dose. Strict I/Os and daily weights. Keep K>4 and Mg>2. KCl 40 mEq BID ordered for replacement.  - Continue carvedilol 3.125 mg BID  - Continue Entresto 24/26 mg BID, consider increasing back to 49/51 mg prior to discharge - Consider starting MRA prior to discharge once renal function stable - Continue Jardiance 10 mg daily - patient assistance initiated - Continue Ferrlecit 250 mg IV daily x 4 doses   Plan: 1) Medication changes recommended at this time: - Continue IV diuresis  2) Patient assistance: - Already receives [employer] from [employer] patient assistance - Landscape architect for [employer] patient assistance initiated. Patient portion completed and signed.   3)  Education  - Patient has been educated on current HF medications and potential additions to HF medication regimen - Patient verbalizes understanding that over the next few  months, these medication doses may change and more medications may be added to optimize HF regimen - Patient has been educated on basic disease state pathophysiology and goals of  therapy   Sharen Hones, PharmD, BCPS Heart Failure Stewardship Pharmacist Phone [phone]

## 2022-09-28 ENCOUNTER — Telehealth (HOSPITAL_COMMUNITY): Payer: Self-pay

## 2022-09-28 ENCOUNTER — Ambulatory Visit: Payer: Medicare Other | Admitting: Student

## 2022-09-28 DIAGNOSIS — I5023 Acute on chronic systolic (congestive) heart failure: Secondary | ICD-10-CM | POA: Diagnosis not present

## 2022-09-28 DIAGNOSIS — I10 Essential (primary) hypertension: Secondary | ICD-10-CM | POA: Diagnosis not present

## 2022-09-28 DIAGNOSIS — N1832 Chronic kidney disease, stage 3b: Secondary | ICD-10-CM | POA: Diagnosis not present

## 2022-09-28 DIAGNOSIS — E1169 Type 2 diabetes mellitus with other specified complication: Secondary | ICD-10-CM | POA: Diagnosis not present

## 2022-09-28 LAB — BASIC METABOLIC PANEL
Anion gap: 7 (ref 5–15)
BUN: 38 mg/dL — ABNORMAL HIGH (ref 8–23)
CO2: 27 mmol/L (ref 22–32)
Calcium: 9.1 mg/dL (ref 8.9–10.3)
Chloride: 100 mmol/L (ref 98–111)
Creatinine, Ser: 1.62 mg/dL — ABNORMAL HIGH (ref 0.44–1.00)
GFR, Estimated: 32 mL/min — ABNORMAL LOW (ref >60)
Glucose, Bld: 312 mg/dL — ABNORMAL HIGH (ref 70–99)
Potassium: 4.9 mmol/L (ref 3.5–5.1)
Sodium: 134 mmol/L — ABNORMAL LOW (ref 135–145)

## 2022-09-28 LAB — GLUCOSE, CAPILLARY
Glucose-Capillary: 193 mg/dL — ABNORMAL HIGH (ref 70–99)
Glucose-Capillary: 258 mg/dL — ABNORMAL HIGH (ref 70–99)
Glucose-Capillary: 266 mg/dL — ABNORMAL HIGH (ref 70–99)
Glucose-Capillary: 279 mg/dL — ABNORMAL HIGH (ref 70–99)
Glucose-Capillary: 284 mg/dL — ABNORMAL HIGH (ref 70–99)
Glucose-Capillary: 322 mg/dL — ABNORMAL HIGH (ref 70–99)
Glucose-Capillary: 349 mg/dL — ABNORMAL HIGH (ref 70–99)

## 2022-09-28 LAB — MAGNESIUM: Magnesium: 2.3 mg/dL (ref 1.7–2.4)

## 2022-09-28 MED ORDER — INSULIN GLARGINE-YFGN 100 UNIT/ML ~~LOC~~ SOLN
5.0000 [IU] | Freq: Every day | SUBCUTANEOUS | Status: DC
  Administered 2022-09-28 – 2022-10-03 (×6): 5 [IU] via SUBCUTANEOUS
  Filled 2022-09-28 (×6): qty 0.05

## 2022-09-28 MED ORDER — BUMETANIDE 0.25 MG/ML IJ SOLN
2.0000 mg | Freq: Two times a day (BID) | INTRAMUSCULAR | Status: DC
  Administered 2022-09-28 – 2022-09-30 (×5): 2 mg via INTRAVENOUS
  Filled 2022-09-28 (×7): qty 8

## 2022-09-28 NOTE — Progress Notes (Signed)
Heart Failure Stewardship Pharmacist Progress Note   PCP: Darrow Bussing, MD PCP-Cardiologist: Bryan Lemma, MD    HPI:  77 yo F with PMH of HFrEF, T2DM, HTN, HLD, CKD, COPD, and PAD.  Admitted 11/2015 with A/C Systolic HF. Echo at that time with EF 40-45%. Cath during that admission with mild to moderate, but non-obstructive CAD including 15% distal LAD stenosis, 30% ulcerated mid LCx disease, and 40% mid RCA stenosis. Followe by the advanced heart failure team until 11/2016 when EF recovered to 55-60%.   She was admitted 09/2020 with acute on chronic HF in the setting of dietary noncompliance. EF was 50-55%. She was scheduled to follow up with HF TOC and cancelled her appt.   Admitted 02/2022 with AKI, RSV, and acute HF. BNP >4500. EF back down 35-40%. Patient was restarted on Entresto and continued on carvedilol with outpatient consideration of MRA and SGLT2i once recovered form RSV.   Presented to the ED on 8/4 with LE edema, shortness of breath, weight gain cough, N/D, fatigue, and body aches. She states over the last two weeks she has had progression of these symptoms. In the ED trop 1610>9604, BNP >4500. CXR with mild pulmonary vascular congestion. Repeat ECHO with LVEF 30-35%, moderately reduced RV, mild-mod MR, and severe TR. Possible cath before discharge.   Current HF Medications: Diuretic: bumetanide 2 mg IV BID Beta Blocker: carvedilol 3.125 mg BID ACE/ARB/ARNI: Entresto 24/26 mg BID SGLT2i: Jardiance 10 mg daily Other: Ferrlecit 250 mg IV daily x 4 doses  Prior to admission HF Medications: Diuretic: furosemide 40 mg BID Beta blocker: carvedilol 3.125 mg BID ACE/ARB/ARNI: Entresto 49/51 mg BID  Pertinent Lab Values: Serum creatinine 1.59, BUN 38, Potassium 4.2, Sodium 134, BNP >4500, Magnesium 2.1, A1c 10.9 (improved from 13.3 in January)   Vital Signs: Weight: 128 lbs (admission weight: 145 lbs) Blood pressure: 110-130/60s  Heart rate: 50-60s  I/O: net -10.1L  yesterday, net -16.1L since admission  Medication Assistance / Insurance Benefits Check: Does the patient have prescription insurance? No  Does the patient qualify for medication assistance through manufacturers or grants?   Yes Eligible grants and/or patient assistance programs: Valla Leaver Medication assistance applications in progress: Jardiance  Medication assistance applications approved: Entresto Approved medication assistance renewals will be completed by: [facility]  Outpatient Pharmacy:  Prior to admission outpatient pharmacy: [facility] Is the patient willing to use [facility] TOC pharmacy at discharge? Yes Is the patient willing to transition their outpatient pharmacy to utilize a [facility] outpatient pharmacy?   No    Assessment: 1. Acute on chronic systolic CHF (LVEF 35-40%). NYHA class III symptoms. - Continue bumetanide 2 mg IV BID. May be able to transition to torsemide soon. Strict I/Os and daily weights. Keep K>4 and Mg>2. KCl 60 mEq BID ordered for replacement.  - Continue carvedilol 3.125 mg BID  - Continue Entresto 24/26 mg BID, consider increasing back to 49/51 mg prior to discharge - Consider starting MRA prior to discharge once renal function stable - Continue Jardiance 10 mg daily - patient assistance initiated - Continue Ferrlecit 250 mg IV daily x 4 doses   Plan: 1) Medication changes recommended at this time: - Continue IV diuresis  2) Patient assistance: - Already receives [employer] from [employer] patient assistance - Landscape architect for [employer] patient assistance initiated. Patient portion completed and signed.   3)  Education  - Patient has been educated on current HF medications and potential additions to HF medication regimen - Patient verbalizes understanding that  over the next few months, these medication doses may change and more medications may be added to optimize HF regimen - Patient has been educated on basic disease state pathophysiology and goals of  therapy   Sharen Hones, PharmD, BCPS Heart Failure Stewardship Pharmacist Phone [phone]

## 2022-09-28 NOTE — Care Management Important Message (Signed)
Important Message  Patient Details  Name: [name] MRN: [id number] Date of Birth: [birth year]   Medicare Important Message Given:  Yes     Renie Ora 09/28/2022, 8:30 AM

## 2022-09-28 NOTE — Progress Notes (Signed)
CCMD notified that pt had 5 runs of Vtach. Pt is asymptomatic. Dr. Izora Ribas and Dr. Marlin Canary notifed.

## 2022-09-28 NOTE — Telephone Encounter (Signed)
Advanced Heart Failure Patient Advocate Encounter  Application for Jardiance faxed to [facility] on 09/28/2022. Application form attached to patient chart.  Burnell Blanks, CPhT Rx Patient Advocate Phone: [phone]

## 2022-09-28 NOTE — Plan of Care (Signed)

## 2022-09-28 NOTE — Progress Notes (Signed)
PT Cancellation Note  Patient Details Name: DAMINI COVAULT MRN: [id number] DOB: 11/07/1945   Cancelled Treatment:    Reason Eval/Treat Not Completed: Other (comment) (Pt refused stating she just bathed and she "is too tired to get up". Will return as able.)   Bevelyn Buckles 09/28/2022, 1:17 PM  M,PT Acute [employer] [phone]

## 2022-09-28 NOTE — Progress Notes (Signed)
  Progress Note   Patient: [name]:[id number] DOB: 09/04/1945 DOA: 09/24/2022     3 DOS: the patient was seen and examined on 09/28/2022   Brief hospital course: [name] was admitted to the hospital with the working diagnosis of heart failure exacerbation.   76/F with chronic systolic CHF, NICM EF 35%, CKD 3B, type 2 diabetes mellitus, COPD presented to the ED with progressive dyspnea on exertion and worsening lower extremity edema X1 month, despite compliance with diuretics, recently her Lasix dose has been increased to 80 Mg twice daily, she has gained 22 pounds fluid weight over the last few months. In the ED vital signs stable, BNP> 4500, creatinine 1.6, albumin 3.0, hemoglobin 9.3, chest x-ray with mild pulmonary edema   Assessment and Plan: * Acute on chronic systolic CHF (congestive heart failure) (HCC) Echocardiogram with reduced LV systolic function EF 30 to 35%, global hypokinesis, no LVH, RV systolic function with moderate reduction,  RV with moderate dilatation, RVSP 48.8, LA and RA with moderate dilatation, mild to moderate MR, severe TR.   Urine output 3,750 ml Systolic blood pressure 115 to 130 mmHg.  Plan to continue diuresis with IV bumetanide 3 mg bid.  Augment diuresis with acetazolamide.  Empagliflozin and carvedilol.  Entresto. Add ted hose.   CKD stage 3b, GFR 30-44 ml/min (HCC) Volume status is improving, renal function today with serum cr at 1,59 with K at 4,2 and serum bicarbonate at 23.  Na 134 and Mg 2,1   Continue aggressive diuresis. Follow up renal function in am.   Essential hypertension Continue blood pressure control with entresto and carvedilol.  Diuresis with bumetanide.   Type 2 diabetes mellitus with hyperlipidemia (HCC) Uncontrolled T2Dm with hyperglycemia/ hypoglycemia.   Fasting glucose today 316   Plan to continue glucose cover and monitoring with insulin sliding scale.  Resume basal insulin with 5 units.  Patient is tolerating po  well.   Continue with statin.   PAD (peripheral artery disease) (HCC) Continue blood pressure control. Continue statin and aspirin.   Iron deficiency anemia Serum iron 21, TIBC 375, transferrin saturation 6, ferritin 36. SP IV iron infusion.        Subjective: patient is feeling better, dyspnea and edema improving, but not yet back to baseline.   Physical Exam: Vitals:   09/27/22 1752 09/27/22 1900 09/28/22 0206 09/28/22 0842  BP:  (!) 105/58 130/67 (!) 115/52  Pulse: 61 (!) 56 67   Resp:  17 16 18   Temp:  98.7 F (37.1 C) 98.4 F (36.9 C) 97.8 F (36.6 C)  TempSrc:  Oral Oral Oral  SpO2:  96% 98% 98%  Weight:   60.4 kg 58.4 kg  Height:       Neurology awake and alert ENT with mild pallor Cardiovascular with S1 and S2 present and regular with no gallops, rubs, positive systolic murmur at the right lower sternal border.  Positive moderate JVD Lower extremity edema ++ pitting bilaterally Respiratory with rales at bases with no wheezing Abdomen with no distention  Data Reviewed:    Family Communication: no family at the bedside   Disposition: Status is: Inpatient Remains inpatient appropriate because: IV diuresis   Planned Discharge Destination: Home      Author: Coralie Keens, MD 09/28/2022 1:14 PM  For on call review [email or web address].

## 2022-09-28 NOTE — Inpatient Diabetes Management (Signed)
Inpatient Diabetes Program Recommendations  AACE/ADA: New Consensus Statement on Inpatient Glycemic Control (2015)  Target Ranges:  Prepandial:   less than 140 mg/dL      Peak postprandial:   less than 180 mg/dL (1-2 hours)      Critically ill patients:  140 - 180 mg/dL   Lab Results  Component Value Date   GLUCAP 349 (H) 09/28/2022   HGBA1C 11.0 (H) 09/26/2022    Review of Glycemic Control  Diabetes history: DM2 Outpatient Diabetes medications: 70/30 25 units QAM, 70/30 12 units QPM Current orders for Inpatient glycemic control: Novolog 0-9 units TID, Jardiance 10 mg daily  Inpatient Diabetes Program Recommendations:    Please consider:  Semglee 6 units every day (0.15 units/kg).  Will continue to follow while inpatient.  Thank you, Dulce Sellar, MSN, CDCES Diabetes Coordinator Inpatient Diabetes Program [phone] (team pager from 8a-5p)

## 2022-09-29 DIAGNOSIS — I10 Essential (primary) hypertension: Secondary | ICD-10-CM | POA: Diagnosis not present

## 2022-09-29 DIAGNOSIS — E1169 Type 2 diabetes mellitus with other specified complication: Secondary | ICD-10-CM | POA: Diagnosis not present

## 2022-09-29 DIAGNOSIS — N1832 Chronic kidney disease, stage 3b: Secondary | ICD-10-CM | POA: Diagnosis not present

## 2022-09-29 DIAGNOSIS — I5023 Acute on chronic systolic (congestive) heart failure: Secondary | ICD-10-CM | POA: Diagnosis not present

## 2022-09-29 LAB — GLUCOSE, CAPILLARY
Glucose-Capillary: 116 mg/dL — ABNORMAL HIGH (ref 70–99)
Glucose-Capillary: 217 mg/dL — ABNORMAL HIGH (ref 70–99)
Glucose-Capillary: 229 mg/dL — ABNORMAL HIGH (ref 70–99)
Glucose-Capillary: 273 mg/dL — ABNORMAL HIGH (ref 70–99)
Glucose-Capillary: 312 mg/dL — ABNORMAL HIGH (ref 70–99)

## 2022-09-29 MED ORDER — SPIRONOLACTONE 12.5 MG HALF TABLET
12.5000 mg | ORAL_TABLET | Freq: Every day | ORAL | Status: DC
  Administered 2022-09-29 – 2022-10-03 (×5): 12.5 mg via ORAL
  Filled 2022-09-29 (×5): qty 1

## 2022-09-29 MED ORDER — INSULIN ASPART 100 UNIT/ML IJ SOLN
0.0000 [IU] | Freq: Every day | INTRAMUSCULAR | Status: DC
  Administered 2022-09-29: 4 [IU] via SUBCUTANEOUS
  Administered 2022-09-29: 3 [IU] via SUBCUTANEOUS
  Administered 2022-09-30: 2 [IU] via SUBCUTANEOUS
  Administered 2022-10-01: 3 [IU] via SUBCUTANEOUS

## 2022-09-29 NOTE — Progress Notes (Signed)
Heart Failure Stewardship Pharmacist Progress Note   PCP: Darrow Bussing, MD PCP-Cardiologist: Bryan Lemma, MD    HPI:  77 yo F with PMH of HFrEF, T2DM, HTN, HLD, CKD, COPD, and PAD.  Admitted 11/2015 with A/C Systolic HF. Echo at that time with EF 40-45%. Cath during that admission with mild to moderate, but non-obstructive CAD including 15% distal LAD stenosis, 30% ulcerated mid LCx disease, and 40% mid RCA stenosis. Followe by the advanced heart failure team until 11/2016 when EF recovered to 55-60%.   She was admitted 09/2020 with acute on chronic HF in the setting of dietary noncompliance. EF was 50-55%. She was scheduled to follow up with HF TOC and cancelled her appt.   Admitted 02/2022 with AKI, RSV, and acute HF. BNP >4500. EF back down 35-40%. Patient was restarted on Entresto and continued on carvedilol with outpatient consideration of MRA and SGLT2i once recovered form RSV.   Presented to the ED on 8/4 with LE edema, shortness of breath, weight gain cough, N/D, fatigue, and body aches. She states over the last two weeks she has had progression of these symptoms. In the ED trop 3295>1884, BNP >4500. CXR with mild pulmonary vascular congestion. Repeat ECHO with LVEF 30-35%, moderately reduced RV, mild-mod MR, and severe TR.    Current HF Medications: Diuretic: bumetanide 2 mg IV BID Beta Blocker: carvedilol 3.125 mg BID ACE/ARB/ARNI: Entresto 24/26 mg BID SGLT2i: Jardiance 10 mg daily Other: s/p Ferrlecit 250 mg IV daily x 4 doses  Prior to admission HF Medications: Diuretic: furosemide 40 mg BID Beta blocker: carvedilol 3.125 mg BID ACE/ARB/ARNI: Entresto 49/51 mg BID  Pertinent Lab Values: Serum creatinine 1.55, BUN 36, Potassium 4.1, Sodium 138, BNP >4500, Magnesium 2.3, A1c 10.9 (improved from 13.3 in January)   Vital Signs: Weight: 124 lbs (admission weight: 145 lbs) Blood pressure: 120-130/80s  Heart rate: 50-60s  I/O: net -3.2L yesterday, net -19.3L since  admission  Medication Assistance / Insurance Benefits Check: Does the patient have prescription insurance? No  Does the patient qualify for medication assistance through manufacturers or grants?   Yes Eligible grants and/or patient assistance programs: Valla Leaver Medication assistance applications in progress: Jardiance  Medication assistance applications approved: Entresto Approved medication assistance renewals will be completed by: [facility]  Outpatient Pharmacy:  Prior to admission [facility] pharmacy: [facility] Is the patient willing to use [facility] TOC pharmacy at discharge? Yes Is the patient willing to transition their outpatient pharmacy to utilize a [facility] outpatient pharmacy?   No    Assessment: 1. Acute on chronic systolic CHF (LVEF 35-40%). NYHA class III symptoms. - Continue bumetanide 2 mg IV BID. May be able to transition to torsemide soon. Strict I/Os and daily weights. Keep K>4 and Mg>2. - Continue carvedilol 3.125 mg BID  - Consider increasing back to Entresto 49/51 mg BID when off IV bumex - Consider starting MRA prior to discharge once renal function stable - Continue Jardiance 10 mg daily - patient assistance initiated - S/p Ferrlecit 250 mg IV daily x 4 doses   Plan: 1) Medication changes recommended at this time: - Increase Entresto to 49/51 mg BID when off IV bumex  2) Patient assistance: - Already receives Entresto from [employer] patient assistance - Landscape architect for [employer] patient assistance initiated. Patient portion completed and signed.   3)  Education  - Patient has been educated on current HF medications and potential additions to HF medication regimen - Patient verbalizes understanding that over the next few months,  these medication doses may change and more medications may be added to optimize HF regimen - Patient has been educated on basic disease state pathophysiology and goals of therapy   Sharen Hones, PharmD, BCPS Heart Failure  Stewardship Pharmacist Phone [phone]

## 2022-09-29 NOTE — Plan of Care (Signed)
  Problem: Education: Goal: Knowledge of General Education information will improve Description: Including pain rating scale, medication(s)/side effects and non-pharmacologic comfort measures Outcome: Progressing   Problem: Health Behavior/Discharge Planning: Goal: Ability to manage health-related needs will improve Outcome: Progressing   Problem: Clinical Measurements: Goal: Respiratory complications will improve Outcome: Progressing   Problem: Clinical Measurements: Goal: Cardiovascular complication will be avoided Outcome: Progressing   Problem: Activity: Goal: Risk for activity intolerance will decrease Outcome: Progressing   Problem: Pain Managment: Goal: General experience of comfort will improve Outcome: Progressing   Problem: Safety: Goal: Ability to remain free from injury will improve Outcome: Progressing   

## 2022-09-29 NOTE — Progress Notes (Addendum)
  Progress Note   Patient: [name]:[id number] DOB: October 06, 1945 DOA: 09/24/2022     4 DOS: the patient was seen and examined on 09/29/2022   Brief hospital course: [name] was admitted to the hospital with the working diagnosis of heart failure exacerbation.   76/F with chronic systolic CHF, NICM EF 35%, CKD 3B, type 2 diabetes mellitus, COPD presented to the ED with progressive dyspnea on exertion and worsening lower extremity edema X1 month, despite compliance with diuretics, recently her Lasix dose has been increased to 80 Mg twice daily, she has gained 22 pounds fluid weight over the last few months. In the ED vital signs stable, BNP> 4500, creatinine 1.6, albumin 3.0, hemoglobin 9.3, chest x-ray with mild pulmonary edema   Assessment and Plan: * Acute on chronic systolic CHF (congestive heart failure) (HCC) Echocardiogram with reduced LV systolic function EF 30 to 35%, global hypokinesis, no LVH, RV systolic function with moderate reduction,  RV with moderate dilatation, RVSP 48.8, LA and RA with moderate dilatation, mild to moderate MR, severe TR.   Urine output 2,900  ml Systolic blood pressure 123 to 139 mmHg. Telemetry with sinus rhythm with no significant ectopy (personally reviewed).   Plan to continue diuresis with IV bumetanide 2 mg bid.  Augment diuresis with acetazolamide.  Empagliflozin and carvedilol.  Entresto. Add ted hose Add spironolactone  CKD stage 3b, GFR 30-44 ml/min (HCC) Renal function today with serum cr at 1.5 with K at 4,1 and serum bicarbonate at 26. Na 138 and Mg 2.3   Continue aggressive diuresis. Follow up renal function in am.   Essential hypertension Continue blood pressure control with entresto and carvedilol.  Diuresis with bumetanide.   Type 2 diabetes mellitus with hyperlipidemia (HCC) Uncontrolled T2Dm with hyperglycemia/ hypoglycemia.   Fasting glucose today 123  Plan to continue glucose cover and monitoring with insulin sliding  scale.  Basal insulin with 5 units.  Patient is tolerating po well.   Continue with statin.   PAD (peripheral artery disease) (HCC) Continue blood pressure control. Continue statin and aspirin.   Iron deficiency anemia Serum iron 21, TIBC 375, transferrin saturation 6, ferritin 36. SP IV iron infusion.   Subjective: Patient is feeling better, edema and dyspnea continue to improve., she is very weak and deconditioned   Physical Exam: Vitals:   09/28/22 2243 09/29/22 0531 09/29/22 0903 09/29/22 1210  BP:  122/63 128/83 139/68  Pulse: 69  61 97  Resp: 18 18  18   Temp: 98 F (36.7 C) 97.9 F (36.6 C)  97.8 F (36.6 C)  TempSrc: Oral Oral  Oral  SpO2: 100%   94%  Weight:   56.2 kg   Height:       Neurology awake and alert ENT with mild pallor Cardiovascular with S1 and S2 present and regular with no gallops. Positive systolic murmur at the apex Mild JVD Lower extremity edema ++ pitting Respiratory with no rales or wheezing, no rhonchi Abdomen with no distention  Data Reviewed:    Family Communication: I spoke with patient's daughter over the phone we talked in detail about patient's condition, plan of care and prognosis and all questions were addressed.  Disposition: Status is: Inpatient Remains inpatient appropriate because: IV diuresis   Planned Discharge Destination: Home     Author: Coralie Keens, MD 09/29/2022 3:56 PM  For on call review [email or web address].

## 2022-09-29 NOTE — Progress Notes (Addendum)
Rounding Note    Patient Name: [name] Date of Encounter: 09/29/2022  [facility] Cardiologist: Bryan Lemma, MD   Subjective   No complaints, legs warm but still volume up  Inpatient Medications    Scheduled Meds:  aspirin EC  81 mg Oral Daily   atorvastatin  40 mg Oral Daily   bumetanide (BUMEX) IV  2 mg Intravenous BID   carvedilol  3.125 mg Oral BID WC   empagliflozin  10 mg Oral Daily   heparin  5,000 Units Subcutaneous Q8H   insulin aspart  0-5 Units Subcutaneous QHS   insulin aspart  0-9 Units Subcutaneous TID WC   insulin glargine-yfgn  5 Units Subcutaneous Daily   sacubitril-valsartan  1 tablet Oral BID   Continuous Infusions:  PRN Meds: acetaminophen **OR** acetaminophen, guaiFENesin-dextromethorphan, melatonin, ondansetron **OR** ondansetron (ZOFRAN) IV   Vital Signs    Vitals:   09/28/22 2100 09/28/22 2243 09/29/22 0531 09/29/22 0903  BP: 130/63  122/63 128/83  Pulse: 69 69  61  Resp: 17 18 18    Temp: 97.9 F (36.6 C) 98 F (36.7 C) 97.9 F (36.6 C)   TempSrc: Oral Oral Oral   SpO2: 100% 100%    Weight:    56.2 kg  Height:        Intake/Output Summary (Last 24 hours) at 09/29/2022 1004 Last data filed at 09/29/2022 0903 Gross per 24 hour  Intake 480 ml  Output 2990 ml  Net -2510 ml      09/29/2022    9:03 AM 09/28/2022    8:42 AM 09/28/2022    2:06 AM  Last 3 Weights  Weight (lbs) 124 lb 128 lb 12 oz 133 lb 2.5 oz  Weight (kg) 56.246 kg 58.4 kg 60.4 kg      Telemetry    Sinus to sinus bradycardia 50s - Personally Reviewed  ECG    No new tracings - Personally Reviewed  Physical Exam   GEN: No acute distress.   Neck: + JVD Cardiac: RRR, no murmurs, rubs, or gallops.  Respiratory: brackles in basees GI: Soft, nontender, non-distended  MS: 2+ BLE pitting edema; No deformity. Neuro:  Nonfocal  Psych: Normal affect   Labs    High Sensitivity Troponin:   Recent Labs  Lab 09/25/22 0029 09/25/22 0132  TROPONINIHS  1,538* 1,438*     Chemistry Recent Labs  Lab 09/25/22 0029 09/25/22 1029 09/28/22 0136 09/28/22 1914 09/29/22 0641  NA 135   < > 134* 134* 138  K 3.8   < > 4.2 4.9 4.1  CL 100   < > 99 100 102  CO2 25   < > 23 27 26   GLUCOSE 179*   < > 316* 312* 123*  BUN 32*   < > 38* 38* 36*  CREATININE 1.65*   < > 1.59* 1.62* 1.55*  CALCIUM 8.6*   < > 8.5* 9.1 8.8*  MG  --    < > 2.1 2.3 2.3  PROT 5.8*  --   --   --   --   ALBUMIN 3.0*  --   --   --   --   AST 32  --   --   --   --   ALT 28  --   --   --   --   ALKPHOS 90  --   --   --   --   BILITOT 1.0  --   --   --   --  GFRNONAA 32*   < > 33* 32* 34*  ANIONGAP 10   < > 12 7 10    < > = values in this interval not displayed.    Lipids  Recent Labs  Lab 09/25/22 0703  CHOL 90  TRIG 32  HDL 34*  LDLCALC 50  CHOLHDL 2.6    Hematology Recent Labs  Lab 09/25/22 0029 09/26/22 0644 09/27/22 0222  WBC 3.1* 3.2* 3.2*  RBC 3.46* 3.83* 3.20*  HGB 9.3* 10.5* 8.6*  HCT 29.7* 33.7* 27.3*  MCV 85.8 88.0 85.3  MCH 26.9 27.4 26.9  MCHC 31.3 31.2 31.5  RDW 18.0* 18.0* 17.9*  PLT 227 237 207   Thyroid  Recent Labs  Lab 09/25/22 0703  TSH 2.166    BNP Recent Labs  Lab 09/25/22 0029 09/26/22 0644  BNP >4,500.0* 4,141.7*    DDimer No results for input(s): "DDIMER" in the last 168 hours.   Radiology    No results found.  Cardiac Studies   Echocardiogram 02/27/2022  1. Left ventricular ejection fraction, by estimation, is 35 to 40%. The  left ventricle has moderately decreased function. The left ventricle  demonstrates global hypokinesis. The left ventricular internal cavity size  was moderately dilated. Left  ventricular diastolic parameters are consistent with Grade III diastolic  dysfunction (restrictive).   2. Right ventricular systolic function is low normal. The right  ventricular size is moderately enlarged. There is mildly elevated  pulmonary artery systolic pressure. The estimated right ventricular  systolic  pressure is 39.1 mmHg.   3. Left atrial size was moderately dilated.   4. Right atrial size was moderately dilated.   5. The mitral valve is degenerative. Mild mitral valve regurgitation. No  evidence of mitral stenosis.   6. The aortic valve is tricuspid. Aortic valve regurgitation is not  visualized. No aortic stenosis is present.   7. The inferior vena cava is normal in size with <50% respiratory  variability, suggesting right atrial pressure of 8 mmHg.   8. Evidence of atrial level shunting detected by color flow Doppler.  There is a small patent foramen ovale with predominantly left to right  shunting across the atrial septum.   Comparison(s): The left ventricular function is worsened. Prior EF 50-55%.   Patient Profile     77 y.o. female with history of DM type II, hypertension, hyperlipidemia, CKD, COPD, chronic HFrEF with an EF of 35 to 40% in January 2024, bilateral PAD.  Patient admitted for worsening symptoms of peripheral edema, cough, shortness of breath, significant weight gain 25+ pounds.   Patient with history of reduced EF as low as 20% in 2018 thought likely nonischemic due to nonobstructive CAD noted on catheterization in 2018. She has had labile EF over the years increased to 50 to 55% 09/2020, however most recent echo in January 2024 showed EF 35 to 40% with mild RV dysfunction.  Grade 3 diastolic dysfunction.  RVSP 39.  Assessment & Plan    Acute on chronic systolic and diastolic heart failure - Echo 0865 with LVEF 40-45% - heart cath with mild to moderate nonobstructive disease - EF recovered to 55-60% with GDMT in 2018 - EF 35-40% during CHF admission 02/2022, restated on  - echo this admission with LVEF 35-40% and grade III DD -  bumex 2 mg IV BID, coreg 3.125 mg BID, 10 mg jardiance, entresto - 3 L urine output - she remains significantly volume up on exam, but extremities are warm - would consider continuing bumex  today - renal function stable   Mild to  moderate MR Severe TR - diuresis as above, will continue to monitor   Nonobstructive CAD - heart cath 2017 with nonobstructive disease - hs troponin    CKD  - sCr 1.55 (1.62), K 4.1  - was 1.1-1.4 02/2022   DM with hyperglycemia - A1c 11.0% - per primary       For questions or updates, please contact [facility] Please consult [email or web address] for contact info under        Signed, [name], PA  09/29/2022, 10:04 AM    Personally seen and examined. Agree with APP above with the following comments:  Patient notes that she is feeling much better.  No CP, no SOB, no palpitations no syncope.  Exam notable for Improved JVD and LE edema.  Legs are now warm. Off O2.  Stable creatinine, stable K Tele: Two runs of NSVT, Intermittent IVCD  Would recommend  - Continue IV bumex and GDMT, potential PO transition to home PO lasix tomorrow.  BNP for tomorrow Rest as above  Riley Lam, MD FASE [facility] Cardiologist [facility]  [address], [address] [place], [place] [address] [phone]  11:54 AM

## 2022-09-29 NOTE — Plan of Care (Signed)

## 2022-09-29 NOTE — Progress Notes (Signed)
Physical Therapy Treatment Patient Details Name: [name] MRN: [id number] DOB: 22-Dec-1945 Today's Date: 09/29/2022   History of Present Illness 77 y.o. female presents to [facility] hospital on 09/24/2022 with worsening LE edema. Admitted for acute on chronic CHF. PMH includes breast CA, CHF, CAD, DM, NICM.    PT Comments  Pt admitted with above diagnosis. Pt was able to ambulate with RW with min guard assist without LOB.  Pt progressing well.  Tolerated standing at sink to bathe 8 minutes. Will continue to follow acutely.  Pt currently with functional limitations due to the deficits listed below (see PT Problem List). Pt will benefit from acute skilled PT to increase their independence and safety with mobility to allow discharge.       If plan is discharge home, recommend the following: Assist for transportation;Help with stairs or ramp for entrance;Assistance with cooking/housework   Can travel by private vehicle        Equipment Recommendations  None recommended by PT    Recommendations for Other Services       Precautions / Restrictions Precautions Precautions: Fall Restrictions Weight Bearing Restrictions: No     Mobility  Bed Mobility Overal bed mobility: Needs Assistance Bed Mobility: Supine to Sit     Supine to sit: Supervision, HOB elevated          Transfers Overall transfer level: Needs assistance Equipment used: None Transfers: Sit to/from Stand             General transfer comment: verbal cues for hand placement    Ambulation/Gait Ambulation/Gait assistance: Contact guard assist Gait Distance (Feet): 350 Feet Assistive device: Rolling walker (2 wheels) Gait Pattern/deviations: Step-through pattern, Decreased stride length Gait velocity: functional Gait velocity interpretation: 1.31 - 2.62 ft/sec, indicative of limited community ambulator   General Gait Details: slowed step-through gait with occasional cues to stay close to rW. Overall safe with cues.    Upon return to room, pt stood at sink to bathe as on arrival her purewick had leaked and PT asked pt if she would like to wash up once back to room. Pt was able to stand at sink and bathe self with min guard assist.   Stairs             Wheelchair Mobility     Tilt Bed    Modified Rankin (Stroke Patients Only)       Balance Overall balance assessment: Needs assistance Sitting-balance support: No upper extremity supported, Feet supported Sitting balance-Leahy Scale: Good     Standing balance support: Single extremity supported, Reliant on assistive device for balance Standing balance-Leahy Scale: Poor Standing balance comment: Relies on at least 1 UE suport for balance                            Cognition   Behavior During Therapy: WFL for tasks assessed/performed Overall Cognitive Status: Within Functional Limits for tasks assessed                                 General Comments: delayed responses, likely HOH vs cognition, pt following commands appropriately        Exercises      General Comments General comments (skin integrity, edema, etc.): VSS      Pertinent Vitals/Pain Pain Assessment Pain Assessment: No/denies pain    Home Living  Prior Function            PT Goals (current goals can now be found in the care plan section) Acute Rehab PT Goals Patient Stated Goal: to go home Progress towards PT goals: Progressing toward goals    Frequency    Min 1X/week      PT Plan      Co-evaluation              AM-PAC PT "6 Clicks" Mobility   Outcome Measure  Help needed turning from your back to your side while in a flat bed without using bedrails?: A Little Help needed moving from lying on your back to sitting on the side of a flat bed without using bedrails?: A Little Help needed moving to and from a bed to a chair (including a wheelchair)?: A Little Help needed standing up  from a chair using your arms (e.g., wheelchair or bedside chair)?: A Little Help needed to walk in hospital room?: A Little Help needed climbing 3-5 steps with a railing? : A Lot 6 Click Score: 17    End of Session Equipment Utilized During Treatment: Gait belt Activity Tolerance: Patient tolerated treatment well Patient left: with call bell/phone within reach;in bed;with bed alarm set Nurse Communication: Mobility status PT Visit Diagnosis: Other abnormalities of gait and mobility (R26.89);Muscle weakness (generalized) (M62.81)     Time: 5462-7035 PT Time Calculation (min) (ACUTE ONLY): 32 min  Charges:    $Gait Training: 8-22 mins $Self Care/Home Management: 8-22 PT General Charges $$ ACUTE PT VISIT: 1 Visit                      M,PT Acute Rehab Services [phone]    Bevelyn Buckles 09/29/2022, 4:55 PM

## 2022-09-30 DIAGNOSIS — E1169 Type 2 diabetes mellitus with other specified complication: Secondary | ICD-10-CM | POA: Diagnosis not present

## 2022-09-30 DIAGNOSIS — N1832 Chronic kidney disease, stage 3b: Secondary | ICD-10-CM | POA: Diagnosis not present

## 2022-09-30 DIAGNOSIS — I10 Essential (primary) hypertension: Secondary | ICD-10-CM | POA: Diagnosis not present

## 2022-09-30 DIAGNOSIS — I5023 Acute on chronic systolic (congestive) heart failure: Secondary | ICD-10-CM | POA: Diagnosis not present

## 2022-09-30 LAB — GLUCOSE, CAPILLARY
Glucose-Capillary: 275 mg/dL — ABNORMAL HIGH (ref 70–99)
Glucose-Capillary: 333 mg/dL — ABNORMAL HIGH (ref 70–99)
Glucose-Capillary: 234 mg/dL — ABNORMAL HIGH (ref 70–99)
Glucose-Capillary: 207 mg/dL — ABNORMAL HIGH (ref 70–99)
Glucose-Capillary: 218 mg/dL — ABNORMAL HIGH (ref 70–99)

## 2022-09-30 LAB — MAGNESIUM: Magnesium: 2.3 mg/dL (ref 1.7–2.4)

## 2022-09-30 MED ORDER — ACETAZOLAMIDE ER 500 MG PO CP12: 500.0000 mg | ORAL_CAPSULE | Freq: Two times a day (BID) | ORAL | Status: DC

## 2022-09-30 MED ORDER — POTASSIUM CHLORIDE CRYS ER 20 MEQ PO TBCR
40.0000 meq | EXTENDED_RELEASE_TABLET | Freq: Once | ORAL | Status: AC
  Administered 2022-09-30: 40 meq via ORAL
  Filled 2022-09-30: qty 2

## 2022-09-30 MED ORDER — BISACODYL 5 MG PO TBEC
5.0000 mg | DELAYED_RELEASE_TABLET | Freq: Once | ORAL | Status: AC
  Administered 2022-09-30: 5 mg via ORAL
  Filled 2022-09-30: qty 1

## 2022-09-30 MED ORDER — ACETAZOLAMIDE 250 MG PO TABS
500.0000 mg | ORAL_TABLET | Freq: Two times a day (BID) | ORAL | Status: DC
  Administered 2022-09-30 – 2022-10-02 (×6): 500 mg via ORAL
  Filled 2022-09-30 (×7): qty 2

## 2022-09-30 MED ORDER — BISACODYL 5 MG PO TBEC: 5.0000 mg | DELAYED_RELEASE_TABLET | Freq: Every day | ORAL | Status: DC | PRN

## 2022-09-30 NOTE — Progress Notes (Signed)
  Progress Note   Patient: [name] YNW:[id number] DOB: 1946-02-11 DOA: 09/24/2022     5 DOS: the patient was seen and examined on 09/30/2022   Brief hospital course: [name] was admitted to the hospital with the working diagnosis of heart failure exacerbation.   76/F with chronic systolic CHF, NICM EF 35%, CKD 3B, type 2 diabetes mellitus, COPD presented to the ED with progressive dyspnea on exertion and worsening lower extremity edema X1 month, despite compliance with diuretics, recently her Lasix dose has been increased to 80 Mg twice daily, she has gained 22 pounds fluid weight over the last few months. In the ED vital signs stable, BNP> 4500, creatinine 1.6, albumin 3.0, hemoglobin 9.3, chest x-ray with mild pulmonary edema   Assessment and Plan: * Acute on chronic systolic CHF (congestive heart failure) (HCC) Echocardiogram with reduced LV systolic function EF 30 to 35%, global hypokinesis, no LVH, RV systolic function with moderate reduction,  RV with moderate dilatation, RVSP 48.8, LA and RA with moderate dilatation, mild to moderate MR, severe TR.   Urine output 4,000  ml Systolic blood pressure 115  to 119 mmHg. Telemetry with occasional PVC and PAC.   Plan to continue diuresis with IV bumetanide 2 mg bid.  Augment diuresis with acetazolamide.  Empagliflozin,spironolactone and carvedilol.  Entresto. Add ted hose  CKD stage 3b, GFR 30-44 ml/min (HCC) Volume status improving.  Renal function with serum cr at 1,71 with K at 3,5 and serum bicarbonate at 27 Na 135 Mg 2.3   Diuresis per cardiology recommendations Follow up renal function in am Add 40 kcl today.   Essential hypertension Continue blood pressure control with entresto and carvedilol.  Diuresis with bumetanide.   Type 2 diabetes mellitus with hyperlipidemia (HCC) Uncontrolled T2Dm with hyperglycemia/ hypoglycemia.   Fasting glucose today 254  Plan to continue glucose cover and monitoring with insulin sliding  scale.  Basal insulin with 5 units.  Patient is tolerating po well.   Continue with statin.   PAD (peripheral artery disease) (HCC) Continue blood pressure control. Continue statin and aspirin.   Iron deficiency anemia Serum iron 21, TIBC 375, transferrin saturation 6, ferritin 36. SP IV iron infusion.        Subjective: Patient very weak and deconditioned today, no chest pain, dyspnea and edema continue to improve.   Physical Exam: Vitals:   09/29/22 1708 09/29/22 1944 09/30/22 0441 09/30/22 0819  BP: 131/61 (!) 122/58 132/75 (!) 119/57  Pulse: 60 60 61 61  Resp:  18 20   Temp:  98 F (36.7 C) 97.9 F (36.6 C) 98.2 F (36.8 C)  TempSrc:  Oral Oral Oral  SpO2:  97% 96% 97%  Weight:   48.9 kg   Height:       Neurology awake and alert ENT with mild pallor Cardiovascular with S1 and S2 present and regular with no gallops, rubs or murmurs No JVD Trace lower extremity edema ted hose in place Respiratory with mild rales at bases with no wheezing Abdomen with no distention  Data Reviewed:    Family Communication: no family at the bedside   Disposition: Status is: Inpatient Remains inpatient appropriate because: IV diuresis   Planned Discharge Destination: Home      Author: Coralie Keens, MD 09/30/2022 11:12 AM  For on call review [email or web address].

## 2022-09-30 NOTE — Plan of Care (Signed)
  Problem: Health Behavior/Discharge Planning: Goal: Ability to identify and utilize available resources and services will improve Outcome: Progressing Goal: Ability to manage health-related needs will improve Outcome: Progressing   Problem: Clinical Measurements: Goal: Respiratory complications will improve Outcome: Progressing   Problem: Clinical Measurements: Goal: Cardiovascular complication will be avoided Outcome: Progressing   Problem: Activity: Goal: Risk for activity intolerance will decrease Outcome: Progressing   Problem: Elimination: Goal: Will not experience complications related to bowel motility Outcome: Progressing   Problem: Pain Managment: Goal: General experience of comfort will improve Outcome: Progressing   Problem: Safety: Goal: Ability to remain free from injury will improve Outcome: Progressing   Problem: Skin Integrity: Goal: Risk for impaired skin integrity will decrease Outcome: Progressing

## 2022-09-30 NOTE — Progress Notes (Signed)
Progress Note  Patient Name: [name] Date of Encounter: 09/30/2022 Primary Cardiologist: Bryan Lemma, MD   Subjective   Overnight still having shortness of breath. Patient notes worsening breathing  Vital Signs    Vitals:   09/29/22 1210 09/29/22 1708 09/29/22 1944 09/30/22 0441  BP: 139/68 131/61 (!) 122/58 132/75  Pulse: 97 60 60 61  Resp: 18  18 20   Temp: 97.8 F (36.6 C)  98 F (36.7 C) 97.9 F (36.6 C)  TempSrc: Oral  Oral Oral  SpO2: 94%  97% 96%  Weight:    48.9 kg  Height:        Intake/Output Summary (Last 24 hours) at 09/30/2022 0726 Last data filed at 09/30/2022 0600 Gross per 24 hour  Intake 980 ml  Output 4000 ml  Net -3020 ml   Filed Weights   09/28/22 0842 09/29/22 0903 09/30/22 0441  Weight: 58.4 kg 56.2 kg 48.9 kg    Physical Exam   GEN: No acute distress.   Cardiac: RRR, no rubs, or gallops. Systolic murmur Respiratory: Bilateral crackles, demonstrably worse from yesterday GI: Soft, nontender, non-distended  MS: No edema  Labs   Telemetry:SR, intermittent IVCD, frequent PVCs without VT   Chemistry Recent Labs  Lab 09/25/22 0029 09/25/22 1029 09/28/22 1914 09/29/22 0641 09/30/22 0110  NA 135   < > 134* 138 135  K 3.8   < > 4.9 4.1 3.5  CL 100   < > 100 102 99  CO2 25   < > 27 26 27   GLUCOSE 179*   < > 312* 123* 254*  BUN 32*   < > 38* 36* 41*  CREATININE 1.65*   < > 1.62* 1.55* 1.71*  CALCIUM 8.6*   < > 9.1 8.8* 9.0  PROT 5.8*  --   --   --   --   ALBUMIN 3.0*  --   --   --   --   AST 32  --   --   --   --   ALT 28  --   --   --   --   ALKPHOS 90  --   --   --   --   BILITOT 1.0  --   --   --   --   GFRNONAA 32*   < > 32* 34* 30*  ANIONGAP 10   < > 7 10 9    < > = values in this interval not displayed.     Hematology Recent Labs  Lab 09/25/22 0029 09/26/22 0644 09/27/22 0222  WBC 3.1* 3.2* 3.2*  RBC 3.46* 3.83* 3.20*  HGB 9.3* 10.5* 8.6*  HCT 29.7* 33.7* 27.3*  MCV 85.8 88.0 85.3  MCH 26.9 27.4 26.9   MCHC 31.3 31.2 31.5  RDW 18.0* 18.0* 17.9*  PLT 227 237 207    Cardiac EnzymesNo results for input(s): "TROPONINI" in the last 168 hours. No results for input(s): "TROPIPOC" in the last 168 hours.   BNP Recent Labs  Lab 09/25/22 0029 09/26/22 0644 09/30/22 0110  BNP >4,500.0* 4,141.7* 2,961.3*     DDimer No results for input(s): "DDIMER" in the last 168 hours.   Cardiac Studies   Cardiac Studies & Procedures   CARDIAC CATHETERIZATION  CARDIAC CATHETERIZATION 05/08/2016  Narrative Findings:  RA = 3 RV = 34/5 PA = 38/12 (21) PCW = 12 Fick cardiac output/index = 4.6/2.6 PVR = 1.9 WU Ao sat = 96% PA sat = 58%, 62%  Assessment:  1. Well compensated filling pressures and cardiac output  Plan/Discussion:  Can go home today on:  Entresto 24/26 bid Digoxin 0.125 daily Spiro 12.5 mg daily Lasix 40mg  daily Ecasa 81mg  daily Atorva 40 mg daily.  Hold carvedilol for now  F/u [facility]  05/16/16 at 9am  [phone]  Bensimhon, Daniel,MD 9:43 AM   CARDIAC CATHETERIZATION  CARDIAC CATHETERIZATION 12/20/2015  Narrative Conclusions: 1. Mild to moderate, nonobstructive coronary artery disease, including 15% distal LAD stenosis, 30% ulcerated mid LCx disease, and 40% mid RCA stenosis. 2. Upper normal left ventricular filling pressure (LVEDP 15 mmHg).  Recommendations: 1. Aggressive risk factor modification including high-intensity statin therapy. 2. Continue evidence based heart failure therapy for nonischemic cardiomyopathy. 3. Hydrate gently today following procedure; restart standing furosemide tomorrow.  Yvonne Kendall, MD [facility] Pager: [phone]  Findings Coronary Findings Diagnostic  Dominance: Right  Left Main Vessel is large. Vessel is angiographically normal.  Left Anterior Descending Vessel is large.  First Diagonal Branch Vessel is small in size.  Second Diagonal Branch Vessel is small in size.  Third Diagonal  Branch Vessel is small in size.  Left Circumflex Vessel is large. The lesion is ulcerative.  First Obtuse Marginal Branch Vessel is small in size.  Second Obtuse Marginal Branch Vessel is large in size. The vessel exhibits minimal luminal irregularities.  Lateral Second Obtuse Marginal Branch The vessel exhibits minimal luminal irregularities.  Third Obtuse Marginal Branch Vessel is small in size.  Right Coronary Artery Vessel is moderate in size.  Right Posterior Descending Artery Vessel is small in size.  Right Posterior Atrioventricular Artery Vessel is small in size.  Intervention  No interventions have been documented.   STRESS TESTS  NM MYOCAR MULTI W/SPECT W 12/17/2015  Narrative CLINICAL DATA:  Chest pain with shortness of breath. History of diabetes, hypertension, smoking, congestive heart failure and Coronary artery disease.  EXAM: MYOCARDIAL IMAGING WITH SPECT (REST AND PHARMACOLOGIC-STRESS)  GATED LEFT VENTRICULAR WALL MOTION STUDY  LEFT VENTRICULAR EJECTION FRACTION  TECHNIQUE: Standard myocardial SPECT imaging was performed after resting intravenous injection of 10 mCi Tc-2m tetrofosmin. Subsequently, intravenous infusion of Lexiscan was performed under the supervision of the Cardiology staff. At peak effect of the drug, 30 mCi Tc-19m tetrofosmin was injected intravenously and standard myocardial SPECT imaging was performed. Quantitative gated imaging was also performed to evaluate left ventricular wall motion, and estimate left ventricular ejection fraction.  COMPARISON:  Portable chest 12/16/2015.  Cardiac MRI 07/07/2008.  FINDINGS: Perfusion: There is a large fixed perfusion defect involving the apex and the distal segments of the anterior and lateral walls. No reversible components are identified.  Wall Motion: Marked apical hypokinesis. There is lesser hypokinesis involving the remainder of the myocardium.  Left Ventricular  Ejection Fraction: 38 %  End diastolic volume 100 ml  End systolic volume 62 ml  IMPRESSION: 1. Large infarct involving the apex, distal anterior and lateral walls. No reversal component to suggest myocardial ischemia.  2. Associated apical hypokinesis.  3. Left ventricular ejection fraction 38%  4. Non invasive risk stratification*: Intermediate  *2012 Appropriate Use Criteria for Coronary Revascularization Focused Update: J Am Coll Cardiol. 2012;59(9):857-881. http://content.dementiazones.com.aspx?articleid=1201161   Electronically Signed By: Carey Bullocks M.D. On: 12/17/2015 13:41   ECHOCARDIOGRAM  ECHOCARDIOGRAM COMPLETE 09/25/2022  Narrative ECHOCARDIOGRAM REPORT    Patient Name:   [name] Date of Exam: 09/25/2022 Medical Rec #:  [id number]    Height:       63.0 in Accession #:  [id number]   Weight:       145.5 lb Date of Birth:  September 09, 1945    BSA:          1.689 m Patient Age:    29 years     BP:           128/75 mmHg Patient Gender: F            HR:           71 bpm. Exam Location:  Inpatient  Procedure: 2D Echo, Color Doppler and Cardiac Doppler  Indications:    acute systolic chf  History:        Patient has prior history of Echocardiogram examinations, most recent 02/27/2022.  Sonographer:    Delcie Roch RDCS Referring Phys: [phone] ERIC CHEN  IMPRESSIONS   1. Left ventricular ejection fraction, by estimation, is 30 to 35%. The left ventricle has moderately decreased function. The left ventricle demonstrates global hypokinesis. Left ventricular diastolic parameters are indeterminate. 2. Right ventricular systolic function is moderately reduced. The right ventricular size is moderately enlarged. There is moderately elevated pulmonary artery systolic pressure. 3. Left atrial size was moderately dilated. 4. Right atrial size was moderately dilated. 5. The mitral valve is degenerative. Mild to moderate mitral valve regurgitation. 6. The  tricuspid valve is degenerative. Tricuspid valve regurgitation is severe. 7. The aortic valve is normal in structure. Aortic valve regurgitation is not visualized. 8. The inferior vena cava is dilated in size with <50% respiratory variability, suggesting right atrial pressure of 15 mmHg. 9. Cannot exclude a small PFO.  Conclusion(s)/Recommendation(s): LVEF mildly worsened compared to prior echo.  FINDINGS Left Ventricle: Left ventricular ejection fraction, by estimation, is 30 to 35%. The left ventricle has moderately decreased function. The left ventricle demonstrates global hypokinesis. The left ventricular internal cavity size was normal in size. There is no left ventricular hypertrophy. Left ventricular diastolic parameters are indeterminate.  Right Ventricle: The right ventricular size is moderately enlarged. Right ventricular systolic function is moderately reduced. There is moderately elevated pulmonary artery systolic pressure. The tricuspid regurgitant velocity is 3.31 m/s, and with an assumed right atrial pressure of 5 mmHg, the estimated right ventricular systolic pressure is 48.8 mmHg.  Left Atrium: Left atrial size was moderately dilated.  Right Atrium: Right atrial size was moderately dilated.  Pericardium: There is no evidence of pericardial effusion.  Mitral Valve: The mitral valve is degenerative in appearance. Mild to moderate mitral valve regurgitation.  Tricuspid Valve: The tricuspid valve is degenerative in appearance. Tricuspid valve regurgitation is severe.  Aortic Valve: The aortic valve is normal in structure. Aortic valve regurgitation is not visualized.  Pulmonic Valve: The pulmonic valve was normal in structure. Pulmonic valve regurgitation is not visualized.  Aorta: The aortic root and ascending aorta are structurally normal, with no evidence of dilitation.  Venous: The inferior vena cava is dilated in size with less than 50% respiratory variability,  suggesting right atrial pressure of 15 mmHg.  IAS/Shunts: Cannot exclude a small PFO.   LEFT VENTRICLE PLAX 2D LVIDd:         4.50 cm     Diastology LVIDs:         3.80 cm     LV e' medial:    3.70 cm/s LV PW:         0.80 cm     LV E/e' medial:  28.6 LV IVS:        0.80 cm     LV e'  lateral:   6.53 cm/s LVOT diam:     1.60 cm     LV E/e' lateral: 16.2 LV SV:         23 LV SV Index:   13 LVOT Area:     2.01 cm  LV Volumes (MOD) LV vol d, MOD A2C: 86.2 ml LV vol s, MOD A2C: 51.2 ml LV SV MOD A2C:     35.0 ml  RIGHT VENTRICLE            IVC RV Basal diam:  2.80 cm    IVC diam: 2.20 cm RV S prime:     7.83 cm/s TAPSE (M-mode): 1.3 cm  LEFT ATRIUM             Index        RIGHT ATRIUM           Index LA diam:        4.20 cm 2.49 cm/m   RA Area:     15.90 cm LA Vol (A2C):   70.0 ml 41.44 ml/m  RA Volume:   40.90 ml  24.21 ml/m LA Vol (A4C):   57.2 ml 33.86 ml/m LA Biplane Vol: 63.4 ml 37.53 ml/m AORTIC VALVE LVOT Vmax:   67.70 cm/s LVOT Vmean:  43.300 cm/s LVOT VTI:    0.113 m  AORTA Ao Root diam: 2.60 cm Ao Asc diam:  3.10 cm  MITRAL VALVE                TRICUSPID VALVE MV Area (PHT): 5.84 cm     TR Peak grad:   43.8 mmHg MV Decel Time: 130 msec     TR Vmax:        331.00 cm/s MR Peak grad: 64.6 mmHg MR Vmax:      402.00 cm/s   SHUNTS MV E velocity: 106.00 cm/s  Systemic VTI:  0.11 m MV A velocity: 27.10 cm/s   Systemic Diam: 1.60 cm MV E/A ratio:  3.91  Photographer signed by Carolan Clines Signature Date/Time: 09/25/2022/9:38:22 AM    Final      CARDIAC MRI  MR CARDIAC MORPHOLOGY W WO CONTRAST 07/07/2008  Narrative Clinical Data: Nonischemic cardiomyopathy  MR CARDIA MORPHOLOGY WITHOUT AND WITH CONTRAST  GE 1.5 T magnet with dedicated cardiac coil.  FIESTA sequences to assess function and morphology.  30 cc Magnevist contrast was then given, and after 10 minutes, inversion recovery sequences were done to assess for delayed  enhancement.  EF was calculated at a dedicated workstation.  Contrast: 30 cc Magnevist  Comparison: None  Findings: Normal left ventricular size and no LV hypertrophy. There is moderate global LV systolic dysfunction, EF 38%.  The inferoposterior base appears segmentally worse.  The right ventricle appears normal in size and function.  Mild left atrial enlargement.  Normal RA size.  Trileaflet aortic valve with no stenosis.  No significant aortic regurgitation.  Mitral regurgitation visually appears mild.  Flow sequences were not done to quantify regurgitation.  There is a small area of possible mid-wall delayed enhancement in the basal inferolateral wall.  Measurements:  LV EDV 174 cc  LV SV 66 cc  LV EF 38%  IMPRESSION: 1.  Moderate LV systolic dysfunction with EF 38%.  The inferior and inferolateral basilar wall appear segmentally worse.  2.  Very small area of mid-wall delayed enhancement in the inferolateral base. This is nonspecific and can be seen with infiltrative diseases such as sarcoid as well as with myocarditis.  Provider: [employer]ic           Assessment & Plan   Acute on chronic systolic and diastolic heart failure -  bumex 2 mg IV BID, coreg 3.125 mg BID, 10 mg jardiance, entresto - 3 L urine output - she remains significantly volume up on exam, but extremities are warm - lung sounds notable worse from yesterday, BNP elevated - restart acetazolamide, may increase bumex if no improvement    Mild to moderate MR Severe TR - diuresis as above, will continue to monitor     Nonobstructive CAD - heart cath 2017 with nonobstructive disease - hs troponin      CKD  - overall stable, she has had labile creatinine in 2024   DM with hyperglycemia - A1c 11.0% - per primary  For questions or updates, please contact [facility] Please consult [email or web address] for contact info under Cardiology/STEMI.      Riley Lam, MD FASE  [facility]  [address] [place], [address] [place], [place] [address] [phone]  7:26 AM

## 2022-10-01 DIAGNOSIS — N1832 Chronic kidney disease, stage 3b: Secondary | ICD-10-CM | POA: Diagnosis not present

## 2022-10-01 DIAGNOSIS — E1169 Type 2 diabetes mellitus with other specified complication: Secondary | ICD-10-CM | POA: Diagnosis not present

## 2022-10-01 DIAGNOSIS — I5023 Acute on chronic systolic (congestive) heart failure: Secondary | ICD-10-CM | POA: Diagnosis not present

## 2022-10-01 DIAGNOSIS — I10 Essential (primary) hypertension: Secondary | ICD-10-CM | POA: Diagnosis not present

## 2022-10-01 LAB — BASIC METABOLIC PANEL WITH GFR
Anion gap: 10 (ref 5–15)
BUN: 44 mg/dL — ABNORMAL HIGH (ref 8–23)
CO2: 25 mmol/L (ref 22–32)
Calcium: 8.6 mg/dL — ABNORMAL LOW (ref 8.9–10.3)
Chloride: 100 mmol/L (ref 98–111)
Creatinine, Ser: 1.8 mg/dL — ABNORMAL HIGH (ref 0.44–1.00)
GFR, Estimated: 28 mL/min — ABNORMAL LOW (ref >60)
Glucose, Bld: 188 mg/dL — ABNORMAL HIGH (ref 70–99)
Potassium: 3.9 mmol/L (ref 3.5–5.1)
Sodium: 135 mmol/L (ref 135–145)

## 2022-10-01 LAB — GLUCOSE, CAPILLARY
Glucose-Capillary: 196 mg/dL — ABNORMAL HIGH (ref 70–99)
Glucose-Capillary: 283 mg/dL — ABNORMAL HIGH (ref 70–99)
Glucose-Capillary: 194 mg/dL — ABNORMAL HIGH (ref 70–99)
Glucose-Capillary: 265 mg/dL — ABNORMAL HIGH (ref 70–99)

## 2022-10-01 LAB — MAGNESIUM: Magnesium: 2.3 mg/dL (ref 1.7–2.4)

## 2022-10-01 MED ORDER — POTASSIUM CHLORIDE CRYS ER 20 MEQ PO TBCR
40.0000 meq | EXTENDED_RELEASE_TABLET | Freq: Two times a day (BID) | ORAL | Status: DC
  Administered 2022-10-01 – 2022-10-03 (×5): 40 meq via ORAL
  Filled 2022-10-01 (×5): qty 2

## 2022-10-01 MED ORDER — DEXTROSE 5 % IV SOLN
3.0000 mg | Freq: Two times a day (BID) | INTRAVENOUS | Status: DC
  Administered 2022-10-01 – 2022-10-02 (×3): 3 mg via INTRAVENOUS
  Filled 2022-10-01 (×5): qty 12

## 2022-10-01 NOTE — Progress Notes (Signed)
Progress Note  Patient Name: [name] Date of Encounter: 10/01/2022 Primary Cardiologist: Bryan Lemma, MD   Subjective   Still with SOB despite -23 L this admission.  Vital Signs    Vitals:   09/30/22 1917 09/30/22 2055 09/30/22 2100 10/01/22 0350  BP: (!) 101/51 (!) 113/56  125/61  Pulse: (!) 55 66 66 60  Resp: 18 20  20   Temp: 98.1 F (36.7 C) 98.3 F (36.8 C)  98.5 F (36.9 C)  TempSrc: Oral Oral  Oral  SpO2: 100% 99% 99% 100%  Weight:    51 kg  Height:        Intake/Output Summary (Last 24 hours) at 10/01/2022 0659 Last data filed at 10/01/2022 0123 Gross per 24 hour  Intake 600 ml  Output 2050 ml  Net -1450 ml   Filed Weights   09/29/22 0903 09/30/22 0441 10/01/22 0350  Weight: 56.2 kg 48.9 kg 51 kg    Physical Exam   GEN: No acute distress.   Cardiac: RRR, no rubs, or gallops. Systolic murmur Respiratory: Bilateral crackles, similar to 8/10 GI: Soft, nontender, non-distended  MS: Compression stockings without edema  Labs   Telemetry:SR, intermittent IVCD, frequent PVCs on episode of NSVT   Chemistry Recent Labs  Lab 09/25/22 0029 09/25/22 1029 09/29/22 0641 09/30/22 0110 10/01/22 0028  NA 135   < > 138 135 135  K 3.8   < > 4.1 3.5 3.9  CL 100   < > 102 99 100  CO2 25   < > 26 27 25   GLUCOSE 179*   < > 123* 254* 188*  BUN 32*   < > 36* 41* 44*  CREATININE 1.65*   < > 1.55* 1.71* 1.80*  CALCIUM 8.6*   < > 8.8* 9.0 8.6*  PROT 5.8*  --   --   --   --   ALBUMIN 3.0*  --   --   --   --   AST 32  --   --   --   --   ALT 28  --   --   --   --   ALKPHOS 90  --   --   --   --   BILITOT 1.0  --   --   --   --   GFRNONAA 32*   < > 34* 30* 28*  ANIONGAP 10   < > 10 9 10    < > = values in this interval not displayed.     Hematology Recent Labs  Lab 09/25/22 0029 09/26/22 0644 09/27/22 0222  WBC 3.1* 3.2* 3.2*  RBC 3.46* 3.83* 3.20*  HGB 9.3* 10.5* 8.6*  HCT 29.7* 33.7* 27.3*  MCV 85.8 88.0 85.3  MCH 26.9 27.4 26.9  MCHC 31.3  31.2 31.5  RDW 18.0* 18.0* 17.9*  PLT 227 237 207    Cardiac EnzymesNo results for input(s): "TROPONINI" in the last 168 hours. No results for input(s): "TROPIPOC" in the last 168 hours.   BNP Recent Labs  Lab 09/25/22 0029 09/26/22 0644 09/30/22 0110  BNP >4,500.0* 4,141.7* 2,961.3*     DDimer No results for input(s): "DDIMER" in the last 168 hours.   Cardiac Studies   Cardiac Studies & Procedures   CARDIAC CATHETERIZATION  CARDIAC CATHETERIZATION 05/08/2016  Narrative Findings:  RA = 3 RV = 34/5 PA = 38/12 (21) PCW = 12 Fick cardiac output/index = 4.6/2.6 PVR = 1.9 WU Ao sat = 96% PA sat = 58%,  62%  Assessment: 1. Well compensated filling pressures and cardiac output  Plan/Discussion:  Can go home today on:  Entresto 24/26 bid Digoxin 0.125 daily Spiro 12.5 mg daily Lasix 40mg  daily Ecasa 81mg  daily Atorva 40 mg daily.  Hold carvedilol for now  F/u HF Clinic  05/16/16 at 9am  [phone]  Bensimhon, Daniel,MD 9:43 AM   CARDIAC CATHETERIZATION  CARDIAC CATHETERIZATION 12/20/2015  Narrative Conclusions: 1. Mild to moderate, nonobstructive coronary artery disease, including 15% distal LAD stenosis, 30% ulcerated mid LCx disease, and 40% mid RCA stenosis. 2. Upper normal left ventricular filling pressure (LVEDP 15 mmHg).  Recommendations: 1. Aggressive risk factor modification including high-intensity statin therapy. 2. Continue evidence based heart failure therapy for nonischemic cardiomyopathy. 3. Hydrate gently today following procedure; restart standing furosemide tomorrow.  Yvonne Kendall, MD [facility] Pager: [phone]  Findings Coronary Findings Diagnostic  Dominance: Right  Left Main Vessel is large. Vessel is angiographically normal.  Left Anterior Descending Vessel is large.  First Diagonal Branch Vessel is small in size.  Second Diagonal Branch Vessel is small in size.  Third Diagonal Branch Vessel is  small in size.  Left Circumflex Vessel is large. The lesion is ulcerative.  First Obtuse Marginal Branch Vessel is small in size.  Second Obtuse Marginal Branch Vessel is large in size. The vessel exhibits minimal luminal irregularities.  Lateral Second Obtuse Marginal Branch The vessel exhibits minimal luminal irregularities.  Third Obtuse Marginal Branch Vessel is small in size.  Right Coronary Artery Vessel is moderate in size.  Right Posterior Descending Artery Vessel is small in size.  Right Posterior Atrioventricular Artery Vessel is small in size.  Intervention  No interventions have been documented.   STRESS TESTS  NM MYOCAR MULTI W/SPECT W 12/17/2015  Narrative CLINICAL DATA:  Chest pain with shortness of breath. History of diabetes, hypertension, smoking, congestive heart failure and Coronary artery disease.  EXAM: MYOCARDIAL IMAGING WITH SPECT (REST AND PHARMACOLOGIC-STRESS)  GATED LEFT VENTRICULAR WALL MOTION STUDY  LEFT VENTRICULAR EJECTION FRACTION  TECHNIQUE: Standard myocardial SPECT imaging was performed after resting intravenous injection of 10 mCi Tc-31m tetrofosmin. Subsequently, intravenous infusion of Lexiscan was performed under the supervision of the Cardiology staff. At peak effect of the drug, 30 mCi Tc-41m tetrofosmin was injected intravenously and standard myocardial SPECT imaging was performed. Quantitative gated imaging was also performed to evaluate left ventricular wall motion, and estimate left ventricular ejection fraction.  COMPARISON:  Portable chest 12/16/2015.  Cardiac MRI 07/07/2008.  FINDINGS: Perfusion: There is a large fixed perfusion defect involving the apex and the distal segments of the anterior and lateral walls. No reversible components are identified.  Wall Motion: Marked apical hypokinesis. There is lesser hypokinesis involving the remainder of the myocardium.  Left Ventricular Ejection Fraction: 38  %  End diastolic volume 100 ml  End systolic volume 62 ml  IMPRESSION: 1. Large infarct involving the apex, distal anterior and lateral walls. No reversal component to suggest myocardial ischemia.  2. Associated apical hypokinesis.  3. Left ventricular ejection fraction 38%  4. Non invasive risk stratification*: Intermediate  *2012 Appropriate Use Criteria for Coronary Revascularization Focused Update: J Am Coll Cardiol. 2012;59(9):857-881. http://content.dementiazones.com.aspx?articleid=1201161   Electronically Signed By: Carey Bullocks M.D. On: 12/17/2015 13:41   ECHOCARDIOGRAM  ECHOCARDIOGRAM COMPLETE 09/25/2022  Narrative ECHOCARDIOGRAM REPORT    Patient Name:   AMAZING COOLBAUGH Date of Exam: 09/25/2022 Medical Rec #:  [id number]    Height:       63.0 in Accession #:  [id number]   Weight:       145.5 lb Date of Birth:  05-14-1945    BSA:          1.689 m Patient Age:    77 years     BP:           128/75 mmHg Patient Gender: F            HR:           71 bpm. Exam Location:  Inpatient  Procedure: 2D Echo, Color Doppler and Cardiac Doppler  Indications:    acute systolic chf  History:        Patient has prior history of Echocardiogram examinations, most recent 02/27/2022.  Sonographer:    Delcie Roch RDCS Referring Phys: [phone] ERIC CHEN  IMPRESSIONS   1. Left ventricular ejection fraction, by estimation, is 30 to 35%. The left ventricle has moderately decreased function. The left ventricle demonstrates global hypokinesis. Left ventricular diastolic parameters are indeterminate. 2. Right ventricular systolic function is moderately reduced. The right ventricular size is moderately enlarged. There is moderately elevated pulmonary artery systolic pressure. 3. Left atrial size was moderately dilated. 4. Right atrial size was moderately dilated. 5. The mitral valve is degenerative. Mild to moderate mitral valve regurgitation. 6. The tricuspid valve is  degenerative. Tricuspid valve regurgitation is severe. 7. The aortic valve is normal in structure. Aortic valve regurgitation is not visualized. 8. The inferior vena cava is dilated in size with <50% respiratory variability, suggesting right atrial pressure of 15 mmHg. 9. Cannot exclude a small PFO.  Conclusion(s)/Recommendation(s): LVEF mildly worsened compared to prior echo.  FINDINGS Left Ventricle: Left ventricular ejection fraction, by estimation, is 30 to 35%. The left ventricle has moderately decreased function. The left ventricle demonstrates global hypokinesis. The left ventricular internal cavity size was normal in size. There is no left ventricular hypertrophy. Left ventricular diastolic parameters are indeterminate.  Right Ventricle: The right ventricular size is moderately enlarged. Right ventricular systolic function is moderately reduced. There is moderately elevated pulmonary artery systolic pressure. The tricuspid regurgitant velocity is 3.31 m/s, and with an assumed right atrial pressure of 5 mmHg, the estimated right ventricular systolic pressure is 48.8 mmHg.  Left Atrium: Left atrial size was moderately dilated.  Right Atrium: Right atrial size was moderately dilated.  Pericardium: There is no evidence of pericardial effusion.  Mitral Valve: The mitral valve is degenerative in appearance. Mild to moderate mitral valve regurgitation.  Tricuspid Valve: The tricuspid valve is degenerative in appearance. Tricuspid valve regurgitation is severe.  Aortic Valve: The aortic valve is normal in structure. Aortic valve regurgitation is not visualized.  Pulmonic Valve: The pulmonic valve was normal in structure. Pulmonic valve regurgitation is not visualized.  Aorta: The aortic root and ascending aorta are structurally normal, with no evidence of dilitation.  Venous: The inferior vena cava is dilated in size with less than 50% respiratory variability, suggesting right atrial  pressure of 15 mmHg.  IAS/Shunts: Cannot exclude a small PFO.   LEFT VENTRICLE PLAX 2D LVIDd:         4.50 cm     Diastology LVIDs:         3.80 cm     LV e' medial:    3.70 cm/s LV PW:         0.80 cm     LV E/e' medial:  28.6 LV IVS:        0.80 cm     LV e'  lateral:   6.53 cm/s LVOT diam:     1.60 cm     LV E/e' lateral: 16.2 LV SV:         23 LV SV Index:   13 LVOT Area:     2.01 cm  LV Volumes (MOD) LV vol d, MOD A2C: 86.2 ml LV vol s, MOD A2C: 51.2 ml LV SV MOD A2C:     35.0 ml  RIGHT VENTRICLE            IVC RV Basal diam:  2.80 cm    IVC diam: 2.20 cm RV S prime:     7.83 cm/s TAPSE (M-mode): 1.3 cm  LEFT ATRIUM             Index        RIGHT ATRIUM           Index LA diam:        4.20 cm 2.49 cm/m   RA Area:     15.90 cm LA Vol (A2C):   70.0 ml 41.44 ml/m  RA Volume:   40.90 ml  24.21 ml/m LA Vol (A4C):   57.2 ml 33.86 ml/m LA Biplane Vol: 63.4 ml 37.53 ml/m AORTIC VALVE LVOT Vmax:   67.70 cm/s LVOT Vmean:  43.300 cm/s LVOT VTI:    0.113 m  AORTA Ao Root diam: 2.60 cm Ao Asc diam:  3.10 cm  MITRAL VALVE                TRICUSPID VALVE MV Area (PHT): 5.84 cm     TR Peak grad:   43.8 mmHg MV Decel Time: 130 msec     TR Vmax:        331.00 cm/s MR Peak grad: 64.6 mmHg MR Vmax:      402.00 cm/s   SHUNTS MV E velocity: 106.00 cm/s  Systemic VTI:  0.11 m MV A velocity: 27.10 cm/s   Systemic Diam: 1.60 cm MV E/A ratio:  3.91  Photographer signed by Carolan Clines Signature Date/Time: 09/25/2022/9:38:22 AM    Final      CARDIAC MRI  MR CARDIAC MORPHOLOGY W WO CONTRAST 07/07/2008  Narrative Clinical Data: Nonischemic cardiomyopathy  MR CARDIA MORPHOLOGY WITHOUT AND WITH CONTRAST  GE 1.5 T magnet with dedicated cardiac coil.  FIESTA sequences to assess function and morphology.  30 cc Magnevist contrast was then given, and after 10 minutes, inversion recovery sequences were done to assess for delayed enhancement.  EF was calculated at  a dedicated workstation.  Contrast: 30 cc Magnevist  Comparison: None  Findings: Normal left ventricular size and no LV hypertrophy. There is moderate global LV systolic dysfunction, EF 38%.  The inferoposterior base appears segmentally worse.  The right ventricle appears normal in size and function.  Mild left atrial enlargement.  Normal RA size.  Trileaflet aortic valve with no stenosis.  No significant aortic regurgitation.  Mitral regurgitation visually appears mild.  Flow sequences were not done to quantify regurgitation.  There is a small area of possible mid-wall delayed enhancement in the basal inferolateral wall.  Measurements:  LV EDV 174 cc  LV SV 66 cc  LV EF 38%  IMPRESSION: 1.  Moderate LV systolic dysfunction with EF 38%.  The inferior and inferolateral basilar wall appear segmentally worse.  2.  Very small area of mid-wall delayed enhancement in the inferolateral base. This is nonspecific and can be seen with infiltrative diseases such as sarcoid as well as with myocarditis.  Provider: Council Mechanic           Assessment & Plan   Acute on chronic systolic and diastolic heart failure -  bumex 3 mg IV BID, coreg 3.125 mg BID, 10 mg jardiance, entresto - increased K due to increased diuretics    Mild to moderate MR Severe TR - diuresis as above, will continue to monitor     Nonobstructive CAD - heart cath 2017 with nonobstructive disease - hs troponin     CKD  - overall stable, she has had labile creatinine in 2024, has been as high as 2.22   DM with hyperglycemia - A1c 11.0% - per primary  For questions or updates, please contact [facility] Please consult [email or web address] for contact info under Cardiology/STEMI.      Riley Lam, MD FASE [facility] Cardiologist [facility]  [address] [place], [address] [place], [place] [address] [phone]  6:59 AM

## 2022-10-01 NOTE — Progress Notes (Signed)
  Progress Note   Patient: [name] XWR:[id number] DOB: 07/03/1945 DOA: 09/24/2022     6 DOS: the patient was seen and examined on 10/01/2022   Brief hospital course: [name] was admitted to the hospital with the working diagnosis of heart failure exacerbation.   76/F with chronic systolic CHF, NICM EF 35%, CKD 3B, type 2 diabetes mellitus, COPD presented to the ED with progressive dyspnea on exertion and worsening lower extremity edema X1 month, despite compliance with diuretics, recently her Lasix dose has been increased to 80 Mg twice daily, she has gained 22 pounds fluid weight over the last few months. In the ED vital signs stable, BNP> 4500, creatinine 1.6, albumin 3.0, hemoglobin 9.3, chest x-ray with mild pulmonary edema   Assessment and Plan: * Acute on chronic systolic CHF (congestive heart failure) (HCC) Echocardiogram with reduced LV systolic function EF 30 to 35%, global hypokinesis, no LVH, RV systolic function with moderate reduction,  RV with moderate dilatation, RVSP 48.8, LA and RA with moderate dilatation, mild to moderate MR, severe TR.   Urine output 2,050  ml Systolic blood pressure 116  to 125 mmHg.  Plan to continue diuresis with IV bumetanide 3 mg bid.  Augment diuresis with acetazolamide.  Empagliflozin,spironolactone and carvedilol.  Entresto. Add ted hose  CKD stage 3b, GFR 30-44 ml/min (HCC) Volume status improving.  Today serum cr is 1,80 with K at 3,9 and serum bicarbonate at 25. Na 135 Mg 2.3   Diuresis with bumetanide, spironolactone and SGLT 2 inh Follow up renal function in am. Add extra 40 kcl x2 to prevent hypokalemia.   Essential hypertension Continue blood pressure control with entresto and carvedilol.  Diuresis with bumetanide.   Type 2 diabetes mellitus with hyperlipidemia (HCC) Uncontrolled T2Dm with hyperglycemia/ hypoglycemia.   Fasting glucose today 118  Plan to continue glucose cover and monitoring with insulin sliding scale.   Basal insulin with 5 units.  Patient is tolerating po well.   Continue with statin.   PAD (peripheral artery disease) (HCC) Continue blood pressure control. Continue statin and aspirin.   Iron deficiency anemia Serum iron 21, TIBC 375, transferrin saturation 6, ferritin 36. SP IV iron infusion.        Subjective: Patient is feeling better, continue very weak and deconditioned, but dyspnea and edema are improving   Physical Exam: Vitals:   09/30/22 2100 10/01/22 0350 10/01/22 0809 10/01/22 1208  BP:  125/61 134/64 116/60  Pulse: 66 60    Resp:  20 18 18   Temp:  98.5 F (36.9 C) 98.2 F (36.8 C)   TempSrc:  Oral Oral   SpO2: 99% 100%    Weight:  51 kg    Height:       Neurology awake and alert ENT with mild pallor Cardiovascular with S1 and S2 present and regular with no gallops, rubs or murmurs No JVD No lower extremity edema Respiratory with no rales or wheezing on anterior auscultation  Abdomen with no distention  Data Reviewed:    Family Communication: no family at the beside   Disposition: Status is: Inpatient Remains inpatient appropriate because: diuresis   Planned Discharge Destination: Home    Author: Coralie Keens, MD 10/01/2022 12:40 PM  For on call review [email or web address].

## 2022-10-02 ENCOUNTER — Ambulatory Visit: Payer: Medicare Other | Admitting: Adult Health

## 2022-10-02 DIAGNOSIS — I5023 Acute on chronic systolic (congestive) heart failure: Secondary | ICD-10-CM | POA: Diagnosis not present

## 2022-10-02 DIAGNOSIS — I10 Essential (primary) hypertension: Secondary | ICD-10-CM | POA: Diagnosis not present

## 2022-10-02 DIAGNOSIS — N1832 Chronic kidney disease, stage 3b: Secondary | ICD-10-CM | POA: Diagnosis not present

## 2022-10-02 DIAGNOSIS — E1169 Type 2 diabetes mellitus with other specified complication: Secondary | ICD-10-CM | POA: Diagnosis not present

## 2022-10-02 LAB — BASIC METABOLIC PANEL
Anion gap: 13 (ref 5–15)
BUN: 43 mg/dL — ABNORMAL HIGH (ref 8–23)
CO2: 19 mmol/L — ABNORMAL LOW (ref 22–32)
Calcium: 8.7 mg/dL — ABNORMAL LOW (ref 8.9–10.3)
Chloride: 105 mmol/L (ref 98–111)
Creatinine, Ser: 1.59 mg/dL — ABNORMAL HIGH (ref 0.44–1.00)
GFR, Estimated: 33 mL/min — ABNORMAL LOW (ref >60)
Glucose, Bld: 312 mg/dL — ABNORMAL HIGH (ref 70–99)
Potassium: 4.5 mmol/L (ref 3.5–5.1)
Sodium: 137 mmol/L (ref 135–145)

## 2022-10-02 LAB — GLUCOSE, CAPILLARY
Glucose-Capillary: 291 mg/dL — ABNORMAL HIGH (ref 70–99)
Glucose-Capillary: 295 mg/dL — ABNORMAL HIGH (ref 70–99)
Glucose-Capillary: 236 mg/dL — ABNORMAL HIGH (ref 70–99)
Glucose-Capillary: 258 mg/dL — ABNORMAL HIGH (ref 70–99)

## 2022-10-02 LAB — MAGNESIUM: Magnesium: 2.6 mg/dL — ABNORMAL HIGH (ref 1.7–2.4)

## 2022-10-02 MED ORDER — INSULIN ASPART 100 UNIT/ML IJ SOLN
0.0000 [IU] | Freq: Three times a day (TID) | INTRAMUSCULAR | Status: DC
  Administered 2022-10-02: 3 [IU] via SUBCUTANEOUS
  Administered 2022-10-03 (×2): 2 [IU] via SUBCUTANEOUS

## 2022-10-02 NOTE — Inpatient Diabetes Management (Signed)
Inpatient Diabetes Program Recommendations  AACE/ADA: New Consensus Statement on Inpatient Glycemic Control (2015)  Target Ranges:  Prepandial:   less than 140 mg/dL      Peak postprandial:   less than 180 mg/dL (1-2 hours)      Critically ill patients:  140 - 180 mg/dL   Lab Results  Component Value Date   GLUCAP 291 (H) 10/02/2022   HGBA1C 11.0 (H) 09/26/2022    Review of Glycemic Control  Latest Reference Range & Units 10/01/22 06:47 10/01/22 12:07 10/01/22 16:36 10/01/22 21:42 10/02/22 07:59  Glucose-Capillary 70 - 99 mg/dL 086 (H) 578 (H) 469 (H) 265 (H) 291 (H)  (H): Data is abnormally high  Diabetes history: DM2 Outpatient Diabetes medications: 70/30 25 units QPM Current orders for Inpatient glycemic control:  Semglee 5 units, Novolog 0-9 units TID and 0-5 units at bedtime, Jardiance 10 mg QD  Inpatient Diabetes Program Recommendations:    Might consider:  Semglee 10 units every day  Will continue to follow while inpatient.  Thank you, Dulce Sellar, MSN, CDCES Diabetes Coordinator Inpatient Diabetes Program [phone] (team pager from 8a-5p)

## 2022-10-02 NOTE — Progress Notes (Signed)
Physical Therapy Treatment Patient Details Name: [name] MRN: [id number] DOB: May 30, 1945 Today's Date: 10/02/2022   History of Present Illness 77 y.o. female presents to [facility] hospital on 09/24/2022 with worsening LE edema. Admitted for acute on chronic CHF. PMH includes breast CA, CHF, CAD, DM, NICM.    PT Comments  Pt admitted with above diagnosis. Pt was able to ambulate with RW with min guard to min assist with some cues for safety.  Recommend HHPT f/u for safety as pt demonstrates some issues with safety at times. Pt assisted with toileting during session as well. Pt adamant she is going home.   Pt currently with functional limitations due to the deficits listed below (see PT Problem List). Pt will benefit from acute skilled PT to increase their independence and safety with mobility to allow discharge.       If plan is discharge home, recommend the following: Assist for transportation;Help with stairs or ramp for entrance;Assistance with cooking/housework   Can travel by private vehicle        Equipment Recommendations  None recommended by PT    Recommendations for Other Services       Precautions / Restrictions Precautions Precautions: Fall Restrictions Weight Bearing Restrictions: No     Mobility  Bed Mobility Overal bed mobility: Needs Assistance Bed Mobility: Supine to Sit     Supine to sit: Supervision, HOB elevated     General bed mobility comments: incr time    Transfers Overall transfer level: Needs assistance Equipment used: None Transfers: Sit to/from Stand Sit to Stand: Min assist           General transfer comment: verbal cues for hand placement    Ambulation/Gait Ambulation/Gait assistance: Min assist Gait Distance (Feet): 300 Feet Assistive device: Rolling walker (2 wheels) Gait Pattern/deviations: Step-through pattern, Decreased stride length Gait velocity: functional Gait velocity interpretation: 1.31 - 2.62 ft/sec, indicative of limited  community ambulator   General Gait Details: slowed step-through gait with occasional cues to stay close to rW. Overall safe with cues.  Pt appears to be a little more unsteady today.   Stairs             Wheelchair Mobility     Tilt Bed    Modified Rankin (Stroke Patients Only)       Balance Overall balance assessment: Needs assistance Sitting-balance support: No upper extremity supported, Feet supported Sitting balance-Leahy Scale: Good     Standing balance support: Bilateral upper extremity supported, During functional activity, Reliant on assistive device for balance Standing balance-Leahy Scale: Poor Standing balance comment: RW support                            Cognition Arousal: Alert Behavior During Therapy: WFL for tasks assessed/performed Overall Cognitive Status: Within Functional Limits for tasks assessed                                 General Comments: delayed responses, likely HOH vs cognition, pt following commands appropriately        Exercises      General Comments General comments (skin integrity, edema, etc.): VSS on RA      Pertinent Vitals/Pain Pain Assessment Pain Assessment: No/denies pain    Home Living  Prior Function            PT Goals (current goals can now be found in the care plan section) Acute Rehab PT Goals Patient Stated Goal: to go home Progress towards PT goals: Progressing toward goals    Frequency    Min 1X/week      PT Plan      Co-evaluation              AM-PAC PT "6 Clicks" Mobility   Outcome Measure  Help needed turning from your back to your side while in a flat bed without using bedrails?: A Little Help needed moving from lying on your back to sitting on the side of a flat bed without using bedrails?: A Little Help needed moving to and from a bed to a chair (including a wheelchair)?: A Little Help needed standing up from a  chair using your arms (e.g., wheelchair or bedside chair)?: A Little Help needed to walk in hospital room?: A Little Help needed climbing 3-5 steps with a railing? : A Lot 6 Click Score: 17    End of Session Equipment Utilized During Treatment: Gait belt Activity Tolerance: Patient tolerated treatment well Patient left: with call bell/phone within reach;in bed;with bed alarm set Nurse Communication: Mobility status PT Visit Diagnosis: Other abnormalities of gait and mobility (R26.89);Muscle weakness (generalized) (M62.81)     Time: 1027-2536 PT Time Calculation (min) (ACUTE ONLY): 13 min  Charges:    $Gait Training: 8-22 mins PT General Charges $$ ACUTE PT VISIT: 1 Visit                      M,PT Acute Rehab Services [phone]    Bevelyn Buckles 10/02/2022, 3:11 PM

## 2022-10-02 NOTE — Plan of Care (Signed)

## 2022-10-02 NOTE — Plan of Care (Signed)
  Problem: Education: Goal: Ability to demonstrate management of disease process will improve Outcome: Progressing Goal: Ability to verbalize understanding of medication therapies will improve Outcome: Progressing   Problem: Activity: Goal: Capacity to carry out activities will improve Outcome: Progressing   Problem: Cardiac: Goal: Ability to achieve and maintain adequate cardiopulmonary perfusion will improve Outcome: Progressing   

## 2022-10-02 NOTE — Progress Notes (Signed)
Rounding Note    Patient Name: [name] Date of Encounter: 10/02/2022  [facility] Cardiologist: Bryan Lemma, MD   Subjective   No significant complaints today.  Denies any shortness of breath.  Peers to be very close to her baseline.  Appears a little bit weak today but ambulates well with physical therapy. Inpatient Medications    Scheduled Meds:  acetaZOLAMIDE  500 mg Oral BID   aspirin EC  81 mg Oral Daily   atorvastatin  40 mg Oral Daily   carvedilol  3.125 mg Oral BID WC   empagliflozin  10 mg Oral Daily   heparin  5,000 Units Subcutaneous Q8H   insulin aspart  0-5 Units Subcutaneous QHS   insulin aspart  0-9 Units Subcutaneous TID WC   insulin glargine-yfgn  5 Units Subcutaneous Daily   potassium chloride  40 mEq Oral BID   sacubitril-valsartan  1 tablet Oral BID   spironolactone  12.5 mg Oral Daily   Continuous Infusions:  bumetanide (BUMEX) IV 3 mg (10/01/22 1746)   PRN Meds: acetaminophen **OR** acetaminophen, guaiFENesin-dextromethorphan, melatonin, ondansetron **OR** ondansetron (ZOFRAN) IV   Vital Signs    Vitals:   10/01/22 2017 10/01/22 2359 10/02/22 0346 10/02/22 0500  BP: 112/60 124/64 122/64   Pulse: 67 61    Resp: 18 18 18    Temp: 97.9 F (36.6 C) 97.9 F (36.6 C) (!) 97 F (36.1 C)   TempSrc: Oral Oral Oral   SpO2: 94% 100% 100%   Weight:    51.6 kg  Height:        Intake/Output Summary (Last 24 hours) at 10/02/2022 0724 Last data filed at 10/02/2022 0352 Gross per 24 hour  Intake 66.19 ml  Output 1850 ml  Net -1783.81 ml      10/02/2022    5:00 AM 10/01/2022    3:50 AM 09/30/2022    4:41 AM  Last 3 Weights  Weight (lbs) 113 lb 12.1 oz 112 lb 6.4 oz 107 lb 14.4 oz  Weight (kg) 51.6 kg 50.984 kg 48.943 kg      Telemetry    Normal sinus rhythm heart rate in the 60s.  Frequent ectopy and PVCs/PACs.- Personally Reviewed  ECG    No new tracings- Personally Reviewed  Physical Exam   GEN: No acute distress.    Neck: No JVD Cardiac: RRR, no murmurs, rubs, or gallops.  Respiratory: Coarse breath sounds more in the middle to lower lobes GI: Soft, nontender, non-distended  MS: No edema Neuro:  Nonfocal  Psych: Normal affect   Labs    High Sensitivity Troponin:   Recent Labs  Lab 09/25/22 0029 09/25/22 0132  TROPONINIHS 1,538* 1,438*     Chemistry Recent Labs  Lab 09/29/22 0641 09/30/22 0110 10/01/22 0028  NA 138 135 135  K 4.1 3.5 3.9  CL 102 99 100  CO2 26 27 25   GLUCOSE 123* 254* 188*  BUN 36* 41* 44*  CREATININE 1.55* 1.71* 1.80*  CALCIUM 8.8* 9.0 8.6*  MG 2.3 2.3 2.3  GFRNONAA 34* 30* 28*  ANIONGAP 10 9 10     Lipids  No results for input(s): "CHOL", "TRIG", "HDL", "LABVLDL", "LDLCALC", "CHOLHDL" in the last 168 hours.   Hematology Recent Labs  Lab 09/26/22 0644 09/27/22 0222  WBC 3.2* 3.2*  RBC 3.83* 3.20*  HGB 10.5* 8.6*  HCT 33.7* 27.3*  MCV 88.0 85.3  MCH 27.4 26.9  MCHC 31.2 31.5  RDW 18.0* 17.9*  PLT 237 207  Thyroid  No results for input(s): "TSH", "FREET4" in the last 168 hours.   BNP Recent Labs  Lab 09/26/22 0644 09/30/22 0110  BNP 4,141.7* 2,961.3*    DDimer No results for input(s): "DDIMER" in the last 168 hours.   Radiology    No results found.  Cardiac Studies   Echocardiogram 02/27/2022  1. Left ventricular ejection fraction, by estimation, is 35 to 40%. The  left ventricle has moderately decreased function. The left ventricle  demonstrates global hypokinesis. The left ventricular internal cavity size  was moderately dilated. Left  ventricular diastolic parameters are consistent with Grade III diastolic  dysfunction (restrictive).   2. Right ventricular systolic function is low normal. The right  ventricular size is moderately enlarged. There is mildly elevated  pulmonary artery systolic pressure. The estimated right ventricular  systolic pressure is 39.1 mmHg.   3. Left atrial size was moderately dilated.   4. Right atrial  size was moderately dilated.   5. The mitral valve is degenerative. Mild mitral valve regurgitation. No  evidence of mitral stenosis.   6. The aortic valve is tricuspid. Aortic valve regurgitation is not  visualized. No aortic stenosis is present.   7. The inferior vena cava is normal in size with <50% respiratory  variability, suggesting right atrial pressure of 8 mmHg.   8. Evidence of atrial level shunting detected by color flow Doppler.  There is a small patent foramen ovale with predominantly left to right  shunting across the atrial septum.   Comparison(s): The left ventricular function is worsened. Prior EF 50-55%.   Patient Profile     77 y.o. female with history of DM type II, hypertension, hyperlipidemia, CKD, COPD, chronic HFrEF with an EF of 35 to 40% in January 2024, bilateral PAD.  Patient admitted for worsening symptoms of peripheral edema, cough, shortness of breath, significant weight gain 25+ pounds.  Patient with history of reduced EF as low as 20% in 2018 thought likely nonischemic due to nonobstructive CAD noted on catheterization in 2018. She has had labile EF over the years increased to 50 to 55% 09/2020, however most recent echo in January 2024 showed EF 35 to 40% with mild RV dysfunction.  Grade 3 diastolic dysfunction.  RVSP 39.  Assessment & Plan    Acute on chronic HFrEF Weight on admission 150.8.  Patient reports baseline weight to being around 125. Echocardiogram during this admission shows mildy reduced EF of 30 to 35% compared to prior study in January.  She has moderately reduced RV function.  Patient has had significant diuresis this admission over 25 L.  Current weight 113 pounds.  Appears to be close to euvolemia however still has coarse breath sounds.  Will give morning dose of diuretics and hold afternoon dose with reevaluation.  Hopefully can transition to p.o. diuretics tomorrow. Continue acetazolamide 500 mg twice daily, Bumex 3 mg twice daily,  carvedilol 3.125 mg twice daily, Jardiance 10 mg Entresto 24-26 mg twice daily, spironolactone 12.5 mg.  Seems to be compensating decently right now.  Blood pressures have been stable. Some concern for poor CO initially. Perfusing well today and warm. Titrate GDMT once sufficiently diuresed.  Hypokalemia Potassium 3.9.  Will supplement with 40 mEq twice daily.  Nonobstructive CAD Elevated troponins 1538, 1438 History of nonobstructive disease on cardiac catheterization in 2017.   Continue aspirin and atorvastatin 40 mg.  LDL 50.  CKD stage III Creatinine seems stable here,  1.65 on admission.  Currently 1.8  Baseline unclear  however seems to range between 1.2-1.8 on ferrilecit.   Normal sinus rhythm with frequent PVCs and ectopy Could be wall tension from hypervolemia causing ectopy.  Ectopy has improved and less frequent after diuresis.   Mild to moderate MR/severe TR Likely due to volume.  Diuresis as above  For questions or updates, please contact [facility] Please consult [email or web address] for contact info under        Signed, Abagail Kitchens, PA-C  10/02/2022, 7:24 AM

## 2022-10-02 NOTE — Progress Notes (Signed)
  Progress Note   Patient: [name] WUJ:[id number] DOB: [birth year] DOA: 09/24/2022     7 DOS: the patient was seen and examined on 10/02/2022   Brief hospital course: [name] was admitted to the hospital with the working diagnosis of heart failure exacerbation.   76/F with chronic systolic CHF, NICM EF 35%, CKD 3B, type 2 diabetes mellitus, COPD presented to the ED with progressive dyspnea on exertion and worsening lower extremity edema X1 month, despite compliance with diuretics, recently her Lasix dose has been increased to 80 Mg twice daily, she has gained 22 pounds fluid weight over the last few months. In the ED vital signs stable, BNP> 4500, creatinine 1.6, albumin 3.0, hemoglobin 9.3, chest x-ray with mild pulmonary edema   Assessment and Plan: * Acute on chronic systolic CHF (congestive heart failure) (HCC) Echocardiogram with reduced LV systolic function EF 30 to 35%, global hypokinesis, no LVH, RV systolic function with moderate reduction,  RV with moderate dilatation, RVSP 48.8, LA and RA with moderate dilatation, mild to moderate MR, severe TR.   Urine output 1,850  ml Systolic blood pressure 113  to 126 mmHg.  Plan to continue diuresis with IV bumetanide 3 mg bid.  Augment diuresis with acetazolamide.  Empagliflozin,spironolactone and carvedilol.  Entresto. Ted hose in place.   CKD stage 3b, GFR 30-44 ml/min (HCC) Volume status improving.  Renal function with serum cr at 1,59, K is 4,5 and serum bicarbonate at 19,  Na 137 and Mg 2,6    Diuresis with bumetanide, spironolactone and SGLT 2 inh Follow up renal function in am. Add extra 40 kcl x2 to prevent hypokalemia.   Essential hypertension Continue blood pressure control with entresto and carvedilol.  Diuresis with bumetanide.   Type 2 diabetes mellitus with hyperlipidemia (HCC) Uncontrolled T2Dm with hyperglycemia/ hypoglycemia.   Fasting glucose today 312 Capillary 194, 265, 291 and 295   Will increase dose  of insulin sliding scale.  Basal insulin with 5 units.  Patient is tolerating po well.   Continue with statin.   PAD (peripheral artery disease) (HCC) Continue blood pressure control. Continue statin and aspirin.   Iron deficiency anemia Serum iron 21, TIBC 375, transferrin saturation 6, ferritin 36. SP IV iron infusion.        Subjective: Patient is feeling better, dyspnea and edema has been improving, no chest pain, she has been weak and deconditioned   Physical Exam: Vitals:   10/02/22 0346 10/02/22 0500 10/02/22 0823 10/02/22 0857  BP: 122/64   113/88  Pulse:   64 67  Resp: 18  18   Temp: (!) 97 F (36.1 C)  97.7 F (36.5 C)   TempSrc: Oral  Oral   SpO2: 100%  98%   Weight:  51.6 kg    Height:       Neurology awake and alert ENT with mild pallor Cardiovascular with S1 and S2 present and regular with no gallops, rubs or murmurs Mild JVD Respiratory with scattered rales with no wheezing Abdomen with no distention  No lower extremity edema  Data Reviewed:    Family Communication: no family at the bedside   Disposition: Status is: Inpatient Remains inpatient appropriate because: diuresis   Planned Discharge Destination: Home     Author: Coralie Keens, MD 10/02/2022 2:14 PM  For on call review [email or web address].

## 2022-10-02 NOTE — Progress Notes (Signed)
Progress Note  Patient Name: [name] Date of Encounter: 10/02/2022 Primary Cardiologist: Bryan Lemma, MD   Subjective   Fatigued this AM  Vital Signs    Vitals:   10/02/22 0346 10/02/22 0500 10/02/22 0823 10/02/22 0857  BP: 122/64   113/88  Pulse:   64 67  Resp: 18  18   Temp: (!) 97 F (36.1 C)  97.7 F (36.5 C)   TempSrc: Oral  Oral   SpO2: 100%  98%   Weight:  51.6 kg    Height:        Intake/Output Summary (Last 24 hours) at 10/02/2022 1037 Last data filed at 10/02/2022 0352 Gross per 24 hour  Intake 66.19 ml  Output 1250 ml  Net -1183.81 ml   Filed Weights   09/30/22 0441 10/01/22 0350 10/02/22 0500  Weight: 48.9 kg 51 kg 51.6 kg    Physical Exam   GEN: No acute distress.   Cardiac: RRR, no rubs, or gallops. Systolic murmur Respiratory: nl wob, Bilateral crackles GI: Soft, nontender, non-distended  MS: Compression stockings without edema  Labs   Telemetry: SR   Chemistry Recent Labs  Lab 09/29/22 0641 09/30/22 0110 10/01/22 0028  NA 138 135 135  K 4.1 3.5 3.9  CL 102 99 100  CO2 26 27 25   GLUCOSE 123* 254* 188*  BUN 36* 41* 44*  CREATININE 1.55* 1.71* 1.80*  CALCIUM 8.8* 9.0 8.6*  GFRNONAA 34* 30* 28*  ANIONGAP 10 9 10      Hematology Recent Labs  Lab 09/26/22 0644 09/27/22 0222  WBC 3.2* 3.2*  RBC 3.83* 3.20*  HGB 10.5* 8.6*  HCT 33.7* 27.3*  MCV 88.0 85.3  MCH 27.4 26.9  MCHC 31.2 31.5  RDW 18.0* 17.9*  PLT 237 207    Cardiac EnzymesNo results for input(s): "TROPONINI" in the last 168 hours. No results for input(s): "TROPIPOC" in the last 168 hours.   BNP Recent Labs  Lab 09/26/22 0644 09/30/22 0110  BNP 4,141.7* 2,961.3*     DDimer No results for input(s): "DDIMER" in the last 168 hours.   Cardiac Studies   Cardiac Studies & Procedures   CARDIAC CATHETERIZATION  CARDIAC CATHETERIZATION 05/08/2016  Narrative Findings:  RA = 3 RV = 34/5 PA = 38/12 (21) PCW = 12 Fick cardiac output/index =  4.6/2.6 PVR = 1.9 WU Ao sat = 96% PA sat = 58%, 62%  Assessment: 1. Well compensated filling pressures and cardiac output  Plan/Discussion:  Can go home today on:  Entresto 24/26 bid Digoxin 0.125 daily Spiro 12.5 mg daily Lasix 40mg  daily Ecasa 81mg  daily Atorva 40 mg daily.  Hold carvedilol for now  F/u [facility]  05/16/16 at 9am  [phone]  Bensimhon, Daniel,MD 9:43 AM   CARDIAC CATHETERIZATION  CARDIAC CATHETERIZATION 12/20/2015  Narrative Conclusions: 1. Mild to moderate, nonobstructive coronary artery disease, including 15% distal LAD stenosis, 30% ulcerated mid LCx disease, and 40% mid RCA stenosis. 2. Upper normal left ventricular filling pressure (LVEDP 15 mmHg).  Recommendations: 1. Aggressive risk factor modification including high-intensity statin therapy. 2. Continue evidence based heart failure therapy for nonischemic cardiomyopathy. 3. Hydrate gently today following procedure; restart standing furosemide tomorrow.  Yvonne Kendall, MD [facility] Pager: [phone]  Findings Coronary Findings Diagnostic  Dominance: Right  Left Main Vessel is large. Vessel is angiographically normal.  Left Anterior Descending Vessel is large.  First Diagonal  Vessel is small in size.  Second Diagonal  Vessel is small in size.  Third Diagonal  Vessel is small in size.  Left Circumflex Vessel is large. The lesion is ulcerative.  First Obtuse Marginal  Vessel is small in size.  Second Obtuse Marginal  Vessel is large in size. The vessel exhibits minimal luminal irregularities.  Lateral Second Obtuse Marginal  The vessel exhibits minimal luminal irregularities.  Third Obtuse Marginal  Vessel is small in size.  Right Coronary Artery Vessel is moderate in size.  Right Posterior Descending Artery Vessel is small in size.  Right Posterior Atrioventricular Artery Vessel is small in  size.  Intervention  No interventions have been documented.   STRESS TESTS  NM MYOCAR MULTI W/SPECT W 12/17/2015  Narrative CLINICAL DATA:  Chest pain with shortness of breath. History of diabetes, hypertension, smoking, congestive heart failure and Coronary artery disease.  EXAM: MYOCARDIAL IMAGING WITH SPECT (REST AND PHARMACOLOGIC-STRESS)  GATED LEFT VENTRICULAR WALL MOTION STUDY  LEFT VENTRICULAR EJECTION FRACTION  TECHNIQUE: Standard myocardial SPECT imaging was performed after resting intravenous injection of 10 mCi Tc-77m tetrofosmin. Subsequently, intravenous infusion of Lexiscan was performed under the supervision of the Cardiology staff. At peak effect of the drug, 30 mCi Tc-36m tetrofosmin was injected intravenously and standard myocardial SPECT imaging was performed. Quantitative gated imaging was also performed to evaluate left ventricular wall motion, and estimate left ventricular ejection fraction.  COMPARISON:  Portable chest 12/16/2015.  Cardiac MRI 07/07/2008.  FINDINGS: Perfusion: There is a large fixed perfusion defect involving the apex and the distal segments of the anterior and lateral walls. No reversible components are identified.  Wall Motion: Marked apical hypokinesis. There is lesser hypokinesis involving the remainder of the myocardium.  Left Ventricular Ejection Fraction: 38 %  End diastolic volume 100 ml  End systolic volume 62 ml  IMPRESSION: 1. Large infarct involving the apex, distal anterior and lateral walls. No reversal component to suggest myocardial ischemia.  2. Associated apical hypokinesis.  3. Left ventricular ejection fraction 38%  4. Non invasive risk stratification*: Intermediate  *2012 Appropriate Use Criteria for Coronary Revascularization Focused Update: J Am Coll Cardiol. 2012;59(9):857-881. http://content.dementiazones.com.aspx?articleid=1201161   Electronically Signed By: Carey Bullocks M.D. On:  12/17/2015 13:41   ECHOCARDIOGRAM  ECHOCARDIOGRAM COMPLETE 09/25/2022  Narrative ECHOCARDIOGRAM REPORT    Patient Name:   [name] Date of Exam: 09/25/2022 Medical Rec #:  [id number]    Height:       63.0 in Accession #:    [id number]   Weight:       145.5 lb Date of Birth:  1945/06/04    BSA:          1.689 m Patient Age:    77 years     BP:           128/75 mmHg Patient Gender: F            HR:           71 bpm. Exam Location:  Inpatient  Procedure: 2D Echo, Color Doppler and Cardiac Doppler  Indications:    acute systolic chf  History:        Patient has prior history of Echocardiogram examinations, most recent 02/27/2022.  Sonographer:    Delcie Roch RDCS Referring Phys: [id number] ERIC CHEN  IMPRESSIONS   1. Left ventricular ejection fraction, by estimation, is 30 to 35%. The left ventricle has moderately decreased function. The left ventricle demonstrates global hypokinesis. Left ventricular diastolic parameters are indeterminate. 2. Right ventricular systolic function is moderately reduced. The right ventricular size is moderately  enlarged. There is moderately elevated pulmonary artery systolic pressure. 3. Left atrial size was moderately dilated. 4. Right atrial size was moderately dilated. 5. The mitral valve is degenerative. Mild to moderate mitral valve regurgitation. 6. The tricuspid valve is degenerative. Tricuspid valve regurgitation is severe. 7. The aortic valve is normal in structure. Aortic valve regurgitation is not visualized. 8. The inferior vena cava is dilated in size with <50% respiratory variability, suggesting right atrial pressure of 15 mmHg. 9. Cannot exclude a small PFO.  Conclusion(s)/Recommendation(s): LVEF mildly worsened compared to prior echo.  FINDINGS Left Ventricle: Left ventricular ejection fraction, by estimation, is 30 to 35%. The left ventricle has moderately decreased function. The left ventricle demonstrates global hypokinesis. The  left ventricular internal cavity size was normal in size. There is no left ventricular hypertrophy. Left ventricular diastolic parameters are indeterminate.  Right Ventricle: The right ventricular size is moderately enlarged. Right ventricular systolic function is moderately reduced. There is moderately elevated pulmonary artery systolic pressure. The tricuspid regurgitant velocity is 3.31 m/s, and with an assumed right atrial pressure of 5 mmHg, the estimated right ventricular systolic pressure is 48.8 mmHg.  Left Atrium: Left atrial size was moderately dilated.  Right Atrium: Right atrial size was moderately dilated.  Pericardium: There is no evidence of pericardial effusion.  Mitral Valve: The mitral valve is degenerative in appearance. Mild to moderate mitral valve regurgitation.  Tricuspid Valve: The tricuspid valve is degenerative in appearance. Tricuspid valve regurgitation is severe.  Aortic Valve: The aortic valve is normal in structure. Aortic valve regurgitation is not visualized.  Pulmonic Valve: The pulmonic valve was normal in structure. Pulmonic valve regurgitation is not visualized.  Aorta: The aortic root and ascending aorta are structurally normal, with no evidence of dilitation.  Venous: The inferior vena cava is dilated in size with less than 50% respiratory variability, suggesting right atrial pressure of 15 mmHg.  IAS/Shunts: Cannot exclude a small PFO.   LEFT VENTRICLE PLAX 2D LVIDd:         4.50 cm     Diastology LVIDs:         3.80 cm     LV e' medial:    3.70 cm/s LV PW:         0.80 cm     LV E/e' medial:  28.6 LV IVS:        0.80 cm     LV e' lateral:   6.53 cm/s LVOT diam:     1.60 cm     LV E/e' lateral: 16.2 LV SV:         23 LV SV Index:   13 LVOT Area:     2.01 cm  LV Volumes (MOD) LV vol d, MOD A2C: 86.2 ml LV vol s, MOD A2C: 51.2 ml LV SV MOD A2C:     35.0 ml  RIGHT VENTRICLE            IVC RV Basal diam:  2.80 cm    IVC diam: 2.20  cm RV S prime:     7.83 cm/s TAPSE (M-mode): 1.3 cm  LEFT ATRIUM             Index        RIGHT ATRIUM           Index LA diam:        4.20 cm 2.49 cm/m   RA Area:     15.90 cm LA Vol (A2C):   70.0 ml 41.44 ml/m  RA Volume:   40.90 ml  24.21 ml/m LA Vol (A4C):   57.2 ml 33.86 ml/m LA Biplane Vol: 63.4 ml 37.53 ml/m AORTIC VALVE LVOT Vmax:   67.70 cm/s LVOT Vmean:  43.300 cm/s LVOT VTI:    0.113 m  AORTA Ao Root diam: 2.60 cm Ao Asc diam:  3.10 cm  MITRAL VALVE                TRICUSPID VALVE MV Area (PHT): 5.84 cm     TR Peak grad:   43.8 mmHg MV Decel Time: 130 msec     TR Vmax:        331.00 cm/s MR Peak grad: 64.6 mmHg MR Vmax:      402.00 cm/s   SHUNTS MV E velocity: 106.00 cm/s  Systemic VTI:  0.11 m MV A velocity: 27.10 cm/s   Systemic Diam: 1.60 cm MV E/A ratio:  3.91  Photographer signed by Carolan Clines Signature Date/Time: 09/25/2022/9:38:22 AM    Final      CARDIAC MRI  MR CARDIAC MORPHOLOGY W WO CONTRAST 07/07/2008  Narrative Clinical Data: Nonischemic cardiomyopathy  MR CARDIA MORPHOLOGY WITHOUT AND WITH CONTRAST  GE 1.5 T magnet with dedicated cardiac coil.  FIESTA sequences to assess function and morphology.  30 cc Magnevist contrast was then given, and after 10 minutes, inversion recovery sequences were done to assess for delayed enhancement.  EF was calculated at a dedicated workstation.  Contrast: 30 cc Magnevist  Comparison: None  Findings: Normal left ventricular size and no LV hypertrophy. There is moderate global LV systolic dysfunction, EF 38%.  The inferoposterior base appears segmentally worse.  The right ventricle appears normal in size and function.  Mild left atrial enlargement.  Normal RA size.  Trileaflet aortic valve with no stenosis.  No significant aortic regurgitation.  Mitral regurgitation visually appears mild.  Flow sequences were not done to quantify regurgitation.  There is a small area of possible  mid-wall delayed enhancement in the basal inferolateral wall.  Measurements:  LV EDV 174 cc  LV SV 66 cc  LV EF 38%  IMPRESSION: 1.  Moderate LV systolic dysfunction with EF 38%.  The inferior and inferolateral basilar wall appear segmentally worse.  2.  Very small area of mid-wall delayed enhancement in the inferolateral base. This is nonspecific and can be seen with infiltrative diseases such as sarcoid as well as with myocarditis.  Provider: [employer]           Assessment & Plan   Acute on chronic non ischemic systolic and diastolic heart failure -  continue bumex 3 mg IV BID, coreg 3.125 mg BID, 10 mg jardiance, entresto - K>4, Mg>2; labs pending - net negative 25L total; 1.7 L. Weight  up and down up to 145 on 8/5 now 113. -continue coreg 3.125 mg BID -continue jardiance 10 mg daily -continue entresto 24-26 mg BID -continue spironolactone 12.5 mg daily    Mild to moderate MR Severe TR - diuresis as above, will continue to monitor   Nonobstructive CAD - heart cath 2017 with nonobstructive disease - hs troponin     CKD  - overall stable, she has had labile creatinine in 2024, has been as high as 2.22   DM with hyperglycemia - A1c 11.0% - per primary  For questions or updates, please contact [facility] Please consult [email or web address] for contact info under Cardiology/STEMI.      Maisie Fus, MD

## 2022-10-02 NOTE — Progress Notes (Signed)
Occupational Therapy Treatment Patient Details Name: [name] MRN: [id number] DOB: [birth year] Today's Date: 10/02/2022   History of present illness 77 y.o. female presents to [facility] hospital on 09/24/2022 with worsening LE edema. Admitted for acute on chronic CHF. PMH includes breast CA, CHF, CAD, DM, NICM.   OT comments  Pt with slow progression towards goals, increased unsteadiness this session needing RW support for short distance ambulation in room with minA. Pt needing min-mod A for standing UB/LB bathing and dressing tasks at sink needing x3 seated rest breaks. Pt supervision for bed mobility with increased time. Pt presenting with impairments listed below, will follow acutely. Recommend HHOT at d/c.       If plan is discharge home, recommend the following:  A little help with walking and/or transfers;A little help with bathing/dressing/bathroom;Assistance with cooking/housework;Assist for transportation;Help with stairs or ramp for entrance   Equipment Recommendations  Tub/shower seat    Recommendations for Other Services PT consult    Precautions / Restrictions Precautions Precautions: Fall Restrictions Weight Bearing Restrictions: No       Mobility Bed Mobility Overal bed mobility: Needs Assistance Bed Mobility: Supine to Sit     Supine to sit: Supervision, HOB elevated     General bed mobility comments: incr time    Transfers Overall transfer level: Needs assistance Equipment used: None Transfers: Sit to/from Stand Sit to Stand: Min assist                 Balance Overall balance assessment: Needs assistance Sitting-balance support: No upper extremity supported, Feet supported Sitting balance-Leahy Scale: Good     Standing balance support: Bilateral upper extremity supported, During functional activity, Reliant on assistive device for balance Standing balance-Leahy Scale: Poor Standing balance comment: RW support                            ADL either performed or assessed with clinical judgement   ADL Overall ADL's : Needs assistance/impaired                 Upper Body Dressing : Minimal assistance;Standing   Lower Body Dressing: Moderate assistance;Sit to/from stand;Sitting/lateral leans   Toilet Transfer: Minimal assistance;Ambulation;Rolling walker (2 wheels);Regular Toilet   Toileting- Clothing Manipulation and Hygiene: Moderate assistance;Sit to/from stand       Functional mobility during ADLs: Minimal assistance;Rolling walker (2 wheels)      Extremity/Trunk Assessment Upper Extremity Assessment Upper Extremity Assessment: Generalized weakness   Lower Extremity Assessment Lower Extremity Assessment: Defer to PT evaluation        Vision   Vision Assessment?: No apparent visual deficits Additional Comments: improved with glasses for functional tasks   Perception Perception Perception: Not tested   Praxis Praxis Praxis: Not tested    Cognition Arousal: Alert Behavior During Therapy: WFL for tasks assessed/performed Overall Cognitive Status: Within Functional Limits for tasks assessed                                 General Comments: delayed responses, likely HOH vs cognition, pt following commands appropriately        Exercises      Shoulder Instructions       General Comments VSS on RA    Pertinent Vitals/ Pain       Pain Assessment Pain Assessment: No/denies pain  Home Living  Prior Functioning/Environment              Frequency  Min 1X/week        Progress Toward Goals  OT Goals(current goals can now be found in the care plan section)  Progress towards OT goals: Progressing toward goals  Acute Rehab OT Goals Patient Stated Goal: none stated OT Goal Formulation: With patient Time For Goal Achievement: 10/10/22 Potential to Achieve Goals: Good ADL Goals Pt Will Perform Upper Body  Dressing: with modified independence;sitting Pt Will Perform Lower Body Dressing: with modified independence;sit to/from stand;sitting/lateral leans Pt Will Transfer to Toilet: with modified independence;ambulating;regular height toilet Pt Will Perform Tub/Shower Transfer: Shower transfer;Tub transfer;with modified independence;ambulating Additional ADL Goal #1: pt will verbalize x3 energy precaution strategies in order to improve activity tolerance for ADLs  Plan      Co-evaluation                 AM-PAC OT "6 Clicks" Daily Activity     Outcome Measure   Help from another person eating meals?: None Help from another person taking care of personal grooming?: A Little Help from another person toileting, which includes using toliet, bedpan, or urinal?: A Little Help from another person bathing (including washing, rinsing, drying)?: A Lot Help from another person to put on and taking off regular upper body clothing?: A Little Help from another person to put on and taking off regular lower body clothing?: A Lot 6 Click Score: 17    End of Session Equipment Utilized During Treatment: Gait belt  OT Visit Diagnosis: Unsteadiness on feet (R26.81);Other abnormalities of gait and mobility (R26.89);Muscle weakness (generalized) (M62.81)   Activity Tolerance Patient tolerated treatment well   Patient Left in chair;with call bell/phone within reach;with chair alarm set   Nurse Communication Mobility status (wound on bottom, chair alarm has no cord to wall)        Time: 8469-6295 OT Time Calculation (min): 31 min  Charges: OT General Charges $OT Visit: 1 Visit OT Treatments $Self Care/Home Management : 23-37 mins  Carver Fila, OTD, OTR/L SecureChat Preferred Acute Rehab [phone]   [name] 10/02/2022, 9:16 AM

## 2022-10-02 NOTE — Telephone Encounter (Addendum)
Contacted [employer] for status update, they are requesting proof of income before a determination can be made. Will discuss with patient after discharge.

## 2022-10-02 NOTE — Progress Notes (Signed)
Heart Failure Stewardship Pharmacist Progress Note   PCP: Darrow Bussing, MD PCP-Cardiologist: Bryan Lemma, MD    HPI:  77 yo F with PMH of HFrEF, T2DM, HTN, HLD, CKD, COPD, and PAD.  Admitted 11/2015 with A/C Systolic HF. Echo at that time with EF 40-45%. Cath during that admission with mild to moderate, but non-obstructive CAD including 15% distal LAD stenosis, 30% ulcerated mid LCx disease, and 40% mid RCA stenosis. Followe by the advanced heart failure team until 11/2016 when EF recovered to 55-60%.   She was admitted 09/2020 with acute on chronic HF in the setting of dietary noncompliance. EF was 50-55%. She was scheduled to follow up with HF TOC and cancelled her appt.   Admitted 02/2022 with AKI, RSV, and acute HF. BNP >4500. EF back down 35-40%. Patient was restarted on Entresto and continued on carvedilol with outpatient consideration of MRA and SGLT2i once recovered form RSV.   Presented to the ED on 8/4 with LE edema, shortness of breath, weight gain cough, N/D, fatigue, and body aches. She states over the last two weeks she has had progression of these symptoms. In the ED trop 1610>9604, BNP >4500. CXR with mild pulmonary vascular congestion. Repeat ECHO with LVEF 30-35%, moderately reduced RV, mild-mod MR, and severe TR.    Current HF Medications: Diuretic: bumetanide 3 mg IV BID Beta Blocker: carvedilol 3.125 mg BID ACE/ARB/ARNI: Entresto 24/26 mg BID MRA: spironolactone 12.5 mg daily SGLT2i: Jardiance 10 mg daily Other: s/p Ferrlecit 250 mg IV daily x 4 doses  Prior to admission HF Medications: Diuretic: furosemide 40 mg BID Beta blocker: carvedilol 3.125 mg BID ACE/ARB/ARNI: Entresto 49/51 mg BID  Pertinent Lab Values: Serum creatinine 1.80, BUN 44, Potassium 3.9, Sodium 135, BNP >4500 > 2961.3, Magnesium 2.3, A1c 10.9 (improved from 13.3 in January)   Vital Signs: Weight: 113 lbs (admission weight: 145 lbs) Blood pressure: 110-120/80s  Heart rate: 50-60s   I/O: net -2.2L yesterday, net -25.1L since admission  Medication Assistance / Insurance Benefits Check: Does the patient have prescription insurance? No  Does the patient qualify for medication assistance through manufacturers or grants?   Yes Eligible grants and/or patient assistance programs: Valla Leaver Medication assistance applications in progress: Jardiance  Medication assistance applications approved: Entresto Approved medication assistance renewals will be completed by: [facility]  Outpatient Pharmacy:  Prior to admission outpatient pharmacy: [facility] Is the patient willing to use [facility] TOC pharmacy at discharge? Yes Is the patient willing to transition their outpatient pharmacy to utilize a [facility] outpatient pharmacy?   No    Assessment: 1. Acute on chronic systolic CHF (LVEF 35-40%). NYHA class III symptoms. - Continue bumetanide 3 mg IV BID, holding dose this afternoon and assessing response. May be able to transition to torsemide soon. Strict I/Os and daily weights. Keep K>4 and Mg>2. - Continue carvedilol 3.125 mg BID  - Consider increasing back to Entresto 49/51 mg BID when off IV bumex - Continue spironolactone 12.5 mg daily  - Continue Jardiance 10 mg daily - patient assistance initiated - S/p Ferrlecit 250 mg IV daily x 4 doses   Plan: 1) Medication changes recommended at this time: - Increase Entresto to 49/51 mg BID when off IV bumex  2) Patient assistance: - Already receives Entresto from [employer] patient assistance - Application for [employer] patient assistance initiated. Patient portion completed and signed.   3)  Education  - Patient has been educated on current HF medications and potential additions to HF medication regimen -  Patient verbalizes understanding that over the next few months, these medication doses may change and more medications may be added to optimize HF regimen - Patient has been educated on basic disease state pathophysiology and  goals of therapy   Sharen Hones, PharmD, BCPS Heart Failure Stewardship Pharmacist Phone [phone]

## 2022-10-03 ENCOUNTER — Other Ambulatory Visit (HOSPITAL_COMMUNITY): Payer: Self-pay

## 2022-10-03 ENCOUNTER — Telehealth: Payer: Self-pay | Admitting: Cardiology

## 2022-10-03 DIAGNOSIS — N1832 Chronic kidney disease, stage 3b: Secondary | ICD-10-CM | POA: Diagnosis not present

## 2022-10-03 DIAGNOSIS — I5023 Acute on chronic systolic (congestive) heart failure: Secondary | ICD-10-CM | POA: Diagnosis not present

## 2022-10-03 DIAGNOSIS — I10 Essential (primary) hypertension: Secondary | ICD-10-CM | POA: Diagnosis not present

## 2022-10-03 DIAGNOSIS — E1169 Type 2 diabetes mellitus with other specified complication: Secondary | ICD-10-CM | POA: Diagnosis not present

## 2022-10-03 LAB — GLUCOSE, CAPILLARY
Glucose-Capillary: 189 mg/dL — ABNORMAL HIGH (ref 70–99)
Glucose-Capillary: 191 mg/dL — ABNORMAL HIGH (ref 70–99)
Glucose-Capillary: 131 mg/dL — ABNORMAL HIGH (ref 70–99)

## 2022-10-03 MED ORDER — EMPAGLIFLOZIN 10 MG PO TABS
10.0000 mg | ORAL_TABLET | Freq: Every day | ORAL | 0 refills | Status: DC
  Filled 2022-10-03: qty 30, 30d supply, fill #0

## 2022-10-03 MED ORDER — SPIRONOLACTONE 25 MG PO TABS
12.5000 mg | ORAL_TABLET | Freq: Every day | ORAL | 0 refills | Status: DC
  Filled 2022-10-03: qty 15, 30d supply, fill #0

## 2022-10-03 MED ORDER — BUMETANIDE 1 MG PO TABS
1.0000 mg | ORAL_TABLET | Freq: Every day | ORAL | Status: DC
  Administered 2022-10-03: 1 mg via ORAL
  Filled 2022-10-03: qty 1

## 2022-10-03 MED ORDER — BUMETANIDE 1 MG PO TABS
1.0000 mg | ORAL_TABLET | Freq: Every day | ORAL | 0 refills | Status: DC
Start: 2022-10-04 — End: 2022-11-02
  Filled 2022-10-03: qty 30, 30d supply, fill #0

## 2022-10-03 MED ORDER — INSULIN NPH ISOPHANE & REGULAR (70-30) 100 UNIT/ML ~~LOC~~ SUSP: 5.0000 [IU] | Freq: Two times a day (BID) | SUBCUTANEOUS | Status: DC

## 2022-10-03 NOTE — Telephone Encounter (Signed)
Left voicemail to return call to office.

## 2022-10-03 NOTE — Inpatient Diabetes Management (Signed)
Inpatient Diabetes Program Recommendations  AACE/ADA: New Consensus Statement on Inpatient Glycemic Control (2015)  Target Ranges:  Prepandial:   less than 140 mg/dL      Peak postprandial:   less than 180 mg/dL (1-2 hours)      Critically ill patients:  140 - 180 mg/dL   Lab Results  Component Value Date   GLUCAP 189 (H) 10/03/2022   HGBA1C 11.0 (H) 09/26/2022    Review of Glycemic Control  Latest Reference Range & Units 10/02/22 07:59 10/02/22 12:04 10/02/22 16:37 10/02/22 21:10 10/03/22 08:03  Glucose-Capillary 70 - 99 mg/dL 578 (H) 469 (H) 629 (H) 258 (H) 189 (H)  (H): Data is abnormally high  Diabetes history: DM2 Outpatient Diabetes medications: 70/30 25 units QPM Current orders for Inpatient glycemic control:  Semglee 5 units, Novolog 0-9 units TID and 0-5 units at bedtime, Jardiance 10 mg QD   Inpatient Diabetes Program Recommendations:     Might consider:   Semglee 8 units every day Novolog 3 units TID with meals   Will continue to follow while inpatient.   Thank you, Dulce Sellar, MSN, CDCES Diabetes Coordinator Inpatient Diabetes Program [phone] (team pager from 8a-5p)

## 2022-10-03 NOTE — Telephone Encounter (Signed)
Pt is still admitted.  

## 2022-10-03 NOTE — Plan of Care (Signed)

## 2022-10-03 NOTE — Progress Notes (Signed)
Rounding Note    Patient Name: [name] Date of Encounter: 10/03/2022  [facility] Cardiologist: Bryan Lemma, MD   Subjective   No significant complaints today.  Denies any shortness of breath.  Feels weak and tired of being in hospital. Inpatient Medications    Scheduled Meds:  acetaZOLAMIDE  500 mg Oral BID   aspirin EC  81 mg Oral Daily   atorvastatin  40 mg Oral Daily   carvedilol  3.125 mg Oral BID WC   empagliflozin  10 mg Oral Daily   heparin  5,000 Units Subcutaneous Q8H   insulin aspart  0-9 Units Subcutaneous TID WC   insulin glargine-yfgn  5 Units Subcutaneous Daily   potassium chloride  40 mEq Oral BID   sacubitril-valsartan  1 tablet Oral BID   spironolactone  12.5 mg Oral Daily   Continuous Infusions:  bumetanide (BUMEX) IV 3 mg (10/02/22 1752)   PRN Meds: acetaminophen **OR** acetaminophen, guaiFENesin-dextromethorphan, melatonin, ondansetron **OR** ondansetron (ZOFRAN) IV   Vital Signs    Vitals:   10/02/22 0823 10/02/22 0857 10/02/22 1928 10/03/22 0349  BP:  113/88 (!) 102/51 (!) 107/49  Pulse: 64 67 (!) 49 (!) 50  Resp: 18  18 17   Temp: 97.7 F (36.5 C)  97.9 F (36.6 C) 97.9 F (36.6 C)  TempSrc: Oral  Oral Oral  SpO2: 98%  99% 98%  Weight:    48.9 kg  Height:        Intake/Output Summary (Last 24 hours) at 10/03/2022 4782 Last data filed at 10/03/2022 0352 Gross per 24 hour  Intake 240 ml  Output 1850 ml  Net -1610 ml      10/03/2022    3:49 AM 10/02/2022    5:00 AM 10/01/2022    3:50 AM  Last 3 Weights  Weight (lbs) 107 lb 12.9 oz 113 lb 12.1 oz 112 lb 6.4 oz  Weight (kg) 48.9 kg 51.6 kg 50.984 kg      Telemetry    Normal sinus rhythm heart rate in the 60s.  Frequent ectopy and PVCs/PACs.- Personally Reviewed  ECG    No new tracings- Personally Reviewed  Physical Exam   GEN: No acute distress.   Neck: No JVD Cardiac: RRR, no murmurs, rubs, or gallops.  Respiratory: slight crackles in the bases, much  improved GI: Soft, nontender, non-distended  MS: No edema Neuro:  Nonfocal  Psych: Normal affect   Labs    High Sensitivity Troponin:   Recent Labs  Lab 09/25/22 0029 09/25/22 0132  TROPONINIHS 1,538* 1,438*     Chemistry Recent Labs  Lab 10/01/22 0028 10/02/22 1057 10/03/22 0045  NA 135 137 139  K 3.9 4.5 4.8  CL 100 105 105  CO2 25 19* 25  GLUCOSE 188* 312* 234*  BUN 44* 43* 43*  CREATININE 1.80* 1.59* 1.73*  CALCIUM 8.6* 8.7* 8.9  MG 2.3 2.6* 2.8*  GFRNONAA 28* 33* 30*  ANIONGAP 10 13 9     Lipids  No results for input(s): "CHOL", "TRIG", "HDL", "LABVLDL", "LDLCALC", "CHOLHDL" in the last 168 hours.   Hematology Recent Labs  Lab 09/27/22 0222  WBC 3.2*  RBC 3.20*  HGB 8.6*  HCT 27.3*  MCV 85.3  MCH 26.9  MCHC 31.5  RDW 17.9*  PLT 207   Thyroid  No results for input(s): "TSH", "FREET4" in the last 168 hours.   BNP Recent Labs  Lab 09/30/22 0110  BNP 2,961.3*    DDimer No results for  input(s): "DDIMER" in the last 168 hours.   Radiology    No results found.  Cardiac Studies   Echocardiogram 02/27/2022  1. Left ventricular ejection fraction, by estimation, is 35 to 40%. The  left ventricle has moderately decreased function. The left ventricle  demonstrates global hypokinesis. The left ventricular internal cavity size  was moderately dilated. Left  ventricular diastolic parameters are consistent with Grade III diastolic  dysfunction (restrictive).   2. Right ventricular systolic function is low normal. The right  ventricular size is moderately enlarged. There is mildly elevated  pulmonary artery systolic pressure. The estimated right ventricular  systolic pressure is 39.1 mmHg.   3. Left atrial size was moderately dilated.   4. Right atrial size was moderately dilated.   5. The mitral valve is degenerative. Mild mitral valve regurgitation. No  evidence of mitral stenosis.   6. The aortic valve is tricuspid. Aortic valve regurgitation is  not  visualized. No aortic stenosis is present.   7. The inferior vena cava is normal in size with <50% respiratory  variability, suggesting right atrial pressure of 8 mmHg.   8. Evidence of atrial level shunting detected by color flow Doppler.  There is a small patent foramen ovale with predominantly left to right  shunting across the atrial septum.   Comparison(s): The left ventricular function is worsened. Prior EF 50-55%.   Patient Profile     77 y.o. female with history of DM type II, hypertension, hyperlipidemia, CKD, COPD, chronic HFrEF with an EF of 35 to 40% in January 2024, bilateral PAD.  Patient admitted for worsening symptoms of peripheral edema, cough, shortness of breath, significant weight gain 25+ pounds.  Patient with history of reduced EF as low as 20% in 2018 thought likely nonischemic due to nonobstructive CAD noted on catheterization in 2018. She has had labile EF over the years increased to 50 to 55% 09/2020, however most recent echo in January 2024 showed EF 35 to 40% with mild RV dysfunction.  Grade 3 diastolic dysfunction.  RVSP 39.  Assessment & Plan    Acute on chronic HFrEF Weight on admission 150.8.  Patient reports baseline weight to being around 125. Echocardiogram during this admission shows mildy reduced EF of 30 to 35% compared to prior study in January.  She has moderately reduced RV function.  Patient has had significant diuresis this admission over 26 L.  Current weight 107 pounds.  Now appears to be euvolemic with overall clear lung signs with slight crackles in the bases.  Patient overall very weak and fatigued from this admission.  Can transition to p.o., bumex 1mg .  Continue carvedilol 3.125 mg twice daily, Jardiance 10 mg Entresto 24-26 mg twice daily, spironolactone 12.5 mg.  Seems to be compensating decently right now.  Blood pressures have been stable. Some concern for poor CO initially. Perfusing well today and warm. Titrate GDMT once  sufficiently diuresed.  Hypokalemia Potassium 4.8  Stopping supplementation, now no longer aggressively diuresing   Nonobstructive CAD Elevated troponins 1538, 1438 History of nonobstructive disease on cardiac catheterization in 2017.   Continue aspirin and atorvastatin 40 mg.  LDL 50.  CKD stage III Creatinine seems stable here,  1.65 on admission.  Currently 1.73  Baseline unclear however seems to range between 1.2-1.8 on ferrilecit.   Normal sinus rhythm with frequent PVCs and ectopy Could be wall tension from hypervolemia causing ectopy.  Ectopy has improved and less frequent after diuresis.   Mild to moderate MR/severe TR Likely  due to volume.  Diuresis as above  For questions or updates, please contact [facility] Please consult [email or web address] for contact info under        Signed, Abagail Kitchens, PA-C  10/03/2022, 8:22 AM

## 2022-10-03 NOTE — TOC Transition Note (Addendum)
Transition of Care [facility]) - CM/SW Discharge Note   Patient Details  Name: [name] MRN: [id number] Date of Birth: [birth year]  Transition of Care [facility] CM/SW Contact:  Gala Lewandowsky, RN Phone Number: 10/03/2022, 3:50 PM   Clinical Narrative: Case Manager spoke with patient regarding disposition needs.  Patient declined home health PT/OT services. Patient provided Case Manager verbal permission to call daughter to make her aware-awaiting call back. MD and Staff RN aware that patient declined services as well. Case Manager will await call back from daughter.   1643 10-03-22 Case Manager was able to speak with daughter [name] and she wants the patient to have [facility] PT/OT in the home. Referral submitted to [facility] for services. No further needs identified at this time.   Final next level of care: Home/Self Care Barriers to Discharge: No Barriers Identified  Discharge Plan and Services Additional resources added to the After Visit Summary for   In-house Referral: NA Discharge Planning Services: CM Consult              DME Agency: NA    Social Determinants of Health (SDOH) Interventions SDOH Screenings   Food Insecurity: Patient Declined (09/25/2022)  Housing: Patient Declined (09/25/2022)  Transportation Needs: No Transportation Needs (09/25/2022)  Utilities: Patient Declined (09/25/2022)  Alcohol Screen: Low Risk  (09/25/2022)  Financial Resource Strain: Low Risk  (09/25/2022)  Tobacco Use: Medium Risk (09/24/2022)   Readmission Risk Interventions     No data to display

## 2022-10-03 NOTE — Progress Notes (Signed)
Heart Failure Stewardship Pharmacist Progress Note   PCP: Darrow Bussing, MD PCP-Cardiologist: Bryan Lemma, MD    HPI:  77 yo F with PMH of HFrEF, T2DM, HTN, HLD, CKD, COPD, and PAD.  Admitted 11/2015 with A/C Systolic HF. Echo at that time with EF 40-45%. Cath during that admission with mild to moderate, but non-obstructive CAD including 15% distal LAD stenosis, 30% ulcerated mid LCx disease, and 40% mid RCA stenosis. Followe by the advanced heart failure team until 11/2016 when EF recovered to 55-60%.   She was admitted 09/2020 with acute on chronic HF in the setting of dietary noncompliance. EF was 50-55%. She was scheduled to follow up with HF TOC and cancelled her appt.   Admitted 02/2022 with AKI, RSV, and acute HF. BNP >4500. EF back down 35-40%. Patient was restarted on Entresto and continued on carvedilol with outpatient consideration of MRA and SGLT2i once recovered form RSV.   Presented to the ED on 8/4 with LE edema, shortness of breath, weight gain cough, N/D, fatigue, and body aches. She states over the last two weeks she has had progression of these symptoms. In the ED trop 2956>2130, BNP >4500. CXR with mild pulmonary vascular congestion. Repeat ECHO with LVEF 30-35%, moderately reduced RV, mild-mod MR, and severe TR.    Current HF Medications: Diuretic: bumetanide 1 mg PO daily Beta Blocker: carvedilol 3.125 mg BID ACE/ARB/ARNI: Entresto 24/26 mg BID MRA: spironolactone 12.5 mg daily SGLT2i: Jardiance 10 mg daily Other: s/p Ferrlecit 250 mg IV daily x 4 doses  Prior to admission HF Medications: Diuretic: furosemide 40 mg BID Beta blocker: carvedilol 3.125 mg BID ACE/ARB/ARNI: Entresto 49/51 mg BID  Pertinent Lab Values: Serum creatinine 1.73, BUN 43, Potassium 4.8, Sodium 139, BNP >4500 > 2961.3, Magnesium 2.8, A1c 10.9 (improved from 13.3 in January)   Vital Signs: Weight: 107 lbs (admission weight: 145 lbs) Blood pressure: 110/60s  Heart rate: 50-60s  I/O:  net -1.8L yesterday, net -26.7L since admission  Medication Assistance / Insurance Benefits Check: Does the patient have prescription insurance? No  Does the patient qualify for medication assistance through manufacturers or grants?   Yes Eligible grants and/or patient assistance programs: Valla Leaver Medication assistance applications in progress: Jardiance  Medication assistance applications approved: Entresto Approved medication assistance renewals will be completed by: [facility]  Outpatient Pharmacy:  Prior to admission [facility] pharmacy: [facility] Is the patient willing to use [facility] TOC pharmacy at discharge? Yes Is the patient willing to transition their outpatient pharmacy to utilize a [facility] outpatient pharmacy?   No    Assessment: 1. Acute on chronic systolic CHF (LVEF 35-40%). NYHA class III symptoms. - Agree with transitioning to bumetanide 1 mg PO daily. Strict I/Os and daily weights. Keep K>4 and Mg>2. K 4.8 - stopped BID KCl.  - Continue carvedilol 3.125 mg BID  - Consider increasing back to Entresto 49/51 mg BID pending BP off IV bumex - Continue spironolactone 12.5 mg daily  - Continue Jardiance 10 mg daily - patient assistance initiated - S/p Ferrlecit 250 mg IV daily x 4 doses   Plan: 1) Medication changes recommended at this time: - Increase Entresto to 49/51 mg BID pending BP after stopping IV bumex  2) Patient assistance: - Already receives Entresto from [employer] patient assistance - Application for [employer] patient assistance initiated. Patient portion completed and signed.   3)  Education  - Patient has been educated on current HF medications and potential additions to HF medication regimen - Patient  verbalizes understanding that over the next few months, these medication doses may change and more medications may be added to optimize HF regimen - Patient has been educated on basic disease state pathophysiology and goals of therapy   Sharen Hones,  PharmD, BCPS Heart Failure Stewardship Pharmacist Phone [phone]

## 2022-10-03 NOTE — Progress Notes (Signed)
Pt had a 6-beat-run of V-tach, resting comfortably with no complaints of CP or shortness of breath. Attempted to notify Howerter, MD; will continue to monitor.  Bari Edward, RN

## 2022-10-03 NOTE — Discharge Summary (Addendum)
Physician Discharge Summary   Patient: [name] MRN: [id number] DOB: 09/22/1945  Admit date:     09/24/2022  Discharge date: 10/03/22  Discharge Physician: York Ram    PCP: Darrow Bussing, MD   Recommendations at discharge:    Patient has been placed on bumetanide 1 mg daily for loop diuretic.  Continue guideline directed medical therapy for heart failure with entreso, carvedilol, spironolactone and added SLGT 2 inh.  Follow up renal function and electrolytes in 7 days as outpatient. Follow up with Dr Docia Chuck in 7 to 10 days. Follow up with Cardiology as scheduled.   I spoke with patient's daughter over the phone we talked in detail about patient's condition, plan of care and prognosis and all questions were addressed.   Discharge Diagnoses: Principal Problem:   Acute on chronic systolic CHF (congestive heart failure) (HCC) Active Problems:   CKD stage 3b, GFR 30-44 ml/min (HCC)   Essential hypertension   Type 2 diabetes mellitus with hyperlipidemia (HCC)   PAD (peripheral artery disease) (HCC)   Iron deficiency anemia  Resolved Problems:   * No resolved hospital problems. [facility] Course: [name] was admitted to the hospital with the working diagnosis of heart failure exacerbation.   76/F with chronic systolic CHF, NICM EF 35%, CKD 3B, type 2 diabetes mellitus, COPD presented to the ED with progressive dyspnea on exertion and worsening lower extremity edema X1 month, despite compliance with diuretics, recently her Lasix dose has been increased to 80 Mg twice daily, she has gained 22 pounds fluid weight over the last few months. In the ED her blood pressure was 126/77, HR 66, RR 25 and 02 saturation 99%, heart with S1 and S2 present and regular, positive JVD, respiratory with rales bilaterally, with no wheezing, abdomen with no distention and positive lower extremity edema.   Na 135, K 3,9 Cl 100, bicarbonate 25, glucose 100, bun 32 cr 1,65  AST 32, ALT 28   BNP >4,500  High sensitive troponin 1,538 and 1,438  Wbc 3,1 hgb 9,3 plt 227  Urine analysis SG 1,010, protein 30, glucose > 500, moderate hgb and trace leukocytes.   Chest radiograph with cardiomegaly with bilateral hilar vascular congestion with no infiltrates or effusions.   EKG 74 bpm, left axis deviation, normal intervals, sinus rhythm with poor R R wave progression, positive PVCs, with no significant ST segment or T wave changes.   Patient was placed on aggressive diuresis with IV bumetanide.  After a prolonged hospitalization her fluid balance has improved.  Plan to follow up with cardiology and primary care as outpatient.   Assessment and Plan: * Acute on chronic systolic CHF (congestive heart failure) (HCC) Echocardiogram with reduced LV systolic function EF 30 to 35%, global hypokinesis, no LVH, RV systolic function with moderate reduction,  RV with moderate dilatation, RVSP 48.8, LA and RA with moderate dilatation, mild to moderate MR, severe TR.   Patient was diuresed with sequential nephron blockade with loop diuretic (bumetanide), acetazolamide, spironolactone and SGLT 2 inh.  Negative fluid balance was achieved, - 26,690 ml, with significant improvement in her symptoms.   Plan to continue medical therapy with entresto, carvedilol, spironolactone, empagliflozin. Loop diuretic with bumetanide.  Follow up as outpatient with primary care and cardiology.   CKD stage 3b, GFR 30-44 ml/min (HCC) At the time of her discharge her renal function  has a serum cr of 1,73 with K at 4,8 and serum bicarbonate of 35. Na 139 Mg 2,8  Diuresis with bumetanide, spironolactone and SGLT 2 inh Follow up renal function in 7 days as outpatient.  Add extra 40 kcl x2 to prevent hypokalemia.   Essential hypertension Continue blood pressure control with entresto and carvedilol.  Diuresis with bumetanide.   Type 2 diabetes mellitus with hyperlipidemia (HCC) Uncontrolled T2Dm with  hyperglycemia/ hypoglycemia.   Insulin dose has been reduced to avoid hypoglycemia. At home she is on 70/30 insulin. Plan to resume home insulin at a lower dose of 5 units bid.  Close follow up as outpatient.   Continue with statin.   PAD (peripheral artery disease) (HCC) Continue blood pressure control. Continue statin and aspirin.   Iron deficiency anemia Serum iron 21, TIBC 375, transferrin saturation 6, ferritin 36. SP IV iron infusion.    Consultants: cardiology  Procedures performed: none   Disposition: Home Diet recommendation:  Discharge Diet Orders (From admission, onward)     Start     Ordered   10/03/22 0000  Diet - low sodium heart healthy        10/03/22 1503           Cardiac and Carb modified diet DISCHARGE MEDICATION: Allergies as of 10/03/2022       Reactions   Pregabalin    Other Reaction(s): feeling fatigue drunk feeling, falls   Tape Rash   Pt can tolerate the paper tape        Medication List     STOP taking these medications    furosemide 40 MG tablet Commonly known as: LASIX       TAKE these medications    atorvastatin 40 MG tablet Commonly known as: LIPITOR Take 1 tablet by mouth once daily   bumetanide 1 MG tablet Commonly known as: BUMEX Take 1 tablet (1 mg total) by mouth daily. Start taking on: October 04, 2022   carvedilol 3.125 MG tablet Commonly known as: COREG Take 1 tablet (3.125 mg total) by mouth 2 (two) times daily with a meal.   empagliflozin 10 MG Tabs tablet Commonly known as: JARDIANCE Take 1 tablet (10 mg total) by mouth daily. Start taking on: October 04, 2022   insulin NPH-regular Human (70-30) 100 UNIT/ML injection Inject 5 Units into the skin 2 (two) times daily with a meal. What changed:  how much to take how to take this when to take this additional instructions   sacubitril-valsartan 49-51 MG Commonly known as: ENTRESTO Take 1 tablet by mouth 2 (two) times daily.   spironolactone 25  MG tablet Commonly known as: ALDACTONE Take 0.5 tablets (12.5 mg total) by mouth daily. Start taking on: October 04, 2022        Follow-up Information     [facility]. Go in 12 day(s).   Specialty: Cardiology Why: Hospital follow up 10/09/2022 @ 3 pm PLEASE bring a current medication list to appointment FREE valet parking, Entrance C, off [employer] information: [address] [place] [address] [phone]        Jodelle Gross, NP Follow up.   Specialties: Cardiology, Radiology, Cardiology Why: Thursday Nov 02, 2022 Appt at 2:20 PM (25 min) Contact information: [address] [place] [address] [phone]         Dorthula Nettles, DO Follow up.   Specialty: Cardiology Why: 10/16/2022 2:20 Contact information: [address] [place] [address] [phone]  Discharge Exam: Filed Weights   10/01/22 0350 10/02/22 0500 10/03/22 0349  Weight: 51 kg 51.6 kg 48.9 kg   BP (!) 94/58 (BP Location: Right Arm)   Pulse (!) 50   Temp 97.6 F (36.4 C) (Oral)   Resp 17   Ht 5\' 3"  (1.6 m)   Wt 48.9 kg   SpO2 100%   BMI 19.10 kg/m   Patient with no chest pain or dyspnea, no edema, PND or orthopnea.   Neurology awake and alert ENT with mild pallor Cardiovascular with S1 and S2 present and regular with no gallops, rubs or murmurs No JVD No lower extremity edema Respiratory with no rales or wheezing, no rhonchi Abdomen with no distention   Condition at discharge: stable  The results of significant diagnostics from this hospitalization (including imaging, microbiology, ancillary and laboratory) are listed below for reference.   Imaging Studies: DG CHEST PORT 1 VIEW  Result Date: 09/26/2022 CLINICAL DATA:  [id number] with shortness of breath, coughing, CHF. EXAM: PORTABLE CHEST 1 VIEW COMPARISON:  Portable chest yesterday at 12:04 a.m.  FINDINGS: 6:02 a.m. The heart is enlarged. Today there is increased central vascular prominence and generalized increased interstitial consolidation consistent with mild interstitial edema. Small pleural effusions have also formed as well as perihilar haziness on the left which could be pneumonitis or asymmetric ground-glass edema. Remainder of the lungs are clear of focal opacities. The mediastinum is stable. The aortic arch is heavily calcified. No new osseous findings. IMPRESSION: 1. Increased central vascular prominence and interstitial consolidation consistent with mild interstitial edema. 2. Small pleural effusions. 3. New left perihilar haziness which could be pneumonitis or asymmetric ground-glass edema. Electronically Signed   By: Almira Bar M.D.   On: 09/26/2022 06:14   ECHOCARDIOGRAM COMPLETE  Result Date: 09/25/2022    ECHOCARDIOGRAM REPORT   Patient Name:   DELILIA TEAFF Date of Exam: 09/25/2022 Medical Rec #:  [id number]    Height:       63.0 in Accession #:    [id number]   Weight:       145.5 lb Date of Birth:  1945-04-08    BSA:          1.689 m Patient Age:    42 years     BP:           128/75 mmHg Patient Gender: F            HR:           71 bpm. Exam Location:  Inpatient Procedure: 2D Echo, Color Doppler and Cardiac Doppler Indications:    acute systolic chf  History:        Patient has prior history of Echocardiogram examinations, most                 recent 02/27/2022.  Sonographer:    Delcie Roch RDCS Referring Phys: [phone] ERIC CHEN IMPRESSIONS  1. Left ventricular ejection fraction, by estimation, is 30 to 35%. The left ventricle has moderately decreased function. The left ventricle demonstrates global hypokinesis. Left ventricular diastolic parameters are indeterminate.  2. Right ventricular systolic function is moderately reduced. The right ventricular size is moderately enlarged. There is moderately elevated pulmonary artery systolic pressure.  3. Left atrial size was moderately  dilated.  4. Right atrial size was moderately dilated.  5. The mitral valve is degenerative. Mild to moderate mitral valve regurgitation.  6. The tricuspid valve is degenerative. Tricuspid valve regurgitation is severe.  7. The aortic  valve is normal in structure. Aortic valve regurgitation is not visualized.  8. The inferior vena cava is dilated in size with <50% respiratory variability, suggesting right atrial pressure of 15 mmHg.  9. Cannot exclude a small PFO. Conclusion(s)/Recommendation(s): LVEF mildly worsened compared to prior echo. FINDINGS  Left Ventricle: Left ventricular ejection fraction, by estimation, is 30 to 35%. The left ventricle has moderately decreased function. The left ventricle demonstrates global hypokinesis. The left ventricular internal cavity size was normal in size. There is no left ventricular hypertrophy. Left ventricular diastolic parameters are indeterminate. Right Ventricle: The right ventricular size is moderately enlarged. Right ventricular systolic function is moderately reduced. There is moderately elevated pulmonary artery systolic pressure. The tricuspid regurgitant velocity is 3.31 m/s, and with an assumed right atrial pressure of 5 mmHg, the estimated right ventricular systolic pressure is 48.8 mmHg. Left Atrium: Left atrial size was moderately dilated. Right Atrium: Right atrial size was moderately dilated. Pericardium: There is no evidence of pericardial effusion. Mitral Valve: The mitral valve is degenerative in appearance. Mild to moderate mitral valve regurgitation. Tricuspid Valve: The tricuspid valve is degenerative in appearance. Tricuspid valve regurgitation is severe. Aortic Valve: The aortic valve is normal in structure. Aortic valve regurgitation is not visualized. Pulmonic Valve: The pulmonic valve was normal in structure. Pulmonic valve regurgitation is not visualized. Aorta: The aortic root and ascending aorta are structurally normal, with no evidence of  dilitation. Venous: The inferior vena cava is dilated in size with less than 50% respiratory variability, suggesting right atrial pressure of 15 mmHg. IAS/Shunts: Cannot exclude a small PFO.  LEFT VENTRICLE PLAX 2D LVIDd:         4.50 cm     Diastology LVIDs:         3.80 cm     LV e' medial:    3.70 cm/s LV PW:         0.80 cm     LV E/e' medial:  28.6 LV IVS:        0.80 cm     LV e' lateral:   6.53 cm/s LVOT diam:     1.60 cm     LV E/e' lateral: 16.2 LV SV:         23 LV SV Index:   13 LVOT Area:     2.01 cm  LV Volumes (MOD) LV vol d, MOD A2C: 86.2 ml LV vol s, MOD A2C: 51.2 ml LV SV MOD A2C:     35.0 ml RIGHT VENTRICLE            IVC RV Basal diam:  2.80 cm    IVC diam: 2.20 cm RV S prime:     7.83 cm/s TAPSE (M-mode): 1.3 cm LEFT ATRIUM             Index        RIGHT ATRIUM           Index LA diam:        4.20 cm 2.49 cm/m   RA Area:     15.90 cm LA Vol (A2C):   70.0 ml 41.44 ml/m  RA Volume:   40.90 ml  24.21 ml/m LA Vol (A4C):   57.2 ml 33.86 ml/m LA Biplane Vol: 63.4 ml 37.53 ml/m  AORTIC VALVE LVOT Vmax:   67.70 cm/s LVOT Vmean:  43.300 cm/s LVOT VTI:    0.113 m  AORTA Ao Root diam: 2.60 cm Ao Asc diam:  3.10 cm MITRAL VALVE  TRICUSPID VALVE MV Area (PHT): 5.84 cm     TR Peak grad:   43.8 mmHg MV Decel Time: 130 msec     TR Vmax:        331.00 cm/s MR Peak grad: 64.6 mmHg MR Vmax:      402.00 cm/s   SHUNTS MV E velocity: 106.00 cm/s  Systemic VTI:  0.11 m MV A velocity: 27.10 cm/s   Systemic Diam: 1.60 cm MV E/A ratio:  3.91 [place] signed by Carolan Clines Signature Date/Time: 09/25/2022/9:38:22 AM    Final    DG Chest Portable 1 View  Result Date: 09/25/2022 CLINICAL DATA:  Cough. EXAM: PORTABLE CHEST 1 VIEW COMPARISON:  None Available. FINDINGS: The cardiac silhouette is enlarged and unchanged in size. Mild perihilar prominence of the pulmonary vasculature is seen. There is marked severity calcification of the aortic arch. Mild, diffuse, chronic appearing  increased interstitial lung markings are seen. Mild atelectasis and/or infiltrate is noted within the left lung base. No pleural effusion or pneumothorax is identified. The visualized skeletal structures are unremarkable. IMPRESSION: 1. Cardiomegaly with mild pulmonary vascular congestion. 2. Mild left basilar atelectasis and/or infiltrate. Electronically Signed   By: Aram Candela M.D.   On: 09/25/2022 00:21    Microbiology: Results for orders placed or performed during the hospital encounter of 09/24/22  Resp panel by RT-PCR (RSV, Flu A&B, Covid) Anterior Nasal Swab     Status: None   Collection Time: 09/25/22 12:03 AM   Specimen: Anterior Nasal Swab  Result Value Ref Range Status   SARS Coronavirus 2 by RT PCR NEGATIVE NEGATIVE Final   Influenza A by PCR NEGATIVE NEGATIVE Final   Influenza B by PCR NEGATIVE NEGATIVE Final    Comment: (NOTE) The Xpert Xpress SARS-CoV-2/FLU/RSV plus assay is intended as an aid in the diagnosis of influenza from Nasopharyngeal swab specimens and should not be used as a sole basis for treatment. Nasal washings and aspirates are unacceptable for Xpert Xpress SARS-CoV-2/FLU/RSV testing.  Fact Sheet for Patients: BloggerCourse.com  Fact Sheet for Healthcare Providers: SeriousBroker.it  This test is not yet approved or cleared by the [place] FDA and has been authorized for detection and/or diagnosis of SARS-CoV-2 by FDA under an Emergency Use Authorization (EUA). This EUA will remain in effect (meaning this test can be used) for the duration of the COVID-19 declaration under Section 564(b)(1) of the Act, 21 U.S.C. section 360bbb-3(b)(1), unless the authorization is terminated or revoked.     Resp Syncytial Virus by PCR NEGATIVE NEGATIVE Final    Comment: (NOTE) Fact Sheet for Patients: BloggerCourse.com  Fact Sheet for Healthcare  Providers: SeriousBroker.it  This test is not yet approved or cleared by the [place] FDA and has been authorized for detection and/or diagnosis of SARS-CoV-2 by FDA under an Emergency Use Authorization (EUA). This EUA will remain in effect (meaning this test can be used) for the duration of the COVID-19 declaration under Section 564(b)(1) of the Act, 21 U.S.C. section 360bbb-3(b)(1), unless the authorization is terminated or revoked.  Performed at [facility], [address], [place], [place] [address]     Labs: CBC: Recent Labs  Lab 09/27/22 0222  WBC 3.2*  HGB 8.6*  HCT 27.3*  MCV 85.3  PLT 207   Basic Metabolic Panel: Recent Labs  Lab 09/29/22 0641 09/30/22 0110 10/01/22 0028 10/02/22 1057 10/03/22 0045  NA 138 135 135 137 139  K 4.1 3.5 3.9 4.5 4.8  CL 102 99  100 105 105  CO2 26 27 25  19* 25  GLUCOSE 123* 254* 188* 312* 234*  BUN 36* 41* 44* 43* 43*  CREATININE 1.55* 1.71* 1.80* 1.59* 1.73*  CALCIUM 8.8* 9.0 8.6* 8.7* 8.9  MG 2.3 2.3 2.3 2.6* 2.8*   Liver Function Tests: No results for input(s): "AST", "ALT", "ALKPHOS", "BILITOT", "PROT", "ALBUMIN" in the last 168 hours. CBG: Recent Labs  Lab 10/02/22 1204 10/02/22 1637 10/02/22 2110 10/03/22 0803 10/03/22 1207  GLUCAP 295* 236* 258* 189* 191*    Discharge time spent: greater than 30 minutes.  Signed: Coralie Keens, MD [facility] 10/03/2022

## 2022-10-03 NOTE — Telephone Encounter (Signed)
   Transition of Care Follow-up Phone Call Request    Patient Name: SOLOMIYA GAIER Date of Birth: [birth year] Date of Encounter: 10/03/2022  Primary Care Provider:  Darrow Bussing, MD Primary Cardiologist:  Bryan Lemma, MD  Brandy Hale has been scheduled for a transition of care follow up appointment with a [facility] provider:  Will see impact clinic, AHF, and follow up APP.   Please reach out to Brandy Hale within 48 hours to confirm appointment and review transition of care protocol questionnaire.  Abagail Kitchens, PA-C  10/03/2022, 11:38 AM

## 2022-10-03 NOTE — Progress Notes (Signed)
Mobility Specialist Progress Note:    10/03/22 1059  Mobility  Activity Transferred from bed to chair  Level of Assistance Minimal assist, patient does 75% or more  Assistive Device Front wheel walker  Activity Response Tolerated well  Mobility Referral Yes  $Mobility charge 1 Mobility  Mobility Specialist Start Time (ACUTE ONLY) 1000  Mobility Specialist Stop Time (ACUTE ONLY) 1010  Mobility Specialist Time Calculation (min) (ACUTE ONLY) 10 min   Pt received in bed, requesting to sit up in her chair. Pt needed MinA w/ bed mobility and for STS. They were able to take a could of steps towards the chair w/o fault. Situated in chair w/ call bell and personal belongings in reach. All needs met and chair alarm on.  Thompson Grayer Mobility Specialist  Please contact vis Secure Chat or  Rehab Office [phone]

## 2022-10-05 NOTE — Telephone Encounter (Signed)
Left voicemail to return

## 2022-10-05 NOTE — Telephone Encounter (Signed)
Call and LM to call  Patient has 2 TWO appointments.  She has NP appt and then Hospital F/U.  Need to cancel one and also do TOC F/U

## 2022-10-05 NOTE — Telephone Encounter (Signed)
Left voicemail to return call to office.

## 2022-10-09 ENCOUNTER — Emergency Department (HOSPITAL_COMMUNITY): Payer: Medicare Other

## 2022-10-09 ENCOUNTER — Ambulatory Visit (HOSPITAL_BASED_OUTPATIENT_CLINIC_OR_DEPARTMENT_OTHER)
Admit: 2022-10-09 | Discharge: 2022-10-09 | Disposition: A | Discharge status: Home / Self Care | Payer: Medicare Other | Source: Ambulatory Visit | Attending: Physician Assistant | Admitting: Physician Assistant

## 2022-10-09 ENCOUNTER — Other Ambulatory Visit: Payer: Self-pay

## 2022-10-09 ENCOUNTER — Other Ambulatory Visit (HOSPITAL_COMMUNITY): Payer: Self-pay

## 2022-10-09 ENCOUNTER — Emergency Department (HOSPITAL_COMMUNITY)
Admission: EM | Admit: 2022-10-09 | Discharge: 2022-10-10 | Discharge status: Against Medical Advice | Payer: Medicare Other | Attending: Emergency Medicine | Admitting: Emergency Medicine

## 2022-10-09 ENCOUNTER — Encounter (HOSPITAL_COMMUNITY): Payer: Self-pay

## 2022-10-09 VITALS — BP 124/64 | HR 61 | Wt 109.0 lb

## 2022-10-09 DIAGNOSIS — R410 Disorientation, unspecified: Secondary | ICD-10-CM | POA: Insufficient documentation

## 2022-10-09 DIAGNOSIS — Z794 Long term (current) use of insulin: Secondary | ICD-10-CM | POA: Diagnosis not present

## 2022-10-09 DIAGNOSIS — I2510 Atherosclerotic heart disease of native coronary artery without angina pectoris: Secondary | ICD-10-CM | POA: Insufficient documentation

## 2022-10-09 DIAGNOSIS — I130 Hypertensive heart and chronic kidney disease with heart failure and stage 1 through stage 4 chronic kidney disease, or unspecified chronic kidney disease: Secondary | ICD-10-CM | POA: Insufficient documentation

## 2022-10-09 DIAGNOSIS — Z1152 Encounter for screening for COVID-19: Secondary | ICD-10-CM | POA: Insufficient documentation

## 2022-10-09 DIAGNOSIS — J449 Chronic obstructive pulmonary disease, unspecified: Secondary | ICD-10-CM | POA: Insufficient documentation

## 2022-10-09 DIAGNOSIS — Z5321 Procedure and treatment not carried out due to patient leaving prior to being seen by health care provider: Secondary | ICD-10-CM | POA: Diagnosis not present

## 2022-10-09 DIAGNOSIS — I428 Other cardiomyopathies: Secondary | ICD-10-CM | POA: Insufficient documentation

## 2022-10-09 DIAGNOSIS — R9431 Abnormal electrocardiogram [ECG] [EKG]: Secondary | ICD-10-CM | POA: Insufficient documentation

## 2022-10-09 DIAGNOSIS — Z7984 Long term (current) use of oral hypoglycemic drugs: Secondary | ICD-10-CM | POA: Diagnosis not present

## 2022-10-09 DIAGNOSIS — I444 Left anterior fascicular block: Secondary | ICD-10-CM | POA: Insufficient documentation

## 2022-10-09 DIAGNOSIS — I5022 Chronic systolic (congestive) heart failure: Secondary | ICD-10-CM | POA: Insufficient documentation

## 2022-10-09 DIAGNOSIS — I5023 Acute on chronic systolic (congestive) heart failure: Secondary | ICD-10-CM | POA: Insufficient documentation

## 2022-10-09 DIAGNOSIS — R4189 Other symptoms and signs involving cognitive functions and awareness: Secondary | ICD-10-CM

## 2022-10-09 DIAGNOSIS — Z79899 Other long term (current) drug therapy: Secondary | ICD-10-CM | POA: Diagnosis not present

## 2022-10-09 DIAGNOSIS — E1122 Type 2 diabetes mellitus with diabetic chronic kidney disease: Secondary | ICD-10-CM | POA: Insufficient documentation

## 2022-10-09 DIAGNOSIS — N1832 Chronic kidney disease, stage 3b: Secondary | ICD-10-CM | POA: Insufficient documentation

## 2022-10-09 DIAGNOSIS — I10 Essential (primary) hypertension: Secondary | ICD-10-CM | POA: Diagnosis not present

## 2022-10-09 DIAGNOSIS — R4182 Altered mental status, unspecified: Secondary | ICD-10-CM | POA: Insufficient documentation

## 2022-10-09 LAB — CBC WITH DIFFERENTIAL/PLATELET
Abs Immature Granulocytes: 0.01 10*3/uL (ref 0.00–0.07)
Basophils Absolute: 0.1 10*3/uL (ref 0.0–0.1)
Basophils Relative: 1 %
Eosinophils Absolute: 0.1 10*3/uL (ref 0.0–0.5)
Eosinophils Relative: 1 %
HCT: 39.6 % (ref 36.0–46.0)
Hemoglobin: 12.6 g/dL (ref 12.0–15.0)
Immature Granulocytes: 0 %
Lymphocytes Relative: 37 %
Lymphs Abs: 1.6 10*3/uL (ref 0.7–4.0)
MCH: 29 pg (ref 26.0–34.0)
MCHC: 31.8 g/dL (ref 30.0–36.0)
MCV: 91 fL (ref 80.0–100.0)
Monocytes Absolute: 0.4 10*3/uL (ref 0.1–1.0)
Monocytes Relative: 8 %
Neutro Abs: 2.3 10*3/uL (ref 1.7–7.7)
Neutrophils Relative %: 53 %
Platelets: 282 10*3/uL (ref 150–400)
RBC: 4.35 MIL/uL (ref 3.87–5.11)
RDW: 20.5 % — ABNORMAL HIGH (ref 11.5–15.5)
WBC: 4.4 10*3/uL (ref 4.0–10.5)
nRBC: 0 % (ref 0.0–0.2)

## 2022-10-09 LAB — RESP PANEL BY RT-PCR (RSV, FLU A&B, COVID)  RVPGX2
Influenza A by PCR: NEGATIVE
Influenza B by PCR: NEGATIVE
Resp Syncytial Virus by PCR: NEGATIVE
SARS Coronavirus 2 by RT PCR: NEGATIVE

## 2022-10-09 LAB — CBC
HCT: 37.3 % (ref 36.0–46.0)
Hemoglobin: 11.6 g/dL — ABNORMAL LOW (ref 12.0–15.0)
MCH: 27.6 pg (ref 26.0–34.0)
MCHC: 31.1 g/dL (ref 30.0–36.0)
MCV: 88.6 fL (ref 80.0–100.0)
Platelets: 255 10*3/uL (ref 150–400)
RBC: 4.21 MIL/uL (ref 3.87–5.11)
RDW: 20.3 % — ABNORMAL HIGH (ref 11.5–15.5)
WBC: 4.5 10*3/uL (ref 4.0–10.5)
nRBC: 0 % (ref 0.0–0.2)

## 2022-10-09 LAB — COMPREHENSIVE METABOLIC PANEL
ALT: 33 U/L (ref 0–44)
ALT: 33 U/L (ref 0–44)
AST: 28 U/L (ref 15–41)
AST: 36 U/L (ref 15–41)
Albumin: 3.3 g/dL — ABNORMAL LOW (ref 3.5–5.0)
Albumin: 3.5 g/dL (ref 3.5–5.0)
Alkaline Phosphatase: 67 U/L (ref 38–126)
Alkaline Phosphatase: 72 U/L (ref 38–126)
Anion gap: 11 (ref 5–15)
Anion gap: 12 (ref 5–15)
BUN: 54 mg/dL — ABNORMAL HIGH (ref 8–23)
BUN: 56 mg/dL — ABNORMAL HIGH (ref 8–23)
CO2: 21 mmol/L — ABNORMAL LOW (ref 22–32)
CO2: 22 mmol/L (ref 22–32)
Calcium: 8.7 mg/dL — ABNORMAL LOW (ref 8.9–10.3)
Calcium: 9 mg/dL (ref 8.9–10.3)
Chloride: 103 mmol/L (ref 98–111)
Chloride: 103 mmol/L (ref 98–111)
Creatinine, Ser: 1.78 mg/dL — ABNORMAL HIGH (ref 0.44–1.00)
Creatinine, Ser: 1.79 mg/dL — ABNORMAL HIGH (ref 0.44–1.00)
GFR, Estimated: 29 mL/min — ABNORMAL LOW (ref >60)
GFR, Estimated: 29 mL/min — ABNORMAL LOW (ref >60)
Glucose, Bld: 122 mg/dL — ABNORMAL HIGH (ref 70–99)
Glucose, Bld: 122 mg/dL — ABNORMAL HIGH (ref 70–99)
Potassium: 4.8 mmol/L (ref 3.5–5.1)
Potassium: 4.9 mmol/L (ref 3.5–5.1)
Sodium: 135 mmol/L (ref 135–145)
Sodium: 137 mmol/L (ref 135–145)
Total Bilirubin: 0.6 mg/dL (ref 0.3–1.2)
Total Bilirubin: 0.8 mg/dL (ref 0.3–1.2)
Total Protein: 6.3 g/dL — ABNORMAL LOW (ref 6.5–8.1)
Total Protein: 6.9 g/dL (ref 6.5–8.1)

## 2022-10-09 LAB — TROPONIN I (HIGH SENSITIVITY): Troponin I (High Sensitivity): 47 ng/L — ABNORMAL HIGH (ref <18)

## 2022-10-09 LAB — BRAIN NATRIURETIC PEPTIDE: B Natriuretic Peptide: 527.8 pg/mL — ABNORMAL HIGH (ref 0.0–100.0)

## 2022-10-09 LAB — CBG MONITORING, ED: Glucose-Capillary: 163 mg/dL — ABNORMAL HIGH (ref 70–99)

## 2022-10-09 NOTE — ED Triage Notes (Signed)
Pt came from heart failure clinic and had a blank stare for 1-2 mins and then came back to. The rapid response nurse that brought pt stated pt was A&O when she got to her.

## 2022-10-09 NOTE — Progress Notes (Addendum)
HEART & VASCULAR TRANSITION OF CARE CONSULT NOTE     Referring Physician: Dr. Ella Jubilee Primary Care: Dr. Docia Chuck Primary Cardiologist: Dr. Herbie Baltimore HF Cardiologist: Previously Dr. Gala Romney (graduated 2018)  HPI: Referred to clinic by Dr. Ella Jubilee with [facility] for heart failure consultation. 77 y.o. female with history of HFrEF/NICM, uncontrolled DM II, HTN, HLD, CKD, COPD, PAD, remote history of breast cancer s/p lumpectomy/radiation/chemo (tamoxifen), tobacco use, remote DVT.  Admitted 11/2015 with A/C Systolic HF. Echo at that time with EF 40-45%. Cath during that admission with mild to moderate, but non-obstructive CAD including 15% distal LAD stenosis, 30% ulcerated mid LCx disease, and 40% mid RCA stenosis.    Admitted 05/05/16 through 05/08/16 with A/C Systolic Heart Failure. ECHO showed EF was down to 20%. RHC completed with well compensated filling pressures. Concern that PVCs or possible Takostubo may be etiology for low EF.  EF improved to 55-60% in 10/2016.   Admitted with a/c CHF in 01/24 and EF down to 35-40%. Decline in EF thought to be d/t RSV infection.  Admitted earlier this month with acute on chronic CHF after failing escalating outpatient diuretics.  Had been drinking 3-4 pots of coffee every day. BNP > 4,500, HS troponin [id number].  Frequent PVCs noted on telemetry. Echo with EF 30-35%, moderately reduced RV, PASP 48 mmHg, severe TR, dilated IVC. Diuresed 26L, > 40 lb.   She is here today for follow-up. Accompanied by her son who has been living with her and helping with medications. Has cut back sodium and fluid intake significantly. Her son states she was previously independent in ADLs and lived alone. Following the admission, she's required much more assistance. Initially was unable to ambulate but is gradually regaining strength. Has PT coming to the house. Her son states she has intermittently been confused. Has been oriented to self and place but not time. During today's  visit she had episode of decreased responsiveness. Unable to formulate responses to questions, appeared to stare off into space. No other focal deficits. Episode lasted no more than a few minutes then appeared confused.      Past Medical History:  Diagnosis Date   Breast cancer (HCC)    breast - left    Chronic diastolic CHF (congestive heart failure), NYHA class 2 (HCC)    a. cMRI 5/10: EF 38% // b. Echo 3/13/: EF 50-55, Gr 1 DD // c. Echo 8/14: EF 40, inf/inf-septal HK, Gr 1 DD, mild MR // d. Echo 5/17: EF 40-45, inf HK, Gr 1 DD, mild MR, mild LAE, reduced RVSF, mild TR, PASP 33 // e. Echo 10/17: EF 40-45, Gr 2 DD, mild MR, severe LAE; => 09/2020 EF 50-55%, GrII DD.   Coronary artery disease, non-occlusive    a. nonobs by LHC in 2010 // b. Myoview 10/17: Lg infarct apex, distal ant and lat walls, no ischemia, EF 38; int risk  // c. LHC 10/17:  dLAD 15, pLCx 30, mRCA 40, LVEDP 15   Diabetes mellitus    NICM (nonischemic cardiomyopathy) (HCC)     Current Outpatient Medications  Medication Sig Dispense Refill   atorvastatin (LIPITOR) 40 MG tablet Take 1 tablet by mouth once daily 90 tablet 3   bumetanide (BUMEX) 1 MG tablet Take 1 tablet (1 mg total) by mouth daily. 30 tablet 0   carvedilol (COREG) 3.125 MG tablet Take 1 tablet (3.125 mg total) by mouth 2 (two) times daily with a meal. 180 tablet 1   empagliflozin (JARDIANCE) 10 MG  TABS tablet Take 1 tablet (10 mg total) by mouth daily. 30 tablet 0   insulin NPH-regular Human (70-30) 100 UNIT/ML injection Inject 5 Units into the skin 2 (two) times daily with a meal.     sacubitril-valsartan (ENTRESTO) 49-51 MG Take 1 tablet by mouth 2 (two) times daily. 28 tablet 0   spironolactone (ALDACTONE) 25 MG tablet Take 0.5 tablets (12.5 mg total) by mouth daily. 15 tablet 0   No current facility-administered medications for this encounter.    Allergies  Allergen Reactions   Pregabalin     Other Reaction(s): feeling fatigue drunk feeling, falls    Tape Rash    Pt can tolerate the paper tape      Social History   Socioeconomic History   Marital status: Widowed    Spouse name: Not on file   Number of children: 2   Years of education: Not on file   Highest education level: Not on file  Occupational History   Occupation: retired  Tobacco Use   Smoking status: Former    Current packs/day: 0.00    Types: Cigarettes    Start date: 09/30/1982    Quit date: 09/29/2012    Years since quitting: 10.0   Smokeless tobacco: Never  Vaping Use   Vaping status: Never Used  Substance and Sexual Activity   Alcohol use: No   Drug use: No   Sexual activity: Not on file  Other Topics Concern   Not on file  Social History Narrative   Not on file   Social Determinants of Health   Financial Resource Strain: Low Risk  (09/25/2022)   Overall Financial Resource Strain (CARDIA)    Difficulty of Paying Living Expenses: Not very hard  Food Insecurity: Patient Declined (09/25/2022)   Hunger Vital Sign    Worried About Running Out of Food in the Last Year: Patient declined    Ran Out of Food in the Last Year: Patient declined  Transportation Needs: No Transportation Needs (09/25/2022)   PRAPARE - Administrator, Civil Service (Medical): No    Lack of Transportation (Non-Medical): No  Physical Activity: Not on file  Stress: Not on file  Social Connections: Not on file  Intimate Partner Violence: Not At Risk (09/25/2022)   Humiliation, Afraid, Rape, and Kick questionnaire    Fear of Current or Ex-Partner: No    Emotionally Abused: No    Physically Abused: No    Sexually Abused: No      Family History  Problem Relation Age of Onset   Stroke Father    Diabetes Maternal Grandmother     Vitals:   10/09/22 1440  BP: 124/64  Pulse: 61  SpO2: 98%  Weight: 49.4 kg (109 lb)    PHYSICAL EXAM: General:  Fatigued appearing elderly female HEENT: normal Neck: supple. no JVD. Carotids 2+ bilat; no bruits.  Cor: PMI nondisplaced.  Regular rate & rhythm. No rubs, gallops, + 3/6 TR murmur Lungs: clear Abdomen: soft, nontender, nondistended.  Extremities: no cyanosis, clubbing, rash, edema Neuro:   ECG: SR 60 bpm   ASSESSMENT & PLAN: Episode of decreased responsiveness/intermittent confusion -Intermittent confusion since recent hospitalization.  -Episode of decreased responsiveness noted ruing today's visit. Could not formulate responses to questions. Appeared to stare off. Resolved after a few minutes but subsequently confused. No other focal deficits. -Rapid called. She was transported to ED for further evaluation -Blood glucose 131 in clinic. BP stable, 124/64. NSR on ECG. -? Seizure vs TIA  vs Hepatic encephalopathy in setting of RV dysfunction/severe TR. CMET, BMET and BNP obtained in clinic. Consider checking ammonia level.  2. Chronic systolic CHF: NICM.  -CMRI 2010  LVEF 38%, very small mid-wall LGE in inferolateral base nonspecific but can be seen in infiltrative disease such as sarcoid -Left heart cath 10/17 with mild to moderate CAD, 15% distal LAD and 30% ulcerated mid Lcx -EF down from 40% in 2017 to 20% in 2018.  -Echo 04/2016 LVEF 20%, Grade 2 DD, Mild MR, Mild LAE, RV mildly reduced, PA peak pressure 21 mm Hg.  -Echo 10/24/2016 EF 55-60%. -Echo 01/24: EF 35-40%, grade III DD, RV low normal -Echo 09/25/22: EF 30-35%, RV moderately reduced, PASP 48 mmHg, moderate BAE, severe TR -Etiology previously thought to be possibly PVC mediated. Frequent PVCs noted during recent admission. Plan 14 day monitor after hospitalization to check burden. Low voltage on ECG and hx orthostasis and paresthesias in hands and feet on chart review. Need to rule out cardiac amyloid once more stable. -NYHA III, confounded by physical deconditioning. Volume okay on exam. On 1 mg bumex daily - Continue Entresto to 49/51 mg BID. Marland Kitchen  - Continue coreg 6.25 mg BID.   - Continue 12.5 mg spiro.   - Labs today  3. CAD -Mild to moderate  CAD on cath in 2017 -On atorvastatin 40  4. HTN - BP stable. Meds as above.  5. DMII:  -Recent A1c 11 -On insulin  6. Chronic kidney disease IIIb -Scr recently 1.5-1.8 -Labs today    Referred to HFSW (PCP, Medications, Transportation, ETOH Abuse, Drug Abuse, Insurance, Financial ):No Refer to Pharmacy:  No Refer to Home Health: No Refer to Advanced Heart Failure Clinic: No, has appointment scheduled to reestablish in clinic Refer to General Cardiology: No, already established  Follow up  Keep appointment with Dr. Gasper Lloyd 08/26 to reestablish pending ED visit

## 2022-10-09 NOTE — ED Provider Triage Note (Signed)
Emergency Medicine Provider Triage Evaluation Note  [name] , a 77 y.o. female  was evaluated in triage.  Patient presents with her son at bedside who provides the history.  He states patient had an episode where she "drifted off" about 15 minutes prior to arrival.  She was staring off into space during this for about a minute.  Denies any frank loss of consciousness.  Denies hitting her head.  She was at her cardiology appointment.  She denies any chest pain or shortness of breath.  Son states her mental status is different as she is normally laughing and talking normally.  Denies any slurred speech, facial droop.  Denies any history of seizures.   Review of Systems  Positive: As above Negative: As above  Physical Exam  BP 101/63 (BP Location: Right Arm)   Pulse (!) 59   Temp 97.7 F (36.5 C)   Resp 17   Ht 5\' 3"  (1.6 m)   Wt 49.4 kg   SpO2 100%   BMI 19.29 kg/m  Gen:   Awake, no distress   Resp:  Normal effort  MSK:   Moves extremities without difficulty  Other:  Cranial nerves III through XII intact, no slurred speech, no drift in the upper and lower extremities Alert and oriented x 3  Medical Decision Making  Medically screening exam initiated at 4:15 PM.  Appropriate orders placed.  [name] was informed that the remainder of the evaluation will be completed by another provider, this initial triage assessment does not replace that evaluation, and the importance of remaining in the ED until their evaluation is complete.     Arabella Merles, PA-C 10/09/22 1627

## 2022-10-09 NOTE — Progress Notes (Signed)
Called to heart failure clinic for episode of "blank stare" and unresponsiveness that lasted 1-2 mins. On my arrival, [name]&O, answered questions appropriately with no weakness noted on my exam. Pt son states that she had episodes like this a few weeks ago when she was in the hospital. Pt brought to triage and report given to [facility] RN.

## 2022-10-09 NOTE — Telephone Encounter (Signed)
Spoke with patients daughter. Unable to confirm at this time if the pt has Part D coverage. If so, this patient may be eligible for a grant. If not, the daughter will provide me with a copy of the SSI letter when able so that I can send the income information to [facility].

## 2022-10-09 NOTE — ED Notes (Signed)
Pt is leaving due to her wetting herself as well as the wait time

## 2022-10-13 ENCOUNTER — Encounter: Payer: Self-pay | Admitting: Cardiovascular Disease

## 2022-10-13 ENCOUNTER — Ambulatory Visit: Payer: Medicare Other | Admitting: Cardiovascular Disease

## 2022-10-13 VITALS — BP 102/46 | HR 59 | Ht 63.0 in | Wt 115.6 lb

## 2022-10-13 DIAGNOSIS — I739 Peripheral vascular disease, unspecified: Secondary | ICD-10-CM

## 2022-10-13 NOTE — Progress Notes (Signed)
My seeing [name]     10/13/2022 [name]   [birth year]  [id number]  Primary Physician Ashley Bussing, MD Primary Cardiologist: Ashley Gess MD [name], [place]  HPI:  [name] is a 77 y.o. thin and frail appearing widowed African-American female mother of 2 sons, 1 daughter and grandmother of 3 grandchildren who was referred to me by Ashley Reining, FNP for evaluation of PAD.  She is retired from being a Public relations account executive at [employer].  Her cardiovascular risk factor profile is notable for recently discontinue tobacco abuse having smoked 1/2 pack a day for the last 60 years.  She has treated diabetes and hyperlipidemia but apparently had a myocardial infarction.  She has never had heart attack or stroke.  She does have CKD with creatinines that run in the high 1 range as well as moderately severe LV dysfunction thought to be nonischemic.  She was recently admitted with volume overload and diuresed.  She lives with her eldest son and is minimally ambulatory.  She had Doppler studies performed 08/04/2022 revealing a right ABI of 1.21 and a left of 0.88 with a high-frequency signal in her mid left SFA.  She really denies claudication and has no evidence of critical ischemia.   Current Meds  Medication Sig   atorvastatin (LIPITOR) 40 MG tablet Take 1 tablet by mouth once daily   bumetanide (BUMEX) 1 MG tablet Take 1 tablet (1 mg total) by mouth daily.   carvedilol (COREG) 3.125 MG tablet Take 1 tablet (3.125 mg total) by mouth 2 (two) times daily with a meal.   insulin NPH-regular Human (70-30) 100 UNIT/ML injection Inject 5 Units into the skin 2 (two) times daily with a meal.   sacubitril-valsartan (ENTRESTO) 49-51 MG Take 1 tablet by mouth 2 (two) times daily.   spironolactone (ALDACTONE) 25 MG tablet Take 0.5 tablets (12.5 mg total) by mouth daily.     Allergies  Allergen Reactions   Pregabalin     Other Reaction(s): feeling fatigue drunk feeling, falls   Tape  Rash    Pt can tolerate the paper tape    Social History   Socioeconomic History   Marital status: Widowed    Spouse name: Not on file   Number of children: 2   Years of education: Not on file   Highest education level: Not on file  Occupational History   Occupation: retired  Tobacco Use   Smoking status: Former    Current packs/day: 0.00    Types: Cigarettes    Start date: 09/30/1982    Quit date: 09/29/2012    Years since quitting: 10.0   Smokeless tobacco: Never  Vaping Use   Vaping status: Never Used  Substance and Sexual Activity   Alcohol use: No   Drug use: No   Sexual activity: Not on file  Other Topics Concern   Not on file  Social History Narrative   Not on file   Social Determinants of Health   Financial Resource Strain: Low Risk  (09/25/2022)   Overall Financial Resource Strain (CARDIA)    Difficulty of Paying Living Expenses: Not very hard  Food Insecurity: Patient Declined (09/25/2022)   Hunger Vital Sign    Worried About Running Out of Food in the Last Year: Patient declined    Ran Out of Food in the Last Year: Patient declined  Transportation Needs: No Transportation Needs (09/25/2022)   PRAPARE - Administrator, Civil Service (Medical): No  Lack of Transportation (Non-Medical): No  Physical Activity: Not on file  Stress: Not on file  Social Connections: Not on file  Intimate Partner Violence: Not At Risk (09/25/2022)   Humiliation, Afraid, Rape, and Kick questionnaire    Fear of Current or Ex-Partner: No    Emotionally Abused: No    Physically Abused: No    Sexually Abused: No     Review of Systems: General: negative for chills, fever, night sweats or weight changes.  Cardiovascular: negative for chest pain, dyspnea on exertion, edema, orthopnea, palpitations, paroxysmal nocturnal dyspnea or shortness of breath Dermatological: negative for rash Respiratory: negative for cough or wheezing Urologic: negative for hematuria Abdominal:  negative for nausea, vomiting, diarrhea, bright red blood per rectum, melena, or hematemesis Neurologic: negative for visual changes, syncope, or dizziness All other systems reviewed and are otherwise negative except as noted above.    Blood pressure (!) 102/46, pulse (!) 59, height 5\' 3"  (1.6 m), weight 115 lb 9.6 oz (52.4 kg), SpO2 98%.  General appearance: alert and no distress Neck: no adenopathy, no carotid bruit, no JVD, supple, symmetrical, trachea midline, and thyroid not enlarged, symmetric, no tenderness/mass/nodules Lungs: clear to auscultation bilaterally Heart: Regular rate and rhythm without murmurs gallops rubs or clicks Extremities: extremities normal, atraumatic, no cyanosis or edema Pulses: Diminished pedal pulses Skin: Skin color, texture, turgor normal. No rashes or lesions Neurologic: Grossly normal  EKG not performed today      ASSESSMENT AND PLAN:   PAD (peripheral artery disease) (HCC) [name] was referred to me by Ashley Reining, FNP because of PAD.  She has a history of nonobstructive CAD, LV dysfunction, diabetes and hyperlipidemia.  She is minimally ambulatory and has no evidence of critical limb ischemia.  She had Doppler studies performed 08/04/2022 that revealed a right ABI 1.21 and a left of 0.88 with a high-frequency signal in her mid left SFA.  She really denies symptoms of claudication.  She does have moderate renal insufficiency with serum creatinine in the 1.8 range.  At this point, she has no indication for an invasive evaluation.     Ashley Gess MD FACP,FACC,FAHA, [facility] 10/13/2022 12:35 PM

## 2022-10-13 NOTE — Patient Instructions (Signed)
Medication Instructions:  NO CHANGES  *If you need a refill on your cardiac medications before your next appointment, please call your pharmacy*   Follow-Up: At [facility], you and your health needs are our priority.  As part of our continuing mission to provide you with exceptional heart care, we have created designated Provider Care Teams.  These Care Teams include your primary Cardiologist (physician) and Advanced Practice Providers (APPs -  Physician Assistants and Nurse Practitioners) who all work together to provide you with the care you need, when you need it.  We recommend signing up for the patient portal called "MyChart".  Sign up information is provided on this After Visit Summary.  MyChart is used to connect with patients for Virtual Visits (Telemedicine).  Patients are able to view lab/test results, encounter notes, upcoming appointments, etc.  Non-urgent messages can be sent to your provider as well.   To learn more about what you can do with MyChart, go to [email or web address].    Your next appointment:     AS NEEDED with Dr. Gwenlyn Found

## 2022-10-13 NOTE — Assessment & Plan Note (Signed)
[name] was referred to me by Joni Reining, FNP because of PAD.  She has a history of nonobstructive CAD, LV dysfunction, diabetes and hyperlipidemia.  She is minimally ambulatory and has no evidence of critical limb ischemia.  She had Doppler studies performed 08/04/2022 that revealed a right ABI 1.21 and a left of 0.88 with a high-frequency signal in her mid left SFA.  She really denies symptoms of claudication.  She does have moderate renal insufficiency with serum creatinine in the 1.8 range.  At this point, she has no indication for an invasive evaluation.

## 2022-10-16 ENCOUNTER — Encounter (HOSPITAL_COMMUNITY): Payer: Medicare Other | Admitting: Cardiology

## 2022-10-17 ENCOUNTER — Telehealth: Payer: Self-pay | Admitting: Cardiovascular Disease

## 2022-10-17 NOTE — Telephone Encounter (Signed)
Patient BP low today at therapy.  HR today was 54, she usually runs apprrox. 59 or higher  (Their parameters are 56 for HR )Asymptomatic. States she did have diarrhea yesterday and not sure about hydration.  Wt 117 today but had quite a bit of sodium intake yesterday after feeling better. She ate can soup and snacks   She is asking if any parameters to hold medication and parameters on when to hold

## 2022-10-17 NOTE — Telephone Encounter (Signed)
[name] from [place] is requesting call back to discuss medication prescriptions prescribed and BP parameters.   BP: 98/48 - Sitting 82/48 - Standing  HR 55

## 2022-10-18 MED ORDER — ENTRESTO 24-26 MG PO TABS: 1.0000 | ORAL_TABLET | Freq: Two times a day (BID) | ORAL | 0 refills | Status: DC

## 2022-10-18 NOTE — Telephone Encounter (Signed)
While her pressures at this level, lets cut the Entresto dose in half.  If systolic pressure is less than 100 would not take spironolactone and Entresto.  Reassess in 2 hours, if pressures still less than 100 then hold for that dose interval.  Bryan Lemma, MD

## 2022-10-18 NOTE — Telephone Encounter (Signed)
Patient changed to [facility] 24-26.  She gets medication through [facility].  Will she need a new PA for the reduced dose or can it be sent in now?? Patient will get sample until new med is received   Call to patient and pick up and hangs up. Call back and speak with [name] (daughter). Recommendations given.  Sample will be given for the new dose of Entresto. She will pick up at the office.  Per Dr Herbie Baltimore  While her pressures at this level, lets cut the Entresto dose in half.  If systolic pressure is less than 100 would not take spironolactone and Entresto.  Reassess in 2 hours, if pressures still less than 100 then hold for that dose interval.   Bryan Lemma, MD

## 2022-10-18 NOTE — Telephone Encounter (Signed)
Called and spoke to daughter to let her know that new RX was sent.  She has already picked up the samples.  She will call if new RX does not arrive before samples are completed

## 2022-10-18 NOTE — Telephone Encounter (Signed)
Will need new Rx sent to [employer] [facility] patient assistance foundation. Completed and faxed.

## 2022-10-24 ENCOUNTER — Telehealth: Payer: Self-pay | Admitting: Cardiovascular Disease

## 2022-10-24 NOTE — Telephone Encounter (Signed)
Spoke with Angelique Blonder and she states patient weight today was 122. She states the patient scale was broken so she is not sure if it was working properly. She would like for you give her new parameters on when you wanted to be notified regarding her weight and what is your weight goal for her.

## 2022-10-24 NOTE — Telephone Encounter (Signed)
Denise from [facility] called to discuss weight parameters. She states starting weight she has for patient was 107, patient currently weighing 122, she got a new scale, the old one was broken.  Patient has no edema and lungs are clear.  She also needs to confirm the new sacubitril-valsartan (ENTRESTO) 24-26 MG, and blood pressure parameters.

## 2022-10-25 NOTE — Telephone Encounter (Signed)
Spoke with Angelique Blonder and she is aware of Dr. Hazle Coca recommendations. She will contact patient daughter as well.  I spoke with patient and she is aware of appointment for 9/12 at 2:20 pm

## 2022-10-25 NOTE — Telephone Encounter (Signed)
Spoke to pts daughter for update. Due to soft bp, [place] Pepper has been discontinued for now. I will be available for assistance in the future if needs change.

## 2022-10-26 ENCOUNTER — Other Ambulatory Visit (HOSPITAL_COMMUNITY): Payer: Self-pay

## 2022-10-26 ENCOUNTER — Other Ambulatory Visit: Payer: Self-pay | Admitting: Cardiovascular Disease

## 2022-10-26 NOTE — Telephone Encounter (Signed)
Spoke with [employer].  [facility] representative states receiving the RX but not "the order".  She states the RX is there to be available for her next refill but still needs an order to send to the patient.  States patient can call to have the order placed.  She states I as the nurse could also request the order placed.  I did request.  The arrival is set for Tuesday the 10 th but patient is due to be out on that date so she, Tiara, spoke with pharmacy to expedite the order for next day delivery. Chastity from pharmacy and CMM with [employer] comes on the line .  She will set the delivery to ship today with receipt by tomorrow. For future refills patient is to call the 800 number and Option 1  7-10 business days ahead for refills. Spoke with [name] and advised of information.  She will be looking for medication and she will call them 7-10 days prior to end of this refill.  She thanks [place] for the help

## 2022-10-26 NOTE — Telephone Encounter (Signed)
She will need to call [employer] pt assistance for a status update. The prescription was sent a week ago so it ideally should be there soon. Their phone # is [phone].

## 2022-10-26 NOTE — Telephone Encounter (Signed)
Patient has enough Entresto to last through Tuesday.  Her samples will be out at that time. Is there a way to see where the RX is in transit? Or will she need samples?

## 2022-11-01 ENCOUNTER — Other Ambulatory Visit (HOSPITAL_COMMUNITY): Payer: Self-pay

## 2022-11-01 NOTE — Progress Notes (Unsigned)
Cardiology Clinic Note   Patient Name: [name] Date of Encounter: 11/02/2022  Primary Care Provider:  Darrow Bussing, MD Primary Cardiologist:  Bryan Lemma, MD  Patient Profile    77 year old female with history of PAD, tobacco abuse, diabetes, hyperlipidemia, CAD, chronic kidney disease.  Recent admission for acute HFrEF.  She had abnormal ABIs with 1.21 on the left and 0.88 in her mid left SFA.  She was referred to Dr. Allyson Sabal who did not feel as if she needed to have invasive evaluation of the left SFA.  The patient was found to have some hypotension and therefore Entresto was cut in half after a phone call from skilled nursing facility, by 8 with blood pressure reading of 98/48 sitting and 82/48 standing with a heart rate of 55 bpm.  Past Medical History    Past Medical History:  Diagnosis Date   Breast cancer (HCC)    breast - left    Chronic diastolic CHF (congestive heart failure), NYHA class 2 (HCC)    a. cMRI 5/10: EF 38% // b. Echo 3/13/: EF 50-55, Gr 1 DD // c. Echo 8/14: EF 40, inf/inf-septal HK, Gr 1 DD, mild MR // d. Echo 5/17: EF 40-45, inf HK, Gr 1 DD, mild MR, mild LAE, reduced RVSF, mild TR, PASP 33 // e. Echo 10/17: EF 40-45, Gr 2 DD, mild MR, severe LAE; => 09/2020 EF 50-55%, GrII DD.   Coronary artery disease, non-occlusive    a. nonobs by LHC in 2010 // b. Myoview 10/17: Lg infarct apex, distal ant and lat walls, no ischemia, EF 38; int risk  // c. LHC 10/17:  dLAD 15, pLCx 30, mRCA 40, LVEDP 15   Diabetes mellitus    NICM (nonischemic cardiomyopathy) (HCC)    Past Surgical History:  Procedure Laterality Date   APPENDECTOMY     BREAST LUMPECTOMY     lt breast   CARDIAC CATHETERIZATION N/A 12/20/2015   Procedure: Left Heart Cath and Coronary Angiography;  Surgeon: Yvonne Kendall, MD:: Mild to moderate, nonobstructive coronary artery disease, including 15% distal LAD stenosis, 30% ulcerated mid LCx disease, and 40% mid RCA stenosis.  Upper normal left  ventricular filling pressure (LVEDP 15 mmHg).   RIGHT HEART CATH N/A 05/08/2016   Procedure: Right Heart Cath;  Surgeon: Dolores Patty, MD;  Location: [facility];; RA = 3, RV = 34/5, PA = 38/12 (21), PCW = 12; Fick cardiac output/index = 4.6/2.6; PVR = 1.9 WU, Ao sat = 96%; PA sat = 58%, 62%    TRANSTHORACIC ECHOCARDIOGRAM  11/2015   a) 11/2015: EF 40 and 45%.  GRII DD; b) 04/2016: EF 20% with grade 2 diastolic dysfunction   TRANSTHORACIC ECHOCARDIOGRAM  10/2016   a) EF 55-60%.  Normal LV function.  Mild DD.Marland Kitchen  Normal valve Fxn.; b) 09/2020: EF 50 to 55%.  Normal function.  No R WMA.  Mild LVH.  GR 2 DD with elevated LAP.-Mild LA dilation.  Moderately elevated PAP estimated RVSP 47 mmHg, and RAP 8 mmHg.   TRIGGER FINGER RELEASE Left 04/23/2017   Procedure: RELEASE TRIGGER FINGER TO LEFT FORTH FINGER;  Surgeon: Louisa Second, MD;  Location: [facility];  Service: Plastics;  Laterality: Left;    Allergies  Allergies  Allergen Reactions   Pregabalin     Other Reaction(s): feeling fatigue drunk feeling, falls   Tape Rash    Pt can tolerate the paper tape    History of Present Illness  Since being seen last the patient has gained about 15 to 17 pounds.  She is beginning to have some volume overload with fluid retention in the lower extremities to the pretibial area.  She has been eating a lot of canned foods and frozen foods as this is convenient for her.  When she was discharged from the hospital her weight was 104 pounds and today in the office 125 pounds.  Blood pressures gotten much better with decreased dose of Entresto and she is feeling better.  She has been eating better as well watching her sugar more than she has been salt content.  She is having some PND and some minimal orthopnea but then she often rolls over to her side.  She is not wearing her support hose as she states that they are too hard to put on with her legs swelling  Home Medications     Current Outpatient Medications  Medication Sig Dispense Refill   atorvastatin (LIPITOR) 40 MG tablet Take 1 tablet by mouth once daily 90 tablet 3   carvedilol (COREG) 3.125 MG tablet Take 1 tablet (3.125 mg total) by mouth 2 (two) times daily with a meal. 180 tablet 1   insulin NPH-regular Human (70-30) 100 UNIT/ML injection Inject 5 Units into the skin 2 (two) times daily with a meal. (Patient taking differently: Inject 10 Units into the skin daily with breakfast.)     sacubitril-valsartan (ENTRESTO) 24-26 MG Take 1 tablet by mouth 2 (two) times daily. 28 tablet 0   bumetanide (BUMEX) 1 MG tablet Take 1 tablet (1 mg total) by mouth daily. 90 tablet 1   empagliflozin (JARDIANCE) 10 MG TABS tablet Take 1 tablet (10 mg total) by mouth daily. (Patient not taking: Reported on 10/13/2022) 30 tablet 0   spironolactone (ALDACTONE) 25 MG tablet Take 0.5 tablets (12.5 mg total) by mouth daily. 90 tablet 3   No current facility-administered medications for this visit.     Family History    Family History  Problem Relation Age of Onset   Stroke Father    Diabetes Maternal Grandmother    She indicated that her mother is alive. She indicated that her father is deceased. She indicated that the status of her maternal grandmother is unknown.  Social History    Social History   Socioeconomic History   Marital status: Widowed    Spouse name: Not on file   Number of children: 2   Years of education: Not on file   Highest education level: Not on file  Occupational History   Occupation: retired  Tobacco Use   Smoking status: Former    Current packs/day: 0.00    Types: Cigarettes    Start date: 09/30/1982    Quit date: 09/29/2012    Years since quitting: 10.0   Smokeless tobacco: Never  Vaping Use   Vaping status: Never Used  Substance and Sexual Activity   Alcohol use: No   Drug use: No   Sexual activity: Not on file  Other Topics Concern   Not on file  Social History Narrative   Not on  file   Social Determinants of Health   Financial Resource Strain: Low Risk  (09/25/2022)   Overall Financial Resource Strain (CARDIA)    Difficulty of Paying Living Expenses: Not very hard  Food Insecurity: Patient Declined (09/25/2022)   Hunger Vital Sign    Worried About Running Out of Food in the Last Year: Patient declined    Barista  in the Last Year: Patient declined  Transportation Needs: No Transportation Needs (09/25/2022)   PRAPARE - Administrator, Civil Service (Medical): No    Lack of Transportation (Non-Medical): No  Physical Activity: Not on file  Stress: Not on file  Social Connections: Not on file  Intimate Partner Violence: Not At Risk (09/25/2022)   Humiliation, Afraid, Rape, and Kick questionnaire    Fear of Current or Ex-Partner: No    Emotionally Abused: No    Physically Abused: No    Sexually Abused: No     Review of Systems    General:  No chills, fever, night sweats or weight changes.  Cardiovascular:  No chest pain, dyspnea on exertion, positive for pretibial bilateral lower edema, orthopnea, palpitations, paroxysmal nocturnal dyspnea. Dermatological: No rash, lesions/masses Respiratory: No cough, dyspnea Urologic: No hematuria, dysuria Abdominal:   No nausea, vomiting, diarrhea, bright red blood per rectum, melena, or hematemesis Neurologic:  No visual changes, wkns, changes in mental status. All other systems reviewed and are otherwise negative except as noted above.   Physical Exam    VS:  BP (!) 142/64   Pulse 60   Ht 5' 3.6" (1.615 m)   Wt 125 lb 3.2 oz (56.8 kg)   SpO2 98%   BMI 21.76 kg/m  , BMI Body mass index is 21.76 kg/m.     GEN: Well nourished, well developed, in no acute distress. HEENT: normal. Neck: Supple, mild JVD/HJR at 45 degrees, no carotid bruits, or masses. Cardiac: RRR, no murmurs, rubs, or gallops. No clubbing, cyanosis, edema.  Radials/DP/PT 2+ and equal bilaterally.  Respiratory:  Respirations regular  and unlabored, clear to auscultation bilaterally. GI: Soft, nontender, nondistended, BS + x 4. MS: no deformity or atrophy. Skin: warm and dry, no rash. Neuro:  Strength and sensation are intact.  Very hard of hearing Psych: Normal affect.      Lab Results  Component Value Date   WBC 4.4 10/09/2022   HGB 12.6 10/09/2022   HCT 39.6 10/09/2022   MCV 91.0 10/09/2022   PLT 282 10/09/2022   Lab Results  Component Value Date   CREATININE 1.78 (H) 10/09/2022   BUN 54 (H) 10/09/2022   NA 137 10/09/2022   K 4.9 10/09/2022   CL 103 10/09/2022   CO2 22 10/09/2022   Lab Results  Component Value Date   ALT 33 10/09/2022   AST 36 10/09/2022   ALKPHOS 72 10/09/2022   BILITOT 0.6 10/09/2022   Lab Results  Component Value Date   CHOL 90 09/25/2022   HDL 34 (L) 09/25/2022   LDLCALC 50 09/25/2022   TRIG 32 09/25/2022   CHOLHDL 2.6 09/25/2022    Lab Results  Component Value Date   HGBA1C 11.0 (H) 09/26/2022     Review of Prior Studies    Echocardiogram 09/25/2022 1. Left ventricular ejection fraction, by estimation, is 30 to 35%. The  left ventricle has moderately decreased function. The left ventricle  demonstrates global hypokinesis. Left ventricular diastolic parameters are  indeterminate.   2. Right ventricular systolic function is moderately reduced. The right  ventricular size is moderately enlarged. There is moderately elevated  pulmonary artery systolic pressure.   3. Left atrial size was moderately dilated.   4. Right atrial size was moderately dilated.   5. The mitral valve is degenerative. Mild to moderate mitral valve  regurgitation.   6. The tricuspid valve is degenerative. Tricuspid valve regurgitation is  severe.   7. The  aortic valve is normal in structure. Aortic valve regurgitation is  not visualized.   8. The inferior vena cava is dilated in size with <50% respiratory  variability, suggesting right atrial pressure of 15 mmHg.   9. Cannot exclude a small  PFO.    Assessment & Plan   1.  Acute on chronic systolic CHF: EF of 30 to 35%; she is gaining a total of 21 pounds since being discharged from the hospital with her weight at 104 on discharge.  She has been having some lower extremity edema bilaterally and into pretibial areas.  She admits to eating salted food from a can and some frozen foods.  She has been eating more nutritional foods as well and has gained some healthy weight.  I will increase her Bumex to 2 mg daily for 3 days, and then return her to 1 mg daily thereafter, I will increase her spironolactone from 12.5 mg daily to 25 mg for 2 days and then return to 12.5 mg daily thereafter.  They are to continue daily weights and salt avoidance.  She is going to be given instructions on low-sodium diet.  The patient is advised that if the weight continues to be an issue, edema or worsening of symptoms, she should go to the ED at which time she may have IV diuretics placed.  She had been on Lasix but this was changed to Bumex during hospitalization.  BMET will be ordered in 4 days.  I will see her in 7 days.  Most recent creatinine on 10/09/2022 was 1.78. Potassium 4.9.   In the interim she will continue guideline directed medical therapy with Jardiance and carvedilol.  2.  Coronary artery disease: Nonocclusive per left heart cath in 2010.  Continue medical management with secondary prevention with statin therapy aspirin and low-cholesterol diet.  3.  Hypertension: Blood pressure was soft prior to decreasing Entresto from 4951 back down to 24/26 mg twice daily.  May need to consider increasing if she is having trouble with fluid retention.  I would like to see her blood pressure closer to 110/58 with reduced EF.  Will follow-up with her in a week to readjust medications based upon her response to adjustments that were done temporarily.  4.  Insulin-dependent diabetes: She has been much more strict on her sugar intake but has neglected salt intake.   Continue to be followed by PCP.         Signed, Bettey Mare. Liborio Nixon, ANP, AACC   11/02/2022 2:51 PM      Office [phone] Fax [phone]  Notice: This dictation was prepared with Dragon dictation along with smaller phrase technology. Any transcriptional errors that result from this process are unintentional and may not be corrected upon review.

## 2022-11-02 ENCOUNTER — Encounter: Payer: Self-pay | Admitting: Adult Health

## 2022-11-02 ENCOUNTER — Ambulatory Visit: Payer: Medicare Other | Attending: Adult Health | Admitting: Adult Health

## 2022-11-02 VITALS — BP 142/64 | HR 60 | Ht 63.6 in | Wt 125.2 lb

## 2022-11-02 DIAGNOSIS — E7800 Pure hypercholesterolemia, unspecified: Secondary | ICD-10-CM | POA: Diagnosis present

## 2022-11-02 DIAGNOSIS — I5022 Chronic systolic (congestive) heart failure: Secondary | ICD-10-CM | POA: Diagnosis present

## 2022-11-02 DIAGNOSIS — I10 Essential (primary) hypertension: Secondary | ICD-10-CM | POA: Diagnosis present

## 2022-11-02 DIAGNOSIS — R9431 Abnormal electrocardiogram [ECG] [EKG]: Secondary | ICD-10-CM | POA: Diagnosis present

## 2022-11-02 DIAGNOSIS — I739 Peripheral vascular disease, unspecified: Secondary | ICD-10-CM | POA: Insufficient documentation

## 2022-11-02 MED ORDER — SPIRONOLACTONE 25 MG PO TABS: 12.5000 mg | ORAL_TABLET | Freq: Every day | ORAL | 3 refills | Status: DC

## 2022-11-02 MED ORDER — BUMETANIDE 1 MG PO TABS: 1.0000 mg | ORAL_TABLET | Freq: Every day | ORAL | 1 refills | Status: DC

## 2022-11-02 NOTE — Patient Instructions (Signed)
Medication Instructions:  Bumex ( Take 2 Tablets for 3 Days on an Empty Stomach). Then Resume 1 Tablet Daily. Spironolactone ( Take whole Tablet for 2 Days). Then Resume 1/2 Tablet Daily. *If you need a refill on your cardiac medications before your next appointment, please call your pharmacy*   Lab Work: BMET  Next Week  If you have labs (blood work) drawn today and your tests are completely normal, you will receive your results only by: MyChart Message (if you have MyChart) OR A paper copy in the mail If you have any lab test that is abnormal or we need to change your treatment, we will call you to review the results.   Testing/Procedures: No Testing   Follow-Up: At [facility], you and your health needs are our priority.  As part of our continuing mission to provide you with exceptional heart care, we have created designated Provider Care Teams.  These Care Teams include your primary Cardiologist (physician) and Advanced Practice Providers (APPs -  Physician Assistants and Nurse Practitioners) who all work together to provide you with the care you need, when you need it.  We recommend signing up for the patient portal called "MyChart".  Sign up information is provided on this After Visit Summary.  MyChart is used to connect with patients for Virtual Visits (Telemedicine).  Patients are able to view lab/test results, encounter notes, upcoming appointments, etc.  Non-urgent messages can be sent to your provider as well.   To learn more about what you can do with MyChart, go to [email or web address].    Your next appointment:   1 week(s)  Provider:   Joni Reining, DNP, ANP

## 2022-11-09 LAB — LAB REPORT - SCANNED: EGFR: 36

## 2022-11-09 NOTE — Progress Notes (Signed)
Cardiology Clinic Note   Patient Name: [name] Date of Encounter: 11/10/2022  Primary Care Provider:  Darrow Bussing, MD Primary Cardiologist:  Ashley Lemma, MD  Patient Profile    77 year old female with history of PAD, tobacco abuse, diabetes, hyperlipidemia, CAD, chronic kidney disease.  Recent admission for acute HFrEF.  She had abnormal ABIs with 1.21 on the left and 0.88 in her mid left SFA.  She was referred to Dr. Allyson Sabal who did not feel as if she needed to have invasive evaluation of the left SFA.    The patient was found to have some hypotension and therefore Entresto was cut in half after a phone call from skilled nursing facility, by 8 with blood pressure reading of 98/48 sitting and 82/48 standing with a heart rate of 55 bpm.   On last office visit I increased her Bumex to 2 mg daily for 3 days and then return to 1 mg daily. increase her spironolactone from 12.5 mg daily to 25 mg for 2 days and then return to 12.5 mg daily thereafter.   Past Medical History    Past Medical History:  Diagnosis Date   Breast cancer (HCC)    breast - left    Chronic diastolic CHF (congestive heart failure), NYHA class 2 (HCC)    a. cMRI 5/10: EF 38% // b. Echo 3/13/: EF 50-55, Gr 1 DD // c. Echo 8/14: EF 40, inf/inf-septal HK, Gr 1 DD, mild MR // d. Echo 5/17: EF 40-45, inf HK, Gr 1 DD, mild MR, mild LAE, reduced RVSF, mild TR, PASP 33 // e. Echo 10/17: EF 40-45, Gr 2 DD, mild MR, severe LAE; => 09/2020 EF 50-55%, GrII DD.   Coronary artery disease, non-occlusive    a. nonobs by LHC in 2010 // b. Myoview 10/17: Lg infarct apex, distal ant and lat walls, no ischemia, EF 38; int risk  // c. LHC 10/17:  dLAD 15, pLCx 30, mRCA 40, LVEDP 15   Diabetes mellitus    NICM (nonischemic cardiomyopathy) (HCC)    Past Surgical History:  Procedure Laterality Date   APPENDECTOMY     BREAST LUMPECTOMY     lt breast   CARDIAC CATHETERIZATION N/A 12/20/2015   Procedure: Left Heart Cath and Coronary  Angiography;  Surgeon: Ashley Kendall, MD:: Mild to moderate, nonobstructive coronary artery disease, including 15% distal LAD stenosis, 30% ulcerated mid LCx disease, and 40% mid RCA stenosis.  Upper normal left ventricular filling pressure (LVEDP 15 mmHg).   RIGHT HEART CATH N/A 05/08/2016   Procedure: Right Heart Cath;  Surgeon: Ashley Patty, MD;  Location: [facility];; RA = 3, RV = 34/5, PA = 38/12 (21), PCW = 12; Fick cardiac output/index = 4.6/2.6; PVR = 1.9 WU, Ao sat = 96%; PA sat = 58%, 62%    TRANSTHORACIC ECHOCARDIOGRAM  11/2015   a) 11/2015: EF 40 and 45%.  GRII DD; b) 04/2016: EF 20% with grade 2 diastolic dysfunction   TRANSTHORACIC ECHOCARDIOGRAM  10/2016   a) EF 55-60%.  Normal LV function.  Mild DD.[name]  Normal valve Fxn.; b) 09/2020: EF 50 to 55%.  Normal function.  No R WMA.  Mild LVH.  GR 2 DD with elevated LAP.-Mild LA dilation.  Moderately elevated PAP estimated RVSP 47 mmHg, and RAP 8 mmHg.   TRIGGER FINGER RELEASE Left 04/23/2017   Procedure: RELEASE TRIGGER FINGER TO LEFT FORTH FINGER;  Surgeon: Louisa Second, MD;  Location: [facility];  Service:  Plastics;  Laterality: Left;    Allergies  Allergies  Allergen Reactions   Pregabalin     Other Reaction(s): feeling fatigue drunk feeling, falls   Tape Rash    Pt can tolerate the paper tape    History of Present Illness    [name] returns today for follow-up after being seen 1 week ago with evidence of volume overload and medication adjustments to increase spironolactone and Bumex for 3 days.  The patient has done well and lost approximately 4 pounds right away with increased doses of spironolactone, she has had significant reduction in volume and lower extremity edema with increased dose of Bumex for 3 days.  She has followed up with her primary care provider yesterday and follow-up labs were drawn.  We do not have results of those at this time.  She is felt more energetic, less short of  breath, has been working with physical therapy and is walking up to 3 blocks now.  She is brighter breathing better and denies any symptoms.  Home Medications    Current Outpatient Medications  Medication Sig Dispense Refill   atorvastatin (LIPITOR) 40 MG tablet Take 1 tablet by mouth once daily 90 tablet 3   bumetanide (BUMEX) 1 MG tablet Take 1 tablet (1 mg total) by mouth daily. 90 tablet 1   carvedilol (COREG) 3.125 MG tablet Take 1 tablet (3.125 mg total) by mouth 2 (two) times daily with a meal. 180 tablet 1   empagliflozin (JARDIANCE) 10 MG TABS tablet Take 1 tablet (10 mg total) by mouth daily. 30 tablet 0   insulin NPH-regular Human (70-30) 100 UNIT/ML injection Inject 5 Units into the skin 2 (two) times daily with a meal. (Patient taking differently: Inject 10 Units into the skin daily with breakfast.)     sacubitril-valsartan (ENTRESTO) 24-26 MG Take 1 tablet by mouth 2 (two) times daily. 28 tablet 0   spironolactone (ALDACTONE) 25 MG tablet Take 0.5 tablets (12.5 mg total) by mouth daily. 90 tablet 3   No current facility-administered medications for this visit.     Family History    Family History  Problem Relation Age of Onset   Stroke Father    Diabetes Maternal Grandmother    She indicated that her mother is alive. She indicated that her father is deceased. She indicated that the status of her maternal grandmother is unknown.  Social History    Social History   Socioeconomic History   Marital status: Widowed    Spouse name: Not on file   Number of children: 2   Years of education: Not on file   Highest education level: Not on file  Occupational History   Occupation: retired  Tobacco Use   Smoking status: Former    Current packs/day: 0.00    Types: Cigarettes    Start date: 09/30/1982    Quit date: 09/29/2012    Years since quitting: 10.1   Smokeless tobacco: Never  Vaping Use   Vaping status: Never Used  Substance and Sexual Activity   Alcohol use: No    Drug use: No   Sexual activity: Not on file  Other Topics Concern   Not on file  Social History Narrative   Not on file   Social Determinants of Health   Financial Resource Strain: Low Risk  (09/25/2022)   Overall Financial Resource Strain (CARDIA)    Difficulty of Paying Living Expenses: Not very hard  Food Insecurity: Patient Declined (09/25/2022)   Hunger Vital Sign  Worried About Programme researcher, broadcasting/film/video in the Last Year: Patient declined    Barista in the Last Year: Patient declined  Transportation Needs: No Transportation Needs (09/25/2022)   PRAPARE - Administrator, Civil Service (Medical): No    Lack of Transportation (Non-Medical): No  Physical Activity: Not on file  Stress: Not on file  Social Connections: Not on file  Intimate Partner Violence: Not At Risk (09/25/2022)   Humiliation, Afraid, Rape, and Kick questionnaire    Fear of Current or Ex-Partner: No    Emotionally Abused: No    Physically Abused: No    Sexually Abused: No     Review of Systems    General:  No chills, fever, night sweats or weight changes.  Cardiovascular:  No chest pain, dyspnea on exertion, edema, orthopnea, palpitations, paroxysmal nocturnal dyspnea. Dermatological: No rash, lesions/masses Respiratory: No cough, dyspnea Urologic: No hematuria, dysuria Abdominal:   No nausea, vomiting, diarrhea, bright red blood per rectum, melena, or hematemesis Neurologic:  No visual changes, wkns, changes in mental status. All other systems reviewed and are otherwise negative except as noted above.       Physical Exam    VS:  BP (!) 142/62   Pulse 72   Ht 5' 3.6" (1.615 m)   Wt 125 lb (56.7 kg)   SpO2 98%   BMI 21.73 kg/m  , BMI Body mass index is 21.73 kg/m.     GEN: Well nourished, well developed, in no acute distress. HEENT: normal. Neck: Supple, no JVD, carotid bruits, or masses. Cardiac: RRR, no murmurs, rubs, or gallops. No clubbing, cyanosis, edema.  Radials/DP/PT 2+ and  equal bilaterally.  Respiratory:  Respirations regular and unlabored, clear to auscultation bilaterally. GI: Soft, nontender, nondistended, BS + x 4. MS: no deformity or atrophy. Skin: warm and dry, no rash. Neuro:  Strength and sensation are intact. Psych: Normal affect.      Lab Results  Component Value Date   WBC 4.4 10/09/2022   HGB 12.6 10/09/2022   HCT 39.6 10/09/2022   MCV 91.0 10/09/2022   PLT 282 10/09/2022   Lab Results  Component Value Date   CREATININE 1.78 (H) 10/09/2022   BUN 54 (H) 10/09/2022   NA 137 10/09/2022   K 4.9 10/09/2022   CL 103 10/09/2022   CO2 22 10/09/2022   Lab Results  Component Value Date   ALT 33 10/09/2022   AST 36 10/09/2022   ALKPHOS 72 10/09/2022   BILITOT 0.6 10/09/2022   Lab Results  Component Value Date   CHOL 90 09/25/2022   HDL 34 (L) 09/25/2022   LDLCALC 50 09/25/2022   TRIG 32 09/25/2022   CHOLHDL 2.6 09/25/2022    Lab Results  Component Value Date   HGBA1C 11.0 (H) 09/26/2022     Review of Prior Studies     Echocardiogram 09/25/2022 1. Left ventricular ejection fraction, by estimation, is 30 to 35%. The  left ventricle has moderately decreased function. The left ventricle  demonstrates global hypokinesis. Left ventricular diastolic parameters are  indeterminate.   2. Right ventricular systolic function is moderately reduced. The right  ventricular size is moderately enlarged. There is moderately elevated  pulmonary artery systolic pressure.   3. Left atrial size was moderately dilated.   4. Right atrial size was moderately dilated.   5. The mitral valve is degenerative. Mild to moderate mitral valve  regurgitation.   6. The tricuspid valve is degenerative. Tricuspid valve  regurgitation is  severe.   7. The aortic valve is normal in structure. Aortic valve regurgitation is  not visualized.   8. The inferior vena cava is dilated in size with <50% respiratory  variability, suggesting right atrial pressure of 15  mmHg.   9. Cannot exclude a small PFO.   Assessment & Plan   1.  HFrEF: Patient with a EF of 30 to 35%.  On last office visit there was evidence of volume overloaded and I increased her spironolactone and Bumex temporarily for 3 days.  She is back to baseline feeling much better breathing better and has more energy.  She has had follow-up labs with primary care provider which we will look for once available.  I have advised her if she gains 2 to 3 pounds in 24 hours, has evidence of lower extremity edema she is to take extra doses of Bumex from 1 mg to 2 mg for 3 days along with going up on her spironolactone to 25 mg daily for 3 days.  If this has become a persistent event of volume overload will increase Bumex to 2 mg daily consistently, and then consider going up on spironolactone to 25 mg daily if necessary.  Will await labs to evaluate kidney function and potassium status.  Of note, the patient is no longer taking Jardiance as this caused profound hypotension.  Consider titrating Entresto to 49/51 mg if blood pressure will tolerate on follow-up visit.  Would like to keep her blood pressure less than 120/60.  2.  Hypertension: Blood pressure slightly elevated today and not optimal for reduced EF.  She is to keep taking her blood pressure at home.  May need to increase spironolactone should blood pressure be greater then 120/60 without symptoms of dizziness or near syncope which she experienced on Jardiance.  She is to continue low-sodium diet.  3.  Insulin-dependent diabetes: Followed by primary care.  Labs have recently been drawn will await results.        Signed, Bettey Mare. Liborio Nixon, ANP, AACC   11/10/2022 4:14 PM      Office [phone] Fax [phone]  Notice: This dictation was prepared with Dragon dictation along with smaller phrase technology. Any transcriptional errors that result from this process are unintentional and may not be corrected upon review.

## 2022-11-10 ENCOUNTER — Encounter: Payer: Self-pay | Admitting: Adult Health

## 2022-11-10 ENCOUNTER — Ambulatory Visit: Payer: Medicare Other | Attending: Adult Health | Admitting: Adult Health

## 2022-11-10 VITALS — BP 142/62 | HR 72 | Ht 63.6 in | Wt 125.0 lb

## 2022-11-10 DIAGNOSIS — E1169 Type 2 diabetes mellitus with other specified complication: Secondary | ICD-10-CM | POA: Insufficient documentation

## 2022-11-10 DIAGNOSIS — I10 Essential (primary) hypertension: Secondary | ICD-10-CM | POA: Insufficient documentation

## 2022-11-10 DIAGNOSIS — E785 Hyperlipidemia, unspecified: Secondary | ICD-10-CM | POA: Diagnosis present

## 2022-11-10 DIAGNOSIS — Z794 Long term (current) use of insulin: Secondary | ICD-10-CM

## 2022-11-10 DIAGNOSIS — I5022 Chronic systolic (congestive) heart failure: Secondary | ICD-10-CM | POA: Diagnosis present

## 2022-11-10 DIAGNOSIS — N1832 Chronic kidney disease, stage 3b: Secondary | ICD-10-CM | POA: Insufficient documentation

## 2022-11-10 NOTE — Patient Instructions (Signed)
Medication Instructions:  No Changes *If you need a refill on your cardiac medications before your next appointment, please call your pharmacy*   Lab Work: No Labs If you have labs (blood work) drawn today and your tests are completely normal, you will receive your results only by: MyChart Message (if you have MyChart) OR A paper copy in the mail If you have any lab test that is abnormal or we need to change your treatment, we will call you to review the results.   Testing/Procedures: No Testing   Follow-Up: At [facility], you and your health needs are our priority.  As part of our continuing mission to provide you with exceptional heart care, we have created designated Provider Care Teams.  These Care Teams include your primary Cardiologist (physician) and Advanced Practice Providers (APPs -  Physician Assistants and Nurse Practitioners) who all work together to provide you with the care you need, when you need it.  We recommend signing up for the patient portal called "MyChart".  Sign up information is provided on this After Visit Summary.  MyChart is used to connect with patients for Virtual Visits (Telemedicine).  Patients are able to view lab/test results, encounter notes, upcoming appointments, etc.  Non-urgent messages can be sent to your provider as well.   To learn more about what you can do with MyChart, go to [email or web address].    Your next appointment:   3 month(s)  Provider:   Joni Reining, DNP, ANP

## 2022-11-29 ENCOUNTER — Telehealth: Payer: Self-pay | Admitting: Cardiovascular Disease

## 2022-11-29 NOTE — Telephone Encounter (Signed)
Fax was received and faxed back on 9/27, with confirmation of successful fax.

## 2022-11-29 NOTE — Telephone Encounter (Signed)
[name], is the person who has called. The order for 9/3 for Home Health is a medication to reduce Entresto.  [name] has al;ready been reduced but the order will state parameters.  Please sign and fax back [phone].  She is going to refax to our number [phone] now.

## 2022-11-29 NOTE — Telephone Encounter (Signed)
[name] is calling about an outstanding order that they faxed over. Please advise

## 2022-11-29 NOTE — Telephone Encounter (Signed)
Notified their office this was sent and confirmed.  She ask it be sent to her personal work fax.  Refaxed with confirmation of success.

## 2022-11-29 NOTE — Telephone Encounter (Signed)
Caller Burna Mortimer) stated they will need order# [id number] to be signed and dated by Dr. Allyson Sabal and faxed back to them as that order is currently out of compliance.  Caller noted order# [id number] was already completed and received.

## 2022-11-29 NOTE — Telephone Encounter (Signed)
They have called back to state this is the wrong order.  She states they already have this order.  She states there is another order. Unaware there was more than one order as this was never mentioned this in prior conversation.  Advised to fax it and will be signed when able by provider

## 2022-11-30 NOTE — Telephone Encounter (Signed)
Just to clarify, this is a Dr. Herbie Baltimore pt. Paperwork given to Dr. Elissa Hefty nurse. Dr. Herbie Baltimore is back in the office on Monday and can address then.

## 2022-12-04 ENCOUNTER — Other Ambulatory Visit (HOSPITAL_COMMUNITY): Payer: Self-pay

## 2022-12-04 ENCOUNTER — Encounter (HOSPITAL_COMMUNITY): Payer: Self-pay | Admitting: Cardiology

## 2022-12-04 ENCOUNTER — Ambulatory Visit (HOSPITAL_COMMUNITY)
Admission: RE | Admit: 2022-12-04 | Discharge: 2022-12-04 | Disposition: A | Discharge status: Home / Self Care | Payer: Medicare Other | Source: Ambulatory Visit | Attending: Cardiology | Admitting: Cardiology

## 2022-12-04 VITALS — BP 154/82 | HR 75 | Wt 129.0 lb

## 2022-12-04 DIAGNOSIS — I5042 Chronic combined systolic (congestive) and diastolic (congestive) heart failure: Secondary | ICD-10-CM | POA: Insufficient documentation

## 2022-12-04 DIAGNOSIS — I5022 Chronic systolic (congestive) heart failure: Secondary | ICD-10-CM

## 2022-12-04 DIAGNOSIS — I2510 Atherosclerotic heart disease of native coronary artery without angina pectoris: Secondary | ICD-10-CM | POA: Insufficient documentation

## 2022-12-04 DIAGNOSIS — I428 Other cardiomyopathies: Secondary | ICD-10-CM | POA: Insufficient documentation

## 2022-12-04 DIAGNOSIS — Z79899 Other long term (current) drug therapy: Secondary | ICD-10-CM | POA: Diagnosis not present

## 2022-12-04 DIAGNOSIS — E1151 Type 2 diabetes mellitus with diabetic peripheral angiopathy without gangrene: Secondary | ICD-10-CM

## 2022-12-04 DIAGNOSIS — C50919 Malignant neoplasm of unspecified site of unspecified female breast: Secondary | ICD-10-CM | POA: Insufficient documentation

## 2022-12-04 DIAGNOSIS — N183 Chronic kidney disease, stage 3 unspecified: Secondary | ICD-10-CM | POA: Diagnosis not present

## 2022-12-04 DIAGNOSIS — I10 Essential (primary) hypertension: Secondary | ICD-10-CM

## 2022-12-04 DIAGNOSIS — E785 Hyperlipidemia, unspecified: Secondary | ICD-10-CM | POA: Insufficient documentation

## 2022-12-04 DIAGNOSIS — I130 Hypertensive heart and chronic kidney disease with heart failure and stage 1 through stage 4 chronic kidney disease, or unspecified chronic kidney disease: Secondary | ICD-10-CM | POA: Insufficient documentation

## 2022-12-04 DIAGNOSIS — E1122 Type 2 diabetes mellitus with diabetic chronic kidney disease: Secondary | ICD-10-CM | POA: Insufficient documentation

## 2022-12-04 DIAGNOSIS — N1832 Chronic kidney disease, stage 3b: Secondary | ICD-10-CM | POA: Diagnosis not present

## 2022-12-04 DIAGNOSIS — E1169 Type 2 diabetes mellitus with other specified complication: Secondary | ICD-10-CM

## 2022-12-04 LAB — BASIC METABOLIC PANEL
Anion gap: 9 (ref 5–15)
BUN: 48 mg/dL — ABNORMAL HIGH (ref 8–23)
CO2: 24 mmol/L (ref 22–32)
Calcium: 9.4 mg/dL (ref 8.9–10.3)
Chloride: 106 mmol/L (ref 98–111)
Creatinine, Ser: 1.46 mg/dL — ABNORMAL HIGH (ref 0.44–1.00)
GFR, Estimated: 37 mL/min — ABNORMAL LOW (ref >60)
Glucose, Bld: 269 mg/dL — ABNORMAL HIGH (ref 70–99)
Potassium: 4.5 mmol/L (ref 3.5–5.1)
Sodium: 139 mmol/L (ref 135–145)

## 2022-12-04 LAB — BRAIN NATRIURETIC PEPTIDE: B Natriuretic Peptide: 3295.2 pg/mL — ABNORMAL HIGH (ref 0.0–100.0)

## 2022-12-04 MED ORDER — EMPAGLIFLOZIN 10 MG PO TABS: 10.0000 mg | ORAL_TABLET | Freq: Every day | ORAL | Status: DC

## 2022-12-04 NOTE — Patient Instructions (Signed)
Medication Changes:  TAKE AN EXTRA DOSE OF BUMEX TODAY, AND MAY TAKE AN EXTRA DOSE TOMORROW IF NEEDED FOR FLUID   START: JARDIANCE 10MG  ONCE DAILY- SAMPLES GIVEN TODAY- PLEASE BRING BACK PROOF OF INCOME FOR PATIENT ASSISTANCE APPLICATION FOR THIS MEDICATION   Lab Work:  Labs done today, your results will be available in MyChart, we will contact you for abnormal readings.  Follow-Up in: 3 WEEKS AS SCHEDULED WITH PHARMACY AND THEN AGAIN IN 2 MONTHS AS SCHEDULED WITH DR. Gasper Lloyd   At [facility], you and your health needs are our priority. We have a designated team specialized in the treatment of Heart Failure. This Care Team includes your primary Heart Failure Specialized Cardiologist (physician), Advanced Practice Providers (APPs- Physician Assistants and Nurse Practitioners), and Pharmacist who all work together to provide you with the care you need, when you need it.   You may see any of the following providers on your designated Care Team at your next follow up:  Dr. Arvilla Meres Dr. Marca Ancona Dr. Dorthula Nettles Dr. Theresia Bough Tonye Becket, NP Robbie Lis, [place] [facility], [place] Brynda Peon, NP [place] Wert, NP Karle Plumber, PharmD   Please be sure to bring in all your medications bottles to every appointment.   Need to Contact us:  If you have any questions or concerns before your next appointment please send [place] a message through [place] or call our office at [phone].    TO LEAVE A MESSAGE FOR THE NURSE SELECT OPTION 2, PLEASE LEAVE A MESSAGE INCLUDING: YOUR NAME DATE OF BIRTH CALL BACK NUMBER REASON FOR CALL**this is important as we prioritize the call backs  YOU WILL RECEIVE A CALL BACK THE SAME DAY AS LONG AS YOU CALL BEFORE 4:00 PM

## 2022-12-04 NOTE — Progress Notes (Signed)
Medication Samples have been provided to the patient.  Drug name: Jardiance        Strength: 10mg         Qty: 4  LOT: [id number]  Exp.Date: 11/2024  Dosing instructions: take one tablet once daily   The patient has been instructed regarding the correct time, dose, and frequency of taking this medication, including desired effects and most common side effects.   Maude Gloor B Colson Barco 3:11 PM 12/04/2022

## 2022-12-04 NOTE — Progress Notes (Signed)
ADVANCED HEART FAILURE CLINIC NOTE  Referring Physician: Darrow Bussing, MD  Primary Care: Darrow Bussing, MD Primary Cardiologist: Dr. Herbie Baltimore  HPI: [name] is a 77 y.o. female with peripheral arterial disease, tobacco use, type 2 diabetes, hyperlipidemia, coronary artery disease, CKD, breast cancer status postlumpectomy/radiation and chemotherapy (tamoxifen) and heart failure with reduced ejection fraction presenting today to establish care.  Her cardiac history dates back to at least May 2010 when she had a cardiac MRI with a EF of 35 to 40% and inferior/inferior lateral wall hypokinesis.  Follow-up echocardiogram in 2013 with improvement in EF to 55%.October 2017 left heart cath demonstrated mild to moderate nonobstructive CAD.  Her LV function remained mostly stable until March 2018 when she had an echo with EF of 20% and diffuse hypokinesis.  Right heart catheterization at that time with preserved cardiac index and low filling pressures.  She was admitted in January 2024 for decompensated heart failure during which time EF dropped to 35% with global hypokinesis and grade 3 diastolic dysfunction.  She was diuresed and started on GDMT.  She was once again admitted in 09/25/2022 for decompensated heart failure during which time she was once more diuresed started on GDMT and discharged home.  Since that time she has followed with Dr. Herbie Baltimore and presents today to establish care.  Interval hx:  Reports since IV diuresis in the hospital, she feels much better. Her appetite has improved. She is participating in physical therapy twice weekly with another 8 weeks of PT left. Reports virtually no shortness of breath. She does get fatigued after walking roughly 30 minutes in her neighborhood. She is no longer using a walker or cane.   Activity level/exercise tolerance:  NYHA IIB Orthopnea:  Sleeps on 2 pillows Paroxysmal noctural dyspnea:  No Chest pain/pressure:  No Orthostatic lightheadedness:   No Palpitations:  No Lower extremity edema:  No Presyncope/syncope:  No Cough:  No  Past Medical History:  Diagnosis Date   Breast cancer (HCC)    breast - left    Chronic diastolic CHF (congestive heart failure), NYHA class 2 (HCC)    a. cMRI 5/10: EF 38% // b. Echo 3/13/: EF 50-55, Gr 1 DD // c. Echo 8/14: EF 40, inf/inf-septal HK, Gr 1 DD, mild MR // d. Echo 5/17: EF 40-45, inf HK, Gr 1 DD, mild MR, mild LAE, reduced RVSF, mild TR, PASP 33 // e. Echo 10/17: EF 40-45, Gr 2 DD, mild MR, severe LAE; => 09/2020 EF 50-55%, GrII DD.   Coronary artery disease, non-occlusive    a. nonobs by LHC in 2010 // b. Myoview 10/17: Lg infarct apex, distal ant and lat walls, no ischemia, EF 38; int risk  // c. LHC 10/17:  dLAD 15, pLCx 30, mRCA 40, LVEDP 15   Diabetes mellitus    NICM (nonischemic cardiomyopathy) (HCC)     Current Outpatient Medications  Medication Sig Dispense Refill   atorvastatin (LIPITOR) 40 MG tablet Take 1 tablet by mouth once daily 90 tablet 3   bumetanide (BUMEX) 1 MG tablet Take 1 tablet (1 mg total) by mouth daily. 90 tablet 1   carvedilol (COREG) 3.125 MG tablet Take 1 tablet (3.125 mg total) by mouth 2 (two) times daily with a meal. 180 tablet 1   empagliflozin (JARDIANCE) 10 MG TABS tablet Take 1 tablet (10 mg total) by mouth daily before breakfast.     insulin glargine (LANTUS) 100 UNIT/ML Solostar Pen Inject 10 Units into the skin daily.  insulin NPH-regular Human (70-30) 100 UNIT/ML injection Inject 5 Units into the skin 2 (two) times daily with a meal. (Patient taking differently: Inject 10 Units into the skin daily with breakfast.)     sacubitril-valsartan (ENTRESTO) 24-26 MG Take 1 tablet by mouth 2 (two) times daily. 28 tablet 0   spironolactone (ALDACTONE) 25 MG tablet Take 0.5 tablets (12.5 mg total) by mouth daily. 90 tablet 3   No current facility-administered medications for this encounter.    Allergies  Allergen Reactions   Pregabalin     Other  Reaction(s): feeling fatigue drunk feeling, falls   Tape Rash    Pt can tolerate the paper tape      Social History   Socioeconomic History   Marital status: Widowed    Spouse name: Not on file   Number of children: 2   Years of education: Not on file   Highest education level: Not on file  Occupational History   Occupation: retired  Tobacco Use   Smoking status: Former    Current packs/day: 0.00    Types: Cigarettes    Start date: 09/30/1982    Quit date: 09/29/2012    Years since quitting: 10.1   Smokeless tobacco: Never  Vaping Use   Vaping status: Never Used  Substance and Sexual Activity   Alcohol use: No   Drug use: No   Sexual activity: Not on file  Other Topics Concern   Not on file  Social History Narrative   Not on file   Social Determinants of Health   Financial Resource Strain: Low Risk  (09/25/2022)   Overall Financial Resource Strain (CARDIA)    Difficulty of Paying Living Expenses: Not very hard  Food Insecurity: Patient Declined (09/25/2022)   Hunger Vital Sign    Worried About Running Out of Food in the Last Year: Patient declined    Ran Out of Food in the Last Year: Patient declined  Transportation Needs: No Transportation Needs (09/25/2022)   PRAPARE - Administrator, Civil Service (Medical): No    Lack of Transportation (Non-Medical): No  Physical Activity: Not on file  Stress: Not on file  Social Connections: Not on file  Intimate Partner Violence: Not At Risk (09/25/2022)   Humiliation, Afraid, Rape, and Kick questionnaire    Fear of Current or Ex-Partner: No    Emotionally Abused: No    Physically Abused: No    Sexually Abused: No      Family History  Problem Relation Age of Onset   Stroke Father    Diabetes Maternal Grandmother     PHYSICAL EXAM: Vitals:   12/04/22 1437  BP: (!) 154/82  Pulse: 75  SpO2: 99%   GENERAL: Well nourished, well developed, and in no apparent distress at rest.  HEENT: Negative for arcus  senilis or xanthelasma. There is no scleral icterus.  The mucous membranes are pink and moist.   NECK: Supple, No masses. Normal carotid upstrokes without bruits. No masses or thyromegaly.    CHEST: There are no chest wall deformities. There is no chest wall tenderness. Respirations are unlabored.  Lungs- CTA B/L CARDIAC:  JVP: 10-12 cm H2O         Normal S1, S2  Normal rate with regular rhythm. No murmurs, rubs or gallops.  Pulses are 2+ and symmetrical in upper and lower extremities. 2+ edema.  ABDOMEN: Soft, non-tender, non-distended. There are no masses or hepatomegaly. There are normal bowel sounds.  EXTREMITIES: Warm and well  perfused with no cyanosis, clubbing.  LYMPHATIC: No axillary or supraclavicular lymphadenopathy.  NEUROLOGIC: Patient is oriented x3 with no focal or lateralizing neurologic deficits.  PSYCH: Patients affect is appropriate, there is no evidence of anxiety or depression.  SKIN: Warm and dry; no lesions or wounds.   DATA REVIEW  ECG: NSR  As per my personal interpretation  ECHO: 09/25/2022: LVEF 30 to 35%, moderately reduced RV function. 02/27/2022: LVEF 35 to 40%, grade 3 diastolic dysfunction 09/20/2020: LVEF 50 to 55% 3/16//18: LVEF 20% 06/28/15: LVEF 40-45%  CMR 06/27/2008:  1.  Moderate LV systolic dysfunction with EF 38%.  The inferior and  inferolateral basilar wall appear segmentally worse.   2.  Very small area of mid-wall delayed enhancement in the  inferolateral base. This is nonspecific and can be seen with  infiltrative diseases such as sarcoid as well as with myocarditis.   CATH: 05/08/16:  RA = 3 RV = 34/5 PA = 38/12 (21) PCW = 12 Fick cardiac output/index = 4.6/2.6 PVR = 1.9 WU Ao sat = 96% PA sat = 58%, 62%  12/20/15:  Mild to moderate, nonobstructive coronary artery disease, including 15% distal LAD stenosis, 30% ulcerated mid LCx disease, and 40% mid RCA stenosis. Upper normal left ventricular filling pressure (LVEDP 15  mmHg)    ASSESSMENT & PLAN:  Heart failure reduced ejection fraction Etiology of HF: Nonischemic cardiomyopathy, dating back to 2010. NYHA class / AHA Stage:iib Volume status & Diuretics: mildly hypervolemic; taking bumex 1mg  daily; take extra bumex today. Discussed daily weights with her.  Vasodilators:Entresto 24/26mg ; will increase at follow up.  Beta-Blocker:coreg 3.125mg  BID MRA: spironolactone 12.5mg  daily Cardiometabolic:will start SGLT2i today Devices therapies & Valvulopathies:not indicated  Advanced therapies:not indicated  2.  Hypertension -Hypertensive today.  Reports that her blood pressure is higher than usual.  Will schedule pharmacy follow-up in 3 weeks with plan to uptitrate Entresto at that time.  Starting SGLT2 inhibitor today.  3.  Type 2 diabetes -Discussed importance of strict diabetic control due to elevated A1c.  Start SGLT2 inhibitor today.  4.  Peripheral arterial disease -Reports having some pain in her legs after walking more than 30 minutes however ABIs only mild to moderate PAD.  Reviewed by Dr. Gery Pray and at this time no need for intervention.  5.  CKD stage III -Repeat lab work today.  Starting SGLT2 inhibitor for CKD HFrEF and type 2 diabetes.  6.  Hyperlipidemia - lipitor 40mg  daily, no myalgias.  7.  Breast cancer status postlumpectomy/radiation -In remission.   Masiyah Jorstad Advanced Heart Failure Mechanical Circulatory Support

## 2022-12-14 ENCOUNTER — Telehealth: Payer: Self-pay | Admitting: Cardiovascular Disease

## 2022-12-14 NOTE — Telephone Encounter (Signed)
[name] is calling to follow up on getting the paperwork signed and faxed over from the phone note on 10/09. Please advise

## 2022-12-14 NOTE — Telephone Encounter (Signed)
Do not see note that order still needed had been sent to [facility] ([facility]). Retrieved documents from [employer]base and gave them to Dr. Erich Montane nurse today.

## 2022-12-18 ENCOUNTER — Telehealth: Payer: Self-pay | Admitting: *Deleted

## 2022-12-18 NOTE — Telephone Encounter (Signed)
Left message for both patient and  daughter to contact office.  Inquiring if they received a letter from [employer] to re enroll for assistance with Entresto.   If so , can they complete form and bring to office or discuss with advance heart failure clinid

## 2022-12-19 ENCOUNTER — Telehealth: Payer: Self-pay | Admitting: Cardiovascular Disease

## 2022-12-19 NOTE — Telephone Encounter (Signed)
Follow Up:     Patient is calling back, she says she has all the paper work and will bring it on 01-29-23 when she comes for her appointment with Joni Reining.

## 2022-12-19 NOTE — Telephone Encounter (Signed)
Called and spoke to [place]. She did fax a new form for Dr Allyson Sabal to sign. Form in [place].

## 2022-12-19 NOTE — Telephone Encounter (Signed)
New Message:      Burna Mortimer called and said she did received the order. She said it was signed by Dr Herbie Baltimore. She says it must be signed by Dr Allyson Sabal. She said just re-faxed the order to [phone].h

## 2022-12-22 NOTE — Telephone Encounter (Signed)
Spoke with Burna Mortimer at [place] regarding order sent over for Dr. Allyson Sabal to sign, explained that Dr. Herbie Baltimore is the pt's primary cardiologist and was the ordering provider regarding pt's entresto dosage. Mistakenly note was labeled for Dr. Allyson Sabal, however he did not order change in entresto dose. Burna Mortimer states that she will need the ordering provider to sign orders. She will put Dr. Elissa Hefty name on order and fax over to [place]. Fax number confirmed.

## 2022-12-25 ENCOUNTER — Other Ambulatory Visit (HOSPITAL_COMMUNITY): Payer: Medicare Other

## 2023-01-28 NOTE — Progress Notes (Incomplete)
ADVANCED HEART FAILURE CLINIC NOTE  Referring Physician: Darrow Bussing, MD  Primary Care: Darrow Bussing, MD Primary Cardiologist: Dr. Herbie Baltimore  HPI: [name] is a 77 y.o. female with peripheral arterial disease, tobacco use, type 2 diabetes, hyperlipidemia, coronary artery disease, CKD, breast cancer status postlumpectomy/radiation and chemotherapy (tamoxifen) and heart failure with reduced ejection fraction presenting today to establish care.  Her cardiac history dates back to at least May 2010 when she had a cardiac MRI with a EF of 35 to 40% and inferior/inferior lateral wall hypokinesis.  Follow-up echocardiogram in 2013 with improvement in EF to 55%.October 2017 left heart cath demonstrated mild to moderate nonobstructive CAD.  Her LV function remained mostly stable until March 2018 when she had an echo with EF of 20% and diffuse hypokinesis.  Right heart catheterization at that time with preserved cardiac index and low filling pressures.  She was admitted in January 2024 for decompensated heart failure during which time EF dropped to 35% with global hypokinesis and grade 3 diastolic dysfunction.  She was diuresed and started on GDMT.  She was once again admitted in 09/25/2022 for decompensated heart failure during which time she was once more diuresed started on GDMT and discharged home.  Since that time she has followed with Dr. Herbie Baltimore and presents today to establish care.  Interval hx:  Reports since IV diuresis in the hospital, she feels much better. Her appetite has improved. She is participating in physical therapy twice weekly with another 8 weeks of PT left. Reports virtually no shortness of breath. She does get fatigued after walking roughly 30 minutes in her neighborhood. She is no longer using a walker or cane.   Activity level/exercise tolerance:  NYHA IIB Orthopnea:  Sleeps on 2 pillows Paroxysmal noctural dyspnea:  No Chest pain/pressure:  No Orthostatic lightheadedness:   No Palpitations:  No Lower extremity edema:  No Presyncope/syncope:  No Cough:  No  Past Medical History:  Diagnosis Date   Breast cancer (HCC)    breast - left    Chronic diastolic CHF (congestive heart failure), NYHA class 2 (HCC)    a. cMRI 5/10: EF 38% // b. Echo 3/13/: EF 50-55, Gr 1 DD // c. Echo 8/14: EF 40, inf/inf-septal HK, Gr 1 DD, mild MR // d. Echo 5/17: EF 40-45, inf HK, Gr 1 DD, mild MR, mild LAE, reduced RVSF, mild TR, PASP 33 // e. Echo 10/17: EF 40-45, Gr 2 DD, mild MR, severe LAE; => 09/2020 EF 50-55%, GrII DD.   Coronary artery disease, non-occlusive    a. nonobs by LHC in 2010 // b. Myoview 10/17: Lg infarct apex, distal ant and lat walls, no ischemia, EF 38; int risk  // c. LHC 10/17:  dLAD 15, pLCx 30, mRCA 40, LVEDP 15   Diabetes mellitus    NICM (nonischemic cardiomyopathy) (HCC)     Current Outpatient Medications  Medication Sig Dispense Refill   atorvastatin (LIPITOR) 40 MG tablet Take 1 tablet by mouth once daily 90 tablet 3   bumetanide (BUMEX) 1 MG tablet Take 1 tablet (1 mg total) by mouth daily. 90 tablet 1   carvedilol (COREG) 3.125 MG tablet Take 1 tablet (3.125 mg total) by mouth 2 (two) times daily with a meal. 180 tablet 1   empagliflozin (JARDIANCE) 10 MG TABS tablet Take 1 tablet (10 mg total) by mouth daily before breakfast.     insulin glargine (LANTUS) 100 UNIT/ML Solostar Pen Inject 10 Units into the skin daily.  insulin NPH-regular Human (70-30) 100 UNIT/ML injection Inject 5 Units into the skin 2 (two) times daily with a meal. (Patient taking differently: Inject 10 Units into the skin daily with breakfast.)     sacubitril-valsartan (ENTRESTO) 24-26 MG Take 1 tablet by mouth 2 (two) times daily. 28 tablet 0   spironolactone (ALDACTONE) 25 MG tablet Take 0.5 tablets (12.5 mg total) by mouth daily. 90 tablet 3   No current facility-administered medications for this visit.    Allergies  Allergen Reactions   Pregabalin     Other  Reaction(s): feeling fatigue drunk feeling, falls   Tape Rash    Pt can tolerate the paper tape      Social History   Socioeconomic History   Marital status: Widowed    Spouse name: Not on file   Number of children: 2   Years of education: Not on file   Highest education level: Not on file  Occupational History   Occupation: retired  Tobacco Use   Smoking status: Former    Current packs/day: 0.00    Types: Cigarettes    Start date: 09/30/1982    Quit date: 09/29/2012    Years since quitting: 10.3   Smokeless tobacco: Never  Vaping Use   Vaping status: Never Used  Substance and Sexual Activity   Alcohol use: No   Drug use: No   Sexual activity: Not on file  Other Topics Concern   Not on file  Social History Narrative   Not on file   Social Determinants of Health   Financial Resource Strain: Low Risk  (09/25/2022)   Overall Financial Resource Strain (CARDIA)    Difficulty of Paying Living Expenses: Not very hard  Food Insecurity: Patient Declined (09/25/2022)   Hunger Vital Sign    Worried About Running Out of Food in the Last Year: Patient declined    Ran Out of Food in the Last Year: Patient declined  Transportation Needs: No Transportation Needs (09/25/2022)   PRAPARE - Administrator, Civil Service (Medical): No    Lack of Transportation (Non-Medical): No  Physical Activity: Not on file  Stress: Not on file  Social Connections: Not on file  Intimate Partner Violence: Not At Risk (09/25/2022)   Humiliation, Afraid, Rape, and Kick questionnaire    Fear of Current or Ex-Partner: No    Emotionally Abused: No    Physically Abused: No    Sexually Abused: No      Family History  Problem Relation Age of Onset   Stroke Father    Diabetes Maternal Grandmother     PHYSICAL EXAM: There were no vitals filed for this visit. GENERAL: Well nourished, well developed, and in no apparent distress at rest.  HEENT: Negative for arcus senilis or xanthelasma. There is  no scleral icterus.  The mucous membranes are pink and moist.   NECK: Supple, No masses. Normal carotid upstrokes without bruits. No masses or thyromegaly.    CHEST: There are no chest wall deformities. There is no chest wall tenderness. Respirations are unlabored.  Lungs- *** CARDIAC:  JVP: *** cm          Normal rate with regular rhythm. No murmurs, rubs or gallops.  Pulses are 2+ and symmetrical in upper and lower extremities. *** edema.  ABDOMEN: Soft, non-tender, non-distended. There are no masses or hepatomegaly. There are normal bowel sounds.  EXTREMITIES: Warm and well perfused with no cyanosis, clubbing.  LYMPHATIC: No axillary or supraclavicular lymphadenopathy.  NEUROLOGIC:  Patient is oriented x3 with no focal or lateralizing neurologic deficits.  PSYCH: Patients affect is appropriate, there is no evidence of anxiety or depression.  SKIN: Warm and dry; no lesions or wounds.    DATA REVIEW  ECG: NSR  As per my personal interpretation  ECHO: 09/25/2022: LVEF 30 to 35%, moderately reduced RV function. 02/27/2022: LVEF 35 to 40%, grade 3 diastolic dysfunction 09/20/2020: LVEF 50 to 55% 3/16//18: LVEF 20% 06/28/15: LVEF 40-45%  CMR 06/27/2008:  1.  Moderate LV systolic dysfunction with EF 38%.  The inferior and  inferolateral basilar wall appear segmentally worse.   2.  Very small area of mid-wall delayed enhancement in the  inferolateral base. This is nonspecific and can be seen with  infiltrative diseases such as sarcoid as well as with myocarditis.   CATH: 05/08/16:  RA = 3 RV = 34/5 PA = 38/12 (21) PCW = 12 Fick cardiac output/index = 4.6/2.6 PVR = 1.9 WU Ao sat = 96% PA sat = 58%, 62%  12/20/15:  Mild to moderate, nonobstructive coronary artery disease, including 15% distal LAD stenosis, 30% ulcerated mid LCx disease, and 40% mid RCA stenosis. Upper normal left ventricular filling pressure (LVEDP 15 mmHg)    ASSESSMENT & PLAN:  Heart failure reduced ejection  fraction Etiology of HF: Nonischemic cardiomyopathy, dating back to 2010. NYHA class / AHA Stage:iib Volume status & Diuretics: mildly hypervolemic; taking bumex 1mg  daily; take extra bumex today. Discussed daily weights with her.  Vasodilators:Entresto 24/26mg ; will increase at follow up.  Beta-Blocker:coreg 3.125mg  BID MRA: spironolactone 12.5mg  daily Cardiometabolic:will start SGLT2i today Devices therapies & Valvulopathies:not indicated  Advanced therapies:not indicated  2.  Hypertension -Hypertensive today.  Reports that her blood pressure is higher than usual.  Will schedule pharmacy follow-up in 3 weeks with plan to uptitrate Entresto at that time.  Starting SGLT2 inhibitor today.  3.  Type 2 diabetes -Discussed importance of strict diabetic control due to elevated A1c.  Start SGLT2 inhibitor today.  4.  Peripheral arterial disease -Reports having some pain in her legs after walking more than 30 minutes however ABIs only mild to moderate PAD.  Reviewed by Dr. Gery Pray and at this time no need for intervention.  5.  CKD stage III -Repeat lab work today.  Starting SGLT2 inhibitor for CKD HFrEF and type 2 diabetes.  6.  Hyperlipidemia - lipitor 40mg  daily, no myalgias.  7.  Breast cancer status postlumpectomy/radiation -In remission.   Zarin Hagmann Advanced Heart Failure Mechanical Circulatory Support

## 2023-01-28 NOTE — Progress Notes (Unsigned)
  Cardiology Office Note:  .   Date:  01/29/2023  ID:  [name], DOB 1945-11-28, MRN [id number] PCP: Darrow Bussing, MD  [facility] Providers Cardiologist: Bryan Lemma, MD }   History of Present Illness: .   [name] is a 77 y.o. female HFrEF, (eF of 30-35% per echo 11/10/2022), PAD, tobacco abuse, diabetes, hyperlipidemia, CAD, chronic kidney disease. She was referred to Advanced Heart Failure. See by Dr. Gasper Lloyd on 12/04/2022. She was started on SGLT2 inhibitor for CKD, continued on Entresto   Since being seen last she has run out of samples of Jardiance.  She was to have a follow-up with advanced heart failure clinic and they were helping her receive her Jardiance at reduced cost.  She has not heard back from them yet.  Secondly she has stopped taking her spironolactone over the last week because she believes it was causing diarrhea.  On further questioning it turns out that she has been eating a lot of sausage and biscuits and the sausages upset her stomach.  She states that she has gained a few pounds at home and is noticing some lower extremity edema.  ROS: As above otherwise negative.  Studies Reviewed: Marland Kitchen        Physical Exam:   VS:  BP 132/86   Pulse 75   Ht 5' 3.5" (1.613 m)   Wt 125 lb 9.6 oz (57 kg)   SpO2 98%   BMI 21.90 kg/m    Wt Readings from Last 3 Encounters:  01/29/23 125 lb 9.6 oz (57 kg)  12/04/22 129 lb (58.5 kg)  11/10/22 125 lb (56.7 kg)    GEN: Well nourished, well developed in no acute distress NECK: No JVD; No carotid bruits CARDIAC: RRR, no murmurs, rubs, gallops RESPIRATORY:  Clear to auscultation without rales, wheezing or rhonchi  ABDOMEN: Soft, non-tender, non-distended EXTREMITIES:  1 + dependent edema; No deformity   ASSESSMENT AND PLAN: .    Chronic systolic heart failure: Is now being followed in advanced heart failure clinic.  She has run out of her Jardiance for over a month, and has not been taking spironolactone for 1  week.  She believes diarrhea is being caused by the spironolactone.  On further questioning she has been eating greasy sausage which is upset her stomach.  She is advised to go back on the spironolactone 12.5 mg daily.  Paperwork has been filled out to request reduced cost on Entresto and this is being faxed today.  We do not have any samples of Jardiance today.  She is to call the heart failure clinic and ask about her status concerning Jardiance assistance.  She will have a BMET drawn today in the setting of diarrhea and known chronic kidney disease with not taking Jardiance or spironolactone.  2.  Nonischemic cardiomyopathy: Continue carvedilol 3.125 mg twice daily.  Refills are provided for her.  3.  Hypercholesterolemia: Goal of LDL less than 70.  She remains on atorvastatin 40 mg daily.  Follow-up labs could be done on the next office visit but they should be fasting.  4.  Insulin-dependent diabetes: Followed by primary care.  5.  Dietary noncompliance: She has been eating some salty greasy foods.  I have advised her on a low-sodium diet and she has been given a salty 6 reminder.         Signed, Bettey Mare. Liborio Nixon, ANP, AACC

## 2023-01-29 ENCOUNTER — Encounter (HOSPITAL_COMMUNITY): Payer: Medicare Other | Admitting: Cardiology

## 2023-01-29 ENCOUNTER — Ambulatory Visit: Payer: Medicare Other | Attending: Adult Health | Admitting: Adult Health

## 2023-01-29 ENCOUNTER — Encounter: Payer: Self-pay | Admitting: Adult Health

## 2023-01-29 VITALS — BP 132/86 | HR 75 | Ht 63.5 in | Wt 125.6 lb

## 2023-01-29 DIAGNOSIS — I5022 Chronic systolic (congestive) heart failure: Secondary | ICD-10-CM | POA: Diagnosis not present

## 2023-01-29 DIAGNOSIS — I10 Essential (primary) hypertension: Secondary | ICD-10-CM | POA: Diagnosis present

## 2023-01-29 DIAGNOSIS — I43 Cardiomyopathy in diseases classified elsewhere: Secondary | ICD-10-CM

## 2023-01-29 DIAGNOSIS — E7800 Pure hypercholesterolemia, unspecified: Secondary | ICD-10-CM | POA: Diagnosis not present

## 2023-01-29 DIAGNOSIS — R609 Edema, unspecified: Secondary | ICD-10-CM

## 2023-01-29 DIAGNOSIS — N1832 Chronic kidney disease, stage 3b: Secondary | ICD-10-CM | POA: Diagnosis present

## 2023-01-29 MED ORDER — SPIRONOLACTONE 25 MG PO TABS: 12.5000 mg | ORAL_TABLET | Freq: Every day | ORAL | 3 refills | Status: DC

## 2023-01-29 MED ORDER — CARVEDILOL 3.125 MG PO TABS: 3.1250 mg | ORAL_TABLET | Freq: Two times a day (BID) | ORAL | 1 refills | Status: DC

## 2023-01-29 NOTE — Patient Instructions (Signed)
Medication Instructions:  NO Changes *If you need a refill on your cardiac medications before your next appointment, please call your pharmacy*   Lab Work: No Labs If you have labs (blood work) drawn today and your tests are completely normal, you will receive your results only by: MyChart Message (if you have MyChart) OR A paper copy in the mail If you have any lab test that is abnormal or we need to change your treatment, we will call you to review the results.   Testing/Procedures: No Testing   Follow-Up: At [facility], you and your health needs are our priority.  As part of our continuing mission to provide you with exceptional heart care, we have created designated Provider Care Teams.  These Care Teams include your primary Cardiologist (physician) and Advanced Practice Providers (APPs -  Physician Assistants and Nurse Practitioners) who all work together to provide you with the care you need, when you need it.  We recommend signing up for the patient portal called "MyChart".  Sign up information is provided on this After Visit Summary.  MyChart is used to connect with patients for Virtual Visits (Telemedicine).  Patients are able to view lab/test results, encounter notes, upcoming appointments, etc.  Non-urgent messages can be sent to your provider as well.   To learn more about what you can do with MyChart, go to [email or web address].    Your next appointment:   6 month(s)  Provider:   Bryan Lemma, MD

## 2023-01-30 LAB — BASIC METABOLIC PANEL
BUN/Creatinine Ratio: 29 — ABNORMAL HIGH (ref 12–28)
BUN: 50 mg/dL — ABNORMAL HIGH (ref 8–27)
CO2: 21 mmol/L (ref 20–29)
Calcium: 9.2 mg/dL (ref 8.7–10.3)
Chloride: 106 mmol/L (ref 96–106)
Creatinine, Ser: 1.75 mg/dL — ABNORMAL HIGH (ref 0.57–1.00)
Glucose: 173 mg/dL — ABNORMAL HIGH (ref 70–99)
Potassium: 5.5 mmol/L — ABNORMAL HIGH (ref 3.5–5.2)
Sodium: 142 mmol/L (ref 134–144)
eGFR: 29 mL/min/{1.73_m2} — ABNORMAL LOW (ref >59)

## 2023-02-01 ENCOUNTER — Other Ambulatory Visit (HOSPITAL_COMMUNITY): Payer: Self-pay

## 2023-02-01 ENCOUNTER — Telehealth (HOSPITAL_COMMUNITY): Payer: Self-pay | Admitting: Pharmacy Technician

## 2023-02-01 NOTE — Telephone Encounter (Signed)
Advanced Heart Failure Patient Advocate Encounter  Patient left vm asking for assistance with Jardiance. Called and left the patient a message to return my call.   Archer Asa, CPhT

## 2023-02-02 NOTE — Telephone Encounter (Signed)
Advanced Heart Failure Patient Advocate Encounter  Spoke with patient. Will start [employer] application and provide samples.

## 2023-02-08 ENCOUNTER — Telehealth: Payer: Self-pay

## 2023-02-08 NOTE — Telephone Encounter (Addendum)
Called patient regarding results. Unable to leave message. ----- Message from Joni Reining sent at 01/30/2023 11:08 AM EST ----- Kidney function has decreased some on the higher dose of Bumex.  Would go down on the dose to once a day. Should check back in with the Advanced Heart Failure clinic about the Jardiance as this will help with kidney function. No other changes to the medication regimen.

## 2023-02-20 ENCOUNTER — Telehealth: Payer: Self-pay

## 2023-02-20 NOTE — Telephone Encounter (Addendum)
 Called patient regarding results. Patient had understanding of results.----- Message from Lamarr Satterfield sent at 01/30/2023 11:08 AM EST ----- Kidney function has decreased some on the higher dose of Bumex .  Would go down on the dose to once a day. Should check back in with the Advanced Heart Failure clinic about the Jardiance  as this will help with kidney function. No other changes to the medication regimen.

## 2023-02-22 ENCOUNTER — Telehealth: Payer: Self-pay

## 2023-02-22 NOTE — Telephone Encounter (Signed)
 Called patient regarding patient assistance denial. Left message for patient to call office.

## 2023-02-23 ENCOUNTER — Encounter (HOSPITAL_COMMUNITY): Payer: Medicare Other | Admitting: Cardiology

## 2023-02-25 ENCOUNTER — Other Ambulatory Visit: Payer: Self-pay | Admitting: Adult Health

## 2023-03-13 NOTE — Telephone Encounter (Signed)
Advanced Heart Failure Patient Advocate Encounter  Called and left the patient a message regarding Jardiance application. Have not received a return copy.

## 2023-03-15 ENCOUNTER — Telehealth (HOSPITAL_COMMUNITY): Payer: Self-pay | Admitting: Licensed Clinical Social Worker

## 2023-03-15 NOTE — Telephone Encounter (Signed)
CSW called pt to inquire if she still needed samples left at front desk and to remind of patient assistance application awaiting signature- left VM for pt and pt dtr.  Burna Sis, LCSW Clinical Social Worker Advanced [facility] Failure Clinic Desk#: [phone] Cell#: [phone]

## 2023-03-16 ENCOUNTER — Telehealth (HOSPITAL_COMMUNITY): Payer: Self-pay | Admitting: Licensed Clinical Social Worker

## 2023-03-16 ENCOUNTER — Other Ambulatory Visit (HOSPITAL_COMMUNITY): Payer: Self-pay

## 2023-03-16 NOTE — Telephone Encounter (Signed)
H&V Care Navigation CSW Progress Note  Clinical Social Worker received return call from dtr stating she can come by now for samples and to get applications for patient assistance.  CSW handed off samples to daughter and provided with application for [employer]- she will have her mom sign and plan to bring back Monday to clinic.  SDOH Screenings   Food Insecurity: Patient Declined (09/25/2022)  Housing: Patient Declined (09/25/2022)  Transportation Needs: No Transportation Needs (09/25/2022)  Utilities: Patient Declined (09/25/2022)  Alcohol Screen: Low Risk  (09/25/2022)  Financial Resource Strain: Low Risk  (09/25/2022)  Tobacco Use: Medium Risk (01/29/2023)    Burna Sis, LCSW Clinical Social Worker [facility] Clinic Desk#: [phone] Cell#: [phone]

## 2023-03-19 NOTE — Telephone Encounter (Signed)
Advanced Heart Failure Patient Advocate Encounter  Patients daughter came to pick up application on Friday, 01/24. Will fax in once received.

## 2023-03-20 ENCOUNTER — Telehealth: Payer: Self-pay | Admitting: Pharmacist

## 2023-03-20 NOTE — Telephone Encounter (Signed)
Advanced Heart Failure Patient Advocate Encounter  Sent in application via fax.  Will follow up.

## 2023-03-20 NOTE — Progress Notes (Signed)
   03/20/2023  Patient ID: [name], female   DOB: 08/18/1945, 78 y.o.   MRN: [id number]  [employer] Scheduling:   Tried calling patient today to schedule free DM initial visit based on A1c reading with me as the pharmacist. Left HIPAA compliant voicemail requesting call back at her earliest convenience to schedule. If patient returns call to office, you can transfer her to my line at [phone]. Thanks!   Marlowe Aschoff, PharmD [facility] Phone Number: [phone]

## 2023-04-09 ENCOUNTER — Ambulatory Visit (HOSPITAL_COMMUNITY)
Admission: RE | Admit: 2023-04-09 | Discharge: 2023-04-09 | Disposition: A | Discharge status: Home / Self Care | Payer: Medicare Other | Source: Ambulatory Visit | Attending: Cardiology | Admitting: Cardiology

## 2023-04-09 ENCOUNTER — Encounter (HOSPITAL_COMMUNITY): Payer: Self-pay | Admitting: Cardiology

## 2023-04-09 VITALS — BP 150/80 | HR 68 | Wt 117.6 lb

## 2023-04-09 DIAGNOSIS — E1122 Type 2 diabetes mellitus with diabetic chronic kidney disease: Secondary | ICD-10-CM | POA: Diagnosis not present

## 2023-04-09 DIAGNOSIS — N1832 Chronic kidney disease, stage 3b: Secondary | ICD-10-CM

## 2023-04-09 DIAGNOSIS — E785 Hyperlipidemia, unspecified: Secondary | ICD-10-CM | POA: Diagnosis not present

## 2023-04-09 DIAGNOSIS — N183 Chronic kidney disease, stage 3 unspecified: Secondary | ICD-10-CM | POA: Diagnosis not present

## 2023-04-09 DIAGNOSIS — I130 Hypertensive heart and chronic kidney disease with heart failure and stage 1 through stage 4 chronic kidney disease, or unspecified chronic kidney disease: Secondary | ICD-10-CM | POA: Insufficient documentation

## 2023-04-09 DIAGNOSIS — I10 Essential (primary) hypertension: Secondary | ICD-10-CM

## 2023-04-09 DIAGNOSIS — I5022 Chronic systolic (congestive) heart failure: Secondary | ICD-10-CM | POA: Diagnosis present

## 2023-04-09 DIAGNOSIS — I428 Other cardiomyopathies: Secondary | ICD-10-CM | POA: Diagnosis not present

## 2023-04-09 DIAGNOSIS — H353290 Exudative age-related macular degeneration, unspecified eye, stage unspecified: Secondary | ICD-10-CM | POA: Insufficient documentation

## 2023-04-09 DIAGNOSIS — I2510 Atherosclerotic heart disease of native coronary artery without angina pectoris: Secondary | ICD-10-CM | POA: Diagnosis not present

## 2023-04-09 DIAGNOSIS — Z794 Long term (current) use of insulin: Secondary | ICD-10-CM | POA: Diagnosis not present

## 2023-04-09 DIAGNOSIS — Z7984 Long term (current) use of oral hypoglycemic drugs: Secondary | ICD-10-CM | POA: Insufficient documentation

## 2023-04-09 DIAGNOSIS — Z853 Personal history of malignant neoplasm of breast: Secondary | ICD-10-CM | POA: Diagnosis not present

## 2023-04-09 DIAGNOSIS — E1151 Type 2 diabetes mellitus with diabetic peripheral angiopathy without gangrene: Secondary | ICD-10-CM | POA: Diagnosis not present

## 2023-04-09 DIAGNOSIS — F17210 Nicotine dependence, cigarettes, uncomplicated: Secondary | ICD-10-CM | POA: Diagnosis not present

## 2023-04-09 DIAGNOSIS — Z79899 Other long term (current) drug therapy: Secondary | ICD-10-CM | POA: Diagnosis not present

## 2023-04-09 LAB — BASIC METABOLIC PANEL
Anion gap: 9 (ref 5–15)
BUN: 40 mg/dL — ABNORMAL HIGH (ref 8–23)
CO2: 27 mmol/L (ref 22–32)
Calcium: 9.6 mg/dL (ref 8.9–10.3)
Chloride: 103 mmol/L (ref 98–111)
Creatinine, Ser: 1.71 mg/dL — ABNORMAL HIGH (ref 0.44–1.00)
GFR, Estimated: 30 mL/min — ABNORMAL LOW (ref >60)
Glucose, Bld: 205 mg/dL — ABNORMAL HIGH (ref 70–99)
Potassium: 4.3 mmol/L (ref 3.5–5.1)
Sodium: 139 mmol/L (ref 135–145)

## 2023-04-09 LAB — BRAIN NATRIURETIC PEPTIDE: B Natriuretic Peptide: 3400.3 pg/mL — ABNORMAL HIGH (ref 0.0–100.0)

## 2023-04-09 MED ORDER — SACUBITRIL-VALSARTAN 49-51 MG PO TABS: 1.0000 | ORAL_TABLET | Freq: Two times a day (BID) | ORAL | 3 refills | Status: DC

## 2023-04-09 MED ORDER — EMPAGLIFLOZIN 10 MG PO TABS: 10.0000 mg | ORAL_TABLET | Freq: Every day | ORAL | 11 refills | Status: DC

## 2023-04-09 NOTE — Progress Notes (Signed)
 ADVANCED HEART FAILURE CLINIC NOTE  Referring Physician: Darrow Bussing, MD  Primary Care: Ashley Bussing, MD Primary Cardiologist: Dr. Herbie Cardenas  CC: Systolic heart failure  HPI: [name] is a 78 y.o. female with peripheral arterial disease, tobacco use, type 2 diabetes, hyperlipidemia, coronary artery disease, CKD, breast cancer status postlumpectomy/radiation and chemotherapy (tamoxifen) and heart failure with reduced ejection fraction presenting today to establish care.  Her cardiac history dates back to at least May 2010 when she had a cardiac MRI with a EF of 35 to 40% and inferior/inferior lateral wall hypokinesis.  Follow-up echocardiogram in 2013 with improvement in EF to 55%.October 2017 left heart cath demonstrated mild to moderate nonobstructive CAD.  Her LV function remained mostly stable until March 2018 when she had an echo with EF of 20% and diffuse hypokinesis.  Right heart catheterization at that time with preserved cardiac index and low filling pressures.  She was admitted in January 2024 for decompensated heart failure during which time EF dropped to 35% with global hypokinesis and grade 3 diastolic dysfunction.  She was diuresed and started on GDMT.  She was once again admitted in 09/25/2022 for decompensated heart failure during which time she was once more diuresed started on GDMT and discharged home.  Since that time she has followed with Dr. Herbie Cardenas and presents today to establish care.  Interval hx:  - Reports that her heart failure symptoms have improved significantly since starting GDMT.  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   Activity level/exercise tolerance:  NYHA IIB Orthopnea:  Sleeps on 2 pillows Paroxysmal noctural dyspnea:  No Chest pain/pressure:  No Orthostatic lightheadedness:  No Palpitations:  No Lower extremity edema:  No Presyncope/syncope:  No Cough:  No  Current Outpatient Medications   Medication Sig Dispense Refill   atorvastatin (LIPITOR) 40 MG tablet Take 1 tablet by mouth once daily 90 tablet 3   bumetanide (BUMEX) 1 MG tablet Take 1 tablet by mouth once daily 90 tablet 3   carvedilol (COREG) 3.125 MG tablet Take 1 tablet (3.125 mg total) by mouth 2 (two) times daily with a meal. 180 tablet 1   Continuous Glucose Sensor (FREESTYLE LIBRE 3 PLUS SENSOR) MISC as directed for 30 days e11.69     Continuous Glucose Sensor (FREESTYLE LIBRE 3 SENSOR) MISC See admin instructions.     empagliflozin (JARDIANCE) 10 MG TABS tablet Take 1 tablet (10 mg total) by mouth daily before breakfast.     insulin NPH-regular Human (70-30) 100 UNIT/ML injection Inject 10 Units into the skin in the morning.     sacubitril-valsartan (ENTRESTO) 24-26 MG Take 1 tablet by mouth 2 (two) times daily. 28 tablet 0   spironolactone (ALDACTONE) 25 MG tablet Take 0.5 tablets (12.5 mg total) by mouth daily. 90 tablet 3   No current facility-administered medications for this encounter.   PHYSICAL EXAM: Vitals:   04/09/23 1358  BP: (!) 150/80  Pulse: 68  SpO2: 95%   GENERAL: NAD Lungs- CTA  CARDIAC:  JVP: 5 cm          Normal rate with regular rhythm.  Pulses 1+. no edema.  ABDOMEN: Soft, non-tender, non-distended.  EXTREMITIES: Warm and well perfused.  NEUROLOGIC: No obvious FND   DATA REVIEW  ECG: NSR  As per my personal interpretation  ECHO: 09/25/2022: LVEF 30 to 35%, moderately reduced RV function. 02/27/2022: LVEF 35 to 40%, grade 3 diastolic dysfunction 09/20/2020: LVEF 50 to 55%  3/16//18: LVEF 20% 06/28/15: LVEF 40-45%  CMR 06/27/2008:  1.  Moderate LV systolic dysfunction with EF 38%.  The inferior and  inferolateral basilar wall appear segmentally worse.   2.  Very small area of mid-wall delayed enhancement in the  inferolateral base. This is nonspecific and can be seen with  infiltrative diseases such as sarcoid as well as with myocarditis.   CATH: 05/08/16:  RA = 3 RV = 34/5 PA  = 38/12 (21) PCW = 12 Fick cardiac output/index = 4.6/2.6 PVR = 1.9 WU Ao sat = 96% PA sat = 58%, 62%  12/20/15:  Mild to moderate, nonobstructive coronary artery disease, including 15% distal LAD stenosis, 30% ulcerated mid LCx disease, and 40% mid RCA stenosis. Upper normal left ventricular filling pressure (LVEDP 15 mmHg)    ASSESSMENT & PLAN:  Heart failure reduced ejection fraction Etiology of HF: Nonischemic cardiomyopathy, dating back to 2010. NYHA class / AHA Stage:iib Volume status & Diuretics: euvolemic on exam; discussed importance of daily weights. Currently taking bumex 1 BID; BMP/BNP today.  Vasodilators:Entresto 24/26mg ; increase to 49/51mg  BID today Beta-Blocker:coreg 3.125mg  BID; limited by HR, will increase at follow up MRA: spironolactone 12.5mg  daily Cardiometabolic: continue jardiance 10mg  daily Devices therapies & Valvulopathies:not indicated  Advanced therapies:not indicated  2.  Hypertension -Increase entresto to 49/51mg  BID - repeat BMP/BNP  3.  Type 2 diabetes -continue jardiance -discussed importance of controlling her T2DM.   4.  Peripheral arterial disease -Reports having some pain in her legs after walking more than 30 minutes however ABIs only mild to moderate PAD.  Reviewed by Dr. Gery Cardenas and at this time no need for intervention.  5.  CKD stage III -Now on jardiance 10mg  daily - repeat BMP  6.  Hyperlipidemia - lipitor 40mg  daily, no myalgias.  7.  Breast cancer status postlumpectomy/radiation -In remission.  8. Wet macular degeneration - planning to see ophthalmology today for intervention  [name] Advanced Heart Failure Mechanical Circulatory Support

## 2023-04-09 NOTE — Patient Instructions (Signed)
 Great to see you today!!!  Medication Changes:  INCREASE Entresto to 49/51 mg Twice daily   Lab Work:  Labs done today, your results will be available in MyChart, we will contact you for abnormal readings.   Testing/Procedures:  Your physician has requested that you have an echocardiogram. Echocardiography is a painless test that uses sound waves to create images of your heart. It provides your doctor with information about the size and shape of your heart and how well your heart's chambers and valves are working. This procedure takes approximately one hour. There are no restrictions for this procedure. Please do NOT wear cologne, perfume, aftershave, or lotions (deodorant is allowed). Please arrive 15 minutes prior to your appointment time.  Please note: We ask at that you not bring children with you during ultrasound (echo/ vascular) testing. Due to room size and safety concerns, children are not allowed in the ultrasound rooms during exams. Our front office staff cannot provide observation of children in our lobby area while testing is being conducted. An adult accompanying a patient to their appointment will only be allowed in the ultrasound room at the discretion of the ultrasound technician under special circumstances. We apologize for any inconvenience.   Special Instructions // Education:  Do the following things EVERYDAY: Weigh yourself in the morning before breakfast. Write it down and keep it in a log. Take your medicines as prescribed Eat low salt foods--Limit salt (sodium) to 2000 mg per day.  Stay as active as you can everyday Limit all fluids for the day to less than 2 liters   Follow-Up in: 6-8 weeks with an echocardiogram    At the Advanced Heart Failure Clinic, you and your health needs are our priority. We have a designated team specialized in the treatment of Heart Failure. This Care Team includes your primary Heart Failure Specialized Cardiologist (physician),  Advanced Practice Providers (APPs- Physician Assistants and Nurse Practitioners), and Pharmacist who all work together to provide you with the care you need, when you need it.   You may see any of the following providers on your designated Care Team at your next follow up:  Dr. Arvilla Meres Dr. Marca Ancona Dr. Dorthula Nettles Dr. Theresia Bough Tonye Becket, NP Robbie Lis, [place] [facility] [place], [place] Brynda Peon, NP [place], NP Karle Plumber, PharmD   Please be sure to bring in all your medications bottles to every appointment.   Need to Contact us:  If you have any questions or concerns before your next appointment please send [place] a message through [place] or call our office at [phone].    TO LEAVE A MESSAGE FOR THE NURSE SELECT OPTION 2, PLEASE LEAVE A MESSAGE INCLUDING: YOUR NAME DATE OF BIRTH CALL BACK NUMBER REASON FOR CALL**this is important as we prioritize the call backs  YOU WILL RECEIVE A CALL BACK THE SAME DAY AS LONG AS YOU CALL BEFORE 4:00 PM

## 2023-04-11 NOTE — Telephone Encounter (Signed)
 Advanced Heart Failure Patient Advocate Encounter  Called [employer] to check the status of the patient's application. Representative stated that the patient needed POI in order to proceed with processing the application. Called and spoke with the patients daughter. Will provide a month of samples in the meantime.   Will follow up.

## 2023-04-12 ENCOUNTER — Telehealth (HOSPITAL_COMMUNITY): Payer: Self-pay

## 2023-04-12 NOTE — Telephone Encounter (Signed)
 Advanced Heart Failure Patient Advocate Encounter  Medication Samples have been left at registration desk for patient pick up. Drug name: Jardiance 10 MG Qty: 4x 7 ct package LOT: [id number] Exp.: 01/2025 SIG: Take 1 tablet by mouth once daily   The patient has been instructed regarding the correct time, dose, and frequency of taking this medication, including desired effects and most common side effects.   Product verified by Karle Plumber.  Burnell Blanks, CPhT Rx Patient Advocate Phone: [phone]

## 2023-04-16 ENCOUNTER — Other Ambulatory Visit (HOSPITAL_COMMUNITY): Payer: Self-pay

## 2023-04-16 NOTE — Telephone Encounter (Signed)
 Proof of income faxed to [facility] 04/16/2023.

## 2023-04-16 NOTE — Telephone Encounter (Signed)
 Medication Samples have been provided to the patient.  Drug name: Jardiance  Strength: 10 mg        Qty: 4  LOT: [id number]  Exp.Date: 03/2024  Dosing instructions: Take 1 tablet daily  The patient has been instructed regarding the correct time, dose, and frequency of taking this medication, including desired effects and most common side effects.   [name] Ania Levay 1:29 PM 04/16/2023

## 2023-05-02 ENCOUNTER — Other Ambulatory Visit (HOSPITAL_COMMUNITY): Payer: Self-pay

## 2023-05-02 ENCOUNTER — Telehealth (HOSPITAL_COMMUNITY): Payer: Self-pay | Admitting: Licensed Clinical Social Worker

## 2023-05-02 NOTE — Telephone Encounter (Signed)
 H&V Care Navigation CSW Progress Note  Clinical Social Worker received VM from pt daughter regarding [employer] assistance with Sherryll Burger- VM stated that [employer] would not send out medication to them due to issue with the script.  CSW reviewed chart and [employer] app was submitted through [employer] office and not [facility].  CSW messaged patient advocate team who will have RN send in new script.  CSW attempted to call pt daughter back to inform of this and ensure there were no other issues- unable to reach- left VM requesting return call.   SDOH Screenings   Food Insecurity: Patient Declined (09/25/2022)  Housing: Patient Declined (09/25/2022)  Transportation Needs: No Transportation Needs (09/25/2022)  Utilities: Patient Declined (09/25/2022)  Alcohol Screen: Low Risk  (09/25/2022)  Financial Resource Strain: Low Risk  (09/25/2022)  Tobacco Use: Medium Risk (04/09/2023)   Burna Sis, LCSW Clinical Social Worker Advanced Heart Failure Clinic Desk#: [phone] Cell#: [phone]

## 2023-05-07 ENCOUNTER — Other Ambulatory Visit (HOSPITAL_COMMUNITY): Payer: Self-pay

## 2023-05-07 ENCOUNTER — Telehealth: Payer: Self-pay | Admitting: Cardiology

## 2023-05-07 MED ORDER — ENTRESTO 49-51 MG PO TABS
1.0000 | ORAL_TABLET | Freq: Two times a day (BID) | ORAL | 3 refills | Status: DC
Start: 2023-05-07 — End: 2023-05-09
  Filled 2023-05-07: qty 60, 30d supply, fill #0

## 2023-05-07 NOTE — Telephone Encounter (Signed)
*  STAT* If patient is at the pharmacy, call can be transferred to refill team.   1. Which medications need to be refilled? (please list name of each medication and dose if known)  sacubitril-valsartan (ENTRESTO) 49-51 MG  2. Which pharmacy/location (including street and city if local pharmacy) is medication to be sent to? [facility] [place], [place] - [address] Phone: [phone]  Fax: [phone]     3. Do they need a 30 day or 90 day supply? 90  Pt is out of medication and needs samples

## 2023-05-07 NOTE — Telephone Encounter (Signed)
 Contacted [employer] regarding proof of income that was submitted. Rep stated that they are not able to accept the Cost of Living Adjustment as POI. They will require 1040 or SSI Letter.  Left voicemail for patient.  Burnell Blanks, CPhT Rx Patient Advocate Phone: [phone]

## 2023-05-07 NOTE — Telephone Encounter (Signed)
 This is a CHF pt

## 2023-05-09 ENCOUNTER — Other Ambulatory Visit (HOSPITAL_COMMUNITY): Payer: Self-pay | Admitting: Cardiology

## 2023-05-09 ENCOUNTER — Telehealth (HOSPITAL_COMMUNITY): Payer: Self-pay | Admitting: Licensed Clinical Social Worker

## 2023-05-09 MED ORDER — ENTRESTO 49-51 MG PO TABS: 1.0000 | ORAL_TABLET | Freq: Two times a day (BID) | ORAL | 3 refills | Status: DC

## 2023-05-09 NOTE — Telephone Encounter (Signed)
 H&V Care Navigation CSW Progress Note  Clinical Social Worker received call from pt dtr stating that [facility] office was informed by [employer] that our physician would need to send in script.  CSW had patient advocate confirm this would be ok and had triage staff send in script.  Dtr to call [employer] back in 48 hours to check that it worked.    SDOH Screenings   Food Insecurity: Patient Declined (09/25/2022)  Housing: Patient Declined (09/25/2022)  Transportation Needs: No Transportation Needs (09/25/2022)  Utilities: Patient Declined (09/25/2022)  Alcohol Screen: Low Risk  (09/25/2022)  Financial Resource Strain: Low Risk  (09/25/2022)  Tobacco Use: Medium Risk (04/09/2023)   Burna Sis, LCSW Clinical Social Worker Advanced [facility] Failure Clinic Desk#: [phone] Cell#: [phone]

## 2023-05-21 ENCOUNTER — Telehealth (HOSPITAL_COMMUNITY): Payer: Self-pay | Admitting: Cardiology

## 2023-05-21 NOTE — Telephone Encounter (Signed)
 Front office left message to schedule f/u appt with echo for Dr. Gasper Lloyd.

## 2023-05-31 ENCOUNTER — Telehealth (HOSPITAL_COMMUNITY): Payer: Self-pay | Admitting: Cardiology

## 2023-05-31 NOTE — Telephone Encounter (Signed)
 Front office personnel called patient to schedule a 2 mo f/u appt with an echocardiogram for Dr. Gasper Lloyd. Patient did not answer the telephone so front office left patient a voicemail on mobile phone.

## 2023-08-19 ENCOUNTER — Other Ambulatory Visit: Payer: Self-pay | Admitting: Cardiology

## 2023-12-28 ENCOUNTER — Inpatient Hospital Stay (HOSPITAL_COMMUNITY)

## 2023-12-28 ENCOUNTER — Emergency Department (HOSPITAL_COMMUNITY)

## 2023-12-28 ENCOUNTER — Encounter (HOSPITAL_COMMUNITY): Payer: Self-pay

## 2023-12-28 ENCOUNTER — Other Ambulatory Visit: Payer: Self-pay

## 2023-12-28 ENCOUNTER — Inpatient Hospital Stay (HOSPITAL_COMMUNITY)
Admission: EM | Admit: 2023-12-28 | Discharge: 2024-01-05 | DRG: 242 | Disposition: A | Discharge status: Rehabilitation Facility | Attending: Internal Medicine | Admitting: Internal Medicine

## 2023-12-28 DIAGNOSIS — M25551 Pain in right hip: Secondary | ICD-10-CM

## 2023-12-28 DIAGNOSIS — I5022 Chronic systolic (congestive) heart failure: Secondary | ICD-10-CM | POA: Diagnosis not present

## 2023-12-28 DIAGNOSIS — R55 Syncope and collapse: Secondary | ICD-10-CM | POA: Diagnosis present

## 2023-12-28 DIAGNOSIS — I428 Other cardiomyopathies: Secondary | ICD-10-CM | POA: Diagnosis present

## 2023-12-28 DIAGNOSIS — R001 Bradycardia, unspecified: Secondary | ICD-10-CM | POA: Diagnosis present

## 2023-12-28 DIAGNOSIS — E7849 Other hyperlipidemia: Secondary | ICD-10-CM | POA: Diagnosis present

## 2023-12-28 DIAGNOSIS — E1151 Type 2 diabetes mellitus with diabetic peripheral angiopathy without gangrene: Secondary | ICD-10-CM | POA: Diagnosis present

## 2023-12-28 DIAGNOSIS — E875 Hyperkalemia: Secondary | ICD-10-CM | POA: Diagnosis present

## 2023-12-28 DIAGNOSIS — R739 Hyperglycemia, unspecified: Secondary | ICD-10-CM | POA: Diagnosis not present

## 2023-12-28 DIAGNOSIS — I5023 Acute on chronic systolic (congestive) heart failure: Secondary | ICD-10-CM | POA: Diagnosis present

## 2023-12-28 DIAGNOSIS — Z79899 Other long term (current) drug therapy: Secondary | ICD-10-CM

## 2023-12-28 DIAGNOSIS — R7401 Elevation of levels of liver transaminase levels: Secondary | ICD-10-CM | POA: Diagnosis present

## 2023-12-28 DIAGNOSIS — Z91048 Other nonmedicinal substance allergy status: Secondary | ICD-10-CM | POA: Diagnosis not present

## 2023-12-28 DIAGNOSIS — E1165 Type 2 diabetes mellitus with hyperglycemia: Secondary | ICD-10-CM | POA: Diagnosis present

## 2023-12-28 DIAGNOSIS — I2510 Atherosclerotic heart disease of native coronary artery without angina pectoris: Secondary | ICD-10-CM | POA: Diagnosis present

## 2023-12-28 DIAGNOSIS — E785 Hyperlipidemia, unspecified: Secondary | ICD-10-CM

## 2023-12-28 DIAGNOSIS — N179 Acute kidney failure, unspecified: Secondary | ICD-10-CM | POA: Diagnosis not present

## 2023-12-28 DIAGNOSIS — I959 Hypotension, unspecified: Secondary | ICD-10-CM | POA: Diagnosis not present

## 2023-12-28 DIAGNOSIS — I130 Hypertensive heart and chronic kidney disease with heart failure and stage 1 through stage 4 chronic kidney disease, or unspecified chronic kidney disease: Secondary | ICD-10-CM | POA: Diagnosis present

## 2023-12-28 DIAGNOSIS — Z7984 Long term (current) use of oral hypoglycemic drugs: Secondary | ICD-10-CM

## 2023-12-28 DIAGNOSIS — E1122 Type 2 diabetes mellitus with diabetic chronic kidney disease: Secondary | ICD-10-CM | POA: Diagnosis present

## 2023-12-28 DIAGNOSIS — Z789 Other specified health status: Secondary | ICD-10-CM | POA: Diagnosis not present

## 2023-12-28 DIAGNOSIS — E872 Acidosis, unspecified: Secondary | ICD-10-CM | POA: Diagnosis present

## 2023-12-28 DIAGNOSIS — N183 Chronic kidney disease, stage 3 unspecified: Secondary | ICD-10-CM | POA: Diagnosis not present

## 2023-12-28 DIAGNOSIS — I441 Atrioventricular block, second degree: Secondary | ICD-10-CM

## 2023-12-28 DIAGNOSIS — F54 Psychological and behavioral factors associated with disorders or diseases classified elsewhere: Secondary | ICD-10-CM | POA: Diagnosis not present

## 2023-12-28 DIAGNOSIS — M25552 Pain in left hip: Secondary | ICD-10-CM

## 2023-12-28 DIAGNOSIS — Z4789 Encounter for other orthopedic aftercare: Secondary | ICD-10-CM | POA: Diagnosis not present

## 2023-12-28 DIAGNOSIS — R110 Nausea: Secondary | ICD-10-CM

## 2023-12-28 DIAGNOSIS — I442 Atrioventricular block, complete: Principal | ICD-10-CM | POA: Diagnosis present

## 2023-12-28 DIAGNOSIS — Z794 Long term (current) use of insulin: Secondary | ICD-10-CM | POA: Diagnosis not present

## 2023-12-28 DIAGNOSIS — I5031 Acute diastolic (congestive) heart failure: Secondary | ICD-10-CM | POA: Diagnosis not present

## 2023-12-28 DIAGNOSIS — Z9221 Personal history of antineoplastic chemotherapy: Secondary | ICD-10-CM

## 2023-12-28 DIAGNOSIS — N1832 Chronic kidney disease, stage 3b: Secondary | ICD-10-CM | POA: Diagnosis present

## 2023-12-28 DIAGNOSIS — I10 Essential (primary) hypertension: Secondary | ICD-10-CM | POA: Diagnosis not present

## 2023-12-28 DIAGNOSIS — E1169 Type 2 diabetes mellitus with other specified complication: Secondary | ICD-10-CM | POA: Diagnosis present

## 2023-12-28 DIAGNOSIS — T465X5A Adverse effect of other antihypertensive drugs, initial encounter: Secondary | ICD-10-CM | POA: Diagnosis not present

## 2023-12-28 DIAGNOSIS — Z87891 Personal history of nicotine dependence: Secondary | ICD-10-CM | POA: Diagnosis not present

## 2023-12-28 DIAGNOSIS — K5901 Slow transit constipation: Secondary | ICD-10-CM | POA: Diagnosis not present

## 2023-12-28 DIAGNOSIS — S32591A Other specified fracture of right pubis, initial encounter for closed fracture: Secondary | ICD-10-CM | POA: Diagnosis present

## 2023-12-28 DIAGNOSIS — R5381 Other malaise: Secondary | ICD-10-CM | POA: Diagnosis not present

## 2023-12-28 DIAGNOSIS — Z888 Allergy status to other drugs, medicaments and biological substances status: Secondary | ICD-10-CM | POA: Diagnosis not present

## 2023-12-28 DIAGNOSIS — Z853 Personal history of malignant neoplasm of breast: Secondary | ICD-10-CM

## 2023-12-28 DIAGNOSIS — W19XXXA Unspecified fall, initial encounter: Secondary | ICD-10-CM | POA: Diagnosis present

## 2023-12-28 DIAGNOSIS — R197 Diarrhea, unspecified: Secondary | ICD-10-CM | POA: Diagnosis not present

## 2023-12-28 DIAGNOSIS — Z833 Family history of diabetes mellitus: Secondary | ICD-10-CM | POA: Diagnosis not present

## 2023-12-28 DIAGNOSIS — E8729 Other acidosis: Secondary | ICD-10-CM | POA: Diagnosis not present

## 2023-12-28 DIAGNOSIS — E119 Type 2 diabetes mellitus without complications: Secondary | ICD-10-CM | POA: Diagnosis not present

## 2023-12-28 LAB — BRAIN NATRIURETIC PEPTIDE: B Natriuretic Peptide: 2489.8 pg/mL — ABNORMAL HIGH (ref 0.0–100.0)

## 2023-12-28 LAB — COMPREHENSIVE METABOLIC PANEL WITH GFR
ALT: 229 U/L — ABNORMAL HIGH (ref 0–44)
AST: 186 U/L — ABNORMAL HIGH (ref 15–41)
Albumin: 3.7 g/dL (ref 3.5–5.0)
Alkaline Phosphatase: 116 U/L (ref 38–126)
Anion gap: 13 (ref 5–15)
BUN: 81 mg/dL — ABNORMAL HIGH (ref 8–23)
CO2: 17 mmol/L — ABNORMAL LOW (ref 22–32)
Calcium: 9.1 mg/dL (ref 8.9–10.3)
Chloride: 106 mmol/L (ref 98–111)
Creatinine, Ser: 2.56 mg/dL — ABNORMAL HIGH (ref 0.44–1.00)
GFR, Estimated: 18 mL/min — ABNORMAL LOW (ref >60)
Glucose, Bld: 177 mg/dL — ABNORMAL HIGH (ref 70–99)
Potassium: 5 mmol/L (ref 3.5–5.1)
Sodium: 136 mmol/L (ref 135–145)
Total Bilirubin: 0.8 mg/dL (ref 0.0–1.2)
Total Protein: 6.7 g/dL (ref 6.5–8.1)

## 2023-12-28 LAB — CBC WITH DIFFERENTIAL/PLATELET
Abs Immature Granulocytes: 0.08 K/uL — ABNORMAL HIGH (ref 0.00–0.07)
Basophils Absolute: 0 K/uL (ref 0.0–0.1)
Basophils Relative: 1 %
Eosinophils Absolute: 0 K/uL (ref 0.0–0.5)
Eosinophils Relative: 0 %
HCT: 31.2 % — ABNORMAL LOW (ref 36.0–46.0)
Hemoglobin: 10.3 g/dL — ABNORMAL LOW (ref 12.0–15.0)
Immature Granulocytes: 2 %
Lymphocytes Relative: 26 %
Lymphs Abs: 1.2 K/uL (ref 0.7–4.0)
MCH: 31.3 pg (ref 26.0–34.0)
MCHC: 33 g/dL (ref 30.0–36.0)
MCV: 94.8 fL (ref 80.0–100.0)
Monocytes Absolute: 0.4 K/uL (ref 0.1–1.0)
Monocytes Relative: 7 %
Neutro Abs: 3.1 K/uL (ref 1.7–7.7)
Neutrophils Relative %: 64 %
Platelets: 177 K/uL (ref 150–400)
RBC: 3.29 MIL/uL — ABNORMAL LOW (ref 3.87–5.11)
RDW: 13.6 % (ref 11.5–15.5)
WBC: 4.8 K/uL (ref 4.0–10.5)
nRBC: 0 % (ref 0.0–0.2)

## 2023-12-28 LAB — I-STAT CHEM 8, ED
BUN: 81 mg/dL — ABNORMAL HIGH (ref 8–23)
Calcium, Ion: 1.25 mmol/L (ref 1.15–1.40)
Chloride: 108 mmol/L (ref 98–111)
Creatinine, Ser: 2.8 mg/dL — ABNORMAL HIGH (ref 0.44–1.00)
Glucose, Bld: 172 mg/dL — ABNORMAL HIGH (ref 70–99)
HCT: 30 % — ABNORMAL LOW (ref 36.0–46.0)
Hemoglobin: 10.2 g/dL — ABNORMAL LOW (ref 12.0–15.0)
Potassium: 5.2 mmol/L — ABNORMAL HIGH (ref 3.5–5.1)
Sodium: 139 mmol/L (ref 135–145)
TCO2: 20 mmol/L — ABNORMAL LOW (ref 22–32)

## 2023-12-28 LAB — I-STAT CG4 LACTIC ACID, ED: Lactic Acid, Venous: 2 mmol/L (ref 0.5–1.9)

## 2023-12-28 LAB — TROPONIN I (HIGH SENSITIVITY)
Troponin I (High Sensitivity): 122 ng/L (ref <18)
Troponin I (High Sensitivity): 136 ng/L (ref <18)

## 2023-12-28 LAB — MAGNESIUM: Magnesium: 2.1 mg/dL (ref 1.7–2.4)

## 2023-12-28 MED ORDER — ONDANSETRON HCL 4 MG PO TABS: 4.0000 mg | ORAL_TABLET | Freq: Four times a day (QID) | ORAL | Status: DC | PRN

## 2023-12-28 MED ORDER — INSULIN ASPART 100 UNIT/ML IJ SOLN
0.0000 [IU] | Freq: Three times a day (TID) | INTRAMUSCULAR | Status: DC
  Administered 2023-12-29 – 2023-12-30 (×4): 1 [IU] via SUBCUTANEOUS
  Filled 2023-12-28 (×4): qty 1

## 2023-12-28 MED ORDER — ACETAMINOPHEN 325 MG PO TABS
650.0000 mg | ORAL_TABLET | Freq: Four times a day (QID) | ORAL | Status: DC | PRN
Start: 2023-12-28 — End: 2023-12-31
  Administered 2023-12-29 – 2023-12-30 (×2): 650 mg via ORAL
  Filled 2023-12-28 (×2): qty 2

## 2023-12-28 MED ORDER — ONDANSETRON HCL 4 MG/2ML IJ SOLN
4.0000 mg | Freq: Four times a day (QID) | INTRAMUSCULAR | Status: DC | PRN
  Administered 2023-12-29 – 2023-12-31 (×2): 4 mg via INTRAVENOUS
  Filled 2023-12-28 (×2): qty 2

## 2023-12-28 MED ORDER — ACETAMINOPHEN 650 MG RE SUPP: 650.0000 mg | Freq: Four times a day (QID) | RECTAL | Status: DC | PRN

## 2023-12-28 MED ORDER — HYDROMORPHONE HCL 1 MG/ML IJ SOLN: 0.5000 mg | INTRAMUSCULAR | Status: DC | PRN

## 2023-12-28 MED ORDER — BISACODYL 5 MG PO TBEC: 5.0000 mg | DELAYED_RELEASE_TABLET | Freq: Every day | ORAL | Status: DC | PRN

## 2023-12-28 MED ORDER — MORPHINE SULFATE (PF) 4 MG/ML IV SOLN
4.0000 mg | Freq: Once | INTRAVENOUS | Status: AC
  Administered 2023-12-28: 4 mg via INTRAVENOUS
  Filled 2023-12-28: qty 1

## 2023-12-28 NOTE — ED Triage Notes (Signed)
 Patient was BIB [facility] from home. Witnessed fall, near syncope witnessed by daughter.  Sudden onset of N/V/D at 4am after having chinese for dinner. Increased SHOB on exertion. Hit the back of her head on fridge. NO LOC. General malaise and weakness. 20 Ga LAC. Initial complaint right hip pain. Can ambulate stand and pivot.[place] enroute  164/90 HR 28-38 PMX CHF CAP 28 RR 30 CBG 244  1mg  Atropine LR

## 2023-12-28 NOTE — Consult Note (Signed)
 Cardiology Consultation   Patient ID: [name] MRN: [id number]; DOB: Jul 11, 1945  Admit date: 12/28/2023 Date of Consult: 12/28/2023  PCP:  Ashley Slater, MD   [facility] Providers Cardiologist:  Alm Clay, MD        Patient Profile: [name] is a 78 y.o. female with a hx of NICM, chronic systolic and diastolic heart failure, nonobstructive CAD, chronic kidney disease stage 3b who is being seen 12/28/2023 for the evaluation of bradycardia and near syncope at the request of Dr. Patt.  History of Present Illness: History provided by patient and daughter at bedside. Yesterday was patient's birthday, and they had restaurant takeout. She notes that overnight to early AM today, she had nausea and diarrhea, felt overall poorly,somewhat lightheaded. She has not eaten since last night and has had very little to drink. Patient called daughter and asked her to come over; when daughter arrived patient reported that she was feeling better. Patient noted that she had gone out to walk to the mailbox and back and felt more short of breath than usual, but she was otherwise not having chest pain or shortness of breath at rest.  Daughter was sitting at the table, patient was behind her, and when daughter turned around she saw patient fall and hit the fridge. No loss of consciousness. Patient noted that her hip and back hurt. When daughter tried to get her to her bed, she had difficulty. Given all of the above, EMS was called.  On arrival, EMS noted bradycardia in the 30s. Given atropine without improvement.  Labs still returning,summarized below.   Past Medical History:  Diagnosis Date   Breast cancer (HCC)    breast - left    Chronic diastolic CHF (congestive heart failure), NYHA class 2 (HCC)    a. cMRI 5/10: EF 38% // b. Echo 3/13/: EF 50-55, Gr 1 DD // c. Echo 8/14: EF 40, inf/inf-septal HK, Gr 1 DD, mild MR // d. Echo 5/17: EF 40-45, inf HK, Gr 1 DD, mild MR, mild LAE, reduced  RVSF, mild TR, PASP 33 // e. Echo 10/17: EF 40-45, Gr 2 DD, mild MR, severe LAE; => 09/2020 EF 50-55%, GrII DD.   Coronary artery disease, non-occlusive    a. nonobs by LHC in 2010 // b. Myoview  10/17: Lg infarct apex, distal ant and lat walls, no ischemia, EF 38; int risk  // c. LHC 10/17:  dLAD 15, pLCx 30, mRCA 40, LVEDP 15   Diabetes mellitus    NICM (nonischemic cardiomyopathy) (HCC)     Past Surgical History:  Procedure Laterality Date   APPENDECTOMY     BREAST LUMPECTOMY     lt breast   CARDIAC CATHETERIZATION N/A 12/20/2015   Procedure: Left Heart Cath and Coronary Angiography;  Surgeon: Lonni Hanson, MD:: Mild to moderate, nonobstructive coronary artery disease, including 15% distal LAD stenosis, 30% ulcerated mid LCx disease, and 40% mid RCA stenosis.  Upper normal left ventricular filling pressure (LVEDP 15 mmHg).   RIGHT HEART CATH N/A 05/08/2016   Procedure: Right Heart Cath;  Surgeon: Toribio JONELLE Fuel, MD;  Location: [facility];; RA = 3, RV = 34/5, PA = 38/12 (21), PCW = 12; Fick cardiac output/index = 4.6/2.6; PVR = 1.9 WU, Ao sat = 96%; PA sat = 58%, 62%    TRANSTHORACIC ECHOCARDIOGRAM  11/2015   a) 11/2015: EF 40 and 45%.  GRII DD; b) 04/2016: EF 20% with grade 2 diastolic dysfunction   TRANSTHORACIC ECHOCARDIOGRAM  10/2016   a) EF 55-60%.  Normal LV function.  Mild DD.[name]  Normal valve Fxn.; b) 09/2020: EF 50 to 55%.  Normal function.  No R WMA.  Mild LVH.  GR 2 DD with elevated LAP.-Mild LA dilation.  Moderately elevated PAP estimated RVSP 47 mmHg, and RAP 8 mmHg.   TRIGGER FINGER RELEASE Left 04/23/2017   Procedure: RELEASE TRIGGER FINGER TO LEFT FORTH FINGER;  Surgeon: Marcus Lung, MD;  Location: [facility];  Service: Plastics;  Laterality: Left;     Home Medications:  Prior to Admission medications   Medication Sig Start Date End Date Taking? Authorizing Provider  atorvastatin  (LIPITOR) 40 MG tablet Take 1 tablet by mouth once daily 08/21/23    Anner Alm ORN, MD  bumetanide  (BUMEX ) 1 MG tablet Take 1 tablet by mouth once daily 02/27/23   Jerilynn Lamarr HERO, NP  carvedilol  (COREG ) 3.125 MG tablet Take 1 tablet (3.125 mg total) by mouth 2 (two) times daily with a meal. 01/29/23   Jerilynn Lamarr HERO, NP  Continuous Glucose Sensor (FREESTYLE LIBRE 3 PLUS SENSOR) MISC as directed for 30 days e11.69 01/04/23   [provider]  Continuous Glucose Sensor (FREESTYLE LIBRE 3 SENSOR) MISC See admin instructions.    [provider]  empagliflozin  (JARDIANCE ) 10 MG TABS tablet Take 1 tablet (10 mg total) by mouth daily before breakfast. 04/09/23   Sabharwal, Aditya, DO  insulin  NPH-regular Human (70-30) 100 UNIT/ML injection Inject 10 Units into the skin in the morning.    [provider]  sacubitril -valsartan  (ENTRESTO ) 49-51 MG Take 1 tablet by mouth 2 (two) times daily. 05/09/23   Sabharwal, Aditya, DO  spironolactone  (ALDACTONE ) 25 MG tablet Take 0.5 tablets (12.5 mg total) by mouth daily. 01/29/23 04/08/24  Jerilynn Lamarr HERO, NP    Scheduled Meds:  Continuous Infusions:  PRN Meds:   Allergies:    Allergies  Allergen Reactions   Pregabalin     Other Reaction(s): feeling fatigue drunk feeling, falls   Tape Rash    Pt can tolerate the paper tape    Social History:   Social History   Socioeconomic History   Marital status: Widowed    Spouse name: Not on file   Number of children: 2   Years of education: Not on file   Highest education level: Not on file  Occupational History   Occupation: retired  Tobacco Use   Smoking status: Former    Current packs/day: 0.00    Types: Cigarettes    Start date: 09/30/1982    Quit date: 09/29/2012    Years since quitting: 11.2   Smokeless tobacco: Never  Vaping Use   Vaping status: Never Used  Substance and Sexual Activity   Alcohol use: No   Drug use: No   Sexual activity: Not on file  Other Topics Concern   Not on file  Social History Narrative   Not  on file   Social Drivers of Health   Financial Resource Strain: Low Risk  (09/25/2022)   Overall Financial Resource Strain (CARDIA)    Difficulty of Paying Living Expenses: Not very hard  Food Insecurity: Patient Declined (09/25/2022)   Hunger Vital Sign    Worried About Running Out of Food in the Last Year: Patient declined    Ran Out of Food in the Last Year: Patient declined  Transportation Needs: No Transportation Needs (09/25/2022)   PRAPARE - Administrator, Civil Service (Medical): No    Lack  of Transportation (Non-Medical): No  Physical Activity: Not on file  Stress: Not on file  Social Connections: Not on file  Intimate Partner Violence: Not At Risk (09/25/2022)   Humiliation, Afraid, Rape, and Kick questionnaire    Fear of Current or Ex-Partner: No    Emotionally Abused: No    Physically Abused: No    Sexually Abused: No    Family History:    Family History  Problem Relation Age of Onset   Stroke Father    Diabetes Maternal Grandmother      ROS:  Please see the history of present illness.  Constitutional: Negative for chills, fever, night sweats, unintentional weight loss  HENT: Negative for ear pain and hearing loss.   Eyes: Negative for loss of vision and eye pain.  Respiratory: Negative for cough, sputum, wheezing.   Cardiovascular: See HPI. Gastrointestinal: Negative for melena, and hematochezia. Positive for nausea, diarrhea Genitourinary: Negative for dysuria and hematuria.  Musculoskeletal: Negative for myalgias.  Skin: Negative for itching and rash.  Neurological: Negative for focal sensory changes and loss of consciousness.  Endo/Heme/Allergies: Does not bruise/bleed easily.   All other ROS reviewed and negative.     Physical Exam/Data: Vitals:   12/28/23 1845 12/28/23 1900 12/28/23 1915 12/28/23 1930  BP: (!) 163/56 (!) 170/62 (!) 168/58 (!) 158/56  Pulse: (!) 38 (!) 36 (!) 37 (!) 32  Resp: 17 17 12 15   Temp:      TempSrc:      SpO2: 99%  100% 98% 99%  Weight:      Height:       No intake or output data in the 24 hours ending 12/28/23 1953    12/28/2023    5:50 PM 04/09/2023    1:58 PM 01/29/2023    3:43 PM  Last 3 Weights  Weight (lbs) 125 lb 117 lb 9.6 oz 125 lb 9.6 oz  Weight (kg) 56.7 kg 53.343 kg 56.972 kg     Body mass index is 21.8 kg/m.  General:  Well nourished, well developed, in no acute distress HEENT: normal Neck: JVD flat Vascular: No carotid bruits; Distal pulses 2+ bilaterally Cardiac:  normal S1, S2; RRR; 1/6 systolic murmur Lungs:  clear to auscultation bilaterally, no wheezing, rhonchi or rales  Abd: soft, nontender, no hepatomegaly  Ext: no edema, warm, well perfused Musculoskeletal:  No deformities, BUE and BLE strength normal and equal Skin: warm and dry  Neuro:  CNs 2-12 intact, no focal abnormalities noted Psych:  Normal affect   EKG:  The EKG was personally reviewed and demonstrates:  poor baseline, ventricular rate in the 30s with P prior, likely 2:1 AV block Telemetry:  Telemetry was personally reviewed and demonstrates:  regular rhythm with sinus beats prior, on several strips appears this may be 2:1 AV block but rhythm not well visualized on other strips  Relevant CV Studies: Echo 09/25/22  1. Left ventricular ejection fraction, by estimation, is 30 to 35%. The  left ventricle has moderately decreased function. The left ventricle  demonstrates global hypokinesis. Left ventricular diastolic parameters are  indeterminate.   2. Right ventricular systolic function is moderately reduced. The right  ventricular size is moderately enlarged. There is moderately elevated  pulmonary artery systolic pressure.   3. Left atrial size was moderately dilated.   4. Right atrial size was moderately dilated.   5. The mitral valve is degenerative. Mild to moderate mitral valve  regurgitation.   6. The tricuspid valve is degenerative. Tricuspid valve  regurgitation is  severe.   7. The aortic valve is  normal in structure. Aortic valve regurgitation is  not visualized.   8. The inferior vena cava is dilated in size with <50% respiratory  variability, suggesting right atrial pressure of 15 mmHg.   9. Cannot exclude a small PFO.   Laboratory Data: High Sensitivity Troponin:   Recent Labs  Lab 12/28/23 1758  TROPONINIHS 122*     Chemistry Recent Labs  Lab 12/28/23 1758 12/28/23 1813  NA 136 139  K 5.0 5.2*  CL 106 108  CO2 17*  --   GLUCOSE 177* 172*  BUN 81* 81*  CREATININE 2.56* 2.80*  CALCIUM  9.1  --   MG 2.1  --   GFRNONAA 18*  --   ANIONGAP 13  --     Recent Labs  Lab 12/28/23 1758  PROT 6.7  ALBUMIN 3.7  AST 186*  ALT 229*  ALKPHOS 116  BILITOT 0.8   Lipids No results for input(s): CHOL, TRIG, HDL, LABVLDL, LDLCALC, CHOLHDL in the last 168 hours.  Hematology Recent Labs  Lab 12/28/23 1758 12/28/23 1813  WBC 4.8  --   RBC 3.29*  --   HGB 10.3* 10.2*  HCT 31.2* 30.0*  MCV 94.8  --   MCH 31.3  --   MCHC 33.0  --   RDW 13.6  --   PLT 177  --    Thyroid No results for input(s): TSH, FREET4 in the last 168 hours.  BNP Recent Labs  Lab 12/28/23 1758  BNP 2,489.8*    DDimer No results for input(s): DDIMER in the last 168 hours.  Radiology/Studies:  DG Chest Port 1 View Result Date: 12/28/2023 EXAM: 1 VIEW(S) XRAY OF THE CHEST 12/28/2023 06:55:00 PM COMPARISON: 10/09/2022 CLINICAL HISTORY: dizziness FINDINGS: LINES, TUBES AND DEVICES: Multiple overlying monitor wires. LUNGS AND PLEURA: Pulmonary vascular congestion. Reticulonodular opacities in the left mid and lower lung suspicious for infection. No pulmonary edema. No pleural effusion. No pneumothorax. HEART AND MEDIASTINUM: Mild cardiomegaly. BONES AND SOFT TISSUES: No acute osseous abnormality. IMPRESSION: 1. Left mid and lower lung reticulonodular opacities suspicious for infection. 2. Mild cardiomegaly with pulmonary vascular congestion. Electronically signed by: Norman Gatlin MD  12/28/2023 07:43 PM EST RP Workstation: [id number]     Assessment and Plan: Bradycardia Likely 2:1 AV block DOE, likely 2/2 chronotropic incompetence -suspect 2/2 beta blocker, though low dose -stop carvedilol , last dose this morning -discussed 48 hour washout with patient and family -allow blood pressure to remain elevated while bradycardic -ordered echo -she is hypertensive (appropriately) to compensate.  Warm on exam. No indication acutely for transvenous pacing. Allow permissive hypertension  Elevated troponin -denies any chest pain -history of nonobstructive CAD -suspect 2/2 demand, will be slow to clear given  AKI -would not start heparin  now, but if she develops chest pain, or echo significantly worse than prior, with rising hsTn, would need to consider heparin   Nausea,diarrhea,  near syncope acute kidney injury on chronic kidney disease stage 3b acute transaminase elevation -endorses poor PO since last night, had some diarrhea but no vomiting -lactate elevated -she is warm and dry on exam, ok for hydration -Cr 2.80 , baseline ~1.7 -she is warm, not hypotensive, if transaminases trend up may need viral hepatitis workup given concern for symptoms after eating restaurant food, defer to primary team -denies other infectious symptoms  Chronic systolic and diastolic heart failure -appears euvolemic to dry on exam -permissive hypertension -hold bumex , entresto ,spironolactone , jardiance  -stopping carvedilol   as above  Type II diabetes -per medicine team  Nonobstructive CAD PAD -holding statin due to abnormal LFTs  Risk Assessment/Risk Scores:       For questions or updates, please contact [facility] Please consult [email or web address] for contact info under   Signed, Shelda Bruckner, MD  12/28/2023 7:53 PM

## 2023-12-28 NOTE — H&P (Signed)
 History and Physical    Patient: [name] FMW:[id number] DOB: September 15, 1945 DOA: 12/28/2023 DOS: the patient was seen and examined on 12/28/2023 PCP: Regino Slater, MD  Patient coming from: Home  Chief Complaint:  Chief Complaint  Patient presents with   Loss of Consciousness   Bradycardia   HPI: [name] is a 78 y.o. female with medical history significant for hypertension, CKD with baseline creatinine of 1.8, nonischemic cardiomyopathy w EF=30%, hyperlipidemia ,and type 2 diabetes mellitus.  The patient was in her usual state of health yesterday.  It was her birthday and she says she had a wonderful day.  She got up about 3 AM to use the bathroom and noticed that she was extremely dizzy and nauseated trying to get to the bathroom.  Once she got on the commode she did not think she could get up.  The patient was with her son and she called him to come help her.  He did not actually need to throw up but she felt very weak and dizzy.  He helped her back to bed.  She took some Pepto-Bismol.  Later that day the patient called her daughter and told her that she was not doing well.  She continued to be dizzy and nauseated.  The daughter came over and the patient was talking to her and while standing up in the kitchen and the daughter says she passed out and went straight back.  She hit her head on the refrigerator. She was alert after the fall but she was in a lot of pain.  Her daughter called the ambulance. When the ambulance arrived they noticed that her heart rate was in the 30s.  She was given some atropine but it did not seem to have an effect.  The patient was brought into the emergency department.  Cardiology was called to evaluate the patient.  Her blood pressure was excellent the patient denied chest pain or shortness of breath. Cardiology has recommended that the hospitalist admit the patient and monitor while the carvedilol  washes out of her system.  No further interventions are  recommended at this time. The patient does have pacer pads on and will be admitted to a stepdown/progressive bed. At the time of my evaluation the patient's main complaint is right hip pain.    Review of Systems: As mentioned in the history of present illness. All other systems reviewed and are negative. Past Medical History:  Diagnosis Date   Breast cancer (HCC)    breast - left    Chronic diastolic CHF (congestive heart failure), NYHA class 2 (HCC)    a. cMRI 5/10: EF 38% // b. Echo 3/13/: EF 50-55, Gr 1 DD // c. Echo 8/14: EF 40, inf/inf-septal HK, Gr 1 DD, mild MR // d. Echo 5/17: EF 40-45, inf HK, Gr 1 DD, mild MR, mild LAE, reduced RVSF, mild TR, PASP 33 // e. Echo 10/17: EF 40-45, Gr 2 DD, mild MR, severe LAE; => 09/2020 EF 50-55%, GrII DD.   Coronary artery disease, non-occlusive    a. nonobs by LHC in 2010 // b. Myoview  10/17: Lg infarct apex, distal ant and lat walls, no ischemia, EF 38; int risk  // c. LHC 10/17:  dLAD 15, pLCx 30, mRCA 40, LVEDP 15   Diabetes mellitus    NICM (nonischemic cardiomyopathy) (HCC)    Past Surgical History:  Procedure Laterality Date   APPENDECTOMY     BREAST LUMPECTOMY     lt breast  CARDIAC CATHETERIZATION N/A 12/20/2015   Procedure: Left Heart Cath and Coronary Angiography;  Surgeon: Lonni Hanson, MD:: Mild to moderate, nonobstructive coronary artery disease, including 15% distal LAD stenosis, 30% ulcerated mid LCx disease, and 40% mid RCA stenosis.  Upper normal left ventricular filling pressure (LVEDP 15 mmHg).   RIGHT HEART CATH N/A 05/08/2016   Procedure: Right Heart Cath;  Surgeon: Toribio JONELLE Fuel, MD;  [facility]: [facility];; RA = 3, RV = 34/5, PA = 38/12 (21), PCW = 12; Fick cardiac output/index = 4.6/2.6; PVR = 1.9 WU, Ao sat = 96%; PA sat = 58%, 62%    TRANSTHORACIC ECHOCARDIOGRAM  11/2015   a) 11/2015: EF 40 and 45%.  GRII DD; b) 04/2016: EF 20% with grade 2 diastolic dysfunction   TRANSTHORACIC ECHOCARDIOGRAM  10/2016   a)  EF 55-60%.  Normal LV function.  Mild DD.[name]  Normal valve Fxn.; b) 09/2020: EF 50 to 55%.  Normal function.  No R WMA.  Mild LVH.  GR 2 DD with elevated LAP.-Mild LA dilation.  Moderately elevated PAP estimated RVSP 47 mmHg, and RAP 8 mmHg.   TRIGGER FINGER RELEASE Left 04/23/2017   Procedure: RELEASE TRIGGER FINGER TO LEFT FORTH FINGER;  Surgeon: Marcus Lung, MD;  Location: [facility];  Service: Plastics;  Laterality: Left;   Social History:  reports that she quit smoking about 11 years ago. Her smoking use included cigarettes. She started smoking about 41 years ago. She has never used smokeless tobacco. She reports that she does not drink alcohol and does not use drugs.  Allergies  Allergen Reactions   Pregabalin     Other Reaction(s): feeling fatigue drunk feeling, falls   Tape Rash    Pt can tolerate the paper tape    Family History  Problem Relation Age of Onset   Stroke Father    Diabetes Maternal Grandmother     Prior to Admission medications   Medication Sig Start Date End Date Taking? Authorizing Provider  atorvastatin  (LIPITOR) 40 MG tablet Take 1 tablet by mouth once daily 08/21/23   Anner Alm ORN, MD  bumetanide  (BUMEX ) 1 MG tablet Take 1 tablet by mouth once daily 02/27/23   Jerilynn Lamarr HERO, NP  carvedilol  (COREG ) 3.125 MG tablet Take 1 tablet (3.125 mg total) by mouth 2 (two) times daily with a meal. 01/29/23   Jerilynn Lamarr HERO, NP  Continuous Glucose Sensor (FREESTYLE LIBRE 3 PLUS SENSOR) MISC as directed for 30 days e11.69 01/04/23   [provider]  Continuous Glucose Sensor (FREESTYLE LIBRE 3 SENSOR) MISC See admin instructions.    [provider]  empagliflozin  (JARDIANCE ) 10 MG TABS tablet Take 1 tablet (10 mg total) by mouth daily before breakfast. 04/09/23   Sabharwal, Aditya, DO  insulin  NPH-regular Human (70-30) 100 UNIT/ML injection Inject 10 Units into the skin in the morning.    [provider]   sacubitril -valsartan  (ENTRESTO ) 49-51 MG Take 1 tablet by mouth 2 (two) times daily. 05/09/23   Sabharwal, Aditya, DO  spironolactone  (ALDACTONE ) 25 MG tablet Take 0.5 tablets (12.5 mg total) by mouth daily. 01/29/23 04/08/24  Jerilynn Lamarr HERO, NP    Physical Exam: Vitals:   12/28/23 1845 12/28/23 1900 12/28/23 1915 12/28/23 1930  BP: (!) 163/56 (!) 170/62 (!) 168/58 (!) 158/56  Pulse: (!) 38 (!) 36 (!) 37 (!) 32  Resp: 17 17 12 15   Temp:      TempSrc:      SpO2: 99% 100%  98% 99%  Weight:      Height:       Physical Exam:  General: No acute distress, well developed, well nourished HEENT: Normocephalic, atraumatic, PERRL Cardiovascular: Normal rhythm. Bradycardic Pulmonary: Normal pulmonary effort, abnormal bs right apex Gastrointestinal: Nondistended abdomen, soft, tender, normoactive bowel sounds Musculoskeletal:Normal ROM, no lower ext edema Lymphadenopathy: No cervical LAD. Skin: Skin is warm and dry. Neuro: Very painful when she tries to move either of her legs.  AAOx3. PSYCH: Attentive and cooperative  Data Reviewed:  Results for orders placed or performed during the hospital encounter of 12/28/23 (from the past 24 hours)  CBC with Differential     Status: Abnormal   Collection Time: 12/28/23  5:58 PM  Result Value Ref Range   WBC 4.8 4.0 - 10.5 K/uL   RBC 3.29 (L) 3.87 - 5.11 MIL/uL   Hemoglobin 10.3 (L) 12.0 - 15.0 g/dL   HCT 68.7 (L) 63.9 - 53.9 %   MCV 94.8 80.0 - 100.0 fL   MCH 31.3 26.0 - 34.0 pg   MCHC 33.0 30.0 - 36.0 g/dL   RDW 86.3 88.4 - 84.4 %   Platelets 177 150 - 400 K/uL   nRBC 0.0 0.0 - 0.2 %   Neutrophils Relative % 64 %   Neutro Abs 3.1 1.7 - 7.7 K/uL   Lymphocytes Relative 26 %   Lymphs Abs 1.2 0.7 - 4.0 K/uL   Monocytes Relative 7 %   Monocytes Absolute 0.4 0.1 - 1.0 K/uL   Eosinophils Relative 0 %   Eosinophils Absolute 0.0 0.0 - 0.5 K/uL   Basophils Relative 1 %   Basophils Absolute 0.0 0.0 - 0.1 K/uL   Immature Granulocytes 2 %    Abs Immature Granulocytes 0.08 (H) 0.00 - 0.07 K/uL  Comprehensive metabolic panel     Status: Abnormal   Collection Time: 12/28/23  5:58 PM  Result Value Ref Range   Sodium 136 135 - 145 mmol/L   Potassium 5.0 3.5 - 5.1 mmol/L   Chloride 106 98 - 111 mmol/L   CO2 17 (L) 22 - 32 mmol/L   Glucose, Bld 177 (H) 70 - 99 mg/dL   BUN 81 (H) 8 - 23 mg/dL   Creatinine, Ser 7.43 (H) 0.44 - 1.00 mg/dL   Calcium  9.1 8.9 - 10.3 mg/dL   Total Protein 6.7 6.5 - 8.1 g/dL   Albumin 3.7 3.5 - 5.0 g/dL   AST 813 (H) 15 - 41 U/L   ALT 229 (H) 0 - 44 U/L   Alkaline Phosphatase 116 38 - 126 U/L   Total Bilirubin 0.8 0.0 - 1.2 mg/dL   GFR, Estimated 18 (L) >60 mL/min   Anion gap 13 5 - 15  Troponin I (High Sensitivity)     Status: Abnormal   Collection Time: 12/28/23  5:58 PM  Result Value Ref Range   Troponin I (High Sensitivity) 122 (HH) <18 ng/L  Brain natriuretic peptide     Status: Abnormal   Collection Time: 12/28/23  5:58 PM  Result Value Ref Range   B Natriuretic Peptide 2,489.8 (H) 0.0 - 100.0 pg/mL  Magnesium      Status: None   Collection Time: 12/28/23  5:58 PM  Result Value Ref Range   Magnesium  2.1 1.7 - 2.4 mg/dL  I-stat chem 8, ED (not at [facility], [facility] or [facility])     Status: Abnormal   Collection Time: 12/28/23  6:13 PM  Result Value Ref Range   Sodium 139 135 -  145 mmol/L   Potassium 5.2 (H) 3.5 - 5.1 mmol/L   Chloride 108 98 - 111 mmol/L   BUN 81 (H) 8 - 23 mg/dL   Creatinine, Ser 7.19 (H) 0.44 - 1.00 mg/dL   Glucose, Bld 827 (H) 70 - 99 mg/dL   Calcium , Ion 1.25 1.15 - 1.40 mmol/L   TCO2 20 (L) 22 - 32 mmol/L   Hemoglobin 10.2 (L) 12.0 - 15.0 g/dL   HCT 69.9 (L) 63.9 - 53.9 %  I-Stat CG4 Lactic Acid, ED     Status: Abnormal   Collection Time: 12/28/23  6:14 PM  Result Value Ref Range   Lactic Acid, Venous 2.0 (HH) 0.5 - 1.9 mmol/L   Comment NOTIFIED PHYSICIAN   Troponin I (High Sensitivity)     Status: Abnormal   Collection Time: 12/28/23  7:58 PM  Result Value Ref Range    Troponin I (High Sensitivity) 136 (HH) <18 ng/L   Echo 09/2022 IMPRESSION:   1. Left ventricular ejection fraction, by estimation, is 30 to 35%. The  left ventricle has moderately decreased function. The left ventricle  demonstrates global hypokinesis. Left ventricular diastolic parameters are  indeterminate.   2. Right ventricular systolic function is moderately reduced. The right  ventricular size is moderately enlarged. There is moderately elevated  pulmonary artery systolic pressure.   3. Left atrial size was moderately dilated.   4. Right atrial size was moderately dilated.   5. The mitral valve is degenerative. Mild to moderate mitral valve  regurgitation.   6. The tricuspid valve is degenerative. Tricuspid valve regurgitation is  severe.   7. The aortic valve is normal in structure. Aortic valve regurgitation is  not visualized.   8. The inferior vena cava is dilated in size with <50% respiratory  variability, suggesting right atrial pressure of 15 mmHg.   9. Cannot exclude a small PFO.   Assessment and Plan: Severe symptomatic bradycardia  - Appreciate cardiology management -Because the patient took her carvedilol  today (3:00 PM) the plan is to monitor her for the next 48 hours. - Cardiology has recommended no glucagon nor any other intervention at this time - Permissive hypertension - discontinue Carvedilol   2. S/p syncope with head trauma and bilateral hip pain -the patient had a CT of her head which was unremarkable. - Will check CT of her pelvis and C-spine as well - Continue pain control in the meantime - She may need physical therapy once her heart rate is controlled  3. Distant h/o breast cancer - on no medication  4.  Nonischemic cardiomyopathy - Holding Bumex , Entresto , Aldactone  per cardiology recommendation until bradycardia has been fixed.  5.  Hyperlipidemia - Hold Lipitor due to abnormal LFTs  6. DMT2 -  Patient been placed on Accu-Cheks before every  meal and nightly with sliding scale insulin  Hold Lantus  until eating regularly Diabetic Diet    Advance Care Planning:   Code Status: Full Code The patient names her daughter is her surrogate decision maker and she wants to be full code.  Consults: [facility] cardiology  Family Communication: daughter at bedside  Severity of Illness: The appropriate patient status for this patient is INPATIENT. Inpatient status is judged to be reasonable and necessary in order to provide the required intensity of service to ensure the patient's safety. The patient's presenting symptoms, physical exam findings, and initial radiographic and laboratory data in the context of their chronic comorbidities is felt to place them at high risk for further clinical deterioration.  Furthermore, it is not anticipated that the patient will be medically stable for discharge from the hospital within 2 midnights of admission.   * I certify that at the point of admission it is my clinical judgment that the patient will require inpatient hospital care spanning beyond 2 midnights from the point of admission due to high intensity of service, high risk for further deterioration and high frequency of surveillance required.*  Author: ARTHEA CHILD, MD 12/28/2023 8:36 PM  For on call review [email or web address].

## 2023-12-28 NOTE — ED Provider Notes (Signed)
 [place] EMERGENCY DEPARTMENT AT [facility] Provider Note  MDM   HPI/ROS:  [name] is a 78 y.o. female with a PMH of PAD, tobacco use, T2DM, HLD, CAD, CKD, breast cancer s/p lumpectomy/radiation and chemotherapy (tamoxifen) and HFrEF who presents to the emergency department for syncopal episode.  Patient was BIBEMS from home where she had a witnessed syncopal episode by her daughter after having sudden onset nausea vomiting and diarrhea at 4 AM after having Chinese food for dinner.  She reported some increased shortness of breath on exertion, hit the back of her head on the fridge.  With EMS, patient had a heart rate from 28-38, received 1 mg of atropine and 250 mL LR without improvement of HR.    On my initial evaluation, patient is:  -Vital signs significant for bradycardia at a rate of the 30s, normotensive. Patient afebrile, hemodynamically stable, and non-toxic appearing.  Physical exam is notable for: - Patient is in no acute distress, does appear fatigued, lungs are CTAB, no signs of volume overload, bedside echo without any evidence of pericardial effusion, moderate versus severely reduced EF. Notably patient states that she does take carvedilol , but only took her normal dose this morning. EKG appears to be in 2-1 AV block.  Patient has already received atropine without resolution, if patient's blood pressure drop plan for epinephrine / inotropic support.  Pads were placed on patient.  Plan for basic lab work, troponins, BNP, x-ray and imaging and cardiology consults. Spoke to cardiology, Dr. Lonni, who saw patient at bedside and plan for 48-hour washout with carvedilol  and wait until labs come back to determine cardiology versus medicine admission.  As long as blood pressure holds, they will continue to monitor and watch with consideration for PPM.  CBC without cytosis, hemoglobin at 10.3, CO2 17, BUN of 81 and creatinine 2.56.  New evidence of new AKI as well as  transaminitis with elevated AST and ALT 886 and 229 respectively.  Initial troponin 122.  Elevation in BNP at 2489.  Lactic of 2.0 and troponin uptrending at 136.  Given new AKI as well as only slight increase in troponin, plan for medicine admission to stepdown given persistent bradycardia with cardiology following.   Interpretations, interventions, and the patient's course of care are documented below.    Clinical Course as of 12/28/23 8082  [place] Dec 28, 2023  1806 Spoke to cardiology, Dr. Lonni.  Plan for 48 hour washout for carvedilol , wait until labs come back to determine cardiology versus medicine admission.  As long as blood pressure holds, will continue to monitor and watch.  If BP drops, likely inotropic support  [SA]    Clinical Course User Index [SA] Billy Pal, MD     Disposition:  I discussed the case with hospitalist who graciously agreed to admit the patient to their service for continued care.   Clinical Impression:  1. Bradycardia     Clinical Complexity A medically appropriate history, review of systems, and physical exam was performed.  My independent interpretations of EKG, labs, and radiology are documented in the ED course above.   If decision rules were used in this patient's evaluation, they are listed below.   Click here for ABCD2, HEART and other calculatorsREFRESH Note before signing   Patient's presentation is most consistent with acute presentation with potential threat to life or bodily function.  Medical Decision Making Amount and/or Complexity of Data Reviewed Labs: ordered. Radiology: ordered.  Risk Prescription drug management. Decision regarding hospitalization.  HPI/ROS      See MDM section for pertinent HPI and ROS. A complete ROS was performed with pertinent positives/negatives noted above.   Past Medical History:  Diagnosis Date   Breast cancer (HCC)    breast - left    Chronic diastolic CHF (congestive heart  failure), NYHA class 2 (HCC)    a. cMRI 5/10: EF 38% // b. Echo 3/13/: EF 50-55, Gr 1 DD // c. Echo 8/14: EF 40, inf/inf-septal HK, Gr 1 DD, mild MR // d. Echo 5/17: EF 40-45, inf HK, Gr 1 DD, mild MR, mild LAE, reduced RVSF, mild TR, PASP 33 // e. Echo 10/17: EF 40-45, Gr 2 DD, mild MR, severe LAE; => 09/2020 EF 50-55%, GrII DD.   Coronary artery disease, non-occlusive    a. nonobs by LHC in 2010 // b. Myoview  10/17: Lg infarct apex, distal ant and lat walls, no ischemia, EF 38; int risk  // c. LHC 10/17:  dLAD 15, pLCx 30, mRCA 40, LVEDP 15   Diabetes mellitus    NICM (nonischemic cardiomyopathy) (HCC)     Past Surgical History:  Procedure Laterality Date   APPENDECTOMY     BREAST LUMPECTOMY     lt breast   CARDIAC CATHETERIZATION N/A 12/20/2015   Procedure: Left Heart Cath and Coronary Angiography;  Surgeon: Lonni Hanson, MD:: Mild to moderate, nonobstructive coronary artery disease, including 15% distal LAD stenosis, 30% ulcerated mid LCx disease, and 40% mid RCA stenosis.  Upper normal left ventricular filling pressure (LVEDP 15 mmHg).   RIGHT HEART CATH N/A 05/08/2016   Procedure: Right Heart Cath;  Surgeon: Toribio JONELLE Fuel, MD;  [facility]: [facility];; RA = 3, RV = 34/5, PA = 38/12 (21), PCW = 12; Fick cardiac output/index = 4.6/2.6; PVR = 1.9 WU, Ao sat = 96%; PA sat = 58%, 62%    TRANSTHORACIC ECHOCARDIOGRAM  11/2015   a) 11/2015: EF 40 and 45%.  GRII DD; b) 04/2016: EF 20% with grade 2 diastolic dysfunction   TRANSTHORACIC ECHOCARDIOGRAM  10/2016   a) EF 55-60%.  Normal LV function.  Mild DD.[name]  Normal valve Fxn.; b) 09/2020: EF 50 to 55%.  Normal function.  No R WMA.  Mild LVH.  GR 2 DD with elevated LAP.-Mild LA dilation.  Moderately elevated PAP estimated RVSP 47 mmHg, and RAP 8 mmHg.   TRIGGER FINGER RELEASE Left 04/23/2017   Procedure: RELEASE TRIGGER FINGER TO LEFT FORTH FINGER;  Surgeon: Marcus Lung, MD;  Location: [facility];  Service: Plastics;   Laterality: Left;      Physical Exam   Vitals:   12/28/23 1750 12/28/23 1754  Pulse:  (!) 34  Resp:  18  Temp:  97.8 F (36.6 C)  TempSrc:  Oral  SpO2:  100%  Weight: 56.7 kg   Height: 5' 3.5 (1.613 m)     Physical Exam Vitals and nursing note reviewed.  Constitutional:      General: She is not in acute distress.    Appearance: She is well-developed.  HENT:     Head: Normocephalic and atraumatic.  Eyes:     Conjunctiva/sclera: Conjunctivae normal.  Cardiovascular:     Rate and Rhythm: Normal rate and regular rhythm.     Heart sounds: No murmur heard. Pulmonary:     Effort: Pulmonary effort is normal. No respiratory distress.     Breath sounds: Normal breath sounds.  Abdominal:     Palpations: Abdomen is soft.  Tenderness: There is no abdominal tenderness.  Musculoskeletal:        General: No swelling.     Cervical back: Neck supple.  Skin:    General: Skin is warm and dry.     Capillary Refill: Capillary refill takes less than 2 seconds.  Neurological:     Mental Status: She is alert.  Psychiatric:        Mood and Affect: Mood normal.      Procedures   If procedures were preformed on this patient, they are listed below:  Procedures   Please note that this documentation was produced with the assistance of voice-to-text technology and may contain errors.     Billy Pal, MD 12/30/23 1509    Patt Alm Macho, MD 12/30/23 [phone]

## 2023-12-29 ENCOUNTER — Inpatient Hospital Stay (HOSPITAL_COMMUNITY)

## 2023-12-29 ENCOUNTER — Encounter (HOSPITAL_COMMUNITY)
Admission: EM | Disposition: A | Discharge status: Rehabilitation Facility | Payer: Self-pay | Source: Home / Self Care | Attending: Internal Medicine

## 2023-12-29 DIAGNOSIS — E119 Type 2 diabetes mellitus without complications: Secondary | ICD-10-CM

## 2023-12-29 DIAGNOSIS — I442 Atrioventricular block, complete: Secondary | ICD-10-CM

## 2023-12-29 DIAGNOSIS — I5023 Acute on chronic systolic (congestive) heart failure: Secondary | ICD-10-CM | POA: Diagnosis not present

## 2023-12-29 DIAGNOSIS — R001 Bradycardia, unspecified: Secondary | ICD-10-CM | POA: Diagnosis not present

## 2023-12-29 DIAGNOSIS — I441 Atrioventricular block, second degree: Secondary | ICD-10-CM | POA: Diagnosis not present

## 2023-12-29 DIAGNOSIS — E8729 Other acidosis: Secondary | ICD-10-CM

## 2023-12-29 HISTORY — PX: TEMPORARY PACEMAKER: CATH118268

## 2023-12-29 LAB — GLUCOSE, CAPILLARY
Glucose-Capillary: 217 mg/dL — ABNORMAL HIGH (ref 70–99)
Glucose-Capillary: 174 mg/dL — ABNORMAL HIGH (ref 70–99)
Glucose-Capillary: 166 mg/dL — ABNORMAL HIGH (ref 70–99)
Glucose-Capillary: 135 mg/dL — ABNORMAL HIGH (ref 70–99)
Glucose-Capillary: 159 mg/dL — ABNORMAL HIGH (ref 70–99)

## 2023-12-29 LAB — COMPREHENSIVE METABOLIC PANEL WITH GFR
ALT: 175 U/L — ABNORMAL HIGH (ref 0–44)
AST: 93 U/L — ABNORMAL HIGH (ref 15–41)
Albumin: 3.5 g/dL (ref 3.5–5.0)
Alkaline Phosphatase: 95 U/L (ref 38–126)
Anion gap: 13 (ref 5–15)
BUN: 86 mg/dL — ABNORMAL HIGH (ref 8–23)
CO2: 18 mmol/L — ABNORMAL LOW (ref 22–32)
Calcium: 9.4 mg/dL (ref 8.9–10.3)
Chloride: 107 mmol/L (ref 98–111)
Creatinine, Ser: 2.7 mg/dL — ABNORMAL HIGH (ref 0.44–1.00)
GFR, Estimated: 17 mL/min — ABNORMAL LOW (ref >60)
Glucose, Bld: 203 mg/dL — ABNORMAL HIGH (ref 70–99)
Potassium: 5.2 mmol/L — ABNORMAL HIGH (ref 3.5–5.1)
Sodium: 138 mmol/L (ref 135–145)
Total Bilirubin: 1 mg/dL (ref 0.0–1.2)
Total Protein: 6.3 g/dL — ABNORMAL LOW (ref 6.5–8.1)

## 2023-12-29 LAB — BASIC METABOLIC PANEL WITH GFR
Anion gap: 12 (ref 5–15)
BUN: 87 mg/dL — ABNORMAL HIGH (ref 8–23)
CO2: 19 mmol/L — ABNORMAL LOW (ref 22–32)
Calcium: 9.1 mg/dL (ref 8.9–10.3)
Chloride: 104 mmol/L (ref 98–111)
Creatinine, Ser: 2.91 mg/dL — ABNORMAL HIGH (ref 0.44–1.00)
GFR, Estimated: 16 mL/min — ABNORMAL LOW (ref >60)
Glucose, Bld: 157 mg/dL — ABNORMAL HIGH (ref 70–99)
Potassium: 4.7 mmol/L (ref 3.5–5.1)
Sodium: 135 mmol/L (ref 135–145)

## 2023-12-29 LAB — ECHOCARDIOGRAM COMPLETE
AR max vel: 1.88 cm2
AV Area VTI: 2.04 cm2
AV Area mean vel: 1.86 cm2
AV Mean grad: 5.6 mmHg
AV Peak grad: 9.7 mmHg
Ao pk vel: 1.56 m/s
Area-P 1/2: 6.71 cm2
Height: 63.5 in
S' Lateral: 2.9 cm
Weight: 2038.81 [oz_av]

## 2023-12-29 LAB — LACTIC ACID, PLASMA
Lactic Acid, Venous: 1.8 mmol/L (ref 0.5–1.9)
Lactic Acid, Venous: 1.1 mmol/L (ref 0.5–1.9)

## 2023-12-29 LAB — HEMOGLOBIN A1C
Hgb A1c MFr Bld: 8.1 % — ABNORMAL HIGH (ref 4.8–5.6)
Mean Plasma Glucose: 185.77 mg/dL

## 2023-12-29 LAB — CBC
HCT: 30.2 % — ABNORMAL LOW (ref 36.0–46.0)
Hemoglobin: 10 g/dL — ABNORMAL LOW (ref 12.0–15.0)
MCH: 31.3 pg (ref 26.0–34.0)
MCHC: 33.1 g/dL (ref 30.0–36.0)
MCV: 94.7 fL (ref 80.0–100.0)
Platelets: 161 K/uL (ref 150–400)
RBC: 3.19 MIL/uL — ABNORMAL LOW (ref 3.87–5.11)
RDW: 13.9 % (ref 11.5–15.5)
WBC: 7.5 K/uL (ref 4.0–10.5)
nRBC: 0 % (ref 0.0–0.2)

## 2023-12-29 LAB — TSH: TSH: 2.604 u[IU]/mL (ref 0.350–4.500)

## 2023-12-29 LAB — MRSA NEXT GEN BY PCR, NASAL: MRSA by PCR Next Gen: NOT DETECTED

## 2023-12-29 SURGERY — TEMPORARY PACEMAKER
Anesthesia: LOCAL | Site: Neck | Laterality: Right

## 2023-12-29 MED ORDER — GLUCAGON HCL RDNA (DIAGNOSTIC) 1 MG IJ SOLR
5.0000 mg | Freq: Once | INTRAVENOUS | Status: AC
  Administered 2023-12-29: 5 mg via INTRAVENOUS
  Filled 2023-12-29: qty 5

## 2023-12-29 MED ORDER — ORAL CARE MOUTH RINSE: 15.0000 mL | OROMUCOSAL | Status: DC | PRN

## 2023-12-29 MED ORDER — MORPHINE SULFATE (PF) 2 MG/ML IV SOLN: 1.0000 mg | INTRAVENOUS | Status: DC | PRN

## 2023-12-29 MED ORDER — ONDANSETRON HCL 4 MG/2ML IJ SOLN
4.0000 mg | Freq: Once | INTRAMUSCULAR | Status: AC
  Administered 2023-12-29: 4 mg via INTRAVENOUS
  Filled 2023-12-29: qty 2

## 2023-12-29 MED ORDER — DOPAMINE-DEXTROSE 3.2-5 MG/ML-% IV SOLN
2.5000 ug/kg/min | INTRAVENOUS | Status: DC
  Administered 2023-12-29: 2.5 ug/kg/min via INTRAVENOUS
  Filled 2023-12-29: qty 250

## 2023-12-29 MED ORDER — HEPARIN SODIUM (PORCINE) 5000 UNIT/ML IJ SOLN
5000.0000 [IU] | Freq: Three times a day (TID) | INTRAMUSCULAR | Status: DC
  Administered 2023-12-29 – 2024-01-01 (×9): 5000 [IU] via SUBCUTANEOUS
  Filled 2023-12-29 (×9): qty 1

## 2023-12-29 MED ORDER — LIDOCAINE HCL (PF) 1 % IJ SOLN
INTRAMUSCULAR | Status: DC | PRN
  Administered 2023-12-29: 2 mL

## 2023-12-29 MED ORDER — SODIUM CHLORIDE 0.9 % IV SOLN: INTRAVENOUS | Status: AC

## 2023-12-29 MED ORDER — LIDOCAINE HCL (PF) 1 % IJ SOLN
INTRAMUSCULAR | Status: AC
  Filled 2023-12-29: qty 30

## 2023-12-29 MED ORDER — SODIUM CHLORIDE 0.9% FLUSH: 10.0000 mL | INTRAVENOUS | Status: DC | PRN

## 2023-12-29 MED ORDER — CHLORHEXIDINE GLUCONATE CLOTH 2 % EX PADS
6.0000 | MEDICATED_PAD | Freq: Every day | CUTANEOUS | Status: DC
  Administered 2023-12-29 – 2024-01-04 (×5): 6 via TOPICAL

## 2023-12-29 MED ORDER — FENTANYL CITRATE (PF) 50 MCG/ML IJ SOSY
12.5000 ug | PREFILLED_SYRINGE | INTRAMUSCULAR | Status: DC | PRN
  Administered 2024-01-01: 12.5 ug via INTRAVENOUS
  Filled 2023-12-29: qty 1

## 2023-12-29 MED ORDER — SODIUM CHLORIDE 0.9% FLUSH
10.0000 mL | Freq: Two times a day (BID) | INTRAVENOUS | Status: DC
  Administered 2023-12-29 – 2024-01-05 (×14): 10 mL

## 2023-12-29 MED ORDER — SODIUM CHLORIDE 0.9 % IV SOLN
INTRAVENOUS | Status: AC | PRN
  Administered 2023-12-29: 10 mL/h via INTRAVENOUS

## 2023-12-29 SURGICAL SUPPLY — 9 items
CABLE ADAPT PACING TEMP 12FT (ADAPTER) IMPLANT
GLIDESHEATH SLEND SS 6F .021 (SHEATH) IMPLANT
GUIDEWIRE INQWIRE 1.5J.035X260 (WIRE) IMPLANT
KIT ENCORE 26 ADVANTAGE (KITS) IMPLANT
SHEATH PINNACLE 6F 10CM (SHEATH) IMPLANT
SHEATH PROBE COVER 6X72 (BAG) IMPLANT
SHIELD CATHGARD ARROW (MISCELLANEOUS) IMPLANT
WIRE MICRO SET SILHO 5FR 7 (SHEATH) IMPLANT
WIRE PACING TEMP ST TIP 5 (CATHETERS) IMPLANT

## 2023-12-29 NOTE — Progress Notes (Addendum)
 Assessed at bedside. Complains of fatigue and pain from being paced.   BP (!) 153/99   Pulse (!) 21   Temp 97.8 F (36.6 C) (Oral)   Resp 15   Ht 5' 3.5 (1.613 m)   Wt 57.8 kg   SpO2 97%   BMI 22.22 kg/m  Elderly woman lying in bed sleeping, wakes up to verbal stimulation Breathing comfortably on [place], CTAB JVD, S1S2, bradycardic No peripheral edema No abdominal distention or pain.  Lactate and BMP pending.  D/w Cardiology- needs TVP. Fentanyl  ordered for pain.  Leita SHAUNNA Gaskins, DO 12/29/23 7:35 AM [facility]  For contact information, see Amion. If no response to pager, please call PCCM consult pager. After hours, 7PM- 7AM, please call Elink.

## 2023-12-29 NOTE — H&P (Signed)
   NAME:  ALBERTA LENHARD, MRN:  [id number], DOB:  Sep 27, 1945, LOS: 1 ADMISSION DATE:  12/28/2023 History of Present Illness:  This is a case of an 78 year old female patient with a past medical history of hypertension, CKD with baseline creatinine of 1.8 mg/dL, nonischemic cardiomyopathy with a EF 30%, type 2 diabetes mellitus presenting to [facility] on 11/07 for dizziness lightheadedness and nausea.  In the ED she was noted to be bradycardic.  EKG with complete heart block.  Heart rate as low as 30.  However patient mentating well.  Hemodynamically stable.  Labs without any significant electrolyte abnormalities.  AKI noted with a creatinine of 2.56 mg/dL from a baseline of 1.7 mg/dL.  Elevated BNP at 2500.  Troponin flat in the 120s/130s.  Mildly elevated AST/ALT at 229 and 186.  Lactic acid 2.0.  Cardiology evaluated and given hemodynamic stability no intervention at this time.  Started on glucagon.  She is being admitted to the ICU for close monitoring.  Pertinent  Medical History  As above  Significant Hospital Events: Including procedures, antibiotic start and stop dates in addition to other pertinent events   12/29/2023 -admitted to the ICU.  Objective    Blood pressure (!) 153/58, pulse (!) 40, temperature 98 F (36.7 C), temperature source Oral, resp. rate 13, height 5' 3.5 (1.613 m), weight 56.7 kg, SpO2 100%.        Intake/Output Summary (Last 24 hours) at 12/29/2023 0417 Last data filed at 12/29/2023 0148 Gross per 24 hour  Intake 50 ml  Output --  Net 50 ml   Filed Weights   12/28/23 1750  Weight: 56.7 kg    Examination: General: No acute distress, awake alert, complaining of dizziness HENT: Supple neck, reactive pupils Lungs: Clear bilateral air entry Cardiovascular: Normal S1, normal S2, bradycardic Abdomen: Soft nontender nondistended Extremities: Cold no edema  Labs and imaging were reviewed  Assessment and Plan  This is a case of an 78 year old  female patient with a past medical history of hypertension, CKD with baseline creatinine of 1.8 mg/dL, nonischemic cardiomyopathy with a EF 30%, type 2 diabetes mellitus presenting to [facility] on 11/07 for dizziness lightheadedness and nausea. Found to be in complete heart block, admitted to the ICU for close monitoring.   #Complete heart block possibly in the setting of AKI and being on Coreg  though low dose and appears out of proportion, possibly AV nodal disease. Trops flat 120 to 130.  #AKI on CKD with Cr. 2.8mg .dl from a baseline of 1.7mg /DL.  #Lactic acidosis in the setting of the above.  #Type II DM  #NICM with EF 30% on GDMT   Neuro: Delirium precautions.  CVS: Atropine at bedside. Pads on. Glucagon x1. Hold home GDMT for now. Appreciate cardiology input.  Renal: LA, trend Cr. Daily.  Heme: subcutaneous heparin  for DVT px.  Endo: POC 140 to 180   Critical care time: 60 minutes    Darrin Barn, MD [facility] 12/29/2023 4:30 AM

## 2023-12-29 NOTE — Interval H&P Note (Signed)
 History and Physical Interval Note:  12/29/2023 8:29 AM  [name]  has presented today for surgery, with the diagnosis of COMPLETE HEART BLOCK.  The various methods of treatment have been discussed with the patient and family. After consideration of risks, benefits and other options for treatment, the patient has consented to  Procedure(s): TEMPORARY PACEMAKER (Right) as a surgical intervention.    The patient's history has been reviewed, patient examined, no change in status, stable for surgery.  I have reviewed the patient's chart and labs.  Questions were answered to the patient's satisfaction.     Alm Clay

## 2023-12-29 NOTE — Progress Notes (Signed)
 Echocardiogram 2D Echocardiogram has been performed.  Jim Lundin N Broden Holt,RDCS 12/29/2023, 11:46 AM

## 2023-12-29 NOTE — Progress Notes (Addendum)
 I was notified that patient heart rate was in 20s, blood pressure is stable in fact hypertensive.   Initially thought to be due to 1 AV block, patient is in fact in complete heart block with heart rate 29 bpm with ventricular escape rhythm in the fashion of right bundle branch block and left anterior fascicular block  I evaluated the patient at bedside. She is warm extremities, blood pressure is stable, and complete heart block with a heart rate in 29 bpm   Complete heart block, blood pressure is fortunately stable in fact high, Was on Coreg  which has been held  I will go ahead and give her glucagon as well as Zofran , If she has further drop in heart rate and blood pressure then we will need to start her on dopamine or a temporary wire and activating Cath Lab  Admit to critical care floor Continue telemetry. - Pads attached, have atropine handy She will probably need pacemaker if no improvement in her rhythm, which I suspect she will indeed need a pacemaker.

## 2023-12-29 NOTE — Progress Notes (Signed)
 eLink Physician-Brief Progress Note Patient Name: [name] DOB: 05-01-1945 MRN: [id number]   Date of Service  12/29/2023  HPI/Events of Note  78/F with hypertension, CKD, non-ischemic cardiomyopathy with EF 30%, DM, presenting to the ED with lightheadedness, dizziness and nausea, found to have complete heart block with rate in the 30s. Pt given glucagon.    BP 158/56, HR 39, RR 16, O2 sats 99% Pt is awake and alert.   eICU Interventions  Hold AV nodal blocking agents.  Insulin  for glucose control.  Cardiology consult.  Monitor I&Os.  Start on NS at 50cc/hr.  Continue to trend lactate and troponin.  Heparin  for DVT Prophylaxis.      Intervention Category Evaluation Type: New Patient Evaluation  [name] 12/29/2023, 5:32 AM

## 2023-12-29 NOTE — Consult Note (Addendum)
 Cardiology Consultation   Patient ID: GLYNIS HUNSUCKER MRN: [id number]; DOB: 1945-05-30  Admit date: 12/28/2023 Date of Consult: 12/29/2023  PCP:  Regino Slater, MD   [facility] Providers Cardiologist:  Alm Clay, MD        History of Present Illness: Ms. Janicki is an 78 year old female with past medical history notable for NICM, chronic systolic and diastolic heart failure, nonobstructive CAD, chronic kidney disease stage 3b who presented to the ED on 11/7 with general fatigue and feeling unwell.  She had her 78th birthday on Thursday.  Her son brought her Chinese food.  She woke up overnight feeling poorly with nausea and diarrhea and had an episode of near syncope.  She continued to feel poorly throughout Friday and eventually requested her daughter bring her to the ED.  She was found to be bradycardic.  Since admission she has been in complete heart block and intermittently requiring transcutaneous pacing.  She reports that up until Thursday she was doing relatively well.   Past Medical History:  Diagnosis Date   Breast cancer (HCC)    breast - left    Chronic diastolic CHF (congestive heart failure), NYHA class 2 (HCC)    a. cMRI 5/10: EF 38% // b. Echo 3/13/: EF 50-55, Gr 1 DD // c. Echo 8/14: EF 40, inf/inf-septal HK, Gr 1 DD, mild MR // d. Echo 5/17: EF 40-45, inf HK, Gr 1 DD, mild MR, mild LAE, reduced RVSF, mild TR, PASP 33 // e. Echo 10/17: EF 40-45, Gr 2 DD, mild MR, severe LAE; => 09/2020 EF 50-55%, GrII DD.   Coronary artery disease, non-occlusive    a. nonobs by LHC in 2010 // b. Myoview  10/17: Lg infarct apex, distal ant and lat walls, no ischemia, EF 38; int risk  // c. LHC 10/17:  dLAD 15, pLCx 30, mRCA 40, LVEDP 15   Diabetes mellitus    NICM (nonischemic cardiomyopathy) (HCC)     Past Surgical History:  Procedure Laterality Date   APPENDECTOMY     BREAST LUMPECTOMY     lt breast   CARDIAC CATHETERIZATION N/A 12/20/2015   Procedure: Left Heart Cath and  Coronary Angiography;  Surgeon: Lonni Hanson, MD:: Mild to moderate, nonobstructive coronary artery disease, including 15% distal LAD stenosis, 30% ulcerated mid LCx disease, and 40% mid RCA stenosis.  Upper normal left ventricular filling pressure (LVEDP 15 mmHg).   RIGHT HEART CATH N/A 05/08/2016   Procedure: Right Heart Cath;  Surgeon: Toribio JONELLE Fuel, MD;  Location: [facility];; RA = 3, RV = 34/5, PA = 38/12 (21), PCW = 12; Fick cardiac output/index = 4.6/2.6; PVR = 1.9 WU, Ao sat = 96%; PA sat = 58%, 62%    TRANSTHORACIC ECHOCARDIOGRAM  11/2015   a) 11/2015: EF 40 and 45%.  GRII DD; b) 04/2016: EF 20% with grade 2 diastolic dysfunction   TRANSTHORACIC ECHOCARDIOGRAM  10/2016   a) EF 55-60%.  Normal LV function.  Mild DD.[name]  Normal valve Fxn.; b) 09/2020: EF 50 to 55%.  Normal function.  No R WMA.  Mild LVH.  GR 2 DD with elevated LAP.-Mild LA dilation.  Moderately elevated PAP estimated RVSP 47 mmHg, and RAP 8 mmHg.   TRIGGER FINGER RELEASE Left 04/23/2017   Procedure: RELEASE TRIGGER FINGER TO LEFT FORTH FINGER;  Surgeon: Marcus Lung, MD;  Location: [facility];  Service: Plastics;  Laterality: Left;    Scheduled Meds:  heparin  injection (subcutaneous)  5,000 Units  Subcutaneous Q8H   insulin  aspart  0-6 Units Subcutaneous TID WC   Continuous Infusions:  sodium chloride  50 mL/hr at 12/29/23 0547   DOPamine 2.5 mcg/kg/min (12/29/23 0648)   PRN Meds: acetaminophen  **OR** acetaminophen , bisacodyl , fentaNYL  (SUBLIMAZE ) injection, ondansetron  **OR** ondansetron  (ZOFRAN ) IV, mouth rinse  Allergies:    Allergies  Allergen Reactions   Pregabalin     Other Reaction(s): feeling fatigue drunk feeling, falls   Tape Rash    Pt can tolerate the paper tape    Social History:   Social History   Socioeconomic History   Marital status: Widowed    Spouse name: Not on file   Number of children: 2   Years of education: Not on file   Highest education level: Not on  file  Occupational History   Occupation: retired  Tobacco Use   Smoking status: Former    Current packs/day: 0.00    Types: Cigarettes    Start date: 09/30/1982    Quit date: 09/29/2012    Years since quitting: 11.2   Smokeless tobacco: Never  Vaping Use   Vaping status: Never Used  Substance and Sexual Activity   Alcohol use: No   Drug use: No   Sexual activity: Not on file  Other Topics Concern   Not on file  Social History Narrative   Not on file   Social Drivers of Health   Financial Resource Strain: Low Risk  (09/25/2022)   Overall Financial Resource Strain (CARDIA)    Difficulty of Paying Living Expenses: Not very hard  Food Insecurity: Patient Declined (09/25/2022)   Hunger Vital Sign    Worried About Running Out of Food in the Last Year: Patient declined    Ran Out of Food in the Last Year: Patient declined  Transportation Needs: No Transportation Needs (09/25/2022)   PRAPARE - Administrator, Civil Service (Medical): No    Lack of Transportation (Non-Medical): No  Physical Activity: Not on file  Stress: Not on file  Social Connections: Not on file  Intimate Partner Violence: Not At Risk (09/25/2022)   Humiliation, Afraid, Rape, and Kick questionnaire    Fear of Current or Ex-Partner: No    Emotionally Abused: No    Physically Abused: No    Sexually Abused: No    Family History:   Family History  Problem Relation Age of Onset   Stroke Father    Diabetes Maternal Grandmother      ROS:  Please see the history of present illness.   All other ROS reviewed and negative.     Physical Exam/Data: Vitals:   12/29/23 0517 12/29/23 0530 12/29/23 0545 12/29/23 0600  BP: (!) 140/52 (!) 159/56 (!) 170/60 (!) 153/99  Pulse: (!) 35 (!) 41 (!) 37 (!) 21  Resp: 13 15 15 15   Temp:      TempSrc:      SpO2: 98% 96% 100% 97%  Weight:      Height:        Intake/Output Summary (Last 24 hours) at 12/29/2023 0741 Last data filed at 12/29/2023 0148 Gross per 24  hour  Intake 50 ml  Output --  Net 50 ml      12/29/2023    4:17 AM 12/28/2023    5:50 PM 04/09/2023    1:58 PM  Last 3 Weights  Weight (lbs) 127 lb 6.8 oz 125 lb 117 lb 9.6 oz  Weight (kg) 57.8 kg 56.7 kg 53.343 kg     Body  mass index is 22.22 kg/m.  General: Well developed, in no acute distress.  Neck: Elevated JVD.  Cardiac: Bradycardic, regular rhythm.  Resp: Normal work of breathing.  Ext: No edema.  Neuro: No gross focal deficits.  Psych: Normal affect.   EKG:  The EKG was personally reviewed and demonstrates: SR with CHB and ventricular escape   Telemetry:  Telemetry was personally reviewed and demonstrates:  Sinus rhythm with complete heart block, slow escape and transcutaneous pacing  Relevant CV Studies: Echo 09/25/22: 1. Left ventricular ejection fraction, by estimation, is 30 to 35%. The  left ventricle has moderately decreased function. The left ventricle  demonstrates global hypokinesis. Left ventricular diastolic parameters are  indeterminate.   2. Right ventricular systolic function is moderately reduced. The right  ventricular size is moderately enlarged. There is moderately elevated  pulmonary artery systolic pressure.   3. Left atrial size was moderately dilated.   4. Right atrial size was moderately dilated.   5. The mitral valve is degenerative. Mild to moderate mitral valve  regurgitation.   6. The tricuspid valve is degenerative. Tricuspid valve regurgitation is  severe.   7. The aortic valve is normal in structure. Aortic valve regurgitation is  not visualized.   8. The inferior vena cava is dilated in size with <50% respiratory  variability, suggesting right atrial pressure of 15 mmHg.   9. Cannot exclude a small PFO.   Laboratory Data: High Sensitivity Troponin:   Recent Labs  Lab 12/28/23 1758 12/28/23 1958  TROPONINIHS 122* 136*     Chemistry Recent Labs  Lab 12/28/23 1758 12/28/23 1813  NA 136 139  K 5.0 5.2*  CL 106 108  CO2  17*  --   GLUCOSE 177* 172*  BUN 81* 81*  CREATININE 2.56* 2.80*  CALCIUM  9.1  --   MG 2.1  --   GFRNONAA 18*  --   ANIONGAP 13  --     Recent Labs  Lab 12/28/23 1758  PROT 6.7  ALBUMIN 3.7  AST 186*  ALT 229*  ALKPHOS 116  BILITOT 0.8   Lipids No results for input(s): CHOL, TRIG, HDL, LABVLDL, LDLCALC, CHOLHDL in the last 168 hours.  Hematology Recent Labs  Lab 12/28/23 1758 12/28/23 1813 12/29/23 0703  WBC 4.8  --  7.5  RBC 3.29*  --  3.19*  HGB 10.3* 10.2* 10.0*  HCT 31.2* 30.0* 30.2*  MCV 94.8  --  94.7  MCH 31.3  --  31.3  MCHC 33.0  --  33.1  RDW 13.6  --  13.9  PLT 177  --  161   Thyroid No results for input(s): TSH, FREET4 in the last 168 hours.  BNP Recent Labs  Lab 12/28/23 1758  BNP 2,489.8*    DDimer No results for input(s): DDIMER in the last 168 hours.  Radiology/Studies:  CT PELVIS WO CONTRAST Result Date: 12/29/2023 EXAM: CT PELVIS, WITHOUT IV CONTRAST 12/28/2023 09:08:00 PM TECHNIQUE: Axial images were acquired through the pelvis without IV contrast. Reformatted images were reviewed. Automated exposure control, iterative reconstruction, and/or weight based adjustment of the mA/kV was utilized to reduce the radiation dose to as low as reasonably achievable. COMPARISON: None available. CLINICAL HISTORY: bilateral hip pain post fall FINDINGS: BONES: A right inferior pubic ramus fracture is noted without significant displacement. No other pelvic fractures are noted. No evidence of acute proximal femoral fracture. JOINTS: No dislocation. SOFT TISSUES: No surrounding soft tissue abnormality is seen. Vascular calcifications of the aorta are noted.  INTRAPELVIC CONTENTS: Visualized bowel is within normal limits. The bladder is within normal limits. IMPRESSION: 1. Right inferior pubic ramus fracture without significant displacement. No other pelvic fractures. 2. No acute proximal femoral fracture. Electronically signed by: Oneil Devonshire MD  12/29/2023 12:51 AM EST RP Workstation: [id number]   CT LUMBAR SPINE WO CONTRAST Result Date: 12/28/2023 CLINICAL DATA:  Fall EXAM: CT LUMBAR SPINE WITHOUT CONTRAST TECHNIQUE: Multidetector CT imaging of the lumbar spine was performed without intravenous contrast administration. Multiplanar CT image reconstructions were also generated. RADIATION DOSE REDUCTION: This exam was performed according to the departmental dose-optimization program which includes automated exposure control, adjustment of the mA and/or kV according to patient size and/or use of iterative reconstruction technique. COMPARISON:  CT 02/27/2022 FINDINGS: Segmentation: 5 lumbar type vertebrae. Alignment: Normal. Vertebrae: Acute or subacute appearing fracture involving the superior endplate of L1 with shallow depression, estimated 10% loss of vertebral body height centrally. No significant retropulsion. Interval Schmorl's node and mild superior endplate deformity at L2. Probable acute fracture involving the superior endplate of L3 with estimated 20% loss of vertebral body height centrally. Minimal 2 mm retropulsion of the upper vertebral body, without significant canal stenosis. Fracture does not involve the pedicle or lamina. Paraspinal and other soft tissues: Suspicion of mild paravertebral edema at L3. Aortic atherosclerosis. Disc levels: At L1-L2, patent disc space. No canal stenosis or high-grade foraminal narrowing. At L2-L3, patent disc space. No high-grade canal stenosis or foraminal narrowing. At L3-L4, patent disc space. Mild ligamentum flavum thickening and facet degenerative changes. No high-grade canal stenosis. Mild disc bulge. Patent foramen. At L4-L5, disc bulge. Ligamentum flavum thickening and moderate hypertrophic facet degenerative changes result in mild canal stenosis. No high-grade foraminal narrowing. At L5-S1, patent disc space. Diffuse disc bulge. Ligamentum flavum thickening and moderate facet degenerative changes. No  high-grade canal stenosis or foraminal narrowing. IMPRESSION: 1. Acute or subacute appearing fracture involving the superior endplate of L1 with estimated 10% loss of vertebral body height centrally. No significant retropulsion. 2. Probable acute fracture involving the superior endplate of L3 with estimated 20% loss of vertebral body height centrally. Minimal 2 mm retropulsion of the upper vertebral body without significant canal stenosis. 3. Interval Schmorl's node and mild superior endplate deformity at L2. 4. Mild degenerative changes of the lumbar spine as described above. 5. Aortic atherosclerosis. Aortic Atherosclerosis (ICD10-I70.0). Electronically Signed   By: Luke Bun M.D.   On: 12/28/2023 21:45   CT CERVICAL SPINE WO CONTRAST Result Date: 12/28/2023 EXAM: CT Cervical Spine Without Contrast 12/28/2023 08:55:00 PM TECHNIQUE: CT of the cervical spine was performed without the administration of intravenous contrast. Multiplanar reformatted images are provided for review. Automated exposure control, iterative reconstruction, and/or weight based adjustment of the mA/kV was utilized to reduce the radiation dose to as low as reasonably achievable. COMPARISON: MRI cervical spine 09/28/2020 CLINICAL HISTORY: Fall FINDINGS: BONES AND ALIGNMENT: No acute fracture or traumatic malalignment. Chronic degenerative anterolisthesis of C4 on C5. DEGENERATIVE CHANGES: Facet and uncovertebral hypertrophy with severe foraminal stenosis at multiple levels, better characterized on 2022 MRI. Degenerative disc disease is greatest at C5-C6. SOFT TISSUES: No prevertebral soft tissue swelling. IMPRESSION: 1. No acute abnormality of the cervical spine. Electronically signed by: Gilmore Molt MD 12/28/2023 09:40 PM EST RP Workstation: [id number]   CT Head Wo Contrast Result Date: 12/28/2023 EXAM: CT HEAD WITHOUT CONTRAST 12/28/2023 07:35:00 PM TECHNIQUE: CT of the head was performed without the administration of intravenous  contrast. Automated exposure control, iterative reconstruction, and/or weight based  adjustment of the mA/kV was utilized to reduce the radiation dose to as low as reasonably achievable. COMPARISON: 10/09/2022 CLINICAL HISTORY: Head trauma, minor (Age >= 65y) FINDINGS: BRAIN AND VENTRICLES: No acute hemorrhage. No evidence of acute infarct. No mass effect or midline shift. Mild cerebral volume loss. Mild periventricular white matter disease. Bilateral basal ganglia calcifications. ORBITS: No acute abnormality. SINUSES: Mild mucosal disease within ethmoid air cells. SOFT TISSUES AND SKULL: No acute soft tissue abnormality. No skull fracture. VASCULATURE: Moderate calcific atheromatous disease within carotid siphons. IMPRESSION: 1. No acute intracranial abnormality related to head trauma. Electronically signed by: Oneil Devonshire MD 12/28/2023 08:05 PM EST RP Workstation: [id number]   DG Chest Port 1 View Result Date: 12/28/2023 EXAM: 1 VIEW(S) XRAY OF THE CHEST 12/28/2023 06:55:00 PM COMPARISON: 10/09/2022 CLINICAL HISTORY: dizziness FINDINGS: LINES, TUBES AND DEVICES: Multiple overlying monitor wires. LUNGS AND PLEURA: Pulmonary vascular congestion. Reticulonodular opacities in the left mid and lower lung suspicious for infection. No pulmonary edema. No pleural effusion. No pneumothorax. HEART AND MEDIASTINUM: Mild cardiomegaly. BONES AND SOFT TISSUES: No acute osseous abnormality. IMPRESSION: 1. Left mid and lower lung reticulonodular opacities suspicious for infection. 2. Mild cardiomegaly with pulmonary vascular congestion. Electronically signed by: Norman Gatlin MD 12/28/2023 07:43 PM EST RP Workstation: [id number]     Assessment and Plan: #Complete heart block #Symptomatic bradycardia #Acute on chronic systolic heart failure - No obviously reversible etiologies.  She has mild hyperkalemia.  Her TSH is within normal limits. She takes carvedilol  at home and this is being held. Patient meets criteria for  permanent pacemaker in setting of complete heart block and symptomatic bradycardia.    She has history of reduced LVEF at 30 to 35%.  Given expected high burden of pacing, she meets criteria for CRT implant.  We discussed CRT-P versus CRT-D and patient prefers pacemaker only at her age, which I feel is reasonable.  We will plan for CRT-P implant early next week.   -She will need a temporary pacing wire in the interim given that she is currently transcutaneously pacing and in clinical heart failure.  On-call team is aware and are planning for implant this morning.  I increased the mA on her transcutaneous pacing in order to ensure consistent capture while we await placement of a temporary wire.  Appreciate heart failure team assistance in optimization of her volume status through the weekend in anticipation of device implant early next week.  Signed, Fonda Kitty, MD  12/29/2023 7:41 AM

## 2023-12-29 NOTE — Progress Notes (Signed)
 Patient seen again this morning because she dropped her heart rate in 20, briefly needed transcutaneous pacing   Currently her heart rate is in 40s, still in complete heart block, she is awake, blood pressure is stable,  I started her on dopamine. She will need temporary pacer this morning, keep her n.p.o.

## 2023-12-29 NOTE — Progress Notes (Signed)
 Patient ID: [name], female   DOB: Apr 02, 1945, 78 y.o.   MRN: [id number]     Advanced Heart Failure Rounding Note  Cardiologist: Alm Clay, MD  Chief Complaint: bradycardia Subjective:    Patient is on dopamine 2.5 this morning, she remains in complete heart block with intermittent transcutaneous pacing.  BP stable.  She is awake, reports mild pain from the pacing.   HS-TnI 122 => 136.   Creatinine 2.8 => 2.56, lactate 2 yesterday.    Objective:   Weight Range: 57.8 kg Body mass index is 22.22 kg/m.   Vital Signs:   Temp:  [97.8 F (36.6 C)-98 F (36.7 C)] 97.8 F (36.6 C) (11/08 0417) Pulse Rate:  [21-42] 21 (11/08 0600) Resp:  [12-19] 15 (11/08 0600) BP: (113-185)/(49-99) 153/99 (11/08 0600) SpO2:  [88 %-100 %] 97 % (11/08 0600) Weight:  [56.7 kg-57.8 kg] 57.8 kg (11/08 0417) Last BM Date : 12/28/23 (per pt)  Weight change: Filed Weights   12/28/23 1750 12/29/23 0417  Weight: 56.7 kg 57.8 kg    Intake/Output:   Intake/Output Summary (Last 24 hours) at 12/29/2023 0736 Last data filed at 12/29/2023 0148 Gross per 24 hour  Intake 50 ml  Output --  Net 50 ml      Physical Exam    General:  Well appearing. No resp difficulty HEENT: Normal Neck: Supple. JVP 10-12 cm. Carotids 2+ bilat; no bruits. No lymphadenopathy or thyromegaly appreciated. Cor: PMI nondisplaced. Bradycardic, regular rate & rhythm. No rubs, gallops or murmurs. Lungs: Clear Abdomen: Soft, nontender, nondistended. No hepatosplenomegaly. No bruits or masses. Good bowel sounds. Extremities: No cyanosis, clubbing, rash, edema Neuro: Alert & orientedx3, cranial nerves grossly intact. moves all 4 extremities w/o difficulty. Affect pleasant   Telemetry   CHB with occasional transcutaneous pacing (personally reviewed)  Labs    CBC Recent Labs    12/28/23 1758 12/28/23 1813 12/29/23 0703  WBC 4.8  --  7.5  NEUTROABS 3.1  --   --   HGB 10.3* 10.2* 10.0*  HCT 31.2* 30.0* 30.2*   MCV 94.8  --  94.7  PLT 177  --  161   Basic Metabolic Panel Recent Labs    88/92/74 1758 12/28/23 1813  NA 136 139  K 5.0 5.2*  CL 106 108  CO2 17*  --   GLUCOSE 177* 172*  BUN 81* 81*  CREATININE 2.56* 2.80*  CALCIUM  9.1  --   MG 2.1  --    Liver Function Tests Recent Labs    12/28/23 1758  AST 186*  ALT 229*  ALKPHOS 116  BILITOT 0.8  PROT 6.7  ALBUMIN 3.7   No results for input(s): LIPASE, AMYLASE in the last 72 hours. Cardiac Enzymes No results for input(s): CKTOTAL, CKMB, CKMBINDEX, TROPONINI in the last 72 hours.  BNP: BNP (last 3 results) Recent Labs    04/09/23 1429 12/28/23 1758  BNP 3,400.3* 2,489.8*    ProBNP (last 3 results) No results for input(s): PROBNP in the last 8760 hours.   D-Dimer No results for input(s): DDIMER in the last 72 hours. Hemoglobin A1C Recent Labs    12/29/23 0703  HGBA1C 8.1*   Fasting Lipid Panel No results for input(s): CHOL, HDL, LDLCALC, TRIG, CHOLHDL, LDLDIRECT in the last 72 hours. Thyroid Function Tests No results for input(s): TSH, T4TOTAL, T3FREE, THYROIDAB in the last 72 hours.  Invalid input(s): FREET3  Other results:   Imaging    CT PELVIS WO CONTRAST Result Date: 12/29/2023  EXAM: CT PELVIS, WITHOUT IV CONTRAST 12/28/2023 09:08:00 PM TECHNIQUE: Axial images were acquired through the pelvis without IV contrast. Reformatted images were reviewed. Automated exposure control, iterative reconstruction, and/or weight based adjustment of the mA/kV was utilized to reduce the radiation dose to as low as reasonably achievable. COMPARISON: None available. CLINICAL HISTORY: bilateral hip pain post fall FINDINGS: BONES: A right inferior pubic ramus fracture is noted without significant displacement. No other pelvic fractures are noted. No evidence of acute proximal femoral fracture. JOINTS: No dislocation. SOFT TISSUES: No surrounding soft tissue abnormality is seen. Vascular  calcifications of the aorta are noted. INTRAPELVIC CONTENTS: Visualized bowel is within normal limits. The bladder is within normal limits. IMPRESSION: 1. Right inferior pubic ramus fracture without significant displacement. No other pelvic fractures. 2. No acute proximal femoral fracture. Electronically signed by: Oneil Devonshire MD 12/29/2023 12:51 AM EST RP Workstation: [id number]   CT LUMBAR SPINE WO CONTRAST Result Date: 12/28/2023 CLINICAL DATA:  Fall EXAM: CT LUMBAR SPINE WITHOUT CONTRAST TECHNIQUE: Multidetector CT imaging of the lumbar spine was performed without intravenous contrast administration. Multiplanar CT image reconstructions were also generated. RADIATION DOSE REDUCTION: This exam was performed according to the departmental dose-optimization program which includes automated exposure control, adjustment of the mA and/or kV according to patient size and/or use of iterative reconstruction technique. COMPARISON:  CT 02/27/2022 FINDINGS: Segmentation: 5 lumbar type vertebrae. Alignment: Normal. Vertebrae: Acute or subacute appearing fracture involving the superior endplate of L1 with shallow depression, estimated 10% loss of vertebral body height centrally. No significant retropulsion. Interval Schmorl's node and mild superior endplate deformity at L2. Probable acute fracture involving the superior endplate of L3 with estimated 20% loss of vertebral body height centrally. Minimal 2 mm retropulsion of the upper vertebral body, without significant canal stenosis. Fracture does not involve the pedicle or lamina. Paraspinal and other soft tissues: Suspicion of mild paravertebral edema at L3. Aortic atherosclerosis. Disc levels: At L1-L2, patent disc space. No canal stenosis or high-grade foraminal narrowing. At L2-L3, patent disc space. No high-grade canal stenosis or foraminal narrowing. At L3-L4, patent disc space. Mild ligamentum flavum thickening and facet degenerative changes. No high-grade canal  stenosis. Mild disc bulge. Patent foramen. At L4-L5, disc bulge. Ligamentum flavum thickening and moderate hypertrophic facet degenerative changes result in mild canal stenosis. No high-grade foraminal narrowing. At L5-S1, patent disc space. Diffuse disc bulge. Ligamentum flavum thickening and moderate facet degenerative changes. No high-grade canal stenosis or foraminal narrowing. IMPRESSION: 1. Acute or subacute appearing fracture involving the superior endplate of L1 with estimated 10% loss of vertebral body height centrally. No significant retropulsion. 2. Probable acute fracture involving the superior endplate of L3 with estimated 20% loss of vertebral body height centrally. Minimal 2 mm retropulsion of the upper vertebral body without significant canal stenosis. 3. Interval Schmorl's node and mild superior endplate deformity at L2. 4. Mild degenerative changes of the lumbar spine as described above. 5. Aortic atherosclerosis. Aortic Atherosclerosis (ICD10-I70.0). Electronically Signed   By: Luke Bun M.D.   On: 12/28/2023 21:45   CT CERVICAL SPINE WO CONTRAST Result Date: 12/28/2023 EXAM: CT Cervical Spine Without Contrast 12/28/2023 08:55:00 PM TECHNIQUE: CT of the cervical spine was performed without the administration of intravenous contrast. Multiplanar reformatted images are provided for review. Automated exposure control, iterative reconstruction, and/or weight based adjustment of the mA/kV was utilized to reduce the radiation dose to as low as reasonably achievable. COMPARISON: MRI cervical spine 09/28/2020 CLINICAL HISTORY: Fall FINDINGS: BONES AND ALIGNMENT: No acute  fracture or traumatic malalignment. Chronic degenerative anterolisthesis of C4 on C5. DEGENERATIVE CHANGES: Facet and uncovertebral hypertrophy with severe foraminal stenosis at multiple levels, better characterized on 2022 MRI. Degenerative disc disease is greatest at C5-C6. SOFT TISSUES: No prevertebral soft tissue swelling.  IMPRESSION: 1. No acute abnormality of the cervical spine. Electronically signed by: Gilmore Molt MD 12/28/2023 09:40 PM EST RP Workstation: [id number]   CT Head Wo Contrast Result Date: 12/28/2023 EXAM: CT HEAD WITHOUT CONTRAST 12/28/2023 07:35:00 PM TECHNIQUE: CT of the head was performed without the administration of intravenous contrast. Automated exposure control, iterative reconstruction, and/or weight based adjustment of the mA/kV was utilized to reduce the radiation dose to as low as reasonably achievable. COMPARISON: 10/09/2022 CLINICAL HISTORY: Head trauma, minor (Age >= 65y) FINDINGS: BRAIN AND VENTRICLES: No acute hemorrhage. No evidence of acute infarct. No mass effect or midline shift. Mild cerebral volume loss. Mild periventricular white matter disease. Bilateral basal ganglia calcifications. ORBITS: No acute abnormality. SINUSES: Mild mucosal disease within ethmoid air cells. SOFT TISSUES AND SKULL: No acute soft tissue abnormality. No skull fracture. VASCULATURE: Moderate calcific atheromatous disease within carotid siphons. IMPRESSION: 1. No acute intracranial abnormality related to head trauma. Electronically signed by: Oneil Devonshire MD 12/28/2023 08:05 PM EST RP Workstation: [id number]   DG Chest Port 1 View Result Date: 12/28/2023 EXAM: 1 VIEW(S) XRAY OF THE CHEST 12/28/2023 06:55:00 PM COMPARISON: 10/09/2022 CLINICAL HISTORY: dizziness FINDINGS: LINES, TUBES AND DEVICES: Multiple overlying monitor wires. LUNGS AND PLEURA: Pulmonary vascular congestion. Reticulonodular opacities in the left mid and lower lung suspicious for infection. No pulmonary edema. No pleural effusion. No pneumothorax. HEART AND MEDIASTINUM: Mild cardiomegaly. BONES AND SOFT TISSUES: No acute osseous abnormality. IMPRESSION: 1. Left mid and lower lung reticulonodular opacities suspicious for infection. 2. Mild cardiomegaly with pulmonary vascular congestion. Electronically signed by: Norman Gatlin MD 12/28/2023  07:43 PM EST RP Workstation: [id number]     Medications:     Scheduled Medications:  heparin  injection (subcutaneous)  5,000 Units Subcutaneous Q8H   insulin  aspart  0-6 Units Subcutaneous TID WC    Infusions:  sodium chloride  50 mL/hr at 12/29/23 0547   DOPamine 2.5 mcg/kg/min (12/29/23 0648)    PRN Medications: acetaminophen  **OR** acetaminophen , bisacodyl , fentaNYL  (SUBLIMAZE ) injection, ondansetron  **OR** ondansetron  (ZOFRAN ) IV, mouth rinse   Assessment/Plan   1. Complete heart block: With syncope prior to admission.  Suspect due to conduction system degeneration.  She was on Coreg  3.125 mg bid at home, minimal beta blockade.  Troponin mildly elevated with no trend, doubt ACS.  She had prior cath with nonobstructive CAD.  She remains in CHB on dopamine 2.5 with intermittent transcutaneous pacing.  - Discussed with Dr. Anner, plan TTVP placement today.  She will ultimately need CRT-P vs CRT-D device.  2. Acute on chronic systolic CHF: H/o nonischemic CMP, last echo in 8/24 with EF 30-35%, moderate RV dysfunction.  Now with volume overload and AKI in setting of CHB.   - Dopamine gtt until TTVP.  - Hold Entresto , Coreg , spironolactone , Jardiance  for now.  - Will need diuresis after TTVP placed.  - Echo today.  - Will need CRT-P vs CRT-D.  3. AKI on CKD stage 3: AKI in setting of CHB and syncope/hypotension.  Creatinine 1.7 baseline => 2.8 => 2.56.   CRITICAL CARE Performed by: Ezra Shuck  Total critical care time: 40 minutes  Critical care time was exclusive of separately billable procedures and treating other patients.  Critical care was necessary to treat or prevent imminent  or life-threatening deterioration.  Critical care was time spent personally by me on the following activities: development of treatment plan with patient and/or surrogate as well as nursing, discussions with consultants, evaluation of patient's response to treatment, examination of patient,  obtaining history from patient or surrogate, ordering and performing treatments and interventions, ordering and review of laboratory studies, ordering and review of radiographic studies, pulse oximetry and re-evaluation of patient's condition.     Length of Stay: 1  Ezra Shuck, MD  12/29/2023, 7:36 AM  Advanced Heart Failure Team Pager [phone] (M-F; 7a - 5p)  Please contact [facility] for night-coverage after hours (5p -7a ) and weekends on [email or web address]

## 2023-12-29 NOTE — ED Notes (Signed)
 Patient had a vomit episode

## 2023-12-29 NOTE — ED Notes (Signed)
 EKG completed as requested by admitting provider. Pt having Heart block no distress noticed.

## 2023-12-29 NOTE — H&P (View-Only) (Signed)
 Patient ID: [name], female   DOB: Apr 02, 1945, 78 y.o.   MRN: [id number]     Advanced Heart Failure Rounding Note  Cardiologist: Alm Clay, MD  Chief Complaint: bradycardia Subjective:    Patient is on dopamine 2.5 this morning, she remains in complete heart block with intermittent transcutaneous pacing.  BP stable.  She is awake, reports mild pain from the pacing.   HS-TnI 122 => 136.   Creatinine 2.8 => 2.56, lactate 2 yesterday.    Objective:   Weight Range: 57.8 kg Body mass index is 22.22 kg/m.   Vital Signs:   Temp:  [97.8 F (36.6 C)-98 F (36.7 C)] 97.8 F (36.6 C) (11/08 0417) Pulse Rate:  [21-42] 21 (11/08 0600) Resp:  [12-19] 15 (11/08 0600) BP: (113-185)/(49-99) 153/99 (11/08 0600) SpO2:  [88 %-100 %] 97 % (11/08 0600) Weight:  [56.7 kg-57.8 kg] 57.8 kg (11/08 0417) Last BM Date : 12/28/23 (per pt)  Weight change: Filed Weights   12/28/23 1750 12/29/23 0417  Weight: 56.7 kg 57.8 kg    Intake/Output:   Intake/Output Summary (Last 24 hours) at 12/29/2023 0736 Last data filed at 12/29/2023 0148 Gross per 24 hour  Intake 50 ml  Output --  Net 50 ml      Physical Exam    General:  Well appearing. No resp difficulty HEENT: Normal Neck: Supple. JVP 10-12 cm. Carotids 2+ bilat; no bruits. No lymphadenopathy or thyromegaly appreciated. Cor: PMI nondisplaced. Bradycardic, regular rate & rhythm. No rubs, gallops or murmurs. Lungs: Clear Abdomen: Soft, nontender, nondistended. No hepatosplenomegaly. No bruits or masses. Good bowel sounds. Extremities: No cyanosis, clubbing, rash, edema Neuro: Alert & orientedx3, cranial nerves grossly intact. moves all 4 extremities w/o difficulty. Affect pleasant   Telemetry   CHB with occasional transcutaneous pacing (personally reviewed)  Labs    CBC Recent Labs    12/28/23 1758 12/28/23 1813 12/29/23 0703  WBC 4.8  --  7.5  NEUTROABS 3.1  --   --   HGB 10.3* 10.2* 10.0*  HCT 31.2* 30.0* 30.2*   MCV 94.8  --  94.7  PLT 177  --  161   Basic Metabolic Panel Recent Labs    88/92/74 1758 12/28/23 1813  NA 136 139  K 5.0 5.2*  CL 106 108  CO2 17*  --   GLUCOSE 177* 172*  BUN 81* 81*  CREATININE 2.56* 2.80*  CALCIUM  9.1  --   MG 2.1  --    Liver Function Tests Recent Labs    12/28/23 1758  AST 186*  ALT 229*  ALKPHOS 116  BILITOT 0.8  PROT 6.7  ALBUMIN 3.7   No results for input(s): LIPASE, AMYLASE in the last 72 hours. Cardiac Enzymes No results for input(s): CKTOTAL, CKMB, CKMBINDEX, TROPONINI in the last 72 hours.  BNP: BNP (last 3 results) Recent Labs    04/09/23 1429 12/28/23 1758  BNP 3,400.3* 2,489.8*    ProBNP (last 3 results) No results for input(s): PROBNP in the last 8760 hours.   D-Dimer No results for input(s): DDIMER in the last 72 hours. Hemoglobin A1C Recent Labs    12/29/23 0703  HGBA1C 8.1*   Fasting Lipid Panel No results for input(s): CHOL, HDL, LDLCALC, TRIG, CHOLHDL, LDLDIRECT in the last 72 hours. Thyroid Function Tests No results for input(s): TSH, T4TOTAL, T3FREE, THYROIDAB in the last 72 hours.  Invalid input(s): FREET3  Other results:   Imaging    CT PELVIS WO CONTRAST Result Date: 12/29/2023  EXAM: CT PELVIS, WITHOUT IV CONTRAST 12/28/2023 09:08:00 PM TECHNIQUE: Axial images were acquired through the pelvis without IV contrast. Reformatted images were reviewed. Automated exposure control, iterative reconstruction, and/or weight based adjustment of the mA/kV was utilized to reduce the radiation dose to as low as reasonably achievable. COMPARISON: None available. CLINICAL HISTORY: bilateral hip pain post fall FINDINGS: BONES: A right inferior pubic ramus fracture is noted without significant displacement. No other pelvic fractures are noted. No evidence of acute proximal femoral fracture. JOINTS: No dislocation. SOFT TISSUES: No surrounding soft tissue abnormality is seen. Vascular  calcifications of the aorta are noted. INTRAPELVIC CONTENTS: Visualized bowel is within normal limits. The bladder is within normal limits. IMPRESSION: 1. Right inferior pubic ramus fracture without significant displacement. No other pelvic fractures. 2. No acute proximal femoral fracture. Electronically signed by: Oneil Devonshire MD 12/29/2023 12:51 AM EST RP Workstation: [id number]   CT LUMBAR SPINE WO CONTRAST Result Date: 12/28/2023 CLINICAL DATA:  Fall EXAM: CT LUMBAR SPINE WITHOUT CONTRAST TECHNIQUE: Multidetector CT imaging of the lumbar spine was performed without intravenous contrast administration. Multiplanar CT image reconstructions were also generated. RADIATION DOSE REDUCTION: This exam was performed according to the departmental dose-optimization program which includes automated exposure control, adjustment of the mA and/or kV according to patient size and/or use of iterative reconstruction technique. COMPARISON:  CT 02/27/2022 FINDINGS: Segmentation: 5 lumbar type vertebrae. Alignment: Normal. Vertebrae: Acute or subacute appearing fracture involving the superior endplate of L1 with shallow depression, estimated 10% loss of vertebral body height centrally. No significant retropulsion. Interval Schmorl's node and mild superior endplate deformity at L2. Probable acute fracture involving the superior endplate of L3 with estimated 20% loss of vertebral body height centrally. Minimal 2 mm retropulsion of the upper vertebral body, without significant canal stenosis. Fracture does not involve the pedicle or lamina. Paraspinal and other soft tissues: Suspicion of mild paravertebral edema at L3. Aortic atherosclerosis. Disc levels: At L1-L2, patent disc space. No canal stenosis or high-grade foraminal narrowing. At L2-L3, patent disc space. No high-grade canal stenosis or foraminal narrowing. At L3-L4, patent disc space. Mild ligamentum flavum thickening and facet degenerative changes. No high-grade canal  stenosis. Mild disc bulge. Patent foramen. At L4-L5, disc bulge. Ligamentum flavum thickening and moderate hypertrophic facet degenerative changes result in mild canal stenosis. No high-grade foraminal narrowing. At L5-S1, patent disc space. Diffuse disc bulge. Ligamentum flavum thickening and moderate facet degenerative changes. No high-grade canal stenosis or foraminal narrowing. IMPRESSION: 1. Acute or subacute appearing fracture involving the superior endplate of L1 with estimated 10% loss of vertebral body height centrally. No significant retropulsion. 2. Probable acute fracture involving the superior endplate of L3 with estimated 20% loss of vertebral body height centrally. Minimal 2 mm retropulsion of the upper vertebral body without significant canal stenosis. 3. Interval Schmorl's node and mild superior endplate deformity at L2. 4. Mild degenerative changes of the lumbar spine as described above. 5. Aortic atherosclerosis. Aortic Atherosclerosis (ICD10-I70.0). Electronically Signed   By: Luke Bun M.D.   On: 12/28/2023 21:45   CT CERVICAL SPINE WO CONTRAST Result Date: 12/28/2023 EXAM: CT Cervical Spine Without Contrast 12/28/2023 08:55:00 PM TECHNIQUE: CT of the cervical spine was performed without the administration of intravenous contrast. Multiplanar reformatted images are provided for review. Automated exposure control, iterative reconstruction, and/or weight based adjustment of the mA/kV was utilized to reduce the radiation dose to as low as reasonably achievable. COMPARISON: MRI cervical spine 09/28/2020 CLINICAL HISTORY: Fall FINDINGS: BONES AND ALIGNMENT: No acute  fracture or traumatic malalignment. Chronic degenerative anterolisthesis of C4 on C5. DEGENERATIVE CHANGES: Facet and uncovertebral hypertrophy with severe foraminal stenosis at multiple levels, better characterized on 2022 MRI. Degenerative disc disease is greatest at C5-C6. SOFT TISSUES: No prevertebral soft tissue swelling.  IMPRESSION: 1. No acute abnormality of the cervical spine. Electronically signed by: Gilmore Molt MD 12/28/2023 09:40 PM EST RP Workstation: [id number]   CT Head Wo Contrast Result Date: 12/28/2023 EXAM: CT HEAD WITHOUT CONTRAST 12/28/2023 07:35:00 PM TECHNIQUE: CT of the head was performed without the administration of intravenous contrast. Automated exposure control, iterative reconstruction, and/or weight based adjustment of the mA/kV was utilized to reduce the radiation dose to as low as reasonably achievable. COMPARISON: 10/09/2022 CLINICAL HISTORY: Head trauma, minor (Age >= 65y) FINDINGS: BRAIN AND VENTRICLES: No acute hemorrhage. No evidence of acute infarct. No mass effect or midline shift. Mild cerebral volume loss. Mild periventricular white matter disease. Bilateral basal ganglia calcifications. ORBITS: No acute abnormality. SINUSES: Mild mucosal disease within ethmoid air cells. SOFT TISSUES AND SKULL: No acute soft tissue abnormality. No skull fracture. VASCULATURE: Moderate calcific atheromatous disease within carotid siphons. IMPRESSION: 1. No acute intracranial abnormality related to head trauma. Electronically signed by: Oneil Devonshire MD 12/28/2023 08:05 PM EST RP Workstation: [id number]   DG Chest Port 1 View Result Date: 12/28/2023 EXAM: 1 VIEW(S) XRAY OF THE CHEST 12/28/2023 06:55:00 PM COMPARISON: 10/09/2022 CLINICAL HISTORY: dizziness FINDINGS: LINES, TUBES AND DEVICES: Multiple overlying monitor wires. LUNGS AND PLEURA: Pulmonary vascular congestion. Reticulonodular opacities in the left mid and lower lung suspicious for infection. No pulmonary edema. No pleural effusion. No pneumothorax. HEART AND MEDIASTINUM: Mild cardiomegaly. BONES AND SOFT TISSUES: No acute osseous abnormality. IMPRESSION: 1. Left mid and lower lung reticulonodular opacities suspicious for infection. 2. Mild cardiomegaly with pulmonary vascular congestion. Electronically signed by: Norman Gatlin MD 12/28/2023  07:43 PM EST RP Workstation: [id number]     Medications:     Scheduled Medications:  heparin  injection (subcutaneous)  5,000 Units Subcutaneous Q8H   insulin  aspart  0-6 Units Subcutaneous TID WC    Infusions:  sodium chloride  50 mL/hr at 12/29/23 0547   DOPamine 2.5 mcg/kg/min (12/29/23 0648)    PRN Medications: acetaminophen  **OR** acetaminophen , bisacodyl , fentaNYL  (SUBLIMAZE ) injection, ondansetron  **OR** ondansetron  (ZOFRAN ) IV, mouth rinse   Assessment/Plan   1. Complete heart block: With syncope prior to admission.  Suspect due to conduction system degeneration.  She was on Coreg  3.125 mg bid at home, minimal beta blockade.  Troponin mildly elevated with no trend, doubt ACS.  She had prior cath with nonobstructive CAD.  She remains in CHB on dopamine 2.5 with intermittent transcutaneous pacing.  - Discussed with Dr. Anner, plan TTVP placement today.  She will ultimately need CRT-P vs CRT-D device.  2. Acute on chronic systolic CHF: H/o nonischemic CMP, last echo in 8/24 with EF 30-35%, moderate RV dysfunction.  Now with volume overload and AKI in setting of CHB.   - Dopamine gtt until TTVP.  - Hold Entresto , Coreg , spironolactone , Jardiance  for now.  - Will need diuresis after TTVP placed.  - Echo today.  - Will need CRT-P vs CRT-D.  3. AKI on CKD stage 3: AKI in setting of CHB and syncope/hypotension.  Creatinine 1.7 baseline => 2.8 => 2.56.   CRITICAL CARE Performed by: Ezra Shuck  Total critical care time: 40 minutes  Critical care time was exclusive of separately billable procedures and treating other patients.  Critical care was necessary to treat or prevent imminent  or life-threatening deterioration.  Critical care was time spent personally by me on the following activities: development of treatment plan with patient and/or surrogate as well as nursing, discussions with consultants, evaluation of patient's response to treatment, examination of patient,  obtaining history from patient or surrogate, ordering and performing treatments and interventions, ordering and review of laboratory studies, ordering and review of radiographic studies, pulse oximetry and re-evaluation of patient's condition.     Length of Stay: 1  Ezra Shuck, MD  12/29/2023, 7:36 AM  Advanced Heart Failure Team Pager [phone] (M-F; 7a - 5p)  Please contact [facility] for night-coverage after hours (5p -7a ) and weekends on [email or web address]

## 2023-12-29 NOTE — ED Notes (Signed)
 Pt's HR continues to drop to the low 20's, admitting provider notified since pt is admitted to stepdown and may need a higher level of care. Pt is AO x 4 on the room no distress noticed no c/o pain or SOB. Pt possible to be admitted to ICU bed.

## 2023-12-30 ENCOUNTER — Encounter (HOSPITAL_COMMUNITY): Payer: Self-pay | Admitting: Cardiology

## 2023-12-30 DIAGNOSIS — I442 Atrioventricular block, complete: Secondary | ICD-10-CM | POA: Diagnosis not present

## 2023-12-30 DIAGNOSIS — R001 Bradycardia, unspecified: Secondary | ICD-10-CM | POA: Diagnosis not present

## 2023-12-30 LAB — CBC
HCT: 26.6 % — ABNORMAL LOW (ref 36.0–46.0)
Hemoglobin: 8.8 g/dL — ABNORMAL LOW (ref 12.0–15.0)
MCH: 31.2 pg (ref 26.0–34.0)
MCHC: 33.1 g/dL (ref 30.0–36.0)
MCV: 94.3 fL (ref 80.0–100.0)
Platelets: 149 K/uL — ABNORMAL LOW (ref 150–400)
RBC: 2.82 MIL/uL — ABNORMAL LOW (ref 3.87–5.11)
RDW: 13.8 % (ref 11.5–15.5)
WBC: 5.8 K/uL (ref 4.0–10.5)
nRBC: 0 % (ref 0.0–0.2)

## 2023-12-30 LAB — BASIC METABOLIC PANEL WITH GFR
Anion gap: 12 (ref 5–15)
BUN: 84 mg/dL — ABNORMAL HIGH (ref 8–23)
CO2: 21 mmol/L — ABNORMAL LOW (ref 22–32)
Calcium: 9.1 mg/dL (ref 8.9–10.3)
Chloride: 103 mmol/L (ref 98–111)
Creatinine, Ser: 2.6 mg/dL — ABNORMAL HIGH (ref 0.44–1.00)
GFR, Estimated: 18 mL/min — ABNORMAL LOW (ref >60)
Glucose, Bld: 125 mg/dL — ABNORMAL HIGH (ref 70–99)
Potassium: 5 mmol/L (ref 3.5–5.1)
Sodium: 136 mmol/L (ref 135–145)

## 2023-12-30 LAB — GLUCOSE, CAPILLARY
Glucose-Capillary: 119 mg/dL — ABNORMAL HIGH (ref 70–99)
Glucose-Capillary: 165 mg/dL — ABNORMAL HIGH (ref 70–99)
Glucose-Capillary: 190 mg/dL — ABNORMAL HIGH (ref 70–99)
Glucose-Capillary: 205 mg/dL — ABNORMAL HIGH (ref 70–99)

## 2023-12-30 LAB — SURGICAL PCR SCREEN
MRSA, PCR: NEGATIVE
Staphylococcus aureus: NEGATIVE

## 2023-12-30 MED ORDER — OXYCODONE HCL 5 MG PO TABS
5.0000 mg | ORAL_TABLET | Freq: Four times a day (QID) | ORAL | Status: DC | PRN
  Administered 2023-12-30 – 2023-12-31 (×2): 5 mg via ORAL
  Filled 2023-12-30 (×3): qty 1

## 2023-12-30 MED ORDER — SODIUM CHLORIDE 0.9 % IV SOLN
80.0000 mg | INTRAVENOUS | Status: DC
  Filled 2023-12-30: qty 2

## 2023-12-30 MED ORDER — SENNA 8.6 MG PO TABS
1.0000 | ORAL_TABLET | Freq: Every day | ORAL | Status: DC
  Administered 2023-12-30 – 2024-01-05 (×4): 8.6 mg via ORAL
  Filled 2023-12-30 (×5): qty 1

## 2023-12-30 MED ORDER — SODIUM CHLORIDE 0.9 % IV SOLN: INTRAVENOUS | Status: DC

## 2023-12-30 MED ORDER — INSULIN ASPART 100 UNIT/ML IJ SOLN
0.0000 [IU] | INTRAMUSCULAR | Status: DC
  Administered 2023-12-30: 5 [IU] via SUBCUTANEOUS
  Administered 2023-12-31 (×2): 3 [IU] via SUBCUTANEOUS
  Administered 2023-12-31: 2 [IU] via SUBCUTANEOUS
  Administered 2024-01-01 (×2): 3 [IU] via SUBCUTANEOUS
  Administered 2024-01-01: 5 [IU] via SUBCUTANEOUS
  Filled 2023-12-30: qty 2
  Filled 2023-12-30: qty 3
  Filled 2023-12-30 (×2): qty 5
  Filled 2023-12-30 (×3): qty 3

## 2023-12-30 MED ORDER — CEFAZOLIN SODIUM-DEXTROSE 2-4 GM/100ML-% IV SOLN: 2.0000 g | INTRAVENOUS | Status: AC

## 2023-12-30 MED ORDER — FUROSEMIDE 10 MG/ML IJ SOLN
80.0000 mg | Freq: Once | INTRAMUSCULAR | Status: AC
  Administered 2023-12-30: 80 mg via INTRAVENOUS
  Filled 2023-12-30: qty 8

## 2023-12-30 MED ORDER — CHLORHEXIDINE GLUCONATE 4 % EX SOLN: 60.0000 mL | Freq: Once | CUTANEOUS | Status: DC

## 2023-12-30 MED ORDER — HYDRALAZINE HCL 25 MG PO TABS
25.0000 mg | ORAL_TABLET | Freq: Three times a day (TID) | ORAL | Status: DC
  Administered 2023-12-30 – 2024-01-01 (×5): 25 mg via ORAL
  Filled 2023-12-30 (×5): qty 1

## 2023-12-30 MED ORDER — ISOSORBIDE MONONITRATE ER 30 MG PO TB24
30.0000 mg | ORAL_TABLET | Freq: Every day | ORAL | Status: DC
  Administered 2023-12-30 – 2023-12-31 (×2): 30 mg via ORAL
  Filled 2023-12-30 (×2): qty 1

## 2023-12-30 MED ORDER — CHLORHEXIDINE GLUCONATE 4 % EX SOLN
60.0000 mL | Freq: Once | CUTANEOUS | Status: AC
  Administered 2023-12-30: 4 via TOPICAL
  Filled 2023-12-30: qty 15
  Filled 2023-12-30: qty 60

## 2023-12-30 MED ORDER — POLYETHYLENE GLYCOL 3350 17 G PO PACK
17.0000 g | PACK | Freq: Every day | ORAL | Status: DC
  Administered 2023-12-30 – 2024-01-05 (×4): 17 g via ORAL
  Filled 2023-12-30 (×5): qty 1

## 2023-12-30 NOTE — Progress Notes (Signed)
 Progress Note  Patient Name: [name] Date of Encounter: 12/30/2023 [facility] Cardiologist: Alm Clay, MD   Interval Summary   Right internal jugular temporary pacing wire placed yesterday.  Patient reports marked improvement in symptoms.  No acute overnight events.  No new or acute complaints today.  Vital Signs Vitals:   12/30/23 0500 12/30/23 0530 12/30/23 0630 12/30/23 0700  BP: (!) 152/76 (!) 150/69 (!) 149/74 (!) 152/73  Pulse: 70 69 69 69  Resp: 13 (!) 7 16 11   Temp:      TempSrc:      SpO2: 96% 97% 98% 96%  Weight:      Height:        Intake/Output Summary (Last 24 hours) at 12/30/2023 0751 Last data filed at 12/30/2023 0700 Gross per 24 hour  Intake 1364.17 ml  Output 1250 ml  Net 114.17 ml      12/29/2023    4:17 AM 12/28/2023    5:50 PM 04/09/2023    1:58 PM  Last 3 Weights  Weight (lbs) 127 lb 6.8 oz 125 lb 117 lb 9.6 oz  Weight (kg) 57.8 kg 56.7 kg 53.343 kg      Telemetry/ECG  VP rhythm- Personally Reviewed  Physical Exam  GEN: No acute distress.   Neck: RIJ temp wire Cardiac: Normal rate, regular rhythm Respiratory: Clear to auscultation bilaterally. GI: Soft, nontender, non-distended  MS: No edema  Echo 12/29/23  1. Left ventricular ejection fraction, by estimation, is 50 to 55%. The  left ventricle has low normal function. The left ventricle demonstrates  regional wall motion abnormalities (see scoring diagram/findings for  description). There is mild left  ventricular hypertrophy. Left ventricular diastolic parameters are  consistent with Grade III diastolic dysfunction (restrictive). Elevated  left atrial pressure.   2. Right ventricular systolic function is normal. The right ventricular  size is normal. There is mildly elevated pulmonary artery systolic  pressure.   3. Left atrial size was moderately dilated.   4. The mitral valve is abnormal. Mild mitral valve regurgitation. No  evidence of mitral stenosis.   5. The  tricuspid valve is abnormal. Tricuspid valve regurgitation is  moderate.   6. The aortic valve is tricuspid. Aortic valve regurgitation is not  visualized. No aortic stenosis is present.   7. The inferior vena cava is normal in size with greater than 50%  respiratory variability, suggesting right atrial pressure of 3 mmHg.   8. Small PFO with mild left to right shunting.    Assessment & Plan  #Complete heart block #Symptomatic bradycardia #Heart failure with recovered EF - No obviously reversible etiologies.  She has mild hyperkalemia.  Her TSH is within normal limits. She takes carvedilol  at home and this is being held. Patient meets criteria for permanent pacemaker in setting of complete heart block and symptomatic bradycardia.    She has history of reduced LVEF at 30 to 35%, although her echo yesterday showed recovery of LV systolic function with EF of 50 to 55%.  Would plan for dual-chamber pacemaker with left bundle branch area pacing lead, but given expected high burden of pacing and history of low LVEF, would implant CRT-P if unable to achieve left-sided conduction system capture / narrow QRS with LBBAP lead alone. Explained risks, benefits, and alternatives to pacemaker implantation, including but not limited to bleeding, infection, damage to heart or lungs, heart attack, stroke, or death.  Pt verbalized understanding and agrees to proceed if indicated. Will try to add  on scheduled for tomorrow, pending availability. - Leave temporary pacemaker in place until permanent device can be implanted.  - Appreciate heart failure team assistance in optimization of her volume status through the weekend in anticipation of device implant early next week.  Signed, Fonda Kitty, MD

## 2023-12-30 NOTE — Progress Notes (Signed)
 eLink Physician-Brief Progress Note Patient Name: [name] DOB: Aug 11, 1945 MRN: [id number]   Date of Service  12/30/2023  HPI/Events of Note  Notified of hyperglycemia with glucose at 205.  Pt is a diabetic.  He will be NPO at midnight for pacemaker placement in the AM.   eICU Interventions  Insulin  sliding scale ordered q4hrs starting now.      Intervention Category Intermediate Interventions: Hyperglycemia - evaluation and treatment  [name] 12/30/2023, 10:12 PM  5:39 AM Received request to renew NS at 10cc/hr for TVP sheath.   Plan> NS at 10cc/hr renewed.

## 2023-12-30 NOTE — Progress Notes (Signed)
 Patient ID: [name], female   DOB: 08-21-1945, 78 y.o.   MRN: [id number]     Advanced Heart Failure Rounding Note  Cardiologist: Alm Clay, MD  Chief Complaint: bradycardia Subjective:    Feeling much better with transvenous pacing. BP mildly elevated, able to lay flat in bed.    Objective:   Weight Range: 57.8 kg Body mass index is 22.22 kg/m.   Vital Signs:   Temp:  [98.1 F (36.7 C)-98.6 F (37 C)] 98.1 F (36.7 C) (11/09 1142) Pulse Rate:  [68-73] 69 (11/09 1400) Resp:  [0-27] 10 (11/09 1400) BP: (91-166)/(56-133) 147/72 (11/09 1400) SpO2:  [93 %-100 %] 98 % (11/09 1400) Last BM Date :  (PTA)  Weight change: Filed Weights   12/28/23 1750 12/29/23 0417  Weight: 56.7 kg 57.8 kg    Intake/Output:   Intake/Output Summary (Last 24 hours) at 12/30/2023 1545 Last data filed at 12/30/2023 1400 Gross per 24 hour  Intake 1168.5 ml  Output 1100 ml  Net 68.5 ml      Physical Exam    GENERAL: NAD, well appearing PULM:  Normal work of breathing, CTAB CARDIAC:  JVP: flat         Normal rate with regular rhythm. No murmurs, rubs or gallops.  Trace edema. Warm and well perfused extremities. R internal jugular temp pacer ABDOMEN: Soft, non-tender, non-distended. NEUROLOGIC: Patient is oriented x3 with no focal or lateralizing neurologic deficits.     Telemetry   VVI at 70bpm  Labs    CBC Recent Labs    12/28/23 1758 12/28/23 1813 12/29/23 0703 12/30/23 0212  WBC 4.8  --  7.5 5.8  NEUTROABS 3.1  --   --   --   HGB 10.3*   < > 10.0* 8.8*  HCT 31.2*   < > 30.2* 26.6*  MCV 94.8  --  94.7 94.3  PLT 177  --  161 149*   < > = values in this interval not displayed.   Basic Metabolic Panel Recent Labs    88/92/74 1758 12/28/23 1813 12/29/23 2057 12/30/23 0212  NA 136   < > 135 136  K 5.0   < > 4.7 5.0  CL 106   < > 104 103  CO2 17*   < > 19* 21*  GLUCOSE 177*   < > 157* 125*  BUN 81*   < > 87* 84*  CREATININE 2.56*   < > 2.91* 2.60*  CALCIUM   9.1   < > 9.1 9.1  MG 2.1  --   --   --    < > = values in this interval not displayed.   Liver Function Tests Recent Labs    12/28/23 1758 12/29/23 0703  AST 186* 93*  ALT 229* 175*  ALKPHOS 116 95  BILITOT 0.8 1.0  PROT 6.7 6.3*  ALBUMIN 3.7 3.5      Medications:     Scheduled Medications:  Chlorhexidine  Gluconate Cloth  6 each Topical Daily   heparin  injection (subcutaneous)  5,000 Units Subcutaneous Q8H   hydrALAZINE  25 mg Oral Q8H   insulin  aspart  0-6 Units Subcutaneous TID WC   isosorbide mononitrate  30 mg Oral Daily   sodium chloride  flush  10-40 mL Intracatheter Q12H    Infusions:  sodium chloride  10 mL/hr at 12/30/23 1400    PRN Medications: acetaminophen  **OR** acetaminophen , bisacodyl , fentaNYL  (SUBLIMAZE ) injection, ondansetron  **OR** ondansetron  (ZOFRAN ) IV, mouth rinse, oxyCODONE , sodium chloride  flush   Assessment/Plan  Complete heart block: With syncope prior to admission.  Suspect due to conduction system degeneration.  She was on Coreg  3.125 mg bid at home, minimal beta blockade.  Troponin mildly elevated with no trend, doubt ACS.  She had prior cath with nonobstructive CAD.  TVP placed 11/8 - CRT-P tomorrow with EP    Acute on chronic systolic CHF: H/o nonischemic CMP, last echo in 8/24 with EF 30-35%, moderate RV dysfunction.  Now with volume overload and AKI in setting of CHB.   - Stop dopamine, holding entresto , coreg , MRA, and SGLT2 currently - Start hydralazine 25mg  TID, imdur 30 for BP support - IV lasix  80mg  x1   AKI on CKD stage 3: AKI in setting of CHB and syncope/hypotension.  Creatinine 1.7 and up to 2.6, diuresis today

## 2023-12-30 NOTE — H&P (Signed)
   NAME:  HARLIE BUENING, MRN:  [id number], DOB:  06-Mar-1945, LOS: 2 ADMISSION DATE:  12/28/2023 History of Present Illness:  This is a case of an 78 year old female patient with a past medical history of hypertension, CKD with baseline creatinine of 1.8 mg/dL, nonischemic cardiomyopathy with a EF 30%, type 2 diabetes mellitus presenting to [facility] on 11/07 for dizziness lightheadedness and nausea.  In the ED she was noted to be bradycardic.  EKG with complete heart block.  Heart rate as low as 30.  However patient mentating well.  Hemodynamically stable.  Labs without any significant electrolyte abnormalities.  AKI noted with a creatinine of 2.56 mg/dL from a baseline of 1.7 mg/dL.  Elevated BNP at 2500.  Troponin flat in the 120s/130s.  Mildly elevated AST/ALT at 229 and 186.  Lactic acid 2.0.  Cardiology evaluated and given hemodynamic stability no intervention at this time.  Started on glucagon.  She is being admitted to the ICU for close monitoring.  Pertinent  Medical History  As above  Significant Hospital Events: Including procedures, antibiotic start and stop dates in addition to other pertinent events   12/29/2023 -admitted to the ICU.  Interval history:   She complains of neck pain today.  Objective    Blood pressure (!) 147/72, pulse 69, temperature 98 F (36.7 C), temperature source Oral, resp. rate 10, height 5' 3.5 (1.613 m), weight 57.8 kg, SpO2 98%.        Intake/Output Summary (Last 24 hours) at 12/30/2023 1552 Last data filed at 12/30/2023 1400 Gross per 24 hour  Intake 1168.5 ml  Output 1100 ml  Net 68.5 ml   Filed Weights   12/28/23 1750 12/29/23 0417  Weight: 56.7 kg 57.8 kg    Examination: General: elderly woman lying in bed in NAD HENT: [place]/AT Lungs: CTAB Cardiovascular: S1S2, paced rhythm, JVD Abdomen: soft, NT Extremities: warm, no significant edema  K+5.0 Bicarb 21 BUN 84 Cr 2.6 WBC 5.8 H/H 8.8/26.6 Platelets 149  Echo:  LVEF50-55%, mild LVH, G3DD. Mildly elevated PASP. Moderate TR, mild MR. Small PFO with mild L>R shunt.   Resolved problem list:  Lactic acidosis  Assessment and Plan   Acute HFpEF due to CHB; symptomatic bradycardia Previously NICM with EF 30-35%; recovery with GDMT -TVP per cardiology -planning for PPM; potentially tomorrow -NPO past midnight -lasix  today; monitor electrolytes -GDMT limited by renal failure and hypotension -Imdur +hydralazine -oxycodone  PRN for pain from TVP -no OOB mobility -hold PTA jardiance , coreg , Entresto , spiro -con't statin  AKI on CKD 3B; UOP improving with diuresis -strict I/O -renally dose meds, avoid nephrotoxic meds -lasix   -daily BMP -hold Entresto , spiro  Shock liver -treat heart failure   Type II DM  -SSI PRN- minimal requirements -goal BG <180 -hold PTA jardiance    Critical care time: 31 min      Leita SHAUNNA Gaskins, DO 12/30/23 4:12 PM [facility]  For contact information, see Amion. If no response to pager, please call PCCM consult pager. After hours, 7PM- 7AM, please call Elink.

## 2023-12-30 NOTE — Plan of Care (Signed)
  Problem: Coping: Goal: Ability to adjust to condition or change in health will improve Outcome: Progressing   Problem: Health Behavior/Discharge Planning: Goal: Ability to manage health-related needs will improve Outcome: Progressing   Problem: Metabolic: Goal: Ability to maintain appropriate glucose levels will improve Outcome: Progressing   Problem: Nutritional: Goal: Maintenance of adequate nutrition will improve Outcome: Progressing   Problem: Education: Goal: Knowledge of General Education information will improve Description: Including pain rating scale, medication(s)/side effects and non-pharmacologic comfort measures Outcome: Progressing   Problem: Clinical Measurements: Goal: Will remain free from infection Outcome: Progressing

## 2023-12-31 ENCOUNTER — Inpatient Hospital Stay (HOSPITAL_COMMUNITY)
Admission: EM | Disposition: A | Discharge status: Rehabilitation Facility | Payer: Self-pay | Source: Home / Self Care | Attending: Internal Medicine

## 2023-12-31 DIAGNOSIS — E1122 Type 2 diabetes mellitus with diabetic chronic kidney disease: Secondary | ICD-10-CM

## 2023-12-31 DIAGNOSIS — I5031 Acute diastolic (congestive) heart failure: Secondary | ICD-10-CM

## 2023-12-31 DIAGNOSIS — I442 Atrioventricular block, complete: Secondary | ICD-10-CM

## 2023-12-31 DIAGNOSIS — N183 Chronic kidney disease, stage 3 unspecified: Secondary | ICD-10-CM

## 2023-12-31 DIAGNOSIS — I5023 Acute on chronic systolic (congestive) heart failure: Secondary | ICD-10-CM | POA: Diagnosis not present

## 2023-12-31 HISTORY — PX: PACEMAKER IMPLANT: EP1218

## 2023-12-31 LAB — BASIC METABOLIC PANEL WITH GFR
Anion gap: 13 (ref 5–15)
BUN: 72 mg/dL — ABNORMAL HIGH (ref 8–23)
CO2: 18 mmol/L — ABNORMAL LOW (ref 22–32)
Calcium: 9 mg/dL (ref 8.9–10.3)
Chloride: 105 mmol/L (ref 98–111)
Creatinine, Ser: 2.06 mg/dL — ABNORMAL HIGH (ref 0.44–1.00)
GFR, Estimated: 24 mL/min — ABNORMAL LOW (ref >60)
Glucose, Bld: 145 mg/dL — ABNORMAL HIGH (ref 70–99)
Potassium: 5 mmol/L (ref 3.5–5.1)
Sodium: 136 mmol/L (ref 135–145)

## 2023-12-31 LAB — GLUCOSE, CAPILLARY
Glucose-Capillary: 113 mg/dL — ABNORMAL HIGH (ref 70–99)
Glucose-Capillary: 115 mg/dL — ABNORMAL HIGH (ref 70–99)
Glucose-Capillary: 144 mg/dL — ABNORMAL HIGH (ref 70–99)
Glucose-Capillary: 152 mg/dL — ABNORMAL HIGH (ref 70–99)
Glucose-Capillary: 163 mg/dL — ABNORMAL HIGH (ref 70–99)
Glucose-Capillary: 175 mg/dL — ABNORMAL HIGH (ref 70–99)

## 2023-12-31 LAB — CBC
HCT: 28 % — ABNORMAL LOW (ref 36.0–46.0)
Hemoglobin: 9.4 g/dL — ABNORMAL LOW (ref 12.0–15.0)
MCH: 31.2 pg (ref 26.0–34.0)
MCHC: 33.6 g/dL (ref 30.0–36.0)
MCV: 93 fL (ref 80.0–100.0)
Platelets: 160 K/uL (ref 150–400)
RBC: 3.01 MIL/uL — ABNORMAL LOW (ref 3.87–5.11)
RDW: 13.8 % (ref 11.5–15.5)
WBC: 6 K/uL (ref 4.0–10.5)
nRBC: 0 % (ref 0.0–0.2)

## 2023-12-31 LAB — MAGNESIUM: Magnesium: 1.9 mg/dL (ref 1.7–2.4)

## 2023-12-31 SURGERY — PACEMAKER IMPLANT

## 2023-12-31 MED ORDER — FENTANYL CITRATE (PF) 100 MCG/2ML IJ SOLN
INTRAMUSCULAR | Status: DC | PRN
  Administered 2023-12-31: 50 ug via INTRAVENOUS
  Administered 2023-12-31: 12.5 ug via INTRAVENOUS
  Administered 2023-12-31: 25 ug via INTRAVENOUS

## 2023-12-31 MED ORDER — FENTANYL CITRATE (PF) 100 MCG/2ML IJ SOLN
INTRAMUSCULAR | Status: AC
  Filled 2023-12-31: qty 2

## 2023-12-31 MED ORDER — LIDOCAINE 5 % EX PTCH: 1.0000 | MEDICATED_PATCH | Freq: Every day | CUTANEOUS | Status: DC | PRN

## 2023-12-31 MED ORDER — MIDAZOLAM HCL 5 MG/5ML IJ SOLN
INTRAMUSCULAR | Status: DC | PRN
  Administered 2023-12-31 (×2): 1 mg via INTRAVENOUS

## 2023-12-31 MED ORDER — LIDOCAINE HCL (PF) 1 % IJ SOLN
INTRAMUSCULAR | Status: AC
  Filled 2023-12-31: qty 60

## 2023-12-31 MED ORDER — ACETAMINOPHEN 325 MG PO TABS: 325.0000 mg | ORAL_TABLET | ORAL | Status: DC | PRN

## 2023-12-31 MED ORDER — MAGNESIUM SULFATE IN D5W 1-5 GM/100ML-% IV SOLN
1.0000 g | Freq: Once | INTRAVENOUS | Status: AC
  Administered 2023-12-31: 1 g via INTRAVENOUS
  Filled 2023-12-31: qty 100

## 2023-12-31 MED ORDER — MIDAZOLAM HCL 2 MG/2ML IJ SOLN
INTRAMUSCULAR | Status: AC
Start: 2023-12-31 — End: 2023-12-31
  Filled 2023-12-31: qty 2

## 2023-12-31 MED ORDER — LIDOCAINE HCL (PF) 1 % IJ SOLN
INTRAMUSCULAR | Status: DC | PRN
  Administered 2023-12-31: 30 mL

## 2023-12-31 MED ORDER — SODIUM CHLORIDE 0.9 % IV SOLN: INTRAVENOUS | Status: AC

## 2023-12-31 MED ORDER — SODIUM CHLORIDE 0.9 % IV SOLN
INTRAVENOUS | Status: AC
  Filled 2023-12-31: qty 2

## 2023-12-31 MED ORDER — CEFAZOLIN SODIUM-DEXTROSE 2-4 GM/100ML-% IV SOLN
INTRAVENOUS | Status: AC
  Administered 2023-12-31: 2 g via INTRAVENOUS
  Filled 2023-12-31: qty 100

## 2023-12-31 MED ORDER — HEPARIN (PORCINE) IN NACL 1000-0.9 UT/500ML-% IV SOLN
INTRAVENOUS | Status: DC | PRN
  Administered 2023-12-31 (×2): 500 mL

## 2023-12-31 MED ORDER — IOHEXOL 350 MG/ML SOLN
INTRAVENOUS | Status: DC | PRN
  Administered 2023-12-31: 10 mL
  Administered 2023-12-31 (×2): 15 mL
  Administered 2023-12-31: 20 mL

## 2023-12-31 SURGICAL SUPPLY — 19 items
BALLOON ATTAIN 80 (BALLOONS) IMPLANT
CABLE SURGICAL S-101-97-12 (CABLE) ×1 IMPLANT
CATH ATTAIN COM SURV 6250V-EH (CATHETERS) IMPLANT
CATH ATTAIN SEL SURV 6248V-130 (CATHETERS) IMPLANT
CATH ATTAIN SEL SURV 6248V-90 (CATHETERS) IMPLANT
CATH RIGHTSITE C315HIS02 (CATHETERS) IMPLANT
INTRO WORLEY LAT VEIN 5.5 (INTRODUCER) IMPLANT
IPG PACE AZUR XT DR MRI W1DR01 (Pacemaker) IMPLANT
LEAD CAPSURE NOVUS 5076-52CM (Lead) IMPLANT
LEAD SELECT SECURE 3830 383069 (Lead) IMPLANT
PAD DEFIB RADIO PHYSIO CONN (PAD) ×1 IMPLANT
SHEATH 7FR PRELUDE SNAP 13 (SHEATH) IMPLANT
SHEATH 9FR PRELUDE SNAP 13 (SHEATH) IMPLANT
SHEATH PROBE COVER 6X72 (BAG) IMPLANT
SLITTER 6232ADJ (MISCELLANEOUS) IMPLANT
TRAY PACEMAKER INSERTION (PACKS) ×1 IMPLANT
WIRE ACUITY WHISPER EDS 4648 (WIRE) IMPLANT
WIRE HI TORQ VERSACORE-J 145CM (WIRE) IMPLANT
WIRE MAILMAN 182CM (WIRE) IMPLANT

## 2023-12-31 NOTE — Progress Notes (Signed)
 Patient ID: [name], female   DOB: [birth year], 78 y.o.   MRN: [id number]     Advanced Heart Failure Rounding Note  Cardiologist: Alm Clay, MD  Chief Complaint: bradycardia Subjective:    Feels ok this morning. Daughter at bedside. Complaining of neck pain, feeling cold and slight nausea. Denies CP or SOB.   Objective:   Weight Range: 57.8 kg Body mass index is 22.22 kg/m.   Vital Signs:   Temp:  [97.4 F (36.3 C)-98.2 F (36.8 C)] 98.2 F (36.8 C) (11/10 0740) Pulse Rate:  [68-71] 70 (11/10 0900) Resp:  [0-26] 16 (11/10 0900) BP: (115-165)/(53-131) 152/71 (11/10 0900) SpO2:  [93 %-100 %] 97 % (11/10 0900) Last BM Date : 12/30/23  Weight change: Filed Weights   12/28/23 1750 12/29/23 0417  Weight: 56.7 kg 57.8 kg    Intake/Output:   Intake/Output Summary (Last 24 hours) at 12/31/2023 1057 Last data filed at 12/31/2023 0900 Gross per 24 hour  Intake 1392.07 ml  Output 1500 ml  Net -107.93 ml     Physical Exam   General:  elderly / frail appearing.  No respiratory difficulty Neck: JVD ~6 cm. RIJ TVP Cor: Regular rate & rhythm. No murmurs. Lungs: clear Extremities: No edema.  Neuro: alert & oriented x 3. Affect pleasant.   Telemetry   VVI at 70bpm (Personally reviewed)    Labs    CBC Recent Labs    12/28/23 1758 12/28/23 1813 12/30/23 0212 12/31/23 0913  WBC 4.8   < > 5.8 6.0  NEUTROABS 3.1  --   --   --   HGB 10.3*   < > 8.8* 9.4*  HCT 31.2*   < > 26.6* 28.0*  MCV 94.8   < > 94.3 93.0  PLT 177   < > 149* 160   < > = values in this interval not displayed.   Basic Metabolic Panel Recent Labs    88/92/74 1758 12/28/23 1813 12/29/23 2057 12/30/23 0212  NA 136   < > 135 136  K 5.0   < > 4.7 5.0  CL 106   < > 104 103  CO2 17*   < > 19* 21*  GLUCOSE 177*   < > 157* 125*  BUN 81*   < > 87* 84*  CREATININE 2.56*   < > 2.91* 2.60*  CALCIUM  9.1   < > 9.1 9.1  MG 2.1  --   --   --    < > = values in this interval not displayed.    Liver Function Tests Recent Labs    12/28/23 1758 12/29/23 0703  AST 186* 93*  ALT 229* 175*  ALKPHOS 116 95  BILITOT 0.8 1.0  PROT 6.7 6.3*  ALBUMIN 3.7 3.5      Medications:     Scheduled Medications:  chlorhexidine   60 mL Topical Once   Chlorhexidine  Gluconate Cloth  6 each Topical Daily   gentamicin (GARAMYCIN) 80 mg in sodium chloride  0.9 % 500 mL irrigation  80 mg Irrigation On Call   heparin  injection (subcutaneous)  5,000 Units Subcutaneous Q8H   hydrALAZINE  25 mg Oral Q8H   insulin  aspart  0-15 Units Subcutaneous Q4H   isosorbide mononitrate  30 mg Oral Daily   polyethylene glycol  17 g Oral Daily   senna  1 tablet Oral Daily   sodium chloride  flush  10-40 mL Intracatheter Q12H    Infusions:  sodium chloride  Stopped (12/31/23 0547)   sodium  chloride 10 mL/hr at 12/31/23 0900    ceFAZolin  (ANCEF ) IV Stopped (12/31/23 0540)    PRN Medications: acetaminophen  **OR** acetaminophen , bisacodyl , fentaNYL  (SUBLIMAZE ) injection, lidocaine , ondansetron  **OR** ondansetron  (ZOFRAN ) IV, mouth rinse, oxyCODONE , sodium chloride  flush   Assessment/Plan   Complete heart block: With syncope prior to admission.  Suspect due to conduction system degeneration.  She was on Coreg  3.125 mg bid at home, minimal beta blockade.  Troponin mildly elevated with no trend, doubt ACS.  She had prior cath with nonobstructive CAD.  TVP placed 11/8 - CRT-P today with EP   Acute on chronic systolic CHF: H/o nonischemic CMP, last echo in 8/24 with EF 30-35%, moderate RV dysfunction.  Now with volume overload and AKI in setting of CHB.   - Holding entresto , coreg , MRA, and SGLT2 currently. May not be able to go back on Entresto  as patient assistance was stopped.  - Continue hydralazine 25mg  TID, imdur 30 for BP support. Will increase post procedure today.  - Does not appear to need additional diuretics today.   AKI on CKD stage 3: AKI in setting of CHB and syncope/hypotension. SCR up to 2.6.  BMET pending today.   Beckey LITTIE Coe AGACNP-BC  Advanced Heart Failure Team  12/31/23

## 2023-12-31 NOTE — Progress Notes (Signed)
  Patient Name: [name] Date of Encounter: 12/31/2023  Primary Cardiologist: Alm Clay, MD Electrophysiologist: None  Interval Summary   The patient reports her neck is sore after being pulled up in bed this am. She states it is better than when it first happened but she feels sore.  Her daughter reports chronic neck / back issues / pain.   Vital Signs    Vitals:   12/31/23 0540 12/31/23 0600 12/31/23 0630 12/31/23 0700  BP: (!) 164/79 (!) 150/71 (!) 150/72 (!) 158/71  Pulse:  69 69 69  Resp:  17 20 16   Temp:      TempSrc:      SpO2:  96% 97% 97%  Weight:      Height:        Intake/Output Summary (Last 24 hours) at 12/31/2023 0725 Last data filed at 12/31/2023 0700 Gross per 24 hour  Intake 1162.07 ml  Output 1500 ml  Net -337.93 ml   Filed Weights   12/28/23 1750 12/29/23 0417  Weight: 56.7 kg 57.8 kg    Physical Exam    GEN- NAD, Alert and oriented  Lungs- Clear to ausculation bilaterally, normal work of breathing Cardiac- Regular rate and rhythm, no murmurs, rubs or gallops GI- soft, NT, ND, + BS Extremities- no clubbing or cyanosis. No edema  Telemetry    VP 70's, brief episodes of NSVT  (personally reviewed)   Hospital Course    [name] is a 78 y.o. female with PMH of NICM, HTN, DM II admitted 12/28/23 after a mechanical fall, lightheadedness / dizziness and nausea. She was found to be in CHB.  TVP was placed. Pending PPM.   Assessment & Plan    Complete Heart Block  Symptomatic Bradycardia  -no reversible causes identified > TSH wnl, mild hyperkalemia  -coreg  held on admit  -plan for dual chamber PPM with LBA pacing, but if unable to achieve left-sided conduction system capture, transition to CRT-P with prior LVEF 30-35% (now recovered to 50-55%) & anticipated high pacing burden -continue TVP, VVI 70 bpm, 5mA > loss of capture at 1mA with CHB underlying -clear liquid breakfast, then NPO for planned PPM     For questions or updates,  please contact [facility] Please consult [email or web address] for contact info under     Signed, Daphne Barrack, NP-C, AGACNP-BC [facility] - Electrophysiology  12/31/2023, 7:25 AM

## 2023-12-31 NOTE — H&P (View-Only) (Signed)
  Patient Name: [name] Date of Encounter: 12/31/2023  Primary Cardiologist: Alm Clay, MD Electrophysiologist: None  Interval Summary   The patient reports her neck is sore after being pulled up in bed this am. She states it is better than when it first happened but she feels sore.  Her daughter reports chronic neck / back issues / pain.   Vital Signs    Vitals:   12/31/23 0540 12/31/23 0600 12/31/23 0630 12/31/23 0700  BP: (!) 164/79 (!) 150/71 (!) 150/72 (!) 158/71  Pulse:  69 69 69  Resp:  17 20 16   Temp:      TempSrc:      SpO2:  96% 97% 97%  Weight:      Height:        Intake/Output Summary (Last 24 hours) at 12/31/2023 0725 Last data filed at 12/31/2023 0700 Gross per 24 hour  Intake 1162.07 ml  Output 1500 ml  Net -337.93 ml   Filed Weights   12/28/23 1750 12/29/23 0417  Weight: 56.7 kg 57.8 kg    Physical Exam    GEN- NAD, Alert and oriented  Lungs- Clear to ausculation bilaterally, normal work of breathing Cardiac- Regular rate and rhythm, no murmurs, rubs or gallops GI- soft, NT, ND, + BS Extremities- no clubbing or cyanosis. No edema  Telemetry    VP 70's, brief episodes of NSVT  (personally reviewed)   Hospital Course    [name] is a 78 y.o. female with PMH of NICM, HTN, DM II admitted 12/28/23 after a mechanical fall, lightheadedness / dizziness and nausea. She was found to be in CHB.  TVP was placed. Pending PPM.   Assessment & Plan    Complete Heart Block  Symptomatic Bradycardia  -no reversible causes identified > TSH wnl, mild hyperkalemia  -coreg  held on admit  -plan for dual chamber PPM with LBA pacing, but if unable to achieve left-sided conduction system capture, transition to CRT-P with prior LVEF 30-35% (now recovered to 50-55%) & anticipated high pacing burden -continue TVP, VVI 70 bpm, 5mA > loss of capture at 1mA with CHB underlying -clear liquid breakfast, then NPO for planned PPM     For questions or updates,  please contact [facility] Please consult [email or web address] for contact info under     Signed, Daphne Barrack, NP-C, AGACNP-BC [facility] - Electrophysiology  12/31/2023, 7:25 AM

## 2023-12-31 NOTE — Op Note (Signed)
 Procedure:  LEFT sided MDT DC/LBaP PPM Implant   Attending: Adina Primus, MD   Indication for PPM: CHB, sx bradycardia    Complications: none apparent at time of procedure   EBL: minimal   Anesthesia: monitored anesthesia care  Procedure Description: The left shoulder was prepped with chlorhexidine  scrub. 20 cc lidocaine  was injected at the planned incision site. A pocket was created with electrocautery and blunt dissection.    A venogram was performed which demonstrated a patent axillary vein. The left axillary vein was accessed using a standard access needle under ultrasound guidance. A wire was passed into the IVC. The access process was repeated twice and a second wire and third wire were placed into the IVC.  A 9 Fr sheath was advanced and the dilator removed. Through this short sheath, a  C315 fixed curve sheath was advanced to the basal RV septum.   The site for deployment was identified based on RAO and LAO fluoroscopy and response to pacing. With RV endocardial pacing, we observed a a W pattern in V1 and aVR/aVL discordance The body of the 3830 lead was rotated in a clockwise fashion and it was advanced into the RV septum.   The paced morphology demonstrated QS in V1.  An isoelectric segment was present.  The LVAT was 82 ms as measured  from onset of stim to peak R wave in V6. The paced QRS duration was 133 ms.  The above findings were most consistent with non-selective LB capture  The sheath was split and the RV lead was firmly attached to the pre-pectoralis fascia with three 0-silk sutures.  A standard 0.035 wire was advanced to the RA. The 7.2 Fr Medtronic Attain CS Guide Catheter was then advanced over the wire while connected to a contrast injection system. The dilator was removed and the CS guide was advanced into coronary sinus using fluoroscopic guidance. The CS balloon catheter was then advanced into the lumen of the CS guide and a coronary sinus venogram was  obtained.  This demonstrated an initial target of a mid lateral vein. The Attain Select II 120D Sub-selection catheter, and 0.014 Engineer, Mining wire were used to cannulate the target branch. The mid lateral branch had an acute cranial take off with an omega curve. The wire tracked to the proximal third of the vessel however the subselection catheter (tried Attain Select II 120D and 90D as well as Worley Renal sub selection catheter). Also exchanged the Whisper wire for a [employer] guidewire. The target vein was unable to be successfully cannulated with any of the above subselection catheters. It did connect via small collaterals to a diminutive PL branch. There were no other branches with sufficient vessel caliber to accommodate lead placement. Further attempts were abandoned in favor of a LBaP lead.   A 7 Fr sheath was placed over the third wire. The RA lead was subsequently advanced over a straight stylet into the inferior RA. A curved stylet was then used to affix the RA lead in the right atrial appendage. The active fixation screw was deployed, and interrogation revealed a current of injury with acceptable lead parameters (see below). The peel away sheath was removed and the lead was affixed to the fascia using two 0-silk sutures.   The device was then connected to the leads. The pocket was irrigated with Irrisept. The pocket was closed with continuous 2-0 V-Loc and 4-0 Stratafix suture. Dermabond was applied.   The device was evaluated by the  representative of the cardiac device manufacturer under the supervision of the attending physician with implant parameters listed below.   Implanted Hardware: Implant Name Type Inv. Item Serial No. Manufacturer Lot No. LRB No. Used Action  LEAD CAPSURE NOVUS 5076-52CM - [id number] Lead LEAD CAPSURE NOVUS 5076-52CM [id number] [employer]  N/A 1 Implanted  LEAD SELECT SECURE 3830 [id number] - [id number] Lead LEAD  SELECT SECURE 3830 [id number] [employer]  N/A 1 Implanted  IPG PACE AZUR XT DR MRI T8IM98 - [id number] Pacemaker IPG PACE AZUR XT DR MRI T8IM98 [id number] [employer]  N/A 1 Implanted   Device Information: PPM: MDT Azure XT DR MRI SureScan, SN [id number], DOI 12/31/23 RA: MDT 5076/52 cm, SN [id number], DOI 12/31/23 RV/LBaP: MDT 3830/69 cm, SN [id number], DOI 12/31/23  Lead Interrogation Data: RA Sensing Amplitude: 1.0 mV  RA Pace/Sense Impedance: 494 ohms  RA Capture Threshold: 0.75 V @ 0.4 ms  RV Sensing Amplitude: CHB (VP at 30, nothing underlying) RV Pace/Sense Impedance: 608 ohms  RV Capture Threshold: 0.75 V @ 0.4 ms  Summary: Successful implant of a LEFT sided MDT DC/LBaP PPM implant   Recommendations: Routine post-procedure care with bedrest for 2 hours No heparin  (IV or subcutaneous) for 48 hours. No enoxaparin  (IV or subcutaneous) for 7 days.  PA/lateral CXR in AM   Wound check in AM Device interrogation in AM  Donnice DELENA Primus, MD [facility]  Cardiac Electrophysiology

## 2023-12-31 NOTE — Interval H&P Note (Signed)
 History and Physical Interval Note:  12/31/2023 12:27 PM  [name]  has presented today for surgery, with the diagnosis of heart block.  The various methods of treatment have been discussed with the patient and family. After consideration of risks, benefits and other options for treatment, the patient has consented to  Procedure(s): PACEMAKER IMPLANT (N/A) as a surgical intervention.  The patient's history has been reviewed, patient examined, no change in status, stable for surgery.  I have reviewed the patient's chart and labs.  Questions were answered to the patient's satisfaction.     [name]

## 2023-12-31 NOTE — TOC Initial Note (Signed)
 Transition of Care [facility]) - Initial/Assessment Note    Patient Details  Name: [name] MRN: [id number] Date of Birth: 09/17/1945  Transition of Care [facility]) CM/SW Contact:    Justina Delcia Czar, RN Phone Number: [phone] 12/31/2023, 6:00 PM  Clinical Narrative:                  Spoke to pt's dtr. Pt lives at home with son. She has RW for ambulation. Will continue to follow for dc needs.     Expected Discharge Plan: Home/Self Care Barriers to Discharge: Continued Medical Work up   Patient Goals and CMS Choice    Expected Discharge Plan and Services   Discharge Planning Services: CM Consult   Living arrangements for the past 2 months: Single Family Home                   Prior Living Arrangements/Services Living arrangements for the past 2 months: Single Family Home Lives with:: Adult Children Patient language and need for interpreter reviewed:: Yes Do you feel safe going back to the place where you live?: Yes      Need for Family Participation in Patient Care: No (Comment) Care giver support system in place?: Yes (comment)   Criminal Activity/Legal Involvement Pertinent to Current Situation/Hospitalization: No - Comment as needed  Activities of Daily Living      Permission Sought/Granted Permission sought to share information with : Case Manager, Family Supports, PCP Permission granted to share information with : Yes, Verbal Permission Granted  Share Information with NAME: Javona Bergevin  Permission granted to share info w AGENCY: Home Health, PCP, DME  Permission granted to share info w Relationship: daughter  Permission granted to share info w Contact Information: [phone]  Emotional Assessment   Admission diagnosis:  Bradycardia [R00.1] Symptomatic bradycardia [R00.1] Patient Active Problem List   Diagnosis Date Noted   Complete heart block (HCC) 12/31/2023   Symptomatic bradycardia 12/28/2023   Iron deficiency anemia 09/27/2022   Acute on  chronic systolic CHF (congestive heart failure) (HCC) 09/25/2022   PAD (peripheral artery disease) 09/25/2022   Malnutrition of moderate degree 03/01/2022   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) 07/15/2019   Cystoid macular edema of left eye 05/26/2019   Moderate nonproliferative diabetic retinopathy of both eyes (HCC) 05/26/2019   Early stage nonexudative age-related macular degeneration of both eyes 05/26/2019   Hyperglycemia    CKD stage 3b, GFR 30-44 ml/min (HCC) 03/17/2016   Hyperlipidemia associated with type 2 diabetes mellitus (HCC)    COPD (chronic obstructive pulmonary disease) (HCC) 06/28/2015   Chronic systolic CHF (congestive heart failure) (HCC) 06/28/2015   NICM (nonischemic cardiomyopathy) (HCC) 10/05/2012   Abdominal mass, LLQ (left lower quadrant) 05/03/2012   PULMONARY NODULE 08/05/2008   Palpitations 08/05/2008   Type 2 diabetes mellitus with hyperlipidemia (HCC) 07/16/2008   Essential hypertension 07/16/2008   PCP:  Regino Slater, MD Pharmacy:   [facility] [address], [place] - [address] [place] [address] Phone: [phone] Fax: [phone]     Social Drivers of Health (SDOH) Social History: SDOH Screenings   Food Insecurity: No Food Insecurity (12/29/2023)  Housing: Low Risk  (12/29/2023)  Transportation Needs: No Transportation Needs (12/29/2023)  Utilities: Not At Risk (12/29/2023)  Alcohol Screen: Low Risk  (09/25/2022)  Financial Resource Strain: Low Risk  (09/25/2022)  Social Connections: Moderately Integrated (12/29/2023)  Tobacco Use: Medium Risk (12/28/2023)  SDOH Interventions:     Readmission Risk Interventions     No data to display

## 2023-12-31 NOTE — Progress Notes (Signed)
   NAME:  [name], MRN:  [id number], DOB:  [birth year], LOS: 3 ADMISSION DATE:  12/28/2023 History of Present Illness:  This is a case of an 78 year old female patient with a past medical history of hypertension, CKD with baseline creatinine of 1.8 mg/dL, nonischemic cardiomyopathy with a EF 30%, type 2 diabetes mellitus presenting to [facility] on 11/07 for dizziness lightheadedness and nausea.  In the ED she was noted to be bradycardic.  EKG with complete heart block.  Heart rate as low as 30.  However patient mentating well.  Hemodynamically stable.  Labs without any significant electrolyte abnormalities.  AKI noted with a creatinine of 2.56 mg/dL from a baseline of 1.7 mg/dL.  Elevated BNP at 2500.  Troponin flat in the 120s/130s.  Mildly elevated AST/ALT at 229 and 186.  Lactic acid 2.0.  Cardiology evaluated and given hemodynamic stability no intervention at this time.  Started on glucagon.  She is being admitted to the ICU for close monitoring.  Pertinent  Medical History  As above  Significant Hospital Events: Including procedures, antibiotic start and stop dates in addition to other pertinent events   12/29/2023 -admitted to the ICU.  Interval history:   She complains of neck pain today.  Objective    Blood pressure (!) 152/71, pulse 70, temperature 98.2 F (36.8 C), temperature source Oral, resp. rate 16, height 5' 3.5 (1.613 m), weight 57.8 kg, SpO2 97%.        Intake/Output Summary (Last 24 hours) at 12/31/2023 1031 Last data filed at 12/31/2023 0900 Gross per 24 hour  Intake 1392.07 ml  Output 1500 ml  Net -107.93 ml   Filed Weights   12/28/23 1750 12/29/23 0417  Weight: 56.7 kg 57.8 kg    Examination: General: wdwn elderly F NAD  HENT: NCAT pink mm  Lungs: even and unlabored on RA  Cardiovascular: paced. Cap refill < 3 sec  Abdomen: soft  GU: defer  Extremities: no pitting edema no cyanosis or clubbing  Skin: c/d/w   Resolved problem list:   Lactic acidosis  Assessment and Plan    CHB Acute HFpEF  P -TVP per EP -clear liquid breakfast then NPO for add on dual chamber PPM 11/10 afternoon -abx ppx per EP  -statin, imdur, hydral  -hold home jardiance  coreg  spiro entresto  -defer lasix  11/10  -will f/u post procedure 11/10   AKI on CKD 3b  P -follow renal indices uop  -defer diuresis 11/10 AM pre procedure   Elevated LFTs -PRN labs    DM2  -SSI   Neck ache -heating pad, lido patch     CCT n/a    Mod MDM  Ronnald Gave MSN, AGACNP-BC [facility] Amion for pager  12/31/2023, 10:31 AM

## 2024-01-01 ENCOUNTER — Encounter: Payer: Self-pay | Admitting: Emergency Medicine

## 2024-01-01 ENCOUNTER — Encounter (HOSPITAL_COMMUNITY): Payer: Self-pay | Admitting: Student in an Organized Health Care Education/Training Program

## 2024-01-01 ENCOUNTER — Inpatient Hospital Stay (HOSPITAL_COMMUNITY)

## 2024-01-01 DIAGNOSIS — I5023 Acute on chronic systolic (congestive) heart failure: Secondary | ICD-10-CM | POA: Diagnosis not present

## 2024-01-01 LAB — CBC
HCT: 26.9 % — ABNORMAL LOW (ref 36.0–46.0)
Hemoglobin: 9.1 g/dL — ABNORMAL LOW (ref 12.0–15.0)
MCH: 31.3 pg (ref 26.0–34.0)
MCHC: 33.8 g/dL (ref 30.0–36.0)
MCV: 92.4 fL (ref 80.0–100.0)
Platelets: 157 K/uL (ref 150–400)
RBC: 2.91 MIL/uL — ABNORMAL LOW (ref 3.87–5.11)
RDW: 13.6 % (ref 11.5–15.5)
WBC: 5.5 K/uL (ref 4.0–10.5)
nRBC: 0 % (ref 0.0–0.2)

## 2024-01-01 LAB — GLUCOSE, CAPILLARY
Glucose-Capillary: 104 mg/dL — ABNORMAL HIGH (ref 70–99)
Glucose-Capillary: 152 mg/dL — ABNORMAL HIGH (ref 70–99)
Glucose-Capillary: 157 mg/dL — ABNORMAL HIGH (ref 70–99)
Glucose-Capillary: 180 mg/dL — ABNORMAL HIGH (ref 70–99)
Glucose-Capillary: 194 mg/dL — ABNORMAL HIGH (ref 70–99)
Glucose-Capillary: 215 mg/dL — ABNORMAL HIGH (ref 70–99)

## 2024-01-01 LAB — BASIC METABOLIC PANEL WITH GFR
Anion gap: 6 (ref 5–15)
BUN: 60 mg/dL — ABNORMAL HIGH (ref 8–23)
CO2: 24 mmol/L (ref 22–32)
Calcium: 8.8 mg/dL — ABNORMAL LOW (ref 8.9–10.3)
Chloride: 106 mmol/L (ref 98–111)
Creatinine, Ser: 1.98 mg/dL — ABNORMAL HIGH (ref 0.44–1.00)
GFR, Estimated: 25 mL/min — ABNORMAL LOW (ref >60)
Glucose, Bld: 189 mg/dL — ABNORMAL HIGH (ref 70–99)
Potassium: 4.7 mmol/L (ref 3.5–5.1)
Sodium: 136 mmol/L (ref 135–145)

## 2024-01-01 LAB — MAGNESIUM: Magnesium: 2.2 mg/dL (ref 1.7–2.4)

## 2024-01-01 MED ORDER — INSULIN ASPART 100 UNIT/ML IJ SOLN
0.0000 [IU] | Freq: Every day | INTRAMUSCULAR | Status: DC
  Administered 2024-01-02: 2 [IU] via SUBCUTANEOUS
  Filled 2024-01-01: qty 2

## 2024-01-01 MED ORDER — ATORVASTATIN CALCIUM 40 MG PO TABS
40.0000 mg | ORAL_TABLET | Freq: Every day | ORAL | Status: DC
  Administered 2024-01-01 – 2024-01-05 (×5): 40 mg via ORAL
  Filled 2024-01-01 (×5): qty 1

## 2024-01-01 MED ORDER — INSULIN ASPART 100 UNIT/ML IJ SOLN
0.0000 [IU] | Freq: Three times a day (TID) | INTRAMUSCULAR | Status: DC
  Administered 2024-01-01 – 2024-01-02 (×2): 3 [IU] via SUBCUTANEOUS
  Administered 2024-01-02: 5 [IU] via SUBCUTANEOUS
  Administered 2024-01-02 – 2024-01-03 (×2): 3 [IU] via SUBCUTANEOUS
  Administered 2024-01-03: 8 [IU] via SUBCUTANEOUS
  Administered 2024-01-03: 3 [IU] via SUBCUTANEOUS
  Administered 2024-01-04: 2 [IU] via SUBCUTANEOUS
  Administered 2024-01-04 (×2): 3 [IU] via SUBCUTANEOUS
  Administered 2024-01-05: 5 [IU] via SUBCUTANEOUS
  Filled 2024-01-01: qty 5
  Filled 2024-01-01: qty 3
  Filled 2024-01-01: qty 2
  Filled 2024-01-01 (×5): qty 3
  Filled 2024-01-01: qty 8
  Filled 2024-01-01: qty 5
  Filled 2024-01-01: qty 3

## 2024-01-01 MED ORDER — IRBESARTAN 150 MG PO TABS
150.0000 mg | ORAL_TABLET | Freq: Every day | ORAL | Status: DC
  Administered 2024-01-01: 150 mg via ORAL
  Filled 2024-01-01: qty 1

## 2024-01-01 MED FILL — Midazolam HCl Inj 2 MG/2ML (Base Equivalent): INTRAMUSCULAR | Qty: 2 | Status: AC

## 2024-01-01 MED FILL — Gentamicin Sulfate Inj 40 MG/ML: INTRAMUSCULAR | Qty: 80 | Status: AC

## 2024-01-01 NOTE — Discharge Instructions (Signed)
 After Your Pacemaker   You have a Medtronic Pacemaker  If you have a Medtronic or Biotronik device, plug in your home monitor once you get home, and no manual interaction is required.   If you have an [employer] or [employer] device, plug your home monitor once you get home, sit near the device, and press the large activation button. Sit nearby until the process is complete, usually notated by lights on the monitor.   If you were set up for monitoring using an app on your phone, make sure the app remains open in the background and the Bluetooth remains on.  ACTIVITY Do not lift your arm above shoulder height for 1 week after your procedure. After 7 days, you may progress as below.  You should remove your sling 24 hours after your procedure, unless otherwise instructed by your provider.     Tuesday January 08, 2024  Wednesday January 09, 2024 Thursday January 10, 2024 Friday January 11, 2024   Do not lift, push, pull, or carry anything over 10 pounds with the affected arm until 6 weeks (Tuesday February 12, 2024 ) after your procedure.   You may drive AFTER your wound check, unless you have been told otherwise by your provider.   Ask your healthcare provider when you can go back to work   INCISION/Dressing  Do not remove steri-strips or glue as below.  Monitor your Pacemaker site for redness, swelling, and drainage. Call the device clinic at [phone] if you experience these symptoms or fever/chills.  If your incision is sealed with Dermabond (glue), you may shower 24 hrs after your procedure or when told by your provider. Do not remove the glue or let the shower hit directly on your site. You may wash around your site with soap and water.    If you were discharged in a sling, please do not wear this during the day more than 48 hours after your surgery unless otherwise instructed. This may increase the risk of stiffness and soreness in your shoulder.   Avoid lotions,  ointments, or perfumes over your incision until it is well-healed.  You may use a hot tub or a pool AFTER your wound check appointment if the incision is completely closed.  Pacemaker Alerts:  Some alerts are vibratory and others beep. These are NOT emergencies. Please call our office to let us  know. If this occurs at night or on weekends, it can wait until the next business day. Send a remote transmission.  If your device is capable of reading fluid status (for heart failure), you will be offered monthly monitoring to review this with you.   DEVICE MANAGEMENT Remote monitoring is used to monitor your pacemaker from home. This monitoring is scheduled every 91 days by our office. It allows us  to keep an eye on the functioning of your device to ensure it is working properly. You will routinely see your Electrophysiologist annually (more often if necessary).  This will appear as a REMOTE check on your MyChart schedule. These are automatic and there is nothing for you to manually do unless otherwise instructed.  You should receive your ID card for your new device in 4-8 weeks. Keep this card with you at all times once received. Consider wearing a medical alert bracelet or necklace.  Your Pacemaker may be MRI compatible. This will be discussed at your next office visit/wound check.  You should avoid contact with strong electric or magnetic fields.   Do not use amateur (ham)  radio equipment or electric (arc) optician, dispensing. MP3 player headphones with magnets should not be used. Some devices are safe to use if held at least 12 inches (30 cm) from your Pacemaker. These include power tools, lawn mowers, and speakers. If you are unsure if something is safe to use, ask your health care provider.  When using your cell phone, hold it to the ear that is on the opposite side from the Pacemaker. Do not leave your cell phone in a pocket over the Pacemaker.  You may safely use electric blankets, heating pads,  computers, and microwave ovens.  Call the office right away if: You have chest pain. You feel more short of breath than you have felt before. You feel more light-headed than you have felt before. Your incision starts to open up.  This information is not intended to replace advice given to you by your health care provider. Make sure you discuss any questions you have with your health care provider.

## 2024-01-01 NOTE — Plan of Care (Signed)
  Problem: Education: Goal: Ability to describe self-care measures that may prevent or decrease complications (Diabetes Survival Skills Education) will improve Outcome: Progressing Goal: Individualized Educational Video(s) Outcome: Progressing   Problem: Coping: Goal: Ability to adjust to condition or change in health will improve Outcome: Progressing   Problem: Fluid Volume: Goal: Ability to maintain a balanced intake and output will improve Outcome: Progressing   Problem: Health Behavior/Discharge Planning: Goal: Ability to identify and utilize available resources and services will improve Outcome: Progressing Goal: Ability to manage health-related needs will improve Outcome: Progressing   Problem: Metabolic: Goal: Ability to maintain appropriate glucose levels will improve Outcome: Progressing   Problem: Nutritional: Goal: Maintenance of adequate nutrition will improve Outcome: Progressing Goal: Progress toward achieving an optimal weight will improve Outcome: Progressing   Problem: Skin Integrity: Goal: Risk for impaired skin integrity will decrease Outcome: Progressing   Problem: Tissue Perfusion: Goal: Adequacy of tissue perfusion will improve Outcome: Progressing   Problem: Education: Goal: Knowledge of General Education information will improve Description: Including pain rating scale, medication(s)/side effects and non-pharmacologic comfort measures Outcome: Progressing   Problem: Health Behavior/Discharge Planning: Goal: Ability to manage health-related needs will improve Outcome: Progressing   Problem: Clinical Measurements: Goal: Ability to maintain clinical measurements within normal limits will improve Outcome: Progressing Goal: Will remain free from infection Outcome: Progressing Goal: Diagnostic test results will improve Outcome: Progressing Goal: Respiratory complications will improve Outcome: Progressing Goal: Cardiovascular complication will  be avoided Outcome: Progressing   Problem: Activity: Goal: Risk for activity intolerance will decrease Outcome: Progressing   Problem: Nutrition: Goal: Adequate nutrition will be maintained Outcome: Progressing   Problem: Coping: Goal: Level of anxiety will decrease Outcome: Progressing   Problem: Elimination: Goal: Will not experience complications related to bowel motility Outcome: Progressing Goal: Will not experience complications related to urinary retention Outcome: Progressing   Problem: Pain Managment: Goal: General experience of comfort will improve and/or be controlled Outcome: Progressing   Problem: Safety: Goal: Ability to remain free from injury will improve Outcome: Progressing   Problem: Skin Integrity: Goal: Risk for impaired skin integrity will decrease Outcome: Progressing   Problem: Education: Goal: Knowledge of cardiac device and self-care will improve Outcome: Progressing Goal: Ability to safely manage health related needs after discharge will improve Outcome: Progressing Goal: Individualized Educational Video(s) Outcome: Progressing   Problem: Cardiac: Goal: Ability to achieve and maintain adequate cardiopulmonary perfusion will improve Outcome: Progressing

## 2024-01-01 NOTE — Progress Notes (Signed)
  Patient Name: [name] Date of Encounter: 01/01/2024  Primary Cardiologist: Alm Clay, MD Electrophysiologist: Dr. Almetta   Interval Summary   The patient is doing well today.  Reports her hips hurt.  At this time, the patient denies chest pain, shortness of breath, or any new concerns.  Vital Signs    Vitals:   01/01/24 0630 01/01/24 0700 01/01/24 0748 01/01/24 0800  BP:  (!) 152/71  (!) 148/68  Pulse: 74 84  69  Resp: 17 15  14   Temp:   (!) 97.5 F (36.4 C)   TempSrc:   Axillary   SpO2: 98% 98%  97%  Weight:      Height:        Intake/Output Summary (Last 24 hours) at 01/01/2024 1028 Last data filed at 01/01/2024 0600 Gross per 24 hour  Intake 144.15 ml  Output 700 ml  Net -555.85 ml   Filed Weights   12/28/23 1750 12/29/23 0417  Weight: 56.7 kg 57.8 kg    Physical Exam    GEN- NAD, Alert and oriented  Lungs- Clear to ausculation bilaterally, normal work of breathing Cardiac- Regular rate and rhythm, no murmurs, rubs or gallops GI- soft, NT, ND, + BS Extremities- no clubbing or cyanosis. No edema  Telemetry    VP 70's (personally reviewed)  Hospital Course    LEZLEE GILLS is a 78 y.o. female  with PMH of NICM, HTN, DM II admitted 12/28/23 after a mechanical fall, lightheadedness / dizziness and nausea. She was found to be in CHB.  TVP was placed. She underwent placement of dual chamber MDT PPM on 12/31/23 without complication.    Assessment & Plan    Complete Heart Block  Symptomatic Bradycardia  -post PPM care discussed with patient, written instructions provided  -CXR reviewed post implant, leads in good position / no PTX  -post implant device check within normal limits  -EP follow up scheduled with 14 day wound check and 90 day device check  -avoid heparin  products for 48 hours post implant   HFrEF  NICM  -per Advanced HF Team   R Inferior Pubic Ramus Fx  -per primary team    For questions or updates, please contact [facility] Please consult [email or web address] for contact info under     Signed, Daphne Barrack, NP-C, AGACNP-BC [facility] - Electrophysiology  01/01/2024, 10:33 AM

## 2024-01-01 NOTE — Progress Notes (Signed)
 Patient ID: [name], female   DOB: 12/04/1945, 78 y.o.   MRN: [id number]     Advanced Heart Failure Rounding Note  Cardiologist: Alm Clay, MD  Chief Complaint: bradycardia Subjective:    Feels ok this morning. Complaining of hip discomfort. Denies CP/SOB. S/p PPM 12/31/23.   Objective:   Weight Range: 57.8 kg Body mass index is 22.22 kg/m.   Vital Signs:   Temp:  [97.5 F (36.4 C)-98.5 F (36.9 C)] 97.5 F (36.4 C) (11/11 0748) Pulse Rate:  [40-86] 74 (11/11 0630) Resp:  [5-24] 17 (11/11 0630) BP: (96-166)/(44-120) 149/79 (11/11 0600) SpO2:  [94 %-100 %] 98 % (11/11 0630) Last BM Date : 12/31/23  Weight change: Filed Weights   12/28/23 1750 12/29/23 0417  Weight: 56.7 kg 57.8 kg    Intake/Output:   Intake/Output Summary (Last 24 hours) at 01/01/2024 0757 Last data filed at 01/01/2024 0600 Gross per 24 hour  Intake 652.75 ml  Output 700 ml  Net -47.25 ml     Physical Exam  General:  elderly / frail appearing.  No respiratory difficulty Neck: JVD flat.  Cor: Regular rate & rhythm. No murmurs. Lungs: clear Extremities: No edema.  Neuro: alert & oriented x 3. Affect pleasant.   Telemetry   Paced 60s (Personally reviewed)    Labs    CBC Recent Labs    12/31/23 0913 01/01/24 0215  WBC 6.0 5.5  HGB 9.4* 9.1*  HCT 28.0* 26.9*  MCV 93.0 92.4  PLT 160 157   Basic Metabolic Panel Recent Labs    88/89/74 0913 01/01/24 0215  NA 136 136  K 5.0 4.7  CL 105 106  CO2 18* 24  GLUCOSE 145* 189*  BUN 72* 60*  CREATININE 2.06* 1.98*  CALCIUM  9.0 8.8*  MG 1.9 2.2   Liver Function Tests No results for input(s): AST, ALT, ALKPHOS, BILITOT, PROT, ALBUMIN in the last 72 hours.     Medications:     Scheduled Medications:  Chlorhexidine  Gluconate Cloth  6 each Topical Daily   heparin  injection (subcutaneous)  5,000 Units Subcutaneous Q8H   insulin  aspart  0-15 Units Subcutaneous Q4H   polyethylene glycol  17 g Oral Daily    senna  1 tablet Oral Daily   sodium chloride  flush  10-40 mL Intracatheter Q12H    Infusions:    PRN Medications: acetaminophen , bisacodyl , fentaNYL  (SUBLIMAZE ) injection, lidocaine , ondansetron  **OR** ondansetron  (ZOFRAN ) IV, mouth rinse, oxyCODONE , sodium chloride  flush   Assessment/Plan   Complete heart block: With syncope prior to admission.  Suspect due to conduction system degeneration.  She was on Coreg  3.125 mg bid at home, minimal beta blockade.  Troponin mildly elevated with no trend, doubt ACS.  She had prior cath with nonobstructive CAD.  TVP placed 11/8 - S/p CRT-P 11/10 - EP following   Acute on chronic systolic CHF: H/o nonischemic CMP, last echo in 8/24 with EF 30-35%, moderate RV dysfunction.  Now with volume overload and AKI in setting of CHB.   - Holding entresto , coreg , MRA, and SGLT2 currently. Will not be able to restart Entresto  as patient assistance was stopped.  - Start irbesartan 150 mg daily - Spiro when renal function improves - SGLT2i when more mobile  - Does not appear to need additional diuretics today.   AKI on CKD stage 3: AKI in setting of CHB and syncope/hypotension. SCR up to 2.6. BMET pending today.   R inferior pubic ramus fx - Post syncopal event on admission -  Per primary team - PT ordered today   Beckey LITTIE Coe AGACNP-BC  Advanced Heart Failure Team  01/01/24

## 2024-01-01 NOTE — Progress Notes (Signed)
   NAME:  [name], MRN:  [id number], DOB:  04/29/1945, LOS: 4 ADMISSION DATE:  12/28/2023 History of Present Illness:  This is a case of an 78 year old female patient with a past medical history of hypertension, CKD with baseline creatinine of 1.8 mg/dL, nonischemic cardiomyopathy with a EF 30%, type 2 diabetes mellitus presenting to [facility] on 11/07 for dizziness lightheadedness and nausea.  In the ED she was noted to be bradycardic.  EKG with complete heart block.  Heart rate as low as 30.  However patient mentating well.  Hemodynamically stable.  Labs without any significant electrolyte abnormalities.  AKI noted with a creatinine of 2.56 mg/dL from a baseline of 1.7 mg/dL.  Elevated BNP at 2500.  Troponin flat in the 120s/130s.  Mildly elevated AST/ALT at 229 and 186.  Lactic acid 2.0.  Cardiology evaluated and given hemodynamic stability no intervention at this time.  Started on glucagon.  She is being admitted to the ICU for close monitoring.  Pertinent  Medical History  As above  Significant Hospital Events: Including procedures, antibiotic start and stop dates in addition to other pertinent events   12/29/2023 -admitted to the ICU. 11/10 DC LBaP PPM 11/11 txf out of ICU   Interval history:   POD1 dual chamber PPM  C/o hip pain today    cr 1.98  Hgb stable   CXR w atelectasis   Objective    Blood pressure (!) 148/68, pulse 69, temperature (!) 97.5 F (36.4 C), temperature source Axillary, resp. rate 14, height 5' 3.5 (1.613 m), weight 57.8 kg, SpO2 97%.        Intake/Output Summary (Last 24 hours) at 01/01/2024 0915 Last data filed at 01/01/2024 0600 Gross per 24 hour  Intake 152.77 ml  Output 700 ml  Net -547.23 ml   Filed Weights   12/28/23 1750 12/29/23 0417  Weight: 56.7 kg 57.8 kg    Examination: General: wdwn elderly F NAD  HENT: NCAT pink mm  Lungs: even and unlabored on RA  Cardiovascular: paced. Cap refill < 3 sec  Abdomen: soft   GU: defer  Extremities: no pitting edema no cyanosis or clubbing  Skin: c/d/w   Resolved problem list:  Lactic acidosis  Assessment and Plan    CHD s/p DC LBaP PPM (11/10) Acute on chronic  HFrEF  P -device per EP  -HF per EP -- appreciate recs re med changes -stable for txf out of ICU -abx ppx per EP  -I have dc her heparin  ppx per EP rec  -ibesartan  -statin  -holding home jiardiance coreg  spiro    AKI on CKD 3b, improving AKI P -follow renal indices UOP -renal fxn limits  Elevated LFTs -PRN labs    DM2  -SSI   Neck ache, improving  -heating pad, lido patch   R inferior pubic ramus fx -d/w ortho-- this is non surgical -PT, sx management  Dispo: Stable to txf out of ICU 11/11. Will ask TR[facility] to take over 11/12      CCT n/a   Moderate MDM   Ronnald Gave MSN, AGACNP-BC [facility] Pulmonary/Critical Care Medicine Amion for pager  01/01/2024, 9:15 AM

## 2024-01-01 NOTE — TOC Progression Note (Signed)
 Transition of Care [facility]) - Progression Note    Patient Details  Name: [name] MRN: [id number] Date of Birth: [birth year]  Transition of Care [facility]) CM/SW Contact  Justina Delcia Czar, RN Phone Number: [phone] 01/01/2024, 3:46 PM  Clinical Narrative:     Spoke to pt and states she has is not at her baseline. She reports feeling weak and sleepy. States she had [facility] at home in the past for [facility]. Medicare.gov list with rating placed in chart. Agreeable to HH at dc. Contacted [facility] rep, with new referral. Will need HH orders with F2F at dc.  Waiting PT/OT evaluation and recommendations.   Dtr will provide transportation home.   Will arrange PCP hospital follow up at dc.   Expected Discharge Plan: Home w Home Health Services Barriers to Discharge: Continued Medical Work up    Expected Discharge Plan and Services   Discharge Planning Services: CM Consult Post Acute Care Choice: Home Health Living arrangements for the past 2 months: Single Family Home                           HH Arranged: PT, RN [facility] Agency: [facility] Date [facility] Agency Contacted: 01/01/24 Time HH Agency Contacted: [phone] Representative spoke with at [facility] Agency: Darleene Gowda   Social Drivers of Health (SDOH) Interventions SDOH Screenings   Food Insecurity: No Food Insecurity (12/29/2023)  Housing: Low Risk  (12/29/2023)  Transportation Needs: No Transportation Needs (12/29/2023)  Utilities: Not At Risk (12/29/2023)  Alcohol Screen: Low Risk  (09/25/2022)  Financial Resource Strain: Low Risk  (09/25/2022)  Social Connections: Moderately Integrated (12/29/2023)  Tobacco Use: Medium Risk (12/28/2023)    Readmission Risk Interventions     No data to display

## 2024-01-02 DIAGNOSIS — I5023 Acute on chronic systolic (congestive) heart failure: Secondary | ICD-10-CM | POA: Diagnosis not present

## 2024-01-02 DIAGNOSIS — R001 Bradycardia, unspecified: Secondary | ICD-10-CM | POA: Diagnosis not present

## 2024-01-02 LAB — HEPATIC FUNCTION PANEL
ALT: 31 U/L (ref 0–44)
AST: 13 U/L — ABNORMAL LOW (ref 15–41)
Albumin: 2.9 g/dL — ABNORMAL LOW (ref 3.5–5.0)
Alkaline Phosphatase: 74 U/L (ref 38–126)
Bilirubin, Direct: <0.1 mg/dL (ref 0.0–0.2)
Total Bilirubin: 1 mg/dL (ref 0.0–1.2)
Total Protein: 5.7 g/dL — ABNORMAL LOW (ref 6.5–8.1)

## 2024-01-02 LAB — BASIC METABOLIC PANEL WITH GFR
Anion gap: 12 (ref 5–15)
BUN: 44 mg/dL — ABNORMAL HIGH (ref 8–23)
CO2: 19 mmol/L — ABNORMAL LOW (ref 22–32)
Calcium: 9 mg/dL (ref 8.9–10.3)
Chloride: 107 mmol/L (ref 98–111)
Creatinine, Ser: 1.7 mg/dL — ABNORMAL HIGH (ref 0.44–1.00)
GFR, Estimated: 30 mL/min — ABNORMAL LOW (ref >60)
Glucose, Bld: 184 mg/dL — ABNORMAL HIGH (ref 70–99)
Potassium: 5.2 mmol/L — ABNORMAL HIGH (ref 3.5–5.1)
Sodium: 138 mmol/L (ref 135–145)

## 2024-01-02 LAB — CBC
HCT: 27.9 % — ABNORMAL LOW (ref 36.0–46.0)
Hemoglobin: 9.4 g/dL — ABNORMAL LOW (ref 12.0–15.0)
MCH: 31.4 pg (ref 26.0–34.0)
MCHC: 33.7 g/dL (ref 30.0–36.0)
MCV: 93.3 fL (ref 80.0–100.0)
Platelets: 162 K/uL (ref 150–400)
RBC: 2.99 MIL/uL — ABNORMAL LOW (ref 3.87–5.11)
RDW: 13.7 % (ref 11.5–15.5)
WBC: 5.1 K/uL (ref 4.0–10.5)
nRBC: 0 % (ref 0.0–0.2)

## 2024-01-02 LAB — GLUCOSE, CAPILLARY
Glucose-Capillary: 180 mg/dL — ABNORMAL HIGH (ref 70–99)
Glucose-Capillary: 167 mg/dL — ABNORMAL HIGH (ref 70–99)
Glucose-Capillary: 215 mg/dL — ABNORMAL HIGH (ref 70–99)
Glucose-Capillary: 202 mg/dL — ABNORMAL HIGH (ref 70–99)

## 2024-01-02 LAB — MAGNESIUM: Magnesium: 2.2 mg/dL (ref 1.7–2.4)

## 2024-01-02 MED ORDER — OLOPATADINE HCL 0.1 % OP SOLN
1.0000 [drp] | Freq: Two times a day (BID) | OPHTHALMIC | Status: DC | PRN
  Administered 2024-01-02 – 2024-01-04 (×3): 1 [drp] via OPHTHALMIC
  Filled 2024-01-02: qty 5

## 2024-01-02 MED ORDER — IRBESARTAN 150 MG PO TABS: 150.0000 mg | ORAL_TABLET | Freq: Every day | ORAL | Status: DC

## 2024-01-02 MED ORDER — FUROSEMIDE 10 MG/ML IJ SOLN
80.0000 mg | Freq: Once | INTRAMUSCULAR | Status: AC
  Administered 2024-01-02: 80 mg via INTRAVENOUS
  Filled 2024-01-02: qty 8

## 2024-01-02 MED ORDER — INSULIN GLARGINE-YFGN 100 UNIT/ML ~~LOC~~ SOLN
10.0000 [IU] | Freq: Every day | SUBCUTANEOUS | Status: DC
  Administered 2024-01-02: 10 [IU] via SUBCUTANEOUS
  Filled 2024-01-02 (×2): qty 0.1

## 2024-01-02 MED ORDER — SODIUM ZIRCONIUM CYCLOSILICATE 5 G PO PACK
5.0000 g | PACK | Freq: Every day | ORAL | Status: AC
  Administered 2024-01-02: 5 g via ORAL
  Filled 2024-01-02: qty 1

## 2024-01-02 MED ORDER — MELATONIN 5 MG PO TABS
5.0000 mg | ORAL_TABLET | Freq: Every day | ORAL | Status: DC
  Administered 2024-01-02 – 2024-01-04 (×3): 5 mg via ORAL
  Filled 2024-01-02 (×3): qty 1

## 2024-01-02 MED ORDER — IRBESARTAN 150 MG PO TABS
300.0000 mg | ORAL_TABLET | Freq: Every day | ORAL | Status: DC
  Administered 2024-01-02 – 2024-01-05 (×4): 300 mg via ORAL
  Filled 2024-01-02 (×4): qty 2

## 2024-01-02 NOTE — Progress Notes (Signed)
 PROGRESS NOTE    [name]  FMW:[id number] DOB: 1946-02-06 DOA: 12/28/2023 PCP: Regino Slater, MD  78/F with chronic systolic CHF, CKD, hypertension, type 2 diabetes mellitus was admitted with syncope and complete heart block to [facility] ICU.  - EP consulted underwent PPM implantation 11/10 Dr. Almetta   Subjective: -Weak, tired according to daughter, some dyspnea earlier  Assessment and Plan:  Complete heart block Syncope - On low-dose Coreg  3.125 at baseline, ruled out for ACS - Prior cath noted nonobstructive CAD Transvenousor placed 11/8 -sp PPM implantation 11/10 Dr. Almetta - Increase activity, PT OT eval today  Acute on chronic systolic CHF - History of known NICM, last echo 8/24 with EF 30-35%, moderately reduced RV - Volume status appears even - Advanced heart failure team following, Entresto  Coreg  and SGLT2 on hold - Started on irbesartan yesterday, K is higher will hold dose this morning - Resume GDMT based on potassium and labs in a.m.  AKI on CKD 3 AA - In the setting of low output, complete heart block, hypotension Now improving  Right inferior pubic ramus fracture - Following syncope and fall - PT OT eval today, discussed with daughter, they would prefer home therapy services  Type 2 diabetes mellitus - Uncontrolled, A1c is 8.14 days ago - Restart Lantus ,?  Jardiance  tomorrow   DVT prophylaxis: SCDs Code Status: Full code Family Communication: Daughter at bedside Disposition Plan: Home likely tomorrow   Antimicrobials:    Objective: Vitals:   01/02/24 0341 01/02/24 0758 01/02/24 0800 01/02/24 1150  BP: (!) 157/70 (!) 164/82 (!) 164/82 129/75  Pulse: 79 84 83   Resp: 15 16  17   Temp: 98.5 F (36.9 C) 98.8 F (37.1 C)  98.7 F (37.1 C)  TempSrc: Oral Oral  Oral  SpO2: 96% 95% 100%   Weight:      Height:        Intake/Output Summary (Last 24 hours) at 01/02/2024 1154 Last data filed at 01/02/2024 0341 Gross per 24 hour  Intake 120 ml   Output 450 ml  Net -330 ml   Filed Weights   12/28/23 1750 12/29/23 0417  Weight: 56.7 kg 57.8 kg    Examination:  General exam: Appears calm and comfortable, AO x 2, flat affect Respiratory system: Clear to auscultation Cardiovascular system: S1 & S2 heard, RRR.  Abd: nondistended, soft and nontender.Normal bowel sounds heard. Central nervous system: Alert and oriented. No focal neurological deficits. Extremities: no edema Skin: No rashes Psychiatry:  Mood & affect appropriate.     Data Reviewed:   CBC: Recent Labs  Lab 12/28/23 1758 12/28/23 1813 12/29/23 0703 12/30/23 0212 12/31/23 0913 01/01/24 0215 01/02/24 0428  WBC 4.8  --  7.5 5.8 6.0 5.5 5.1  NEUTROABS 3.1  --   --   --   --   --   --   HGB 10.3*   < > 10.0* 8.8* 9.4* 9.1* 9.4*  HCT 31.2*   < > 30.2* 26.6* 28.0* 26.9* 27.9*  MCV 94.8  --  94.7 94.3 93.0 92.4 93.3  PLT 177  --  161 149* 160 157 162   < > = values in this interval not displayed.   Basic Metabolic Panel: Recent Labs  Lab 12/28/23 1758 12/28/23 1813 12/29/23 2057 12/30/23 0212 12/31/23 0913 01/01/24 0215 01/02/24 0428  NA 136   < > 135 136 136 136 138  K 5.0   < > 4.7 5.0 5.0 4.7 5.2*  CL 106   < >  104 103 105 106 107  CO2 17*   < > 19* 21* 18* 24 19*  GLUCOSE 177*   < > 157* 125* 145* 189* 184*  BUN 81*   < > 87* 84* 72* 60* 44*  CREATININE 2.56*   < > 2.91* 2.60* 2.06* 1.98* 1.70*  CALCIUM  9.1   < > 9.1 9.1 9.0 8.8* 9.0  MG 2.1  --   --   --  1.9 2.2 2.2   < > = values in this interval not displayed.   GFR: Estimated Creatinine Clearance: 22.3 mL/min (A) (by C-G formula based on SCr of 1.7 mg/dL (H)). Liver Function Tests: Recent Labs  Lab 12/28/23 1758 12/29/23 0703 01/02/24 0428  AST 186* 93* 13*  ALT 229* 175* 31  ALKPHOS 116 95 74  BILITOT 0.8 1.0 1.0  PROT 6.7 6.3* 5.7*  ALBUMIN 3.7 3.5 2.9*   No results for input(s): LIPASE, AMYLASE in the last 168 hours. No results for input(s): AMMONIA in the last  168 hours. Coagulation Profile: No results for input(s): INR, PROTIME in the last 168 hours. Cardiac Enzymes: No results for input(s): CKTOTAL, CKMB, CKMBINDEX, TROPONINI in the last 168 hours. BNP (last 3 results) No results for input(s): PROBNP in the last 8760 hours. HbA1C: No results for input(s): HGBA1C in the last 72 hours. CBG: Recent Labs  Lab 01/01/24 1116 01/01/24 1651 01/01/24 2122 01/02/24 0756 01/02/24 1148  GLUCAP 104* 152* 180* 180* 167*   Lipid Profile: No results for input(s): CHOL, HDL, LDLCALC, TRIG, CHOLHDL, LDLDIRECT in the last 72 hours. Thyroid Function Tests: No results for input(s): TSH, T4TOTAL, FREET4, T3FREE, THYROIDAB in the last 72 hours. Anemia Panel: No results for input(s): VITAMINB12, FOLATE, FERRITIN, TIBC, IRON, RETICCTPCT in the last 72 hours. Urine analysis:    Component Value Date/Time   COLORURINE YELLOW 09/25/2022 0316   APPEARANCEUR HAZY (A) 09/25/2022 0316   LABSPEC 1.010 09/25/2022 0316   PHURINE 5.0 09/25/2022 0316   GLUCOSEU >=500 (A) 09/25/2022 0316   HGBUR MODERATE (A) 09/25/2022 0316   BILIRUBINUR NEGATIVE 09/25/2022 0316   KETONESUR NEGATIVE 09/25/2022 0316   PROTEINUR 30 (A) 09/25/2022 0316   UROBILINOGEN 0.2 04/28/2012 1757   NITRITE NEGATIVE 09/25/2022 0316   LEUKOCYTESUR TRACE (A) 09/25/2022 0316   Sepsis Labs: @LABRCNTIP (procalcitonin:4,lacticidven:4)  ) Recent Results (from the past 240 hours)  MRSA Next Gen by PCR, Nasal     Status: None   Collection Time: 12/29/23  4:44 AM   Specimen: Nasal Mucosa; Nasal Swab  Result Value Ref Range Status   MRSA by PCR Next Gen NOT DETECTED NOT DETECTED Final    Comment: (NOTE) The GeneXpert MRSA Assay (FDA approved for NASAL specimens only), is one component of a comprehensive MRSA colonization surveillance program. It is not intended to diagnose MRSA infection nor to guide or monitor treatment for MRSA infections. Test  performance is not FDA approved in patients less than 53 years old. Performed at [facility], [address] [place], [place] [address]   Surgical PCR screen     Status: None   Collection Time: 12/30/23  9:28 PM   Specimen: Nasal Mucosa; Nasal Swab  Result Value Ref Range Status   MRSA, PCR NEGATIVE NEGATIVE Final   Staphylococcus aureus NEGATIVE NEGATIVE Final    Comment: (NOTE) The Xpert SA Assay (FDA approved for NASAL specimens in patients 43 years of age and older), is one component of a comprehensive surveillance program. It is not intended to  diagnose infection nor to guide or monitor treatment. Performed at [facility], [address] [place], [place] [address]      Radiology Studies: DG Chest 2 View Result Date: 01/01/2024 CLINICAL DATA:  Pacemaker placement. EXAM: CHEST - 2 VIEW COMPARISON:  12/29/2023 FINDINGS: The cardio pericardial silhouette is enlarged. Low volume film with vascular congestion and trace pleural effusions. Underlying component of interstitial edema not entirely excluded. Streaky density at the right base suggests atelectasis. Dual lead left-sided permanent pacemaker is new in the interval without evidence for pneumothorax. Telemetry leads overlie the chest. IMPRESSION: 1. Interval placement of left-sided permanent pacemaker without evidence for pneumothorax. 2. Low volume film with vascular congestion and trace pleural effusions. Electronically Signed   By: Camellia Candle M.D.   On: 01/01/2024 07:32   EP PPM/ICD IMPLANT Result Date: 12/31/2023 Summary: 1. Successful implant of a LEFT sided MDT DC/LBaP PPM implant Recommendations: 1. Routine post-procedure care with bedrest for 2 hours 2. No heparin  (IV or subcutaneous) for 48 hours. No enoxaparin  (IV or subcutaneous) for 7 days. 3. PA/lateral CXR in AM  4. Wound check in AM 5. Device interrogation in AM Donnice DELENA Primus, MD [facility] Cardiac Electrophysiology        Scheduled Meds:  atorvastatin   40 mg Oral Daily   Chlorhexidine  Gluconate Cloth  6 each Topical Daily   insulin  aspart  0-15 Units Subcutaneous TID WC   insulin  aspart  0-5 Units Subcutaneous QHS   insulin  glargine-yfgn  10 Units Subcutaneous Daily   irbesartan  300 mg Oral Daily   polyethylene glycol  17 g Oral Daily   senna  1 tablet Oral Daily   sodium chloride  flush  10-40 mL Intracatheter Q12H   Continuous Infusions:   LOS: 5 days    Time spent:    Sigurd Pac, MD [facility]   01/02/2024, 11:54 AM

## 2024-01-02 NOTE — Progress Notes (Signed)
  Inpatient Rehab Admissions Coordinator :  Per therapy recommendations, patient was screened for CIR candidacy by Ottie Glazier RN MSN.  At this time patient appears to be a potential candidate for CIR. I will place a rehab consult per protocol for full assessment. Please call me with any questions.  Ottie Glazier RN MSN Admissions Coordinator [phone]

## 2024-01-02 NOTE — Evaluation (Addendum)
 Physical Therapy Evaluation Patient Details Name: [name] MRN: [id number] DOB: 04-Oct-1945 Today's Date: 01/02/2024  History of Present Illness  78 yo F adm 12/28/23 with dizziness, syncope, bradycardia due to CHB with Rt inferior pubic rami fx. 11/8 temporary pacemaker. 11/10 PPM implant. PMHx: breast CA, CHF, CAD, HTN, HLD, COPD, PAD, macular degeneration, T2DM, NICM, CKD.   Clinical Impression  Pt in bed upon arrival of PT, agreeable to evaluation at this time. Prior to admission the pt was independent without need for DME or assist, reports her adult son lives with her but works during the day and the pt is independent with al ADLs and IADLs while home alone. The pt now presents with deficits in strength, ROM, activity tolerance, and stability due to pain and post-surgical limitations. The pt required modA to complete bed mobility, and modA to complete sit-stand with pt unable to tolerate standing balance without assist or manage lateral steps due to pain in R hip. Will attempt hemiwalker vs quad cane in next session for improved standing balance and off-weighting of RLE. Pt is highly motivated to progress mobility to baseline independence, and has good family support, recommend intensive post-acute rehab to facilitate return to greatest independence.      If plan is discharge home, recommend the following: A lot of help with walking and/or transfers;A lot of help with bathing/dressing/bathroom;Assistance with cooking/housework;Help with stairs or ramp for entrance;Assist for transportation   Can travel by private vehicle        Equipment Recommendations Wheelchair (measurements PT);Wheelchair cushion (measurements PT);BSC/3in1 psychologist, educational)  Recommendations for Other Services  Rehab consult    Functional Status Assessment Patient has had a recent decline in their functional status and demonstrates the ability to make significant improvements in function in a reasonable and predictable  amount of time.     Precautions / Restrictions Precautions Precautions: ICD/Pacemaker Recall of Precautions/Restrictions: Intact Required Braces or Orthoses: Sling Restrictions Weight Bearing Restrictions Per Provider Order: Yes LUE Weight Bearing Per Provider Order: Non weight bearing RLE Weight Bearing Per Provider Order: Weight bearing as tolerated Other Position/Activity Restrictions: LUE limited wt bearing and ROM post PPM      Mobility  Bed Mobility Overal bed mobility: Needs Assistance Bed Mobility: Supine to Sit, Sit to Supine     Supine to sit: Mod assist, HOB elevated, Used rails Sit to supine: Max assist   General bed mobility comments: modA to manage LE movement to EOB, modA for trunk elevation. maxA to return to supine to manage pain levels    Transfers Overall transfer level: Needs assistance Equipment used: 1 person hand held assist Transfers: Sit to/from Stand Sit to Stand: Mod assist           General transfer comment: modA with assist to RUE and gait belt, limited wt acceptance on RLE and unable to wt shift for stepping       Balance Overall balance assessment: Needs assistance Sitting-balance support: Feet supported, Single extremity supported Sitting balance-Leahy Scale: Poor   Postural control: Posterior lean Standing balance support: Single extremity supported, During functional activity Standing balance-Leahy Scale: Poor                               Pertinent Vitals/Pain Pain Assessment Pain Assessment: 0-10 Pain Score: 6  Pain Location: R hip Pain Descriptors / Indicators: Discomfort, Grimacing, Guarding Pain Intervention(s): Monitored during session, Limited activity within patient's tolerance, Repositioned  Home Living Family/patient expects to be discharged to:: Private residence Living Arrangements: Children (son) Available Help at Discharge: Family;Available PRN/intermittently Type of Home: House Home Access:  Level entry       Home Layout: Able to live on main level with bedroom/bathroom Home Equipment: Rollator (4 wheels);Shower seat - built in;Grab bars - tub/shower;Hand held shower head      Prior Function Prior Level of Function : Independent/Modified Independent             Mobility Comments: no use of DME, active in the home, no exercise ADLs Comments: independent with ADLs and IADLs     Extremity/Trunk Assessment   Upper Extremity Assessment Upper Extremity Assessment: Right hand dominant;Generalized weakness    Lower Extremity Assessment Lower Extremity Assessment: Generalized weakness;RLE deficits/detail RLE Deficits / Details: RLE limited by pain at hip, grossly 4-/5 to MMT RLE: Unable to fully assess due to pain RLE Sensation: history of peripheral neuropathy (bottom of feet) RLE Coordination: WNL    Cervical / Trunk Assessment Cervical / Trunk Assessment: Normal  Communication   Communication Communication: No apparent difficulties Factors Affecting Communication: Hearing impaired    Cognition Arousal: Alert Behavior During Therapy: WFL for tasks assessed/performed   PT - Cognitive impairments: No family/caregiver present to determine baseline                       PT - Cognition Comments: initially flat, but able to follow all commands and answer questions appropriately Following commands: Intact       Cueing Cueing Techniques: Verbal cues     General Comments General comments (skin integrity, edema, etc.): VSS on RA    Exercises     Assessment/Plan    PT Assessment Patient needs continued PT services  PT Problem List Decreased strength;Decreased range of motion;Decreased activity tolerance;Decreased balance;Decreased mobility;Pain       PT Treatment Interventions DME instruction;Gait training;Stair training;Functional mobility training;Therapeutic activities;Therapeutic exercise;Balance training;Patient/family education    PT  Goals (Current goals can be found in the Care Plan section)  Acute Rehab PT Goals Patient Stated Goal: to improve mobility and return home PT Goal Formulation: With patient Time For Goal Achievement: 01/16/24 Potential to Achieve Goals: Good    Frequency Min 2X/week        AM-PAC PT 6 Clicks Mobility  Outcome Measure Help needed turning from your back to your side while in a flat bed without using bedrails?: A Lot Help needed moving from lying on your back to sitting on the side of a flat bed without using bedrails?: A Lot Help needed moving to and from a bed to a chair (including a wheelchair)?: Total Help needed standing up from a chair using your arms (e.g., wheelchair or bedside chair)?: Total Help needed to walk in hospital room?: Total Help needed climbing 3-5 steps with a railing? : Total 6 Click Score: 8    End of Session Equipment Utilized During Treatment: Gait belt Activity Tolerance: Patient tolerated treatment well;Patient limited by pain Patient left: in bed;with bed alarm set;with call bell/phone within reach Nurse Communication: Mobility status PT Visit Diagnosis: Other abnormalities of gait and mobility (R26.89);Muscle weakness (generalized) (M62.81);Pain;Unsteadiness on feet (R26.81) Pain - Right/Left: Right Pain - part of body: Hip    Time: 8548-8476 PT Time Calculation (min) (ACUTE ONLY): 32 min   Charges:   PT Evaluation $PT Eval Moderate Complexity: 1 Mod PT Treatments $Therapeutic Exercise: 8-22 mins PT General Charges $$ ACUTE PT VISIT: 1  Visit         Izetta Call, PT, DPT   Acute Rehabilitation Department Office [phone] Secure Chat Communication Preferred  Izetta JULIANNA Call 01/02/2024, 3:49 PM

## 2024-01-02 NOTE — Plan of Care (Signed)
  Problem: Education: Goal: Ability to describe self-care measures that may prevent or decrease complications (Diabetes Survival Skills Education) will improve Outcome: Progressing Goal: Individualized Educational Video(s) Outcome: Progressing   Problem: Coping: Goal: Ability to adjust to condition or change in health will improve Outcome: Progressing   Problem: Fluid Volume: Goal: Ability to maintain a balanced intake and output will improve Outcome: Progressing   Problem: Health Behavior/Discharge Planning: Goal: Ability to identify and utilize available resources and services will improve Outcome: Progressing Goal: Ability to manage health-related needs will improve Outcome: Progressing   Problem: Metabolic: Goal: Ability to maintain appropriate glucose levels will improve Outcome: Progressing   Problem: Nutritional: Goal: Maintenance of adequate nutrition will improve Outcome: Progressing Goal: Progress toward achieving an optimal weight will improve Outcome: Progressing   Problem: Skin Integrity: Goal: Risk for impaired skin integrity will decrease Outcome: Progressing   Problem: Tissue Perfusion: Goal: Adequacy of tissue perfusion will improve Outcome: Progressing   Problem: Education: Goal: Knowledge of General Education information will improve Description: Including pain rating scale, medication(s)/side effects and non-pharmacologic comfort measures Outcome: Progressing   Problem: Health Behavior/Discharge Planning: Goal: Ability to manage health-related needs will improve Outcome: Progressing   Problem: Clinical Measurements: Goal: Ability to maintain clinical measurements within normal limits will improve Outcome: Progressing Goal: Will remain free from infection Outcome: Progressing Goal: Diagnostic test results will improve Outcome: Progressing Goal: Respiratory complications will improve Outcome: Progressing Goal: Cardiovascular complication will  be avoided Outcome: Progressing   Problem: Activity: Goal: Risk for activity intolerance will decrease Outcome: Progressing   Problem: Nutrition: Goal: Adequate nutrition will be maintained Outcome: Progressing   Problem: Coping: Goal: Level of anxiety will decrease Outcome: Progressing   Problem: Elimination: Goal: Will not experience complications related to bowel motility Outcome: Progressing Goal: Will not experience complications related to urinary retention Outcome: Progressing   Problem: Pain Managment: Goal: General experience of comfort will improve and/or be controlled Outcome: Progressing   Problem: Safety: Goal: Ability to remain free from injury will improve Outcome: Progressing   Problem: Skin Integrity: Goal: Risk for impaired skin integrity will decrease Outcome: Progressing   Problem: Education: Goal: Knowledge of cardiac device and self-care will improve Outcome: Progressing Goal: Ability to safely manage health related needs after discharge will improve Outcome: Progressing Goal: Individualized Educational Video(s) Outcome: Progressing   Problem: Cardiac: Goal: Ability to achieve and maintain adequate cardiopulmonary perfusion will improve Outcome: Progressing

## 2024-01-02 NOTE — Progress Notes (Addendum)
 Patient ID: [name], female   DOB: [birth year], 78 y.o.   MRN: [id number]     Advanced Heart Failure Rounding Note  Cardiologist: Alm Clay, MD  Chief Complaint: Heart Failure Subjective:   11/11 Moved out of ICU. Irbesartan started.   Denies SOB. Afraid to move her leg.    Objective:   Weight Range: 57.8 kg Body mass index is 22.22 kg/m.   Vital Signs:   Temp:  [98.1 F (36.7 C)-99.2 F (37.3 C)] 98.8 F (37.1 C) (11/12 0758) Pulse Rate:  [63-84] 84 (11/12 0758) Resp:  [14-18] 16 (11/12 0758) BP: (120-164)/(60-82) 164/82 (11/12 0758) SpO2:  [95 %-100 %] 95 % (11/12 0758) Last BM Date : 12/31/23  Weight change: Filed Weights   12/28/23 1750 12/29/23 0417  Weight: 56.7 kg 57.8 kg    Intake/Output:   Intake/Output Summary (Last 24 hours) at 01/02/2024 0903 Last data filed at 01/02/2024 0341 Gross per 24 hour  Intake 120 ml  Output 450 ml  Net -330 ml     Physical Exam  General:   No resp difficulty Neck: no JVD.  Cor: Regular rate & rhythm.  Lungs: clear Abdomen: soft, nontender, nondistended.  Extremities: no  edema Neuro: alert & oriented x3  Telemetry   Paced 70-80s   Labs    CBC Recent Labs    01/01/24 0215 01/02/24 0428  WBC 5.5 5.1  HGB 9.1* 9.4*  HCT 26.9* 27.9*  MCV 92.4 93.3  PLT 157 162   Basic Metabolic Panel Recent Labs    88/88/74 0215 01/02/24 0428  NA 136 138  K 4.7 5.2*  CL 106 107  CO2 24 19*  GLUCOSE 189* 184*  BUN 60* 44*  CREATININE 1.98* 1.70*  CALCIUM  8.8* 9.0  MG 2.2 2.2   Liver Function Tests Recent Labs    01/02/24 0428  AST 13*  ALT 31  ALKPHOS 74  BILITOT 1.0  PROT 5.7*  ALBUMIN 2.9*       Medications:     Scheduled Medications:  atorvastatin   40 mg Oral Daily   Chlorhexidine  Gluconate Cloth  6 each Topical Daily   insulin  aspart  0-15 Units Subcutaneous TID WC   insulin  aspart  0-5 Units Subcutaneous QHS   irbesartan  300 mg Oral Daily   polyethylene glycol  17 g Oral Daily    senna  1 tablet Oral Daily   sodium chloride  flush  10-40 mL Intracatheter Q12H   sodium zirconium cyclosilicate   5 g Oral Daily    Infusions:    PRN Medications: acetaminophen , bisacodyl , fentaNYL  (SUBLIMAZE ) injection, lidocaine , ondansetron  **OR** ondansetron  (ZOFRAN ) IV, mouth rinse, oxyCODONE , sodium chloride  flush   Assessment/Plan   Complete heart block: With syncope prior to admission.  Suspect due to conduction system degeneration.  She was on Coreg  3.125 mg bid at home, minimal beta blockade.  Troponin mildly elevated with no trend, doubt ACS.  She had prior cath with nonobstructive CAD.  TVP placed 11/8 - S/P CRT-P 11/10 - EP following   Acute on chronic systolic CHF: H/o nonischemic CMP, last echo in 8/24 with EF 30-35%, moderate RV dysfunction.  Now with volume overload and AKI in setting of CHB.   - Holding entresto , coreg , MRA, and SGLT2 currently. Will not be able to restart Entresto  as patient assistance was stopped. Appears euvolemic.   Watch K trend. Give dose of lokelma . - Increase irbesartan 300 mg mg daily - Spiro when renal function improves - SGLT2i when more  mobile    AKI on CKD stage 3: AKI in setting of CHB and syncope/hypotension.  Creatinine peaked at 2.9--> today 1.7  R inferior pubic ramus fx - Post syncopal event on admission  PT/OT consults pending.   Jett Kulzer  NP-C   Advanced Heart Failure Team  01/02/24

## 2024-01-03 DIAGNOSIS — R001 Bradycardia, unspecified: Secondary | ICD-10-CM | POA: Diagnosis not present

## 2024-01-03 LAB — CBC
HCT: 29.2 % — ABNORMAL LOW (ref 36.0–46.0)
Hemoglobin: 10 g/dL — ABNORMAL LOW (ref 12.0–15.0)
MCH: 31.3 pg (ref 26.0–34.0)
MCHC: 34.2 g/dL (ref 30.0–36.0)
MCV: 91.3 fL (ref 80.0–100.0)
Platelets: 176 K/uL (ref 150–400)
RBC: 3.2 MIL/uL — ABNORMAL LOW (ref 3.87–5.11)
RDW: 13.6 % (ref 11.5–15.5)
WBC: 5.8 K/uL (ref 4.0–10.5)
nRBC: 0 % (ref 0.0–0.2)

## 2024-01-03 LAB — BASIC METABOLIC PANEL WITH GFR
Anion gap: 8 (ref 5–15)
BUN: 39 mg/dL — ABNORMAL HIGH (ref 8–23)
CO2: 23 mmol/L (ref 22–32)
Calcium: 9.3 mg/dL (ref 8.9–10.3)
Chloride: 104 mmol/L (ref 98–111)
Creatinine, Ser: 1.48 mg/dL — ABNORMAL HIGH (ref 0.44–1.00)
GFR, Estimated: 36 mL/min — ABNORMAL LOW (ref >60)
Glucose, Bld: 152 mg/dL — ABNORMAL HIGH (ref 70–99)
Potassium: 5.2 mmol/L — ABNORMAL HIGH (ref 3.5–5.1)
Sodium: 135 mmol/L (ref 135–145)

## 2024-01-03 LAB — GLUCOSE, CAPILLARY
Glucose-Capillary: 152 mg/dL — ABNORMAL HIGH (ref 70–99)
Glucose-Capillary: 259 mg/dL — ABNORMAL HIGH (ref 70–99)
Glucose-Capillary: 191 mg/dL — ABNORMAL HIGH (ref 70–99)
Glucose-Capillary: 166 mg/dL — ABNORMAL HIGH (ref 70–99)

## 2024-01-03 LAB — MAGNESIUM: Magnesium: 2.1 mg/dL (ref 1.7–2.4)

## 2024-01-03 MED ORDER — INSULIN GLARGINE-YFGN 100 UNIT/ML ~~LOC~~ SOLN
15.0000 [IU] | Freq: Every day | SUBCUTANEOUS | Status: DC
  Administered 2024-01-03: 15 [IU] via SUBCUTANEOUS
  Filled 2024-01-03 (×2): qty 0.15

## 2024-01-03 MED ORDER — CARVEDILOL 3.125 MG PO TABS
3.1250 mg | ORAL_TABLET | Freq: Two times a day (BID) | ORAL | Status: DC
  Administered 2024-01-03 – 2024-01-05 (×4): 3.125 mg via ORAL
  Filled 2024-01-03 (×4): qty 1

## 2024-01-03 MED ORDER — SODIUM ZIRCONIUM CYCLOSILICATE 5 G PO PACK
5.0000 g | PACK | Freq: Once | ORAL | Status: DC
  Filled 2024-01-03: qty 1

## 2024-01-03 MED ORDER — TORSEMIDE 20 MG PO TABS
20.0000 mg | ORAL_TABLET | Freq: Every day | ORAL | Status: DC
  Administered 2024-01-03 – 2024-01-05 (×3): 20 mg via ORAL
  Filled 2024-01-03 (×3): qty 1

## 2024-01-03 MED ORDER — SODIUM ZIRCONIUM CYCLOSILICATE 10 G PO PACK
10.0000 g | PACK | Freq: Once | ORAL | Status: AC
  Administered 2024-01-03: 10 g via ORAL
  Filled 2024-01-03: qty 1

## 2024-01-03 NOTE — Plan of Care (Signed)

## 2024-01-03 NOTE — Plan of Care (Signed)
  Problem: Education: Goal: Ability to describe self-care measures that may prevent or decrease complications (Diabetes Survival Skills Education) will improve Outcome: Progressing Goal: Individualized Educational Video(s) Outcome: Progressing   Problem: Coping: Goal: Ability to adjust to condition or change in health will improve Outcome: Progressing   Problem: Fluid Volume: Goal: Ability to maintain a balanced intake and output will improve Outcome: Progressing   Problem: Health Behavior/Discharge Planning: Goal: Ability to identify and utilize available resources and services will improve Outcome: Progressing Goal: Ability to manage health-related needs will improve Outcome: Progressing   Problem: Metabolic: Goal: Ability to maintain appropriate glucose levels will improve Outcome: Progressing   Problem: Nutritional: Goal: Maintenance of adequate nutrition will improve Outcome: Progressing Goal: Progress toward achieving an optimal weight will improve Outcome: Progressing   Problem: Skin Integrity: Goal: Risk for impaired skin integrity will decrease Outcome: Progressing   Problem: Tissue Perfusion: Goal: Adequacy of tissue perfusion will improve Outcome: Progressing   Problem: Education: Goal: Knowledge of General Education information will improve Description: Including pain rating scale, medication(s)/side effects and non-pharmacologic comfort measures Outcome: Progressing   Problem: Health Behavior/Discharge Planning: Goal: Ability to manage health-related needs will improve Outcome: Progressing   Problem: Clinical Measurements: Goal: Ability to maintain clinical measurements within normal limits will improve Outcome: Progressing Goal: Will remain free from infection Outcome: Progressing Goal: Diagnostic test results will improve Outcome: Progressing Goal: Respiratory complications will improve Outcome: Progressing Goal: Cardiovascular complication will  be avoided Outcome: Progressing   Problem: Activity: Goal: Risk for activity intolerance will decrease Outcome: Progressing   Problem: Nutrition: Goal: Adequate nutrition will be maintained Outcome: Progressing   Problem: Coping: Goal: Level of anxiety will decrease Outcome: Progressing   Problem: Elimination: Goal: Will not experience complications related to bowel motility Outcome: Progressing Goal: Will not experience complications related to urinary retention Outcome: Progressing   Problem: Pain Managment: Goal: General experience of comfort will improve and/or be controlled Outcome: Progressing   Problem: Safety: Goal: Ability to remain free from injury will improve Outcome: Progressing   Problem: Skin Integrity: Goal: Risk for impaired skin integrity will decrease Outcome: Progressing   Problem: Education: Goal: Knowledge of cardiac device and self-care will improve Outcome: Progressing Goal: Ability to safely manage health related needs after discharge will improve Outcome: Progressing Goal: Individualized Educational Video(s) Outcome: Progressing   Problem: Cardiac: Goal: Ability to achieve and maintain adequate cardiopulmonary perfusion will improve Outcome: Progressing

## 2024-01-03 NOTE — Progress Notes (Signed)
 PROGRESS NOTE    [name]  FMW:[id number] DOB: 09/08/1945 DOA: 12/28/2023 PCP: Regino Slater, MD  78/F with chronic systolic CHF, CKD, hypertension, type 2 diabetes mellitus was admitted with syncope and complete heart block to [facility] ICU.  - EP consulted underwent PPM implantation 11/10 Dr. Almetta   Subjective: -Weak, tired according to daughter, some dyspnea earlier  Assessment and Plan:  Complete heart block Syncope - On low-dose Coreg  3.125 at baseline, ruled out for ACS - Prior cath noted nonobstructive CAD Transvenousor placed 11/8 -sp PPM implantation 11/10 Dr. Almetta - Increase activity, PT OT assessment completed, CIR eval ongoing  Acute on chronic systolic CHF - History of known NICM, last echo 8/24 with EF 30-35%, moderately reduced RV - Volume status appears even - Advanced heart failure team following, Entresto  and SGLT2 on hold - Started on irbesartan, potassium high again, given Lokelma , may need to discontinue ARB - Appears euvolemic, starting torsemide, borderline candidate for SGLT2i  AKI on CKD 3 A - In the setting of low output, complete heart block, hypotension Now improving  Right inferior pubic ramus fracture - Following syncope and fall - PT OT eval today, discussed with daughter, they would prefer home therapy services  Type 2 diabetes mellitus - Uncontrolled, A1c is 8.14 days ago - Restarted Lantus ,, increased dose, ?  Jardiance  soon   DVT prophylaxis: SCDs Code Status: Full code Family Communication: Daughter at bedside Disposition Plan: Home likely tomorrow   Antimicrobials:    Objective: Vitals:   01/02/24 1946 01/02/24 2345 01/03/24 0345 01/03/24 0700  BP: (!) 156/77 (!) 160/73 (!) 157/81 (!) 156/69  Pulse: 73 74 69   Resp: 18 16 15    Temp: 97.9 F (36.6 C) 98 F (36.7 C) 98.3 F (36.8 C) 98.3 F (36.8 C)  TempSrc: Oral Oral Oral Oral  SpO2: 100% 98% 99%   Weight:      Height:        Intake/Output Summary (Last 24  hours) at 01/03/2024 0935 Last data filed at 01/03/2024 0730 Gross per 24 hour  Intake 440 ml  Output 2200 ml  Net -1760 ml   Filed Weights   12/28/23 1750 12/29/23 0417  Weight: 56.7 kg 57.8 kg    Examination:  General exam: Appears calm and comfortable, AO x 2, flat affect HEENT: No JVD, PPM site appears unremarkable Respiratory system: Clear to auscultation Cardiovascular system: S1 & S2 heard, RRR.  Abd: nondistended, soft and nontender.Normal bowel sounds heard. Central nervous system: Alert and oriented. No focal neurological deficits. Extremities: no edema Skin: No rashes Psychiatry:  Mood & affect appropriate.     Data Reviewed:   CBC: Recent Labs  Lab 12/28/23 1758 12/28/23 1813 12/30/23 0212 12/31/23 0913 01/01/24 0215 01/02/24 0428 01/03/24 0413  WBC 4.8   < > 5.8 6.0 5.5 5.1 5.8  NEUTROABS 3.1  --   --   --   --   --   --   HGB 10.3*   < > 8.8* 9.4* 9.1* 9.4* 10.0*  HCT 31.2*   < > 26.6* 28.0* 26.9* 27.9* 29.2*  MCV 94.8   < > 94.3 93.0 92.4 93.3 91.3  PLT 177   < > 149* 160 157 162 176   < > = values in this interval not displayed.   Basic Metabolic Panel: Recent Labs  Lab 12/28/23 1758 12/28/23 1813 12/30/23 0212 12/31/23 0913 01/01/24 0215 01/02/24 0428 01/03/24 0413  NA 136   < > 136 136 136  138 135  K 5.0   < > 5.0 5.0 4.7 5.2* 5.2*  CL 106   < > 103 105 106 107 104  CO2 17*   < > 21* 18* 24 19* 23  GLUCOSE 177*   < > 125* 145* 189* 184* 152*  BUN 81*   < > 84* 72* 60* 44* 39*  CREATININE 2.56*   < > 2.60* 2.06* 1.98* 1.70* 1.48*  CALCIUM  9.1   < > 9.1 9.0 8.8* 9.0 9.3  MG 2.1  --   --  1.9 2.2 2.2 2.1   < > = values in this interval not displayed.   GFR: Estimated Creatinine Clearance: 25.7 mL/min (A) (by C-G formula based on SCr of 1.48 mg/dL (H)). Liver Function Tests: Recent Labs  Lab 12/28/23 1758 12/29/23 0703 01/02/24 0428  AST 186* 93* 13*  ALT 229* 175* 31  ALKPHOS 116 95 74  BILITOT 0.8 1.0 1.0  PROT 6.7 6.3*  5.7*  ALBUMIN 3.7 3.5 2.9*   No results for input(s): LIPASE, AMYLASE in the last 168 hours. No results for input(s): AMMONIA in the last 168 hours. Coagulation Profile: No results for input(s): INR, PROTIME in the last 168 hours. Cardiac Enzymes: No results for input(s): CKTOTAL, CKMB, CKMBINDEX, TROPONINI in the last 168 hours. BNP (last 3 results) No results for input(s): PROBNP in the last 8760 hours. HbA1C: No results for input(s): HGBA1C in the last 72 hours. CBG: Recent Labs  Lab 01/02/24 0756 01/02/24 1148 01/02/24 1605 01/02/24 2108 01/03/24 0747  GLUCAP 180* 167* 215* 202* 152*   Lipid Profile: No results for input(s): CHOL, HDL, LDLCALC, TRIG, CHOLHDL, LDLDIRECT in the last 72 hours. Thyroid Function Tests: No results for input(s): TSH, T4TOTAL, FREET4, T3FREE, THYROIDAB in the last 72 hours. Anemia Panel: No results for input(s): VITAMINB12, FOLATE, FERRITIN, TIBC, IRON, RETICCTPCT in the last 72 hours. Urine analysis:    Component Value Date/Time   COLORURINE YELLOW 09/25/2022 0316   APPEARANCEUR HAZY (A) 09/25/2022 0316   LABSPEC 1.010 09/25/2022 0316   PHURINE 5.0 09/25/2022 0316   GLUCOSEU >=500 (A) 09/25/2022 0316   HGBUR MODERATE (A) 09/25/2022 0316   BILIRUBINUR NEGATIVE 09/25/2022 0316   KETONESUR NEGATIVE 09/25/2022 0316   PROTEINUR 30 (A) 09/25/2022 0316   UROBILINOGEN 0.2 04/28/2012 1757   NITRITE NEGATIVE 09/25/2022 0316   LEUKOCYTESUR TRACE (A) 09/25/2022 0316   Sepsis Labs: @LABRCNTIP (procalcitonin:4,lacticidven:4)  ) Recent Results (from the past 240 hours)  MRSA Next Gen by PCR, Nasal     Status: None   Collection Time: 12/29/23  4:44 AM   Specimen: Nasal Mucosa; Nasal Swab  Result Value Ref Range Status   MRSA by PCR Next Gen NOT DETECTED NOT DETECTED Final    Comment: (NOTE) The GeneXpert MRSA Assay (FDA approved for NASAL specimens only), is one component of a comprehensive  MRSA colonization surveillance program. It is not intended to diagnose MRSA infection nor to guide or monitor treatment for MRSA infections. Test performance is not FDA approved in patients less than 62 years old. Performed at [facility], [address] [place], [place] [address]   Surgical PCR screen     Status: None   Collection Time: 12/30/23  9:28 PM   Specimen: Nasal Mucosa; Nasal Swab  Result Value Ref Range Status   MRSA, PCR NEGATIVE NEGATIVE Final   Staphylococcus aureus NEGATIVE NEGATIVE Final    Comment: (NOTE) The Xpert SA Assay (FDA approved for NASAL specimens in patients  68 years of age and older), is one component of a comprehensive surveillance program. It is not intended to diagnose infection nor to guide or monitor treatment. Performed at [facility], [address] [place], [place] [address]      Radiology Studies: No results found.    Scheduled Meds:  atorvastatin   40 mg Oral Daily   carvedilol   3.125 mg Oral BID WC   Chlorhexidine  Gluconate Cloth  6 each Topical Daily   insulin  aspart  0-15 Units Subcutaneous TID WC   insulin  aspart  0-5 Units Subcutaneous QHS   insulin  glargine-yfgn  15 Units Subcutaneous Daily   irbesartan  300 mg Oral Daily   melatonin  5 mg Oral QHS   polyethylene glycol  17 g Oral Daily   senna  1 tablet Oral Daily   sodium chloride  flush  10-40 mL Intracatheter Q12H   Continuous Infusions:   LOS: 6 days    Time spent:    Sigurd Pac, MD [facility]   01/03/2024, 9:35 AM

## 2024-01-03 NOTE — Progress Notes (Signed)
 Inpatient Rehab Coordinator Note:  I met with patient and her daughter at bedside to discuss CIR recommendations and goals/expectations of CIR stay.  We reviewed 3 hrs/day of therapy, physician follow up, and average length of stay 2 weeks (dependent upon progress) with goals of supervision.  Pt and daughter both in agreement with pursuing CIR, and daughter states she can stay with pt during the days after discharge from rehab while son is at work.  We reviewed Medicare benefits.  I will follow for potential admit as soon as a bed is available.  Reche Lowers, PT, DPT Admissions Coordinator [phone] 01/03/24 2:15 PM

## 2024-01-03 NOTE — Evaluation (Addendum)
 Occupational Therapy Evaluation Patient Details Name: [name] MRN: [id number] DOB: March 11, 1945 Today's Date: 01/03/2024   History of Present Illness   78 yo F adm 12/28/23 with dizziness, syncope, bradycardia due to CHB with Rt inferior pubic rami fx. 11/8 temporary pacemaker. 11/10 PPM implant. PMHx: breast CA, CHF, CAD, HTN, HLD, COPD, PAD, macular degeneration, T2DM, NICM, CKD     Clinical Impressions Pt presents with decline in function and safety with ADLs and ADL mobility with impaired strength, balance, endurance, L UE PPM/NWB, hx of visual impairments/macular degeneration. PTA pt's son lives with her (he works during the day) and pt was Ind/Mod I with ADLs/selfcare, IADLs, meal prep, does not drive. Pt currently requires mod A to sit EOB, mod A with UB ADLs, max-total A with LB ADLs, max/mod A STS/SPTs. Pt requires reminder cues for L UE NWB. Pt's daughter present and very supportive. Pt's HR 80s-90s during activity. OT will continue to follow acutely to maximize level of function and safety     If plan is discharge home, recommend the following:   A lot of help with bathing/dressing/bathroom;A lot of help with walking and/or transfers;Assistance with cooking/housework;Help with stairs or ramp for entrance;Direct supervision/assist for medications management     Functional Status Assessment   Patient has had a recent decline in their functional status and demonstrates the ability to make significant improvements in function in a reasonable and predictable amount of time.     Equipment Recommendations   Wheelchair (measurements OT);Wheelchair cushion (measurements OT);BSC/3in1;Other (comment) (hemiwalker)     Recommendations for Other Services         Precautions/Restrictions   Precautions Precautions: ICD/Pacemaker Recall of Precautions/Restrictions: Intact Required Braces or Orthoses: Sling Restrictions Weight Bearing Restrictions Per Provider Order:  Yes LUE Weight Bearing Per Provider Order: Non weight bearing RLE Weight Bearing Per Provider Order: Weight bearing as tolerated Other Position/Activity Restrictions: LUE limited wt bearing and ROM post PPM     Mobility Bed Mobility Overal bed mobility: Needs Assistance Bed Mobility: Supine to Sit, Sit to Supine     Supine to sit: Mod assist, HOB elevated, Used rails Sit to supine: Max assist   General bed mobility comments: mod A with LEs and trunk elevation, max A with LE mgt back onto bed    Transfers Overall transfer level: Needs assistance Equipment used: 1 person hand held assist Transfers: Sit to/from Stand Sit to Stand: Max assist, Mod assist           General transfer comment: max A initial STS from EOB      Balance Overall balance assessment: Needs assistance Sitting-balance support: Feet supported, Single extremity supported Sitting balance-Leahy Scale: Poor   Postural control: Posterior lean Standing balance support: Single extremity supported, During functional activity Standing balance-Leahy Scale: Poor Standing balance comment: forward leaning/flexed posture, cues to correct                           ADL either performed or assessed with clinical judgement   ADL Overall ADL's : Needs assistance/impaired Eating/Feeding: Set up;Sitting   Grooming: Wash/dry hands;Wash/dry face;Contact guard assist;Sitting   Upper Body Bathing: Moderate assistance   Lower Body Bathing: Maximal assistance   Upper Body Dressing : Moderate assistance   Lower Body Dressing: Maximal assistance   Toilet Transfer: Moderate assistance;Stand-pivot;Cueing for safety;Cueing for sequencing   Toileting- Clothing Manipulation and Hygiene: Maximal assistance;Total assistance;Sitting/lateral lean;Sit to/from stand       Functional  mobility during ADLs: Maximal assistance;Moderate assistance;Cueing for safety;Cueing for sequencing General ADL Comments: may benefit  trial with hemiwalker     Vision Baseline Vision/History: 6 Macular Degeneration Ability to See in Adequate Light: 2 Moderately impaired Patient Visual Report: No change from baseline       Perception         Praxis         Pertinent Vitals/Pain Pain Assessment Pain Assessment: Faces Faces Pain Scale: Hurts even more Pain Location: R hip during STS Pain Descriptors / Indicators: Discomfort, Grimacing, Guarding Pain Intervention(s): Limited activity within patient's tolerance, Monitored during session, Repositioned     Extremity/Trunk Assessment Upper Extremity Assessment Upper Extremity Assessment: Generalized weakness;Right hand dominant;LUE deficits/detail LUE Deficits / Details: PPM, sling, NWB   Lower Extremity Assessment Lower Extremity Assessment: Defer to PT evaluation   Cervical / Trunk Assessment Cervical / Trunk Assessment: Normal   Communication Communication Communication: No apparent difficulties Factors Affecting Communication: Hearing impaired   Cognition Arousal: Alert Behavior During Therapy: WFL for tasks assessed/performed                                 Following commands: Intact       Cueing  General Comments   Cueing Techniques: Verbal cues;Tactile cues      Exercises     Shoulder Instructions      Home Living Family/patient expects to be discharged to:: Private residence Living Arrangements: Children Available Help at Discharge: Family;Available PRN/intermittently Type of Home: House Home Access: Level entry     Home Layout: Able to live on main level with bedroom/bathroom     Bathroom Shower/Tub: Producer, Television/film/video: Standard     Home Equipment: Rollator (4 wheels);Shower seat - built in;Grab bars - tub/shower;Hand held shower head   Additional Comments: pt's daughter reports that pt doesn't use RW much but when she does, she pushes down hard with her arms and leans very far forward       Prior Functioning/Environment Prior Level of Function : Independent/Modified Independent             Mobility Comments: did not use DME ADLs Comments: Ind with ADLs and IADLs, her son manages med set up    OT Problem List: Decreased strength;Impaired vision/perception;Decreased activity tolerance;Impaired balance (sitting and/or standing);Pain;Impaired UE functional use   OT Treatment/Interventions: Self-care/ADL training;Therapeutic exercise;Patient/family education;Balance training;Therapeutic activities;DME and/or AE instruction      OT Goals(Current goals can be found in the care plan section)   Acute Rehab OT Goals Patient Stated Goal: get better, go home OT Goal Formulation: With patient Time For Goal Achievement: 01/17/24 Potential to Achieve Goals: Good ADL Goals Pt Will Perform Grooming: with min assist;with contact guard assist;standing Pt Will Perform Upper Body Bathing: with min assist;with contact guard assist;sitting Pt Will Perform Lower Body Bathing: with mod assist;sitting/lateral leans Pt Will Perform Upper Body Dressing: with min assist;with contact guard assist;sitting Pt Will Transfer to Toilet: with mod assist;with min assist;ambulating;stand pivot transfer Pt Will Perform Toileting - Clothing Manipulation and hygiene: with mod assist;sitting/lateral leans;sit to/from stand   OT Frequency:  Min 2X/week    Co-evaluation              AM-PAC OT 6 Clicks Daily Activity     Outcome Measure Help from another person eating meals?: A Little Help from another person taking care of personal grooming?: A Little Help from  another person toileting, which includes using toliet, bedpan, or urinal?: Total Help from another person bathing (including washing, rinsing, drying)?: A Lot Help from another person to put on and taking off regular upper body clothing?: A Lot Help from another person to put on and taking off regular lower body clothing?: A Lot 6  Click Score: 13   End of Session Equipment Utilized During Treatment: Gait belt;Other (comment) (BSC) Nurse Communication: Mobility status  Activity Tolerance: Patient limited by fatigue;Patient limited by pain Patient left: in bed;with call bell/phone within reach;with family/visitor present  OT Visit Diagnosis: Unsteadiness on feet (R26.81);Other abnormalities of gait and mobility (R26.89);Muscle weakness (generalized) (M62.81);Pain Pain - Right/Left: Right Pain - part of body: Hip                Time: 8970-8944 OT Time Calculation (min): 26 min Charges:  OT General Charges $OT Visit: 1 Visit OT Evaluation $OT Eval Moderate Complexity: 1 Mod OT Treatments $Therapeutic Activity: 8-22 mins    [name] 01/03/2024, 1:02 PM

## 2024-01-03 NOTE — Progress Notes (Addendum)
 Patient ID: [name], female   DOB: 09-04-1945, 78 y.o.   MRN: [id number]     Advanced Heart Failure Rounding Note  Cardiologist: Alm Clay, MD  Chief Complaint: Heart Failure Subjective:   11/11 Moved out of ICU. Irbesartan started.  11/12 Irbesartan increased. Given dose of lokelma . Diuresed with IV lasix .   Complaining of hip pain.    Objective:   Weight Range: 57.8 kg Body mass index is 22.22 kg/m.   Vital Signs:   Temp:  [97.9 F (36.6 C)-98.8 F (37.1 C)] 98.3 F (36.8 C) (11/13 0345) Pulse Rate:  [67-84] 69 (11/13 0345) Resp:  [15-18] 15 (11/13 0345) BP: (129-164)/(64-82) 157/81 (11/13 0345) SpO2:  [95 %-100 %] 99 % (11/13 0345) Last BM Date : 12/31/23  Weight change: Filed Weights   12/28/23 1750 12/29/23 0417  Weight: 56.7 kg 57.8 kg    Intake/Output:   Intake/Output Summary (Last 24 hours) at 01/03/2024 0710 Last data filed at 01/02/2024 2300 Gross per 24 hour  Intake 440 ml  Output 1700 ml  Net -1260 ml     Physical Exam  General:   No resp difficulty Neck: no JVD.  Cor: Regular rate & rhythm. R upper chest incision approximated.   Lungs: clear Abdomen: soft, nontender, nondistended.  Extremities: no  edema Neuro: alert & oriented x3   Telemetry   V Paced 60-70s   Labs    CBC Recent Labs    01/02/24 0428 01/03/24 0413  WBC 5.1 5.8  HGB 9.4* 10.0*  HCT 27.9* 29.2*  MCV 93.3 91.3  PLT 162 176   Basic Metabolic Panel Recent Labs    88/87/74 0428 01/03/24 0413  NA 138 135  K 5.2* 5.2*  CL 107 104  CO2 19* 23  GLUCOSE 184* 152*  BUN 44* 39*  CREATININE 1.70* 1.48*  CALCIUM  9.0 9.3  MG 2.2 2.1   Liver Function Tests Recent Labs    01/02/24 0428  AST 13*  ALT 31  ALKPHOS 74  BILITOT 1.0  PROT 5.7*  ALBUMIN 2.9*       Medications:     Scheduled Medications:  atorvastatin   40 mg Oral Daily   Chlorhexidine  Gluconate Cloth  6 each Topical Daily   insulin  aspart  0-15 Units Subcutaneous TID WC    insulin  aspart  0-5 Units Subcutaneous QHS   insulin  glargine-yfgn  10 Units Subcutaneous Daily   irbesartan  300 mg Oral Daily   melatonin  5 mg Oral QHS   polyethylene glycol  17 g Oral Daily   senna  1 tablet Oral Daily   sodium chloride  flush  10-40 mL Intracatheter Q12H    Infusions:    PRN Medications: acetaminophen , bisacodyl , lidocaine , olopatadine, ondansetron  **OR** ondansetron  (ZOFRAN ) IV, mouth rinse, oxyCODONE , sodium chloride  flush   Assessment/Plan   Complete heart block: With syncope prior to admission.  Suspect due to conduction system degeneration.  She was on Coreg  3.125 mg bid at home, minimal beta blockade.  Troponin mildly elevated with no trend, doubt ACS.  She had prior cath with nonobstructive CAD.  TVP placed 11/8 - S/P CRT-P 11/10   Acute on chronic systolic CHF: H/o nonischemic CMP, last echo in 8/24 with EF 30-35%, moderate RV dysfunction.  Now with volume overload and AKI in setting of CHB.   - Holding entresto , coreg , MRA, and SGLT2 currently. Will not be able to restart Entresto  as patient assistance was stopped. -Volume status improved. Add torsemide 20 mg daily -K  unchanged after Lokelma  5 g. Today will  give 10 g Lokelma .  - Add coreg  3.125 mg twice a day.  - Continue irbesartan 300 mg mg daily - Consider adding bidil tomorrow  - Spiro when renal function improves - SGLT2i when more mobile   AKI on CKD stage 3: AKI in setting of CHB and syncope/hypotension.  Creatinine peaked at 2.9--> today 1.5  R inferior pubic ramus fx - Post syncopal event on admission  CIR consult pending.   Jesus Nevills  NP-C   Advanced Heart Failure Team  01/03/24

## 2024-01-04 ENCOUNTER — Telehealth: Payer: Self-pay

## 2024-01-04 ENCOUNTER — Other Ambulatory Visit: Payer: Self-pay

## 2024-01-04 ENCOUNTER — Inpatient Hospital Stay (HOSPITAL_COMMUNITY)

## 2024-01-04 ENCOUNTER — Other Ambulatory Visit (HOSPITAL_COMMUNITY): Payer: Self-pay

## 2024-01-04 LAB — GLUCOSE, CAPILLARY
Glucose-Capillary: 178 mg/dL — ABNORMAL HIGH (ref 70–99)
Glucose-Capillary: 181 mg/dL — ABNORMAL HIGH (ref 70–99)
Glucose-Capillary: 163 mg/dL — ABNORMAL HIGH (ref 70–99)
Glucose-Capillary: 145 mg/dL — ABNORMAL HIGH (ref 70–99)
Glucose-Capillary: 125 mg/dL — ABNORMAL HIGH (ref 70–99)

## 2024-01-04 LAB — BASIC METABOLIC PANEL WITH GFR
Anion gap: 6 (ref 5–15)
BUN: 39 mg/dL — ABNORMAL HIGH (ref 8–23)
CO2: 26 mmol/L (ref 22–32)
Calcium: 9 mg/dL (ref 8.9–10.3)
Chloride: 102 mmol/L (ref 98–111)
Creatinine, Ser: 1.69 mg/dL — ABNORMAL HIGH (ref 0.44–1.00)
GFR, Estimated: 30 mL/min — ABNORMAL LOW (ref >60)
Glucose, Bld: 240 mg/dL — ABNORMAL HIGH (ref 70–99)
Potassium: 5.1 mmol/L (ref 3.5–5.1)
Sodium: 134 mmol/L — ABNORMAL LOW (ref 135–145)

## 2024-01-04 LAB — MAGNESIUM: Magnesium: 1.9 mg/dL (ref 1.7–2.4)

## 2024-01-04 MED ORDER — ISOSORB DINITRATE-HYDRALAZINE 20-37.5 MG PO TABS: 1.0000 | ORAL_TABLET | Freq: Three times a day (TID) | ORAL | Status: DC

## 2024-01-04 MED ORDER — EPINEPHRINE PF 1 MG/ML IJ SOLN: 0.3000 mg | Freq: Once | INTRAMUSCULAR | Status: DC

## 2024-01-04 MED ORDER — TORSEMIDE 20 MG PO TABS: 20.0000 mg | ORAL_TABLET | Freq: Every day | ORAL | Status: DC

## 2024-01-04 MED ORDER — INSULIN GLARGINE-YFGN 100 UNIT/ML ~~LOC~~ SOLN
20.0000 [IU] | Freq: Every day | SUBCUTANEOUS | Status: DC
  Administered 2024-01-04 – 2024-01-05 (×2): 20 [IU] via SUBCUTANEOUS
  Filled 2024-01-04 (×2): qty 0.2

## 2024-01-04 MED ORDER — SODIUM CHLORIDE 0.9 % IV BOLUS: 1000.0000 mL | Freq: Once | INTRAVENOUS | Status: DC

## 2024-01-04 MED ORDER — ALBUTEROL SULFATE (2.5 MG/3ML) 0.083% IN NEBU
2.5000 mg | INHALATION_SOLUTION | Freq: Once | RESPIRATORY_TRACT | Status: AC | PRN
  Administered 2024-01-04: 2.5 mg via RESPIRATORY_TRACT

## 2024-01-04 MED ORDER — DIPHENHYDRAMINE HCL 50 MG/ML IJ SOLN: 25.0000 mg | Freq: Once | INTRAMUSCULAR | Status: DC

## 2024-01-04 MED ORDER — BISACODYL 5 MG PO TBEC: 5.0000 mg | DELAYED_RELEASE_TABLET | Freq: Every day | ORAL | 0 refills | Status: AC | PRN

## 2024-01-04 MED ORDER — MAGNESIUM SULFATE 2 GM/50ML IV SOLN
2.0000 g | Freq: Once | INTRAVENOUS | Status: AC
  Administered 2024-01-04: 2 g via INTRAVENOUS
  Filled 2024-01-04: qty 50

## 2024-01-04 MED ORDER — ALBUTEROL SULFATE (2.5 MG/3ML) 0.083% IN NEBU
INHALATION_SOLUTION | RESPIRATORY_TRACT | Status: AC
  Filled 2024-01-04: qty 3

## 2024-01-04 MED ORDER — ISOSORB DINITRATE-HYDRALAZINE 20-37.5 MG PO TABS
1.0000 | ORAL_TABLET | Freq: Three times a day (TID) | ORAL | Status: DC
  Administered 2024-01-04: 1 via ORAL
  Filled 2024-01-04: qty 1

## 2024-01-04 MED ORDER — ISOSORBIDE MONONITRATE ER 30 MG PO TB24: 30.0000 mg | ORAL_TABLET | Freq: Every day | ORAL | Status: DC

## 2024-01-04 MED ORDER — HYDRALAZINE HCL 25 MG PO TABS: 25.0000 mg | ORAL_TABLET | Freq: Three times a day (TID) | ORAL | Status: DC

## 2024-01-04 MED ORDER — SENNA 8.6 MG PO TABS: 1.0000 | ORAL_TABLET | Freq: Every day | ORAL | 0 refills | Status: DC

## 2024-01-04 MED ORDER — IRBESARTAN 300 MG PO TABS: 300.0000 mg | ORAL_TABLET | Freq: Every day | ORAL | Status: DC

## 2024-01-04 NOTE — TOC Transition Note (Addendum)
 Transition of Care [facility]) - Discharge Note   Patient Details  Name: [name] MRN: [id number] Date of Birth: 20-Jan-1946  [facility] CM/SW Contact:  Justina Delcia Czar, RN Phone Number: [phone] 01/04/2024, 11:49 AM   Clinical Narrative:    Jhonny Cella rep, Darleene that pt will go to IP rehab prior to home.   Scheduled dc to CIR for IP rehab.   Spoke to dtr, [place], and she will follow up with Medicare.gov for patient's Part D. Explained open enrollment ends 01/27/2024.   No CM needs identified.    Final next level of care: IP Rehab Facility Barriers to Discharge: No Barriers Identified   Patient Goals and CMS Choice Patient states their goals for this hospitalization and ongoing recovery are:: wants to feel better CMS Medicare.gov Compare Post Acute Care list provided to:: Patient Choice offered to / list presented to : Patient      Discharge Placement      Discharge Plan and Services Additional resources added to the After Visit Summary for     Discharge Planning Services: CM Consult Post Acute Care Choice: Home Health             HH Arranged: PT, RN [facility] Agency: [facility] Date [facility] Agency Contacted: 01/01/24 Time HH Agency Contacted: 1544 Representative spoke with at [facility] Agency: Darleene Gowda  Social Drivers of Health (SDOH) Interventions SDOH Screenings   Food Insecurity: No Food Insecurity (12/29/2023)  Housing: Low Risk  (12/29/2023)  Transportation Needs: No Transportation Needs (12/29/2023)  Utilities: Not At Risk (12/29/2023)  Alcohol Screen: Low Risk  (09/25/2022)  Financial Resource Strain: Low Risk  (09/25/2022)  Social Connections: Moderately Integrated (12/29/2023)  Tobacco Use: Medium Risk (12/28/2023)     Readmission Risk Interventions     No data to display

## 2024-01-04 NOTE — H&P (Signed)
 Physical Medicine and Rehabilitation Admission H&P    Chief Complaint  Patient presents with   Functional deficits due to debility     HPI: [name] is a 78 year old R handed female with PMHx chronic systolic HF, CAD, CKD3B with baseline Cr  of ~1.7, HTN, HLD, type 2 DM, tobacco use, breast cancer s/p lumpectomy/radiation and chemotherapy. The patient presented at [facility] on 12/28/2023, via EMS after experiencing a witnessed syncopal episode by the patient's daughter. It was reported that the patient hit their head on the refrigerator. The patient complained of increased shortness of breath on exertion and right hip pain, along with the sudden onset of nausea, vomiting, and diarrhea. Upon EMS arrival, the patient's heart rate was noted to be in the 30s, atropine was administered en route to the ED. CT head was unremarkable. A CT of the pelvis revealed a right inferior pubic rami fracture without displacement. patient was afebrile and hemodynamically stable.  Labs without any significant electrolyte abnormalities.  AKI noted with creatinine 2.56 elevated from baseline 1.7, Elevated BNP 2500, Troponin 120-130, mildly elevated AST/ALT at 229 and 186. Vital signs significant for bradycardia heart rate in the 30s.  An EKG showed a 2:1 AV block, and cardiology was consulted. Carvedilol  was washed out due to bradycardia, and glucagon was initiated. The patient was admitted to the ICU, bradycardia continued and the patient underwent temporary pacer placement on 11/8 and permanent pacemaker placement on 11/10. Heparin  was started for DVT prophylaxis, and Lipitor was held due to abnormal liver function tests. The patient was followed for acute on chronic systolic CHF, and Entresto  and SGLT2 were held. The patient was started on irbesartan. Potassium levels were borderline high, and the patient received lokelma , with levels now stable. The patient appeared euvolemic and was started on torsemide. For  the right inferior femur ramus fracture, orthopedics recommended conservative management, weight-bearing as tolerated. The patient's A1c was 8.4, and Lantus  was resumed with an increased dose. Prior to arrival the patient was independent at baseline using RW intermittently.  On chart review she lives with her son in a 1 level home.  Currently mod to max assist with mobility and basic ADLs. Therapy evaluations completed due to patient decreased functional mobility was admitted for a comprehensive rehab program.    Pt reports constipation- would like something to get cleaned out- she reports only 1 small BM since admission- otherwise feels bloated and so full! Feels like intestines are asleep.   Denies any pain or nausea. Denies SOB since Bidil stopped yesterday when her BP dropped dramatically with Bidil being started (and stopped) yesterday.   Notes that has poor vision and poor hearing.   Review of Systems  Constitutional:  Positive for malaise/fatigue.  HENT:  Positive for hearing loss.   Eyes:  Positive for blurred vision.       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    Cardiovascular:  Positive for leg swelling. Negative for chest pain and palpitations.  Gastrointestinal:  Positive for constipation. Negative for abdominal pain and nausea.       LBM except 1 small BM that was Liquid was prior to admission >1 week ago  Genitourinary:  Positive for urgency.  Musculoskeletal:  Positive for joint pain and myalgias.  Skin: Negative.   Neurological:  Positive for weakness. Negative for tingling and sensory change.  Endo/Heme/Allergies: Negative.   Psychiatric/Behavioral:  Negative.    All other systems reviewed and are negative.      Past Medical History:  Diagnosis Date   Breast cancer (HCC)    breast - left    Chronic diastolic CHF (congestive heart failure), NYHA class 2 (HCC)    a. cMRI 5/10: EF 38% // b. Echo 3/13/:  EF 50-55, Gr 1 DD // c. Echo 8/14: EF 40, inf/inf-septal HK, Gr 1 DD, mild MR // d. Echo 5/17: EF 40-45, inf HK, Gr 1 DD, mild MR, mild LAE, reduced RVSF, mild TR, PASP 33 // e. Echo 10/17: EF 40-45, Gr 2 DD, mild MR, severe LAE; => 09/2020 EF 50-55%, GrII DD.   Coronary artery disease, non-occlusive    a. nonobs by LHC in 2010 // b. Myoview  10/17: Lg infarct apex, distal ant and lat walls, no ischemia, EF 38; int risk  // c. LHC 10/17:  dLAD 15, pLCx 30, mRCA 40, LVEDP 15   Diabetes mellitus    NICM (nonischemic cardiomyopathy) (HCC)    Past Surgical History:  Procedure Laterality Date   APPENDECTOMY     BREAST LUMPECTOMY     lt breast   CARDIAC CATHETERIZATION N/A 12/20/2015   Procedure: Left Heart Cath and Coronary Angiography;  Surgeon: Lonni Hanson, MD:: Mild to moderate, nonobstructive coronary artery disease, including 15% distal LAD stenosis, 30% ulcerated mid LCx disease, and 40% mid RCA stenosis.  Upper normal left ventricular filling pressure (LVEDP 15 mmHg).   PACEMAKER IMPLANT N/A 12/31/2023   Procedure: PACEMAKER IMPLANT;  Surgeon: Almetta Donnice LABOR, MD;  Location: [facility] INVASIVE CV LAB;  Service: Cardiovascular;  Laterality: N/A;   RIGHT HEART CATH N/A 05/08/2016   Procedure: Right Heart Cath;  Surgeon: Toribio JONELLE Fuel, MD;  Location: [facility] INVASIVE CV LAB;; RA = 3, RV = 34/5, PA = 38/12 (21), PCW = 12; Fick cardiac output/index = 4.6/2.6; PVR = 1.9 WU, Ao sat = 96%; PA sat = 58%, 62%    TEMPORARY PACEMAKER Right 12/29/2023   Procedure: TEMPORARY PACEMAKER;  Surgeon: Anner Alm ORN, MD;  Location: [facility] INVASIVE CV LAB;  Service: Cardiovascular;  Laterality: Right;   TRANSTHORACIC ECHOCARDIOGRAM  11/2015   a) 11/2015: EF 40 and 45%.  GRII DD; b) 04/2016: EF 20% with grade 2 diastolic dysfunction   TRANSTHORACIC ECHOCARDIOGRAM  10/2016   a) EF 55-60%.  Normal LV function.  Mild DD.[name]  Normal valve Fxn.; b) 09/2020: EF 50 to 55%.  Normal function.  No R WMA.  Mild LVH.  GR 2 DD with  elevated LAP.-Mild LA dilation.  Moderately elevated PAP estimated RVSP 47 mmHg, and RAP 8 mmHg.   TRIGGER FINGER RELEASE Left 04/23/2017   Procedure: RELEASE TRIGGER FINGER TO LEFT FORTH FINGER;  Surgeon: Marcus Lung, MD;  Location: [facility];  Service: Plastics;  Laterality: Left;   Family History  Problem Relation Age of Onset   Stroke Father    Diabetes Maternal Grandmother    Social History:  reports that she quit smoking about 11 years ago. Her smoking use included cigarettes. She started smoking about 41 years ago. She has never used smokeless tobacco. She reports that she does not drink alcohol and does not use drugs. Allergies:  Allergies  Allergen Reactions   Pregabalin     Other Reaction(s): feeling fatigue drunk feeling, falls   Tape Rash    Pt can tolerate the paper tape   Medications Prior to Admission  Medication Sig Dispense Refill   atorvastatin  (  LIPITOR) 40 MG tablet Take 1 tablet by mouth once daily 30 tablet 0   bumetanide  (BUMEX ) 1 MG tablet Take 1 tablet by mouth once daily 90 tablet 3   carvedilol  (COREG ) 3.125 MG tablet Take 1 tablet (3.125 mg total) by mouth 2 (two) times daily with a meal. 180 tablet 1   Continuous Glucose Sensor (FREESTYLE LIBRE 3 SENSOR) MISC See admin instructions.     LANTUS  SOLOSTAR 100 UNIT/ML Solostar Pen Inject 20 Units into the skin in the morning.     Olopatadine HCl (PATADAY OP) Place 1 drop into both eyes daily as needed (for itching).     sacubitril -valsartan  (ENTRESTO ) 49-51 MG Take 1 tablet by mouth 2 (two) times daily. 180 tablet 3   spironolactone  (ALDACTONE ) 25 MG tablet Take 0.5 tablets (12.5 mg total) by mouth daily. 90 tablet 3   empagliflozin  (JARDIANCE ) 10 MG TABS tablet Take 1 tablet (10 mg total) by mouth daily before breakfast. (Patient not taking: Reported on 12/28/2023) 30 tablet 11      Home: Home Living Family/patient expects to be discharged to:: Inpatient rehab Living Arrangements:  Children Available Help at Discharge: Family, Available PRN/intermittently Type of Home: House Home Access: Level entry Home Layout: Able to live on main level with bedroom/bathroom Bathroom Shower/Tub: Health Visitor: Standard Home Equipment: Rollator (4 wheels), Shower seat - built in, [employer] [facility] - tub/shower, Hand held shower head Additional Comments: pt's daughter reports that pt doesn't use RW much but when she does, she pushes down hard with her arms and leans very far forward   Functional History: Prior Function Prior Level of Function : Independent/Modified Independent Mobility Comments: did not use DME ADLs Comments: Ind with ADLs and IADLs, her son manages med set up  Functional Status:  Mobility: Bed Mobility Overal bed mobility: Needs Assistance Bed Mobility: Supine to Sit, Sit to Supine Supine to sit: Mod assist, HOB elevated, Used rails Sit to supine: Max assist General bed mobility comments: mod A with LEs and trunk elevation, max A with LE mgt back onto bed Transfers Overall transfer level: Needs assistance Equipment used: 1 person hand held assist Transfers: Sit to/from Stand Sit to Stand: Max assist, Mod assist General transfer comment: max A initial STS from EOB      ADL: ADL Overall ADL's : Needs assistance/impaired Eating/Feeding: Set up, Sitting Grooming: Wash/dry hands, Wash/dry face, Contact guard assist, Sitting Upper Body Bathing: Moderate assistance Lower Body Bathing: Maximal assistance Upper Body Dressing : Moderate assistance Lower Body Dressing: Maximal assistance Toilet Transfer: Moderate assistance, Stand-pivot, Cueing for safety, Cueing for sequencing Toileting- Clothing Manipulation and Hygiene: Maximal assistance, Total assistance, Sitting/lateral lean, Sit to/from stand Functional mobility during ADLs: Maximal assistance, Moderate assistance, Cueing for safety, Cueing for sequencing General ADL Comments: may benefit  trial with hemiwalker  Cognition: Cognition Orientation Level: Oriented X4 Cognition Arousal: Alert Behavior During Therapy: WFL for tasks assessed/performed  Physical Exam: Blood pressure 126/65, pulse 60, temperature 98.2 F (36.8 C), temperature source Oral, resp. rate 16, height 5' 3.5 (1.613 m), weight 57.8 kg, SpO2 98%. Physical Exam Vitals and nursing note reviewed.  Constitutional:      Appearance: Normal appearance. She is normal weight.     Comments: Awake, alert, appropriate, turning head to focus with R eye; sitting up in bed- ate <25% of lunch, NAD  HENT:     Head: Normocephalic and atraumatic.     Right Ear: External ear normal.     Left Ear: External  ear normal.     Nose: Nose normal. No congestion.     Mouth/Throat:     Mouth: Mucous membranes are dry.     Pharynx: Oropharynx is clear. No oropharyngeal exudate.  Eyes:     General:        Right eye: No discharge.        Left eye: No discharge.     Comments: Blind in L eye Cannot see features or details with R eye No drainage, erythema seen  Cardiovascular:     Rate and Rhythm: Normal rate and regular rhythm.     Heart sounds: Normal heart sounds. No murmur heard.    No gallop.  Pulmonary:     Effort: Pulmonary effort is normal. No respiratory distress.     Breath sounds: Normal breath sounds. No wheezing, rhonchi or rales.  Abdominal:     General: There is distension.     Tenderness: There is no abdominal tenderness.     Comments: Mildly Distended/ bloated, NT; hypoactive BS; a little firm  Genitourinary:    Comments: Purewick in place with medium amber urine Musculoskeletal:     Cervical back: Neck supple.     Comments: Ue's 5-/5 except FA 4/5 on R and 4+/5 on L RLE- HF 2/5 limited due to pain; otherwise KE/KF 3+/5; and DF 4/5 and PF 4+/5 LLE- HF 3-/5; otherwise the same in KE/KF/DF/PF  as RLE  TTP around R hip- no incision  Skin:    General: Skin is warm and dry.     Comments: R forearm IV-  looks OK Skin dry- flaking on LE's  Neurological:     Mental Status: She is alert and oriented to person, place, and time.     Comments: Intact to light touch in LE's- except feet decreased- from her chronic neuropathy  Psychiatric:        Mood and Affect: Mood normal.        Behavior: Behavior normal.     Comments: A little flat- appears frustrated with bad food     Results for orders placed or performed during the hospital encounter of 12/28/23 (from the past 48 hours)  Glucose, capillary     Status: Abnormal   Collection Time: 01/02/24  4:05 PM  Result Value Ref Range   Glucose-Capillary 215 (H) 70 - 99 mg/dL    Comment: Glucose reference range applies only to samples taken after fasting for at least 8 hours.  Glucose, capillary     Status: Abnormal   Collection Time: 01/02/24  9:08 PM  Result Value Ref Range   Glucose-Capillary 202 (H) 70 - 99 mg/dL    Comment: Glucose reference range applies only to samples taken after fasting for at least 8 hours.  Basic metabolic panel     Status: Abnormal   Collection Time: 01/03/24  4:13 AM  Result Value Ref Range   Sodium 135 135 - 145 mmol/L   Potassium 5.2 (H) 3.5 - 5.1 mmol/L   Chloride 104 98 - 111 mmol/L   CO2 23 22 - 32 mmol/L   Glucose, Bld 152 (H) 70 - 99 mg/dL    Comment: Glucose reference range applies only to samples taken after fasting for at least 8 hours.   BUN 39 (H) 8 - 23 mg/dL   Creatinine, Ser 8.51 (H) 0.44 - 1.00 mg/dL   Calcium  9.3 8.9 - 10.3 mg/dL   GFR, Estimated 36 (L) >60 mL/min    Comment: (NOTE) Calculated using the  CKD-EPI Creatinine Equation (2021)    Anion gap 8 5 - 15    Comment: Performed at [facility], [address], [place], [place] [address]  CBC     Status: Abnormal   Collection Time: 01/03/24  4:13 AM  Result Value Ref Range   WBC 5.8 4.0 - 10.5 K/uL   RBC 3.20 (L) 3.87 - 5.11 MIL/uL   Hemoglobin 10.0 (L) 12.0 - 15.0 g/dL   HCT 70.7 (L) 63.9 - 53.9 %   MCV 91.3 80.0 - 100.0 fL    MCH 31.3 26.0 - 34.0 pg   MCHC 34.2 30.0 - 36.0 g/dL   RDW 86.3 88.4 - 84.4 %   Platelets 176 150 - 400 K/uL   nRBC 0.0 0.0 - 0.2 %    Comment: Performed at [facility], [address], [place], [place] [address]  Magnesium      Status: None   Collection Time: 01/03/24  4:13 AM  Result Value Ref Range   Magnesium  2.1 1.7 - 2.4 mg/dL    Comment: Performed at [facility], [address] [place], [place] [address]  Glucose, capillary     Status: Abnormal   Collection Time: 01/03/24  7:47 AM  Result Value Ref Range   Glucose-Capillary 152 (H) 70 - 99 mg/dL    Comment: Glucose reference range applies only to samples taken after fasting for at least 8 hours.  Glucose, capillary     Status: Abnormal   Collection Time: 01/03/24 12:06 PM  Result Value Ref Range   Glucose-Capillary 259 (H) 70 - 99 mg/dL    Comment: Glucose reference range applies only to samples taken after fasting for at least 8 hours.  Glucose, capillary     Status: Abnormal   Collection Time: 01/03/24  3:43 PM  Result Value Ref Range   Glucose-Capillary 191 (H) 70 - 99 mg/dL    Comment: Glucose reference range applies only to samples taken after fasting for at least 8 hours.  Glucose, capillary     Status: Abnormal   Collection Time: 01/03/24  9:26 PM  Result Value Ref Range   Glucose-Capillary 166 (H) 70 - 99 mg/dL    Comment: Glucose reference range applies only to samples taken after fasting for at least 8 hours.  Basic metabolic panel     Status: Abnormal   Collection Time: 01/04/24  3:24 AM  Result Value Ref Range   Sodium 134 (L) 135 - 145 mmol/L   Potassium 5.1 3.5 - 5.1 mmol/L   Chloride 102 98 - 111 mmol/L   CO2 26 22 - 32 mmol/L   Glucose, Bld 240 (H) 70 - 99 mg/dL    Comment: Glucose reference range applies only to samples taken after fasting for at least 8 hours.   BUN 39 (H) 8 - 23 mg/dL   Creatinine, Ser 8.30 (H) 0.44 - 1.00 mg/dL   Calcium  9.0 8.9 - 10.3 mg/dL   GFR, Estimated 30  (L) >60 mL/min    Comment: (NOTE) Calculated using the CKD-EPI Creatinine Equation (2021)    Anion gap 6 5 - 15    Comment: Performed at [facility], [address], [place], [place] [address]  Magnesium      Status: None   Collection Time: 01/04/24  3:24 AM  Result Value Ref Range   Magnesium  1.9 1.7 - 2.4 mg/dL    Comment: Performed at [facility], [address], [place],  [place] [address]  Glucose, capillary     Status: Abnormal   Collection Time: 01/04/24  7:44 AM  Result Value Ref Range   Glucose-Capillary 178 (H) 70 - 99 mg/dL    Comment: Glucose reference range applies only to samples taken after fasting for at least 8 hours.   Comment 1 QC Due   Glucose, capillary     Status: Abnormal   Collection Time: 01/04/24 11:27 AM  Result Value Ref Range   Glucose-Capillary 181 (H) 70 - 99 mg/dL    Comment: Glucose reference range applies only to samples taken after fasting for at least 8 hours.   No results found.    Blood pressure 126/65, pulse 60, temperature 98.2 F (36.8 C), temperature source Oral, resp. rate 16, height 5' 3.5 (1.613 m), weight 57.8 kg, SpO2 98%.  Medical Problem List and Plan: 1. Functional deficits secondary to Debility from  Acute on chronic CHF exacerbation after syncope with AV block/pacemaker placement  -patient may  shower- pacemaker placed 11/10  -ELOS/Goals: 14-16 days- mod I to supervision  2.  Antithrombotics: -DVT/anticoagulation:  Mechanical: Sequential compression devices, below knee Bilateral lower extremities Pharmaceutical: Lovenox  start in CIR  -antiplatelet therapy: N/A Venous Duplex pending.   3. Pain Management: Oxycodone , Tylenol  and lidocaine  patch prn.   4. Mood/Behavior/Sleep: LCSW to follow for evaluation and support when available.   -antipsychotic agents: n/a  -sleep: melatonin 5 mg   5. Neuropsych/cognition: This patient is capable of making decisions on her own behalf.  6. Skin/Wound Care: Routine  pressure relief measures. Monitor surgical site for infection.   7. Fluids/Electrolytes/Nutrition: Monitor I&O and weight. Follow up labs CBC/CMP     8. Complete Heart Block/Syncope: PPM implanted on 11/10, Coreg  3.125 mg.   9. Acute/Chronic systolic CHF: hx of Nonischemic Cardiomyopathy-Holding Entresto  and SGLT2 due to AKI on CKD3B. Continue Irbesartan and Lasix .  10. AKI on CKD IIIB: Lasix , monitor CMP. Cr 1.69 11/14  11. T2DM: uncontrolled, A1C 8.1- monitor glucose ac/hs with SSI, Lantus  resumed. Jardience to be resumed when mobility improves.   12. Right Inferior Pubic Rami Fx: conservative management, weightbearing as tolerated.   13. HLD: Lipitor 40 mg   14. Constipation: SennaK and Miralax.   -gave Sorbitol and SSE today after admission since has been >1 week since decent BM. 15. Hyperkalemia: Received Lokelma  x1, trend labs. K+ 5.0- will recheck labs after admission        Daphne LOISE Satterfield, NP 01/04/2024  I have personally performed a face to face diagnostic evaluation of this patient and formulated the key components of the plan.  Additionally, I have personally reviewed laboratory data, imaging studies, as well as relevant notes and concur with the physician assistant's documentation above.   The patient's status has not changed from the original H&P.  Any changes in documentation from the acute care chart have been noted above.

## 2024-01-04 NOTE — Progress Notes (Signed)
 Patient's BP decreased with SBP in the 90's. Patient has carvedilol  ordered. Notified MD, was told not to give at this time.

## 2024-01-04 NOTE — Care Management Important Message (Signed)
 Important Message  Patient Details  Name: [name] MRN: [id number] Date of Birth: 03/12/1945   Important Message Given:  Yes - Medicare IM     Vonzell Arrie Sharps 01/04/2024, 11:12 AM

## 2024-01-04 NOTE — Progress Notes (Signed)
 Babs Arthea DASEN, MD  Physician Physical Medicine and Rehabilitation   PMR Pre-admission    Signed   Date of Service: 01/04/2024 11:07 AM  Related encounter: ED to Hosp-Admission (Current) from 12/28/2023 in [facility]   Signed     Expand All Collapse All  PMR Admission Coordinator Pre-Admission Assessment   Patient: [name] is an 78 y.o., female MRN: [id number] DOB: [birth year] Height: 5' 3.5 (161.3 cm) Weight: 57.8 kg   Insurance Information HMO:    PPO:      PCP:      IPA:      80/20:      OTHER:  PRIMARY: Medicare Part A and B      Policy#: [id number]       Subscriber: [name] CM Name:       Phone#:      Fax#:  Pre-Cert#: verified Health And Safety Inspector:  Benefits:  Phone #:      Name:  Eff. Date: A 12/21/2008; B 02/20/2017     Deduct: $1676      Out of Pocket Max: n/a      Life Max: n/a CIR: 100%      SNF: 20 full days  Outpatient: 80%     Co-Pay: 20% Home Health: 100%      Co-Pay:  DME: 80%     Co-Pay: 20% Providers:  SECONDARY:       Policy#:      Phone#:    Artist:       Phone#:    [employer] Information Summary" for patients in Inpatient Rehabilitation Facilities with attached "Privacy Act Statement-Health Care Records" was provided and verbally reviewed with: Patient and Family   Emergency Contact Information Contact Information       Name Relation Home Work Mobile    [place] Daughter   [phone]    [name], [name] [phone]   [phone]         Other Contacts   None on File        Current Medical History  Patient Admitting Diagnosis: cardiac debility, R pubic rami fracture   History of Present Illness: [name] is an 78 y/o female with PMH of breast cancer, CHF, CAD, HTN, COPD, PAD< macular degeneration, DM, NICM, and CKD who was admitted to [facility] on 12/28/23 with dizziness, syncope, and fall.  When EMS arrived [name] HR noted to be in the 30s which was not reactive to atropine.  In ED she  remained bradycardic and EKG with complete heart block.  Cardiology was consulted and because [name] was mentating well and hemodynamically stable recommended no intervention.  She was admitted to ICU for close montioring.  She continued to remain bradycardic, as low as 21, and cardiology was reconsulted.  She underwent temporary pacer placement on 11/8 and PPM per Dr. Almetta on 11/10 after which she stabilized and was able to transfer out of the ICU.  Also noted on initial workup with a R pubic rami fracture which was determined to be nonsurgical per ortho.  Hospital course complicated by hyperpotassemia, AKI, and uncontrolled DM.  Therapy ongoing and [name] was recommended for CIR.    Patient's medical record from [name] has been reviewed by the rehabilitation admission coordinator and physician.   Past Medical History      Past Medical History:  Diagnosis Date   Breast cancer (HCC)      breast - left  Chronic diastolic CHF (congestive heart failure), NYHA class 2 (HCC)      a. cMRI 5/10: EF 38% // b. Echo 3/13/: EF 50-55, Gr 1 DD // c. Echo 8/14: EF 40, inf/inf-septal HK, Gr 1 DD, mild MR // d. Echo 5/17: EF 40-45, inf HK, Gr 1 DD, mild MR, mild LAE, reduced RVSF, mild TR, PASP 33 // e. Echo 10/17: EF 40-45, Gr 2 DD, mild MR, severe LAE; => 09/2020 EF 50-55%, GrII DD.   Coronary artery disease, non-occlusive      a. nonobs by LHC in 2010 // b. Myoview  10/17: Lg infarct apex, distal ant and lat walls, no ischemia, EF 38; int risk  // c. LHC 10/17:  dLAD 15, pLCx 30, mRCA 40, LVEDP 15   Diabetes mellitus     NICM (nonischemic cardiomyopathy) (HCC)            Has the patient had major surgery during 100 days prior to admission? Yes   Family History   family history includes Diabetes in her maternal grandmother; Stroke in her father.   Current Medications  Current Medications    Current Facility-Administered Medications:    acetaminophen  (TYLENOL ) tablet 325-650 mg, 325-650 mg, Oral, Q4H  PRN, Almetta Donnice LABOR, MD   atorvastatin  (LIPITOR) tablet 40 mg, 40 mg, Oral, Daily, Bowser, Grace E, NP, 40 mg at 01/04/24 9047   bisacodyl  (DULCOLAX) EC tablet 5 mg, 5 mg, Oral, Daily PRN, Anner Alm ORN, MD   carvedilol  (COREG ) tablet 3.125 mg, 3.125 mg, Oral, BID WC, Clegg, Amy D, NP, 3.125 mg at 01/04/24 9047   Chlorhexidine  Gluconate Cloth 2 % PADS 6 each, 6 each, Topical, Daily, Anner Alm ORN, MD, 6 each at 01/04/24 9047   insulin  aspart (novoLOG ) injection 0-15 Units, 0-15 Units, Subcutaneous, TID WC, Bowser, Ronnald BRAVO, NP, 3 Units at 01/04/24 0951   insulin  aspart (novoLOG ) injection 0-5 Units, 0-5 Units, Subcutaneous, QHS, Bowser, Grace E, NP, 2 Units at 01/02/24 2305   insulin  glargine-yfgn (SEMGLEE ) injection 20 Units, 20 Units, Subcutaneous, Daily, Fairy Frames, MD, 20 Units at 01/04/24 1016   irbesartan (AVAPRO) tablet 300 mg, 300 mg, Oral, Daily, Stoner, Benjamin J, MD, 300 mg at 01/04/24 0952   isosorbide-hydrALAZINE (BIDIL) 20-37.5 MG per tablet 1 tablet, 1 tablet, Oral, TID, Clegg, Amy D, NP   lidocaine  (LIDODERM ) 5 % 1 patch, 1 patch, Transdermal, Daily PRN, Bowser, Grace E, NP   magnesium  sulfate IVPB 2 g 50 mL, 2 g, Intravenous, Once, Zenaida Morene PARAS, MD, Last Rate: 50 mL/hr at 01/04/24 1010, 2 g at 01/04/24 1010   melatonin tablet 5 mg, 5 mg, Oral, QHS, Zenaida Morene PARAS, MD, 5 mg at 01/03/24 2132   olopatadine (PATANOL) 0.1 % ophthalmic solution 1 drop, 1 drop, Both Eyes, BID PRN, Fairy Frames, MD, 1 drop at 01/03/24 0950   ondansetron  (ZOFRAN ) tablet 4 mg, 4 mg, Oral, Q6H PRN **OR** ondansetron  (ZOFRAN ) injection 4 mg, 4 mg, Intravenous, Q6H PRN, Anner Alm ORN, MD, 4 mg at 12/31/23 1228   Oral care mouth rinse, 15 mL, Mouth Rinse, PRN, Anner Alm ORN, MD   oxyCODONE  (Oxy IR/ROXICODONE ) immediate release tablet 5 mg, 5 mg, Oral, Q6H PRN, Gretta Doffing P, DO, 5 mg at 12/31/23 0540   polyethylene glycol (MIRALAX / GLYCOLAX) packet 17 g, 17 g, Oral, Daily,  Gretta Doffing SQUIBB, DO, 17 g at 01/04/24 9047   senna (SENOKOT) tablet 8.6 mg, 1 tablet, Oral, Daily, Gretta Doffing P, DO, 8.6 mg at  01/04/24 0952   sodium chloride  flush (NS) 0.9 % injection 10-40 mL, 10-40 mL, Intracatheter, Q12H, Anner Alm ORN, MD, 10 mL at 01/04/24 0953   sodium chloride  flush (NS) 0.9 % injection 10-40 mL, 10-40 mL, Intracatheter, PRN, Anner Alm ORN, MD   torsemide [facility]) tablet 20 mg, 20 mg, Oral, Daily, Clegg, Amy D, NP, 20 mg at 01/04/24 9047     Patients Current Diet:  Diet Order                  Diet Carb Modified Fluid consistency: Thin; Room service appropriate? Yes  Diet effective now                         Precautions / Restrictions Precautions Precautions: ICD/Pacemaker Restrictions Weight Bearing Restrictions Per Provider Order: Yes LUE Weight Bearing Per Provider Order: Non weight bearing RLE Weight Bearing Per Provider Order: Weight bearing as tolerated Other Position/Activity Restrictions: LUE limited wt bearing and ROM post PPM    Has the patient had 2 or more falls or a fall with injury in the past year? Yes   Prior Activity Level Limited Community (1-2x/wk): independent at baseline without DME, lives with son, alone during the day, does not drive, manages meds with set up and meals without assist   Prior Functional Level Self Care: Did the patient need help bathing, dressing, using the toilet or eating? Independent   Indoor Mobility: Did the patient need assistance with walking from room to room (with or without device)? Independent   Stairs: Did the patient need assistance with internal or external stairs (with or without device)? Independent   Functional Cognition: Did the patient need help planning regular tasks such as shopping or remembering to take medications? Independent   Patient Information Are you of Hispanic, Latino/a,or Spanish origin?: A. No, not of Hispanic, Latino/a, or Spanish origin What is your race?: B. Black  or African American Do you need or want an interpreter to communicate with a doctor or health care staff?: 0. No   Patient's Response To:  Health Literacy and Transportation Is the patient able to respond to health literacy and transportation needs?: Yes Health Literacy - How often do you need to have someone help you when you read instructions, pamphlets, or other written material from your doctor or pharmacy?: Never In the past 12 months, has lack of transportation kept you from medical appointments or from getting medications?: No In the past 12 months, has lack of transportation kept you from meetings, work, or from getting things needed for daily living?: No   Journalist, Newspaper / Equipment Home Equipment: Occupational Hygienist (4 wheels), Shower seat - built in, [employer] [facility] - tub/shower, Hand held shower head   Prior Device Use: Indicate devices/aids used by the patient prior to current illness, exacerbation or injury? None of the above   Current Functional Level Cognition   Orientation Level: Oriented X4    Extremity Assessment (includes Sensation/Coordination)   Upper Extremity Assessment: Generalized weakness, Right hand dominant, LUE deficits/detail LUE Deficits / Details: PPM, sling, NWB  Lower Extremity Assessment: Defer to [name] evaluation RLE Deficits / Details: RLE limited by pain at hip, grossly 4-/5 to MMT RLE: Unable to fully assess due to pain RLE Sensation: history of peripheral neuropathy (bottom of feet) RLE Coordination: WNL     ADLs   Overall ADL's : Needs assistance/impaired Eating/Feeding: Set up, Sitting Grooming: Wash/dry hands, Wash/dry face, Contact guard assist, Sitting Upper  Body Bathing: Moderate assistance Lower Body Bathing: Maximal assistance Upper Body Dressing : Moderate assistance Lower Body Dressing: Maximal assistance Toilet Transfer: Moderate assistance, Stand-pivot, Cueing for safety, Cueing for sequencing Toileting- Clothing Manipulation and  Hygiene: Maximal assistance, Total assistance, Sitting/lateral lean, Sit to/from stand Functional mobility during ADLs: Maximal assistance, Moderate assistance, Cueing for safety, Cueing for sequencing General ADL Comments: may benefit trial with hemiwalker     Mobility   Overal bed mobility: Needs Assistance Bed Mobility: Supine to Sit, Sit to Supine Supine to sit: Mod assist, HOB elevated, Used rails Sit to supine: Max assist General bed mobility comments: mod A with LEs and trunk elevation, max A with LE mgt back onto bed     Transfers   Overall transfer level: Needs assistance Equipment used: 1 person hand held assist Transfers: Sit to/from Stand Sit to Stand: Max assist, Mod assist General transfer comment: max A initial STS from EOB     Ambulation / Gait / Stairs / Clinical Biochemist / Balance Balance Overall balance assessment: Needs assistance Sitting-balance support: Feet supported, Single extremity supported Sitting balance-Leahy Scale: Poor Postural control: Posterior lean Standing balance support: Single extremity supported, During functional activity Standing balance-Leahy Scale: Poor Standing balance comment: forward leaning/flexed posture, cues to correct     Special considerations/life events  Skin PPM site and Diabetic management yes    Previous Home Environment (from acute therapy documentation) Living Arrangements: Children Available Help at Discharge: Family, Available PRN/intermittently Type of Home: House Home Layout: Able to live on main level with bedroom/bathroom Home Access: Level entry Bathroom Shower/Tub: Health Visitor: Standard Home Care Services: No Additional Comments: [name]'s daughter reports that [name] doesn't use RW much but when she does, she pushes down hard with her arms and leans very far forward   Discharge Living Setting Plans for Discharge Living Setting: Patient's home, Lives with (comment) (son) Type  of Home at Discharge: House Discharge Home Layout: Able to live on main level with bedroom/bathroom Discharge Home Access: Level entry Discharge Bathroom Shower/Tub: Walk-in shower Discharge Bathroom Toilet: Standard Discharge Bathroom Accessibility: Yes How Accessible: Accessible via walker Does the patient have any problems obtaining your medications?: No   Social/Family/Support Systems Anticipated Caregiver: daughter days, son nights/evenings Anticipated Caregiver's Contact Information: [name]  [phone]; [name] [phone] Ability/Limitations of Caregiver: none stated Caregiver Availability: 24/7 Discharge Plan Discussed with Primary Caregiver: Yes Is Caregiver In Agreement with Plan?: Yes Does Caregiver/Family have Issues with Lodging/Transportation while [name] is in Rehab?: No   Goals Patient/Family Goal for Rehab: [name]/OT supervision to mod I, SLP n/a Expected length of stay: 14-16 days Additional Information: Discharge plan: return to [name]'s previous living arrangements with son, daughter to come stay during the day while son is at work [name]/Family Agrees to Admission and willing to participate: Yes Program Orientation Provided & Reviewed with [name]/Caregiver Including Roles  & Responsibilities: Yes   Decrease burden of Care through IP rehab admission: n/a   Possible need for SNF placement upon discharge: not anticipated.  Plan for discharge back to [name]'s home where she will have 24/7 supervision from her son and daughter   Patient Condition: I have reviewed medical records from [facility], spoken with CM, and patient and daughter. I met with patient at the bedside for inpatient rehabilitation assessment.  Patient will benefit from ongoing [name] and OT, can actively participate in 3 hours of therapy a day 5 days of the week, and can  make measurable gains during the admission.  Patient will also benefit from the coordinated team approach during an Inpatient Acute Rehabilitation admission.   The patient will receive intensive therapy as well as Rehabilitation physician, nursing, social worker, and care management interventions.  Due to safety, skin/wound care, disease management, medication administration, pain management, and patient education the patient requires 24 hour a day rehabilitation nursing.  The patient is currently mod to max assist with mobility and basic ADLs.  Discharge setting and therapy post discharge at home with home health is anticipated.  Patient has agreed to participate in the Acute Inpatient Rehabilitation Program and will admit today.   Preadmission Screen Completed By:  Reche FORBES Lowers, [name], DPT 01/04/2024 11:07 AM ______________________________________________________________________   Discussed status with Dr. Babs  on 01/04/24  at 11:22 AM  and received approval for admission today.   Admission Coordinator:  Nelda Luckey E Roark Rufo, [name], DPT time 11:22 AM Pattricia 01/04/24     Assessment/Plan: Diagnosis: debility, pelvic fx Does the need for close, 24 hr/day Medical supervision in concert with the patient's rehab needs make it unreasonable for this patient to be served in a less intensive setting? Yes Co-Morbidities requiring supervision/potential complications: bradycardia, cad, chf, htn Due to bladder management, bowel management, safety, skin/wound care, disease management, medication administration, pain management, and patient education, does the patient require 24 hr/day rehab nursing? Yes Does the patient require coordinated care of a physician, rehab nurse, [name], OT to address physical and functional deficits in the context of the above medical diagnosis(es)? Yes Addressing deficits in the following areas: balance, endurance, locomotion, strength, transferring, bowel/bladder control, bathing, dressing, feeding, grooming, toileting, and psychosocial support Can the patient actively participate in an intensive therapy program of at least 3 hrs of therapy 5 days a  week? Yes The potential for patient to make measurable gains while on inpatient rehab is excellent Anticipated functional outcomes upon discharge from inpatient rehab: modified independent and supervision [name], modified independent and supervision OT, n/a SLP Estimated rehab length of stay to reach the above functional goals is: 14-16 days Anticipated discharge destination: Home 10. Overall Rehab/Functional Prognosis: excellent     MD Signature: Arthea IVAR Babs, MD, [facility] Physical Medicine & Rehabilitation Medical Director Rehabilitation Services 01/04/2024           Revision History  Date/Time User Provider Type Action  01/04/2024 11:39 AM Babs Arthea DASEN, MD Physician Sign  01/04/2024 11:22 AM Lowers Reche FORBES, [name] Rehab Admission Coordinator Share   View Details Report

## 2024-01-04 NOTE — PMR Pre-admission (Signed)
 PMR Admission Coordinator Pre-Admission Assessment  Patient: [name] is an 78 y.o., female MRN: [id number] DOB: 21-Dec-1945 Height: 5' 3.5 (161.3 cm) Weight: 57.8 kg  Insurance Information HMO:    PPO:      PCP:      IPA:      80/20:      OTHER:  PRIMARY: Medicare Part A and B      Policy#: [id number]       Subscriber: pt CM Name:       Phone#:      Fax#:  Pre-Cert#: verified Health And Safety Inspector:  Benefits:  Phone #:      Name:  Eff. Date: A 12/21/2008; B 02/20/2017     Deduct: $1676      Out of Pocket Max: n/a      Life Max: n/a CIR: 100%      SNF: 20 full days  Outpatient: 80%     Co-Pay: 20% Home Health: 100%      Co-Pay:  DME: 80%     Co-Pay: 20% Providers:  SECONDARY:       Policy#:      Phone#:   Artist:       Phone#:   [employer] Engineer[employer] Information Summary" for patients in Inpatient Rehabilitation Facilities with attached "Privacy Act Statement-Health Care Records" was provided and verbally reviewed with: Patient and Family  Emergency Contact Information Contact Information     Name Relation Home Work Mobile   [name] Daughter  [phone]   Ashley, Cardenas [phone]  [phone]      Other Contacts   None on File     Current Medical History  Patient Admitting Diagnosis: cardiac debility, R pubic rami fracture  History of Present Illness: Pt is an 78 y/o female with PMH of breast cancer, CHF, CAD, HTN, COPD, PAD< macular degeneration, DM, NICM, and CKD who was admitted to [facility] on 12/28/23 with dizziness, syncope, and fall.  When EMS arrived pt HR noted to be in the 30s which was not reactive to atropine.  In ED she remained bradycardic and EKG with complete heart block.  Cardiology was consulted and because pt was mentating well and hemodynamically stable recommended no intervention.  She was admitted to ICU for close montioring.  She continued to remain bradycardic, as low as 21, and cardiology was reconsulted.  She  underwent temporary pacer placement on 11/8 and PPM per Dr. Almetta on 11/10 after which she stabilized and was able to transfer out of the ICU.  Also noted on initial workup with a R pubic rami fracture which was determined to be nonsurgical per ortho.  Hospital course complicated by hyperpotassemia, AKI, and uncontrolled DM.  Therapy ongoing and pt was recommended for CIR.     Patient's medical record from [name] has been reviewed by the rehabilitation admission coordinator and physician.  Past Medical History  Past Medical History:  Diagnosis Date   Breast cancer (HCC)    breast - left    Chronic diastolic CHF (congestive heart failure), NYHA class 2 (HCC)    a. cMRI 5/10: EF 38% // b. Echo 3/13/: EF 50-55, Gr 1 DD // c. Echo 8/14: EF 40, inf/inf-septal HK, Gr 1 DD, mild MR // d. Echo 5/17: EF 40-45, inf HK, Gr 1 DD, mild MR, mild LAE, reduced RVSF, mild TR, PASP 33 // e. Echo 10/17: EF 40-45, Gr 2 DD, mild MR, severe LAE; => 09/2020 EF 50-55%,  GrII DD.   Coronary artery disease, non-occlusive    a. nonobs by LHC in 2010 // b. Myoview  10/17: Lg infarct apex, distal ant and lat walls, no ischemia, EF 38; int risk  // c. LHC 10/17:  dLAD 15, pLCx 30, mRCA 40, LVEDP 15   Diabetes mellitus    NICM (nonischemic cardiomyopathy) (HCC)     Has the patient had major surgery during 100 days prior to admission? Yes  Family History   family history includes Diabetes in her maternal grandmother; Stroke in her father.  Current Medications  Current Facility-Administered Medications:    acetaminophen  (TYLENOL ) tablet 325-650 mg, 325-650 mg, Oral, Q4H PRN, Almetta Donnice LABOR, MD   atorvastatin  (LIPITOR) tablet 40 mg, 40 mg, Oral, Daily, Bowser, Grace E, NP, 40 mg at 01/04/24 9047   bisacodyl  (DULCOLAX) EC tablet 5 mg, 5 mg, Oral, Daily PRN, Anner Alm ORN, MD   carvedilol  (COREG ) tablet 3.125 mg, 3.125 mg, Oral, BID WC, Clegg, Amy D, NP, 3.125 mg at 01/04/24 9047   Chlorhexidine  Gluconate  Cloth 2 % PADS 6 each, 6 each, Topical, Daily, Anner Alm ORN, MD, 6 each at 01/04/24 9047   insulin  aspart (novoLOG ) injection 0-15 Units, 0-15 Units, Subcutaneous, TID WC, Bowser, Ronnald BRAVO, NP, 3 Units at 01/04/24 9048   insulin  aspart (novoLOG ) injection 0-5 Units, 0-5 Units, Subcutaneous, QHS, Bowser, Grace E, NP, 2 Units at 01/02/24 2305   insulin  glargine-yfgn (SEMGLEE ) injection 20 Units, 20 Units, Subcutaneous, Daily, Fairy Frames, MD, 20 Units at 01/04/24 1016   irbesartan (AVAPRO) tablet 300 mg, 300 mg, Oral, Daily, Stoner, Benjamin J, MD, 300 mg at 01/04/24 9047   isosorbide-hydrALAZINE (BIDIL) 20-37.5 MG per tablet 1 tablet, 1 tablet, Oral, TID, Clegg, Amy D, NP   lidocaine  (LIDODERM ) 5 % 1 patch, 1 patch, Transdermal, Daily PRN, Bowser, Grace E, NP   magnesium  sulfate IVPB 2 g 50 mL, 2 g, Intravenous, Once, Zenaida Morene PARAS, MD, Last Rate: 50 mL/hr at 01/04/24 1010, 2 g at 01/04/24 1010   melatonin tablet 5 mg, 5 mg, Oral, QHS, Zenaida Morene PARAS, MD, 5 mg at 01/03/24 2132   olopatadine (PATANOL) 0.1 % ophthalmic solution 1 drop, 1 drop, Both Eyes, BID PRN, Fairy Frames, MD, 1 drop at 01/03/24 0950   ondansetron  (ZOFRAN ) tablet 4 mg, 4 mg, Oral, Q6H PRN **OR** ondansetron  (ZOFRAN ) injection 4 mg, 4 mg, Intravenous, Q6H PRN, Anner Alm ORN, MD, 4 mg at 12/31/23 1228   Oral care mouth rinse, 15 mL, Mouth Rinse, PRN, Anner Alm ORN, MD   oxyCODONE  (Oxy IR/ROXICODONE ) immediate release tablet 5 mg, 5 mg, Oral, Q6H PRN, Gretta Doffing P, DO, 5 mg at 12/31/23 0540   polyethylene glycol (MIRALAX / GLYCOLAX) packet 17 g, 17 g, Oral, Daily, Gretta Doffing P, DO, 17 g at 01/04/24 9047   senna (SENOKOT) tablet 8.6 mg, 1 tablet, Oral, Daily, Gretta Doffing P, DO, 8.6 mg at 01/04/24 9047   sodium chloride  flush (NS) 0.9 % injection 10-40 mL, 10-40 mL, Intracatheter, Q12H, Anner Alm ORN, MD, 10 mL at 01/04/24 0953   sodium chloride  flush (NS) 0.9 % injection 10-40 mL, 10-40 mL,  Intracatheter, PRN, Anner Alm ORN, MD   torsemide [facility]) tablet 20 mg, 20 mg, Oral, Daily, Clegg, Amy D, NP, 20 mg at 01/04/24 9047  Patients Current Diet:  Diet Order             Diet Carb Modified Fluid consistency: Thin; Room service appropriate? Yes  Diet effective  now                   Precautions / Restrictions Precautions Precautions: ICD/Pacemaker Restrictions Weight Bearing Restrictions Per Provider Order: Yes LUE Weight Bearing Per Provider Order: Non weight bearing RLE Weight Bearing Per Provider Order: Weight bearing as tolerated Other Position/Activity Restrictions: LUE limited wt bearing and ROM post PPM   Has the patient had 2 or more falls or a fall with injury in the past year? Yes  Prior Activity Level Limited Community (1-2x/wk): independent at baseline without DME, lives with son, alone during the day, does not drive, manages meds with set up and meals without assist  Prior Functional Level Self Care: Did the patient need help bathing, dressing, using the toilet or eating? Independent  Indoor Mobility: Did the patient need assistance with walking from room to room (with or without device)? Independent  Stairs: Did the patient need assistance with internal or external stairs (with or without device)? Independent  Functional Cognition: Did the patient need help planning regular tasks such as shopping or remembering to take medications? Independent  Patient Information Are you of Hispanic, Latino/a,or Spanish origin?: A. No, not of Hispanic, Latino/a, or Spanish origin What is your race?: B. Black or African American Do you need or want an interpreter to communicate with a doctor or health care staff?: 0. No  Patient's Response To:  Health Literacy and Transportation Is the patient able to respond to health literacy and transportation needs?: Yes Health Literacy - How often do you need to have someone help you when you read instructions,  pamphlets, or other written material from your doctor or pharmacy?: Never In the past 12 months, has lack of transportation kept you from medical appointments or from getting medications?: No In the past 12 months, has lack of transportation kept you from meetings, work, or from getting things needed for daily living?: No  Journalist, Newspaper / Equipment Home Equipment: Occupational Hygienist (4 wheels), Shower seat - built in, [employer] [facility] - tub/shower, Hand held shower head  Prior Device Use: Indicate devices/aids used by the patient prior to current illness, exacerbation or injury? None of the above  Current Functional Level Cognition  Orientation Level: Oriented X4    Extremity Assessment (includes Sensation/Coordination)  Upper Extremity Assessment: Generalized weakness, Right hand dominant, LUE deficits/detail LUE Deficits / Details: PPM, sling, NWB  Lower Extremity Assessment: Defer to PT evaluation RLE Deficits / Details: RLE limited by pain at hip, grossly 4-/5 to MMT RLE: Unable to fully assess due to pain RLE Sensation: history of peripheral neuropathy (bottom of feet) RLE Coordination: WNL    ADLs  Overall ADL's : Needs assistance/impaired Eating/Feeding: Set up, Sitting Grooming: Wash/dry hands, Wash/dry face, Contact guard assist, Sitting Upper Body Bathing: Moderate assistance Lower Body Bathing: Maximal assistance Upper Body Dressing : Moderate assistance Lower Body Dressing: Maximal assistance Toilet Transfer: Moderate assistance, Stand-pivot, Cueing for safety, Cueing for sequencing Toileting- Clothing Manipulation and Hygiene: Maximal assistance, Total assistance, Sitting/lateral lean, Sit to/from stand Functional mobility during ADLs: Maximal assistance, Moderate assistance, Cueing for safety, Cueing for sequencing General ADL Comments: may benefit trial with hemiwalker    Mobility  Overal bed mobility: Needs Assistance Bed Mobility: Supine to Sit, Sit to Supine Supine to  sit: Mod assist, HOB elevated, Used rails Sit to supine: Max assist General bed mobility comments: mod A with LEs and trunk elevation, max A with LE mgt back onto bed    Transfers  Overall transfer  level: Needs assistance Equipment used: 1 person hand held assist Transfers: Sit to/from Stand Sit to Stand: Max assist, Mod assist General transfer comment: max A initial STS from EOB    Ambulation / Gait / Stairs / Wheelchair Mobility       Posture / Balance Balance Overall balance assessment: Needs assistance Sitting-balance support: Feet supported, Single extremity supported Sitting balance-Leahy Scale: Poor Postural control: Posterior lean Standing balance support: Single extremity supported, During functional activity Standing balance-Leahy Scale: Poor Standing balance comment: forward leaning/flexed posture, cues to correct    Special considerations/life events  Skin PPM site and Diabetic management yes   Previous Home Environment (from acute therapy documentation) Living Arrangements: Children Available Help at Discharge: Family, Available PRN/intermittently Type of Home: House Home Layout: Able to live on main level with bedroom/bathroom Home Access: Level entry Bathroom Shower/Tub: Health Visitor: Standard Home Care Services: No Additional Comments: pt's daughter reports that pt doesn't use RW much but when she does, she pushes down hard with her arms and leans very far forward  Discharge Living Setting Plans for Discharge Living Setting: Patient's home, Lives with (comment) (son) Type of Home at Discharge: House Discharge Home Layout: Able to live on main level with bedroom/bathroom Discharge Home Access: Level entry Discharge Bathroom Shower/Tub: Walk-in shower Discharge Bathroom Toilet: Standard Discharge Bathroom Accessibility: Yes How Accessible: Accessible via walker Does the patient have any problems obtaining your medications?:  No  Social/Family/Support Systems Anticipated Caregiver: daughter days, son nights/evenings Anticipated Caregiver's Contact Information: Rosaline  [phone]; [place] [phone] Ability/Limitations of Caregiver: none stated Caregiver Availability: 24/7 Discharge Plan Discussed with Primary Caregiver: Yes Is Caregiver In Agreement with Plan?: Yes Does Caregiver/Family have Issues with Lodging/Transportation while Pt is in Rehab?: No  Goals Patient/Family Goal for Rehab: PT/OT supervision to mod I, SLP n/a Expected length of stay: 14-16 days Additional Information: Discharge plan: return to pt's previous living arrangements with son, daughter to come stay during the day while son is at work Pt/Family Agrees to Admission and willing to participate: Yes Program Orientation Provided & Reviewed with Pt/Caregiver Including Roles  & Responsibilities: Yes  Decrease burden of Care through IP rehab admission: n/a  Possible need for SNF placement upon discharge: not anticipated.  Plan for discharge back to pt's home where she will have 24/7 supervision from her son and daughter  Patient Condition: I have reviewed medical records from [facility], spoken with CM, and patient and daughter. I met with patient at the bedside for inpatient rehabilitation assessment.  Patient will benefit from ongoing PT and OT, can actively participate in 3 hours of therapy a day 5 days of the week, and can make measurable gains during the admission.  Patient will also benefit from the coordinated team approach during an Inpatient Acute Rehabilitation admission.  The patient will receive intensive therapy as well as Rehabilitation physician, nursing, social worker, and care management interventions.  Due to safety, skin/wound care, disease management, medication administration, pain management, and patient education the patient requires 24 hour a day rehabilitation nursing.  The patient is currently mod to max assist with  mobility and basic ADLs.  Discharge setting and therapy post discharge at home with home health is anticipated.  Patient has agreed to participate in the Acute Inpatient Rehabilitation Program and will admit today.  Preadmission Screen Completed By:  Reche FORBES Lowers, PT, DPT 01/04/2024 11:07 AM ______________________________________________________________________   Discussed status with Dr. Babs  on 01/04/24  at 11:22 AM  and  received approval for admission today.  Admission Coordinator:  Caitlin E Warren, PT, DPT time 11:22 AM Pattricia 01/04/24    Assessment/Plan: Diagnosis: debility, pelvic fx Does the need for close, 24 hr/day Medical supervision in concert with the patient's rehab needs make it unreasonable for this patient to be served in a less intensive setting? Yes Co-Morbidities requiring supervision/potential complications: bradycardia, cad, chf, htn Due to bladder management, bowel management, safety, skin/wound care, disease management, medication administration, pain management, and patient education, does the patient require 24 hr/day rehab nursing? Yes Does the patient require coordinated care of a physician, rehab nurse, PT, OT to address physical and functional deficits in the context of the above medical diagnosis(es)? Yes Addressing deficits in the following areas: balance, endurance, locomotion, strength, transferring, bowel/bladder control, bathing, dressing, feeding, grooming, toileting, and psychosocial support Can the patient actively participate in an intensive therapy program of at least 3 hrs of therapy 5 days a week? Yes The potential for patient to make measurable gains while on inpatient rehab is excellent Anticipated functional outcomes upon discharge from inpatient rehab: modified independent and supervision PT, modified independent and supervision OT, n/a SLP Estimated rehab length of stay to reach the above functional goals is: 14-16 days Anticipated discharge  destination: Home 10. Overall Rehab/Functional Prognosis: excellent   MD Signature: Arthea IVAR Gunther, MD, [facility] Physical Medicine & Rehabilitation Medical Director Rehabilitation Services 01/04/2024

## 2024-01-04 NOTE — Progress Notes (Signed)
 Inpatient Rehab Admissions Coordinator:   I have a bed available for this patient to admit to CIR today.  Dr. Fairy in agreement and [facility] aware.  I spoke to pt's daughter on the phone and pt/family in agreement to admit to CIR today.  I will make arrangements.   Reche Lowers, PT, DPT Admissions Coordinator [phone] 01/04/24 11:04 AM

## 2024-01-04 NOTE — Progress Notes (Addendum)
 Patient ID: [name], female   DOB: 05-May-1945, 78 y.o.   MRN: [id number]     Advanced Heart Failure Rounding Note  Cardiologist: Alm Clay, MD  Chief Complaint: Heart Failure Subjective:   11/11 Moved out of ICU. Irbesartan started.  11/12 Irbesartan increased. Given dose of lokelma . Diuresed with IV lasix .  11/13- Given lokelma  and started on coreg .     Denies pain. Complains of hip pain when trying to stand.   Objective:   Weight Range: 57.8 kg Body mass index is 22.22 kg/m.   Vital Signs:   Temp:  [97.8 F (36.6 C)-98.8 F (37.1 C)] 98 F (36.7 C) (11/14 0410) Pulse Rate:  [61-68] 66 (11/14 0410) Resp:  [14-18] 15 (11/14 0410) BP: (119-150)/(53-83) 150/83 (11/14 0410) SpO2:  [96 %-100 %] 99 % (11/14 0410) Last BM Date : 01/03/24  Weight change: Filed Weights   12/28/23 1750 12/29/23 0417  Weight: 56.7 kg 57.8 kg    Intake/Output:   Intake/Output Summary (Last 24 hours) at 01/04/2024 0724 Last data filed at 01/04/2024 0700 Gross per 24 hour  Intake 400 ml  Output 1525 ml  Net -1125 ml     Physical Exam  General:   No resp difficulty Neck: no JVD.  Cor: Regular rate & rhythm. L upper chest scar.   Lungs: clear Abdomen: soft, nontender, nondistended.  Extremities: no  edema Neuro: alert & oriented x3   Telemetry   V paced 60-70s   Labs    CBC Recent Labs    01/02/24 0428 01/03/24 0413  WBC 5.1 5.8  HGB 9.4* 10.0*  HCT 27.9* 29.2*  MCV 93.3 91.3  PLT 162 176   Basic Metabolic Panel Recent Labs    88/86/74 0413 01/04/24 0324  NA 135 134*  K 5.2* 5.1  CL 104 102  CO2 23 26  GLUCOSE 152* 240*  BUN 39* 39*  CREATININE 1.48* 1.69*  CALCIUM  9.3 9.0  MG 2.1 1.9   Liver Function Tests Recent Labs    01/02/24 0428  AST 13*  ALT 31  ALKPHOS 74  BILITOT 1.0  PROT 5.7*  ALBUMIN 2.9*       Medications:     Scheduled Medications:  atorvastatin   40 mg Oral Daily   carvedilol   3.125 mg Oral BID WC   Chlorhexidine   Gluconate Cloth  6 each Topical Daily   insulin  aspart  0-15 Units Subcutaneous TID WC   insulin  aspart  0-5 Units Subcutaneous QHS   insulin  glargine-yfgn  15 Units Subcutaneous Daily   irbesartan  300 mg Oral Daily   melatonin  5 mg Oral QHS   polyethylene glycol  17 g Oral Daily   senna  1 tablet Oral Daily   sodium chloride  flush  10-40 mL Intracatheter Q12H   torsemide  20 mg Oral Daily    Infusions:  magnesium  sulfate bolus IVPB       PRN Medications: acetaminophen , bisacodyl , lidocaine , olopatadine, ondansetron  **OR** ondansetron  (ZOFRAN ) IV, mouth rinse, oxyCODONE , sodium chloride  flush   Assessment/Plan   Complete heart block: With syncope prior to admission.  Suspect due to conduction system degeneration.  She was on Coreg  3.125 mg bid at home, minimal beta blockade.  Troponin mildly elevated with no trend, doubt ACS.  She had prior cath with nonobstructive CAD.  TVP placed 11/8 - S/P CRT-P 11/10  Acute on chronic systolic CHF: H/o nonischemic CMP, last echo in 8/24 with EF 30-35%, moderate RV dysfunction.  Now with volume  overload and AKI in setting of CHB.   - Holding entresto , coreg , MRA, and SGLT2 currently. Will not be able to restart Entresto  as patient assistance was stopped. K stable.  -Volume status stable. Continue torsemide 20 mg daily -- Continue coreg  3.125 mg twice a day.  - Continue irbesartan 300 mg mg daily - Add bidil 1 tab three times a day.   - Spiro when renal function improves - SGLT2i when more mobile   AKI on CKD stage 3: AKI in setting of CHB and syncope/hypotension.  Creatinine peaked at 2.9--> today 1.7,   R inferior pubic ramus fx - Post syncopal event on admission  CIR   Amy Clegg  NP-C   Advanced Heart Failure Team  01/04/24   Patient seen and examined with the above-signed Advanced Practice Provider and/or Housestaff. I personally reviewed laboratory data, imaging studies and relevant notes. I independently examined the patient  and formulated the important aspects of the plan. I have edited the note to reflect any of my changes or salient points. I have personally discussed the plan with the patient and/or family.  Denies pain or SOB. Pacer site ok   General:  Sitting up in bed. No resp difficulty HEENT: normal Neck: supple. no JVD.  Cor: Regular rate & rhythm. No rubs, gallops or murmurs. Pacer site ok  Lungs: clear Abdomen: soft, nontender, nondistended.Good bowel sounds. Extremities: no cyanosis, clubbing, rash, edema Neuro: alert & orientedx3, cranial nerves grossly intact. moves all 4 extremities w/o difficulty. Affect pleasant  HF is stable. Pacer working well.  Agree with med changes as above. Planning CIR today.   Would follow potassium levels for a few days to make sure they are stable.   Will arrange outpatient HF f/u.   Toribio Fuel, MD  12:40 PM

## 2024-01-04 NOTE — Telephone Encounter (Signed)
 Follow-up after same day discharge: Implant date: 12/31/2023 MD: MCA Device: PPM Location: L chest    Wound check visit: 01/10/2024 90 day MD follow-up: 04/03/2024  Remote Transmission received:no, pt is currently in the hospital (rehab) LVM for daughter to call back about monitor   Dressing/sling removed: n/a  Confirm OAC restart on: LVM   Please continue to monitor your cardiac device site for redness, swelling, and drainage. Call the device clinic at [phone] if you experience these symptoms, fever/chills, or have questions about your device.   Remote monitoring is used to monitor your cardiac device from home. This monitoring is scheduled every 91 days by our office. It allows us  to keep an eye on the functioning of your device to ensure it is working properly.

## 2024-01-04 NOTE — Progress Notes (Signed)
 Inpatient Rehab Admissions Coordinator:   Events noted.  Discussed with Dr. Fairy and Dr. Babs.  Will plan to admit patient tomorrow (Saturday) 11/14.  Pt's nurse can call [phone] after 12 pm for report.    Reche Lowers, PT, DPT Admissions Coordinator [phone] 01/04/24 3:16 PM

## 2024-01-04 NOTE — Progress Notes (Signed)
 PT Cancellation Note  Patient Details Name: [name] MRN: [id number] DOB: 1945/11/10   Cancelled Treatment:    Reason Eval/Treat Not Completed: Medical issues which prohibited therapy.  Note rapid response call this afternoon and RN asks to Hold due to worries of pressures and response to therapy so close to rapid response.  Will hold and see as able 11/15. 01/04/2024  India HERO., PT Acute Rehabilitation Services [phone]  (office)   Vinie GAILS Cordero Surette 01/04/2024, 4:26 PM

## 2024-01-04 NOTE — Plan of Care (Signed)
  Problem: Education: Goal: Ability to describe self-care measures that may prevent or decrease complications (Diabetes Survival Skills Education) will improve Outcome: Progressing Goal: Individualized Educational Video(s) Outcome: Progressing   Problem: Fluid Volume: Goal: Ability to maintain a balanced intake and output will improve Outcome: Progressing   Problem: Health Behavior/Discharge Planning: Goal: Ability to identify and utilize available resources and services will improve Outcome: Progressing Goal: Ability to manage health-related needs will improve Outcome: Progressing

## 2024-01-04 NOTE — Significant Event (Signed)
 Rapid Response Event Note   Reason for Call :  Difficulty breathing, new med started this afternoon  Initial Focused Assessment:  Patient drowsy on arrival, audible upper airway wheezing present. Skin warm and dry. Follows commands, oriented. Lungs clear/diminished.   118/74 (88) HR 62 paced RR 10-20 O2 100% 4L [place]  Interventions/Plan of Care:  Albuterol  neb CXR MD to bedside; will discuss with HF regarding medications  Wheezing cleared, mentation improved. Weaned to RA.   Event Summary:  MD Notified: MYRTIS Pac MD Call Time: [id number] Arrival Time: 1421 End Time: 1500  Tonna Chiquita POUR, RN

## 2024-01-04 NOTE — Discharge Summary (Addendum)
 Physician Discharge Summary  STEPHENIE NAVEJAS FMW:[id number] DOB: 1945-04-14 DOA: 12/28/2023  PCP: Regino Slater, MD  Admit date: 12/28/2023 Discharge date: 01/05/2024  Time spent: 45 minutes  Recommendations for Outpatient Follow-up:  EP follow-up, new PPM in 2 weeks CIR for rehabilitation, please check BMP in few days CHF TOC clinic in few weeks  Discharge Diagnoses:  Syncope Complete heart block Acute on chronic systolic CHF AKI on CKD 3 A Pubic ramus fracture   Essential hypertension   Type 2 diabetes mellitus with hyperlipidemia (HCC)   NICM (nonischemic cardiomyopathy) (HCC)   Hyperlipidemia associated with type 2 diabetes mellitus (HCC)   Complete heart block (HCC)   Discharge Condition: Improved  Diet recommendation: Diabetic, low-sodium heart healthy  Filed Weights   12/28/23 1750 12/29/23 0417  Weight: 56.7 kg 57.8 kg    History of present illness:  78/F with chronic systolic CHF, CKD, hypertension, type 2 diabetes mellitus was admitted with syncope and complete heart block to [facility] ICU.,  Also noted to have a right inferior pubic ramus fracture - EP consulted underwent PPM implantation 11/10 Dr. Almetta Salvage Course:   Complete heart block Syncope - On low-dose Coreg  3.125 at baseline, ruled out for ACS - Prior cath noted nonobstructive CAD Transvenousor placed 11/8 -sp PPM implantation 11/10 Dr. Almetta - Increase activity, PT OT assessment completed, plan for CIR, low-dose Coreg  resumed now   Acute on chronic systolic CHF - History of known NICM, last echo 8/24 with EF 30-35%, moderately reduced RV - Volume status appears even - Advanced heart failure team following, Entresto  and SGLT2 on hold - Started on irbesartan, potassium borderline high, given Lokelma  yesterday, now stable - Appears euvolemic, starting torsemide, borderline candidate for SGLT2i at this time until mobility improves - Had a hypotensive episode after starting Bidil, this was  discontinued, BP improved today - Has follow-up with heart failure clinic, titrate GDMT at follow-up   AKI on CKD 3 A - In the setting of low output, complete heart block, hypotension Now improving   Right inferior pubic ramus fracture - Following syncope and fall -Case reviewed in ICU with orthopedics, recommended conservative management, weightbearing as tolerated - PT OT eval completed, CIR recommended, she will be discharged for rehabilitation   Type 2 diabetes mellitus - Uncontrolled, A1c is 8.1, 4 days ago - Restarted Lantus ,, increased dose, Jardiance  can be resumed down the road when mobility improves  Discharge Exam: Vitals:   01/05/24 0048 01/05/24 0449  BP: (!) 144/73 (!) 150/64  Pulse: 63 61  Resp: 12 15  Temp: 98.2 F (36.8 C) 98.4 F (36.9 C)  SpO2: 97% 98%   General exam: Appears calm and comfortable, AO x 2, flat affect HEENT: No JVD, PPM site appears unremarkable Respiratory system: Clear to auscultation Cardiovascular system: S1 & S2 heard, RRR.  Abd: nondistended, soft and nontender.Normal bowel sounds heard. Central nervous system: Alert and oriented. No focal neurological deficits. Extremities: no edema Skin: No rashes Psychiatry:  Mood & affect appropriate.  Discharge Instructions   Discharge Instructions     Diet - low sodium heart healthy   Complete by: As directed    Diet Carb Modified   Complete by: As directed    Discharge wound care:   Complete by: As directed    routine   Increase activity slowly   Complete by: As directed       Allergies as of 01/05/2024       Reactions   Pregabalin  Other Reaction(s): feeling fatigue drunk feeling, falls   Tape Rash   Pt can tolerate the paper tape        Medication List     STOP taking these medications    bumetanide  1 MG tablet Commonly known as: BUMEX    empagliflozin  10 MG Tabs tablet Commonly known as: Jardiance    Entresto  49-51 MG Generic drug: sacubitril -valsartan     spironolactone  25 MG tablet Commonly known as: ALDACTONE        TAKE these medications    atorvastatin  40 MG tablet Commonly known as: LIPITOR Take 1 tablet by mouth once daily   bisacodyl  5 MG EC tablet Commonly known as: DULCOLAX Take 1 tablet (5 mg total) by mouth daily as needed for moderate constipation.   carvedilol  3.125 MG tablet Commonly known as: COREG  Take 1 tablet (3.125 mg total) by mouth 2 (two) times daily with a meal.   FreeStyle Libre 3 Sensor Misc See admin instructions.   irbesartan 300 MG tablet Commonly known as: AVAPRO Take 1 tablet (300 mg total) by mouth daily.   Lantus  SoloStar 100 UNIT/ML Solostar Pen Generic drug: insulin  glargine Inject 20 Units into the skin in the morning.   PATADAY OP Place 1 drop into both eyes daily as needed (for itching).   senna 8.6 MG Tabs tablet Commonly known as: SENOKOT Take 1 tablet (8.6 mg total) by mouth daily.   torsemide 20 MG tablet Commonly known as: DEMADEX Take 1 tablet (20 mg total) by mouth daily.               Discharge Care Instructions  (From admission, onward)           Start     Ordered   01/04/24 0000  Discharge wound care:       Comments: routine   01/04/24 1141           Allergies  Allergen Reactions   Pregabalin     Other Reaction(s): feeling fatigue drunk feeling, falls   Tape Rash    Pt can tolerate the paper tape    Contact information for after-discharge care     Home Medical Care     [facility]) Follow up.   Service: Home Health Services Why: Home Health Contact information: [address] [place]  [address] [phone]                      The results of significant diagnostics from this hospitalization (including imaging, microbiology, ancillary and laboratory) are listed below for reference.    Significant Diagnostic Studies: DG CHEST PORT 1 VIEW Result Date: 01/04/2024 CLINICAL  DATA:  Shortness of breath.  Bradycardia. EXAM: PORTABLE CHEST 1 VIEW COMPARISON:  01/01/2024. FINDINGS: Stable cardiomediastinal contours. Stable dual lead left-sided pacemaker. Aortic atherosclerosis. Similar pulmonary vascular congestion. No focal consolidation, sizeable pleural effusion, or pneumothorax. No acute osseous abnormality. IMPRESSION: Mild cardiomegaly with pulmonary vascular congestion, similar to the prior exam. Electronically Signed   By: Harrietta Sherry M.D.   On: 01/04/2024 15:34   DG Chest 2 View Result Date: 01/01/2024 CLINICAL DATA:  Pacemaker placement. EXAM: CHEST - 2 VIEW COMPARISON:  12/29/2023 FINDINGS: The cardio pericardial silhouette is enlarged. Low volume film with vascular congestion and trace pleural effusions. Underlying component of interstitial edema not entirely excluded. Streaky density at the right base suggests atelectasis. Dual lead left-sided permanent pacemaker is new in the interval without evidence for pneumothorax. Telemetry leads overlie  the chest. IMPRESSION: 1. Interval placement of left-sided permanent pacemaker without evidence for pneumothorax. 2. Low volume film with vascular congestion and trace pleural effusions. Electronically Signed   By: Camellia Candle M.D.   On: 01/01/2024 07:32   EP PPM/ICD IMPLANT Result Date: 12/31/2023 Summary: 1. Successful implant of a LEFT sided MDT DC/LBaP PPM implant Recommendations: 1. Routine post-procedure care with bedrest for 2 hours 2. No heparin  (IV or subcutaneous) for 48 hours. No enoxaparin  (IV or subcutaneous) for 7 days. 3. PA/lateral CXR in AM  4. Wound check in AM 5. Device interrogation in AM Donnice DELENA Primus, MD [facility] Cardiac Electrophysiology     DG CHEST PORT 1 VIEW Result Date: 12/29/2023 EXAM: 1 VIEW(S) XRAY OF THE CHEST 12/29/2023 10:40:00 AM COMPARISON: 12/28/2023 CLINICAL HISTORY: Temporary transvenous cardiac pacemaker present. FINDINGS: LINES, TUBES AND DEVICES: Right IJ  transvenous pacer lead in place with tip projecting over right ventricle. Defibrillator pads noted. LUNGS AND PLEURA: Mild pulmonary edema. No pleural effusion. No pneumothorax. HEART AND MEDIASTINUM: Stable cardiomediastinal contours with mild cardiomegaly. Aortic atherosclerosis. BONES AND SOFT TISSUES: No acute osseous abnormality. IMPRESSION: 1. Mild pulmonary edema. 2. Stable cardiomediastinal contours with mild cardiomegaly, unchanged from 12/28/2023. 3. Right IJ transvenous pacer lead in place with tip projecting over the right ventricle; no pneumothorax. Electronically signed by: Rockey Kilts MD 12/29/2023 03:25 PM EST RP Workstation: [id number]   ECHOCARDIOGRAM COMPLETE Result Date: 12/29/2023    ECHOCARDIOGRAM REPORT   Patient Name:   [name] Date of Exam: 12/29/2023 Medical Rec #:  [id number]    Height:       63.5 in Accession #:    [id number]   Weight:       127.4 lb Date of Birth:  11/08/1945    BSA:          1.606 m Patient Age:    78 years     BP:           144/72 mmHg Patient Gender: F            HR:           71 bpm. Exam Location:  Inpatient Procedure: 3D Echo, 2D Echo, Color Doppler, Cardiac Doppler and Strain Analysis            (Both Spectral and Color Flow Doppler were utilized during            procedure). Indications:    Heart Block, 2nd Degree I44.1  History:        Patient has prior history of Echocardiogram examinations, most                 recent 06/25/2022. CHF and Noniscehmic Cardiomyaopathy, CAD; Risk                 Factors:Diabetes and Former Smoker. Breast Cancer.  Sonographer:    Logan Shove RDCS Referring Phys: [id number] BRIDGETTE CHRISTOPHER IMPRESSIONS  1. Left ventricular ejection fraction, by estimation, is 50 to 55%. The left ventricle has low normal function. The left ventricle demonstrates regional wall motion abnormalities (see scoring diagram/findings for description). There is mild left ventricular hypertrophy. Left ventricular diastolic parameters are consistent with  Grade III diastolic dysfunction (restrictive). Elevated left atrial pressure.  2. Right ventricular systolic function is normal. The right ventricular size is normal. There is mildly elevated pulmonary artery systolic pressure.  3. Left atrial size was moderately dilated.  4. The mitral valve is abnormal. Mild mitral valve regurgitation.  No evidence of mitral stenosis.  5. The tricuspid valve is abnormal. Tricuspid valve regurgitation is moderate.  6. The aortic valve is tricuspid. Aortic valve regurgitation is not visualized. No aortic stenosis is present.  7. The inferior vena cava is normal in size with greater than 50% respiratory variability, suggesting right atrial pressure of 3 mmHg.  8. Small PFO with mild left to right shunting. FINDINGS  Left Ventricle: Mild apical hypokinesis. Left ventricular ejection fraction, by estimation, is 50 to 55%. The left ventricle has low normal function. The left ventricle demonstrates regional wall motion abnormalities. Strain was performed and the global  longitudinal strain is indeterminate. The left ventricular internal cavity size was normal in size. There is mild left ventricular hypertrophy. Left ventricular diastolic parameters are consistent with Grade III diastolic dysfunction (restrictive). Elevated left atrial pressure. Right Ventricle: The right ventricular size is normal. Right vetricular wall thickness was not well visualized. Right ventricular systolic function is normal. There is mildly elevated pulmonary artery systolic pressure. The tricuspid regurgitant velocity  is 2.89 m/s, and with an assumed right atrial pressure of 3 mmHg, the estimated right ventricular systolic pressure is 36.4 mmHg. Left Atrium: Left atrial size was moderately dilated. Right Atrium: Right atrial size was normal in size. Pericardium: There is no evidence of pericardial effusion. Mitral Valve: The mitral valve is abnormal. Mild mitral valve regurgitation. No evidence of mitral valve  stenosis. MV peak gradient, 8.9 mmHg. The mean mitral valve gradient is 3.0 mmHg. Tricuspid Valve: The tricuspid valve is abnormal. Tricuspid valve regurgitation is moderate . No evidence of tricuspid stenosis. Aortic Valve: The aortic valve is tricuspid. Aortic valve regurgitation is not visualized. No aortic stenosis is present. Aortic valve mean gradient measures 5.6 mmHg. Aortic valve peak gradient measures 9.7 mmHg. Aortic valve area, by VTI measures 2.04 cm. Pulmonic Valve: The pulmonic valve was not well visualized. Pulmonic valve regurgitation is not visualized. No evidence of pulmonic stenosis. Aorta: The aortic root and ascending aorta are structurally normal, with no evidence of dilitation. Venous: The inferior vena cava is normal in size with greater than 50% respiratory variability, suggesting right atrial pressure of 3 mmHg. IAS/Shunts: Small PFO with mild left to right shunting. Additional Comments: 3D was performed not requiring image post processing on an independent workstation and was normal.  LEFT VENTRICLE PLAX 2D LVIDd:         4.00 cm   Diastology LVIDs:         2.90 cm   LV e' medial:    4.24 cm/s LV PW:         1.20 cm   LV E/e' medial:  19.5 LV IVS:        1.10 cm   LV e' lateral:   7.40 cm/s LVOT diam:     2.00 cm   LV E/e' lateral: 11.2 LV SV:         59 LV SV Index:   37 LVOT Area:     3.14 cm LV IVRT:       198 msec                          3D Volume EF:                          3D EF:        53 %  LV EDV:       126 ml                          LV ESV:       59 ml                          LV SV:        67 ml RIGHT VENTRICLE            IVC RV Basal diam:  4.20 cm    IVC diam: 1.80 cm RV Mid diam:    2.90 cm RV S prime:     7.18 cm/s  PULMONARY VEINS TAPSE (M-mode): 1.7 cm     Diastolic Velocity: 26.20 cm/s                            S/D Velocity:       2.30                            Systolic Velocity:  60.30 cm/s LEFT ATRIUM             Index        RIGHT ATRIUM            Index LA diam:        4.40 cm 2.74 cm/m   RA Area:     17.90 cm LA Vol (A2C):   92.3 ml 57.48 ml/m  RA Volume:   50.10 ml  31.20 ml/m LA Vol (A4C):   49.8 ml 31.01 ml/m LA Biplane Vol: 72.9 ml 45.39 ml/m  AORTIC VALVE AV Area (Vmax):    1.88 cm AV Area (Vmean):   1.86 cm AV Area (VTI):     2.04 cm AV Vmax:           156.05 cm/s AV Vmean:          110.759 cm/s AV VTI:            0.289 m AV Peak Grad:      9.7 mmHg AV Mean Grad:      5.6 mmHg LVOT Vmax:         93.56 cm/s LVOT Vmean:        65.593 cm/s LVOT VTI:          0.187 m LVOT/AV VTI ratio: 0.65  AORTA Ao Root diam: 2.80 cm Ao Asc diam:  2.60 cm MITRAL VALVE               TRICUSPID VALVE MV Area (PHT): 6.71 cm    TR Peak grad:   33.4 mmHg MV Peak grad:  8.9 mmHg    TR Vmax:        289.00 cm/s MV Mean grad:  3.0 mmHg MV Vmax:       1.49 m/s    SHUNTS MV Vmean:      78.0 cm/s   Systemic VTI:  0.19 m MV Decel Time: 113 msec    Systemic Diam: 2.00 cm MV E velocity: 82.80 cm/s MV A velocity: 45.10 cm/s MV E/A ratio:  1.84 Dorn Ross MD Electronically signed by Dorn Ross MD Signature Date/Time: 12/29/2023/1:02:08 PM    Final    CARDIAC CATHETERIZATION Result Date: 12/29/2023   Successful Percutaneous Temporary Pacemaker placement via right Internal Jugular Vein into the RV  apex => marked at 39 cm at the catheter cover, rate set at 70 bpm, 5 MA. Threshold 0.4 mm Defer timing of CRT-D placement to EP service. Alm Clay, MD  CT PELVIS WO CONTRAST Result Date: 12/29/2023 EXAM: CT PELVIS, WITHOUT IV CONTRAST 12/28/2023 09:08:00 PM TECHNIQUE: Axial images were acquired through the pelvis without IV contrast. Reformatted images were reviewed. Automated exposure control, iterative reconstruction, and/or weight based adjustment of the mA/kV was utilized to reduce the radiation dose to as low as reasonably achievable. COMPARISON: None available. CLINICAL HISTORY: bilateral hip pain post fall FINDINGS: BONES: A right inferior pubic ramus  fracture is noted without significant displacement. No other pelvic fractures are noted. No evidence of acute proximal femoral fracture. JOINTS: No dislocation. SOFT TISSUES: No surrounding soft tissue abnormality is seen. Vascular calcifications of the aorta are noted. INTRAPELVIC CONTENTS: Visualized bowel is within normal limits. The bladder is within normal limits. IMPRESSION: 1. Right inferior pubic ramus fracture without significant displacement. No other pelvic fractures. 2. No acute proximal femoral fracture. Electronically signed by: Oneil Devonshire MD 12/29/2023 12:51 AM EST RP Workstation: [id number]   CT LUMBAR SPINE WO CONTRAST Result Date: 12/28/2023 CLINICAL DATA:  Fall EXAM: CT LUMBAR SPINE WITHOUT CONTRAST TECHNIQUE: Multidetector CT imaging of the lumbar spine was performed without intravenous contrast administration. Multiplanar CT image reconstructions were also generated. RADIATION DOSE REDUCTION: This exam was performed according to the departmental dose-optimization program which includes automated exposure control, adjustment of the mA and/or kV according to patient size and/or use of iterative reconstruction technique. COMPARISON:  CT 02/27/2022 FINDINGS: Segmentation: 5 lumbar type vertebrae. Alignment: Normal. Vertebrae: Acute or subacute appearing fracture involving the superior endplate of L1 with shallow depression, estimated 10% loss of vertebral body height centrally. No significant retropulsion. Interval Schmorl's node and mild superior endplate deformity at L2. Probable acute fracture involving the superior endplate of L3 with estimated 20% loss of vertebral body height centrally. Minimal 2 mm retropulsion of the upper vertebral body, without significant canal stenosis. Fracture does not involve the pedicle or lamina. Paraspinal and other soft tissues: Suspicion of mild paravertebral edema at L3. Aortic atherosclerosis. Disc levels: At L1-L2, patent disc space. No canal stenosis or  high-grade foraminal narrowing. At L2-L3, patent disc space. No high-grade canal stenosis or foraminal narrowing. At L3-L4, patent disc space. Mild ligamentum flavum thickening and facet degenerative changes. No high-grade canal stenosis. Mild disc bulge. Patent foramen. At L4-L5, disc bulge. Ligamentum flavum thickening and moderate hypertrophic facet degenerative changes result in mild canal stenosis. No high-grade foraminal narrowing. At L5-S1, patent disc space. Diffuse disc bulge. Ligamentum flavum thickening and moderate facet degenerative changes. No high-grade canal stenosis or foraminal narrowing. IMPRESSION: 1. Acute or subacute appearing fracture involving the superior endplate of L1 with estimated 10% loss of vertebral body height centrally. No significant retropulsion. 2. Probable acute fracture involving the superior endplate of L3 with estimated 20% loss of vertebral body height centrally. Minimal 2 mm retropulsion of the upper vertebral body without significant canal stenosis. 3. Interval Schmorl's node and mild superior endplate deformity at L2. 4. Mild degenerative changes of the lumbar spine as described above. 5. Aortic atherosclerosis. Aortic Atherosclerosis (ICD10-I70.0). Electronically Signed   By: Luke Bun M.D.   On: 12/28/2023 21:45   CT CERVICAL SPINE WO CONTRAST Result Date: 12/28/2023 EXAM: CT Cervical Spine Without Contrast 12/28/2023 08:55:00 PM TECHNIQUE: CT of the cervical spine was performed without the administration of intravenous contrast. Multiplanar reformatted images are provided for review.  Automated exposure control, iterative reconstruction, and/or weight based adjustment of the mA/kV was utilized to reduce the radiation dose to as low as reasonably achievable. COMPARISON: MRI cervical spine 09/28/2020 CLINICAL HISTORY: Fall FINDINGS: BONES AND ALIGNMENT: No acute fracture or traumatic malalignment. Chronic degenerative anterolisthesis of C4 on C5. DEGENERATIVE  CHANGES: Facet and uncovertebral hypertrophy with severe foraminal stenosis at multiple levels, better characterized on 2022 MRI. Degenerative disc disease is greatest at C5-C6. SOFT TISSUES: No prevertebral soft tissue swelling. IMPRESSION: 1. No acute abnormality of the cervical spine. Electronically signed by: Gilmore Molt MD 12/28/2023 09:40 PM EST RP Workstation: [id number]   CT Head Wo Contrast Result Date: 12/28/2023 EXAM: CT HEAD WITHOUT CONTRAST 12/28/2023 07:35:00 PM TECHNIQUE: CT of the head was performed without the administration of intravenous contrast. Automated exposure control, iterative reconstruction, and/or weight based adjustment of the mA/kV was utilized to reduce the radiation dose to as low as reasonably achievable. COMPARISON: 10/09/2022 CLINICAL HISTORY: Head trauma, minor (Age >= 65y) FINDINGS: BRAIN AND VENTRICLES: No acute hemorrhage. No evidence of acute infarct. No mass effect or midline shift. Mild cerebral volume loss. Mild periventricular white matter disease. Bilateral basal ganglia calcifications. ORBITS: No acute abnormality. SINUSES: Mild mucosal disease within ethmoid air cells. SOFT TISSUES AND SKULL: No acute soft tissue abnormality. No skull fracture. VASCULATURE: Moderate calcific atheromatous disease within carotid siphons. IMPRESSION: 1. No acute intracranial abnormality related to head trauma. Electronically signed by: Oneil Devonshire MD 12/28/2023 08:05 PM EST RP Workstation: [id number]   DG Chest Port 1 View Result Date: 12/28/2023 EXAM: 1 VIEW(S) XRAY OF THE CHEST 12/28/2023 06:55:00 PM COMPARISON: 10/09/2022 CLINICAL HISTORY: dizziness FINDINGS: LINES, TUBES AND DEVICES: Multiple overlying monitor wires. LUNGS AND PLEURA: Pulmonary vascular congestion. Reticulonodular opacities in the left mid and lower lung suspicious for infection. No pulmonary edema. No pleural effusion. No pneumothorax. HEART AND MEDIASTINUM: Mild cardiomegaly. BONES AND SOFT TISSUES: No  acute osseous abnormality. IMPRESSION: 1. Left mid and lower lung reticulonodular opacities suspicious for infection. 2. Mild cardiomegaly with pulmonary vascular congestion. Electronically signed by: Norman Gatlin MD 12/28/2023 07:43 PM EST RP Workstation: [id number]    Microbiology: Recent Results (from the past 240 hours)  MRSA Next Gen by PCR, Nasal     Status: None   Collection Time: 12/29/23  4:44 AM   Specimen: Nasal Mucosa; Nasal Swab  Result Value Ref Range Status   MRSA by PCR Next Gen NOT DETECTED NOT DETECTED Final    Comment: (NOTE) The GeneXpert MRSA Assay (FDA approved for NASAL specimens only), is one component of a comprehensive MRSA colonization surveillance program. It is not intended to diagnose MRSA infection nor to guide or monitor treatment for MRSA infections. Test performance is not FDA approved in patients less than 42 years old. Performed at [facility], [address] [place], [place] [address]   Surgical PCR screen     Status: None   Collection Time: 12/30/23  9:28 PM   Specimen: Nasal Mucosa; Nasal Swab  Result Value Ref Range Status   MRSA, PCR NEGATIVE NEGATIVE Final   Staphylococcus aureus NEGATIVE NEGATIVE Final    Comment: (NOTE) The Xpert SA Assay (FDA approved for NASAL specimens in patients 81 years of age and older), is one component of a comprehensive surveillance program. It is not intended to diagnose infection nor to guide or monitor treatment. Performed at [facility], [address] [place], [place] [address]      Labs: Basic Metabolic Panel: Recent Labs  Lab 01/01/24 0215 01/02/24 0428 01/03/24 0413 01/04/24 0324 01/05/24 0414  NA 136 138 135 134* 138  K 4.7 5.2* 5.2* 5.1 4.6  CL 106 107 104 102 104  CO2 24 19* 23 26 23   GLUCOSE 189* 184* 152* 240* 102*  BUN 60* 44* 39* 39* 40*  CREATININE 1.98* 1.70* 1.48* 1.69* 1.52*  CALCIUM  8.8* 9.0 9.3 9.0 9.0  MG 2.2 2.2 2.1 1.9 2.2   Liver Function  Tests: Recent Labs  Lab 01/02/24 0428  AST 13*  ALT 31  ALKPHOS 74  BILITOT 1.0  PROT 5.7*  ALBUMIN 2.9*   No results for input(s): LIPASE, AMYLASE in the last 168 hours. No results for input(s): AMMONIA in the last 168 hours. CBC: Recent Labs  Lab 12/30/23 0212 12/31/23 0913 01/01/24 0215 01/02/24 0428 01/03/24 0413  WBC 5.8 6.0 5.5 5.1 5.8  HGB 8.8* 9.4* 9.1* 9.4* 10.0*  HCT 26.6* 28.0* 26.9* 27.9* 29.2*  MCV 94.3 93.0 92.4 93.3 91.3  PLT 149* 160 157 162 176   Cardiac Enzymes: No results for input(s): CKTOTAL, CKMB, CKMBINDEX, TROPONINI in the last 168 hours. BNP: BNP (last 3 results) Recent Labs    04/09/23 1429 12/28/23 1758  BNP 3,400.3* 2,489.8*    ProBNP (last 3 results) No results for input(s): PROBNP in the last 8760 hours.  CBG: Recent Labs  Lab 01/04/24 0744 01/04/24 1127 01/04/24 1423 01/04/24 1658 01/04/24 2204  GLUCAP 178* 181* 163* 145* 125*       Signed:  Sigurd Pac MD.  [facility] 01/05/2024, 7:46 AM

## 2024-01-05 ENCOUNTER — Other Ambulatory Visit: Payer: Self-pay

## 2024-01-05 ENCOUNTER — Inpatient Hospital Stay (HOSPITAL_COMMUNITY)
Admission: AD | Admit: 2024-01-05 | Discharge: 2024-01-15 | DRG: 560 | Disposition: A | Discharge status: Home Health | Source: Intra-hospital | Attending: Physical Medicine and Rehabilitation | Admitting: Physical Medicine and Rehabilitation

## 2024-01-05 ENCOUNTER — Encounter (HOSPITAL_COMMUNITY): Payer: Self-pay | Admitting: Physical Medicine and Rehabilitation

## 2024-01-05 DIAGNOSIS — L89616 Pressure-induced deep tissue damage of right heel: Secondary | ICD-10-CM | POA: Diagnosis present

## 2024-01-05 DIAGNOSIS — E875 Hyperkalemia: Secondary | ICD-10-CM | POA: Diagnosis present

## 2024-01-05 DIAGNOSIS — R197 Diarrhea, unspecified: Secondary | ICD-10-CM | POA: Diagnosis not present

## 2024-01-05 DIAGNOSIS — R5381 Other malaise: Secondary | ICD-10-CM | POA: Diagnosis not present

## 2024-01-05 DIAGNOSIS — I5022 Chronic systolic (congestive) heart failure: Secondary | ICD-10-CM | POA: Diagnosis present

## 2024-01-05 DIAGNOSIS — L89626 Pressure-induced deep tissue damage of left heel: Secondary | ICD-10-CM | POA: Diagnosis present

## 2024-01-05 DIAGNOSIS — S32591S Other specified fracture of right pubis, sequela: Secondary | ICD-10-CM

## 2024-01-05 DIAGNOSIS — I428 Other cardiomyopathies: Secondary | ICD-10-CM | POA: Diagnosis present

## 2024-01-05 DIAGNOSIS — H353222 Exudative age-related macular degeneration, left eye, with inactive choroidal neovascularization: Secondary | ICD-10-CM | POA: Diagnosis present

## 2024-01-05 DIAGNOSIS — Z7984 Long term (current) use of oral hypoglycemic drugs: Secondary | ICD-10-CM | POA: Diagnosis not present

## 2024-01-05 DIAGNOSIS — Z79899 Other long term (current) drug therapy: Secondary | ICD-10-CM

## 2024-01-05 DIAGNOSIS — I2510 Atherosclerotic heart disease of native coronary artery without angina pectoris: Secondary | ICD-10-CM | POA: Diagnosis present

## 2024-01-05 DIAGNOSIS — F54 Psychological and behavioral factors associated with disorders or diseases classified elsewhere: Secondary | ICD-10-CM

## 2024-01-05 DIAGNOSIS — H5462 Unqualified visual loss, left eye, normal vision right eye: Secondary | ICD-10-CM | POA: Diagnosis present

## 2024-01-05 DIAGNOSIS — K5901 Slow transit constipation: Secondary | ICD-10-CM | POA: Diagnosis not present

## 2024-01-05 DIAGNOSIS — Z833 Family history of diabetes mellitus: Secondary | ICD-10-CM

## 2024-01-05 DIAGNOSIS — I5023 Acute on chronic systolic (congestive) heart failure: Secondary | ICD-10-CM | POA: Diagnosis present

## 2024-01-05 DIAGNOSIS — W19XXXS Unspecified fall, sequela: Secondary | ICD-10-CM | POA: Diagnosis present

## 2024-01-05 DIAGNOSIS — H353210 Exudative age-related macular degeneration, right eye, stage unspecified: Secondary | ICD-10-CM | POA: Diagnosis present

## 2024-01-05 DIAGNOSIS — E1122 Type 2 diabetes mellitus with diabetic chronic kidney disease: Secondary | ICD-10-CM | POA: Diagnosis present

## 2024-01-05 DIAGNOSIS — J449 Chronic obstructive pulmonary disease, unspecified: Secondary | ICD-10-CM | POA: Diagnosis present

## 2024-01-05 DIAGNOSIS — N1832 Chronic kidney disease, stage 3b: Secondary | ICD-10-CM | POA: Diagnosis present

## 2024-01-05 DIAGNOSIS — I509 Heart failure, unspecified: Secondary | ICD-10-CM

## 2024-01-05 DIAGNOSIS — E113393 Type 2 diabetes mellitus with moderate nonproliferative diabetic retinopathy without macular edema, bilateral: Secondary | ICD-10-CM | POA: Diagnosis present

## 2024-01-05 DIAGNOSIS — H9190 Unspecified hearing loss, unspecified ear: Secondary | ICD-10-CM | POA: Diagnosis present

## 2024-01-05 DIAGNOSIS — M79605 Pain in left leg: Secondary | ICD-10-CM | POA: Diagnosis not present

## 2024-01-05 DIAGNOSIS — D509 Iron deficiency anemia, unspecified: Secondary | ICD-10-CM | POA: Diagnosis present

## 2024-01-05 DIAGNOSIS — Z4789 Encounter for other orthopedic aftercare: Principal | ICD-10-CM

## 2024-01-05 DIAGNOSIS — M79604 Pain in right leg: Secondary | ICD-10-CM | POA: Diagnosis present

## 2024-01-05 DIAGNOSIS — E785 Hyperlipidemia, unspecified: Secondary | ICD-10-CM | POA: Diagnosis present

## 2024-01-05 DIAGNOSIS — D631 Anemia in chronic kidney disease: Secondary | ICD-10-CM | POA: Diagnosis present

## 2024-01-05 DIAGNOSIS — I442 Atrioventricular block, complete: Secondary | ICD-10-CM | POA: Diagnosis present

## 2024-01-05 DIAGNOSIS — N179 Acute kidney failure, unspecified: Secondary | ICD-10-CM | POA: Diagnosis present

## 2024-01-05 DIAGNOSIS — I10 Essential (primary) hypertension: Secondary | ICD-10-CM | POA: Diagnosis present

## 2024-01-05 DIAGNOSIS — E7849 Other hyperlipidemia: Secondary | ICD-10-CM | POA: Diagnosis present

## 2024-01-05 DIAGNOSIS — E1169 Type 2 diabetes mellitus with other specified complication: Secondary | ICD-10-CM | POA: Diagnosis present

## 2024-01-05 DIAGNOSIS — I130 Hypertensive heart and chronic kidney disease with heart failure and stage 1 through stage 4 chronic kidney disease, or unspecified chronic kidney disease: Secondary | ICD-10-CM | POA: Diagnosis present

## 2024-01-05 DIAGNOSIS — Z794 Long term (current) use of insulin: Secondary | ICD-10-CM | POA: Diagnosis not present

## 2024-01-05 DIAGNOSIS — E1165 Type 2 diabetes mellitus with hyperglycemia: Secondary | ICD-10-CM | POA: Diagnosis present

## 2024-01-05 DIAGNOSIS — R739 Hyperglycemia, unspecified: Secondary | ICD-10-CM | POA: Diagnosis not present

## 2024-01-05 DIAGNOSIS — I441 Atrioventricular block, second degree: Secondary | ICD-10-CM | POA: Diagnosis present

## 2024-01-05 DIAGNOSIS — Z950 Presence of cardiac pacemaker: Secondary | ICD-10-CM

## 2024-01-05 DIAGNOSIS — R001 Bradycardia, unspecified: Secondary | ICD-10-CM | POA: Diagnosis present

## 2024-01-05 DIAGNOSIS — X58XXXS Exposure to other specified factors, sequela: Secondary | ICD-10-CM | POA: Diagnosis present

## 2024-01-05 DIAGNOSIS — K5900 Constipation, unspecified: Secondary | ICD-10-CM | POA: Diagnosis present

## 2024-01-05 DIAGNOSIS — R609 Edema, unspecified: Secondary | ICD-10-CM | POA: Diagnosis not present

## 2024-01-05 DIAGNOSIS — Z87891 Personal history of nicotine dependence: Secondary | ICD-10-CM

## 2024-01-05 LAB — BASIC METABOLIC PANEL WITH GFR
Anion gap: 11 (ref 5–15)
BUN: 40 mg/dL — ABNORMAL HIGH (ref 8–23)
CO2: 23 mmol/L (ref 22–32)
Calcium: 9 mg/dL (ref 8.9–10.3)
Chloride: 104 mmol/L (ref 98–111)
Creatinine, Ser: 1.52 mg/dL — ABNORMAL HIGH (ref 0.44–1.00)
GFR, Estimated: 34 mL/min — ABNORMAL LOW (ref >60)
Glucose, Bld: 102 mg/dL — ABNORMAL HIGH (ref 70–99)
Potassium: 4.6 mmol/L (ref 3.5–5.1)
Sodium: 138 mmol/L (ref 135–145)

## 2024-01-05 LAB — GLUCOSE, CAPILLARY
Glucose-Capillary: 119 mg/dL — ABNORMAL HIGH (ref 70–99)
Glucose-Capillary: 202 mg/dL — ABNORMAL HIGH (ref 70–99)
Glucose-Capillary: 211 mg/dL — ABNORMAL HIGH (ref 70–99)
Glucose-Capillary: 178 mg/dL — ABNORMAL HIGH (ref 70–99)

## 2024-01-05 LAB — CBC WITH DIFFERENTIAL/PLATELET
Abs Immature Granulocytes: 0.02 K/uL (ref 0.00–0.07)
Basophils Absolute: 0.1 K/uL (ref 0.0–0.1)
Basophils Relative: 1 %
Eosinophils Absolute: 0.2 K/uL (ref 0.0–0.5)
Eosinophils Relative: 5 %
HCT: 29.6 % — ABNORMAL LOW (ref 36.0–46.0)
Hemoglobin: 10.1 g/dL — ABNORMAL LOW (ref 12.0–15.0)
Immature Granulocytes: 0 %
Lymphocytes Relative: 24 %
Lymphs Abs: 1.1 K/uL (ref 0.7–4.0)
MCH: 31.6 pg (ref 26.0–34.0)
MCHC: 34.1 g/dL (ref 30.0–36.0)
MCV: 92.5 fL (ref 80.0–100.0)
Monocytes Absolute: 0.5 K/uL (ref 0.1–1.0)
Monocytes Relative: 11 %
Neutro Abs: 2.7 K/uL (ref 1.7–7.7)
Neutrophils Relative %: 59 %
Platelets: 216 K/uL (ref 150–400)
RBC: 3.2 MIL/uL — ABNORMAL LOW (ref 3.87–5.11)
RDW: 13.6 % (ref 11.5–15.5)
WBC: 4.6 K/uL (ref 4.0–10.5)
nRBC: 0 % (ref 0.0–0.2)

## 2024-01-05 LAB — COMPREHENSIVE METABOLIC PANEL WITH GFR
ALT: 19 U/L (ref 0–44)
AST: 17 U/L (ref 15–41)
Albumin: 2.7 g/dL — ABNORMAL LOW (ref 3.5–5.0)
Alkaline Phosphatase: 90 U/L (ref 38–126)
Anion gap: 9 (ref 5–15)
BUN: 40 mg/dL — ABNORMAL HIGH (ref 8–23)
CO2: 27 mmol/L (ref 22–32)
Calcium: 9 mg/dL (ref 8.9–10.3)
Chloride: 103 mmol/L (ref 98–111)
Creatinine, Ser: 1.75 mg/dL — ABNORMAL HIGH (ref 0.44–1.00)
GFR, Estimated: 29 mL/min — ABNORMAL LOW (ref >60)
Glucose, Bld: 189 mg/dL — ABNORMAL HIGH (ref 70–99)
Potassium: 5 mmol/L (ref 3.5–5.1)
Sodium: 139 mmol/L (ref 135–145)
Total Bilirubin: 0.7 mg/dL (ref 0.0–1.2)
Total Protein: 5.8 g/dL — ABNORMAL LOW (ref 6.5–8.1)

## 2024-01-05 LAB — MAGNESIUM: Magnesium: 2.2 mg/dL (ref 1.7–2.4)

## 2024-01-05 MED ORDER — TRAZODONE HCL 50 MG PO TABS
25.0000 mg | ORAL_TABLET | Freq: Every evening | ORAL | Status: DC | PRN
  Administered 2024-01-11: 50 mg via ORAL
  Filled 2024-01-05: qty 1

## 2024-01-05 MED ORDER — PROCHLORPERAZINE 25 MG RE SUPP
12.5000 mg | Freq: Four times a day (QID) | RECTAL | Status: DC | PRN
Start: 2024-01-05 — End: 2024-01-10

## 2024-01-05 MED ORDER — VITAMIN C 500 MG PO TABS
1000.0000 mg | ORAL_TABLET | Freq: Every day | ORAL | Status: DC
  Administered 2024-01-06 – 2024-01-08 (×3): 1000 mg via ORAL
  Filled 2024-01-05 (×3): qty 2

## 2024-01-05 MED ORDER — ORAL CARE MOUTH RINSE: 15.0000 mL | OROMUCOSAL | Status: DC | PRN

## 2024-01-05 MED ORDER — POLYETHYLENE GLYCOL 3350 17 G PO PACK
17.0000 g | PACK | Freq: Every day | ORAL | Status: DC
  Administered 2024-01-06 – 2024-01-08 (×3): 17 g via ORAL
  Filled 2024-01-05 (×3): qty 1

## 2024-01-05 MED ORDER — PROCHLORPERAZINE MALEATE 5 MG PO TABS: 5.0000 mg | ORAL_TABLET | Freq: Four times a day (QID) | ORAL | Status: DC | PRN

## 2024-01-05 MED ORDER — ALUM & MAG HYDROXIDE-SIMETH 200-200-20 MG/5ML PO SUSP
30.0000 mL | ORAL | Status: DC | PRN
  Administered 2024-01-13: 30 mL via ORAL
  Filled 2024-01-05: qty 30

## 2024-01-05 MED ORDER — TORSEMIDE 20 MG PO TABS
20.0000 mg | ORAL_TABLET | Freq: Every day | ORAL | Status: DC
  Administered 2024-01-06 – 2024-01-15 (×10): 20 mg via ORAL
  Filled 2024-01-05 (×10): qty 1

## 2024-01-05 MED ORDER — CARVEDILOL 3.125 MG PO TABS
3.1250 mg | ORAL_TABLET | Freq: Two times a day (BID) | ORAL | Status: DC
  Administered 2024-01-05 – 2024-01-15 (×20): 3.125 mg via ORAL
  Filled 2024-01-05 (×20): qty 1

## 2024-01-05 MED ORDER — INSULIN ASPART 100 UNIT/ML IJ SOLN
0.0000 [IU] | Freq: Three times a day (TID) | INTRAMUSCULAR | Status: DC
  Administered 2024-01-05: 5 [IU] via SUBCUTANEOUS
  Administered 2024-01-06 (×3): 2 [IU] via SUBCUTANEOUS
  Administered 2024-01-07: 5 [IU] via SUBCUTANEOUS
  Administered 2024-01-07 – 2024-01-08 (×2): 3 [IU] via SUBCUTANEOUS
  Administered 2024-01-10: 2 [IU] via SUBCUTANEOUS
  Filled 2024-01-05: qty 2
  Filled 2024-01-05 (×2): qty 5
  Filled 2024-01-05 (×2): qty 2
  Filled 2024-01-05: qty 3

## 2024-01-05 MED ORDER — FLEET ENEMA RE ENEM: 1.0000 | ENEMA | Freq: Once | RECTAL | Status: DC | PRN

## 2024-01-05 MED ORDER — PROCHLORPERAZINE EDISYLATE 10 MG/2ML IJ SOLN: 5.0000 mg | Freq: Four times a day (QID) | INTRAMUSCULAR | Status: DC | PRN

## 2024-01-05 MED ORDER — INSULIN GLARGINE-YFGN 100 UNIT/ML ~~LOC~~ SOLN
20.0000 [IU] | Freq: Every day | SUBCUTANEOUS | Status: DC
  Administered 2024-01-06 – 2024-01-07 (×2): 20 [IU] via SUBCUTANEOUS
  Filled 2024-01-05 (×2): qty 0.2

## 2024-01-05 MED ORDER — CHLORHEXIDINE GLUCONATE CLOTH 2 % EX PADS: 6.0000 | MEDICATED_PAD | Freq: Every day | CUTANEOUS | Status: DC

## 2024-01-05 MED ORDER — INSULIN ASPART 100 UNIT/ML IJ SOLN: 0.0000 [IU] | Freq: Every day | INTRAMUSCULAR | Status: DC

## 2024-01-05 MED ORDER — MELATONIN 5 MG PO TABS
5.0000 mg | ORAL_TABLET | Freq: Every day | ORAL | Status: DC
  Administered 2024-01-05 – 2024-01-07 (×3): 5 mg via ORAL
  Filled 2024-01-05 (×3): qty 1

## 2024-01-05 MED ORDER — ATORVASTATIN CALCIUM 40 MG PO TABS
40.0000 mg | ORAL_TABLET | Freq: Every day | ORAL | Status: DC
  Administered 2024-01-06 – 2024-01-15 (×10): 40 mg via ORAL
  Filled 2024-01-05 (×9): qty 1
  Filled 2024-01-05: qty 4

## 2024-01-05 MED ORDER — IRBESARTAN 300 MG PO TABS
300.0000 mg | ORAL_TABLET | Freq: Every day | ORAL | Status: DC
  Administered 2024-01-06 – 2024-01-15 (×10): 300 mg via ORAL
  Filled 2024-01-05 (×10): qty 1

## 2024-01-05 MED ORDER — DIPHENHYDRAMINE HCL 25 MG PO CAPS: 25.0000 mg | ORAL_CAPSULE | Freq: Four times a day (QID) | ORAL | Status: DC | PRN

## 2024-01-05 MED ORDER — BISACODYL 10 MG RE SUPP: 10.0000 mg | Freq: Every day | RECTAL | Status: DC | PRN

## 2024-01-05 MED ORDER — SENNA 8.6 MG PO TABS
1.0000 | ORAL_TABLET | Freq: Every day | ORAL | Status: DC
  Administered 2024-01-06 – 2024-01-12 (×7): 8.6 mg via ORAL
  Filled 2024-01-05 (×8): qty 1

## 2024-01-05 MED ORDER — ACETAMINOPHEN 325 MG PO TABS
325.0000 mg | ORAL_TABLET | ORAL | Status: DC | PRN
  Administered 2024-01-11 – 2024-01-12 (×2): 650 mg via ORAL
  Filled 2024-01-05 (×3): qty 2

## 2024-01-05 MED ORDER — SORBITOL 70 % SOLN
30.0000 mL | Freq: Once | Status: AC
  Administered 2024-01-05: 30 mL via ORAL
  Filled 2024-01-05: qty 30

## 2024-01-05 MED ORDER — GUAIFENESIN-DM 100-10 MG/5ML PO SYRP: 5.0000 mL | ORAL_SOLUTION | Freq: Four times a day (QID) | ORAL | Status: DC | PRN

## 2024-01-05 MED ORDER — LIDOCAINE 5 % EX PTCH: 1.0000 | MEDICATED_PATCH | Freq: Every day | CUTANEOUS | Status: DC | PRN

## 2024-01-05 MED ORDER — OXYCODONE HCL 5 MG PO TABS
5.0000 mg | ORAL_TABLET | Freq: Four times a day (QID) | ORAL | Status: DC | PRN
  Administered 2024-01-08 – 2024-01-10 (×3): 5 mg via ORAL
  Filled 2024-01-05 (×3): qty 1

## 2024-01-05 MED ORDER — OLOPATADINE HCL 0.1 % OP SOLN: 1.0000 [drp] | Freq: Two times a day (BID) | OPHTHALMIC | Status: DC | PRN

## 2024-01-05 MED ORDER — ZINC SULFATE 220 (50 ZN) MG PO CAPS
220.0000 mg | ORAL_CAPSULE | Freq: Every day | ORAL | Status: DC
  Administered 2024-01-05 – 2024-01-14 (×10): 220 mg via ORAL
  Filled 2024-01-05 (×10): qty 1

## 2024-01-05 MED ORDER — ENOXAPARIN SODIUM 30 MG/0.3ML IJ SOSY
30.0000 mg | PREFILLED_SYRINGE | INTRAMUSCULAR | Status: DC
  Administered 2024-01-05 – 2024-01-13 (×9): 30 mg via SUBCUTANEOUS
  Filled 2024-01-05 (×10): qty 0.3

## 2024-01-05 NOTE — H&P (Signed)
 Physical Medicine and Rehabilitation Admission H&P        Chief Complaint  Patient presents with   Functional deficits due to debility      HPI: [name] is a 78 year old R handed female with PMHx chronic systolic HF, CAD, CKD3B with baseline Cr  of ~1.7, HTN, HLD, type 2 DM, tobacco use, breast cancer s/p lumpectomy/radiation and chemotherapy. The patient presented at [facility] on 12/28/2023, via EMS after experiencing a witnessed syncopal episode by the patient's daughter. It was reported that the patient hit their head on the refrigerator. The patient complained of increased shortness of breath on exertion and right hip pain, along with the sudden onset of nausea, vomiting, and diarrhea. Upon EMS arrival, the patient's heart rate was noted to be in the 30s, atropine was administered en route to the ED. CT head was unremarkable. A CT of the pelvis revealed a right inferior pubic rami fracture without displacement. patient was afebrile and hemodynamically stable.  Labs without any significant electrolyte abnormalities.  AKI noted with creatinine 2.56 elevated from baseline 1.7, Elevated BNP 2500, Troponin 120-130, mildly elevated AST/ALT at 229 and 186. Vital signs significant for bradycardia heart rate in the 30s.  An EKG showed a 2:1 AV block, and cardiology was consulted. Carvedilol  was washed out due to bradycardia, and glucagon was initiated. The patient was admitted to the ICU, bradycardia continued and the patient underwent temporary pacer placement on 11/8 and permanent pacemaker placement on 11/10. Heparin  was started for DVT prophylaxis, and Lipitor was held due to abnormal liver function tests. The patient was followed for acute on chronic systolic CHF, and Entresto  and SGLT2 were held. The patient was started on irbesartan. Potassium levels were borderline high, and the patient received lokelma , with levels now stable. The patient appeared euvolemic and was started on  torsemide. For the right inferior femur ramus fracture, orthopedics recommended conservative management, weight-bearing as tolerated. The patient's A1c was 8.4, and Lantus  was resumed with an increased dose. Prior to arrival the patient was independent at baseline using RW intermittently.  On chart review she lives with her son in a 1 level home.  Currently mod to max assist with mobility and basic ADLs. Therapy evaluations completed due to patient decreased functional mobility was admitted for a comprehensive rehab program.      Pt reports constipation- would like something to get cleaned out- she reports only 1 small BM since admission- otherwise feels bloated and so full! Feels like intestines are asleep.    Denies any pain or nausea. Denies SOB since Bidil stopped yesterday when her BP dropped dramatically with Bidil being started (and stopped) yesterday.    Notes that has poor vision and poor hearing.    Review of Systems  Constitutional:  Positive for malaise/fatigue.  HENT:  Positive for hearing loss.   Eyes:  Positive for blurred vision.       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    Cardiovascular:  Positive for leg swelling. Negative for chest pain and palpitations.  Gastrointestinal:  Positive for constipation. Negative for abdominal pain and nausea.       LBM except 1 small BM that was Liquid was prior to admission >1 week ago  Genitourinary:  Positive for urgency.  Musculoskeletal:  Positive for joint pain and myalgias.  Skin: Negative.   Neurological:  Positive for weakness.  Negative for tingling and sensory change.  Endo/Heme/Allergies: Negative.   Psychiatric/Behavioral: Negative.    All other systems reviewed and are negative.              Past Medical History:  Diagnosis Date   Breast cancer (HCC)      breast - left    Chronic diastolic CHF (congestive heart failure), NYHA class 2 (HCC)      a.  cMRI 5/10: EF 38% // b. Echo 3/13/: EF 50-55, Gr 1 DD // c. Echo 8/14: EF 40, inf/inf-septal HK, Gr 1 DD, mild MR // d. Echo 5/17: EF 40-45, inf HK, Gr 1 DD, mild MR, mild LAE, reduced RVSF, mild TR, PASP 33 // e. Echo 10/17: EF 40-45, Gr 2 DD, mild MR, severe LAE; => 09/2020 EF 50-55%, GrII DD.   Coronary artery disease, non-occlusive      a. nonobs by LHC in 2010 // b. Myoview  10/17: Lg infarct apex, distal ant and lat walls, no ischemia, EF 38; int risk  // c. LHC 10/17:  dLAD 15, pLCx 30, mRCA 40, LVEDP 15   Diabetes mellitus     NICM (nonischemic cardiomyopathy) (HCC)               Past Surgical History:  Procedure Laterality Date   APPENDECTOMY       BREAST LUMPECTOMY        lt breast   CARDIAC CATHETERIZATION N/A 12/20/2015    Procedure: Left Heart Cath and Coronary Angiography;  Surgeon: Lonni Hanson, MD:: Mild to moderate, nonobstructive coronary artery disease, including 15% distal LAD stenosis, 30% ulcerated mid LCx disease, and 40% mid RCA stenosis.  Upper normal left ventricular filling pressure (LVEDP 15 mmHg).   PACEMAKER IMPLANT N/A 12/31/2023    Procedure: PACEMAKER IMPLANT;  Surgeon: Almetta Donnice LABOR, MD;  Location: [facility] INVASIVE CV LAB;  Service: Cardiovascular;  Laterality: N/A;   RIGHT HEART CATH N/A 05/08/2016    Procedure: Right Heart Cath;  Surgeon: Toribio JONELLE Fuel, MD;  Location: [facility] INVASIVE CV LAB;; RA = 3, RV = 34/5, PA = 38/12 (21), PCW = 12; Fick cardiac output/index = 4.6/2.6; PVR = 1.9 WU, Ao sat = 96%; PA sat = 58%, 62%    TEMPORARY PACEMAKER Right 12/29/2023    Procedure: TEMPORARY PACEMAKER;  Surgeon: Anner Alm ORN, MD;  Location: [facility] INVASIVE CV LAB;  Service: Cardiovascular;  Laterality: Right;   TRANSTHORACIC ECHOCARDIOGRAM   11/2015    a) 11/2015: EF 40 and 45%.  GRII DD; b) 04/2016: EF 20% with grade 2 diastolic dysfunction   TRANSTHORACIC ECHOCARDIOGRAM   10/2016    a) EF 55-60%.  Normal LV function.  Mild DD.[name]  Normal valve Fxn.; b) 09/2020: EF  50 to 55%.  Normal function.  No R WMA.  Mild LVH.  GR 2 DD with elevated LAP.-Mild LA dilation.  Moderately elevated PAP estimated RVSP 47 mmHg, and RAP 8 mmHg.   TRIGGER FINGER RELEASE Left 04/23/2017    Procedure: RELEASE TRIGGER FINGER TO LEFT FORTH FINGER;  Surgeon: Marcus Lung, MD;  Location: [facility];  Service: Plastics;  Laterality: Left;             Family History  Problem Relation Age of Onset   Stroke Father     Diabetes Maternal Grandmother          Social History:  reports that she quit smoking about 11 years ago. Her smoking use included cigarettes. She started smoking about  41 years ago. She has never used smokeless tobacco. She reports that she does not drink alcohol and does not use drugs. Allergies:  Allergies       Allergies  Allergen Reactions   Pregabalin        Other Reaction(s): feeling fatigue drunk feeling, falls   Tape Rash      Pt can tolerate the paper tape            Medications Prior to Admission  Medication Sig Dispense Refill   atorvastatin  (LIPITOR) 40 MG tablet Take 1 tablet by mouth once daily 30 tablet 0   bumetanide  (BUMEX ) 1 MG tablet Take 1 tablet by mouth once daily 90 tablet 3   carvedilol  (COREG ) 3.125 MG tablet Take 1 tablet (3.125 mg total) by mouth 2 (two) times daily with a meal. 180 tablet 1   Continuous Glucose Sensor (FREESTYLE LIBRE 3 SENSOR) MISC See admin instructions.       LANTUS  SOLOSTAR 100 UNIT/ML Solostar Pen Inject 20 Units into the skin in the morning.       Olopatadine HCl (PATADAY OP) Place 1 drop into both eyes daily as needed (for itching).       sacubitril -valsartan  (ENTRESTO ) 49-51 MG Take 1 tablet by mouth 2 (two) times daily. 180 tablet 3   spironolactone  (ALDACTONE ) 25 MG tablet Take 0.5 tablets (12.5 mg total) by mouth daily. 90 tablet 3   empagliflozin  (JARDIANCE ) 10 MG TABS tablet Take 1 tablet (10 mg total) by mouth daily before breakfast. (Patient not taking: Reported on 12/28/2023) 30  tablet 11              Home: Home Living Family/patient expects to be discharged to:: Inpatient rehab Living Arrangements: Children Available Help at Discharge: Family, Available PRN/intermittently Type of Home: House Home Access: Level entry Home Layout: Able to live on main level with bedroom/bathroom Bathroom Shower/Tub: Health Visitor: Standard Home Equipment: Rollator (4 wheels), Shower seat - built in, [employer] [facility] - tub/shower, Hand held shower head Additional Comments: pt's daughter reports that pt doesn't use RW much but when she does, she pushes down hard with her arms and leans very far forward   Functional History: Prior Function Prior Level of Function : Independent/Modified Independent Mobility Comments: did not use DME ADLs Comments: Ind with ADLs and IADLs, her son manages med set up   Functional Status:  Mobility: Bed Mobility Overal bed mobility: Needs Assistance Bed Mobility: Supine to Sit, Sit to Supine Supine to sit: Mod assist, HOB elevated, Used rails Sit to supine: Max assist General bed mobility comments: mod A with LEs and trunk elevation, max A with LE mgt back onto bed Transfers Overall transfer level: Needs assistance Equipment used: 1 person hand held assist Transfers: Sit to/from Stand Sit to Stand: Max assist, Mod assist General transfer comment: max A initial STS from EOB   ADL: ADL Overall ADL's : Needs assistance/impaired Eating/Feeding: Set up, Sitting Grooming: Wash/dry hands, Wash/dry face, Contact guard assist, Sitting Upper Body Bathing: Moderate assistance Lower Body Bathing: Maximal assistance Upper Body Dressing : Moderate assistance Lower Body Dressing: Maximal assistance Toilet Transfer: Moderate assistance, Stand-pivot, Cueing for safety, Cueing for sequencing Toileting- Clothing Manipulation and Hygiene: Maximal assistance, Total assistance, Sitting/lateral lean, Sit to/from stand Functional mobility  during ADLs: Maximal assistance, Moderate assistance, Cueing for safety, Cueing for sequencing General ADL Comments: may benefit trial with hemiwalker   Cognition: Cognition Orientation Level: Oriented X4 Cognition Arousal: Alert Behavior During Therapy: [facility]  for tasks assessed/performed   Physical Exam: Blood pressure 126/65, pulse 60, temperature 98.2 F (36.8 C), temperature source Oral, resp. rate 16, height 5' 3.5 (1.613 m), weight 57.8 kg, SpO2 98%. Physical Exam Vitals and nursing note reviewed.  Constitutional:      Appearance: Normal appearance. She is normal weight.     Comments: Awake, alert, appropriate, turning head to focus with R eye; sitting up in bed- ate <25% of lunch, NAD  HENT:     Head: Normocephalic and atraumatic.     Right Ear: External ear normal.     Left Ear: External ear normal.     Nose: Nose normal. No congestion.     Mouth/Throat:     Mouth: Mucous membranes are dry.     Pharynx: Oropharynx is clear. No oropharyngeal exudate.  Eyes:     General:        Right eye: No discharge.        Left eye: No discharge.     Comments: Blind in L eye Cannot see features or details with R eye No drainage, erythema seen  Cardiovascular:     Rate and Rhythm: Normal rate and regular rhythm.     Heart sounds: Normal heart sounds. No murmur heard.    No gallop.  Pulmonary:     Effort: Pulmonary effort is normal. No respiratory distress.     Breath sounds: Normal breath sounds. No wheezing, rhonchi or rales.  Abdominal:     General: There is distension.     Tenderness: There is no abdominal tenderness.     Comments: Mildly Distended/ bloated, NT; hypoactive BS; a little firm  Genitourinary:    Comments: Purewick in place with medium amber urine Musculoskeletal:     Cervical back: Neck supple.     Comments: Ue's 5-/5 except FA 4/5 on R and 4+/5 on L RLE- HF 2/5 limited due to pain; otherwise KE/KF 3+/5; and DF 4/5 and PF 4+/5 LLE- HF 3-/5; otherwise the same  in KE/KF/DF/PF  as RLE   TTP around R hip- no incision  Skin:    General: Skin is warm and dry.     Comments: R forearm IV- looks OK Skin dry- flaking on LE's  Neurological:     Mental Status: She is alert and oriented to person, place, and time.     Comments: Intact to light touch in LE's- except feet decreased- from her chronic neuropathy  Psychiatric:        Mood and Affect: Mood normal.        Behavior: Behavior normal.     Comments: A little flat- appears frustrated with bad food       Lab Results Last 48 Hours        Results for orders placed or performed during the hospital encounter of 12/28/23 (from the past 48 hours)  Glucose, capillary     Status: Abnormal    Collection Time: 01/02/24  4:05 PM  Result Value Ref Range    Glucose-Capillary 215 (H) 70 - 99 mg/dL      Comment: Glucose reference range applies only to samples taken after fasting for at least 8 hours.  Glucose, capillary     Status: Abnormal    Collection Time: 01/02/24  9:08 PM  Result Value Ref Range    Glucose-Capillary 202 (H) 70 - 99 mg/dL      Comment: Glucose reference range applies only to samples taken after fasting for at least 8 hours.  Basic metabolic panel     Status: Abnormal    Collection Time: 01/03/24  4:13 AM  Result Value Ref Range    Sodium 135 135 - 145 mmol/L    Potassium 5.2 (H) 3.5 - 5.1 mmol/L    Chloride 104 98 - 111 mmol/L    CO2 23 22 - 32 mmol/L    Glucose, Bld 152 (H) 70 - 99 mg/dL      Comment: Glucose reference range applies only to samples taken after fasting for at least 8 hours.    BUN 39 (H) 8 - 23 mg/dL    Creatinine, Ser 8.51 (H) 0.44 - 1.00 mg/dL    Calcium  9.3 8.9 - 10.3 mg/dL    GFR, Estimated 36 (L) >60 mL/min      Comment: (NOTE) Calculated using the CKD-EPI Creatinine Equation (2021)      Anion gap 8 5 - 15      Comment: Performed at [facility], [address], [place], [place] [address]  CBC     Status: Abnormal    Collection Time: 01/03/24   4:13 AM  Result Value Ref Range    WBC 5.8 4.0 - 10.5 K/uL    RBC 3.20 (L) 3.87 - 5.11 MIL/uL    Hemoglobin 10.0 (L) 12.0 - 15.0 g/dL    HCT 70.7 (L) 63.9 - 46.0 %    MCV 91.3 80.0 - 100.0 fL    MCH 31.3 26.0 - 34.0 pg    MCHC 34.2 30.0 - 36.0 g/dL    RDW 86.3 88.4 - 84.4 %    Platelets 176 150 - 400 K/uL    nRBC 0.0 0.0 - 0.2 %      Comment: Performed at [facility], [address] [place], [place] [address]  Magnesium      Status: None    Collection Time: 01/03/24  4:13 AM  Result Value Ref Range    Magnesium  2.1 1.7 - 2.4 mg/dL      Comment: Performed at [facility], [address], [place], [place] [address]  Glucose, capillary     Status: Abnormal    Collection Time: 01/03/24  7:47 AM  Result Value Ref Range    Glucose-Capillary 152 (H) 70 - 99 mg/dL      Comment: Glucose reference range applies only to samples taken after fasting for at least 8 hours.  Glucose, capillary     Status: Abnormal    Collection Time: 01/03/24 12:06 PM  Result Value Ref Range    Glucose-Capillary 259 (H) 70 - 99 mg/dL      Comment: Glucose reference range applies only to samples taken after fasting for at least 8 hours.  Glucose, capillary     Status: Abnormal    Collection Time: 01/03/24  3:43 PM  Result Value Ref Range    Glucose-Capillary 191 (H) 70 - 99 mg/dL      Comment: Glucose reference range applies only to samples taken after fasting for at least 8 hours.  Glucose, capillary     Status: Abnormal    Collection Time: 01/03/24  9:26 PM  Result Value Ref Range    Glucose-Capillary 166 (H) 70 - 99 mg/dL      Comment: Glucose reference range applies only to samples taken after fasting for at least 8 hours.  Basic metabolic panel     Status: Abnormal    Collection Time: 01/04/24  3:24 AM  Result Value Ref Range  Sodium 134 (L) 135 - 145 mmol/L    Potassium 5.1 3.5 - 5.1 mmol/L    Chloride 102 98 - 111 mmol/L    CO2 26 22 - 32 mmol/L    Glucose, Bld 240 (H) 70 - 99 mg/dL       Comment: Glucose reference range applies only to samples taken after fasting for at least 8 hours.    BUN 39 (H) 8 - 23 mg/dL    Creatinine, Ser 8.30 (H) 0.44 - 1.00 mg/dL    Calcium  9.0 8.9 - 10.3 mg/dL    GFR, Estimated 30 (L) >60 mL/min      Comment: (NOTE) Calculated using the CKD-EPI Creatinine Equation (2021)      Anion gap 6 5 - 15      Comment: Performed at [facility], [address], [place], [place] [address]  Magnesium      Status: None    Collection Time: 01/04/24  3:24 AM  Result Value Ref Range    Magnesium  1.9 1.7 - 2.4 mg/dL      Comment: Performed at [facility], [address] [place], [place] [address]  Glucose, capillary     Status: Abnormal    Collection Time: 01/04/24  7:44 AM  Result Value Ref Range    Glucose-Capillary 178 (H) 70 - 99 mg/dL      Comment: Glucose reference range applies only to samples taken after fasting for at least 8 hours.    Comment 1 QC Due    Glucose, capillary     Status: Abnormal    Collection Time: 01/04/24 11:27 AM  Result Value Ref Range    Glucose-Capillary 181 (H) 70 - 99 mg/dL      Comment: Glucose reference range applies only to samples taken after fasting for at least 8 hours.      Imaging Results (Last 48 hours)  No results found.         Blood pressure 126/65, pulse 60, temperature 98.2 F (36.8 C), temperature source Oral, resp. rate 16, height 5' 3.5 (1.613 m), weight 57.8 kg, SpO2 98%.   Medical Problem List and Plan: 1. Functional deficits secondary to Debility from  Acute on chronic CHF exacerbation after syncope with AV block/pacemaker placement             -patient may  shower- pacemaker placed 11/10             -ELOS/Goals: 14-16 days- mod I to supervision   2.  Antithrombotics: -DVT/anticoagulation:  Mechanical: Sequential compression devices, below knee Bilateral lower extremities Pharmaceutical: Lovenox  start in CIR             -antiplatelet therapy: N/A Venous Duplex pending.     3. Pain Management: Oxycodone , Tylenol  and lidocaine  patch prn.    4. Mood/Behavior/Sleep: LCSW to follow for evaluation and support when available.              -antipsychotic agents: n/a             -sleep: melatonin 5 mg    5. Neuropsych/cognition: This patient is capable of making decisions on her own behalf.   6. Skin/Wound Care: Routine pressure relief measures. Monitor surgical site for infection.    7. Fluids/Electrolytes/Nutrition: Monitor I&O and weight. Follow up labs CBC/CMP     8. Complete Heart Block/Syncope: PPM implanted on 11/10, Coreg  3.125 mg.    9. Acute/Chronic systolic CHF: hx of Nonischemic Cardiomyopathy-Holding Entresto  and SGLT2 due to  AKI on CKD3B. Continue Irbesartan and Lasix .   10. AKI on CKD IIIB: Lasix , monitor CMP. Cr 1.69 11/14   11. T2DM: uncontrolled, A1C 8.1- monitor glucose ac/hs with SSI, Lantus  resumed. Jardience to be resumed when mobility improves.    12. Right Inferior Pubic Rami Fx: conservative management, weightbearing as tolerated.    13. HLD: Lipitor 40 mg    14. Constipation: SennaK and Miralax.              -gave Sorbitol and SSE today after admission since has been >1 week since decent BM. 15. Hyperkalemia: Received Lokelma  x1, trend labs. K+ 5.0- will recheck labs after admission             Daphne LOISE Satterfield, NP 01/04/2024   I have personally performed a face to face diagnostic evaluation of this patient and formulated the key components of the plan.  Additionally, I have personally reviewed laboratory data, imaging studies, as well as relevant notes and concur with the physician assistant's documentation above.   The patient's status has not changed from the original H&P.  Any changes in documentation from the acute care chart have been noted above.

## 2024-01-05 NOTE — Progress Notes (Signed)
 Patient ID: [name], female   DOB: 03/10/1945, 78 y.o.   MRN: [id number]     Advanced Heart Failure Rounding Note  Cardiologist: Alm Clay, MD  Chief Complaint: Heart Failure Subjective:   11/11 Moved out of ICU. Irbesartan started.  11/12 Irbesartan increased. Given dose of lokelma . Diuresed with IV lasix .  11/13- Given lokelma  and started on coreg .    Had hypotension after BiDil administered yesterday.  Was discontinued.  BP 160/73 this morning.  Creatinine stable at 1.5, potassium 4.6.  Denies any chest pain or dyspnea  Objective:   Weight Range: 57.8 kg Body mass index is 22.22 kg/m.   Vital Signs:   Temp:  [98 F (36.7 C)-98.4 F (36.9 C)] 98.4 F (36.9 C) (11/15 0749) Pulse Rate:  [59-70] 61 (11/15 1100) Resp:  [0-18] 14 (11/15 1100) BP: (96-160)/(57-74) 160/73 (11/15 0749) SpO2:  [97 %-100 %] 97 % (11/15 1100) Last BM Date : 01/03/24  Weight change: Filed Weights   12/28/23 1750 12/29/23 0417  Weight: 56.7 kg 57.8 kg    Intake/Output:   Intake/Output Summary (Last 24 hours) at 01/05/2024 1207 Last data filed at 01/05/2024 0900 Gross per 24 hour  Intake 477 ml  Output 450 ml  Net 27 ml     Physical Exam  General:   No resp difficulty Neck: no JVD.  Cor: Regular rate & rhythm. L upper chest scar.   Lungs: clear Abdomen: soft, nontender, nondistended.  Extremities: no  edema Neuro: alert & oriented x3   Telemetry   V paced 60-70s   Labs    CBC Recent Labs    01/03/24 0413  WBC 5.8  HGB 10.0*  HCT 29.2*  MCV 91.3  PLT 176   Basic Metabolic Panel Recent Labs    88/85/74 0324 01/05/24 0414  NA 134* 138  K 5.1 4.6  CL 102 104  CO2 26 23  GLUCOSE 240* 102*  BUN 39* 40*  CREATININE 1.69* 1.52*  CALCIUM  9.0 9.0  MG 1.9 2.2   Liver Function Tests No results for input(s): AST, ALT, ALKPHOS, BILITOT, PROT, ALBUMIN in the last 72 hours.      Medications:     Scheduled Medications:  atorvastatin   40 mg Oral  Daily   carvedilol   3.125 mg Oral BID WC   Chlorhexidine  Gluconate Cloth  6 each Topical Daily   diphenhydrAMINE  25 mg Intravenous Once   EPINEPHrine   0.3 mg Intramuscular Once   insulin  aspart  0-15 Units Subcutaneous TID WC   insulin  aspart  0-5 Units Subcutaneous QHS   insulin  glargine-yfgn  20 Units Subcutaneous Daily   irbesartan  300 mg Oral Daily   melatonin  5 mg Oral QHS   polyethylene glycol  17 g Oral Daily   senna  1 tablet Oral Daily   sodium chloride  flush  10-40 mL Intracatheter Q12H   torsemide  20 mg Oral Daily    Infusions:  sodium chloride        PRN Medications: acetaminophen , bisacodyl , lidocaine , olopatadine, ondansetron  **OR** ondansetron  (ZOFRAN ) IV, mouth rinse, oxyCODONE , sodium chloride  flush   Assessment/Plan   Complete heart block: With syncope prior to admission.  Suspect due to conduction system degeneration.  She was on Coreg  3.125 mg bid at home, minimal beta blockade.  Troponin mildly elevated with no trend, doubt ACS.  She had prior cath with nonobstructive CAD.  TVP placed 11/8 - S/P CRT-P 11/10  Acute on chronic systolic CHF: H/o nonischemic CMP, last echo  in 8/24 with EF 30-35%, moderate RV dysfunction.  Presented with volume overload and AKI in setting of CHB.   - Holding entresto , coreg , MRA, and SGLT2 currently. Will not be able to restart Entresto  as patient assistance was stopped. K stable.  -Volume status stable. Continue torsemide 20 mg daily -- Continue coreg  3.125 mg twice a day.  - Continue irbesartan 300 mg mg daily - BiDil added yesterday but became hypotensive, discontinued - Spiro when renal function improves - SGLT2i when more mobile   AKI on CKD stage 3: AKI in setting of CHB and syncope/hypotension.  Creatinine peaked at 2.9--> today 1.5  R inferior pubic ramus fx - Post syncopal event on admission  Planning CIR   Lonni LITTIE Nanas

## 2024-01-05 NOTE — Progress Notes (Signed)
 Patient seen and examined, stable and improved after hypotensive episode following administration of Bidil yesterday afternoon.  Bidil discontinued. Medically stable for discharge to CIR for rehabilitation, please see my discharge summary for details  Sigurd Pac,. MD

## 2024-01-05 NOTE — Plan of Care (Signed)
  Problem: Education: Goal: Ability to describe self-care measures that may prevent or decrease complications (Diabetes Survival Skills Education) will improve Outcome: Progressing Goal: Individualized Educational Video(s) Outcome: Progressing   Problem: Coping: Goal: Ability to adjust to condition or change in health will improve Outcome: Progressing   Problem: Fluid Volume: Goal: Ability to maintain a balanced intake and output will improve Outcome: Progressing   Problem: Health Behavior/Discharge Planning: Goal: Ability to identify and utilize available resources and services will improve Outcome: Progressing Goal: Ability to manage health-related needs will improve Outcome: Progressing   Problem: Metabolic: Goal: Ability to maintain appropriate glucose levels will improve Outcome: Progressing   Problem: Nutritional: Goal: Maintenance of adequate nutrition will improve Outcome: Progressing Goal: Progress toward achieving an optimal weight will improve Outcome: Progressing   Problem: Skin Integrity: Goal: Risk for impaired skin integrity will decrease Outcome: Progressing   Problem: Tissue Perfusion: Goal: Adequacy of tissue perfusion will improve Outcome: Progressing   Problem: Education: Goal: Knowledge of General Education information will improve Description: Including pain rating scale, medication(s)/side effects and non-pharmacologic comfort measures Outcome: Progressing   Problem: Health Behavior/Discharge Planning: Goal: Ability to manage health-related needs will improve Outcome: Progressing   Problem: Clinical Measurements: Goal: Ability to maintain clinical measurements within normal limits will improve Outcome: Progressing Goal: Will remain free from infection Outcome: Progressing Goal: Diagnostic test results will improve Outcome: Progressing Goal: Respiratory complications will improve Outcome: Progressing Goal: Cardiovascular complication will  be avoided Outcome: Progressing   Problem: Activity: Goal: Risk for activity intolerance will decrease Outcome: Progressing   Problem: Nutrition: Goal: Adequate nutrition will be maintained Outcome: Progressing   Problem: Coping: Goal: Level of anxiety will decrease Outcome: Progressing   Problem: Elimination: Goal: Will not experience complications related to bowel motility Outcome: Progressing Goal: Will not experience complications related to urinary retention Outcome: Progressing   Problem: Pain Managment: Goal: General experience of comfort will improve and/or be controlled Outcome: Progressing   Problem: Safety: Goal: Ability to remain free from injury will improve Outcome: Progressing   Problem: Skin Integrity: Goal: Risk for impaired skin integrity will decrease Outcome: Progressing   Problem: Education: Goal: Knowledge of cardiac device and self-care will improve Outcome: Progressing Goal: Ability to safely manage health related needs after discharge will improve Outcome: Progressing Goal: Individualized Educational Video(s) Outcome: Progressing   Problem: Cardiac: Goal: Ability to achieve and maintain adequate cardiopulmonary perfusion will improve Outcome: Progressing

## 2024-01-05 NOTE — Progress Notes (Signed)
 Inpatient Rehabilitation Admission Medication Review by a Pharmacist  A complete drug regimen review was completed for this patient to identify any potential clinically significant medication issues.  High Risk Drug Classes Is patient taking? Indication by Medication  Antipsychotic Yes, as an intravenous medication Compazine - N/V  Anticoagulant Yes Enoxaparin  - DVT prophylaxis  Antibiotic No   Opioid Yes Oxycodone  - pain  Antiplatelet No   Hypoglycemics/insulin  Yes Semglee , novolog  - DM II  Vasoactive Medication Yes Irbersartan, carvedilol  - HTN  Chemotherapy No   Other Yes fleet enema , PEG , bisacodyl  , senna , and - constipation Maalox- indigestion Diphenhydramine- itching  Acetaminophen - pain  Robitussin- cough   melatonin, trazodone, and -insomnia Vitamin C, Zinc - supplement Torsemide - fluid retention Patanol - macular degeneration Lidocaine  - pain CHG - central line maintenace Atorvastatin  - HLD     Type of Medication Issue Identified Description of Issue Recommendation(s)  Drug Interaction(s) (clinically significant)     Duplicate Therapy     Allergy     No Medication Administration End Date     Incorrect Dose     Additional Drug Therapy Needed     Significant med changes from prior encounter (inform family/care partners about these prior to discharge). Prior to admission Entresto  changed to Irbersartan. Communicate relevant medication changes to patient/family members at discharge from CIR.   Restart or discontinue PTA meds not resumed in CIR at discharge if clinically indicated.   Other       Clinically significant medication issues were identified that warrant physician communication and completion of prescribed/recommended actions by midnight of the next day:  No  Name of provider notified for urgent issues identified:   Provider Method of Notification:     Pharmacist comments:   Time spent performing this drug regimen review (minutes):   15    Larraine Brazier, PharmD Clinical Pharmacist 01/05/2024  4:00 PM **Pharmacist phone directory can now be found on [email or web address] ([email or web address]).  Listed under [facility].

## 2024-01-06 DIAGNOSIS — R739 Hyperglycemia, unspecified: Secondary | ICD-10-CM | POA: Diagnosis not present

## 2024-01-06 DIAGNOSIS — R5381 Other malaise: Secondary | ICD-10-CM | POA: Diagnosis not present

## 2024-01-06 DIAGNOSIS — K5901 Slow transit constipation: Secondary | ICD-10-CM | POA: Diagnosis not present

## 2024-01-06 LAB — GLUCOSE, CAPILLARY
Glucose-Capillary: 121 mg/dL — ABNORMAL HIGH (ref 70–99)
Glucose-Capillary: 149 mg/dL — ABNORMAL HIGH (ref 70–99)
Glucose-Capillary: 148 mg/dL — ABNORMAL HIGH (ref 70–99)
Glucose-Capillary: 164 mg/dL — ABNORMAL HIGH (ref 70–99)

## 2024-01-06 NOTE — Progress Notes (Signed)
+/-   sleep. At 2000, large incontinent void. I thought I still had the purewick. Patient unaware she was wet. Bladder scan=130cc's. Continent of bladder rest of night with Assistance using female urinal. LBM 11/10, PRN sorbitol given on previous shift, along with miralax &senna. Patient had moderate, Mushy, incontinent stool. Small abrasion to upper back, foam dressing in place. Foam dressings to bilateral heels, boggy with DTI's.Prevalon boot ordered. Turned after every void. Foam dressing to sacrum for protection. Dandria Griego A

## 2024-01-06 NOTE — Consult Note (Signed)
 WOC Nurse Consult Note: Reason for Consult: bilateral heel DTPIs Wound type: darkened skin; right heel may be DTPI Pressure Injury POA: Yes Measurement: see nursing flow sheets Wound bed: darkened on the right  heel Drainage (amount, consistency, odor) none Periwound: intact Dressing procedure/placement/frequency: Prevalon boots bilaterally   Re consult if needed, will not follow at this time. Thanks  Steve [employer], RN,CWOCN, CNS, [employer] [phone]

## 2024-01-06 NOTE — Progress Notes (Signed)
 Awaiting vascular US .   Geni Armor, LPN

## 2024-01-06 NOTE — Progress Notes (Signed)
 Per OT, during the therapy session, after the patient was toileted and returned to bed, she exhibited brief mumbled speech lasting a few seconds, which then resolved spontaneously. The patient reported feeling very tired at the time. She also stated she was experiencing double vision, noting she was seeing two images of the OT.  The patient is now repositioned in bed, eating lunch, and reports feeling improved, stating she was likely just fatigued from today's activities.  The on-call provider, Dr. Cornelio, was notified. Will continue to monitor the patient closely and notify the MD of any changes in status.   Geni Armor, LPN

## 2024-01-06 NOTE — Progress Notes (Signed)
 Occupational Therapy Session Note  Patient Details  Name: [name] MRN: [id number] Date of Birth: 08/04/1945  Today's Date: 01/06/2024 OT Individual Time: 1300-1335 OT Individual Time Calculation (min): 35 min    Short Term Goals: Week 1:  OT Short Term Goal 1 (Week 1): patient will complete LB dressing min A with LRAD OT Short Term Goal 2 (Week 1): patient will complete functional toilet transfer with min A OT Short Term Goal 3 (Week 1): patient will tolerate standing 45 seconds with o2 within precaution range and good balance to increase ability to complete standing ADL tasks  Skilled Therapeutic Interventions/Progress Updates:   Patient agreeable to participate in OT session. Reports 6/10 pain level with functional mobility, premedicated . Patient received in bed with increased fatigue following sessions earlier. Patient sat on EOB with min A, patient sit to stand min A, functional mobility to bathroom mod A, toileting mod A to max A for 3/3 secondary to fatigue. Patient reported some dizziness. Returned to bed at [name] for eating mea. Patient had episode of mumbled speech, with double vision, and lack of coordination and sitting balance. OT took vitals charted by RN. RN informed and reached out to MD. MD ordered ted hose. Patient returned to supine with HOB elevated for meal session ended due to functional status and fatigue,   Therapy Documentation Precautions:  Precautions Precautions: ICD/Pacemaker Recall of Precautions/Restrictions: Intact Precaution/Restrictions Comments: pacemaker Restrictions Weight Bearing Restrictions Per Provider Order: Yes LUE Weight Bearing Per Provider Order: Non weight bearing RLE Weight Bearing Per Provider Order: Weight bearing as tolerated Other Position/Activity Restrictions: LUE limited wt bearing and ROM post PPM   Therapy/Group: Individual Therapy  [name] 01/06/2024, 2:08 PM

## 2024-01-06 NOTE — Plan of Care (Signed)
  Problem: RH BLADDER ELIMINATION Goal: RH STG MANAGE BLADDER WITH EQUIPMENT WITH ASSISTANCE Description: STG Manage Bladder With Equipment With Assistance Outcome: Not Applicable   Problem: RH BOWEL ELIMINATION Goal: RH STG MANAGE BOWEL WITH ASSISTANCE Description: STG Manage Bowel with Assistance. Flowsheets (Taken 01/06/2024 0335) STG: Pt will manage bowels with assistance: 4-Minimum assistance Goal: RH STG MANAGE BOWEL W/MEDICATION W/ASSISTANCE Description: STG Manage Bowel with Medication with Assistance. Flowsheets (Taken 01/06/2024 0336) STG: Pt will manage bowels with medication with assistance: 5-Supervision/cueing   Problem: RH BLADDER ELIMINATION Goal: RH STG MANAGE BLADDER WITH ASSISTANCE Description: STG Manage Bladder With Assistance Flowsheets (Taken 01/06/2024 0336) STG: Pt will manage bladder with assistance: 4-Minimal assistance Goal: RH STG MANAGE BLADDER WITH MEDICATION WITH ASSISTANCE Description: STG Manage Bladder With Medication With Assistance. Flowsheets (Taken 01/06/2024 0336) STG: Pt will manage bladder with medication with assistance: 6-Modified independent   Problem: RH SKIN INTEGRITY Goal: RH STG MAINTAIN SKIN INTEGRITY WITH ASSISTANCE Description: STG Maintain Skin Integrity With Assistance. Flowsheets (Taken 01/06/2024 0336) STG: Maintain skin integrity with assistance: 5-Supervision/cueing Goal: RH STG ABLE TO PERFORM INCISION/WOUND CARE W/ASSISTANCE Description: STG Able To Perform Incision/Wound Care With Assistance. Flowsheets (Taken 01/06/2024 0336) STG: Pt will be able to perform incision/wound care with assistance: 4-Minimal assistance   Problem: RH SAFETY Goal: RH STG ADHERE TO SAFETY PRECAUTIONS W/ASSISTANCE/DEVICE Description: STG Adhere to Safety Precautions With Assistance/Device. Flowsheets (Taken 01/06/2024 0336) STG:Pt will adhere to safety precautions with assistance/device: 5-Supervision/cueing   Problem: RH PAIN  MANAGEMENT Goal: RH STG PAIN MANAGED AT OR BELOW PT'S PAIN GOAL Note: Pain < 2 on pain scale

## 2024-01-06 NOTE — Progress Notes (Deleted)
 Patient awaiting next scd pump

## 2024-01-06 NOTE — Plan of Care (Signed)
  Problem: Consults Goal: RH GENERAL PATIENT EDUCATION Description: See Patient Education module for education specifics. 01/06/2024 1125 by Kit Geni LABOR, LPN Outcome: Progressing 01/06/2024 0907 by Kit Geni LABOR, LPN Outcome: Progressing Goal: Skin Care Protocol Initiated - if Braden Score 18 or less Description: If consults are not indicated, leave blank or document N/A Outcome: Progressing Goal: Diabetes Guidelines if Diabetic/Glucose > 140 Description: If diabetic or lab glucose is > 140 mg/dl - Initiate Diabetes/Hyperglycemia Guidelines & Document Interventions  Outcome: Progressing   Problem: RH BOWEL ELIMINATION Goal: RH STG MANAGE BOWEL WITH ASSISTANCE Description: STG Manage Bowel with Assistance. 01/06/2024 1125 by Kit Geni LABOR, LPN Outcome: Progressing 01/06/2024 0907 by Kit Geni LABOR, LPN Outcome: Progressing Goal: RH STG MANAGE BOWEL W/MEDICATION W/ASSISTANCE Description: STG Manage Bowel with Medication with Assistance. 01/06/2024 1125 by Kit Geni LABOR, LPN Outcome: Progressing 01/06/2024 0907 by Kit Geni LABOR, LPN Outcome: Progressing   Problem: RH BLADDER ELIMINATION Goal: RH STG MANAGE BLADDER WITH ASSISTANCE Description: STG Manage Bladder With Assistance 01/06/2024 1125 by Kit Geni LABOR, LPN Outcome: Progressing 01/06/2024 0907 by Kit Geni LABOR, LPN Outcome: Progressing Goal: RH STG MANAGE BLADDER WITH MEDICATION WITH ASSISTANCE Description: STG Manage Bladder With Medication With Assistance. 01/06/2024 1125 by Kit Geni LABOR, LPN Outcome: Progressing 01/06/2024 0907 by Kit Geni LABOR, LPN Outcome: Progressing   Problem: RH SKIN INTEGRITY Goal: RH STG SKIN FREE OF INFECTION/BREAKDOWN Outcome: Progressing Goal: RH STG MAINTAIN SKIN INTEGRITY WITH ASSISTANCE Description: STG Maintain Skin Integrity With Assistance. Outcome: Progressing Goal: RH STG ABLE TO PERFORM INCISION/WOUND CARE W/ASSISTANCE Description: STG Able To Perform  Incision/Wound Care With Assistance. Outcome: Progressing   Problem: RH SAFETY Goal: RH STG ADHERE TO SAFETY PRECAUTIONS W/ASSISTANCE/DEVICE Description: STG Adhere to Safety Precautions With Assistance/Device. Outcome: Progressing   Problem: RH PAIN MANAGEMENT Goal: RH STG PAIN MANAGED AT OR BELOW PT'S PAIN GOAL Outcome: Progressing   Problem: RH KNOWLEDGE DEFICIT GENERAL Goal: RH STG INCREASE KNOWLEDGE OF SELF CARE AFTER HOSPITALIZATION Outcome: Progressing

## 2024-01-06 NOTE — Evaluation (Addendum)
 Occupational Therapy Assessment and Plan  Patient Details  Name: [name] MRN: [id number] Date of Birth: June 17, 1945  OT Diagnosis: acute pain and muscle weakness (generalized) Rehab Potential: Rehab Potential (ACUTE ONLY): Good ELOS: 12-16   Today's Date: 01/06/2024 OT Individual Time: 9269-9154 OT Individual Time Calculation (min): 75 min     Hospital Problem: Principal Problem:   Debility Active Problems:   CHF exacerbation (HCC)   Past Medical History:  Past Medical History:  Diagnosis Date   Breast cancer (HCC)    breast - left    Chronic diastolic CHF (congestive heart failure), NYHA class 2 (HCC)    a. cMRI 5/10: EF 38% // b. Echo 3/13/: EF 50-55, Gr 1 DD // c. Echo 8/14: EF 40, inf/inf-septal HK, Gr 1 DD, mild MR // d. Echo 5/17: EF 40-45, inf HK, Gr 1 DD, mild MR, mild LAE, reduced RVSF, mild TR, PASP 33 // e. Echo 10/17: EF 40-45, Gr 2 DD, mild MR, severe LAE; => 09/2020 EF 50-55%, GrII DD.   Coronary artery disease, non-occlusive    a. nonobs by LHC in 2010 // b. Myoview  10/17: Lg infarct apex, distal ant and lat walls, no ischemia, EF 38; int risk  // c. LHC 10/17:  dLAD 15, pLCx 30, mRCA 40, LVEDP 15   Diabetes mellitus    NICM (nonischemic cardiomyopathy) (HCC)    Past Surgical History:  Past Surgical History:  Procedure Laterality Date   APPENDECTOMY     BREAST LUMPECTOMY     lt breast   CARDIAC CATHETERIZATION N/A 12/20/2015   Procedure: Left Heart Cath and Coronary Angiography;  Surgeon: Lonni Hanson, MD:: Mild to moderate, nonobstructive coronary artery disease, including 15% distal LAD stenosis, 30% ulcerated mid LCx disease, and 40% mid RCA stenosis.  Upper normal left ventricular filling pressure (LVEDP 15 mmHg).   PACEMAKER IMPLANT N/A 12/31/2023   Procedure: PACEMAKER IMPLANT;  Surgeon: Almetta Donnice LABOR, MD;  Location: [facility] INVASIVE CV LAB;  Service: Cardiovascular;  Laterality: N/A;   RIGHT HEART CATH N/A 05/08/2016   Procedure: Right Heart  Cath;  Surgeon: Toribio JONELLE Fuel, MD;  Location: [facility] INVASIVE CV LAB;; RA = 3, RV = 34/5, PA = 38/12 (21), PCW = 12; Fick cardiac output/index = 4.6/2.6; PVR = 1.9 WU, Ao sat = 96%; PA sat = 58%, 62%    TEMPORARY PACEMAKER Right 12/29/2023   Procedure: TEMPORARY PACEMAKER;  Surgeon: Anner Alm ORN, MD;  Location: [facility] INVASIVE CV LAB;  Service: Cardiovascular;  Laterality: Right;   TRANSTHORACIC ECHOCARDIOGRAM  11/2015   a) 11/2015: EF 40 and 45%.  GRII DD; b) 04/2016: EF 20% with grade 2 diastolic dysfunction   TRANSTHORACIC ECHOCARDIOGRAM  10/2016   a) EF 55-60%.  Normal LV function.  Mild DD.[name]  Normal valve Fxn.; b) 09/2020: EF 50 to 55%.  Normal function.  No R WMA.  Mild LVH.  GR 2 DD with elevated LAP.-Mild LA dilation.  Moderately elevated PAP estimated RVSP 47 mmHg, and RAP 8 mmHg.   TRIGGER FINGER RELEASE Left 04/23/2017   Procedure: RELEASE TRIGGER FINGER TO LEFT FORTH FINGER;  Surgeon: Marcus Lung, MD;  Location: [facility];  Service: Plastics;  Laterality: Left;    Assessment & Plan Clinical Impression: Patient is a 78 y.o. R handed female with PMHx chronic systolic HF, CAD, CKD3B with baseline Cr of ~1.7, HTN, HLD, type 2 DM, tobacco use, breast cancer s/p lumpectomy/radiation and chemotherapy. The patient presented at [facility] on  12/28/2023, via EMS after experiencing a witnessed syncopal episode by the patient's daughter. It was reported that the patient hit their head on the refrigerator. The patient complained of increased shortness of breath on exertion and right hip pain, along with the sudden onset of nausea, vomiting, and diarrhea. Upon EMS arrival, the patient's heart rate was noted to be in the 30s, atropine was administered en route to the ED. CT head was unremarkable. A CT of the pelvis revealed a right inferior pubic rami fracture without displacement. patient was afebrile and hemodynamically stable. Labs without any significant electrolyte  abnormalities. AKI noted with creatinine 2.56 elevated from baseline 1.7, Elevated BNP 2500, Troponin 120-130, mildly elevated AST/ALT at 229 and 186. Vital signs significant for bradycardia heart rate in the 30s. An EKG showed a 2:1 AV block, and cardiology was consulted. Carvedilol  was washed out due to bradycardia, and glucagon was initiated. The patient was admitted to the ICU, bradycardia continued and the patient underwent temporary pacer placement on 11/8 and permanent pacemaker placement on 11/10. Heparin  was started for DVT prophylaxis, and Lipitor was held due to abnormal liver function tests. The patient was followed for acute on chronic systolic CHF, and Entresto  and SGLT2 were held. The patient was started on irbesartan. Potassium levels were borderline high, and the patient received lokelma , with levels now stable. The patient appeared euvolemic and was started on torsemide. For the right inferior femur ramus fracture, orthopedics recommended conservative management, weight-bearing as tolerated. The patient's A1c was 8.4, and Lantus  was resumed with an increased dose. Prior to arrival the patient was independent at baseline using RW intermittently. On chart review she lives with her son in a 1 level home. Currently mod to max assist with mobility and basic ADLs. Therapy evaluations completed due to patient decreased functional mobility was admitted for a comprehensive rehab program.   Patient currently requires mod with basic self-care skills secondary to muscle weakness, decreased cardiorespiratoy endurance and decreased oxygen support, and decreased standing balance, decreased balance strategies, and difficulty maintaining precautions.  Prior to hospitalization, patient could complete ADL IADLs with modified independent .  Patient will benefit from skilled intervention to decrease level of assist with basic self-care skills, increase independence with basic self-care skills, and increase level of  independence with iADL prior to discharge home with care partner.  Anticipate patient will require 24 hour supervision and follow up home health.  OT - End of Session Activity Tolerance: Tolerates 30+ min activity with multiple rests OT Assessment Rehab Potential (ACUTE ONLY): Good OT Barriers to Discharge: Weight bearing restrictions OT Patient demonstrates impairments in the following area(s): Balance;Sensory;Vision;Endurance;Motor;Pain;Safety OT Basic ADL's Functional Problem(s): Grooming;Bathing;Dressing;Toileting OT Transfers Functional Problem(s): Toilet OT Plan OT Intensity: Minimum of 1-2 x/day, 45 to 90 minutes OT Frequency: 5 out of 7 days OT Duration/Estimated Length of Stay: 12-16 OT Treatment/Interventions: Balance/vestibular training;Disease mangement/prevention;Neuromuscular re-education;Therapeutic Activities;Visual/perceptual remediation/compensation;Psychosocial support;Functional mobility training;Discharge planning;UE/LE Coordination activities;Patient/family education;Community reintegration;UE/LE Strength taining/ROM;Pain management;Skin care/wound managment;DME/adaptive equipment instruction;Cognitive remediation/compensation;Wheelchair propulsion/positioning;Therapeutic Exercise;Self Care/advanced ADL retraining OT Self Feeding Anticipated Outcome(s): mod I OT Basic Self-Care Anticipated Outcome(s): mod I to SUP OT Toileting Anticipated Outcome(s): mod I OT Bathroom Transfers Anticipated Outcome(s): SUP OT Recommendation Recommendations for Other Services: None Patient destination: Home Follow Up Recommendations: Home health OT Equipment Recommended: 3 in 1 bedside comode   OT Evaluation Precautions/Restrictions  Precautions Precautions: ICD/Pacemaker Recall of Precautions/Restrictions: Intact Precaution/Restrictions Comments: pacemaker Restrictions Weight Bearing Restrictions Per Provider Order: Yes LUE Weight Bearing Per Provider Order: Non weight  bearing Other Position/Activity Restrictions: LUE limited wt bearing and ROM post PPM General Chart Reviewed: Yes Response to Previous Treatment: Not applicable Family/Caregiver Present: No Vital Signs Therapy Vitals Temp: 98.6 F (37 C) Temp Source: Oral Pulse Rate: 61 Resp: 18 BP: (!) 142/67 Patient Position (if appropriate): Lying Oxygen Therapy SpO2: 100 % O2 Device: Room Air Pain Pain Assessment Pain Scale: 0-10 Pain Score: 0-No pain Home Living/Prior Functioning Home Living Family/patient expects to be discharged to:: Private residence Living Arrangements: Children, Other relatives Available Help at Discharge: Family, Available PRN/intermittently Type of Home: House Home Access: Level entry Home Layout: Able to live on main level with bedroom/bathroom Bathroom Shower/Tub: Health Visitor: Standard Bathroom Accessibility: Yes Additional Comments: pt's daughter reports that pt doesn't use RW much but when she does, she pushes down hard with her arms and leans very far forward  Lives With: Son IADL History Homemaking Responsibilities: Yes Meal Prep Responsibility: Primary Laundry Responsibility: Primary Cleaning Responsibility: Primary Bill Paying/Finance Responsibility: Primary Shopping Responsibility: Primary Current License: No Mode of Transportation: Family Occupation: Retired Prior Function Level of Independence: Independent with basic ADLs, Independent with homemaking with ambulation, Other (comment)  Able to Take Stairs?: Yes Driving: No Vocation: Retired Administrator, Sports Baseline Vision/History: 6 Macular Degeneration Ability to See in Adequate Light: 2 Moderately impaired Patient Visual Report: No change from baseline Additional Comments: decreased vision in L eye Perception  Perception: Within Functional Limits Praxis Praxis: WFL Cognition Cognition Overall Cognitive Status: Within Functional Limits for tasks assessed Arousal/Alertness:  Awake/alert Orientation Level: Person;Place;Situation Person: Oriented Place: Oriented Situation: Oriented Memory: Appears intact Awareness: Appears intact Problem Solving: Appears intact Safety/Judgment: Appears intact Brief Interview for Mental Status (BIMS) Repetition of Three Words (First Attempt): 3 Temporal Orientation: Year: Correct Temporal Orientation: Month: Accurate within 5 days Temporal Orientation: Day: Correct Recall: Sock: Yes, no cue required Recall: Blue: Yes, no cue required Recall: Bed: No, could not recall BIMS Summary Score: 13 Sensation Sensation Light Touch: Impaired by gross assessment Additional Comments: 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 Coordination Gross Motor Movements are Fluid and Coordinated: No Fine Motor Movements are Fluid and Coordinated: No Finger Nose Finger Test: [facility] Motor  Motor Motor: Within Functional Limits  Trunk/Postural Assessment  Cervical Assessment Cervical Assessment: Exceptions to [facility] (forward head) Thoracic Assessment Thoracic Assessment: Within Functional Limits Lumbar Assessment Lumbar Assessment: Within Functional Limits Postural Control Postural Control: Within Functional Limits  Balance Balance Balance Assessed: Yes Dynamic Sitting Balance Dynamic Sitting - Balance Support: Feet unsupported Dynamic Sitting - Level of Assistance: 5: Stand by assistance Static Standing Balance Static Standing - Balance Support: Right upper extremity supported;Left upper extremity supported Static Standing - Level of Assistance: 5: Stand by assistance;4: Min assist Dynamic Standing Balance Dynamic Standing - Balance Support: Left upper extremity supported;Right upper extremity supported Dynamic Standing - Level of Assistance: 3: Mod assist Extremity/Trunk Assessment RUE Assessment RUE Assessment: Within Functional Limits Active Range of Motion (AROM) Comments: WFL General Strength Comments: 4+/5 LUE Assessment LUE  Assessment: Within Functional Limits Active Range of Motion (AROM) Comments: WFL within precautions General Strength Comments: 4/5  Care Tool Care Tool Self Care Eating   Eating Assist Level: Independent with assistive device    Oral Care    Oral Care Assist Level: Set up assist    Bathing   Body parts bathed by patient: Right arm;Left arm;Face;Chest;Abdomen;Front perineal area;Buttocks;Right upper leg;Left upper leg;Right lower leg;Left lower leg Body parts bathed by helper: Front perineal area;Buttocks   Assist Level: Minimal  Assistance - Patient > 75%    Upper Body Dressing(including orthotics)   What is the patient wearing?: Pull over shirt   Assist Level: Set up assist    Lower Body Dressing (excluding footwear)   What is the patient wearing?: Pants Assist for lower body dressing: Moderate Assistance - Patient 50 - 74%    Putting on/Taking off footwear   What is the patient wearing?: Socks Assist for footwear: Contact Guard/Touching assist       Care Tool Toileting Toileting activity   Assist for toileting: Moderate Assistance - Patient 50 - 74%     Care Tool Bed Mobility Roll left and right activity   Roll left and right assist level: Supervision/Verbal cueing    Sit to lying activity   Sit to lying assist level: Supervision/Verbal cueing    Lying to sitting on side of bed activity   Lying to sitting on side of bed assist level: the ability to move from lying on the back to sitting on the side of the bed with no back support.: Minimal Assistance - Patient > 75%     Care Tool Transfers Sit to stand transfer        Chair/bed transfer   Chair/bed transfer assist level: Moderate Assistance - Patient 50 - 74%     Toilet transfer   Assist Level: Moderate Assistance - Patient 50 - 74%     Care Tool Cognition  Expression of Ideas and Wants Expression of Ideas and Wants: 4. Without difficulty (complex and basic) - expresses complex messages without difficulty  and with speech that is clear and easy to understand  Understanding Verbal and Non-Verbal Content Understanding Verbal and Non-Verbal Content: 4. Understands (complex and basic) - clear comprehension without cues or repetitions   Memory/Recall Ability Memory/Recall Ability : Current season;Location of own room;Staff names and faces;That he or she is in a hospital/hospital unit   Refer to Care Plan for Long Term Goals  SHORT TERM GOAL WEEK 1 OT Short Term Goal 1 (Week 1): patient will complete LB dressing min A with LRAD OT Short Term Goal 2 (Week 1): patient will complete functional toilet transfer with min A OT Short Term Goal 3 (Week 1): patient will tolerate standing 45 seconds with o2 within precaution range and good balance to increase ability to complete standing ADL tasks  Recommendations for other services: None    Skilled Therapeutic Intervention  Evaluation completed (see details above) with education on OT POC and goals and individual treatment initiated with focus on functional mobility/transfers, ADL re-training,  generalized strengthening and endurance, dynamic standing balance/coordination, pt/family education and discharge planning. Pt education provided on therapy schedule and safety policy with use of chair alarm. Pt participated in goal setting. Pt participated in modified ADL task (See above for functional performance). Patient taken off O2 per rn and RN note, allowed to be off 2L as long as patient is above 92%. Patient demonstrated good skill with all ADLs however limited in functional mobility by weakness and hip pain.    ADL ADL Eating: Modified independent Grooming: Setup Upper Body Bathing: Supervision/safety Where Assessed-Upper Body Bathing: Edge of bed Lower Body Bathing: Minimal assistance Where Assessed-Lower Body Bathing: Edge of bed Upper Body Dressing: Setup Where Assessed-Upper Body Dressing: Edge of bed Lower Body Dressing: Minimal assistance Where  Assessed-Lower Body Dressing: Edge of bed Toileting: Moderate assistance Where Assessed-Toileting: Toilet;Bedside Commode Toilet Transfer: Moderate assistance;Maximal assistance Toilet Transfer Method: Stand pivot Toilet Transfer Equipment: Bedside commode Tub/Shower  Transfer Method: Unable to assess Film/video Editor: Not assessed Mobility  Bed Mobility Bed Mobility: Rolling Right;Rolling Left;Supine to Sit;Sit to Supine Rolling Right: Supervision/verbal cueing Rolling Left: Supervision/Verbal cueing Supine to Sit: Minimal Assistance - Patient > 75% Sit to Supine: Minimal Assistance - Patient > 75% Transfers Sit to Stand: Minimal Assistance - Patient > 75%;Moderate Assistance - Patient 50-74% Stand to Sit: Minimal Assistance - Patient > 75%;Moderate Assistance - Patient 50-74%   Discharge Criteria: Patient will be discharged from OT if patient refuses treatment 3 consecutive times without medical reason, if treatment goals not met, if there is a change in medical status, if patient makes no progress towards goals or if patient is discharged from hospital.  The above assessment, treatment plan, treatment alternatives and goals were discussed and mutually agreed upon: by patient  [name] 01/06/2024, 9:29 AM

## 2024-01-06 NOTE — Progress Notes (Signed)
 PROGRESS NOTE   Subjective/Complaints:  Pt doing well, starting therapies. Slept ok, denies pain. LBM overnight. Urinating fine. No other complaints or concerns.   ROS: as per HPI. Denies CP, SOB, abd pain, N/V/D/C, or any other complaints at this time.    Objective:   DG CHEST PORT 1 VIEW Result Date: 01/04/2024 CLINICAL DATA:  Shortness of breath.  Bradycardia. EXAM: PORTABLE CHEST 1 VIEW COMPARISON:  01/01/2024. FINDINGS: Stable cardiomediastinal contours. Stable dual lead left-sided pacemaker. Aortic atherosclerosis. Similar pulmonary vascular congestion. No focal consolidation, sizeable pleural effusion, or pneumothorax. No acute osseous abnormality. IMPRESSION: Mild cardiomegaly with pulmonary vascular congestion, similar to the prior exam. Electronically Signed   By: Harrietta Sherry M.D.   On: 01/04/2024 15:34   Recent Labs    01/05/24 1616  WBC 4.6  HGB 10.1*  HCT 29.6*  PLT 216   Recent Labs    01/05/24 0414 01/05/24 1616  NA 138 139  K 4.6 5.0  CL 104 103  CO2 23 27  GLUCOSE 102* 189*  BUN 40* 40*  CREATININE 1.52* 1.75*  CALCIUM  9.0 9.0        Intake/Output Summary (Last 24 hours) at 01/06/2024 1154 Last data filed at 01/06/2024 9278 Gross per 24 hour  Intake 700 ml  Output 600 ml  Net 100 ml     Wound 01/05/24 1700 Pressure Injury Heel Left Deep Tissue Pressure Injury - Purple or maroon localized area of discolored intact skin or blood-filled blister due to damage of underlying soft tissue from pressure and/or shear. (Active)     Wound 01/05/24 1700 Pressure Injury Heel Right Deep Tissue Pressure Injury - Purple or maroon localized area of discolored intact skin or blood-filled blister due to damage of underlying soft tissue from pressure and/or shear. (Active)    Physical Exam: Vital Signs Blood pressure (!) 142/67, pulse 61, temperature 98.6 F (37 C), temperature source Oral, resp. rate  18, height 5' 3.5 (1.613 m), weight 58.2 kg, SpO2 100%.  Constitutional: No distress . Vital signs reviewed. Awake, alert, appropriate.  HEENT: NCAT, EOMI, oral membranes moist. Blind in L eye.  Neck: supple Cardiovascular: RRR without m/r/g appreciated. No JVD    Respiratory/Chest: CTA Bilaterally without wheezes or rales. Normal effort    GI/Abdomen: soft, +BS throughout, non-tender, non-distended Ext: no clubbing, cyanosis, or edema Psych: pleasant and cooperative Skin: dry, warm   PRIOR EXAMS: Musculoskeletal:     Cervical back: Neck supple.     Comments: Ue's 5-/5 except FA 4/5 on R and 4+/5 on L RLE- HF 2/5 limited due to pain; otherwise KE/KF 3+/5; and DF 4/5 and PF 4+/5 LLE- HF 3-/5; otherwise the same in KE/KF/DF/PF  as RLE   TTP around R hip- no incision  Skin:    General: Skin is warm and dry.     Comments: R forearm IV- looks OK Skin dry- flaking on LE's  Neurological:     Mental Status: She is alert and oriented to person, place, and time.     Comments: Intact to light touch in LE's- except feet decreased- from her chronic neuropathy  Psychiatric:        Mood and  Affect: Mood normal.        Behavior: Behavior normal.     Comments: A little flat- appears frustrated with bad food   Assessment/Plan: 1. Functional deficits which require 3+ hours per day of interdisciplinary therapy in a comprehensive inpatient rehab setting. Physiatrist is providing close team supervision and 24 hour management of active medical problems listed below. Physiatrist and rehab team continue to assess barriers to discharge/monitor patient progress toward functional and medical goals  Care Tool:  Bathing    Body parts bathed by patient: Right arm, Left arm, Face, Chest, Abdomen, Front perineal area, Buttocks, Right upper leg, Left upper leg, Right lower leg, Left lower leg   Body parts bathed by helper: Front perineal area, Buttocks     Bathing assist Assist Level: Minimal  Assistance - Patient > 75%     Upper Body Dressing/Undressing Upper body dressing   What is the patient wearing?: Pull over shirt    Upper body assist Assist Level: Set up assist    Lower Body Dressing/Undressing Lower body dressing      What is the patient wearing?: Pants     Lower body assist Assist for lower body dressing: Moderate Assistance - Patient 50 - 74%     Toileting Toileting    Toileting assist Assist for toileting: Moderate Assistance - Patient 50 - 74%     Transfers Chair/bed transfer  Transfers assist     Chair/bed transfer assist level: Moderate Assistance - Patient 50 - 74%     Locomotion Ambulation   Ambulation assist              Walk 10 feet activity   Assist           Walk 50 feet activity   Assist           Walk 150 feet activity   Assist           Walk 10 feet on uneven surface  activity   Assist           Wheelchair     Assist               Wheelchair 50 feet with 2 turns activity    Assist            Wheelchair 150 feet activity     Assist          Blood pressure (!) 142/67, pulse 61, temperature 98.6 F (37 C), temperature source Oral, resp. rate 18, height 5' 3.5 (1.613 m), weight 58.2 kg, SpO2 100%.  Medical Problem List and Plan: 1. Functional deficits secondary to Debility from  Acute on chronic CHF exacerbation after syncope with AV block/pacemaker placement             -patient may  shower- pacemaker placed 11/10             -ELOS/Goals: 14-16 days- mod I to supervision  -Continue CIR   2.  Antithrombotics: -DVT/anticoagulation:  Mechanical: Sequential compression devices, below knee Bilateral lower extremities -Pharmaceutical: Lovenox  30mg  daily start in CIR             -antiplatelet therapy: N/A -Venous Duplex pending.    3. Pain Management: Oxycodone , Tylenol  and lidocaine  patch prn.    4. Mood/Behavior/Sleep: LCSW to follow for evaluation and  support when available.              -antipsychotic agents: n/a             -  sleep: melatonin 5 mg , trazodone PRN   5. Neuropsych/cognition: This patient is capable of making decisions on her own behalf.   6. Skin/Wound Care: Routine pressure relief measures. Monitor chest surgical site for infection.    7. Fluids/Electrolytes/Nutrition: Monitor I&O and weight, continue vitamins/supplements. Follow up labs CBC/CMP     8. 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   -HR stable, monitor   9. Acute/Chronic systolic CHF: hx of Nonischemic Cardiomyopathy-Holding Entresto  and SGLT2 due to AKI on CKD3B. Continue Irbesartan 300mg  daily and Torsemide 20mg  daily.  -01/06/24 Wt down but suspect might be inaccurate, monitor trend Filed Weights   01/05/24 1537 01/06/24 0601  Weight: 62.9 kg 58.2 kg      10. AKI on CKD IIIB: on Torsemide, monitor CMP. Cr 1.69 11/14>> 1.75 on 11/15   11. T2DM: uncontrolled, A1C 8.1- monitor glucose ac/hs with SSI, Lantus  20U daily resumed. Jardience to be resumed when mobility improves.   -01/06/24 CBGs ok, monitor CBG (last 3)  Recent Labs    01/05/24 2043 01/06/24 0604 01/06/24 1129  GLUCAP 178* 121* 149*      12. Right Inferior Pubic Rami Fx: conservative management, weightbearing as tolerated.    13. HLD: Lipitor 40 mg    14. Constipation: Senna 8.6mg  daily and Miralax daily.  -gave Sorbitol 30ml and SSE today after admission since has been >1 week since decent BM-- successful 11/15 -01/06/24 LBM last night, monitor on current regimen  15. Hyperkalemia: Received Lokelma  x1, trend labs. K+ 5.0- will recheck labs after admission  16. Anemia-chronic: Hgb 10's, monitor, might benefit from iron supplement?    LOS: 1 days A FACE TO FACE EVALUATION WAS PERFORMED  [address] 01/06/2024, 11:54 AM

## 2024-01-06 NOTE — Evaluation (Signed)
 Physical Therapy Assessment and Plan  Patient Details  Name: [name] MRN: [id number] Date of Birth: April 10, 1945  PT Diagnosis: Abnormality of gait, Difficulty walking, Impaired sensation, Muscle weakness, and Pain in R hip  Rehab Potential: Good ELOS: 14 to 16 days   Today's Date: 01/06/2024 PT Individual Time: 9054-8899 PT Individual Time Calculation (min): 75 min    Hospital Problem: Principal Problem:   Debility Active Problems:   CHF exacerbation (HCC)   Past Medical History:  Past Medical History:  Diagnosis Date   Breast cancer (HCC)    breast - left    Chronic diastolic CHF (congestive heart failure), NYHA class 2 (HCC)    a. cMRI 5/10: EF 38% // b. Echo 3/13/: EF 50-55, Gr 1 DD // c. Echo 8/14: EF 40, inf/inf-septal HK, Gr 1 DD, mild MR // d. Echo 5/17: EF 40-45, inf HK, Gr 1 DD, mild MR, mild LAE, reduced RVSF, mild TR, PASP 33 // e. Echo 10/17: EF 40-45, Gr 2 DD, mild MR, severe LAE; => 09/2020 EF 50-55%, GrII DD.   Coronary artery disease, non-occlusive    a. nonobs by LHC in 2010 // b. Myoview  10/17: Lg infarct apex, distal ant and lat walls, no ischemia, EF 38; int risk  // c. LHC 10/17:  dLAD 15, pLCx 30, mRCA 40, LVEDP 15   Diabetes mellitus    NICM (nonischemic cardiomyopathy) (HCC)    Past Surgical History:  Past Surgical History:  Procedure Laterality Date   APPENDECTOMY     BREAST LUMPECTOMY     lt breast   CARDIAC CATHETERIZATION N/A 12/20/2015   Procedure: Left Heart Cath and Coronary Angiography;  Surgeon: Lonni Hanson, MD:: Mild to moderate, nonobstructive coronary artery disease, including 15% distal LAD stenosis, 30% ulcerated mid LCx disease, and 40% mid RCA stenosis.  Upper normal left ventricular filling pressure (LVEDP 15 mmHg).   PACEMAKER IMPLANT N/A 12/31/2023   Procedure: PACEMAKER IMPLANT;  Surgeon: Almetta Donnice LABOR, MD;  Location: [facility] INVASIVE CV LAB;  Service: Cardiovascular;  Laterality: N/A;   RIGHT HEART CATH N/A 05/08/2016    Procedure: Right Heart Cath;  Surgeon: Toribio JONELLE Fuel, MD;  Location: [facility] INVASIVE CV LAB;; RA = 3, RV = 34/5, PA = 38/12 (21), PCW = 12; Fick cardiac output/index = 4.6/2.6; PVR = 1.9 WU, Ao sat = 96%; PA sat = 58%, 62%    TEMPORARY PACEMAKER Right 12/29/2023   Procedure: TEMPORARY PACEMAKER;  Surgeon: Anner Alm ORN, MD;  Location: [facility] INVASIVE CV LAB;  Service: Cardiovascular;  Laterality: Right;   TRANSTHORACIC ECHOCARDIOGRAM  11/2015   a) 11/2015: EF 40 and 45%.  GRII DD; b) 04/2016: EF 20% with grade 2 diastolic dysfunction   TRANSTHORACIC ECHOCARDIOGRAM  10/2016   a) EF 55-60%.  Normal LV function.  Mild DD.[name]  Normal valve Fxn.; b) 09/2020: EF 50 to 55%.  Normal function.  No R WMA.  Mild LVH.  GR 2 DD with elevated LAP.-Mild LA dilation.  Moderately elevated PAP estimated RVSP 47 mmHg, and RAP 8 mmHg.   TRIGGER FINGER RELEASE Left 04/23/2017   Procedure: RELEASE TRIGGER FINGER TO LEFT FORTH FINGER;  Surgeon: Marcus Lung, MD;  Location: [facility];  Service: Plastics;  Laterality: Left;    Assessment & Plan Clinical Impression: Patient is a 78 year old R handed female with PMHx chronic systolic HF, CAD, CKD3B with baseline Cr of ~1.7, HTN, HLD, type 2 DM, tobacco use, breast cancer s/p lumpectomy/radiation and chemotherapy.  The patient presented at [facility] on 12/28/2023, via EMS after experiencing a witnessed syncopal episode by the patient's daughter. It was reported that the patient hit their head on the refrigerator. The patient complained of increased shortness of breath on exertion and right hip pain, along with the sudden onset of nausea, vomiting, and diarrhea. Upon EMS arrival, the patient's heart rate was noted to be in the 30s, atropine was administered en route to the ED. CT head was unremarkable. A CT of the pelvis revealed a right inferior pubic rami fracture without displacement. patient was afebrile and hemodynamically stable. Labs without any  significant electrolyte abnormalities. AKI noted with creatinine 2.56 elevated from baseline 1.7, Elevated BNP 2500, Troponin 120-130, mildly elevated AST/ALT at 229 and 186. Vital signs significant for bradycardia heart rate in the 30s. An EKG showed a 2:1 AV block, and cardiology was consulted. Carvedilol  was washed out due to bradycardia, and glucagon was initiated. The patient was admitted to the ICU, bradycardia continued and the patient underwent temporary pacer placement on 11/8 and permanent pacemaker placement on 11/10. Heparin  was started for DVT prophylaxis, and Lipitor was held due to abnormal liver function tests. The patient was followed for acute on chronic systolic CHF, and Entresto  and SGLT2 were held. The patient was started on irbesartan. Potassium levels were borderline high, and the patient received lokelma , with levels now stable. The patient appeared euvolemic and was started on torsemide. For the right inferior femur ramus fracture, orthopedics recommended conservative management, weight-bearing as tolerated. The patient's A1c was 8.4, and Lantus  was resumed with an increased dose. Prior to arrival the patient was independent at baseline using RW intermittently. On chart review she lives with her son in a 1 level home. Currently mod to max assist with mobility and basic ADLs. Therapy evaluations completed due to patient decreased functional mobility was admitted for a comprehensive rehab program.   Patient transferred to CIR on 01/05/2024 .   Patient currently requires mod with mobility secondary to muscle weakness, decreased cardiorespiratoy endurance, and decreased memory.  Prior to hospitalization, patient was modified independent  with mobility and lived with Son in a House home.  Home access is   (door sill to enter home).  Patient will benefit from skilled PT intervention to maximize safe functional mobility, minimize fall risk, and decrease caregiver burden for planned discharge  home with 24 hour supervision.  Anticipate patient will benefit from follow up HH at discharge.  PT - End of Session Activity Tolerance: Tolerates 30+ min activity with multiple rests Endurance Deficit: Yes PT Assessment Rehab Potential (ACUTE/IP ONLY): Good PT Barriers to Discharge: Inaccessible home environment PT Barriers to Discharge Comments: door sill to enter home PT Patient demonstrates impairments in the following area(s): Balance;Endurance;Motor PT Transfers Functional Problem(s): Bed Mobility;Bed to Chair;Car PT Locomotion Functional Problem(s): Ambulation;Stairs PT Plan PT Intensity: Minimum of 1-2 x/day ,45 to 90 minutes PT Frequency: 5 out of 7 days PT Duration Estimated Length of Stay: 14 to 16 days PT Treatment/Interventions: Ambulation/gait training;Balance/vestibular training;Community reintegration;Discharge planning;Neuromuscular re-education;Functional mobility training;DME/adaptive equipment instruction;Pain management;Patient/family education;Splinting/orthotics;UE/LE Coordination activities;UE/LE Strength taining/ROM;Therapeutic Exercise;Therapeutic Activities;Wheelchair propulsion/positioning;Stair training PT Transfers Anticipated Outcome(s): Mod I transfers PT Locomotion Anticipated Outcome(s): Mod I for gait, S for stairs PT Recommendation Recommendations for Other Services: None Follow Up Recommendations: Home health PT Patient destination: Home Equipment Recommended: To be determined   PT Evaluation Precautions/Restrictions Precautions Precautions: ICD/Pacemaker Recall of Precautions/Restrictions: Intact Precaution/Restrictions Comments: pacemaker Restrictions Weight Bearing Restrictions Per Provider Order: Yes  LUE Weight Bearing Per Provider Order: Non weight bearing RLE Weight Bearing Per Provider Order: Weight bearing as tolerated Other Position/Activity Restrictions: LUE limited wt bearing and ROM post PPM General Chart Reviewed:  Yes Family/Caregiver Present: No  Pain Pain Assessment Pain Scale: 0-10 Pain Score: 0-No pain Pain Interference Pain Interference Pain Effect on Sleep: 1. Rarely or not at all Pain Interference with Therapy Activities: 3. Frequently Pain Interference with Day-to-Day Activities: 3. Frequently Home Living/Prior Functioning Home Living Available Help at Discharge: Family;Available PRN/intermittently Type of Home: House Home Access:  (door sill to enter home) Home Layout: Able to live on main level with bedroom/bathroom Additional Comments: Pt has a rollator walker, doesn;t utilize it inside the home as per patient  Lives With: Son Prior Function Level of Independence: Independent with gait;Independent with transfers  Able to Take Stairs?: Yes Driving: No Vocation: Retired Optometrist - History Ability to See in Adequate Light: 2 Moderately impaired Perception Perception: Within Functional Limits Praxis Praxis: WFL  Cognition Overall Cognitive Status: Within Functional Limits for tasks assessed Arousal/Alertness: Awake/alert Orientation Level: Oriented X4 Year: 2025 Month: November Day of Week: Correct Memory: Impaired Memory Impairment: Decreased recall of new information (in regards to WB precautions L UE) Awareness: Appears intact Problem Solving: Appears intact Safety/Judgment: Appears intact Sensation Sensation Light Touch: Impaired Detail Light Touch Impaired Details: Impaired LLE;Impaired RLE (diabetic neuropathy, decreased B feet) Coordination Gross Motor Movements are Fluid and Coordinated: No Motor  Motor Motor: Within Functional Limits  Mobility Bed Mobility Bed Mobility: Rolling Right;Rolling Left;Supine to Sit;Sit to Supine Rolling Right: Supervision/verbal cueing Rolling Left: Supervision/Verbal cueing Supine to Sit: Moderate Assistance - Patient 50-74%;Minimal Assistance - Patient > 75% Sit to Supine: Minimal Assistance - Patient >  75% Transfers Transfers: Sit to Stand;Stand to Sit;Stand Pivot Transfers Sit to Stand: Minimal Assistance - Patient > 75%;Moderate Assistance - Patient 50-74% Stand to Sit: Moderate Assistance - Patient 50-74%;Minimal Assistance - Patient > 75% Stand Pivot Transfers: Moderate Assistance - Patient 50 - 74% Stand Pivot Transfer Details: Verbal cues for precautions/safety;Verbal cues for technique Stand Pivot Transfer Details (indicate cue type and reason): limited by pain Transfer (Assistive device): Hemi-walker Locomotion Gait Ambulation: Yes Gait Assistance: Moderate Assistance - Patient 50-74% Gait Distance (Feet): 15 Feet Assistive device: Hemi-walker Gait Gait: Yes Gait Pattern: Antalgic Gait velocity: decreased Stairs / Additional Locomotion Stairs: Yes (not assessed due to pain) Wheelchair Mobility Wheelchair Mobility: Yes Wheelchair Assistance: Dependent - Patient 0% Distance: 150 Trunk/Postural Assessment  Cervical Assessment Cervical Assessment: Exceptions to [facility] (forward head posture) Thoracic Assessment Thoracic Assessment: Within Functional Limits Lumbar Assessment Lumbar Assessment: Within Functional Limits Postural Control Postural Control: Within Functional Limits  Balance Balance Balance Assessed: Yes Static Sitting Balance Static Sitting - Balance Support: Feet supported;Right upper extremity supported Static Sitting - Level of Assistance: 5: Stand by assistance Dynamic Sitting Balance Dynamic Sitting - Balance Support: Feet unsupported Dynamic Sitting - Level of Assistance: 5: Stand by assistance Static Standing Balance Static Standing - Balance Support: Right upper extremity supported;Left upper extremity supported Static Standing - Level of Assistance: 5: Stand by assistance;4: Min assist Dynamic Standing Balance Dynamic Standing - Balance Support: Left upper extremity supported;Right upper extremity supported Dynamic Standing - Level of Assistance:  3: Mod assist;2: Max assist Extremity Assessment  RUE Assessment RUE Assessment: Within Functional Limits Active Range of Motion (AROM) Comments: WFL General Strength Comments: 4+/5 LUE Assessment LUE Assessment: Within Functional Limits Active Range of Motion (AROM) Comments: WFL within precautions General  Strength Comments: 4/5 RLE Assessment RLE Assessment: Exceptions to [facility] Active Range of Motion (AROM) Comments: WFLs except DF to neutral General Strength Comments: grossly 3/5 LLE Assessment LLE Assessment: Exceptions to [facility] Active Range of Motion (AROM) Comments: WFLs except DF to neural General Strength Comments: grossly 4/5  Care Tool Care Tool Bed Mobility Roll left and right activity   Roll left and right assist level: Supervision/Verbal cueing    Sit to lying activity   Sit to lying assist level: Supervision/Verbal cueing    Lying to sitting on side of bed activity   Lying to sitting on side of bed assist level: the ability to move from lying on the back to sitting on the side of the bed with no back support.: Moderate Assistance - Patient 50 - 74%     Care Tool Transfers Sit to stand transfer   Sit to stand assist level: Minimal Assistance - Patient > 75%    Chair/bed transfer   Chair/bed transfer assist level: Moderate Assistance - Patient 50 - 74%    Car transfer   Car transfer assist level: Maximal Assistance - Patient 25 - 49%;Moderate Assistance - Patient 50 - 74%      Care Tool Locomotion Ambulation   Assist level: Moderate Assistance - Patient 50 - 74% Assistive device: Walker-hemi Max distance: 15  Walk 10 feet activity   Assist level: Moderate Assistance - Patient - 50 - 74% Assistive device: Walker-hemi   Walk 50 feet with 2 turns activity Walk 50 feet with 2 turns activity did not occur: Safety/medical concerns      Walk 150 feet activity Walk 150 feet activity did not occur: Safety/medical concerns      Walk 10 feet on uneven surfaces  activity Walk 10 feet on uneven surfaces activity did not occur: Safety/medical concerns      Stairs Stair activity did not occur: Safety/medical concerns        Walk up/down 1 step activity Walk up/down 1 step or curb (drop down) activity did not occur: Safety/medical concerns      Walk up/down 4 steps activity Walk up/down 4 steps activity did not occur: Safety/medical concerns      Walk up/down 12 steps activity Walk up/down 12 steps activity did not occur: Safety/medical concerns      Pick up small objects from floor Pick up small object from the floor (from standing position) activity did not occur: Safety/medical concerns      Wheelchair Is the patient using a wheelchair?: No Type of Wheelchair: Manual   Wheelchair assist level: Dependent - Patient 0% Max wheelchair distance: 150  Wheel 50 feet with 2 turns activity   Assist Level: Dependent - Patient 0%  Wheel 150 feet activity   Assist Level: Dependent - Patient 0%    Refer to Care Plan for Long Term Goals  SHORT TERM GOAL WEEK 1 PT Short Term Goal 1 (Week 1): Pt will increase bed mobility to contact gaurd. PT Short Term Goal 2 (Week 1): Pt will increase sit to stand transfers with LRAD to contact gaurd. PT Short Term Goal 3 (Week 1): Pt will increase transfers with LRAD to min A PT Short Term Goal 4 (Week 1): Pt will ascend/descend 1 step with hemiwalker and mod A. PT Short Term Goal 5 (Week 1): Pt will ambulate about 50 feet with LRAD and min A.  Recommendations for other services: None   Skilled Therapeutic Intervention PT evaluation completed and treatment plan initiated. Pt requires  frequent cues to remember WB L UE during functional mobility. Pt performed multiple transfers sit to stand with min A and stand pivot transfers with mod A and verbal cues. Pt ambulated 5 feet x 3 with hemiwalker and mod A with verbal cues. Pt requires frequent rest breaks due to decreased activity tolerance. Pt does an excellent job  with B WB on LEs, increased assistance and fear with unilateral WB on R LE.  Pt returned to room following treatment. Pt transferred w/c to edge of bed with mod A and verbal cues. Pt transferred edge of bed to supine with min A. Pt left sitting up in bed with bed alarm and all needs within reach.    Discharge Criteria: Patient will be discharged from PT if patient refuses treatment 3 consecutive times without medical reason, if treatment goals not met, if there is a change in medical status, if patient makes no progress towards goals or if patient is discharged from hospital.  The above assessment, treatment plan, treatment alternatives and goals were discussed and mutually agreed upon: by patient  [name] 01/06/2024, 12:15 PM

## 2024-01-06 NOTE — Progress Notes (Addendum)
 Patient remains stable no issues noted. Patient in bed, call bell within reach.   Geni Armor, LPN

## 2024-01-06 NOTE — Plan of Care (Signed)
  Problem: RH Balance Goal: LTG: Patient will maintain dynamic sitting balance (OT) Description: LTG:  Patient will maintain dynamic sitting balance with assistance during activities of daily living (OT) Flowsheets (Taken 01/06/2024 0924) LTG: Pt will maintain dynamic sitting balance during ADLs with: Independent with assistive device Goal: LTG Patient will maintain dynamic standing with ADLs (OT) Description: LTG:  Patient will maintain dynamic standing balance with assist during activities of daily living (OT)  Flowsheets (Taken 01/06/2024 0924) LTG: Pt will maintain dynamic standing balance during ADLs with: Supervision/Verbal cueing   Problem: Sit to Stand Goal: LTG:  Patient will perform sit to stand in prep for activites of daily living with assistance level (OT) Description: LTG:  Patient will perform sit to stand in prep for activites of daily living with assistance level (OT) Flowsheets (Taken 01/06/2024 0924) LTG: PT will perform sit to stand in prep for activites of daily living with assistance level: Supervision/Verbal cueing   Problem: RH Grooming Goal: LTG Patient will perform grooming w/assist,cues/equip (OT) Description: LTG: Patient will perform grooming with assist, with/without cues using equipment (OT) Flowsheets (Taken 01/06/2024 0924) LTG: Pt will perform grooming with assistance level of: Independent with assistive device    Problem: RH Bathing Goal: LTG Patient will bathe all body parts with assist levels (OT) Description: LTG: Patient will bathe all body parts with assist levels (OT) Flowsheets (Taken 01/06/2024 0924) LTG: Pt will perform bathing with assistance level/cueing: Supervision/Verbal cueing   Problem: RH Dressing Goal: LTG Patient will perform upper body dressing (OT) Description: LTG Patient will perform upper body dressing with assist, with/without cues (OT). Flowsheets (Taken 01/06/2024 0924) LTG: Pt will perform upper body dressing with  assistance level of: Independent with assistive device Goal: LTG Patient will perform lower body dressing w/assist (OT) Description: LTG: Patient will perform lower body dressing with assist, with/without cues in positioning using equipment (OT) Flowsheets (Taken 01/06/2024 0924) LTG: Pt will perform lower body dressing with assistance level of: Supervision/Verbal cueing   Problem: RH Toileting Goal: LTG Patient will perform toileting task (3/3 steps) with assistance level (OT) Description: LTG: Patient will perform toileting task (3/3 steps) with assistance level (OT)  Flowsheets (Taken 01/06/2024 0924) LTG: Pt will perform toileting task (3/3 steps) with assistance level: Supervision/Verbal cueing

## 2024-01-06 NOTE — Plan of Care (Signed)
  Problem: Consults Goal: RH GENERAL PATIENT EDUCATION Description: See Patient Education module for education specifics. Outcome: Progressing   Problem: RH BOWEL ELIMINATION Goal: RH STG MANAGE BOWEL WITH ASSISTANCE Description: STG Manage Bowel with Assistance. Outcome: Progressing Goal: RH STG MANAGE BOWEL W/MEDICATION W/ASSISTANCE Description: STG Manage Bowel with Medication with Assistance. Outcome: Progressing   Problem: RH BLADDER ELIMINATION Goal: RH STG MANAGE BLADDER WITH ASSISTANCE Description: STG Manage Bladder With Assistance Outcome: Progressing Goal: RH STG MANAGE BLADDER WITH MEDICATION WITH ASSISTANCE Description: STG Manage Bladder With Medication With Assistance. Outcome: Progressing

## 2024-01-07 ENCOUNTER — Ambulatory Visit (HOSPITAL_COMMUNITY): Attending: Student

## 2024-01-07 DIAGNOSIS — R5381 Other malaise: Secondary | ICD-10-CM

## 2024-01-07 DIAGNOSIS — M79605 Pain in left leg: Secondary | ICD-10-CM | POA: Insufficient documentation

## 2024-01-07 DIAGNOSIS — R609 Edema, unspecified: Secondary | ICD-10-CM | POA: Insufficient documentation

## 2024-01-07 DIAGNOSIS — M79604 Pain in right leg: Secondary | ICD-10-CM | POA: Diagnosis present

## 2024-01-07 LAB — COMPREHENSIVE METABOLIC PANEL WITH GFR
ALT: 16 U/L (ref 0–44)
AST: 16 U/L (ref 15–41)
Albumin: 2.7 g/dL — ABNORMAL LOW (ref 3.5–5.0)
Alkaline Phosphatase: 95 U/L (ref 38–126)
Anion gap: 10 (ref 5–15)
BUN: 42 mg/dL — ABNORMAL HIGH (ref 8–23)
CO2: 24 mmol/L (ref 22–32)
Calcium: 8.8 mg/dL — ABNORMAL LOW (ref 8.9–10.3)
Chloride: 104 mmol/L (ref 98–111)
Creatinine, Ser: 1.81 mg/dL — ABNORMAL HIGH (ref 0.44–1.00)
GFR, Estimated: 28 mL/min — ABNORMAL LOW (ref >60)
Glucose, Bld: 95 mg/dL (ref 70–99)
Potassium: 4.4 mmol/L (ref 3.5–5.1)
Sodium: 138 mmol/L (ref 135–145)
Total Bilirubin: 0.9 mg/dL (ref 0.0–1.2)
Total Protein: 5.6 g/dL — ABNORMAL LOW (ref 6.5–8.1)

## 2024-01-07 LAB — CBC
HCT: 28.2 % — ABNORMAL LOW (ref 36.0–46.0)
Hemoglobin: 9.4 g/dL — ABNORMAL LOW (ref 12.0–15.0)
MCH: 31 pg (ref 26.0–34.0)
MCHC: 33.3 g/dL (ref 30.0–36.0)
MCV: 93.1 fL (ref 80.0–100.0)
Platelets: 215 K/uL (ref 150–400)
RBC: 3.03 MIL/uL — ABNORMAL LOW (ref 3.87–5.11)
RDW: 13.4 % (ref 11.5–15.5)
WBC: 5.2 K/uL (ref 4.0–10.5)
nRBC: 0 % (ref 0.0–0.2)

## 2024-01-07 LAB — GLUCOSE, CAPILLARY
Glucose-Capillary: 91 mg/dL (ref 70–99)
Glucose-Capillary: 154 mg/dL — ABNORMAL HIGH (ref 70–99)
Glucose-Capillary: 214 mg/dL — ABNORMAL HIGH (ref 70–99)
Glucose-Capillary: 206 mg/dL — ABNORMAL HIGH (ref 70–99)

## 2024-01-07 MED ORDER — INSULIN GLARGINE-YFGN 100 UNIT/ML ~~LOC~~ SOLN
21.0000 [IU] | Freq: Every day | SUBCUTANEOUS | Status: DC
  Administered 2024-01-08 – 2024-01-09 (×2): 21 [IU] via SUBCUTANEOUS
  Filled 2024-01-07 (×2): qty 0.21

## 2024-01-07 NOTE — Progress Notes (Signed)
 Inpatient Rehabilitation  Patient information reviewed and entered into eRehab system by [facility]. Karen Kays., CCC/SLP, PPS Coordinator.  Information including medical coding, functional ability and quality indicators will be reviewed and updated through discharge.

## 2024-01-07 NOTE — Progress Notes (Signed)
 Occupational Therapy Session Note  Patient Details  Name: [name] MRN: [id number] Date of Birth: [birth year]  Today's Date: 01/07/2024 OT Individual Time: 1305-1400 OT Individual Time Calculation (min): 55 min    Short Term Goals: Week 1:  OT Short Term Goal 1 (Week 1): patient will complete LB dressing min A with LRAD OT Short Term Goal 2 (Week 1): patient will complete functional toilet transfer with min A OT Short Term Goal 3 (Week 1): patient will tolerate standing 45 seconds with o2 within precaution range and good balance to increase ability to complete standing ADL tasks  Skilled Therapeutic Interventions/Progress Updates:   Pt greeted sitting in [facility], family present at side, leaving shortly after start of session. Pt with reports of R-hip pain with mobility, rest provided as needed. Pt with need to void, WC transport from room<>bathroom. Stand-pivot from WC<>BSC/toilet with Min A + use of grab bar with RUE. Mod A provided for 3/3 toileting tasks, care with setup A in sitting. Pt dependently transported from room<>ortho gym. In ortho gym, pt instructed in functional mobility, 2x ~5ft, Mod A provided, including max multimodal cuing for sequencing + hemiRW with RUE. Pt completes 5 x 2 min cycles of Nustep modality for BLE strengthening/endurance needed during ADLs/functional transfers. Pt tolerates level 1-2 of resistance with ~1 min rest breaks in between cycles. Pt remained sitting in WC, NT present.  Therapy Documentation Precautions:  Precautions Precautions: ICD/Pacemaker Recall of Precautions/Restrictions: Intact Precaution/Restrictions Comments: pacemaker Restrictions Weight Bearing Restrictions Per Provider Order: Yes LUE Weight Bearing Per Provider Order: Non weight bearing RLE Weight Bearing Per Provider Order: Weight bearing as tolerated Other Position/Activity Restrictions: LUE limited wt bearing and ROM post PPM   Therapy/Group: Individual Therapy  Nereida Habermann, OTR/L, MSOT  01/07/2024, 12:54 PM

## 2024-01-07 NOTE — Progress Notes (Signed)
 Patient ID: [name], female   DOB: [birth year], 78 y.o.   MRN: [id number]  Statement of service delivered.

## 2024-01-07 NOTE — Progress Notes (Signed)
 Occupational Therapy Session Note  Patient Details  Name: [name] MRN: [id number] Date of Birth: Jul 08, 1945  Today's Date: 01/07/2024 OT Individual Time: 8954-8843 OT Individual Time Calculation (min): 71 min    Short Term Goals: Week 1:  OT Short Term Goal 1 (Week 1): patient will complete LB dressing min A with LRAD OT Short Term Goal 2 (Week 1): patient will complete functional toilet transfer with min A OT Short Term Goal 3 (Week 1): patient will tolerate standing 45 seconds with o2 within precaution range and good balance to increase ability to complete standing ADL tasks   Skilled Therapeutic Interventions/Progress Updates:    Pt up in wheelchair at time of session with daughter present, no pain at rest, minimal discomfort reported during transfers and did not rate. Pt's daughter having several concerns and pt stating as well regarding urinating in bed and calling for help - let pt know that shift change is at 7 and recommended calling when she wakes up to make sure she can get to the bathroom in time to avoid accidents. Extensive education in room with pt regarding low vision adaptive techniques for high contrast for ADL and fall prevention as well as keeping items in the same places at home for consistency. Pt performing stand pivot transfers with RUE only and holding LUE on stomach. Toilet transfer MIN A, MOD for clothing management but able to perform anterior hygiene. Wheelchair level at sink for oral hygiene, grooming, face washing all with cues to locate items 2/2 low light and low vision. Up in wheelchair at end of session alarm on call bell in reach all needs met and oriented to table and surroundings.   Therapy Documentation Precautions:  Precautions Precautions: ICD/Pacemaker Recall of Precautions/Restrictions: Intact Precaution/Restrictions Comments: pacemaker Restrictions Weight Bearing Restrictions Per Provider Order: Yes LUE Weight Bearing Per Provider Order: Non  weight bearing RLE Weight Bearing Per Provider Order: Weight bearing as tolerated Other Position/Activity Restrictions: LUE limited wt bearing and ROM post PPM     Therapy/Group: Individual Therapy  [name] 01/07/2024, 7:27 AM

## 2024-01-07 NOTE — Progress Notes (Signed)
 Physical Therapy Session Note  Patient Details  Name: [name] MRN: [id number] Date of Birth: 08/17/1945  Today's Date: 01/07/2024 PT Individual Time: 0835-0930 PT Individual Time Calculation (min): 55 min   Short Term Goals: Week 1:  PT Short Term Goal 1 (Week 1): Pt will increase bed mobility to contact gaurd. PT Short Term Goal 2 (Week 1): Pt will increase sit to stand transfers with LRAD to contact gaurd. PT Short Term Goal 3 (Week 1): Pt will increase transfers with LRAD to min A PT Short Term Goal 4 (Week 1): Pt will ascend/descend 1 step with hemiwalker and mod A. PT Short Term Goal 5 (Week 1): Pt will ambulate about 50 feet with LRAD and min A.  Skilled Therapeutic Interventions/Progress Updates:    Pt presents in room in bed, slow to respond, requires increased time to encourage participation due to pt feeling upset about having urinary incontinence. Pt denies pain at start of session. Pt mood improves with entrance of pt daughter who provides encouragement. Pt completes bed mobility with min assist, demonstrating improved adherence to LUE NWB precautions, min assist provided for managing BLEs off EOB and scooting towards EOB. Pt completes sit to stand with min assist to [facility]. Pt maintains standing x4 minutes for standing tolerance and static standing balance, therapist provides total assist for managing pants, brief, and periarea hygiene with incontinent bladder, charted. Pt completes seated rest break, then stands and compeltes stand step transfer with min/mod assist, min assist for balance, mod assist for managing HW, max verbal cues for sequencing. Pt then transported to day room dependently via WC, requires extended seated rest break and redirection to task. Pt completes sit to stand to [facility] with min assist from [facility], attempts to ambulate ~3' with HW with mod/max assist, poor weightbearing tolerance on RLE despite cues for use of HW to improve tolerance, therapist providing cues for  breathing and +2 WC follow for safety. Pt comes to sitting in WC, reports poor tolerance to sitting in WC. Pt provided with ROHO cushion to improve sitting tolerance with pt stating significant improvement and agreeable to remain seated up between therapy sessions. Pt returns to room and remains seated in [facility] with all needs within reach, cal light in place and chair alarm donned and activated at end of session.   Therapy Documentation Precautions:  Precautions Precautions: ICD/Pacemaker Recall of Precautions/Restrictions: Intact Precaution/Restrictions Comments: pacemaker Restrictions Weight Bearing Restrictions Per Provider Order: Yes LUE Weight Bearing Per Provider Order: Non weight bearing RLE Weight Bearing Per Provider Order: Weight bearing as tolerated Other Position/Activity Restrictions: LUE limited wt bearing and ROM post PPM    Therapy/Group: Individual Therapy  Reche Ohara PT, DPT 01/07/2024, 5:38 PM

## 2024-01-07 NOTE — Progress Notes (Signed)
 PROGRESS NOTE   Subjective/Complaints: No complaints this morning Hgb and Cr are worse this morning, repeat tomorrow to trend, stool occult ordered CBGs 91-211  ROS: as per HPI. Denies CP, SOB, abd pain, N/V/D/C, or any other complaints at this time.    Objective:   No results found.  Recent Labs    01/05/24 1616 01/07/24 0446  WBC 4.6 5.2  HGB 10.1* 9.4*  HCT 29.6* 28.2*  PLT 216 215   Recent Labs    01/05/24 1616 01/07/24 0446  NA 139 138  K 5.0 4.4  CL 103 104  CO2 27 24  GLUCOSE 189* 95  BUN 40* 42*  CREATININE 1.75* 1.81*  CALCIUM  9.0 8.8*        Intake/Output Summary (Last 24 hours) at 01/07/2024 1248 Last data filed at 01/07/2024 0900 Gross per 24 hour  Intake 717 ml  Output 525 ml  Net 192 ml     Wound 01/05/24 1700 Pressure Injury Heel Left Deep Tissue Pressure Injury - Purple or maroon localized area of discolored intact skin or blood-filled blister due to damage of underlying soft tissue from pressure and/or shear. (Active)     Wound 01/05/24 1700 Pressure Injury Heel Right Deep Tissue Pressure Injury - Purple or maroon localized area of discolored intact skin or blood-filled blister due to damage of underlying soft tissue from pressure and/or shear. (Active)    Physical Exam: Vital Signs Blood pressure (!) 142/62, pulse 61, temperature 97.9 F (36.6 C), temperature source Oral, resp. rate 15, height 5' 3.5 (1.613 m), weight 60.2 kg, SpO2 100%.  Constitutional: No distress . Vital signs reviewed. Awake, alert, appropriate.  HEENT: NCAT, EOMI, oral membranes moist. Blind in L eye.  Neck: supple Cardiovascular: RRR without m/r/g appreciated. No JVD    Respiratory/Chest: CTA Bilaterally without wheezes or rales. Normal effort    GI/Abdomen: soft, +BS throughout, non-tender, non-distended Ext: no clubbing, cyanosis, or edema Psych: pleasant and cooperative Skin: dry,  warm Musculoskeletal:     Cervical back: Neck supple.     Comments: Ue's 5-/5 except FA 4/5 on R and 4+/5 on L RLE- HF 2/5 limited due to pain; otherwise KE/KF 3+/5; and DF 4/5 and PF 4+/5 LLE- HF 3-/5; otherwise the same in KE/KF/DF/PF  as RLE, stable 11/17   TTP around R hip- no incision  Skin:    General: Skin is warm and dry.     Comments: R forearm IV- looks OK Skin dry- flaking on LE's  Neurological:     Mental Status: She is alert and oriented to person, place, and time.     Comments: Intact to light touch in LE's- except feet decreased- from her chronic neuropathy  Psychiatric:        Mood and Affect: Mood normal.        Behavior: Behavior normal.     Comments: A little flat- appears frustrated with bad food   Assessment/Plan: 1. Functional deficits which require 3+ hours per day of interdisciplinary therapy in a comprehensive inpatient rehab setting. Physiatrist is providing close team supervision and 24 hour management of active medical problems listed below. Physiatrist and rehab team continue to assess barriers  to discharge/monitor patient progress toward functional and medical goals  Care Tool:  Bathing    Body parts bathed by patient: Right arm, Left arm, Face, Chest, Abdomen, Front perineal area, Buttocks, Right upper leg, Left upper leg, Right lower leg, Left lower leg   Body parts bathed by helper: Front perineal area, Buttocks     Bathing assist Assist Level: Minimal Assistance - Patient > 75%     Upper Body Dressing/Undressing Upper body dressing   What is the patient wearing?: Pull over shirt    Upper body assist Assist Level: Set up assist    Lower Body Dressing/Undressing Lower body dressing      What is the patient wearing?: Pants     Lower body assist Assist for lower body dressing: Moderate Assistance - Patient 50 - 74%     Toileting Toileting    Toileting assist Assist for toileting: Moderate Assistance - Patient 50 - 74%      Transfers Chair/bed transfer  Transfers assist  Chair/bed transfer activity did not occur: Safety/medical concerns  Chair/bed transfer assist level: Moderate Assistance - Patient 50 - 74%     Locomotion Ambulation   Ambulation assist      Assist level: Moderate Assistance - Patient 50 - 74% Assistive device: Walker-hemi Max distance: 15   Walk 10 feet activity   Assist     Assist level: Moderate Assistance - Patient - 50 - 74% Assistive device: Walker-hemi   Walk 50 feet activity   Assist Walk 50 feet with 2 turns activity did not occur: Safety/medical concerns         Walk 150 feet activity   Assist Walk 150 feet activity did not occur: Safety/medical concerns         Walk 10 feet on uneven surface  activity   Assist Walk 10 feet on uneven surfaces activity did not occur: Safety/medical concerns         Wheelchair     Assist Is the patient using a wheelchair?: No Type of Wheelchair: Manual    Wheelchair assist level: Dependent - Patient 0% Max wheelchair distance: 150    Wheelchair 50 feet with 2 turns activity    Assist        Assist Level: Dependent - Patient 0%   Wheelchair 150 feet activity     Assist      Assist Level: Dependent - Patient 0%   Blood pressure (!) 142/62, pulse 61, temperature 97.9 F (36.6 C), temperature source Oral, resp. rate 15, height 5' 3.5 (1.613 m), weight 60.2 kg, SpO2 100%.  Medical Problem List and Plan: 1. Functional deficits secondary to Debility from  Acute on chronic CHF exacerbation after syncope with AV block/pacemaker placement             -patient may  shower- pacemaker placed 11/10             -ELOS/Goals: 14-16 days- mod I to supervision  -Continue CIR   2.  Antithrombotics: -DVT/anticoagulation:  Mechanical: Sequential compression devices, below knee Bilateral lower extremities -Pharmaceutical: Lovenox  30mg  daily start in CIR, continue             -antiplatelet  therapy: N/A -Venous Duplex pending.    3. Pain Management: continue Oxycodone , Tylenol  and lidocaine  patch prn.    4. Mood/Behavior/Sleep: LCSW to follow for evaluation and support when available.              -antipsychotic agents: n/a             -  sleep: continue melatonin 5 mg , trazodone PRN   5. Neuropsych/cognition: This patient is capable of making decisions on her own behalf.   6. Skin/Wound Care: Routine pressure relief measures. Monitor chest surgical site for infection.    7. Fluids/Electrolytes/Nutrition: Monitor I&O and weight, continue vitamins/supplements. Follow up labs CBC/CMP     8. 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   -HR stable, monitor   9. Acute/Chronic systolic CHF: hx of Nonischemic Cardiomyopathy-Holding Entresto  and SGLT2 due to AKI on CKD3B. Continue Irbesartan 300mg  daily and Torsemide 20mg  daily.  -01/06/24 Wt down but suspect might be inaccurate, monitor trend Filed Weights   01/05/24 1537 01/06/24 0601 01/07/24 0531  Weight: 62.9 kg 58.2 kg 60.2 kg      10. AKI on CKD IIIB: on Torsemide, monitor CMP. Cr 1.69 11/14>> 1.75 on 11/15, repeat BMP tomorrow   11. T2DM: uncontrolled, A1C 8.1- monitor glucose ac/hs with SSI, Lantus  20U daily resumed. Jardience to be resumed when mobility improves.   -01/06/24 CBGs ok, monitor CBG (last 3)  Recent Labs    01/06/24 2051 01/07/24 0533 01/07/24 1139  GLUCAP 164* 91 154*      12. Right Inferior Pubic Rami Fx: conservative management, weightbearing as tolerated.    13. HLD: Lipitor 40 mg    14. Constipation: Senna 8.6mg  daily and Miralax daily.  -gave Sorbitol 30ml and SSE today after admission since has been >1 week since decent BM-- successful 11/15 -01/06/24 LBM last night, monitor on current regimen  15. Hyperkalemia: Received Lokelma  x1, trend labs. K+ 5.0- will recheck labs after admission  16. Anemia-chronic: worsening, repeat Hgb tomorrow     LOS: 2 days A FACE TO FACE EVALUATION WAS PERFORMED  [name] [name] 01/07/2024, 12:48 PM

## 2024-01-07 NOTE — Progress Notes (Signed)
 Patient reports feeling worn out from therapy. Incontinent x 1 of urine, otherwise using  female urinal with assistance. . SCD's and prevalon boots applied to BLE's at HS. Foam dressings to bilateral heels. Denies pain. [name]

## 2024-01-07 NOTE — Progress Notes (Signed)
 Inpatient Rehabilitation Center Individual Statement of Services  Patient Name:  [name]  Date:  01/07/2024  Welcome to the Inpatient Rehabilitation Center.  Our goal is to provide you with an individualized program based on your diagnosis and situation, designed to meet your specific needs.  With this comprehensive rehabilitation program, you will be expected to participate in at least 3 hours of rehabilitation therapies Monday-Friday, with modified therapy programming on the weekends.  Your rehabilitation program will include the following services:  Physical Therapy (PT), Occupational Therapy (OT), Speech Therapy (ST), 24 hour per day rehabilitation nursing, Therapeutic Recreaction (TR), Neuropsychology, Care Coordinator, Rehabilitation Medicine, Nutrition Services, and Pharmacy Services  Weekly team conferences will be held on Wednesday to discuss your progress.  Your Inpatient Rehabilitation Care Coordinator will talk with you frequently to get your input and to update you on team discussions.  Team conferences with you and your family in attendance may also be held.  Expected length of stay: 14-16 days  Overall anticipated outcome: Independent with assistive device   Depending on your progress and recovery, your program may change. Your Inpatient Rehabilitation Care Coordinator will coordinate services and will keep you informed of any changes. Your Inpatient Rehabilitation Care Coordinator's name and contact numbers are listed  below.  The following services may also be recommended but are not provided by the Inpatient Rehabilitation Center:  Driving Evaluations Home Health Rehabiltiation Services Outpatient Rehabilitation Services Vocational Rehabilitation   Arrangements will be made to provide these services after discharge if needed.  Arrangements include referral to agencies that provide these services.  Your insurance has been verified to be:  MEDICARE / MEDICARE PART A AND B   Your primary doctor is:  Koirala, Dibas, MD  Pertinent information will be shared with your doctor and your insurance company.  Inpatient Rehabilitation Care Coordinator:  Di'Asia Loreli SIERRAS [phone] or ELIGAH BRINKS  Information discussed with and copy given to patient by: Waverly Loreli, 01/07/2024, 3:40 PM

## 2024-01-07 NOTE — Progress Notes (Signed)
 Inpatient Rehabilitation Care Coordinator Assessment and Plan Patient Details  Name: [name] MRN: [id number] Date of Birth: 12-Apr-1945  Today's Date: 01/07/2024  Hospital Problems: Principal Problem:   Debility Active Problems:   CHF exacerbation [facility])  Past Medical History:  Past Medical History:  Diagnosis Date   Breast cancer (HCC)    breast - left    Chronic diastolic CHF (congestive heart failure), NYHA class 2 (HCC)    a. cMRI 5/10: EF 38% // b. Echo 3/13/: EF 50-55, Gr 1 DD // c. Echo 8/14: EF 40, inf/inf-septal HK, Gr 1 DD, mild MR // d. Echo 5/17: EF 40-45, inf HK, Gr 1 DD, mild MR, mild LAE, reduced RVSF, mild TR, PASP 33 // e. Echo 10/17: EF 40-45, Gr 2 DD, mild MR, severe LAE; => 09/2020 EF 50-55%, GrII DD.   Coronary artery disease, non-occlusive    a. nonobs by LHC in 2010 // b. Myoview  10/17: Lg infarct apex, distal ant and lat walls, no ischemia, EF 38; int risk  // c. LHC 10/17:  dLAD 15, pLCx 30, mRCA 40, LVEDP 15   Diabetes mellitus    NICM (nonischemic cardiomyopathy) (HCC)    Past Surgical History:  Past Surgical History:  Procedure Laterality Date   APPENDECTOMY     BREAST LUMPECTOMY     lt breast   CARDIAC CATHETERIZATION N/A 12/20/2015   Procedure: Left Heart Cath and Coronary Angiography;  Surgeon: Lonni Hanson, MD:: Mild to moderate, nonobstructive coronary artery disease, including 15% distal LAD stenosis, 30% ulcerated mid LCx disease, and 40% mid RCA stenosis.  Upper normal left ventricular filling pressure (LVEDP 15 mmHg).   PACEMAKER IMPLANT N/A 12/31/2023   Procedure: PACEMAKER IMPLANT;  Surgeon: Almetta Donnice LABOR, MD;  Location: [facility] INVASIVE CV LAB;  Service: Cardiovascular;  Laterality: N/A;   RIGHT HEART CATH N/A 05/08/2016   Procedure: Right Heart Cath;  Surgeon: Toribio JONELLE Fuel, MD;  Location: [facility] INVASIVE CV LAB;; RA = 3, RV = 34/5, PA = 38/12 (21), PCW = 12; Fick cardiac output/index = 4.6/2.6; PVR = 1.9 WU, Ao sat = 96%; PA sat =  58%, 62%    TEMPORARY PACEMAKER Right 12/29/2023   Procedure: TEMPORARY PACEMAKER;  Surgeon: Anner Alm ORN, MD;  Location: [facility] INVASIVE CV LAB;  Service: Cardiovascular;  Laterality: Right;   TRANSTHORACIC ECHOCARDIOGRAM  11/2015   a) 11/2015: EF 40 and 45%.  GRII DD; b) 04/2016: EF 20% with grade 2 diastolic dysfunction   TRANSTHORACIC ECHOCARDIOGRAM  10/2016   a) EF 55-60%.  Normal LV function.  Mild DD.[name]  Normal valve Fxn.; b) 09/2020: EF 50 to 55%.  Normal function.  No R WMA.  Mild LVH.  GR 2 DD with elevated LAP.-Mild LA dilation.  Moderately elevated PAP estimated RVSP 47 mmHg, and RAP 8 mmHg.   TRIGGER FINGER RELEASE Left 04/23/2017   Procedure: RELEASE TRIGGER FINGER TO LEFT FORTH FINGER;  Surgeon: Marcus Lung, MD;  Location: [facility];  Service: Plastics;  Laterality: Left;   Social History:  reports that she quit smoking about 11 years ago. Her smoking use included cigarettes. She started smoking about 41 years ago. She has never used smokeless tobacco. She reports that she does not drink alcohol and does not use drugs.  Family / Support Systems Marital Status: Married Patient Roles: Spouse Children: Rosaline and John Anticipated Caregiver: Rosaline assists during the day - John assists in the evenings/nights Ability/Limitations of Caregiver: None mentioned Caregiver Availability: 24/7 Family  Dynamics: Good family support  Social History Preferred language: English Religion: Presbyterian Education: [employer] - How often do you need to have someone help you when you read instructions, pamphlets, or other written material from your doctor or pharmacy?: Rarely (Has some bouts of confusion but is able to process information slowly) Writes: Yes Employment Status: Retired   Abuse/Neglect Abuse/Neglect Assessment Can Be Completed: Yes Physical Abuse: Denies Verbal Abuse: Denies Sexual Abuse: Denies Exploitation of patient/patient's resources:  Denies Self-Neglect: Denies  Patient response to: Social Isolation - How often do you feel lonely or isolated from those around you?: Never  Emotional Status Pt's affect, behavior and adjustment status: Patient adjusting to rehab Recent Psychosocial Issues: None Psychiatric History: None Substance Abuse History: Previous smoker  Patient / Family Perceptions, Expectations & Goals Pt/Family understanding of illness & functional limitations: Patient/family uderstanding of illness & functional limitations Premorbid pt/family roles/activities: Limited activity in the community Anticipated changes in roles/activities/participation: None mentioned Pt/family expectations/goals: Has realistic expectations/goals  [employer]: None Premorbid Home Care/DME Agencies: Other (Comment) (Rollator (4 wheels), Shower seat - built in, [employer] [facility] - tub/shower, Hand held shower head) Transportation available at discharge: Yes Is the patient able to respond to transportation needs?: Yes In the past 12 months, has lack of transportation kept you from medical appointments or from getting medications?: No In the past 12 months, has lack of transportation kept you from meetings, work, or from getting things needed for daily living?: No Resource referrals recommended: Neuropsychology  Discharge Planning Living Arrangements: Children, Other relatives Support Systems: Children, Other relatives Type of Residence: Private residence Insurance Resources: [employer] (specify), Medicare Financial Resources: Family Support Financial Screen Referred: Yes Living Expenses: Own Money Management: Family, Patient Does the patient have any problems obtaining your medications?: No Home Management: Patient minimally manages home, lives with son who mostly manages home Patient/Family Preliminary Plans: Plans to return home with son and daughter providing assistance during the day while son  works Care Coordinator Anticipated Follow Up Needs: HH/OP Expected length of stay: 14-16 days  Clinical Impression CSW met with patient/family to introduce herself and complete initial assessment. Patient is able to make needs known. Patient presented to [facility] for debility. She has had 2 falls recently. She is a retired public relations account executive. She lives with her son currently - he offers assistance during the evening times and at night after work while her daughter provides care during the day. Added to the neuorpsych list for 11/2.  There were no further needs or concerns at present. CSW will follow up with family and continue to follow. Will provide patient/family with an update as soon as one becomes available.   Di'Asia  Loreli 01/07/2024, 3:41 PM

## 2024-01-07 NOTE — Progress Notes (Signed)
 VASCULAR LAB    Bilateral lower extremity venous duplex has been performed.  See CV proc for preliminary results.   Kassidi Elza, RVT 01/07/2024, 5:05 PM

## 2024-01-08 LAB — CBC WITH DIFFERENTIAL/PLATELET
Abs Immature Granulocytes: 0.01 K/uL (ref 0.00–0.07)
Basophils Absolute: 0.1 K/uL (ref 0.0–0.1)
Basophils Relative: 1 %
Eosinophils Absolute: 0.2 K/uL (ref 0.0–0.5)
Eosinophils Relative: 4 %
HCT: 27.8 % — ABNORMAL LOW (ref 36.0–46.0)
Hemoglobin: 9.3 g/dL — ABNORMAL LOW (ref 12.0–15.0)
Immature Granulocytes: 0 %
Lymphocytes Relative: 30 %
Lymphs Abs: 1.5 K/uL (ref 0.7–4.0)
MCH: 31.2 pg (ref 26.0–34.0)
MCHC: 33.5 g/dL (ref 30.0–36.0)
MCV: 93.3 fL (ref 80.0–100.0)
Monocytes Absolute: 0.6 K/uL (ref 0.1–1.0)
Monocytes Relative: 13 %
Neutro Abs: 2.6 K/uL (ref 1.7–7.7)
Neutrophils Relative %: 52 %
Platelets: 231 K/uL (ref 150–400)
RBC: 2.98 MIL/uL — ABNORMAL LOW (ref 3.87–5.11)
RDW: 13.4 % (ref 11.5–15.5)
WBC: 5.1 K/uL (ref 4.0–10.5)
nRBC: 0 % (ref 0.0–0.2)

## 2024-01-08 LAB — BASIC METABOLIC PANEL WITH GFR
Anion gap: 10 (ref 5–15)
BUN: 50 mg/dL — ABNORMAL HIGH (ref 8–23)
CO2: 23 mmol/L (ref 22–32)
Calcium: 8.9 mg/dL (ref 8.9–10.3)
Chloride: 106 mmol/L (ref 98–111)
Creatinine, Ser: 1.75 mg/dL — ABNORMAL HIGH (ref 0.44–1.00)
GFR, Estimated: 29 mL/min — ABNORMAL LOW (ref >60)
Glucose, Bld: 72 mg/dL (ref 70–99)
Potassium: 4.9 mmol/L (ref 3.5–5.1)
Sodium: 139 mmol/L (ref 135–145)

## 2024-01-08 MED ORDER — MELATONIN 3 MG PO TABS
3.0000 mg | ORAL_TABLET | Freq: Every day | ORAL | Status: DC
  Administered 2024-01-08 – 2024-01-14 (×7): 3 mg via ORAL
  Filled 2024-01-08 (×7): qty 1

## 2024-01-08 MED ORDER — MAGNESIUM CHLORIDE 64 MG PO TBEC
1.0000 | DELAYED_RELEASE_TABLET | Freq: Every day | ORAL | Status: DC
  Administered 2024-01-08 – 2024-01-11 (×4): 64 mg via ORAL
  Filled 2024-01-08 (×4): qty 1

## 2024-01-08 NOTE — Progress Notes (Signed)
 PROGRESS NOTE   Subjective/Complaints: Complains that she is on too many medications, reviewed medications with her, d/c oxycodone  since she is not having pain, d/c vitamin C supplement  ROS: as per HPI. Denies CP, SOB, abd pain, N/V/D/C, or any other complaints at this time.    Objective:   VAS US  LOWER EXTREMITY VENOUS (DVT) Result Date: 01/08/2024  Lower Venous DVT Study Patient Name:  [name]  Date of Exam:   01/07/2024 Medical Rec #: [id number]     Accession #:    [id number] Date of Birth: 1945/10/05     Patient Gender: F Patient Age:   78 years Exam Location:  [facility] Procedure:      VAS US  LOWER EXTREMITY VENOUS (DVT) Referring Phys: DAPHNE SATTERFIELD --------------------------------------------------------------------------------  Indications: Immobility, patient in rehabilitation status post right inferior pubic rami fracture without displacement after a syncopal episode. Status post pacemaker placement.  Limitations: Edema, pain with compression. Comparison Study: Prior negative bilateral LEV 09/20/20 Performing Technologist: Alberta Lis RVS  Examination Guidelines: A complete evaluation includes B-mode imaging, spectral Doppler, color Doppler, and power Doppler as needed of all accessible portions of each vessel. Bilateral testing is considered an integral part of a complete examination. Limited examinations for reoccurring indications may be performed as noted. The reflux portion of the exam is performed with the patient in reverse Trendelenburg.  +---------+---------------+---------+-----------+----------+--------------+ RIGHT    CompressibilityPhasicitySpontaneityPropertiesThrombus Aging +---------+---------------+---------+-----------+----------+--------------+ CFV      Full           Yes      Yes                                 +---------+---------------+---------+-----------+----------+--------------+  SFJ      Full                                                        +---------+---------------+---------+-----------+----------+--------------+ FV Prox  Full           Yes      Yes                                 +---------+---------------+---------+-----------+----------+--------------+ FV Mid   Full           Yes      Yes                                 +---------+---------------+---------+-----------+----------+--------------+ FV DistalFull           Yes      Yes                                 +---------+---------------+---------+-----------+----------+--------------+ PFV      Full           Yes  Yes                                 +---------+---------------+---------+-----------+----------+--------------+ POP      Full           Yes      Yes                                 +---------+---------------+---------+-----------+----------+--------------+ PTV      Full                                                        +---------+---------------+---------+-----------+----------+--------------+ PERO     Full                                                        +---------+---------------+---------+-----------+----------+--------------+   +---------+---------------+---------+-----------+----------+--------------+ LEFT     CompressibilityPhasicitySpontaneityPropertiesThrombus Aging +---------+---------------+---------+-----------+----------+--------------+ CFV      Full           Yes      Yes                                 +---------+---------------+---------+-----------+----------+--------------+ SFJ      Full                                                        +---------+---------------+---------+-----------+----------+--------------+ FV Prox  Full                                                        +---------+---------------+---------+-----------+----------+--------------+ FV Mid   Full           Yes      Yes                                  +---------+---------------+---------+-----------+----------+--------------+ FV DistalFull           Yes      Yes                                 +---------+---------------+---------+-----------+----------+--------------+ PFV      Full                                                        +---------+---------------+---------+-----------+----------+--------------+ POP      Full           Yes  Yes                                 +---------+---------------+---------+-----------+----------+--------------+ PTV      Full                                                        +---------+---------------+---------+-----------+----------+--------------+ PERO     Full                                                        +---------+---------------+---------+-----------+----------+--------------+     Summary: RIGHT: - There is no evidence of deep vein thrombosis in the lower extremity.  - No cystic structure found in the popliteal fossa.  LEFT: - There is no evidence of deep vein thrombosis in the lower extremity.  - No cystic structure found in the popliteal fossa.  *See table(s) above for measurements and observations. Electronically signed by Debby Robertson on 01/08/2024 at 9:58:40 AM.    Final     Recent Labs    01/07/24 0446 01/08/24 0515  WBC 5.2 5.1  HGB 9.4* 9.3*  HCT 28.2* 27.8*  PLT 215 231   Recent Labs    01/07/24 0446 01/08/24 0515  NA 138 139  K 4.4 4.9  CL 104 106  CO2 24 23  GLUCOSE 95 72  BUN 42* 50*  CREATININE 1.81* 1.75*  CALCIUM  8.8* 8.9        Intake/Output Summary (Last 24 hours) at 01/08/2024 1152 Last data filed at 01/08/2024 0816 Gross per 24 hour  Intake 708 ml  Output 350 ml  Net 358 ml     Wound 01/05/24 1700 Pressure Injury Heel Left Deep Tissue Pressure Injury - Purple or maroon localized area of discolored intact skin or blood-filled blister due to damage of underlying soft tissue from pressure  and/or shear. (Active)     Wound 01/05/24 1700 Pressure Injury Heel Right Deep Tissue Pressure Injury - Purple or maroon localized area of discolored intact skin or blood-filled blister due to damage of underlying soft tissue from pressure and/or shear. (Active)    Physical Exam: Vital Signs Blood pressure 113/60, pulse 65, temperature 97.8 F (36.6 C), temperature source Oral, resp. rate 16, height 5' 3.5 (1.613 m), weight 62.6 kg, SpO2 100%.  Constitutional: No distress . Vital signs reviewed. Awake, alert, appropriate.  HEENT: NCAT, EOMI, oral membranes moist. Blind in L eye.  Neck: supple Cardiovascular: RRR without m/r/g appreciated. No JVD    Respiratory/Chest: CTA Bilaterally without wheezes or rales. Normal effort    GI/Abdomen: soft, +BS throughout, non-tender, non-distended Ext: no clubbing, cyanosis, or edema Psych: pleasant and cooperative Skin: dry, warm Musculoskeletal:     Cervical back: Neck supple.     Comments: Ue's 5-/5 except FA 4/5 on R and 4+/5 on L RLE- HF 2/5 limited due to pain; otherwise KE/KF 3+/5; and DF 4/5 and PF 4+/5 LLE- HF 3-/5; otherwise the same in KE/KF/DF/PF  as RLE, stable 11/18   TTP around R hip- no incision  Skin:    General: Skin is warm and dry.  Comments: R forearm IV- looks OK Skin dry- flaking on LE's  Neurological:     Mental Status: She is alert and oriented to person, place, and time.     Comments: Intact to light touch in LE's- except feet decreased- from her chronic neuropathy  Psychiatric:        Mood and Affect: Mood normal.        Behavior: Behavior normal.     Comments: A little flat- appears frustrated with bad food   Assessment/Plan: 1. Functional deficits which require 3+ hours per day of interdisciplinary therapy in a comprehensive inpatient rehab setting. Physiatrist is providing close team supervision and 24 hour management of active medical problems listed below. Physiatrist and rehab team continue to assess  barriers to discharge/monitor patient progress toward functional and medical goals  Care Tool:  Bathing    Body parts bathed by patient: Right arm, Left arm, Face, Chest, Abdomen, Front perineal area, Buttocks, Right upper leg, Left upper leg, Right lower leg, Left lower leg   Body parts bathed by helper: Front perineal area, Buttocks     Bathing assist Assist Level: Minimal Assistance - Patient > 75%     Upper Body Dressing/Undressing Upper body dressing   What is the patient wearing?: Pull over shirt    Upper body assist Assist Level: Set up assist    Lower Body Dressing/Undressing Lower body dressing      What is the patient wearing?: Pants     Lower body assist Assist for lower body dressing: Moderate Assistance - Patient 50 - 74%     Toileting Toileting    Toileting assist Assist for toileting: Moderate Assistance - Patient 50 - 74%     Transfers Chair/bed transfer  Transfers assist  Chair/bed transfer activity did not occur: Safety/medical concerns  Chair/bed transfer assist level: Minimal Assistance - Patient > 75%     Locomotion Ambulation   Ambulation assist      Assist level: Minimal Assistance - Patient > 75% Assistive device: Walker-hemi Max distance: 66ft   Walk 10 feet activity   Assist     Assist level: Minimal Assistance - Patient > 75% Assistive device: Walker-hemi   Walk 50 feet activity   Assist Walk 50 feet with 2 turns activity did not occur: Safety/medical concerns         Walk 150 feet activity   Assist Walk 150 feet activity did not occur: Safety/medical concerns         Walk 10 feet on uneven surface  activity   Assist Walk 10 feet on uneven surfaces activity did not occur: Safety/medical concerns         Wheelchair     Assist Is the patient using a wheelchair?: No Type of Wheelchair: Manual    Wheelchair assist level: Dependent - Patient 0% Max wheelchair distance: 150    Wheelchair 50  feet with 2 turns activity    Assist        Assist Level: Dependent - Patient 0%   Wheelchair 150 feet activity     Assist      Assist Level: Dependent - Patient 0%   Blood pressure 113/60, pulse 65, temperature 97.8 F (36.6 C), temperature source Oral, resp. rate 16, height 5' 3.5 (1.613 m), weight 62.6 kg, SpO2 100%.  Medical Problem List and Plan: 1. Functional deficits secondary to Debility from  Acute on chronic CHF exacerbation after syncope with AV block/pacemaker placement             -  patient may  shower- pacemaker placed 11/10             -ELOS/Goals: 14-16 days- mod I to supervision  -Continue CIR   2.  Antithrombotics: -DVT/anticoagulation:  Mechanical: Sequential compression devices, below knee Bilateral lower extremities -Pharmaceutical: Lovenox  30mg  daily start in CIR, continue             -antiplatelet therapy: N/A -Venous Duplex pending.    3. Pain Management: continue Oxycodone , Tylenol  and lidocaine  patch prn.    4. Mood/Behavior/Sleep: LCSW to follow for evaluation and support when available.              -antipsychotic agents: n/a             -sleep: continue melatonin 5 mg , trazodone PRN   5. Neuropsych/cognition: This patient is capable of making decisions on her own behalf.   6. Skin/Wound Care: Routine pressure relief measures. Monitor chest surgical site for infection.    7. Fluids/Electrolytes/Nutrition: Monitor I&O and weight, continue vitamins/supplements. Follow up labs CBC/CMP     8. 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   -HR stable, monitor   9. Acute/Chronic systolic CHF: hx of Nonischemic Cardiomyopathy-Holding Entresto  and SGLT2 due to AKI on CKD3B. Continue Irbesartan 300mg  daily and Torsemide 20mg  daily.  -01/06/24 Wt down but suspect might be inaccurate, monitor trend Filed Weights   01/06/24 0601 01/07/24 0531 01/08/24 0500  Weight: 58.2 kg 60.2 kg 62.6 kg      10. AKI on  CKD IIIB: on Torsemide, monitor CMP. Cr 1.69 11/14>> 1.75 on 11/15, repeat BMP tomorrow   11. T2DM: uncontrolled, A1C 8.1- monitor glucose ac/hs with SSI, Lantus  20U daily resumed. Jardience to be resumed when mobility improves.   -01/06/24 CBGs ok, monitor CBG (last 3)  Recent Labs    01/07/24 1139 01/07/24 1712 01/07/24 1931  GLUCAP 154* 214* 206*      12. Right Inferior Pubic Rami Fx: conservative management, weightbearing as tolerated.    13. HLD: Lipitor 40 mg    14. Constipation: continue Senna 8.6mg  daily.  LBM 11/15- messaged nursing to confirm accuracy, d/c miralax as per #17. Magnesium  supplement added to help with muscle cramps and should also help constipation  15. Hyperkalemia: potassium reviewed and has normalized  16. Anemia-chronic: worsening, asked nursing to please obtain stool occult  17. Polypharmacy: d/c oxycodone , vitamin C supplement, miralax    LOS: 3 days A FACE TO FACE EVALUATION WAS PERFORMED  Waunita Sandstrom P Sundus Pete 01/08/2024, 11:52 AM

## 2024-01-08 NOTE — IPOC Note (Signed)
 Overall Plan of Care [facility]) Patient Details Name: [name] MRN: [id number] DOB: 1946-01-17  Admitting Diagnosis: Debility  Hospital Problems: Principal Problem:   Debility Active Problems:   CHF exacerbation (HCC)     Functional Problem List: Nursing Bladder, Bowel, Pain, Edema, Endurance, Medication Management, Safety  PT Balance, Endurance, Motor  OT Balance, Sensory, Vision, Endurance, Motor, Pain, Safety  SLP    TR         Basic ADL's: OT Grooming, Bathing, Dressing, Toileting     Advanced  ADL's: OT       Transfers: PT Bed Mobility, Bed to Chair, Car  OT Toilet     Locomotion: PT Ambulation, Stairs     Additional Impairments: OT    SLP        TR      Anticipated Outcomes Item Anticipated Outcome  Self Feeding mod I  Swallowing      Basic self-care  mod I to SUP  Toileting  mod I   Bathroom Transfers SUP  Bowel/Bladder  manage bowel w mod I assist and bladder w toileting  Transfers  Mod I transfers  Locomotion  Mod I for gait, S for stairs  Communication     Cognition     Pain  Pain < 4 with prns  Safety/Judgment  manage safety w cues   Therapy Plan: PT Intensity: Minimum of 1-2 x/day ,45 to 90 minutes PT Frequency: 5 out of 7 days PT Duration Estimated Length of Stay: 14 to 16 days OT Intensity: Minimum of 1-2 x/day, 45 to 90 minutes OT Frequency: 5 out of 7 days OT Duration/Estimated Length of Stay: 12-16     Team Interventions: Nursing Interventions Disease Management/Prevention, Patient/Family Education, Bladder Management, Pain Management, Medication Management, Bowel Management, Discharge Planning  PT interventions Ambulation/gait training, Balance/vestibular training, Community reintegration, Discharge planning, Neuromuscular re-education, Functional mobility training, DME/adaptive equipment instruction, Pain management, Patient/family education, Splinting/orthotics, UE/LE Coordination activities, UE/LE Strength  taining/ROM, Therapeutic Exercise, Therapeutic Activities, Wheelchair propulsion/positioning, Stair training  OT Interventions Warden/ranger, Disease mangement/prevention, Neuromuscular re-education, Therapeutic Activities, Visual/perceptual remediation/compensation, Psychosocial support, Functional mobility training, Discharge planning, UE/LE Coordination activities, Patient/family education, Community reintegration, UE/LE Strength taining/ROM, Pain management, Skin care/wound managment, DME/adaptive equipment instruction, Cognitive remediation/compensation, Wheelchair propulsion/positioning, Therapeutic Exercise, Self Care/advanced ADL retraining  SLP Interventions    TR Interventions    SW/CM Interventions Psychosocial Support, Discharge Planning, Patient/Family Education, Disease Management/Prevention   Barriers to Discharge MD  Medical stability  Nursing Decreased caregiver support, Home environment access/layout, Weight bearing restrictions lives with son, alone during the day, does not drive, manages meds with set up and meals without assist  PT Inaccessible home environment door sill to enter home  OT Weight bearing restrictions    SLP      SW       Team Discharge Planning: Destination: PT-Home ,OT- Home , SLP-  Projected Follow-up: PT-Home health PT, OT-  Home health OT, SLP-  Projected Equipment Needs: PT-To be determined, OT- 3 in 1 bedside comode, SLP-  Equipment Details: PT- , OT-  Patient/family involved in discharge planning: PT- Patient,  OT-Patient, SLP-   MD ELOS: 14-16 Medical Rehab Prognosis:  Excellent Assessment: The patient has been admitted for CIR therapies with the diagnosis of Debility from Acute on chronic CHF exacerbation after syncope with AV block/pacemaker placement . The team will be addressing functional mobility, strength, stamina, balance, safety, adaptive techniques and equipment, self-care, bowel and bladder mgt, patient and caregiver  education. Goals have  been set at mod I. Anticipated discharge destination is home.        See Team Conference Notes for weekly updates to the plan of care

## 2024-01-08 NOTE — Plan of Care (Signed)
  Problem: Consults Goal: RH GENERAL PATIENT EDUCATION Description: See Patient Education module for education specifics. Outcome: Progressing Goal: Skin Care Protocol Initiated - if Braden Score 18 or less Description: If consults are not indicated, leave blank or document N/A Outcome: Progressing Goal: Diabetes Guidelines if Diabetic/Glucose > 140 Description: If diabetic or lab glucose is > 140 mg/dl - Initiate Diabetes/Hyperglycemia Guidelines & Document Interventions  Outcome: Progressing   Problem: RH BOWEL ELIMINATION Goal: RH STG MANAGE BOWEL WITH ASSISTANCE Description: STG Manage Bowel with mod I Assistance. Outcome: Progressing Goal: RH STG MANAGE BOWEL W/MEDICATION W/ASSISTANCE Description: STG Manage Bowel with Medication with Mod I Assistance. Outcome: Progressing   Problem: RH BLADDER ELIMINATION Goal: RH STG MANAGE BLADDER WITH ASSISTANCE Description: STG Manage Bladder With Toileting min Assistance Outcome: Progressing   Problem: RH SKIN INTEGRITY Goal: RH STG SKIN FREE OF INFECTION/BREAKDOWN Description: Manage with min assist  Outcome: Progressing Goal: RH STG MAINTAIN SKIN INTEGRITY WITH ASSISTANCE Description: STG Maintain Skin Integrity With min Assistance. Outcome: Progressing Goal: RH STG ABLE TO PERFORM INCISION/WOUND CARE W/ASSISTANCE Description: STG Able To Perform Incision/Wound Care With min Assistance. Outcome: Progressing   Problem: RH SAFETY Goal: RH STG ADHERE TO SAFETY PRECAUTIONS W/ASSISTANCE/DEVICE Description: STG Adhere to Safety Precautions With Assistance/Device. Outcome: Progressing   Problem: RH PAIN MANAGEMENT Goal: RH STG PAIN MANAGED AT OR BELOW PT'S PAIN GOAL Outcome: Progressing   Problem: RH KNOWLEDGE DEFICIT GENERAL Goal: RH STG INCREASE KNOWLEDGE OF SELF CARE AFTER HOSPITALIZATION Description: Patient and family will be able to manage care at discharge using educational resources for medications skin care and dietary  modifications independently Outcome: Progressing

## 2024-01-08 NOTE — Progress Notes (Signed)
 Patient ID: AYSSA BENTIVEGNA, female   DOB: 30-Mar-1945, 78 y.o.   MRN: [id number] Met with the patient and dtr to review current medical situation, rehab process, team conference and plan of care. Discussed secondary risk management including DM (A1C 8.1), HTN, PAD and HF(BNP 2498.8). Reviewed CMM/HH diet, Zone tool and daily weight checks with dietary modification recommendations.  Reviewed medications and insulin  administration; patient was using a continuous glucose monitor. Also not NWB left UE and WBAT right LE with pubic rami fracture and recent pacer placement. Continue to follow along to address educational needs to facilitate preparation for discharge. Fredericka Barnie NOVAK

## 2024-01-08 NOTE — Progress Notes (Signed)
 Occupational Therapy Session Note  Patient Details  Name: [name] MRN: [id number] Date of Birth: [birth year]  Today's Date: 01/08/2024 OT Individual Time: 1300-1417 OT Individual Time Calculation (min): 77 min    Short Term Goals: Week 1:  OT Short Term Goal 1 (Week 1): patient will complete LB dressing min A with LRAD OT Short Term Goal 2 (Week 1): patient will complete functional toilet transfer with min A OT Short Term Goal 3 (Week 1): patient will tolerate standing 45 seconds with o2 within precaution range and good balance to increase ability to complete standing ADL tasks  Skilled Therapeutic Interventions/Progress Updates:  Skilled OT session completed to address ADL retraining. Pt received supine in bed, agreeable to participate in therapy. Pt reports no pain.  OT and pt collaborate on best method for toileting in room, pt reporting d/t urgency can not always complete functional mobility into bathroom. OT places BSC in room to increase independence with toileting and reduce incontinence episodes. Supine>EOB with supervision, VC to limit use of LUE during transfers d/t pacemaker precautions. Pt required VC for the remainder of session to discontinue tasks when using LUE. Pt verbalized understanding of pacemaker precautions. Pt completed functional mobility CGA with hemi walker to bathroom to complete shower level ADLs. Pt doffed UB clothing with supervision, VC to adhere to precautions. Pt doffed LB clothing with Min A to remove pants at BLE. Pt completed bathing seated at TTB with CGA, standing intermittently during task. Pt reports increased fatigue following shower, OT provided Mod A to dry off BLE and bottom. Pt completed functional mobility to [facility] with hemi walker, seated in WC OT dependently propels pt to bed. Pt completed UB dressing with set-up A adhering to precautions. LB dressing with Min A to weave in BLE. EOB>Supine with Min A to lift LLE. Pt supine in bed with bed alarm on  and all needs within reach.   Therapy Documentation Precautions:  Precautions Precautions: ICD/Pacemaker Recall of Precautions/Restrictions: Intact Precaution/Restrictions Comments: pacemaker Restrictions Weight Bearing Restrictions Per Provider Order: Yes LUE Weight Bearing Per Provider Order: Non weight bearing RLE Weight Bearing Per Provider Order: Weight bearing as tolerated Other Position/Activity Restrictions: LUE limited wt bearing and ROM post PPM  Other Treatments:     Therapy/Group: Individual Therapy  [name], MS, OTR/L 01/08/2024, 8:17 AM

## 2024-01-08 NOTE — Progress Notes (Signed)
 Occupational Therapy Session Note  Patient Details  Name: AFNAN CADIENTE MRN: [id number] Date of Birth: [birth year]  Today's Date: 01/08/2024 OT Individual Time: 9084-8986 OT Individual Time Calculation (min): 58 min    Short Term Goals: Week 1:  OT Short Term Goal 1 (Week 1): patient will complete LB dressing min A with LRAD OT Short Term Goal 2 (Week 1): patient will complete functional toilet transfer with min A OT Short Term Goal 3 (Week 1): patient will tolerate standing 45 seconds with o2 within precaution range and good balance to increase ability to complete standing ADL tasks  Skilled Therapeutic Interventions/Progress Updates: Patient seen for seated endurance building exercises. Patient agreeable to OT treatment but reports LE fatigue after PT session. Assisted patient to the therapy gym for R grasp strengthening and functional reach tasks at table top. Followed with RUE 3 x 10 reps using 1# hand weight for shoulder, biceps, and triceps exercises. Patient reports she has weights at home and several exercises she enjoys doing. Continue treatment working on patient's needs for return to home. Patient would like to be able to use the [facility] at the bed side at night due to urgency and concern with frequency due to CHF medication. Patient reports feeling good about her progress towards independence using AE/DME but is limited in an unfamiliar setting due to vision loss. Continue with skilled OT POC to improve safety and independence with self care skills.      Therapy Documentation Precautions:  Precautions Precautions: ICD/Pacemaker Recall of Precautions/Restrictions: Intact Precaution/Restrictions Comments: pacemaker Restrictions Weight Bearing Restrictions Per Provider Order: Yes LUE Weight Bearing Per Provider Order: Non weight bearing RLE Weight Bearing Per Provider Order: Weight bearing as tolerated Other Position/Activity Restrictions: LUE limited wt bearing and ROM post  PPM General:   Vital Signs:   Pain: Pain Assessment Pain Scale: 0-10 Pain Score: 6  Pain Location: Back Pain Intervention(s): Medication (See eMAR)   Therapy/Group: Individual Therapy  Isaiah JONETTA Freund 01/08/2024, 12:32 PM

## 2024-01-08 NOTE — Progress Notes (Signed)
 Physical Therapy Session Note  Patient Details  Name: [name] MRN: [id number] Date of Birth: 1945-05-17  Today's Date: 01/08/2024 PT Individual Time: 0730-0840 PT Individual Time Calculation (min): 70 min   Short Term Goals: Week 1:  PT Short Term Goal 1 (Week 1): Pt will increase bed mobility to contact gaurd. PT Short Term Goal 2 (Week 1): Pt will increase sit to stand transfers with LRAD to contact gaurd. PT Short Term Goal 3 (Week 1): Pt will increase transfers with LRAD to min A PT Short Term Goal 4 (Week 1): Pt will ascend/descend 1 step with hemiwalker and mod A. PT Short Term Goal 5 (Week 1): Pt will ambulate about 50 feet with LRAD and min A.  Skilled Therapeutic Interventions/Progress Updates:   Received pt semi-reclined in bed, pt agreeable to PT treatment, and denied any pain during session. Session with emphasis on functional mobility/transfers, dressing, generalized strengthening and endurance, dynamic standing balance/coordination, and ambulation. Pt requesting therapist inspect her heels (difficulty seeing). Removed dressings at heels and no pressure sores noted. Re-dressed heels and wrote sign and placed above [facility] stating that pt has difficulty seeing. Pt transferred semi-reclined<>sitting L EOB with HOB elevated and close supervision. Donned pants with max A and performed all transfers with hemi walker and min A throughout session. Pt required min A to pull pants over hips and sat in WC at sink and washed face/brushed teeth with setup assist. Donned shoes with max A and transported to/from room in [facility] dependently for time management purposes.   Pt ambulated 53ft x 1 and 74ft x 1 with hemi walker and min A. Pt ambulates at decreased cadence with flexed trunk/downward gaze, decreased foot clearance, and narrow BOS - cues for correction and stepping sequence with hemi walker. Pt limited by dizziness that improved with seated rest break. Pt performed seated BLE strengthening on  Kinetron at 20 cm/sec for 1 minute x 2 trials with emphasis on glute/quad strength. On trial 2, pt required 2 rest breaks to complete 1 minute interval. Returned to room and concluded session with pt sitting in WC, needs within reach, and seatbelt alarm on. Nutrition services present to take meal order.   Therapy Documentation Precautions:  Precautions Precautions: ICD/Pacemaker Recall of Precautions/Restrictions: Intact Precaution/Restrictions Comments: pacemaker Restrictions Weight Bearing Restrictions Per Provider Order: Yes LUE Weight Bearing Per Provider Order: Non weight bearing RLE Weight Bearing Per Provider Order: Weight bearing as tolerated Other Position/Activity Restrictions: LUE limited wt bearing and ROM post PPM  Therapy/Group: Individual Therapy Therisa HERO Zaunegger Therisa Stains PT, DPT 01/08/2024, 7:03 AM

## 2024-01-08 NOTE — Progress Notes (Signed)
 Writer attempted to complete dressing change to peripheral IV site but was unsuccessful in completing task d/t pt refusal stating  they are sup[pose to take that out. Despite clinical research associate educating pt on the importance of changing IV site dressing as ordered pt continues to refuse to allow writer to complete dressing change.

## 2024-01-09 MED ORDER — INSULIN GLARGINE-YFGN 100 UNIT/ML ~~LOC~~ SOLN
22.0000 [IU] | Freq: Every day | SUBCUTANEOUS | Status: DC
  Administered 2024-01-10 – 2024-01-11 (×2): 22 [IU] via SUBCUTANEOUS
  Filled 2024-01-09 (×2): qty 0.22

## 2024-01-09 MED ORDER — SORBITOL 70 % SOLN
30.0000 mL | Freq: Once | Status: AC
  Administered 2024-01-09: 30 mL via ORAL
  Filled 2024-01-09: qty 30

## 2024-01-09 NOTE — Plan of Care (Signed)
  Problem: Consults Goal: RH GENERAL PATIENT EDUCATION Description: See Patient Education module for education specifics. Outcome: Progressing Goal: Skin Care Protocol Initiated - if Braden Score 18 or less Description: If consults are not indicated, leave blank or document N/A Outcome: Progressing Goal: Diabetes Guidelines if Diabetic/Glucose > 140 Description: If diabetic or lab glucose is > 140 mg/dl - Initiate Diabetes/Hyperglycemia Guidelines & Document Interventions  Outcome: Progressing   Problem: RH BOWEL ELIMINATION Goal: RH STG MANAGE BOWEL WITH ASSISTANCE Description: STG Manage Bowel with mod I Assistance. Outcome: Progressing Goal: RH STG MANAGE BOWEL W/MEDICATION W/ASSISTANCE Description: STG Manage Bowel with Medication with Mod I Assistance. Outcome: Progressing   Problem: RH BLADDER ELIMINATION Goal: RH STG MANAGE BLADDER WITH ASSISTANCE Description: STG Manage Bladder With Toileting min Assistance Outcome: Progressing   Problem: RH SKIN INTEGRITY Goal: RH STG SKIN FREE OF INFECTION/BREAKDOWN Description: Manage with min assist  Outcome: Progressing Goal: RH STG MAINTAIN SKIN INTEGRITY WITH ASSISTANCE Description: STG Maintain Skin Integrity With min Assistance. Outcome: Progressing Goal: RH STG ABLE TO PERFORM INCISION/WOUND CARE W/ASSISTANCE Description: STG Able To Perform Incision/Wound Care With min Assistance. Outcome: Progressing   Problem: RH SAFETY Goal: RH STG ADHERE TO SAFETY PRECAUTIONS W/ASSISTANCE/DEVICE Description: STG Adhere to Safety Precautions With Assistance/Device. Outcome: Progressing   Problem: RH PAIN MANAGEMENT Goal: RH STG PAIN MANAGED AT OR BELOW PT'S PAIN GOAL Outcome: Progressing   Problem: RH KNOWLEDGE DEFICIT GENERAL Goal: RH STG INCREASE KNOWLEDGE OF SELF CARE AFTER HOSPITALIZATION Description: Patient and family will be able to manage care at discharge using educational resources for medications skin care and dietary  modifications independently Outcome: Progressing

## 2024-01-09 NOTE — Progress Notes (Signed)
 Patient ID: [name], female   DOB: 07-29-1945, 78 y.o.   MRN: [id number]  Have reviewed team conference with pt and family. Both aware and agreeable with targeted d/c date of 11/26 and goals of Independent with assistive device.   Will reinstate HH PT/OT services with [facility].

## 2024-01-09 NOTE — Progress Notes (Signed)
 PROGRESS NOTE   Subjective/Complaints: No new complaints this morning CBGs continue to be elevated, increased insulin  Working well with therapy  ROS: as per HPI. Denies CP, SOB, abd pain +constipation  Objective:   VAS US  LOWER EXTREMITY VENOUS (DVT) Result Date: 01/08/2024  Lower Venous DVT Study Patient Name:  [name]  Date of Exam:   01/07/2024 Medical Rec #: [id number]     Accession #:    [id number] Date of Birth: 06/13/1945     Patient Gender: F Patient Age:   78 years Exam Location:  [facility] Procedure:      VAS US  LOWER EXTREMITY VENOUS (DVT) Referring Phys: DAPHNE SATTERFIELD --------------------------------------------------------------------------------  Indications: Immobility, patient in rehabilitation status post right inferior pubic rami fracture without displacement after a syncopal episode. Status post pacemaker placement.  Limitations: Edema, pain with compression. Comparison Study: Prior negative bilateral LEV 09/20/20 Performing Technologist: Alberta Lis RVS  Examination Guidelines: A complete evaluation includes B-mode imaging, spectral Doppler, color Doppler, and power Doppler as needed of all accessible portions of each vessel. Bilateral testing is considered an integral part of a complete examination. Limited examinations for reoccurring indications may be performed as noted. The reflux portion of the exam is performed with the patient in reverse Trendelenburg.  +---------+---------------+---------+-----------+----------+--------------+ RIGHT    CompressibilityPhasicitySpontaneityPropertiesThrombus Aging +---------+---------------+---------+-----------+----------+--------------+ CFV      Full           Yes      Yes                                 +---------+---------------+---------+-----------+----------+--------------+ SFJ      Full                                                         +---------+---------------+---------+-----------+----------+--------------+ FV Prox  Full           Yes      Yes                                 +---------+---------------+---------+-----------+----------+--------------+ FV Mid   Full           Yes      Yes                                 +---------+---------------+---------+-----------+----------+--------------+ FV DistalFull           Yes      Yes                                 +---------+---------------+---------+-----------+----------+--------------+ PFV      Full           Yes      Yes                                 +---------+---------------+---------+-----------+----------+--------------+  POP      Full           Yes      Yes                                 +---------+---------------+---------+-----------+----------+--------------+ PTV      Full                                                        +---------+---------------+---------+-----------+----------+--------------+ PERO     Full                                                        +---------+---------------+---------+-----------+----------+--------------+   +---------+---------------+---------+-----------+----------+--------------+ LEFT     CompressibilityPhasicitySpontaneityPropertiesThrombus Aging +---------+---------------+---------+-----------+----------+--------------+ CFV      Full           Yes      Yes                                 +---------+---------------+---------+-----------+----------+--------------+ SFJ      Full                                                        +---------+---------------+---------+-----------+----------+--------------+ FV Prox  Full                                                        +---------+---------------+---------+-----------+----------+--------------+ FV Mid   Full           Yes      Yes                                  +---------+---------------+---------+-----------+----------+--------------+ FV DistalFull           Yes      Yes                                 +---------+---------------+---------+-----------+----------+--------------+ PFV      Full                                                        +---------+---------------+---------+-----------+----------+--------------+ POP      Full           Yes      Yes                                 +---------+---------------+---------+-----------+----------+--------------+  PTV      Full                                                        +---------+---------------+---------+-----------+----------+--------------+ PERO     Full                                                        +---------+---------------+---------+-----------+----------+--------------+     Summary: RIGHT: - There is no evidence of deep vein thrombosis in the lower extremity.  - No cystic structure found in the popliteal fossa.  LEFT: - There is no evidence of deep vein thrombosis in the lower extremity.  - No cystic structure found in the popliteal fossa.  *See table(s) above for measurements and observations. Electronically signed by Debby Robertson on 01/08/2024 at 9:58:40 AM.    Final     Recent Labs    01/07/24 0446 01/08/24 0515  WBC 5.2 5.1  HGB 9.4* 9.3*  HCT 28.2* 27.8*  PLT 215 231   Recent Labs    01/07/24 0446 01/08/24 0515  NA 138 139  K 4.4 4.9  CL 104 106  CO2 24 23  GLUCOSE 95 72  BUN 42* 50*  CREATININE 1.81* 1.75*  CALCIUM  8.8* 8.9        Intake/Output Summary (Last 24 hours) at 01/09/2024 1036 Last data filed at 01/08/2024 1321 Gross per 24 hour  Intake 237 ml  Output --  Net 237 ml     Wound 01/05/24 1700 Pressure Injury Heel Left Deep Tissue Pressure Injury - Purple or maroon localized area of discolored intact skin or blood-filled blister due to damage of underlying soft tissue from pressure and/or shear. (Active)      Wound 01/05/24 1700 Pressure Injury Heel Right Deep Tissue Pressure Injury - Purple or maroon localized area of discolored intact skin or blood-filled blister due to damage of underlying soft tissue from pressure and/or shear. (Active)    Physical Exam: Vital Signs Blood pressure 128/65, pulse 61, temperature 97.9 F (36.6 C), temperature source Oral, resp. rate 17, height 5' 3.5 (1.613 m), weight 59.4 kg, SpO2 100%.  Constitutional: No distress . Vital signs reviewed. Awake, alert, appropriate.  HEENT: NCAT, EOMI, oral membranes moist. Blind in L eye.  Neck: supple Cardiovascular: RRR without m/r/g appreciated. No JVD    Respiratory/Chest: CTA Bilaterally without wheezes or rales. Normal effort    GI/Abdomen: soft, +BS throughout, non-tender, non-distended Ext: no clubbing, cyanosis, or edema Psych: pleasant and cooperative Skin: dry, warm Musculoskeletal:     Cervical back: Neck supple.     Comments: Ue's 5-/5 except FA 4/5 on R and 4+/5 on L RLE- HF 3/5 limited due to pain; otherwise KE/KF 3+/5; and DF 4/5 and PF 4+/5 LLE- HF 3-/5; otherwise the same in KE/KF/DF/PF  as RLE, improved as above 11/19   TTP around R hip- no incision  Skin:    General: Skin is warm and dry.     Comments: R forearm IV- looks OK Skin dry- flaking on LE's  Neurological:     Mental Status: She is alert and oriented to person, place, and time.  Comments: Intact to light touch in LE's- except feet decreased- from her chronic neuropathy  Psychiatric:        Mood and Affect: Mood normal.        Behavior: Behavior normal.     Comments: A little flat- appears frustrated with bad food   Assessment/Plan: 1. Functional deficits which require 3+ hours per day of interdisciplinary therapy in a comprehensive inpatient rehab setting. Physiatrist is providing close team supervision and 24 hour management of active medical problems listed below. Physiatrist and rehab team continue to assess barriers to  discharge/monitor patient progress toward functional and medical goals  Care Tool:  Bathing    Body parts bathed by patient: Right arm, Left arm, Face, Chest, Abdomen, Front perineal area, Buttocks, Right upper leg, Left upper leg, Right lower leg, Left lower leg   Body parts bathed by helper: Front perineal area, Buttocks     Bathing assist Assist Level: Minimal Assistance - Patient > 75%     Upper Body Dressing/Undressing Upper body dressing   What is the patient wearing?: Pull over shirt    Upper body assist Assist Level: Set up assist    Lower Body Dressing/Undressing Lower body dressing      What is the patient wearing?: Pants     Lower body assist Assist for lower body dressing: Moderate Assistance - Patient 50 - 74%     Toileting Toileting    Toileting assist Assist for toileting: Moderate Assistance - Patient 50 - 74%     Transfers Chair/bed transfer  Transfers assist  Chair/bed transfer activity did not occur: Safety/medical concerns  Chair/bed transfer assist level: Minimal Assistance - Patient > 75%     Locomotion Ambulation   Ambulation assist      Assist level: Minimal Assistance - Patient > 75% Assistive device: Rollator Max distance: 40ft   Walk 10 feet activity   Assist     Assist level: Minimal Assistance - Patient > 75% Assistive device: Rollator   Walk 50 feet activity   Assist Walk 50 feet with 2 turns activity did not occur: Safety/medical concerns  Assist level: Minimal Assistance - Patient > 75% Assistive device: Rollator    Walk 150 feet activity   Assist Walk 150 feet activity did not occur: Safety/medical concerns         Walk 10 feet on uneven surface  activity   Assist Walk 10 feet on uneven surfaces activity did not occur: Safety/medical concerns         Wheelchair     Assist Is the patient using a wheelchair?: No Type of Wheelchair: Manual    Wheelchair assist level: Dependent -  Patient 0% Max wheelchair distance: 150    Wheelchair 50 feet with 2 turns activity    Assist        Assist Level: Dependent - Patient 0%   Wheelchair 150 feet activity     Assist      Assist Level: Dependent - Patient 0%   Blood pressure 128/65, pulse 61, temperature 97.9 F (36.6 C), temperature source Oral, resp. rate 17, height 5' 3.5 (1.613 m), weight 59.4 kg, SpO2 100%.  Medical Problem List and Plan: 1. Functional deficits secondary to Debility from  Acute on chronic CHF exacerbation after syncope with AV block/pacemaker placement             -patient may  shower- pacemaker placed 11/10             -ELOS/Goals: 14-16 days-  mod I to supervision  -Continue CIR  Team conference today   2.  Antithrombotics: -DVT/anticoagulation:  Mechanical: Sequential compression devices, below knee Bilateral lower extremities -Pharmaceutical: Lovenox  30mg  daily start in CIR, continue             -antiplatelet therapy: N/A -Venous Duplex pending.    3. Pain Management: continue Oxycodone , Tylenol  and lidocaine  patch prn.    4. Mood/Behavior/Sleep: LCSW to follow for evaluation and support when available.              -antipsychotic agents: n/a             -sleep: continue melatonin 5 mg , trazodone PRN   5. Neuropsych/cognition: This patient is capable of making decisions on her own behalf.   6. Skin/Wound Care: Routine pressure relief measures. Monitor chest surgical site for infection.    7. Fluids/Electrolytes/Nutrition: Monitor I&O and weight, continue vitamins/supplements. Follow up labs CBC/CMP     8. 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   -HR stable, monitor   9. Acute/Chronic systolic CHF: hx of Nonischemic Cardiomyopathy-Holding Entresto  and SGLT2 due to AKI on CKD3B. Continue Irbesartan 300mg  daily and Torsemide 20mg  daily.  Weight reviewed and is decreased Filed Weights   01/07/24 0531 01/08/24 0500 01/09/24 0500   Weight: 60.2 kg 62.6 kg 59.4 kg      10. AKI on CKD IIIB: on Torsemide, monitor CMP. Cr 1.69 11/14>> 1.75 on 11/15, repeat BMP tomorrow   11. T2DM: uncontrolled, A1C 8.1- monitor glucose ac/hs with SSI, increase Lantus  to 21U. Jardience to be resumed when mobility improves.   Recent Labs    01/07/24 1139 01/07/24 1712 01/07/24 1931  GLUCAP 154* 214* 206*      12. Right Inferior Pubic Rami Fx: conservative management, weightbearing as tolerated.    13. HLD: continue Lipitor 40 mg    14. Constipation: continue Senna 8.6mg  daily.  LBM 11/15- messaged nursing to confirm accuracy, d/c miralax as per #17. Magnesium  supplement added to help with muscle cramps and should also help constipation, check magnesium  level tomorrow morning  15. Hyperkalemia: potassium reviewed and has normalized  16. Anemia-chronic: worsening, asked nursing to please obtain stool occult  17. Polypharmacy: d/c oxycodone , vitamin C supplement, miralax    LOS: 4 days A FACE TO FACE EVALUATION WAS PERFORMED  Sven P Georgeana Oertel 01/09/2024, 10:36 AM

## 2024-01-09 NOTE — Patient Care Conference (Signed)
 Inpatient RehabilitationTeam Conference and Plan of Care Update Date: 01/09/2024   Time: 11:44 AM    Patient Name: [name]      Medical Record Number: [id number]  Date of Birth: 01-29-1946 Sex: Female         Room/Bed: 4W25C/4W25C-01 Payor Info: Payor: MEDICARE / Plan: MEDICARE PART A AND B / Product Type: *No Product type* /    Admit Date/Time:  01/05/2024  3:26 PM  Primary Diagnosis:  Debility  Hospital Problems: Principal Problem:   Debility Active Problems:   CHF exacerbation [facility])    Expected Discharge Date: Expected Discharge Date: 01/16/24  Team Members Present: Physician leading conference: Dr. Sven Elks Social Worker Present: Waverly Gentry, LCSW-A Nurse Present: Barnie Ronde, RN PT Present: Therisa Stains, PT OT Present: Vera Pop, OT SLP Present: Rosina Downy, SLP PPS Coordinator present : Eleanor Colon, SLP     Current Status/Progress Goal Weekly Team Focus  Bowel/Bladder      Continent of bowel and bladder          Swallow/Nutrition/ Hydration               ADL's   Patient's functional transfers at [facility] assist, but need Mod/Max assist with LE dressing   Supervision   Patient would like to be able to get up to [facility] with supervision only during the night.    Mobility   bed mobility min A, transfers with hemi walker mod A, gait 42ft with hemi walker mod/max A +2 for WC follow   Mod I/supervision  barriers: pain, weakness/deconditioning, decreased balance/coordination, poor adherance to LUE NWB precautions    Communication                Safety/Cognition/ Behavioral Observations               Pain     N/A           Skin      DTI on bilateral heels   DTI healing, no other skin issues noted    Prevalon boots and foam pads in place    Discharge Planning:  Will discharge home with son and daughter providing care during the day and night. Will await therapy follow-up recommendations   Team Discussion: Patient  admitted post fall with non surgical right pubic rami fracture and bradycardia; pacemaker placement. Functional progress limited by muscle cramps, poor adherence to pacemaker placement precautions and pain with general deconditioning.  Patient on target to meet rehab goals: yes, currently needs supervision for bed mobility. Needs min assist for lower body care and toileting on a BSC. Needs CGA for transfers using a hemi-walker.  Able to ambulate up to 17' using a RW with CGA.    *See Care Plan and progress notes for long and short-term goals.   Revisions to Treatment Plan:  Dexcom monitor for CBGs Magnesium  supplement added per MD   Teaching Needs: Safety, medications, dietary modifications, transfers, toileting, post pacer precautions, etc.   Current Barriers to Discharge: Decreased caregiver support and Home enviroment access/layout  Possible Resolutions to Barriers: Family education HH follow up services Cathie) DME:BSC, 3N1     Medical Summary Current Status: CKD, muslce cramps, HLD, HTN, type 2 diabetes  Barriers to Discharge: Medical stability  Barriers to Discharge Comments: CKD, muscle cramps, HLD, HTN, type 2 diabetes Possible Resolutions to [employer]: continue to monitor Cr, magnesium  supplement started, continue statin, continue coreg , increase insulin , continue ISS   Continued Need for Acute Rehabilitation  Level of Care: The patient requires daily medical management by a physician with specialized training in physical medicine and rehabilitation for the following reasons: Direction of a multidisciplinary physical rehabilitation program to maximize functional independence : Yes Medical management of patient stability for increased activity during participation in an intensive rehabilitation regime.: Yes Analysis of laboratory values and/or radiology reports with any subsequent need for medication adjustment and/or medical intervention. : Yes   I attest  that I was present, lead the team conference, and concur with the assessment and plan of the team.   Fredericka Sober B 01/09/2024, 2:09 PM

## 2024-01-09 NOTE — Progress Notes (Signed)
 Occupational Therapy Note  Patient Details  Name: [name] MRN: [id number] Date of Birth: 26-Aug-1945   Occupational Therapist participated in the interdisciplinary team conference, providing clinical information regarding the patient's current status, treatment goals, and weekly focus, including any barriers that need to be addressed. Please see the Inpatient Rehabilitation Team Conference and Plan of Care Update for further details.   Tearah Saulsbury Woods-Chance, MS, OTR/L 01/09/2024, 5:33 PM

## 2024-01-09 NOTE — Progress Notes (Signed)
 Physical Therapy Note  Patient Details  Name: MILO SOLANA MRN: [id number] Date of Birth: 06/29/1945 Today's Date: 01/09/2024   Physical Therapist participated in the interdisciplinary team conference, providing clinical information regarding the patient's current status, treatment goals, and weekly focus, including any barriers that need to be addressed. Please see the Inpatient Rehabilitation Team Conference and Plan of Care Update for further details.    Therisa HERO Zaunegger Therisa Stains PT, DPT 01/09/2024, 11:42 AM

## 2024-01-09 NOTE — Progress Notes (Signed)
 Occupational Therapy Session Note  Patient Details  Name: ECHO PROPP MRN: [id number] Date of Birth: 1945-03-10  Session 1 Today's Date: 01/09/2024 OT Individual Time: 9081-8984 OT Individual Time Calculation (min): 57 min  Session 2 Today's Date: 01/09/2024 OT Individual Time: 1450-1534 OT Individual Time Calculation (min): 44 min    Short Term Goals: Week 1:  OT Short Term Goal 1 (Week 1): patient will complete LB dressing min A with LRAD OT Short Term Goal 2 (Week 1): patient will complete functional toilet transfer with min A OT Short Term Goal 3 (Week 1): patient will tolerate standing 45 seconds with o2 within precaution range and good balance to increase ability to complete standing ADL tasks  Skilled Therapeutic Interventions/Progress Updates:  Session 1: Skilled OT session completed to address LB dressing and home management. Pt received seated in WC, agreeable to participate in therapy. Pt reports no pain.  OT dependently propelled pt to main gym. Pt completed LB dressing simulation with yellow theraband with CGA, standing intermittently at rollator during task. OT provided VC on rollator safety to ensure it is locked prior to sitting and when stopped during functional mobility. Pt verbalized understanding. Pt completed functional mobility ~19ft to ADL apt with CGA, OT dependently locked brakes when sitting at rollator to reduce risk for falls. OT instructed pt on functional mobility in kitchen and item retrieval with rollator with CGA, VC throughout task when stopping to open cabinets/doors to lock rollator to reduce risk for falls. Pt reports challenge with navigating home with rollator, will continue in following OT sessions. Pt reports d/t low vision at baseline she does not currently cook without supervision and son manages medication set-up daily. OT provides education on modifying tasks to ensure bright lighting is available to A with vision. Pt verbalized understanding and  reports increasing task lighting A with independence. Pt dependently propelled back to room seated in [facility] with chair alarm on and all needs within reach.  Session 2: Skilled OT session completed to address dynamic standing balance and safety awareness. Pt received supine, agreeable to participate in therapy. Pt reports no pain.  Pt dependently propelled to ortho gym. STS at rollator completed with CGA, VC to limit pulling/pushing on LUE d/t pacemaker precautions. OT provides education on importance of adhering to precautions when completing functional activity and to limit use of LUE for standing. Pt verbalized understanding. Pt completed removal of clips from posterior region in standing to increase independence with toileting hygiene. Pt removed 10/10 clips with CGA standing at rollator, throughout task pt has no BUE support, no LOB noted. Pt instructed on 2 trials to remove squigz from mirror in standing. Trial 1: Pt removed 10/10 squigz from mirror at shoulder and waist height with CGA. Trial 2: task upgraded with overhead reaching and below waist reaching to simulate clothing retrieval in home environment. Pt required VC to sit at rollator to retrieve items at floor height to reduce risk for falls. Pt attempts to bear weight at LLE when retrieving squigz during trial 2, OT provides VC for safety. Pt dependently propelled back to room. WC>EOB stand pivot with supervision. EOB>Supine with CGA,VC to adhere to precautions. Pt supine in bed with bed alarm on and all needs within reach direct handoff to NT.  Therapy Documentation Precautions:  Precautions Precautions: ICD/Pacemaker Recall of Precautions/Restrictions: Intact Precaution/Restrictions Comments: pacemaker Restrictions Weight Bearing Restrictions Per Provider Order: Yes LUE Weight Bearing Per Provider Order: Non weight bearing RLE Weight Bearing Per Provider Order: Weight bearing  as tolerated Other Position/Activity Restrictions: LUE limited  wt bearing and ROM post PPM    Therapy/Group: Individual Therapy  Cornelis Kluver Woods-Chance, MS, OTR/L 01/09/2024, 7:48 AM

## 2024-01-09 NOTE — Progress Notes (Signed)
 Physical Therapy Session Note  Patient Details  Name: [name] MRN: [id number] Date of Birth: 03/05/1945  Today's Date: 01/09/2024 PT Individual Time: 0732-0825 and 1300-1340 PT Individual Time Calculation (min): 53 min and 40 min  Short Term Goals: Week 1:  PT Short Term Goal 1 (Week 1): Pt will increase bed mobility to contact gaurd. PT Short Term Goal 2 (Week 1): Pt will increase sit to stand transfers with LRAD to contact gaurd. PT Short Term Goal 3 (Week 1): Pt will increase transfers with LRAD to min A PT Short Term Goal 4 (Week 1): Pt will ascend/descend 1 step with hemiwalker and mod A. PT Short Term Goal 5 (Week 1): Pt will ambulate about 50 feet with LRAD and min A.  Skilled Therapeutic Interventions/Progress Updates:   Treatment Session 1 Received pt semi-reclined in bed, pt agreeable to PT treatment, and denied any pain during session. Pt reported not receiving breakfast - therapist located breakfast tray and pt agreed to get OOB to eat. Session with emphasis on functional mobility/transfers, generalized strengthening and endurance, dynamic standing balance/coordination, and ambulation. Pt transferred semi-reclined<>sitting L EOB with HOB elevated and use of bedrails with supervision - cues to avoid pushing with LUE. Donned pants and shoes with max A. Pt performed all transfers with hemi walker and CGA and able to pull pants over hips with CGA for balance.   Pt sat in [facility] and ate breakfast with setup assist from therapist due to visual deficits. While eating, discussed team conference today. Pt verbalized feeling ready to go home beginning of next week - will discuss with treatment team. Pt also reported having rollator at home (hasn't used it) and wishes to use that upon discharge. Discussed how to safely use rollator without placing too much weight through LUE and using it for balance only. Located rollator and educated pt on rollator safety including brake management and  importance of backing rollator against stable surface prior to sitting.   Pt performed remainder of transfers with rollator and CGA/min A. Pt ambulated 76ft x 2 trials with rollator and CGA/light min A.Pt limited by fatigue and soreness in legs and requested to work on seated exercises. Performed the following seated exercises with emphasis on LE strength/ROM: -alternating marches 2x12 bilaterally -LAQ 2x12 bilaterally -hip adduction pillow squeezes 2x10 RN arrived to administer medication and concluded session with pt sitting in WC, needs witin reach, and seatbelt alarm on.   Treatment Session 2 Received pt sitting in WC, pt agreeable to PT treatment, and reported pain 5/10 in low back (premedicated). Pt reported fatigue from previous therapies. Session with emphasis on functional mobility/transfers, generalized strengthening and endurance, dynamic standing balance/coordination, and ambulation. Provided pt with heat packs for low back and transported to/from room in [facility] dependently for energy conservation purposes.   Pt performed seated BLE strengthening on Kinetron at 20 cm/sec for 1 minute x 3 trials with emphasis on glute/quad strength for active warm up. Pt required rest break on 3rd trial due to fatigue. Pt stood with rollator and CGA and ambulated 149ft x 1 and 22ft x 1 with rollator and CGA. Pt required cues for rollator safety and for brake management when sitting/standing. Pt limited by fatigue and soreness in BLE. Returned to room and concluded session with pt sitting in WC, needs within reach, and seatbelt alarm on.   Therapy Documentation Precautions:  Precautions Precautions: ICD/Pacemaker Recall of Precautions/Restrictions: Intact Precaution/Restrictions Comments: pacemaker Restrictions Weight Bearing Restrictions Per Provider Order: Yes LUE Weight  Bearing Per Provider Order: Non weight bearing RLE Weight Bearing Per Provider Order: Weight bearing as tolerated Other  Position/Activity Restrictions: LUE limited wt bearing and ROM post PPM  Therapy/Group: Individual Therapy Therisa HERO Zaunegger Therisa Stains PT, DPT 01/09/2024, 6:54 AM

## 2024-01-10 ENCOUNTER — Telehealth: Payer: Self-pay

## 2024-01-10 ENCOUNTER — Ambulatory Visit

## 2024-01-10 LAB — BASIC METABOLIC PANEL WITH GFR
Anion gap: 8 (ref 5–15)
BUN: 50 mg/dL — ABNORMAL HIGH (ref 8–23)
CO2: 25 mmol/L (ref 22–32)
Calcium: 8.8 mg/dL — ABNORMAL LOW (ref 8.9–10.3)
Chloride: 104 mmol/L (ref 98–111)
Creatinine, Ser: 1.76 mg/dL — ABNORMAL HIGH (ref 0.44–1.00)
GFR, Estimated: 29 mL/min — ABNORMAL LOW (ref >60)
Glucose, Bld: 172 mg/dL — ABNORMAL HIGH (ref 70–99)
Potassium: 4.1 mmol/L (ref 3.5–5.1)
Sodium: 137 mmol/L (ref 135–145)

## 2024-01-10 LAB — CBC
HCT: 28.1 % — ABNORMAL LOW (ref 36.0–46.0)
Hemoglobin: 9.3 g/dL — ABNORMAL LOW (ref 12.0–15.0)
MCH: 31 pg (ref 26.0–34.0)
MCHC: 33.1 g/dL (ref 30.0–36.0)
MCV: 93.7 fL (ref 80.0–100.0)
Platelets: 258 K/uL (ref 150–400)
RBC: 3 MIL/uL — ABNORMAL LOW (ref 3.87–5.11)
RDW: 13.5 % (ref 11.5–15.5)
WBC: 4.9 K/uL (ref 4.0–10.5)
nRBC: 0 % (ref 0.0–0.2)

## 2024-01-10 LAB — GLUCOSE, CAPILLARY: Glucose-Capillary: 151 mg/dL — ABNORMAL HIGH (ref 70–99)

## 2024-01-10 LAB — MAGNESIUM: Magnesium: 2.2 mg/dL (ref 1.7–2.4)

## 2024-01-10 MED ORDER — PROCHLORPERAZINE 25 MG RE SUPP: 12.5000 mg | Freq: Four times a day (QID) | RECTAL | Status: DC | PRN

## 2024-01-10 MED ORDER — PROCHLORPERAZINE MALEATE 5 MG PO TABS: 5.0000 mg | ORAL_TABLET | Freq: Three times a day (TID) | ORAL | Status: DC | PRN

## 2024-01-10 MED ORDER — PROCHLORPERAZINE EDISYLATE 10 MG/2ML IJ SOLN: 5.0000 mg | Freq: Four times a day (QID) | INTRAMUSCULAR | Status: DC | PRN

## 2024-01-10 NOTE — Progress Notes (Signed)
 Occupational Therapy Session Note  Patient Details  Name: [name] MRN: [id number] Date of Birth: November 18, 1945  Session 1 Today's Date: 01/10/2024 OT Individual Time: 8892-8794 OT Individual Time Calculation (min): 58 min  Session 2 Today's Date: 01/10/2024 OT Individual Time: 8567-8467 OT Individual Time Calculation (min): 60 min   Short Term Goals: Week 1:  OT Short Term Goal 1 (Week 1): patient will complete LB dressing min A with LRAD OT Short Term Goal 2 (Week 1): patient will complete functional toilet transfer with min A OT Short Term Goal 3 (Week 1): patient will tolerate standing 45 seconds with o2 within precaution range and good balance to increase ability to complete standing ADL tasks  Skilled Therapeutic Interventions/Progress Updates:  Session 1: Skilled OT session completed to address functional mobility and safety awareness. Pt received seated in WC, agreeable to participate in therapy. Pt reports no pain.  OT prompts pt to recall safety awareness needs when using rollator for functional mobility, pt able to recall limiting use of LUE and making sure rollator is locked before standing/sitting; however, pt unsure how to lock brakes. OT provides VC and tactile cues to orient pt with rollator brakes. Pt completed functional mobility 100 ft+50 ft with rollator CGA to avoid obstacles d/t low vision. Pt required seated rest break during functional mobility, VC to lock brakes prior to sitting. Pt requires VC to limit use of LUE during task d/t pacemake precautions. Pt returned back to room with CGA,  2 standing rest breaks, SOB noted during task. OT provided education on importance of rest breaks to conserve energy and reduce risk for falls, pt verbalized understanding. Pt returned to room seated in recliner with chair alarm on and all needs within reach .  Session 2: Skilled OT session completed to address ADL retraining and functional mobility with emphasis on safety awareness.  Pt received seated in recliner, agreeable to participate in therapy. Pt reports no pain.   Pt requested to void, respectfully declined use of bedside commode as pt wants to complete functional mobility to bathroom to simulate home environment. Pt completed functional mobility to bathroom with rollator requiring supervision, VC to lock rollator when standing at toilet. Pt completed toileting with supervision, standing intermittently during task to manage clothing. Pt completes peri care seated to reduce risk for falls. Pt completes functional mobility to sink with supervision, VC for rollator placement at sink when completing hand hygiene. OT prompted pt to verbalize safety awareness needs when using rollator, pt able to recall brake management and limiting use of LUE. Pt still displays difficulty navigating brakes, but problem solves well when making sure brakes are locked. Pt completed functional mobility throughout hospital with rollator requiring CGA to navigate obstacles and hospital grounds d/t low vision. Pt required 2 seated rest breaks during task, VC provided when in elevator to lock to reduce risk for falls. OT provided Touch A for managing rollator when turning to sit for balance. Pt verbalizes she does not prefer to sit in the rollator and will use it if needed, but is okay with standing rest breaks. No LOB or SOB noted during activity. OT provided cues to navigate return with low vision such as searching for colors on signs and task lighting to read maps. Pt able to locate Exit signs and well lit areas; difficulty navigating maps if they are not lit. OT provided VC to to A with returning back to room. Pt returned back to room seated in recliner, requesting for [facility]  to be placed at EOB for toileting at night displaying increased safety awareness and carryover from previous OT session. Chair alarm on with all needs in reach.  Therapy Documentation Precautions:  Precautions Precautions:  ICD/Pacemaker Recall of Precautions/Restrictions: Intact Precaution/Restrictions Comments: pacemaker Restrictions Weight Bearing Restrictions Per Provider Order: Yes LUE Weight Bearing Per Provider Order: Non weight bearing RLE Weight Bearing Per Provider Order: Weight bearing as tolerated Other Position/Activity Restrictions: LUE limited wt bearing and ROM post PPM  Therapy/Group: Individual Therapy  Susanne Baumgarner Woods-Chance, MS, OTR/L 01/10/2024, 7:53 AM

## 2024-01-10 NOTE — Telephone Encounter (Signed)
 Waiting to hear if patient can/will be seen by EP APP while hospitalized. Wound check was canceled.

## 2024-01-10 NOTE — Progress Notes (Signed)
 Physical Therapy Session Note  Patient Details  Name: [name] MRN: [id number] Date of Birth: Aug 28, 1945  Today's Date: 01/10/2024 PT Individual Time: 1030-1100 PT Individual Time Calculation (min): 30 min   Short Term Goals: Week 1:  PT Short Term Goal 1 (Week 1): Pt will increase bed mobility to contact gaurd. PT Short Term Goal 2 (Week 1): Pt will increase sit to stand transfers with LRAD to contact gaurd. PT Short Term Goal 3 (Week 1): Pt will increase transfers with LRAD to min A PT Short Term Goal 4 (Week 1): Pt will ascend/descend 1 step with hemiwalker and mod A. PT Short Term Goal 5 (Week 1): Pt will ambulate about 50 feet with LRAD and min A.  Skilled Therapeutic Interventions/Progress Updates: Pt presented in w/c agreeable to therapy. Pt states unrated pain in pelvic area, states improved from am as had significant pain then. Session focused on LE strengthening and education. Pt transported to [facility] entrance dependently for environmental change. Participated in ankle pumps, LAQ, seated hip flexion, and manually resisted hamstring pulls with 2lb weight 2 x 10. Pt tolerated well with no significant increased in pain. During rest breaks discussed methods to maintain pacemaker precautions as well as energy conservation techniques. Pt transported back to room at end of session and remained in w/c with belt alarm on, call bell within reach and needs met.      Therapy Documentation Precautions:  Precautions Precautions: ICD/Pacemaker Recall of Precautions/Restrictions: Intact Precaution/Restrictions Comments: pacemaker Restrictions Weight Bearing Restrictions Per Provider Order: Yes LUE Weight Bearing Per Provider Order: Non weight bearing RLE Weight Bearing Per Provider Order: Weight bearing as tolerated Other Position/Activity Restrictions: LUE limited wt bearing and ROM post PPM General:   Vital Signs:   Pain: Pain Assessment Pain Scale: 0-10 Pain Score: 0-No  pain    Therapy/Group: Individual Therapy  Anhad Sheeley 01/10/2024, 12:50 PM

## 2024-01-10 NOTE — Progress Notes (Signed)
 PROGRESS NOTE   Subjective/Complaints: Progressing very well with therapy Labs stable  CBG trending down, d/c ISS, using Dexcom Messaged team to see if d/c date should be advanced  ROS: as per HPI. Denies CP, SOB, abd pain +constipation  Objective:   No results found.   Recent Labs    01/08/24 0515 01/10/24 0444  WBC 5.1 4.9  HGB 9.3* 9.3*  HCT 27.8* 28.1*  PLT 231 258   Recent Labs    01/08/24 0515 01/10/24 0444  NA 139 137  K 4.9 4.1  CL 106 104  CO2 23 25  GLUCOSE 72 172*  BUN 50* 50*  CREATININE 1.75* 1.76*  CALCIUM  8.9 8.8*        Intake/Output Summary (Last 24 hours) at 01/10/2024 1047 Last data filed at 01/10/2024 0755 Gross per 24 hour  Intake 720 ml  Output --  Net 720 ml     Wound 01/05/24 1700 Pressure Injury Heel Left Deep Tissue Pressure Injury - Purple or maroon localized area of discolored intact skin or blood-filled blister due to damage of underlying soft tissue from pressure and/or shear. (Active)     Wound 01/05/24 1700 Pressure Injury Heel Right Deep Tissue Pressure Injury - Purple or maroon localized area of discolored intact skin or blood-filled blister due to damage of underlying soft tissue from pressure and/or shear. (Active)    Physical Exam: Vital Signs Blood pressure 120/66, pulse 60, temperature 98 F (36.7 C), temperature source Oral, resp. rate 17, height 5' 3.5 (1.613 m), weight 55.5 kg, SpO2 100%.  Constitutional: No distress . Vital signs reviewed. Awake, alert, appropriate.  HEENT: NCAT, EOMI, oral membranes moist. Blind in L eye.  Neck: supple Cardiovascular: RRR without m/r/g appreciated. No JVD    Respiratory/Chest: CTA Bilaterally without wheezes or rales. Normal effort    GI/Abdomen: soft, +BS throughout, non-tender, non-distended Ext: no clubbing, cyanosis, or edema Psych: pleasant and cooperative Skin: dry, warm Musculoskeletal:     Cervical back:  Neck supple.     Comments: Ue's 5-/5 except FA 4/5 on R and 4+/5 on L RLE- HF 3/5 limited due to pain; otherwise KE/KF 3+/5; and DF 4/5 and PF 4+/5 LLE- HF 3-/5; otherwise the same in KE/KF/DF/PF  as RLE, stable 11/20   TTP around R hip- no incision  Skin:    General: Skin is warm and dry.     Comments: R forearm IV- looks OK Skin dry- flaking on LE's  Neurological:     Mental Status: She is alert and oriented to person, place, and time.     Comments: Intact to light touch in LE's- except feet decreased- from her chronic neuropathy  Psychiatric:        Mood and Affect: Mood normal.       Assessment/Plan: 1. Functional deficits which require 3+ hours per day of interdisciplinary therapy in a comprehensive inpatient rehab setting. Physiatrist is providing close team supervision and 24 hour management of active medical problems listed below. Physiatrist and rehab team continue to assess barriers to discharge/monitor patient progress toward functional and medical goals  Care Tool:  Bathing    Body parts bathed by patient: Right arm,  Left arm, Face, Chest, Abdomen, Front perineal area, Buttocks, Right upper leg, Left upper leg, Right lower leg, Left lower leg   Body parts bathed by helper: Front perineal area, Buttocks     Bathing assist Assist Level: Minimal Assistance - Patient > 75%     Upper Body Dressing/Undressing Upper body dressing   What is the patient wearing?: Pull over shirt    Upper body assist Assist Level: Set up assist    Lower Body Dressing/Undressing Lower body dressing      What is the patient wearing?: Pants     Lower body assist Assist for lower body dressing: Moderate Assistance - Patient 50 - 74%     Toileting Toileting    Toileting assist Assist for toileting: Moderate Assistance - Patient 50 - 74%     Transfers Chair/bed transfer  Transfers assist  Chair/bed transfer activity did not occur: Safety/medical concerns  Chair/bed transfer  assist level: Minimal Assistance - Patient > 75%     Locomotion Ambulation   Ambulation assist      Assist level: Minimal Assistance - Patient > 75% Assistive device: Rollator Max distance: 191ft   Walk 10 feet activity   Assist     Assist level: Minimal Assistance - Patient > 75% Assistive device: Rollator   Walk 50 feet activity   Assist Walk 50 feet with 2 turns activity did not occur: Safety/medical concerns  Assist level: Minimal Assistance - Patient > 75% Assistive device: Rollator    Walk 150 feet activity   Assist Walk 150 feet activity did not occur: Safety/medical concerns         Walk 10 feet on uneven surface  activity   Assist Walk 10 feet on uneven surfaces activity did not occur: Safety/medical concerns         Wheelchair     Assist Is the patient using a wheelchair?: No Type of Wheelchair: Manual    Wheelchair assist level: Dependent - Patient 0% Max wheelchair distance: 150    Wheelchair 50 feet with 2 turns activity    Assist        Assist Level: Dependent - Patient 0%   Wheelchair 150 feet activity     Assist      Assist Level: Dependent - Patient 0%   Blood pressure 120/66, pulse 60, temperature 98 F (36.7 C), temperature source Oral, resp. rate 17, height 5' 3.5 (1.613 m), weight 55.5 kg, SpO2 100%.  Medical Problem List and Plan: 1. Functional deficits secondary to Debility from  Acute on chronic CHF exacerbation after syncope with AV block/pacemaker placement             -patient may  shower- pacemaker placed 11/10             -ELOS/Goals: 14-16 days- mod I to supervision  -Continue CIR  Team conference today   2.  Antithrombotics: -DVT/anticoagulation:  Mechanical: Sequential compression devices, below knee Bilateral lower extremities -Pharmaceutical: Lovenox  30mg  daily start in CIR, continue             -antiplatelet therapy: N/A -Venous Duplex pending.    3. Pain Management: continue  Oxycodone , Tylenol  and lidocaine  patch prn.    4. Mood/Behavior/Sleep: LCSW to follow for evaluation and support when available.              -antipsychotic agents: n/a             -sleep: continue melatonin 5 mg , trazodone PRN   5. Neuropsych/cognition:  This patient is capable of making decisions on her own behalf.   6. Skin/Wound Care: Routine pressure relief measures. Monitor chest surgical site for infection.    7. Fluids/Electrolytes/Nutrition: Monitor I&O and weight, continue vitamins/supplements. Follow up labs CBC/CMP     8. 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   -HR stable, monitor   9. Acute/Chronic systolic CHF: hx of Nonischemic Cardiomyopathy-Holding Entresto  and SGLT2 due to AKI on CKD3B. Continue Irbesartan 300mg  daily and Torsemide 20mg  daily.  Weight reviewed and is decreased Filed Weights   01/08/24 0500 01/09/24 0500 01/10/24 0500  Weight: 62.6 kg 59.4 kg 55.5 kg      10. AKI on CKD IIIB: on Torsemide, monitor CMP. Cr 1.69 11/14>> 1.75 on 11/15, repeat BMP tomorrow   11. T2DM: uncontrolled, A1C 8.1, d/c ISS, increase Lantus  to 21U. Jardience to be resumed when mobility improves.   Recent Labs    01/07/24 1139 01/07/24 1712 01/07/24 1931  GLUCAP 154* 214* 206*      12. Right Inferior Pubic Rami Fx: conservative management, weightbearing as tolerated.    13. HLD: continue Lipitor 40 mg    14. Constipation: continue Senna 8.6mg  daily.  LBM 11/19, continue magnesium  supplement  15. Hyperkalemia: potassium reviewed and has normalized  16. Anemia-chronic: stable, asked nursing to please obtain stool occult  17. Polypharmacy: d/c oxycodone , vitamin C supplement, miralax    LOS: 5 days A FACE TO FACE EVALUATION WAS PERFORMED  Sven SQUIBB Amalee Olsen 01/10/2024, 10:47 AM

## 2024-01-10 NOTE — Telephone Encounter (Signed)
 Patient scheduled for wound check appointment post PPM implant in device clinic on 01/10/2024. Patient currently in the hospital w/ a tentative discharge date of 01/16/2024 at this time.   Will send a message to our scheduling team to reschedule wound check appointment after patient is discharged from hospital.   Will continue to monitor and update accordingly.

## 2024-01-10 NOTE — Progress Notes (Signed)
 Physical Therapy Session Note  Patient Details  Name: [name] MRN: [id number] Date of Birth: [birth year]  Today's Date: 01/10/2024 PT Individual Time: 0805-0900 PT Individual Time Calculation (min): 55 min   Short Term Goals: Week 1:  PT Short Term Goal 1 (Week 1): Pt will increase bed mobility to contact gaurd. PT Short Term Goal 2 (Week 1): Pt will increase sit to stand transfers with LRAD to contact gaurd. PT Short Term Goal 3 (Week 1): Pt will increase transfers with LRAD to min A PT Short Term Goal 4 (Week 1): Pt will ascend/descend 1 step with hemiwalker and mod A. PT Short Term Goal 5 (Week 1): Pt will ambulate about 50 feet with LRAD and min A.  Skilled Therapeutic Interventions/Progress Updates:    Pt presents in room in bed, agreeable to PT. Pt reports back pain at start of session, premedicated but reports signifcant improvement in pain with mobility. Session focused on therapeutic activities to promote safety and independence with self care and transfers as well as improve tolerance to upright. Pt maintains LUE minimal weightbearing and ROM with min cues throughout session. Pt completes bed mobility with supervision and completes transfers throughout session with CGA/close supervision. Pt completes ambulatory transfer with CGA/close supervision to toilet with rollator and cues for positioning rollator and self closer to toilet prior to managing clothing. Pt able to manage clothing in standing with supervision, doffs pants and brief once seated with supervision. Pt incontinent in brief however does demonstrate continence while seated on toilet, charted. Pt comes to standing to wipe with cues, maintains standing and min cues to wipe with RUE to limit LUE ROM for 5 minutes, min assist from therapist for thoroughness. Therapist dons new brief and pants with max assist. Pt able to change shirt while standing with min assist demonstrating and verbalizing good adherence to LUE ROM  precautions. Pt comes to sitting on rollator for donning pants. Pt then ambualtes with rollator to [facility] in room with CGA, comes to sitting and completes hand hygiene while seated in WC. Pt transported to day room via WC dependently for time management. Pt completes ambulation with rollator 2x180' with CGA progress to close supervision demonstrating light/minimal weightbearing LUE with use of rollator no noted antalgic gait, pt able to maintain conversation with both gait trials. Pt returns to room and remains seated in [facility] with all needs within reach, cal light in place and chair alarm donned and activated at end of session.   Therapy Documentation Precautions:  Precautions Precautions: ICD/Pacemaker Recall of Precautions/Restrictions: Intact Precaution/Restrictions Comments: pacemaker Restrictions Weight Bearing Restrictions Per Provider Order: Yes LUE Weight Bearing Per Provider Order: Non weight bearing RLE Weight Bearing Per Provider Order: Weight bearing as tolerated Other Position/Activity Restrictions: LUE limited wt bearing and ROM post PPM    Therapy/Group: Individual Therapy  Reche Ohara PT, DPT 01/10/2024, 12:40 PM

## 2024-01-11 ENCOUNTER — Ambulatory Visit (HOSPITAL_COMMUNITY)

## 2024-01-11 DIAGNOSIS — F54 Psychological and behavioral factors associated with disorders or diseases classified elsewhere: Secondary | ICD-10-CM

## 2024-01-11 LAB — GLUCOSE, CAPILLARY
Glucose-Capillary: 81 mg/dL (ref 70–99)
Glucose-Capillary: 151 mg/dL — ABNORMAL HIGH (ref 70–99)

## 2024-01-11 MED ORDER — INSULIN GLARGINE-YFGN 100 UNIT/ML ~~LOC~~ SOLN
21.0000 [IU] | Freq: Every day | SUBCUTANEOUS | Status: DC
  Administered 2024-01-12 – 2024-01-14 (×3): 21 [IU] via SUBCUTANEOUS
  Filled 2024-01-11 (×4): qty 0.21

## 2024-01-11 MED ORDER — MAGNESIUM OXIDE -MG SUPPLEMENT 400 (240 MG) MG PO TABS
200.0000 mg | ORAL_TABLET | Freq: Every day | ORAL | Status: DC
  Administered 2024-01-11 – 2024-01-15 (×5): 200 mg via ORAL
  Filled 2024-01-11 (×5): qty 1

## 2024-01-11 NOTE — Progress Notes (Signed)
 Occupational Therapy Session Note  Patient Details  Name: [name] MRN: [id number] Date of Birth: 1946/01/09  Today's Date: 01/11/2024 OT Individual Time: 9096-9068 OT Individual Time Calculation (min): 28 min    Short Term Goals: Week 1:  OT Short Term Goal 1 (Week 1): patient will complete LB dressing min A with LRAD OT Short Term Goal 2 (Week 1): patient will complete functional toilet transfer with min A OT Short Term Goal 3 (Week 1): patient will tolerate standing 45 seconds with o2 within precaution range and good balance to increase ability to complete standing ADL tasks  Skilled Therapeutic Interventions/Progress Updates:  Skilled OT session completed to address functional mobility and safety awareness with pacemaker precautions. Pt received supine with NT present, agreeable to participate in therapy. Pt reports no pain.  Pt respectfully declined self-care needs this day; however, reporting difficulty with managing bandages on RLE. OT collaborated with RN to remove bandages, OT cleared to complete task. OT completed skin check on R heel, no skin integrity issues noted at this time. OT donned new bandages, pt reporting comfort. Supine>EOB with supervision, VC for NWB on LUE. OT prompted pt to recall precautions, pt verbalized precautions correctly; however, continues to need verbal reminders during sessions. Pt donned footwear with set-up A. Pt completed functional mobility 150ft+150ft with rollator requiring supervision during task to adhere to precautions. Pt required 1 seated and 1 standing rest break during task. Pt sits at rollator seat with Min A to manage brakes. OT provided education on safety awareness during rollator management, pt verbalized understanding. Pt returned to room seated in recliner with chair alarm on and all needs within reach.  Therapy Documentation Precautions:  Precautions Precautions: ICD/Pacemaker Recall of Precautions/Restrictions:  Intact Precaution/Restrictions Comments: pacemaker Restrictions Weight Bearing Restrictions Per Provider Order: Yes LUE Weight Bearing Per Provider Order: Non weight bearing RLE Weight Bearing Per Provider Order: Weight bearing as tolerated Other Position/Activity Restrictions: LUE limited wt bearing and ROM post PPM   Therapy/Group: Individual Therapy  Andjela Wickes Woods-Chance, MS, OTR/L 01/11/2024, 7:54 AM

## 2024-01-11 NOTE — Consult Note (Signed)
 Neuropsychological Consultation Comprehensive Inpatient Rehab   Patient:   [name]   DOB:   12-Oct-1945  MR Number:  [id number]  Location:  [facility] [address] CENTER [address] [place] [address] Dept: [phone] Loc: [phone]           Date of Service:   01/11/2024  Start Time:   8 AM End Time:   9 AM  Provider/Observer:  Norleen Asa, Psy.D.       Clinical Neuropsychologist       Billing Code/Service: [phone]  Reason for Service:    MARTRICE APT is an 78 year old right-handed female admitted to the comprehensive inpatient rehabilitation program and referred for neuropsychological consultation during her admission due to coping and adjustment issues with extended hospital stay following recovery from issues associated with congestive heart failure and recent traumatic injury after fall.  Reason for Consult: Neuropsychological evaluation to assess cognitive and emotional coping following a syncopal episode and subsequent debility.  History of Present Illness: The patient is [name], an 78 year old female with a history of chronic systolic heart failure, CAD, CKD3B, HTN, HLD, type 2 DM, and prior breast cancer. She presented to [facility] on 12/28/2023 via EMS after a witnessed syncopal episode at home, resulting in a fall against a refrigerator. The patient reported increased dyspnea on exertion, right hip pain, nausea, vomiting, and diarrhea. EMS noted bradycardia in the 30s and administered atropine. A CT of the head was unremarkable. A CT of the pelvis revealed a non-displaced right inferior pubic rami fracture. Labs were significant for AKI (creatinine 2.56 from baseline 1.7), elevated BNP (2500), and elevated troponins (120-130). An EKG showed a 2:1 AV block. Carvedilol  was held. She was admitted to the ICU for continued bradycardia, underwent temporary pacer placement on 12/29/2023, and permanent  pacemaker placement on 12/31/2023. Her CHF was managed with medication adjustments. Orthopedics recommended conservative management for the pelvic fracture with weight-bearing as tolerated. Prior to this event, she was independent, using a rolling walker intermittently, and living with her son. Due to significant deconditioning and debility, with moderate to maximal assistance required for mobility and ADLs, she was admitted to the inpatient rehabilitation unit.  Patient reports pre-syncopal symptoms of profound fatigue and weakness, noting she had to rest after walking to her mailbox. She recalls her daughter called EMS after she expressed feeling very weak. She does not recall the fall itself and reports her knowledge of the event is based on what others have told her. She was informed by family that her heart rate was 30 beats per minute upon EMS arrival. She has no memory of the syncopal event and was counseled that attempting to recall it is unnecessary.  Relevant Medical History: - Wet macular degeneration, previously treated. Reports vision is improving, able to see images, colors, and shapes but not fine detail. Hopes to be able to get glasses again to read. - Chronic systolic heart failure, now with permanent pacemaker. - Coronary artery disease. - Chronic kidney disease stage 3B. - Hypertension. - Hyperlipidemia. - Type 2 Diabetes Mellitus (A1c 8.4). - History of breast cancer, status post lumpectomy, radiation, and chemotherapy. - History of tobacco use.  Behavioral Observations: Patient was alert, oriented, and cooperative throughout the session. Mood appeared euthymic. Affect was appropriate to content. She engaged well in conversation, demonstrating an understanding of her current situation and the goals of rehabilitation. She expressed motivation to participate in therapies.  Impression: This is an 78 year old female recovering from a syncopal event secondary to severe bradycardia,  resulting in a non-displaced right inferior pubic rami fracture and significant debility. The primary focus of this admission is intensive rehabilitation to improve strength, mobility, and functional independence to facilitate a safe discharge home. The patient demonstrates an appropriate understanding of her medical situation and the rationale for her admission to the rehabilitation unit. She appears motivated to engage in therapy.  Plan/Recommendations: - Continue with intensive PT/OT/SLP as scheduled. - The patient was educated on the concept of debility and the importance of active participation in rehabilitation to regain strength and prevent rehospitalization. - Emphasized that this is the beginning of her recovery process and continued effort after discharge will be critical for sustained improvement. - Advised to perform activities that are challenging but safe, aiming for consistency day-to-day rather than overexertion. - Discussed the plan for discharge to home, with a goal of being home for Thanksgiving. - Outpatient therapy will be arranged upon discharge to continue progress. The patient was informed that services can be arranged at a facility convenient for her. - The patient verbalized understanding and agreement with the plan to continue working hard in therapy and after discharge to maximize her functional recovery.   Electronically Signed   _______________________ Norleen Asa, Psy.D. Clinical Neuropsychologist

## 2024-01-11 NOTE — Progress Notes (Signed)
 PROGRESS NOTE   Subjective/Complaints: C/o pain in her feet last night from the prevalon boots, have d/ced order from them, she does have neuropathy but this usually does not bother her greatly  ROS: as per HPI. Denies CP, SOB, abd pain +constipation, +pain from prevalon boots  Objective:   No results found.   Recent Labs    01/10/24 0444  WBC 4.9  HGB 9.3*  HCT 28.1*  PLT 258   Recent Labs    01/10/24 0444  NA 137  K 4.1  CL 104  CO2 25  GLUCOSE 172*  BUN 50*  CREATININE 1.76*  CALCIUM  8.8*        Intake/Output Summary (Last 24 hours) at 01/11/2024 1052 Last data filed at 01/10/2024 1900 Gross per 24 hour  Intake 540 ml  Output --  Net 540 ml     Wound 01/05/24 1700 Pressure Injury Heel Left Deep Tissue Pressure Injury - Purple or maroon localized area of discolored intact skin or blood-filled blister due to damage of underlying soft tissue from pressure and/or shear. (Active)     Wound 01/05/24 1700 Pressure Injury Heel Right Deep Tissue Pressure Injury - Purple or maroon localized area of discolored intact skin or blood-filled blister due to damage of underlying soft tissue from pressure and/or shear. (Active)    Physical Exam: Vital Signs Blood pressure 131/66, pulse 60, temperature 97.9 F (36.6 C), temperature source Oral, resp. rate 17, height 5' 3.5 (1.613 m), weight 55.9 kg, SpO2 100%.  Constitutional: No distress . Vital signs reviewed. Awake, alert, appropriate.  HEENT: NCAT, EOMI, oral membranes moist. Blind in L eye.  Neck: supple Cardiovascular: RRR without m/r/g appreciated. No JVD    Respiratory/Chest: CTA Bilaterally without wheezes or rales. Normal effort    GI/Abdomen: soft, +BS throughout, non-tender, non-distended Ext: no clubbing, cyanosis, or edema Psych: pleasant and cooperative Skin: dry, warm Musculoskeletal:     Cervical back: Neck supple.     Comments: Ue's 5-/5  except FA 4/5 on R and 4+/5 on L RLE- HF 3/5 limited due to pain; otherwise KE/KF 3+/5; and DF 4/5 and PF 4+/5 LLE- HF 3-/5; otherwise the same in KE/KF/DF/PF  as RLE, stable 11/21   TTP around R hip- no incision  Skin:    General: Skin is warm and dry.     Comments: R forearm IV- looks OK Skin dry- flaking on LE's  Neurological:     Mental Status: She is alert and oriented to person, place, and time.     Comments: Intact to light touch in LE's- except feet decreased- from her chronic neuropathy  Psychiatric:        Mood and Affect: Mood normal.       Assessment/Plan: 1. Functional deficits which require 3+ hours per day of interdisciplinary therapy in a comprehensive inpatient rehab setting. Physiatrist is providing close team supervision and 24 hour management of active medical problems listed below. Physiatrist and rehab team continue to assess barriers to discharge/monitor patient progress toward functional and medical goals  Care Tool:  Bathing    Body parts bathed by patient: Right arm, Left arm, Face, Chest, Abdomen, Front perineal area, Buttocks,  Right upper leg, Left upper leg, Right lower leg, Left lower leg   Body parts bathed by helper: Front perineal area, Buttocks     Bathing assist Assist Level: Minimal Assistance - Patient > 75%     Upper Body Dressing/Undressing Upper body dressing   What is the patient wearing?: Pull over shirt    Upper body assist Assist Level: Set up assist    Lower Body Dressing/Undressing Lower body dressing      What is the patient wearing?: Pants     Lower body assist Assist for lower body dressing: Moderate Assistance - Patient 50 - 74%     Toileting Toileting    Toileting assist Assist for toileting: Moderate Assistance - Patient 50 - 74%     Transfers Chair/bed transfer  Transfers assist  Chair/bed transfer activity did not occur: Safety/medical concerns  Chair/bed transfer assist level: Minimal Assistance -  Patient > 75%     Locomotion Ambulation   Ambulation assist      Assist level: Minimal Assistance - Patient > 75% Assistive device: Rollator Max distance: 132ft   Walk 10 feet activity   Assist     Assist level: Minimal Assistance - Patient > 75% Assistive device: Rollator   Walk 50 feet activity   Assist Walk 50 feet with 2 turns activity did not occur: Safety/medical concerns  Assist level: Minimal Assistance - Patient > 75% Assistive device: Rollator    Walk 150 feet activity   Assist Walk 150 feet activity did not occur: Safety/medical concerns         Walk 10 feet on uneven surface  activity   Assist Walk 10 feet on uneven surfaces activity did not occur: Safety/medical concerns         Wheelchair     Assist Is the patient using a wheelchair?: No Type of Wheelchair: Manual    Wheelchair assist level: Dependent - Patient 0% Max wheelchair distance: 150    Wheelchair 50 feet with 2 turns activity    Assist        Assist Level: Dependent - Patient 0%   Wheelchair 150 feet activity     Assist      Assist Level: Dependent - Patient 0%   Blood pressure 131/66, pulse 60, temperature 97.9 F (36.6 C), temperature source Oral, resp. rate 17, height 5' 3.5 (1.613 m), weight 55.9 kg, SpO2 100%.  Medical Problem List and Plan: 1. Functional deficits secondary to Debility from  Acute on chronic CHF exacerbation after syncope with AV block/pacemaker placement             -patient may  shower- pacemaker placed 11/10             -ELOS/Goals: 14-16 days- mod I to supervision  -Continue CIR  Team conference today   2.  Antithrombotics: -DVT/anticoagulation:  Mechanical: Sequential compression devices, below knee Bilateral lower extremities -Pharmaceutical: Lovenox  30mg  daily start in CIR, continue             -antiplatelet therapy: N/A -Venous Duplex pending.    3. 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 continue  Oxycodone , Tylenol  and lidocaine  patch prn.    4. Mood/Behavior/Sleep: LCSW to follow for evaluation and support when available.              -antipsychotic agents: n/a             -sleep: continue melatonin 5 mg , trazodone  PRN   5. Neuropsych/cognition: This patient is  capable of making decisions on her own behalf.   6. Skin/Wound Care: Routine pressure relief measures. Monitor chest surgical site for infection.    7. Fluids/Electrolytes/Nutrition: Monitor I&O and weight, continue vitamins/supplements. Follow up labs CBC/CMP     8. 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   -HR stable, monitor   9. Acute/Chronic systolic CHF: hx of Nonischemic Cardiomyopathy-Holding Entresto  and SGLT2 due to AKI on CKD3B. Continue Irbesartan  300mg  daily and Torsemide  20mg  daily.  Weight reviewed and is decreased Filed Weights   01/09/24 0500 01/10/24 0500 01/11/24 0702  Weight: 59.4 kg 55.5 kg 55.9 kg      10. AKI on CKD IIIB: on Torsemide , monitor CMP. Cr 1.69 11/14>> 1.75 on 11/15, repeat BMP tomorrow   11. T2DM: uncontrolled, A1C 8.1, d/c ISS, decrease lantus  to 21U  Recent Labs    01/10/24 2052  GLUCAP 151*      12. Right Inferior Pubic Rami Fx: conservative management, weightbearing as tolerated.    13. HLD: continue Lipitor 40 mg    14. Constipation: continue Senna 8.6mg  daily.  LBM 11/19, continue magnesium  supplement, magnesium  level reviewed and is 2.2  15. Hyperkalemia: potassium reviewed and has normalized  16. Anemia-chronic: stable, asked nursing to please obtain stool occult  17. Polypharmacy: d/c oxycodone , vitamin C  supplement, miralax     LOS: 6 days A FACE TO FACE EVALUATION WAS PERFORMED  Sven P Landon Truax 01/11/2024, 10:52 AM

## 2024-01-11 NOTE — Plan of Care (Signed)
  Problem: Consults Goal: RH GENERAL PATIENT EDUCATION Description: See Patient Education module for education specifics. Outcome: Progressing Goal: Skin Care Protocol Initiated - if Braden Score 18 or less Description: If consults are not indicated, leave blank or document N/A Outcome: Progressing Goal: Diabetes Guidelines if Diabetic/Glucose > 140 Description: If diabetic or lab glucose is > 140 mg/dl - Initiate Diabetes/Hyperglycemia Guidelines & Document Interventions  Outcome: Progressing   Problem: RH BOWEL ELIMINATION Goal: RH STG MANAGE BOWEL WITH ASSISTANCE Description: STG Manage Bowel with mod I Assistance. Outcome: Progressing Goal: RH STG MANAGE BOWEL W/MEDICATION W/ASSISTANCE Description: STG Manage Bowel with Medication with Mod I Assistance. Outcome: Progressing   Problem: RH BLADDER ELIMINATION Goal: RH STG MANAGE BLADDER WITH ASSISTANCE Description: STG Manage Bladder With Toileting min Assistance Outcome: Progressing   Problem: RH SKIN INTEGRITY Goal: RH STG SKIN FREE OF INFECTION/BREAKDOWN Description: Manage with min assist  Outcome: Progressing Goal: RH STG MAINTAIN SKIN INTEGRITY WITH ASSISTANCE Description: STG Maintain Skin Integrity With min Assistance. Outcome: Progressing Goal: RH STG ABLE TO PERFORM INCISION/WOUND CARE W/ASSISTANCE Description: STG Able To Perform Incision/Wound Care With min Assistance. Outcome: Progressing   Problem: RH SAFETY Goal: RH STG ADHERE TO SAFETY PRECAUTIONS W/ASSISTANCE/DEVICE Description: STG Adhere to Safety Precautions With Assistance/Device. Outcome: Progressing   Problem: RH PAIN MANAGEMENT Goal: RH STG PAIN MANAGED AT OR BELOW PT'S PAIN GOAL Outcome: Progressing   Problem: RH KNOWLEDGE DEFICIT GENERAL Goal: RH STG INCREASE KNOWLEDGE OF SELF CARE AFTER HOSPITALIZATION Description: Patient and family will be able to manage care at discharge using educational resources for medications skin care and dietary  modifications independently Outcome: Progressing

## 2024-01-11 NOTE — Progress Notes (Signed)
 Occupational Therapy Session Note  Patient Details  Name: [name] MRN: [id number] Date of Birth: 1945/10/09  Today's Date: 01/11/2024 OT Individual Time: 8464-8366 OT Individual Time Calculation (min): 58 min    Short Term Goals: Week 1:  OT Short Term Goal 1 (Week 1): patient will complete LB dressing min A with LRAD OT Short Term Goal 2 (Week 1): patient will complete functional toilet transfer with min A OT Short Term Goal 3 (Week 1): patient will tolerate standing 45 seconds with o2 within precaution range and good balance to increase ability to complete standing ADL tasks  Skilled Therapeutic Interventions/Progress Updates:   Patient agreeable to participate in OT session. Reports 4/10 pain level. Patient participated in skilled OT session focusing on functional mobility, activity tolerance, sit to stands, dynamic balance patient able to complete functional mobility to gym 171 ft with rollator CGA. Patient then took sitting rest break then completed additional 350 ft with rollator, CGA with reports of fatigue following. Patient required functional rest break following functional mobility. Mobility completed to increase ADL completion without fatigue or BP symptoms. Patient also completed for increased community mobility. Patient then completed 3 sets of 10 sit to stands with focus on single hand use. Patient completed dynamic balance tossing activity with good balance CGA. Patient returned to room via functional mobility all needs in reach.Therapist facilitated increased balance and safety during all mobility and transfers.     Therapy Documentation Precautions:  Precautions Precautions: ICD/Pacemaker Recall of Precautions/Restrictions: Intact Precaution/Restrictions Comments: pacemaker Restrictions Weight Bearing Restrictions Per Provider Order: Yes LUE Weight Bearing Per Provider Order: Non weight bearing RLE Weight Bearing Per Provider Order: Weight bearing as tolerated Other  Position/Activity Restrictions: LUE limited wt bearing and ROM post PPM  Therapy/Group: Individual Therapy  D'mariea L Haydyn Girvan 01/11/2024, 7:30 AM

## 2024-01-11 NOTE — Progress Notes (Signed)
 Physical Therapy Session Note  Patient Details  Name: [name] MRN: [id number] Date of Birth: 10-Mar-1945  Today's Date: 01/11/2024 PT Individual Time: 8983-8885 PT Individual Time Calculation (min): 58 min   Short Term Goals: Week 1:  PT Short Term Goal 1 (Week 1): Pt will increase bed mobility to contact gaurd. PT Short Term Goal 2 (Week 1): Pt will increase sit to stand transfers with LRAD to contact gaurd. PT Short Term Goal 3 (Week 1): Pt will increase transfers with LRAD to min A PT Short Term Goal 4 (Week 1): Pt will ascend/descend 1 step with hemiwalker and mod A. PT Short Term Goal 5 (Week 1): Pt will ambulate about 50 feet with LRAD and min A.  Skilled Therapeutic Interventions/Progress Updates: Pt transfers sit to stand w/ supervision and cues for LUE/pacer precautions.  Pt able to lock and unlock brakes to rollator w/ R hand.  Pt amb multiple trials w/ rollator and at least 150' before resting on seat.  Pt instructed to rest when needed and appropriately places rollator on wall w/ occasional cues for LUE precautions.  Pt performed Nu-step at Level 1 x 2 x 4 trials maintaining ~31 spm for average METS of 1.5 and 431 total steps.  Pt required 2 rest breaks between trials 2/2 LE fatigue.  Pt returned to room and in recliner w/ seat alarm on and handed off to OT.     Therapy Documentation Precautions:  Precautions Precautions: ICD/Pacemaker Recall of Precautions/Restrictions: Intact Precaution/Restrictions Comments: pacemaker Restrictions Weight Bearing Restrictions Per Provider Order: Yes LUE Weight Bearing Per Provider Order: Non weight bearing RLE Weight Bearing Per Provider Order: Weight bearing as tolerated Other Position/Activity Restrictions: LUE limited wt bearing and ROM post PPM General:   Vital Signs:   Pain: unrated to R pelvis, but decreased w/ activity.      Therapy/Group: Individual Therapy  Jeffrie Stander P Oley Lahaie 01/11/2024, 12:56 PM

## 2024-01-11 NOTE — Progress Notes (Signed)
 Occupational Therapy Session Note  Patient Details  Name: [name] MRN: [id number] Date of Birth: October 17, 1945  Today's Date: 01/11/2024 OT Individual Time: 1110-1200 OT Individual Time Calculation (min): 50 min    Short Term Goals: Week 1:  OT Short Term Goal 1 (Week 1): patient will complete LB dressing min A with LRAD OT Short Term Goal 2 (Week 1): patient will complete functional toilet transfer with min A OT Short Term Goal 3 (Week 1): patient will tolerate standing 45 seconds with o2 within precaution range and good balance to increase ability to complete standing ADL tasks  Skilled Therapeutic Interventions/Progress Updates:    Pt received in recliner ready for therapy. Discussed her recent pacemaker placement and reviewed precautions.  Gave her a hand out but due to visual deficits she is not able to read it.  She can give the handout to her family.  Verbally reviewed with her that now that she is more than 10 days post op, she can use L hand to wash under R arm and lift L arm to shoulder level.   She should not reach overhead or push or pull with arm until Christmas or 6 wks post op.  Pt stated she could recall this information.   Had pt scoot forward in recliner using R hand only and practice figure 4 stretches with foot crossed over knee. Pt able to achieve position well and recommended this position for washing feet and donning socks/shoes.  Worked on sit to stands with R hand support from both the recliner and a standard arm chair with only CGA for several reps in a row with good activity tolerance.  Had pt try 3 sit to stands in a row from arm chair for a full 3 sets with NO R hand support with mod A to rise and min to sit to challenge her leg strength.  Pt did find this challenging but also enjoyed the challenge. Advised her this was purely for leg strength challenge, that she should always use R hand to guide her to stand and sit safely.   Pt participated well and enjoyed  talking about her family and her travels to [place].   Pt resting in recliner with all needs met and alarm set.   Therapy Documentation Precautions:  Precautions Precautions: ICD/Pacemaker Recall of Precautions/Restrictions: Intact Precaution/Restrictions Comments: pacemaker Restrictions Weight Bearing Restrictions Per Provider Order: Yes LUE Weight Bearing Per Provider Order: Non weight bearing RLE Weight Bearing Per Provider Order: Weight bearing as tolerated Other Position/Activity Restrictions: LUE limited wt bearing and ROM post PPM   Pain: no c/o pain    ADL: ADL Eating: Modified independent Grooming: Setup Upper Body Bathing: Supervision/safety Where Assessed-Upper Body Bathing: Edge of bed Lower Body Bathing: Minimal assistance Where Assessed-Lower Body Bathing: Edge of bed Upper Body Dressing: Setup Where Assessed-Upper Body Dressing: Edge of bed Lower Body Dressing: Minimal assistance Where Assessed-Lower Body Dressing: Edge of bed Toileting: Moderate assistance Where Assessed-Toileting: Toilet, Psychiatrist Transfer: Moderate assistance, Maximal assistance Toilet Transfer Method: Stand pivot Toilet Transfer Equipment: Bedside commode Tub/Shower Transfer Method: Unable to assess [employer] Transfer: Not assessed   Therapy/Group: Individual Therapy  [name] 01/11/2024, 12:28 PM

## 2024-01-12 LAB — GLUCOSE, CAPILLARY: Glucose-Capillary: 105 mg/dL — ABNORMAL HIGH (ref 70–99)

## 2024-01-12 NOTE — Progress Notes (Signed)
 Physical Therapy Discharge Summary  Patient Details  Name: ALAIRA LEVEL MRN: [id number] Date of Birth: 04-Nov-1945  Date of Discharge from PT service:January 14, 2024  Patient has met {NUMBERS [id number]:[id number]} of 9 long term goals due to improved activity tolerance, improved balance, improved postural control, increased strength, decreased pain, ability to compensate for deficits, improved attention, improved awareness, and improved coordination.  Patient to discharge at an ambulatory level {[id number]} using rollator. Patient's care partner is independent to provide the necessary physical assistance at discharge.  Reasons goals not met: ***  Recommendation:  Patient will benefit from ongoing skilled PT services in home health setting to continue to advance safe functional mobility, address ongoing impairments in transfers, generalized strengthening and endurance, dynamic standing balance/coordination, ambulation, and to minimize fall risk.  Equipment: No equipment provided; already has rollator  Reasons for discharge: treatment goals met  Patient/family agrees with progress made and goals achieved: Yes  PT Discharge Precautions/Restrictions Precautions Precautions: ICD/Pacemaker Recall of Precautions/Restrictions: Intact Precaution/Restrictions Comments: pacemaker Restrictions Weight Bearing Restrictions Per Provider Order: Yes LUE Weight Bearing Per Provider Order: Non weight bearing Pain Interference   Cognition Overall Cognitive Status: Within Functional Limits for tasks assessed Arousal/Alertness: Awake/alert Orientation Level: Oriented X4 Memory: Impaired Awareness: Appears intact Problem Solving: Appears intact Safety/Judgment: Appears intact Sensation   Motor  Motor Motor: Within Functional Limits  Mobility   Locomotion     Trunk/Postural Assessment  Cervical Assessment Cervical Assessment: Exceptions to [facility] (mild forward head) Thoracic Assessment Thoracic  Assessment: Within Functional Limits Lumbar Assessment Lumbar Assessment: Exceptions to [facility] (mild posterior pelvic tilt) Postural Control Postural Control: Within Functional Limits  Balance   Extremity Assessment        Therisa HERO Zaunegger Therisa Stains PT, DPT 01/12/2024, 6:55 AM

## 2024-01-12 NOTE — Plan of Care (Signed)
  Problem: Consults Goal: RH GENERAL PATIENT EDUCATION Description: See Patient Education module for education specifics. Outcome: Progressing Goal: Skin Care Protocol Initiated - if Braden Score 18 or less Description: If consults are not indicated, leave blank or document N/A Outcome: Progressing Goal: Diabetes Guidelines if Diabetic/Glucose > 140 Description: If diabetic or lab glucose is > 140 mg/dl - Initiate Diabetes/Hyperglycemia Guidelines & Document Interventions  Outcome: Progressing   Problem: RH BOWEL ELIMINATION Goal: RH STG MANAGE BOWEL WITH ASSISTANCE Description: STG Manage Bowel with mod I Assistance. Outcome: Progressing Goal: RH STG MANAGE BOWEL W/MEDICATION W/ASSISTANCE Description: STG Manage Bowel with Medication with Mod I Assistance. Outcome: Progressing   Problem: RH BLADDER ELIMINATION Goal: RH STG MANAGE BLADDER WITH ASSISTANCE Description: STG Manage Bladder With Toileting min Assistance Outcome: Progressing   Problem: RH SKIN INTEGRITY Goal: RH STG SKIN FREE OF INFECTION/BREAKDOWN Description: Manage with min assist  Outcome: Progressing Goal: RH STG MAINTAIN SKIN INTEGRITY WITH ASSISTANCE Description: STG Maintain Skin Integrity With min Assistance. Outcome: Progressing Goal: RH STG ABLE TO PERFORM INCISION/WOUND CARE W/ASSISTANCE Description: STG Able To Perform Incision/Wound Care With min Assistance. Outcome: Progressing   Problem: RH SAFETY Goal: RH STG ADHERE TO SAFETY PRECAUTIONS W/ASSISTANCE/DEVICE Description: STG Adhere to Safety Precautions With Assistance/Device. Outcome: Progressing   Problem: RH PAIN MANAGEMENT Goal: RH STG PAIN MANAGED AT OR BELOW PT'S PAIN GOAL Outcome: Progressing   Problem: RH KNOWLEDGE DEFICIT GENERAL Goal: RH STG INCREASE KNOWLEDGE OF SELF CARE AFTER HOSPITALIZATION Description: Patient and family will be able to manage care at discharge using educational resources for medications skin care and dietary  modifications independently Outcome: Progressing

## 2024-01-12 NOTE — Progress Notes (Addendum)
 Pt refused blood sugar check by staff- she reported she is using her cgm. Her cgm showed bs of 160.

## 2024-01-12 NOTE — Progress Notes (Signed)
 PROGRESS NOTE   Subjective/Complaints:  Pt doing well, slept well, pain well managed, LBM this morning per pt but not documented, urinating fine. No other complaints or concerns.   ROS: as per HPI. Denies CP, SOB, abd pain +constipation, +pain from prevalon boots  Objective:   No results found.   Recent Labs    01/10/24 0444  WBC 4.9  HGB 9.3*  HCT 28.1*  PLT 258   Recent Labs    01/10/24 0444  NA 137  K 4.1  CL 104  CO2 25  GLUCOSE 172*  BUN 50*  CREATININE 1.76*  CALCIUM  8.8*        Intake/Output Summary (Last 24 hours) at 01/12/2024 1322 Last data filed at 01/12/2024 1256 Gross per 24 hour  Intake 1306 ml  Output --  Net 1306 ml     Wound 01/05/24 1700 Pressure Injury Heel Left Deep Tissue Pressure Injury - Purple or maroon localized area of discolored intact skin or blood-filled blister due to damage of underlying soft tissue from pressure and/or shear. (Active)     Wound 01/05/24 1700 Pressure Injury Heel Right Deep Tissue Pressure Injury - Purple or maroon localized area of discolored intact skin or blood-filled blister due to damage of underlying soft tissue from pressure and/or shear. (Active)    Physical Exam: Vital Signs Blood pressure (!) 104/54, pulse (!) 59, temperature 98.1 F (36.7 C), temperature source Oral, resp. rate 16, height 5' 3.5 (1.613 m), weight 59 kg, SpO2 100%.  Constitutional: No distress . Vital signs reviewed. Awake, alert, appropriate. Sitting up in bed HEENT: NCAT, EOMI, oral membranes moist. Blind in L eye.  Neck: supple Cardiovascular: RRR without m/r/g appreciated. No JVD    Respiratory/Chest: CTA Bilaterally without wheezes or rales. Normal effort    GI/Abdomen: soft, +BS throughout, non-tender, non-distended Ext: no clubbing, cyanosis, or edema Psych: pleasant and cooperative Skin: dry, warm  PRIOR EXAMS: Musculoskeletal:     Cervical back: Neck supple.      Comments: Ue's 5-/5 except FA 4/5 on R and 4+/5 on L RLE- HF 3/5 limited due to pain; otherwise KE/KF 3+/5; and DF 4/5 and PF 4+/5 LLE- HF 3-/5; otherwise the same in KE/KF/DF/PF  as RLE, stable 11/21   TTP around R hip- no incision  Skin:    General: Skin is warm and dry.     Comments: R forearm IV- looks OK Skin dry- flaking on LE's  Neurological:     Mental Status: She is alert and oriented to person, place, and time.     Comments: Intact to light touch in LE's- except feet decreased- from her chronic neuropathy  Psychiatric:        Mood and Affect: Mood normal.       Assessment/Plan: 1. Functional deficits which require 3+ hours per day of interdisciplinary therapy in a comprehensive inpatient rehab setting. Physiatrist is providing close team supervision and 24 hour management of active medical problems listed below. Physiatrist and rehab team continue to assess barriers to discharge/monitor patient progress toward functional and medical goals  Care Tool:  Bathing    Body parts bathed by patient: Right arm, Left arm, Face, Chest,  Abdomen, Front perineal area, Buttocks, Right upper leg, Left upper leg, Right lower leg, Left lower leg   Body parts bathed by helper: Front perineal area, Buttocks     Bathing assist Assist Level: Minimal Assistance - Patient > 75%     Upper Body Dressing/Undressing Upper body dressing   What is the patient wearing?: Pull over shirt    Upper body assist Assist Level: Set up assist    Lower Body Dressing/Undressing Lower body dressing      What is the patient wearing?: Pants     Lower body assist Assist for lower body dressing: Moderate Assistance - Patient 50 - 74%     Toileting Toileting    Toileting assist Assist for toileting: Moderate Assistance - Patient 50 - 74%     Transfers Chair/bed transfer  Transfers assist  Chair/bed transfer activity did not occur: Safety/medical concerns  Chair/bed transfer assist level:  Minimal Assistance - Patient > 75%     Locomotion Ambulation   Ambulation assist      Assist level: Supervision/Verbal cueing Assistive device: Rollator Max distance: 150+   Walk 10 feet activity   Assist     Assist level: Supervision/Verbal cueing Assistive device: Rollator   Walk 50 feet activity   Assist Walk 50 feet with 2 turns activity did not occur: Safety/medical concerns  Assist level: Supervision/Verbal cueing Assistive device: Rollator    Walk 150 feet activity   Assist Walk 150 feet activity did not occur: Safety/medical concerns  Assist level: Supervision/Verbal cueing Assistive device: Rollator    Walk 10 feet on uneven surface  activity   Assist Walk 10 feet on uneven surfaces activity did not occur: Safety/medical concerns   Assist level: Contact Guard/Touching assist (ramp) Assistive device: Rollator   Wheelchair     Assist Is the patient using a wheelchair?: No Type of Wheelchair: Manual    Wheelchair assist level: Dependent - Patient 0% Max wheelchair distance: 150    Wheelchair 50 feet with 2 turns activity    Assist        Assist Level: Dependent - Patient 0%   Wheelchair 150 feet activity     Assist      Assist Level: Dependent - Patient 0%   Blood pressure (!) 104/54, pulse (!) 59, temperature 98.1 F (36.7 C), temperature source Oral, resp. rate 16, height 5' 3.5 (1.613 m), weight 59 kg, SpO2 100%.  Medical Problem List and Plan: 1. Functional deficits secondary to Debility from  Acute on chronic CHF exacerbation after syncope with AV block/pacemaker placement             -patient may  shower- pacemaker placed 11/10             -ELOS/Goals: 14-16 days- mod I to supervision  -Continue CIR  Team conference today   2.  Antithrombotics: -DVT/anticoagulation:  Mechanical: Sequential compression devices, below knee Bilateral lower extremities -Pharmaceutical: Lovenox  30mg  daily start in CIR,  continue             -antiplatelet therapy: N/A -Venous Duplex pending.    3. 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 continue Oxycodone , Tylenol  and lidocaine  patch prn.    4. Mood/Behavior/Sleep: LCSW to follow for evaluation and support when available.              -antipsychotic agents: n/a             -sleep: continue melatonin 5 mg , trazodone  PRN   5. Neuropsych/cognition:  This patient is capable of making decisions on her own behalf.   6. Skin/Wound Care: Routine pressure relief measures. Monitor chest surgical site for infection.    7. Fluids/Electrolytes/Nutrition: Monitor I&O and weight, continue vitamins/supplements. Follow up labs CBC/CMP     8. 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   -HR stable, monitor   9. Acute/Chronic systolic CHF: hx of Nonischemic Cardiomyopathy-Holding Entresto  and SGLT2 due to AKI on CKD3B. Continue Irbesartan  300mg  daily and Torsemide  20mg  daily.  Weight reviewed 11/22 and increased slightly but euvolemic on exam Filed Weights   01/10/24 0500 01/11/24 0702 01/12/24 0500  Weight: 55.5 kg 55.9 kg 59 kg      10. AKI on CKD IIIB: on Torsemide , monitor CMP. Cr 1.69 11/14>> 1.75 on 11/15, repeat BMP tomorrow   11. T2DM: uncontrolled, A1C 8.1, Lantus  20U daily resumed, Jardience to be resumed when mobility improves. d/c ISS, decrease lantus  to 21U  -01/12/24 CBGs fine, cont regimen as is Recent Labs    01/11/24 1207 01/11/24 2024 01/12/24 0547  GLUCAP 81 151* 105*      12. Right Inferior Pubic Rami Fx: conservative management, weightbearing as tolerated.    13. HLD: continue Lipitor 40 mg    14. Constipation: continue Senna 8.6mg  daily.  LBM 11/19, continue magnesium  supplement, magnesium  level reviewed and is 2.2  -01/12/24 LBM this AM per pt, not documented, might have been yesterday? Monitor.   15. Hyperkalemia: potassium reviewed and has normalized  16. Anemia-chronic: stable,  asked nursing to please obtain stool occult  17. Polypharmacy: d/c oxycodone , vitamin C  supplement, miralax     LOS: 7 days A FACE TO FACE EVALUATION WAS PERFORMED  [address] 01/12/2024, 1:22 PM

## 2024-01-12 NOTE — Plan of Care (Signed)
  Problem: Consults Goal: RH GENERAL PATIENT EDUCATION Description: See Patient Education module for education specifics. Outcome: Progressing Goal: Skin Care Protocol Initiated - if Braden Score 18 or less Description: If consults are not indicated, leave blank or document N/A Outcome: Progressing Goal: Diabetes Guidelines if Diabetic/Glucose > 140 Description: If diabetic or lab glucose is > 140 mg/dl - Initiate Diabetes/Hyperglycemia Guidelines & Document Interventions  Outcome: Progressing   Problem: RH BOWEL ELIMINATION Goal: RH STG MANAGE BOWEL WITH ASSISTANCE Description: STG Manage Bowel with mod I Assistance. Outcome: Progressing Goal: RH STG MANAGE BOWEL W/MEDICATION W/ASSISTANCE Description: STG Manage Bowel with Medication with Mod I Assistance. Outcome: Progressing   Problem: RH BLADDER ELIMINATION Goal: RH STG MANAGE BLADDER WITH ASSISTANCE Description: STG Manage Bladder With Toileting min Assistance Outcome: Progressing   Problem: RH SKIN INTEGRITY Goal: RH STG SKIN FREE OF INFECTION/BREAKDOWN Description: Manage with min assist  Outcome: Progressing Goal: RH STG MAINTAIN SKIN INTEGRITY WITH ASSISTANCE Description: STG Maintain Skin Integrity With min Assistance. Outcome: Progressing Goal: RH STG ABLE TO PERFORM INCISION/WOUND CARE W/ASSISTANCE Description: STG Able To Perform Incision/Wound Care With min Assistance. Outcome: Progressing   Problem: RH SAFETY Goal: RH STG ADHERE TO SAFETY PRECAUTIONS W/ASSISTANCE/DEVICE Description: STG Adhere to Safety Precautions With Assistance/Device. Outcome: Progressing

## 2024-01-13 DIAGNOSIS — R197 Diarrhea, unspecified: Secondary | ICD-10-CM

## 2024-01-13 MED ORDER — LOPERAMIDE HCL 2 MG PO CAPS
2.0000 mg | ORAL_CAPSULE | ORAL | Status: DC | PRN
  Administered 2024-01-13: 2 mg via ORAL
  Filled 2024-01-13: qty 1

## 2024-01-13 MED ORDER — SENNA 8.6 MG PO TABS: 1.0000 | ORAL_TABLET | Freq: Every day | ORAL | Status: DC | PRN

## 2024-01-13 MED ORDER — LOPERAMIDE HCL 2 MG PO CAPS
2.0000 mg | ORAL_CAPSULE | Freq: Three times a day (TID) | ORAL | Status: DC | PRN
  Administered 2024-01-13: 2 mg via ORAL
  Filled 2024-01-13: qty 1

## 2024-01-13 NOTE — Progress Notes (Signed)
 Occupational Therapy Session Note  Patient Details  Name: KINZE LABO MRN: [id number] Date of Birth: Oct 11, 1945  Today's Date: 01/13/2024 OT Individual Time: 1100-1125 OT Individual Time Calculation (min): 25 min  and Today's Date: 01/13/2024 OT Missed Time: 35 Minutes Missed Time Reason: Other (comment);Patient fatigue (diarrhea and low CBG)   Short Term Goals: Week 1:  OT Short Term Goal 1 (Week 1): patient will complete LB dressing min A with LRAD OT Short Term Goal 2 (Week 1): patient will complete functional toilet transfer with min A OT Short Term Goal 3 (Week 1): patient will tolerate standing 45 seconds with o2 within precaution range and good balance to increase ability to complete standing ADL tasks  Skilled Therapeutic Interventions/Progress Updates:    Pt resting in bed upon arrival. Pt reports her night was terrible because she had diarrhea throughout the night and messed up all her clothing. Pt declined OOB therapy this morning stating she was exhausted from previous night. Reviewed home setup and home safety education. Pt independently recalled LUE ROM restrictions. Reviewed energy conservations strategies. Pt remained in bed with all needs within reach. Bed alarm activated. Pt missed 35 mins skilled OT services. Will attempt to make up as schedules allow.   Therapy Documentation Precautions:  Precautions Precautions: ICD/Pacemaker Recall of Precautions/Restrictions: Intact Precaution/Restrictions Comments: pacemaker Restrictions Weight Bearing Restrictions Per Provider Order: Yes LUE Weight Bearing Per Provider Order: Non weight bearing RLE Weight Bearing Per Provider Order: Weight bearing as tolerated Other Position/Activity Restrictions: LUE limited wt bearing and ROM post PPM General: General OT Amount of Missed Time: 35 Minutes   Pain: Pt c/o abdominal tenderness which she attributes to diarrhea throughout the night; emotional support   Therapy/Group:  Individual Therapy  Maritza Debby Mare 01/13/2024, 11:51 AM

## 2024-01-13 NOTE — Plan of Care (Signed)
  Problem: Consults Goal: RH GENERAL PATIENT EDUCATION Description: See Patient Education module for education specifics. Outcome: Progressing Goal: Skin Care Protocol Initiated - if Braden Score 18 or less Description: If consults are not indicated, leave blank or document N/A Outcome: Progressing Goal: Diabetes Guidelines if Diabetic/Glucose > 140 Description: If diabetic or lab glucose is > 140 mg/dl - Initiate Diabetes/Hyperglycemia Guidelines & Document Interventions  Outcome: Progressing   Problem: RH BOWEL ELIMINATION Goal: RH STG MANAGE BOWEL WITH ASSISTANCE Description: STG Manage Bowel with mod I Assistance. Outcome: Progressing Goal: RH STG MANAGE BOWEL W/MEDICATION W/ASSISTANCE Description: STG Manage Bowel with Medication with Mod I Assistance. Outcome: Progressing   Problem: RH BLADDER ELIMINATION Goal: RH STG MANAGE BLADDER WITH ASSISTANCE Description: STG Manage Bladder With Toileting min Assistance Outcome: Progressing   Problem: RH SKIN INTEGRITY Goal: RH STG SKIN FREE OF INFECTION/BREAKDOWN Description: Manage with min assist  Outcome: Progressing Goal: RH STG MAINTAIN SKIN INTEGRITY WITH ASSISTANCE Description: STG Maintain Skin Integrity With min Assistance. Outcome: Progressing Goal: RH STG ABLE TO PERFORM INCISION/WOUND CARE W/ASSISTANCE Description: STG Able To Perform Incision/Wound Care With min Assistance. Outcome: Progressing   Problem: RH SAFETY Goal: RH STG ADHERE TO SAFETY PRECAUTIONS W/ASSISTANCE/DEVICE Description: STG Adhere to Safety Precautions With Assistance/Device. Outcome: Progressing   Problem: RH KNOWLEDGE DEFICIT GENERAL Goal: RH STG INCREASE KNOWLEDGE OF SELF CARE AFTER HOSPITALIZATION Description: Patient and family will be able to manage care at discharge using educational resources for medications skin care and dietary modifications independently Outcome: Progressing

## 2024-01-13 NOTE — Progress Notes (Signed)
 Pt refused blood sugar check by staff- she reported she is using her cgm. Her cgm showed bs of 219

## 2024-01-13 NOTE — Plan of Care (Signed)
  Problem: Consults Goal: RH GENERAL PATIENT EDUCATION Description: See Patient Education module for education specifics. Outcome: Progressing Goal: Skin Care Protocol Initiated - if Braden Score 18 or less Description: If consults are not indicated, leave blank or document N/A Outcome: Progressing Goal: Diabetes Guidelines if Diabetic/Glucose > 140 Description: If diabetic or lab glucose is > 140 mg/dl - Initiate Diabetes/Hyperglycemia Guidelines & Document Interventions  Outcome: Progressing   Problem: RH BOWEL ELIMINATION Goal: RH STG MANAGE BOWEL WITH ASSISTANCE Description: STG Manage Bowel with mod I Assistance. Outcome: Progressing Goal: RH STG MANAGE BOWEL W/MEDICATION W/ASSISTANCE Description: STG Manage Bowel with Medication with Mod I Assistance. Outcome: Progressing   Problem: RH BLADDER ELIMINATION Goal: RH STG MANAGE BLADDER WITH ASSISTANCE Description: STG Manage Bladder With Toileting min Assistance Outcome: Progressing   Problem: RH SKIN INTEGRITY Goal: RH STG SKIN FREE OF INFECTION/BREAKDOWN Description: Manage with min assist  Outcome: Progressing Goal: RH STG MAINTAIN SKIN INTEGRITY WITH ASSISTANCE Description: STG Maintain Skin Integrity With min Assistance. Outcome: Progressing Goal: RH STG ABLE TO PERFORM INCISION/WOUND CARE W/ASSISTANCE Description: STG Able To Perform Incision/Wound Care With min Assistance. Outcome: Progressing   Problem: RH SAFETY Goal: RH STG ADHERE TO SAFETY PRECAUTIONS W/ASSISTANCE/DEVICE Description: STG Adhere to Safety Precautions With Assistance/Device. Outcome: Progressing   Problem: RH PAIN MANAGEMENT Goal: RH STG PAIN MANAGED AT OR BELOW PT'S PAIN GOAL Outcome: Progressing   Problem: RH KNOWLEDGE DEFICIT GENERAL Goal: RH STG INCREASE KNOWLEDGE OF SELF CARE AFTER HOSPITALIZATION Description: Patient and family will be able to manage care at discharge using educational resources for medications skin care and dietary  modifications independently Outcome: Progressing

## 2024-01-13 NOTE — Progress Notes (Signed)
 PROGRESS NOTE   Subjective/Complaints:  Pt doing ok but up all night with diarrhea, agreeable with stopping senna. Denies pain. Urinating fine. No other complaints or concerns.   ROS: as per HPI. Denies CP, SOB, abd pain +constipation-now diarrhea, +pain from prevalon boots  Objective:   No results found.   No results for input(s): WBC, HGB, HCT, PLT in the last 72 hours.  No results for input(s): NA, K, CL, CO2, GLUCOSE, BUN, CREATININE, CALCIUM  in the last 72 hours.       Intake/Output Summary (Last 24 hours) at 01/13/2024 1125 Last data filed at 01/13/2024 0746 Gross per 24 hour  Intake 1426 ml  Output --  Net 1426 ml     Wound 01/05/24 1700 Pressure Injury Heel Left Deep Tissue Pressure Injury - Purple or maroon localized area of discolored intact skin or blood-filled blister due to damage of underlying soft tissue from pressure and/or shear. (Active)     Wound 01/05/24 1700 Pressure Injury Heel Right Deep Tissue Pressure Injury - Purple or maroon localized area of discolored intact skin or blood-filled blister due to damage of underlying soft tissue from pressure and/or shear. (Active)    Physical Exam: Vital Signs Blood pressure 137/74, pulse 62, temperature 98 F (36.7 C), temperature source Oral, resp. rate 18, height 5' 3.5 (1.613 m), weight 60 kg, SpO2 100%.  Constitutional: No distress . Vital signs reviewed. Awake, alert, appropriate. Sitting up in bed HEENT: NCAT, EOMI, oral membranes a little dry. Blind in L eye.  Neck: supple Cardiovascular: RRR without m/r/g appreciated. No JVD    Respiratory/Chest: CTA Bilaterally without wheezes or rales. Normal effort    GI/Abdomen: soft, +BS throughout, non-tender, non-distended Ext: no clubbing, cyanosis, or edema Psych: pleasant and cooperative Skin: dry, warm  PRIOR EXAMS: Musculoskeletal:     Cervical back: Neck supple.      Comments: Ue's 5-/5 except FA 4/5 on R and 4+/5 on L RLE- HF 3/5 limited due to pain; otherwise KE/KF 3+/5; and DF 4/5 and PF 4+/5 LLE- HF 3-/5; otherwise the same in KE/KF/DF/PF  as RLE, stable 11/21   TTP around R hip- no incision  Skin:    General: Skin is warm and dry.     Comments: R forearm IV- looks OK Skin dry- flaking on LE's  Neurological:     Mental Status: She is alert and oriented to person, place, and time.     Comments: Intact to light touch in LE's- except feet decreased- from her chronic neuropathy  Psychiatric:        Mood and Affect: Mood normal.       Assessment/Plan: 1. Functional deficits which require 3+ hours per day of interdisciplinary therapy in a comprehensive inpatient rehab setting. Physiatrist is providing close team supervision and 24 hour management of active medical problems listed below. Physiatrist and rehab team continue to assess barriers to discharge/monitor patient progress toward functional and medical goals  Care Tool:  Bathing    Body parts bathed by patient: Right arm, Left arm, Face, Chest, Abdomen, Front perineal area, Buttocks, Right upper leg, Left upper leg, Right lower leg, Left lower leg   Body parts bathed  by helper: Front perineal area, Buttocks     Bathing assist Assist Level: Minimal Assistance - Patient > 75%     Upper Body Dressing/Undressing Upper body dressing   What is the patient wearing?: Pull over shirt    Upper body assist Assist Level: Set up assist    Lower Body Dressing/Undressing Lower body dressing      What is the patient wearing?: Pants     Lower body assist Assist for lower body dressing: Moderate Assistance - Patient 50 - 74%     Toileting Toileting    Toileting assist Assist for toileting: Moderate Assistance - Patient 50 - 74%     Transfers Chair/bed transfer  Transfers assist  Chair/bed transfer activity did not occur: Safety/medical concerns  Chair/bed transfer assist level:  Supervision/Verbal cueing     Locomotion Ambulation   Ambulation assist      Assist level: Supervision/Verbal cueing Assistive device: Rollator Max distance: 150+   Walk 10 feet activity   Assist     Assist level: Supervision/Verbal cueing Assistive device: Rollator   Walk 50 feet activity   Assist Walk 50 feet with 2 turns activity did not occur: Safety/medical concerns  Assist level: Supervision/Verbal cueing Assistive device: Rollator    Walk 150 feet activity   Assist Walk 150 feet activity did not occur: Safety/medical concerns  Assist level: Supervision/Verbal cueing Assistive device: Rollator    Walk 10 feet on uneven surface  activity   Assist Walk 10 feet on uneven surfaces activity did not occur: Safety/medical concerns   Assist level: Contact Guard/Touching assist (ramp) Assistive device: Rollator   Wheelchair     Assist Is the patient using a wheelchair?: No Type of Wheelchair: Manual Wheelchair activity did not occur: N/A  Wheelchair assist level: Dependent - Patient 0% Max wheelchair distance: 150    Wheelchair 50 feet with 2 turns activity    Assist    Wheelchair 50 feet with 2 turns activity did not occur: N/A   Assist Level: Dependent - Patient 0%   Wheelchair 150 feet activity     Assist  Wheelchair 150 feet activity did not occur: N/A   Assist Level: Dependent - Patient 0%   Blood pressure 137/74, pulse 62, temperature 98 F (36.7 C), temperature source Oral, resp. rate 18, height 5' 3.5 (1.613 m), weight 60 kg, SpO2 100%.  Medical Problem List and Plan: 1. Functional deficits secondary to Debility from  Acute on chronic CHF exacerbation after syncope with AV block/pacemaker placement             -patient may  shower- pacemaker placed 11/10             -ELOS/Goals: 14-16 days- mod I to supervision  -Continue CIR  Team conference today   2.  Antithrombotics: -DVT/anticoagulation:  Mechanical:  Sequential compression devices, below knee Bilateral lower extremities -Pharmaceutical: Lovenox  30mg  daily start in CIR, continue             -antiplatelet therapy: N/A -Venous Duplex pending.    3. 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 continue Oxycodone , Tylenol  and lidocaine  patch prn.    4. Mood/Behavior/Sleep: LCSW to follow for evaluation and support when available.              -antipsychotic agents: n/a             -sleep: continue melatonin 5 mg , trazodone  PRN   5. Neuropsych/cognition: This patient is capable of making decisions on her  own behalf.   6. Skin/Wound Care: Routine pressure relief measures. Monitor chest surgical site for infection.    7. Fluids/Electrolytes/Nutrition: Monitor I&O and weight, continue vitamins/supplements. Follow up labs CBC/CMP     8. 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   -HR stable, monitor   9. Acute/Chronic systolic CHF: hx of Nonischemic Cardiomyopathy-Holding Entresto  and SGLT2 due to AKI on CKD3B. Continue Irbesartan  300mg  daily and Torsemide  20mg  daily. Weight reviewed 11/23 and increased slightly but euvolemic on exam, suspect bed weight discrepancy Filed Weights   01/11/24 0702 01/12/24 0500 01/13/24 0400  Weight: 55.9 kg 59 kg 60 kg      10. AKI on CKD IIIB: on Torsemide , monitor CMP. Cr 1.69 11/14>> 1.75 on 11/15, repeat BMP tomorrow   11. T2DM: uncontrolled, A1C 8.1, Lantus  20U daily resumed, Jardience to be resumed when mobility improves. d/c ISS, decrease lantus  to 21U -01/13/24 CBGs occasionally dropping low but suspect PO intake fluctuations, other times are higher; might need to go back to 20U lantus  dosing if continued drops Recent Labs    01/11/24 1207 01/11/24 2024 01/12/24 0547  GLUCAP 81 151* 105*      12. Right Inferior Pubic Rami Fx: conservative management, weightbearing as tolerated.    13. HLD: continue Lipitor 40 mg    14. Constipation: continue  Senna 8.6mg  daily.  LBM 11/19, continue magnesium  supplement, magnesium  level reviewed and is 2.2 -01/12/24 LBM this AM per pt, not documented, might have been yesterday? Monitor.  -01/13/24 diarrhea all night, change senna to PRN, might need to d/c Mg if continued  15. Hyperkalemia: potassium reviewed and has normalized  16. Anemia-chronic: stable, asked nursing to please obtain stool occult  17. Polypharmacy: d/c oxycodone , vitamin C  supplement, miralax     LOS: 8 days A FACE TO FACE EVALUATION WAS PERFORMED  [address] 01/13/2024, 11:25 AM

## 2024-01-14 ENCOUNTER — Other Ambulatory Visit (HOSPITAL_COMMUNITY): Payer: Self-pay

## 2024-01-14 LAB — CBC
HCT: 28.7 % — ABNORMAL LOW (ref 36.0–46.0)
Hemoglobin: 9.5 g/dL — ABNORMAL LOW (ref 12.0–15.0)
MCH: 31.4 pg (ref 26.0–34.0)
MCHC: 33.1 g/dL (ref 30.0–36.0)
MCV: 94.7 fL (ref 80.0–100.0)
Platelets: 304 K/uL (ref 150–400)
RBC: 3.03 MIL/uL — ABNORMAL LOW (ref 3.87–5.11)
RDW: 13.4 % (ref 11.5–15.5)
WBC: 4.6 K/uL (ref 4.0–10.5)
nRBC: 0 % (ref 0.0–0.2)

## 2024-01-14 LAB — BASIC METABOLIC PANEL WITH GFR
Anion gap: 11 (ref 5–15)
BUN: 53 mg/dL — ABNORMAL HIGH (ref 8–23)
CO2: 23 mmol/L (ref 22–32)
Calcium: 9.3 mg/dL (ref 8.9–10.3)
Chloride: 106 mmol/L (ref 98–111)
Creatinine, Ser: 1.68 mg/dL — ABNORMAL HIGH (ref 0.44–1.00)
GFR, Estimated: 31 mL/min — ABNORMAL LOW (ref >60)
Glucose, Bld: 234 mg/dL — ABNORMAL HIGH (ref 70–99)
Potassium: 4.7 mmol/L (ref 3.5–5.1)
Sodium: 140 mmol/L (ref 135–145)

## 2024-01-14 LAB — GLUCOSE, CAPILLARY: Glucose-Capillary: 238 mg/dL — ABNORMAL HIGH (ref 70–99)

## 2024-01-14 MED ORDER — IRBESARTAN 300 MG PO TABS
300.0000 mg | ORAL_TABLET | Freq: Every day | ORAL | 0 refills | Status: DC
  Filled 2024-01-14: qty 30, 30d supply, fill #0

## 2024-01-14 MED ORDER — ACETAMINOPHEN 325 MG PO TABS: 325.0000 mg | ORAL_TABLET | ORAL | Status: AC | PRN

## 2024-01-14 MED ORDER — MELATONIN 3 MG PO TABS
3.0000 mg | ORAL_TABLET | Freq: Every day | ORAL | 0 refills | Status: DC
  Filled 2024-01-14: qty 30, 30d supply, fill #0

## 2024-01-14 MED ORDER — TORSEMIDE 20 MG PO TABS
20.0000 mg | ORAL_TABLET | Freq: Every day | ORAL | 0 refills | Status: DC
  Filled 2024-01-14: qty 30, 30d supply, fill #0

## 2024-01-14 MED ORDER — OLOPATADINE HCL 0.1 % OP SOLN
1.0000 [drp] | Freq: Two times a day (BID) | OPHTHALMIC | 12 refills | Status: DC | PRN
  Filled 2024-01-14: qty 5, 50d supply, fill #0

## 2024-01-14 MED ORDER — ATORVASTATIN CALCIUM 40 MG PO TABS
40.0000 mg | ORAL_TABLET | Freq: Every day | ORAL | 0 refills | Status: AC
  Filled 2024-01-14: qty 30, 30d supply, fill #0

## 2024-01-14 MED ORDER — SENNA 8.6 MG PO TABS
1.0000 | ORAL_TABLET | Freq: Every day | ORAL | 0 refills | Status: AC | PRN
  Filled 2024-01-14: qty 30, 30d supply, fill #0

## 2024-01-14 MED ORDER — MAGNESIUM OXIDE -MG SUPPLEMENT 400 (240 MG) MG PO TABS
200.0000 mg | ORAL_TABLET | Freq: Every day | ORAL | 0 refills | Status: AC
Start: 2024-01-15 — End: ?
  Filled 2024-01-14: qty 15, 30d supply, fill #0

## 2024-01-14 NOTE — Progress Notes (Signed)
 Patient ID: [name], female   DOB: 05/31/1945, 78 y.o.   MRN: [id number] Faxed ICD report received from [employer] as requested by Ozell Jodie Passey, PA-C to Dr. Elige' office and informed patient that information was sent to his office. Hardcopy placed in soft chart for scanning at discharge. [name]

## 2024-01-14 NOTE — Discharge Summary (Signed)
 Physician Discharge Summary  Patient ID: [name] MRN: [id number] DOB/AGE: 1946-02-03 78 y.o.  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)   AKI (acute kidney injury)   Acute on chronic systolic CHF (congestive heart failure) (HCC)   Iron deficiency anemia   Symptomatic bradycardia   Complete heart block (HCC)   Coping style affecting medical condition   Discharged Condition: stable  Significant Diagnostic Studies: VAS US  LOWER EXTREMITY VENOUS (DVT) Result Date: 01/08/2024  Lower Venous DVT Study Patient Name:  [name]  Date of Exam:   01/07/2024 Medical Rec #: [id number]     Accession #:    [id number] Date of Birth: 05/25/1945     Patient Gender: F Patient Age:   37 years Exam Location:  [facility] Procedure:      VAS US  LOWER EXTREMITY VENOUS (DVT) Referring Phys: [name] --------------------------------------------------------------------------------  Indications: Immobility, patient in rehabilitation status post right inferior pubic rami fracture without displacement after a syncopal episode. Status post pacemaker placement.  Limitations: Edema, pain with compression. Comparison Study: Prior negative bilateral LEV 09/20/20 Performing Technologist: Alberta Lis RVS  Examination Guidelines: A complete evaluation includes B-mode imaging, spectral Doppler, color Doppler, and power Doppler as needed of all accessible  portions of each vessel. Bilateral testing is considered an integral part of a complete examination. Limited examinations for reoccurring indications may be performed as noted. The reflux portion of the exam is performed with the patient in reverse Trendelenburg.  +---------+---------------+---------+-----------+----------+--------------+ RIGHT    CompressibilityPhasicitySpontaneityPropertiesThrombus Aging +---------+---------------+---------+-----------+----------+--------------+ CFV      Full           Yes      Yes                                 +---------+---------------+---------+-----------+----------+--------------+ SFJ      Full                                                        +---------+---------------+---------+-----------+----------+--------------+ FV Prox  Full           Yes      Yes                                 +---------+---------------+---------+-----------+----------+--------------+ FV Mid   Full           Yes      Yes                                 +---------+---------------+---------+-----------+----------+--------------+ FV DistalFull           Yes      Yes                                 +---------+---------------+---------+-----------+----------+--------------+  PFV      Full           Yes      Yes                                 +---------+---------------+---------+-----------+----------+--------------+ POP      Full           Yes      Yes                                 +---------+---------------+---------+-----------+----------+--------------+ PTV      Full                                                        +---------+---------------+---------+-----------+----------+--------------+ PERO     Full                                                        +---------+---------------+---------+-----------+----------+--------------+   +---------+---------------+---------+-----------+----------+--------------+ LEFT      CompressibilityPhasicitySpontaneityPropertiesThrombus Aging +---------+---------------+---------+-----------+----------+--------------+ CFV      Full           Yes      Yes                                 +---------+---------------+---------+-----------+----------+--------------+ SFJ      Full                                                        +---------+---------------+---------+-----------+----------+--------------+ FV Prox  Full                                                        +---------+---------------+---------+-----------+----------+--------------+ FV Mid   Full           Yes      Yes                                 +---------+---------------+---------+-----------+----------+--------------+ FV DistalFull           Yes      Yes                                 +---------+---------------+---------+-----------+----------+--------------+ PFV      Full                                                        +---------+---------------+---------+-----------+----------+--------------+  POP      Full           Yes      Yes                                 +---------+---------------+---------+-----------+----------+--------------+ PTV      Full                                                        +---------+---------------+---------+-----------+----------+--------------+ PERO     Full                                                        +---------+---------------+---------+-----------+----------+--------------+     Summary: RIGHT: - There is no evidence of deep vein thrombosis in the lower extremity.  - No cystic structure found in the popliteal fossa.  LEFT: - There is no evidence of deep vein thrombosis in the lower extremity.  - No cystic structure found in the popliteal fossa.  *See table(s) above for measurements and observations. Electronically signed by Debby Robertson on 01/08/2024 at 9:58:40 AM.    Final    DG CHEST PORT 1  VIEW Result Date: 01/04/2024 CLINICAL DATA:  Shortness of breath.  Bradycardia. EXAM: PORTABLE CHEST 1 VIEW COMPARISON:  01/01/2024. FINDINGS: Stable cardiomediastinal contours. Stable dual lead left-sided pacemaker. Aortic atherosclerosis. Similar pulmonary vascular congestion. No focal consolidation, sizeable pleural effusion, or pneumothorax. No acute osseous abnormality. IMPRESSION: Mild cardiomegaly with pulmonary vascular congestion, similar to the prior exam. Electronically Signed   By: Harrietta Sherry M.D.   On: 01/04/2024 15:34   DG Chest 2 View Result Date: 01/01/2024 CLINICAL DATA:  Pacemaker placement. EXAM: CHEST - 2 VIEW COMPARISON:  12/29/2023 FINDINGS: The cardio pericardial silhouette is enlarged. Low volume film with vascular congestion and trace pleural effusions. Underlying component of interstitial edema not entirely excluded. Streaky density at the right base suggests atelectasis. Dual lead left-sided permanent pacemaker is new in the interval without evidence for pneumothorax. Telemetry leads overlie the chest. IMPRESSION: 1. Interval placement of left-sided permanent pacemaker without evidence for pneumothorax. 2. Low volume film with vascular congestion and trace pleural effusions. Electronically Signed   By: Camellia Candle M.D.   On: 01/01/2024 07:32   EP PPM/ICD IMPLANT Result Date: 12/31/2023 Summary: 1. Successful implant of a LEFT sided MDT DC/LBaP PPM implant Recommendations: 1. Routine post-procedure care with bedrest for 2 hours 2. No heparin  (IV or subcutaneous) for 48 hours. No enoxaparin  (IV or subcutaneous) for 7 days. 3. PA/lateral CXR in AM  4. Wound check in AM 5. Device interrogation in AM Donnice DELENA Primus, MD [facility] Cardiac Electrophysiology     DG CHEST PORT 1 VIEW Result Date: 12/29/2023 EXAM: 1 VIEW(S) XRAY OF THE CHEST 12/29/2023 10:40:00 AM COMPARISON: 12/28/2023 CLINICAL HISTORY: Temporary transvenous cardiac pacemaker present.  FINDINGS: LINES, TUBES AND DEVICES: Right IJ transvenous pacer lead in place with tip projecting over right ventricle. Defibrillator pads noted. LUNGS AND PLEURA: Mild pulmonary edema. No pleural effusion. No pneumothorax. HEART AND MEDIASTINUM: Stable cardiomediastinal contours with mild cardiomegaly. Aortic atherosclerosis. BONES AND  SOFT TISSUES: No acute osseous abnormality. IMPRESSION: 1. Mild pulmonary edema. 2. Stable cardiomediastinal contours with mild cardiomegaly, unchanged from 12/28/2023. 3. Right IJ transvenous pacer lead in place with tip projecting over the right ventricle; no pneumothorax. Electronically signed by: Rockey Kilts MD 12/29/2023 03:25 PM EST RP Workstation: [id number]   ECHOCARDIOGRAM COMPLETE Result Date: 12/29/2023    ECHOCARDIOGRAM REPORT   Patient Name:   KERIANN RANKIN Date of Exam: 12/29/2023 Medical Rec #:  [id number]    Height:       63.5 in Accession #:    [id number]   Weight:       127.4 lb Date of Birth:  December 15, 1945    BSA:          1.606 m Patient Age:    78 years     BP:           144/72 mmHg Patient Gender: F            HR:           71 bpm. Exam Location:  Inpatient Procedure: 3D Echo, 2D Echo, Color Doppler, Cardiac Doppler and Strain Analysis            (Both Spectral and Color Flow Doppler were utilized during            procedure). Indications:    Heart Block, 2nd Degree I44.1  History:        Patient has prior history of Echocardiogram examinations, most                 recent 06/25/2022. CHF and Noniscehmic Cardiomyaopathy, CAD; Risk                 Factors:Diabetes and Former Smoker. Breast Cancer.  Sonographer:    Logan Shove RDCS Referring Phys: [id number] BRIDGETTE CHRISTOPHER IMPRESSIONS  1. Left ventricular ejection fraction, by estimation, is 50 to 55%. The left ventricle has low normal function. The left ventricle demonstrates regional wall motion abnormalities (see scoring diagram/findings for description). There is mild left ventricular hypertrophy. Left  ventricular diastolic parameters are consistent with Grade III diastolic dysfunction (restrictive). Elevated left atrial pressure.  2. Right ventricular systolic function is normal. The right ventricular size is normal. There is mildly elevated pulmonary artery systolic pressure.  3. Left atrial size was moderately dilated.  4. The mitral valve is abnormal. Mild mitral valve regurgitation. No evidence of mitral stenosis.  5. The tricuspid valve is abnormal. Tricuspid valve regurgitation is moderate.  6. The aortic valve is tricuspid. Aortic valve regurgitation is not visualized. No aortic stenosis is present.  7. The inferior vena cava is normal in size with greater than 50% respiratory variability, suggesting right atrial pressure of 3 mmHg.  8. Small PFO with mild left to right shunting. FINDINGS  Left Ventricle: Mild apical hypokinesis. Left ventricular ejection fraction, by estimation, is 50 to 55%. The left ventricle has low normal function. The left ventricle demonstrates regional wall motion abnormalities. Strain was performed and the global  longitudinal strain is indeterminate. The left ventricular internal cavity size was normal in size. There is mild left ventricular hypertrophy. Left ventricular diastolic parameters are consistent with Grade III diastolic dysfunction (restrictive). Elevated left atrial pressure. Right Ventricle: The right ventricular size is normal. Right vetricular wall thickness was not well visualized. Right ventricular systolic function is normal. There is mildly elevated pulmonary artery systolic pressure. The tricuspid regurgitant velocity  is 2.89 m/s, and with an assumed right atrial pressure of  3 mmHg, the estimated right ventricular systolic pressure is 36.4 mmHg. Left Atrium: Left atrial size was moderately dilated. Right Atrium: Right atrial size was normal in size. Pericardium: There is no evidence of pericardial effusion. Mitral Valve: The mitral valve is abnormal. Mild  mitral valve regurgitation. No evidence of mitral valve stenosis. MV peak gradient, 8.9 mmHg. The mean mitral valve gradient is 3.0 mmHg. Tricuspid Valve: The tricuspid valve is abnormal. Tricuspid valve regurgitation is moderate . No evidence of tricuspid stenosis. Aortic Valve: The aortic valve is tricuspid. Aortic valve regurgitation is not visualized. No aortic stenosis is present. Aortic valve mean gradient measures 5.6 mmHg. Aortic valve peak gradient measures 9.7 mmHg. Aortic valve area, by VTI measures 2.04 cm. Pulmonic Valve: The pulmonic valve was not well visualized. Pulmonic valve regurgitation is not visualized. No evidence of pulmonic stenosis. Aorta: The aortic root and ascending aorta are structurally normal, with no evidence of dilitation. Venous: The inferior vena cava is normal in size with greater than 50% respiratory variability, suggesting right atrial pressure of 3 mmHg. IAS/Shunts: Small PFO with mild left to right shunting. Additional Comments: 3D was performed not requiring image post processing on an independent workstation and was normal.  LEFT VENTRICLE PLAX 2D LVIDd:         4.00 cm   Diastology LVIDs:         2.90 cm   LV e' medial:    4.24 cm/s LV PW:         1.20 cm   LV E/e' medial:  19.5 LV IVS:        1.10 cm   LV e' lateral:   7.40 cm/s LVOT diam:     2.00 cm   LV E/e' lateral: 11.2 LV SV:         59 LV SV Index:   37 LVOT Area:     3.14 cm LV IVRT:       198 msec                          3D Volume EF:                          3D EF:        53 %                          LV EDV:       126 ml                          LV ESV:       59 ml                          LV SV:        67 ml RIGHT VENTRICLE            IVC RV Basal diam:  4.20 cm    IVC diam: 1.80 cm RV Mid diam:    2.90 cm RV S prime:     7.18 cm/s  PULMONARY VEINS TAPSE (M-mode): 1.7 cm     Diastolic Velocity: 26.20 cm/s                            S/D Velocity:  2.30                            Systolic Velocity:   60.30 cm/s LEFT ATRIUM             Index        RIGHT ATRIUM           Index LA diam:        4.40 cm 2.74 cm/m   RA Area:     17.90 cm LA Vol (A2C):   92.3 ml 57.48 ml/m  RA Volume:   50.10 ml  31.20 ml/m LA Vol (A4C):   49.8 ml 31.01 ml/m LA Biplane Vol: 72.9 ml 45.39 ml/m  AORTIC VALVE AV Area (Vmax):    1.88 cm AV Area (Vmean):   1.86 cm AV Area (VTI):     2.04 cm AV Vmax:           156.05 cm/s AV Vmean:          110.759 cm/s AV VTI:            0.289 m AV Peak Grad:      9.7 mmHg AV Mean Grad:      5.6 mmHg LVOT Vmax:         93.56 cm/s LVOT Vmean:        65.593 cm/s LVOT VTI:          0.187 m LVOT/AV VTI ratio: 0.65  AORTA Ao Root diam: 2.80 cm Ao Asc diam:  2.60 cm MITRAL VALVE               TRICUSPID VALVE MV Area (PHT): 6.71 cm    TR Peak grad:   33.4 mmHg MV Peak grad:  8.9 mmHg    TR Vmax:        289.00 cm/s MV Mean grad:  3.0 mmHg MV Vmax:       1.49 m/s    SHUNTS MV Vmean:      78.0 cm/s   Systemic VTI:  0.19 m MV Decel Time: 113 msec    Systemic Diam: 2.00 cm MV E velocity: 82.80 cm/s MV A velocity: 45.10 cm/s MV E/A ratio:  1.84 Dorn Ross MD Electronically signed by Dorn Ross MD Signature Date/Time: 12/29/2023/1:02:08 PM    Final    CARDIAC CATHETERIZATION Result Date: 12/29/2023   Successful Percutaneous Temporary Pacemaker placement via right Internal Jugular Vein into the RV apex => marked at 39 cm at the catheter cover, rate set at 70 bpm, 5 MA. Threshold 0.4 mm Defer timing of CRT-D placement to EP service. Alm Clay, MD  CT PELVIS WO CONTRAST Result Date: 12/29/2023 EXAM: CT PELVIS, WITHOUT IV CONTRAST 12/28/2023 09:08:00 PM TECHNIQUE: Axial images were acquired through the pelvis without IV contrast. Reformatted images were reviewed. Automated exposure control, iterative reconstruction, and/or weight based adjustment of the mA/kV was utilized to reduce the radiation dose to as low as reasonably achievable. COMPARISON: None available. CLINICAL HISTORY: bilateral hip  pain post fall FINDINGS: BONES: A right inferior pubic ramus fracture is noted without significant displacement. No other pelvic fractures are noted. No evidence of acute proximal femoral fracture. JOINTS: No dislocation. SOFT TISSUES: No surrounding soft tissue abnormality is seen. Vascular calcifications of the aorta are noted. INTRAPELVIC CONTENTS: Visualized bowel is within normal limits. The bladder is within normal limits. IMPRESSION: 1. Right inferior pubic ramus fracture without significant displacement. No other pelvic fractures. 2. No acute  proximal femoral fracture. Electronically signed by: Oneil Devonshire MD 12/29/2023 12:51 AM EST RP Workstation: [id number]   CT LUMBAR SPINE WO CONTRAST Result Date: 12/28/2023 CLINICAL DATA:  Fall EXAM: CT LUMBAR SPINE WITHOUT CONTRAST TECHNIQUE: Multidetector CT imaging of the lumbar spine was performed without intravenous contrast administration. Multiplanar CT image reconstructions were also generated. RADIATION DOSE REDUCTION: This exam was performed according to the departmental dose-optimization program which includes automated exposure control, adjustment of the mA and/or kV according to patient size and/or use of iterative reconstruction technique. COMPARISON:  CT 02/27/2022 FINDINGS: Segmentation: 5 lumbar type vertebrae. Alignment: Normal. Vertebrae: Acute or subacute appearing fracture involving the superior endplate of L1 with shallow depression, estimated 10% loss of vertebral body height centrally. No significant retropulsion. Interval Schmorl's node and mild superior endplate deformity at L2. Probable acute fracture involving the superior endplate of L3 with estimated 20% loss of vertebral body height centrally. Minimal 2 mm retropulsion of the upper vertebral body, without significant canal stenosis. Fracture does not involve the pedicle or lamina. Paraspinal and other soft tissues: Suspicion of mild paravertebral edema at L3. Aortic atherosclerosis.  Disc levels: At L1-L2, patent disc space. No canal stenosis or high-grade foraminal narrowing. At L2-L3, patent disc space. No high-grade canal stenosis or foraminal narrowing. At L3-L4, patent disc space. Mild ligamentum flavum thickening and facet degenerative changes. No high-grade canal stenosis. Mild disc bulge. Patent foramen. At L4-L5, disc bulge. Ligamentum flavum thickening and moderate hypertrophic facet degenerative changes result in mild canal stenosis. No high-grade foraminal narrowing. At L5-S1, patent disc space. Diffuse disc bulge. Ligamentum flavum thickening and moderate facet degenerative changes. No high-grade canal stenosis or foraminal narrowing. IMPRESSION: 1. Acute or subacute appearing fracture involving the superior endplate of L1 with estimated 10% loss of vertebral body height centrally. No significant retropulsion. 2. Probable acute fracture involving the superior endplate of L3 with estimated 20% loss of vertebral body height centrally. Minimal 2 mm retropulsion of the upper vertebral body without significant canal stenosis. 3. Interval Schmorl's node and mild superior endplate deformity at L2. 4. Mild degenerative changes of the lumbar spine as described above. 5. Aortic atherosclerosis. Aortic Atherosclerosis (ICD10-I70.0). Electronically Signed   By: Luke Bun M.D.   On: 12/28/2023 21:45   CT CERVICAL SPINE WO CONTRAST Result Date: 12/28/2023 EXAM: CT Cervical Spine Without Contrast 12/28/2023 08:55:00 PM TECHNIQUE: CT of the cervical spine was performed without the administration of intravenous contrast. Multiplanar reformatted images are provided for review. Automated exposure control, iterative reconstruction, and/or weight based adjustment of the mA/kV was utilized to reduce the radiation dose to as low as reasonably achievable. COMPARISON: MRI cervical spine 09/28/2020 CLINICAL HISTORY: Fall FINDINGS: BONES AND ALIGNMENT: No acute fracture or traumatic malalignment.  Chronic degenerative anterolisthesis of C4 on C5. DEGENERATIVE CHANGES: Facet and uncovertebral hypertrophy with severe foraminal stenosis at multiple levels, better characterized on 2022 MRI. Degenerative disc disease is greatest at C5-C6. SOFT TISSUES: No prevertebral soft tissue swelling. IMPRESSION: 1. No acute abnormality of the cervical spine. Electronically signed by: Gilmore Molt MD 12/28/2023 09:40 PM EST RP Workstation: [id number]   CT Head Wo Contrast Result Date: 12/28/2023 EXAM: CT HEAD WITHOUT CONTRAST 12/28/2023 07:35:00 PM TECHNIQUE: CT of the head was performed without the administration of intravenous contrast. Automated exposure control, iterative reconstruction, and/or weight based adjustment of the mA/kV was utilized to reduce the radiation dose to as low as reasonably achievable. COMPARISON: 10/09/2022 CLINICAL HISTORY: Head trauma, minor (Age >= 65y) FINDINGS: BRAIN AND VENTRICLES: No  acute hemorrhage. No evidence of acute infarct. No mass effect or midline shift. Mild cerebral volume loss. Mild periventricular white matter disease. Bilateral basal ganglia calcifications. ORBITS: No acute abnormality. SINUSES: Mild mucosal disease within ethmoid air cells. SOFT TISSUES AND SKULL: No acute soft tissue abnormality. No skull fracture. VASCULATURE: Moderate calcific atheromatous disease within carotid siphons. IMPRESSION: 1. No acute intracranial abnormality related to head trauma. Electronically signed by: Oneil Devonshire MD 12/28/2023 08:05 PM EST RP Workstation: [id number]   DG Chest Port 1 View Result Date: 12/28/2023 EXAM: 1 VIEW(S) XRAY OF THE CHEST 12/28/2023 06:55:00 PM COMPARISON: 10/09/2022 CLINICAL HISTORY: dizziness FINDINGS: LINES, TUBES AND DEVICES: Multiple overlying monitor wires. LUNGS AND PLEURA: Pulmonary vascular congestion. Reticulonodular opacities in the left mid and lower lung suspicious for infection. No pulmonary edema. No pleural effusion. No pneumothorax. HEART AND  MEDIASTINUM: Mild cardiomegaly. BONES AND SOFT TISSUES: No acute osseous abnormality. IMPRESSION: 1. Left mid and lower lung reticulonodular opacities suspicious for infection. 2. Mild cardiomegaly with pulmonary vascular congestion. Electronically signed by: Norman Gatlin MD 12/28/2023 07:43 PM EST RP Workstation: [id number]    Labs:  Basic Metabolic Panel: Recent Labs  Lab 01/08/24 0515 01/10/24 0444 01/14/24 0437  NA 139 137 140  K 4.9 4.1 4.7  CL 106 104 106  CO2 23 25 23   GLUCOSE 72 172* 234*  BUN 50* 50* 53*  CREATININE 1.75* 1.76* 1.68*  CALCIUM  8.9 8.8* 9.3  MG  --  2.2  --     CBC: Recent Labs  Lab 01/08/24 0515 01/10/24 0444 01/14/24 0437  WBC 5.1 4.9 4.6  NEUTROABS 2.6  --   --   HGB 9.3* 9.3* 9.5*  HCT 27.8* 28.1* 28.7*  MCV 93.3 93.7 94.7  PLT 231 258 304    CBG: Recent Labs  Lab 01/07/24 1931 01/10/24 2052 01/11/24 1207 01/11/24 2024 01/12/24 0547  GLUCAP 206* 151* 81 151* 105*    Brief HPI:   [name] is a 78 y.o. female  R handed female with PMHx chronic systolic HF, CAD, CKD3B with baseline Cr  of ~1.7, HTN, HLD, type 2 DM, tobacco use, breast cancer s/p lumpectomy/radiation and chemotherapy. The patient [facility], via EMS after experiencing a witnessed syncopal episode by the patient's daughter. It was reported that the patient hit their head on the refrigerator. The patient complained of increased shortness of breath on exertion and right hip pain, along with the sudden onset of nausea, vomiting, and diarrhea. Upon EMS arrival, the patient's heart rate was noted to be in the 30s, atropine was administered en route to the ED. CT head was unremarkable. A CT of the pelvis revealed a right inferior pubic rami fracture without displacement. patient was afebrile and hemodynamically stable.  Labs without any significant electrolyte abnormalities.  AKI noted with creatinine 2.56 elevated from baseline 1.7, Elevated BNP  2500, Troponin 120-130, mildly elevated AST/ALT at 229 and 186. Vital signs significant for bradycardia heart rate in the 30s.  An EKG showed a 2:1 AV block, and cardiology was consulted. Carvedilol  was washed out due to bradycardia, and glucagon  was initiated. The patient was admitted to the ICU, bradycardia continued and the patient underwent temporary pacer placement on 11/8 and permanent pacemaker placement on 11/10. Heparin  was started for DVT prophylaxis, and Lipitor was held due to abnormal liver function tests. The patient was followed for acute on chronic systolic CHF, and Entresto  and SGLT2 were held. The patient was started on irbesartan .  Potassium levels were borderline high, and the patient received lokelma , with levels now stable. The patient appeared euvolemic and was started on torsemide . For the right inferior femur ramus fracture, orthopedics recommended conservative management, weight-bearing as tolerated. The patient's A1c was 8.4, and Lantus  was resumed with an increased dose. Prior to arrival the patient was independent at baseline using RW intermittently.  On chart review she lives with her son in a 1 level home.  Currently mod to max assist with mobility and basic ADLs. Therapy evaluations completed due to patient decreased functional mobility was admitted for a comprehensive rehab program.     Inpatient Rehabilitation Course: [name] was admitted to rehab 01/05/2024 for inpatient therapies to consist of PT, ST and OT at least three hours five days a week. Past admission physiatrist, therapy team and rehab RN have worked together to provide customized collaborative inpatient rehab.  Anticoagulation: Lovenox . DVT study on 11/17 with negative results.   Pain Management: Continue Oxycodone , Tylenol  and lidocaine  patch prn.   Mood/Behavior/Sleep: Melatonin prn for sleep.   Skin/Wound Care: Wound care/signs and symptoms of infection discussed at discharge. Left chest PPM insertion  sight stable. ROM limitations reiterated by EP, PA.    Fluid/Nutrition/Electrolytes: Monitor I&O and weight, continue vitamins/supplements.   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 Rate stable.     Acute/Chronic systolic CHF: hx of Nonischemic Cardiomyopathy-Holding Entresto  and SGLT2 due to AKI on CKD3B. Continue Irbesartan  300mg  daily and Torsemide  20mg  daily.    AKI on CKD IIIB: on Torsemide , Cr reviewed and is improved. Follow up with PCP   T2DM: uncontrolled, A1C 8.1, continue home Lantus  20U daily. Jardience was not resumed, follow up outpatient.    Right Inferior Pubic Rami Fx: conservative management, weightbearing as tolerated.   HLD: continue Lipitor 40 mg   Hyperkalemia: potassium reviewed and has normalized   Anemia-chronic: monitored and stable.   Constipation: continue Senna 8.6mg  daily. Diarrhea, changed sennaK to prn and discontinued magnesium .   Planned Outpatient Follow-Up:  -Cardiology -PCP   Rehab course: During patient's stay in rehab weekly team conferences were held to monitor patient's progress, set goals and discuss barriers to discharge. At admission, patient required mod to max assist with mobility and basic ADLs.    Occupational Therapy: Patient has met all long term goals due to improved activity tolerance, improved balance, and improved coordination.  Patient to discharge at overall modified independent, with good understanding and adherence to precautions. He/She will benefit from ongoing OT in outpatient setting to continue to advance functional skills in the area of BADL.   Physical Therapy: Patient has met all long term goals due to improved activity tolerance, improved balance, improved postural control, increased strength, decreased pain, ability to compensate for deficits, improved attention, improved awareness, and improved coordination.  Patient to discharge at an ambulatory level modified independent  using rollator.  He/She will benefit from ongoing skilled PT services in outpatient setting to continue to advance safe functional mobility, address ongoing impairments in strength, ROM, balance, endurance, gait, and minimize fall risk.    Discharge plan was discussed with patients daughter via telephone, she verbalized understanding and agreed with plan.      Disposition:  Discharge disposition: 06-Home-Health Care Svc        Diet: Heart Healthy   Special Instructions:  -Left arm restrictions for PPM provided.   -No driving or operating heavy machinery until cleared by provider  -No smoking or alcohol or  illicit drug use     Allergies as of 01/14/2024       Reactions   Pregabalin    Other Reaction(s): feeling fatigue drunk feeling, falls   Tape Rash   Pt can tolerate the paper tape        Medication List     TAKE these medications    acetaminophen  325 MG tablet Commonly known as: TYLENOL  Take 1-2 tablets (325-650 mg total) by mouth every 4 (four) hours as needed for mild pain (pain score 1-3).   atorvastatin  40 MG tablet Commonly known as: LIPITOR Take 1 tablet (40 mg total) by mouth daily.   bisacodyl  5 MG EC tablet Commonly known as: DULCOLAX Take 1 tablet (5 mg total) by mouth daily as needed for moderate constipation.   carvedilol  3.125 MG tablet Commonly known as: COREG  Take 1 tablet (3.125 mg total) by mouth 2 (two) times daily with a meal.   FreeStyle Libre 3 Sensor Misc See admin instructions.   irbesartan  300 MG tablet Commonly known as: AVAPRO  Take 1 tablet (300 mg total) by mouth daily.   Lantus  SoloStar 100 UNIT/ML Solostar Pen Generic drug: insulin  glargine Inject 20 Units into the skin in the morning.   magnesium  oxide 400 (240 Mg) MG tablet Commonly known as: MAG-OX Take 0.5 tablets (200 mg total) by mouth daily. Start taking on: January 15, 2024   melatonin 3 MG Tabs tablet Take 1 tablet (3 mg total) by mouth at bedtime.    olopatadine  0.1 % ophthalmic solution Commonly known as: PATANOL Place 1 drop into both eyes 2 (two) times daily as needed (itching). What changed:  medication strength when to take this reasons to take this   senna 8.6 MG Tabs tablet Commonly known as: SENOKOT Take 1 tablet (8.6 mg total) by mouth daily as needed for mild constipation. What changed:  when to take this reasons to take this   torsemide  20 MG tablet Commonly known as: DEMADEX  Take 1 tablet (20 mg total) by mouth daily.        Follow-up Information     Koirala, Dibas, MD Follow up.   Specialty: Family Medicine Why: Call for an appointment with in 1-2 weeks. Contact information: [address] [place] [address] [phone]                 Signed: Daphne LOISE Cardenas 01/14/2024, 5:05 PM

## 2024-01-14 NOTE — Progress Notes (Signed)
 Occupational Therapy Discharge Summary  Patient Details  Name: [name] MRN: [id number] Date of Birth: 06-10-1945  Date of Discharge from OT service:{Time; dates multiple:304500300}  {CHL IP REHAB OT TIME CALCULATIONS:304400400}   Patient has met {NUMBERS 0-12:18577} of {NUMBERS 0-12:18577} long term goals due to {due un:6958348}.  Patient to discharge at overall {LOA:3049010} level.  Patient's care partner {care partner:3041650} to provide the necessary {assistance:3041652} assistance at discharge.    Reasons goals not met: ***  Recommendation:  Patient will benefit from ongoing skilled OT services in {setting:3041680} to continue to advance functional skills in the area of {ADL/iADL:3041649}.  Equipment: {equipment:3041657}  Reasons for discharge: {Reason for discharge:3049018}  Patient/family agrees with progress made and goals achieved: {Pt/Family agree with progress/goals:3049020}  OT Discharge Precautions/Restrictions    General   Vital Signs Therapy Vitals Temp: 98 F (36.7 C) Temp Source: Oral Pulse Rate: 62 Resp: 16 BP: 120/70 Patient Position (if appropriate): Lying Oxygen Therapy SpO2: 99 % O2 Device: Room Air Pain   ADL ADL Eating: Modified independent Grooming: Modified independent Where Assessed-Grooming: Standing at sink Upper Body Bathing: Supervision/safety Where Assessed-Upper Body Bathing: Shower Lower Body Bathing: Supervision/safety Where Assessed-Lower Body Bathing: Shower Upper Body Dressing: Modified independent (Device) Where Assessed-Upper Body Dressing: Edge of bed Lower Body Dressing: Modified independent Where Assessed-Lower Body Dressing: Edge of bed Toileting: Modified independent Where Assessed-Toileting: Teacher, Adult Education: Engineer, Agricultural Method: Proofreader: Gaffer Method: Unable to Engineer, Technical Sales: Close  supervision Film/video Editor Method: Ambulating Vision Baseline Vision/History: 6 Macular Degeneration Patient Visual Report: No change from baseline Perception  Perception: Within Functional Limits Praxis Praxis: WFL Cognition Cognition Overall Cognitive Status: Within Functional Limits for tasks assessed Brief Interview for Mental Status (BIMS) Repetition of Three Words (First Attempt): 3 Temporal Orientation: Year: Correct Temporal Orientation: Month: Accurate within 5 days Temporal Orientation: Day: Correct Recall: Sock: Yes, no cue required Recall: Blue: Yes, no cue required Recall: Bed: Yes, no cue required BIMS Summary Score: 15 Sensation Sensation Light Touch: Impaired Detail Peripheral sensation comments: baseline neuropathy Light Touch Impaired Details: Impaired LLE;Impaired RLE Additional Comments: 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 Coordination Gross Motor Movements are Fluid and Coordinated: No Fine Motor Movements are Fluid and Coordinated: No Motor  Motor Motor: Within Functional Limits Motor - Discharge Observations: impaired motor function d/t pain/stiffness from fxt Mobility  Bed Mobility Bed Mobility: Sit to Supine;Supine to Sit Supine to Sit: Independent with assistive device Sit to Supine: Independent with assistive device Transfers Sit to Stand: Independent with assistive device Stand to Sit: Independent with assistive device  Trunk/Postural Assessment  Cervical Assessment Cervical Assessment: Exceptions to [facility] (mild forward head) Thoracic Assessment Thoracic Assessment: Within Functional Limits Lumbar Assessment Lumbar Assessment: Exceptions to [facility] (mild posterior pelvic tilt) Postural Control Postural Control: Within Functional Limits  Balance Static Sitting Balance Static Sitting - Balance Support: Feet supported;Right upper extremity supported Static Sitting - Level of Assistance: 7: Independent Dynamic Sitting  Balance Dynamic Sitting - Balance Support: Feet unsupported Dynamic Sitting - Level of Assistance: 7: Independent Static Standing Balance Static Standing - Balance Support: Right upper extremity supported;Left upper extremity supported Static Standing - Level of Assistance: 7: Independent Dynamic Standing Balance Dynamic Standing - Balance Support: Left upper extremity supported;Right upper extremity supported Dynamic Standing - Level of Assistance: 6: Modified independent (Device/Increase time) Extremity/Trunk Assessment RUE Assessment RUE Assessment: Within Functional Limits Active Range of Motion (AROM) Comments: WFL General Strength Comments: 4+/5  LUE Assessment LUE Assessment: Within Functional Limits Active Range of Motion (AROM) Comments: WFL within precautions General Strength Comments: 4/5   [name] 01/14/2024, 9:23 AM

## 2024-01-14 NOTE — Telephone Encounter (Signed)
 Spoke w/ Prentice Passey, PA, regarding patient missing wound check appointment d/t hospitalization. Patient to be seen by Prentice Passey, PA, for wound check in hospital and [employer] to check device today before potential discharge tomorrow 01/15/2024.   Patient to F/U w/ Dr. Almetta for 90 day post [facility] Implant appointment per Prentice Passey, PA.   Will continue to monitor and update accordingly.

## 2024-01-14 NOTE — Progress Notes (Addendum)
  PPM wound site stable. Excess dermabond removed but some left in place. Pt is fine to shower and GENTLY scrub the area to remove excess.   Full interrogation stable and to be sent to device clinic for records. OK to keep appt in February with Dr. Almetta.    Reviewed on-going arm-restrictions from PPM. 4 more weeks of no heavy lifting or extensive ROM with her left arm. Specifically lifting above the head or activity behind the back, as well as nothing heavier than 5-8 lbs in the left arm.   Ozell Jodie Passey, PA-C  01/14/2024 10:31 AM

## 2024-01-14 NOTE — Progress Notes (Signed)
 Occupational Therapy Session Note  Patient Details  Name: [name] MRN: [id number] Date of Birth: [birth year]  Today's Date: 01/14/2024 OT Individual Time: 0850-0932+1048-1200  OT Individual Time Calculation (min): 42 min    Short Term Goals: Week 1:  OT Short Term Goal 1 (Week 1): patient will complete LB dressing min A with LRAD OT Short Term Goal 2 (Week 1): patient will complete functional toilet transfer with min A OT Short Term Goal 3 (Week 1): patient will tolerate standing 45 seconds with o2 within precaution range and good balance to increase ability to complete standing ADL tasks  Skilled Therapeutic Interventions/Progress Updates:  Session 1: Pt greeted supine in bed, pt agreeable to OT intervention.    No pain reported during session.   Transfers/bed mobility/functional mobility:  Pt completed supine>sit MODI. Pt completed sit>stands with rollator MODI.   ADLs:  Grooming: pt completed standing oral care at sink MODI UB dressing: pt donned OH shirt MODI from EOB LB dressing: pt donned pants/brief with supervision from EOB Footwear: pt donned diabetic shoes from EOB via figure 4 with set- up assist.                 Ended session with pt seated in recliner with all needs within reach.     Session 2: Pt greeted seated in recliner, pt agreeable to OT intervention.    No pain reported.   Transfers/bed mobility/functional mobility:  Pt competed all functional ambulation with rollator MODI.  Pt completed simulated IADL task in gym with pt instructed to ambulate around obstacles to transport items from one surface to the other. Pt completed task with rollator MODI.   Pt completed functional ambulation in apt with pt able to demo ambulatory transfers to recliner, couch and flat HOB, MODI.  Pt also practiced stepping up/down onto 4 inch step to simulate garage entrance with unilateral support on R side. Pt completed task with CGA, pt completed 2 trials.   Exercises:   Issued pt HEP as indicated below:   Access Code: [email or web address] URL: [email or web address] Date: 01/14/2024 Prepared by:   Program Notes **Be sure to perform all exercises next to a countertop or sturdy piece of furniture in case you become unsteady.  Exercises - Standing Heel Raise with Support  - 1 x daily - 7 x weekly - 2 sets - 15 reps - Mini Squat with Counter Support  - 1 x daily - 7 x weekly - 2 sets - 15 reps - Standing Hip Abduction with Counter Support  - 1 x daily - 7 x weekly - 3 sets - 10 reps - Sit to Stand  - 1 x daily - 7 x weekly - 3 sets - 10 reps - Standing March with Counter Support  - 1 x daily - 7 x weekly - 3 sets - 10 reps - Standing Hip Extension with Counter Support  - 1 x daily - 7 x weekly - 3 sets - 10 reps     Ended session with pt seated in recliner with all needs within reach.   Therapy Documentation Precautions:  Precautions Precautions: ICD/Pacemaker Recall of Precautions/Restrictions: Intact Precaution/Restrictions Comments: pacemaker Restrictions Weight Bearing Restrictions Per Provider Order: Yes LUE Weight Bearing Per Provider Order: Non weight bearing RLE Weight Bearing Per Provider Order: Weight bearing as tolerated Other Position/Activity Restrictions: LUE limited wt bearing and ROM post PPM    Therapy/Group: Individual Therapy  [name] [facility] 01/14/2024, 12:03 PM

## 2024-01-14 NOTE — Progress Notes (Signed)
 PROGRESS NOTE   Subjective/Complaints: Discussed Cbgs She is feeling much better Heels look excellent No issues overnight  ROS: as per HPI. Denies CP, SOB, abd pain +constipation-now diarrhea, +pain from prevalon boots  Objective:   No results found.   Recent Labs    01/14/24 0437  WBC 4.6  HGB 9.5*  HCT 28.7*  PLT 304    Recent Labs    01/14/24 0437  NA 140  K 4.7  CL 106  CO2 23  GLUCOSE 234*  BUN 53*  CREATININE 1.68*  CALCIUM  9.3         Intake/Output Summary (Last 24 hours) at 01/14/2024 0933 Last data filed at 01/14/2024 0859 Gross per 24 hour  Intake 240 ml  Output --  Net 240 ml     Wound 01/05/24 1700 Pressure Injury Heel Left Deep Tissue Pressure Injury - Purple or maroon localized area of discolored intact skin or blood-filled blister due to damage of underlying soft tissue from pressure and/or shear. (Active)     Wound 01/05/24 1700 Pressure Injury Heel Right Deep Tissue Pressure Injury - Purple or maroon localized area of discolored intact skin or blood-filled blister due to damage of underlying soft tissue from pressure and/or shear. (Active)    Physical Exam: Vital Signs Blood pressure 120/70, pulse 62, temperature 98 F (36.7 C), temperature source Oral, resp. rate 16, height 5' 3.5 (1.613 m), weight 59.6 kg, SpO2 99%.  Constitutional: No distress . Vital signs reviewed. Awake, alert, appropriate. Sitting up in bed HEENT: NCAT, EOMI, oral membranes a little dry. Blind in L eye.  Neck: supple Cardiovascular: RRR without m/r/g appreciated. No JVD    Respiratory/Chest: CTA Bilaterally without wheezes or rales. Normal effort    GI/Abdomen: soft, +BS throughout, non-tender, non-distended Ext: no clubbing, cyanosis, or edema Psych: pleasant and cooperative Skin: dry, warm  Musculoskeletal:     Cervical back: Neck supple.     Comments: Ue's 5-/5 except FA 4/5 on R and 4+/5 on  L RLE- HF 3/5 limited due to pain; otherwise KE/KF 3+/5; and DF 4/5 and PF 4+/5 LLE- HF 3-/5; otherwise the same in KE/KF/DF/PF  as RLE, stable 11/24   TTP around R hip- no incision  Skin:    General: Skin is warm and dry.     Comments: R forearm IV- looks OK Skin dry- flaking on LE's  Neurological:     Mental Status: She is alert and oriented to person, place, and time.     Comments: Intact to light touch in LE's- except feet decreased- from her chronic neuropathy  Psychiatric:        Mood and Affect: Mood normal.       Assessment/Plan: 1. Functional deficits which require 3+ hours per day of interdisciplinary therapy in a comprehensive inpatient rehab setting. Physiatrist is providing close team supervision and 24 hour management of active medical problems listed below. Physiatrist and rehab team continue to assess barriers to discharge/monitor patient progress toward functional and medical goals  Care Tool:  Bathing    Body parts bathed by patient: Right arm, Left arm, Face, Chest, Abdomen, Front perineal area, Buttocks, Right upper leg, Left upper leg,  Right lower leg, Left lower leg   Body parts bathed by helper: Front perineal area, Buttocks     Bathing assist Assist Level: Minimal Assistance - Patient > 75%     Upper Body Dressing/Undressing Upper body dressing   What is the patient wearing?: Pull over shirt    Upper body assist Assist Level: Set up assist    Lower Body Dressing/Undressing Lower body dressing      What is the patient wearing?: Pants     Lower body assist Assist for lower body dressing: Moderate Assistance - Patient 50 - 74%     Toileting Toileting    Toileting assist Assist for toileting: Moderate Assistance - Patient 50 - 74%     Transfers Chair/bed transfer  Transfers assist  Chair/bed transfer activity did not occur: Safety/medical concerns  Chair/bed transfer assist level: Supervision/Verbal cueing      Locomotion Ambulation   Ambulation assist      Assist level: Supervision/Verbal cueing Assistive device: Rollator Max distance: 150+   Walk 10 feet activity   Assist     Assist level: Supervision/Verbal cueing Assistive device: Rollator   Walk 50 feet activity   Assist Walk 50 feet with 2 turns activity did not occur: Safety/medical concerns  Assist level: Supervision/Verbal cueing Assistive device: Rollator    Walk 150 feet activity   Assist Walk 150 feet activity did not occur: Safety/medical concerns  Assist level: Supervision/Verbal cueing Assistive device: Rollator    Walk 10 feet on uneven surface  activity   Assist Walk 10 feet on uneven surfaces activity did not occur: Safety/medical concerns   Assist level: Contact Guard/Touching assist (ramp) Assistive device: Rollator   Wheelchair     Assist Is the patient using a wheelchair?: No Type of Wheelchair: Manual Wheelchair activity did not occur: N/A  Wheelchair assist level: Dependent - Patient 0% Max wheelchair distance: 150    Wheelchair 50 feet with 2 turns activity    Assist    Wheelchair 50 feet with 2 turns activity did not occur: N/A   Assist Level: Dependent - Patient 0%   Wheelchair 150 feet activity     Assist  Wheelchair 150 feet activity did not occur: N/A   Assist Level: Dependent - Patient 0%   Blood pressure 120/70, pulse 62, temperature 98 F (36.7 C), temperature source Oral, resp. rate 16, height 5' 3.5 (1.613 m), weight 59.6 kg, SpO2 99%.  Medical Problem List and Plan: 1. Functional deficits secondary to Debility from  Acute on chronic CHF exacerbation after syncope with AV block/pacemaker placement             -patient may  shower- pacemaker placed 11/10             -ELOS/Goals: 14-16 days- mod I to supervision  Discussed plan for d/c tomorrow   2.  Antithrombotics: -DVT/anticoagulation:  Mechanical: Sequential compression devices, below knee  Bilateral lower extremities -Pharmaceutical: Lovenox  30mg  daily start in CIR, continue             -antiplatelet therapy: N/A -Venous Duplex pending.    3. 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 continue Oxycodone , Tylenol  and lidocaine  patch prn.    4. Mood/Behavior/Sleep: LCSW to follow for evaluation and support when available.              -antipsychotic agents: n/a             -sleep: continue melatonin 5 mg , trazodone  PRN   5.  Neuropsych/cognition: This patient is capable of making decisions on her own behalf.   6. Skin/Wound Care: Routine pressure relief measures. Monitor chest surgical site for infection.    7. Fluids/Electrolytes/Nutrition: Monitor I&O and weight, continue vitamins/supplements. Follow up labs CBC/CMP     8. 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   -HR stable, monitor   9. Acute/Chronic systolic CHF: hx of Nonischemic Cardiomyopathy-Holding Entresto  and SGLT2 due to AKI on CKD3B. Continue Irbesartan  300mg  daily and Torsemide  20mg  daily. Weight reviewed 11/23 and increased slightly but euvolemic on exam, suspect bed weight discrepancy Filed Weights   01/12/24 0500 01/13/24 0400 01/14/24 0500  Weight: 59 kg 60 kg 59.6 kg      10. AKI on CKD IIIB: on Torsemide , Cr reviewed and is improved   11. T2DM: uncontrolled, A1C 8.1, Lantus  20U daily resumed, Jardience to be resumed when mobility improves. d/c ISS, continue Lantus  20U daily Recent Labs    01/11/24 1207 01/11/24 2024 01/12/24 0547  GLUCAP 81 151* 105*      12. Right Inferior Pubic Rami Fx: conservative management, weightbearing as tolerated.    13. HLD: continue Lipitor 40 mg    14. Constipation: continue Senna 8.6mg  daily.  LBM 11/19, continue magnesium  supplement, magnesium  level reviewed and is 2.2 -01/12/24 LBM this AM per pt, not documented, might have been yesterday? Monitor.  -01/13/24 diarrhea all night, change senna to PRN, might  need to d/c Mg if continued  15. Hyperkalemia: potassium reviewed and has normalized  16. Anemia-chronic: stable, asked nursing to please obtain stool occult  17. Polypharmacy: d/c oxycodone , vitamin C  supplement, miralax     LOS: 9 days A FACE TO FACE EVALUATION WAS PERFORMED  Sven SQUIBB Johnnie Goynes 01/14/2024, 9:33 AM

## 2024-01-14 NOTE — Discharge Instructions (Addendum)
 Inpatient Rehab Discharge Instructions  [name]  Discharge date and time: 01/15/2024  Activities/Precautions/ Functional Status:  Activity: activity as tolerated  Diet: cardiac diet  Wound Care: keep wound clean and dry and as directed   Functional status:  ___ No restrictions     ___ Walk up steps independently _X_ 24/7 supervision/assistance   ___ Walk up steps with assistance ___ Intermittent supervision/assistance  ___ Bathe/dress independently ___ Walk with walker     ___ Bathe/dress with assistance ___ Walk Independently    ___ Shower independently ___ Walk with assistance    ___ Shower with assistance __X_ No alcohol     ___ Return to work/school ________  Special Instructions:  ---Continue Arm-restrictions from pacemaker site. 4 more weeks of no heavy lifting or extensive ROM with her left arm. Specifically lifting above the head or activity behind the back, as well as nothing heavier than 5-8 lbs in the left arm.  COMMUNITY REFERRALS UPON DISCHARGE:    Home Health:   PT     OT           Agency: [facility]    Phone: [phone] *Please expect follow-up within 2-3 business days for discharge to schedule your home visit. If you have not received follow-up, be sure to contact the site directly.*   Medical Equipment/Items Ordered: 3-1 commode                                                  Agency/Supplier: [facility]   My questions have been answered and I understand these instructions. I will adhere to these goals and the provided educational materials after my discharge from the hospital.  Patient/Caregiver Signature _______________________________ Date __________  Clinician Signature _______________________________________ Date __________  Please bring this form and your medication list with you to all your follow-up doctor's appointments.

## 2024-01-14 NOTE — Plan of Care (Signed)
  Problem: Consults Goal: RH GENERAL PATIENT EDUCATION Description: See Patient Education module for education specifics. Outcome: Progressing Goal: Skin Care Protocol Initiated - if Braden Score 18 or less Description: If consults are not indicated, leave blank or document N/A Outcome: Progressing Goal: Diabetes Guidelines if Diabetic/Glucose > 140 Description: If diabetic or lab glucose is > 140 mg/dl - Initiate Diabetes/Hyperglycemia Guidelines & Document Interventions  Outcome: Progressing   Problem: RH BOWEL ELIMINATION Goal: RH STG MANAGE BOWEL WITH ASSISTANCE Description: STG Manage Bowel with mod I Assistance. Outcome: Progressing Goal: RH STG MANAGE BOWEL W/MEDICATION W/ASSISTANCE Description: STG Manage Bowel with Medication with Mod I Assistance. Outcome: Progressing

## 2024-01-14 NOTE — Progress Notes (Signed)
 Physical Therapy Session Note  Patient Details  Name: [name] MRN: [id number] Date of Birth: [birth year]  Today's Date: 01/14/2024 PT Individual Time: 1417-1530 PT Individual Time Calculation (min): 73 min   Short Term Goals: Week 1:  PT Short Term Goal 1 (Week 1): Pt will increase bed mobility to contact gaurd. PT Short Term Goal 2 (Week 1): Pt will increase sit to stand transfers with LRAD to contact gaurd. PT Short Term Goal 3 (Week 1): Pt will increase transfers with LRAD to min A PT Short Term Goal 4 (Week 1): Pt will ascend/descend 1 step with hemiwalker and mod A. PT Short Term Goal 5 (Week 1): Pt will ambulate about 50 feet with LRAD and min A.  Skilled Therapeutic Interventions/Progress Updates: Pt presents sitting in recliner and agreeable to therapy, although c/o R sided pelvis pain 2/2 prior therapy.  Pt transfers sit to stand w/ mod I to rollator and unlocks for progression to gait.  Pt amb > 400' w/ rollator and then positions along wall for resting on seat.  Pt manages all brakes and positioning.  Pt amb w/ mod I.  Pt amb multiple trials of > 150' distance.  Pt negotiates 12 steps w/ 2 rails and supervision, cues for sequence for step-to pattern.  Pt descends steps slower 2/2 visual deficits and FOF.  Pt attempted using 1 rail w/ BUES and completed but does not feel comfortable performing.  Pt transfers in and out of passenger car height simulation w/ supervision, manual A for bringing LES n, although stating that's how I did it before.  Pt returned to room and transferred stand to sit in recliner.  Pt given fresh water and remained sitting w/ all needs in reach.     Therapy Documentation Precautions:  Precautions Precautions: ICD/Pacemaker Recall of Precautions/Restrictions: Intact Precaution/Restrictions Comments: pacemaker Restrictions Weight Bearing Restrictions Per Provider Order: Yes LUE Weight Bearing Per Provider Order: Non weight bearing RLE Weight Bearing  Per Provider Order: Weight bearing as tolerated Other Position/Activity Restrictions: LUE limited wt bearing and ROM post PPM General:   Vital Signs: Therapy Vitals Temp: 97.7 F (36.5 C) Temp Source: Oral Pulse Rate: 66 Resp: 16 BP: (!) 151/78 Patient Position (if appropriate): Sitting Oxygen Therapy SpO2: 100 % O2 Device: Room Air Pain:4/10 initially, but decreased w/ activity to 0/10 Pain Assessment Pain Scale: 0-10 Pain Score: 4  Pain Location: Pelvis Pain Orientation: Right Pain Descriptors / Indicators: Aching   Therapy/Group: Individual Therapy  Cap Massi P Kaiyan Luczak 01/14/2024, 3:54 PM

## 2024-01-15 ENCOUNTER — Other Ambulatory Visit (HOSPITAL_COMMUNITY): Payer: Self-pay

## 2024-01-15 LAB — GLUCOSE, CAPILLARY: Glucose-Capillary: 151 mg/dL — ABNORMAL HIGH (ref 70–99)

## 2024-01-15 MED ORDER — INSULIN GLARGINE-YFGN 100 UNIT/ML ~~LOC~~ SOLN
22.0000 [IU] | Freq: Every day | SUBCUTANEOUS | Status: DC
  Filled 2024-01-15: qty 0.22

## 2024-01-15 MED ORDER — INSULIN GLARGINE 100 UNIT/ML SOLOSTAR PEN
22.0000 [IU] | PEN_INJECTOR | Freq: Every day | SUBCUTANEOUS | 11 refills | Status: DC
  Filled 2024-01-15: qty 15, 68d supply, fill #0

## 2024-01-15 NOTE — Plan of Care (Signed)
  Problem: RH Balance Goal: LTG: Patient will maintain dynamic sitting balance (OT) Description: LTG:  Patient will maintain dynamic sitting balance with assistance during activities of daily living (OT) Outcome: Completed/Met Goal: LTG Patient will maintain dynamic standing with ADLs (OT) Description: LTG:  Patient will maintain dynamic standing balance with assist during activities of daily living (OT)  Outcome: Completed/Met   Problem: Sit to Stand Goal: LTG:  Patient will perform sit to stand in prep for activites of daily living with assistance level (OT) Description: LTG:  Patient will perform sit to stand in prep for activites of daily living with assistance level (OT) Outcome: Completed/Met   Problem: RH Grooming Goal: LTG Patient will perform grooming w/assist,cues/equip (OT) Description: LTG: Patient will perform grooming with assist, with/without cues using equipment (OT) Outcome: Completed/Met   Problem: RH Bathing Goal: LTG Patient will bathe all body parts with assist levels (OT) Description: LTG: Patient will bathe all body parts with assist levels (OT) Outcome: Completed/Met   Problem: RH Dressing Goal: LTG Patient will perform upper body dressing (OT) Description: LTG Patient will perform upper body dressing with assist, with/without cues (OT). Outcome: Completed/Met Goal: LTG Patient will perform lower body dressing w/assist (OT) Description: LTG: Patient will perform lower body dressing with assist, with/without cues in positioning using equipment (OT) Outcome: Completed/Met   Problem: RH Toileting Goal: LTG Patient will perform toileting task (3/3 steps) with assistance level (OT) Description: LTG: Patient will perform toileting task (3/3 steps) with assistance level (OT)  Outcome: Completed/Met

## 2024-01-15 NOTE — Progress Notes (Addendum)
 Inpatient Rehabilitation Care Coordinator Discharge Note   Patient Details  Name: [name] MRN: [id number] Date of Birth: September 16, 1945   Discharge location: Home with son and daughter providing support  Length of Stay: 9 days  Discharge activity level: Independent with assistive device  Home/community participation: Limited in the community  Patient response un:Yzjouy Literacy - How often do you need to have someone help you when you read instructions, pamphlets, or other written material from your doctor or pharmacy?: Rarely (Has some bouts of confusion but is able to process information slowly)  Patient response un:Dnrpjo Isolation - How often do you feel lonely or isolated from those around you?: Never  Services provided included: MD, RD, PT, OT, SLP, RN, CM, TR, Pharmacy, SW  Financial Services:  Financial Services Utilized: Medicare    Choices offered to/list presented to: Patient/Family  Follow-up services arranged:  Home Health Home Health Agency: [facility]         Patient response to transportation need: Is the patient able to respond to transportation needs?: Yes In the past 12 months, has lack of transportation kept you from medical appointments or from getting medications?: No In the past 12 months, has lack of transportation kept you from meetings, work, or from getting things needed for daily living?: No   Patient/Family verbalized understanding of follow-up arrangements:  Yes  Individual responsible for coordination of the follow-up plan: Family  Confirmed correct DME delivered: [employer]  Loreli 01/15/2024    Comments (or additional information):Patient to discharge home with family prividing 24/7 care and support. Ordered 3-1 commode via [employer] for daughter to pick up.   Summary of Stay    Date/Time Discharge Planning CSW  01/08/24 1542 Will discharge home with son and daughter providing care during the day and night. Will await therapy follow-up  recommendations DS       [employer]  Loreli

## 2024-01-15 NOTE — Progress Notes (Signed)
 Inpatient Rehabilitation Discharge Medication Review by a Pharmacist  A complete drug regimen review was completed for this patient to identify any potential clinically significant medication issues.  High Risk Drug Classes Is patient taking? Indication by Medication  Antipsychotic No   Anticoagulant No   Antibiotic No   Opioid No   Antiplatelet No   Hypoglycemics/insulin  Yes Lantus  insulin  - DM II  Vasoactive Medication Yes Irbersartan, carvedilol - HTN  Chemotherapy No   Other Yes Acetaminophen - pain Atorvastatin  - HLD Mgnesium oxide - supplement Torsemide  - fluid retention Patanol - macular degeneration Senna, bisacodyl - constipation     Type of Medication Issue Identified Description of Issue Recommendation(s)  Drug Interaction(s) (clinically significant)     Duplicate Therapy     Allergy     No Medication Administration End Date     Incorrect Dose     Additional Drug Therapy Needed     Significant med changes from prior encounter (inform family/care partners about these prior to discharge). Prior to admission Entresto  changed to Irbersartan. Communicate relevant medication changes to patient/family members at discharge from CIR.     Other       Clinically significant medication issues were identified that warrant physician communication and completion of prescribed/recommended actions by midnight of the next day:   Yes,  duplicate orders:   continued the  lantus   pen 20 units daily and prescribed a new RX for lantus  [pen 22 units daily.  Name of provider notified for urgent issues identified: Rolan Pitch, PA  Provider Method of Notification:  secure chat  Pharmacist comments:   Rolan Pitch, PA to instruct patient on Lantus  (insulin  glargine) dose for discharge.  Time spent performing this drug regimen review (minutes):  15  Levorn Gaskins, [place] Clinical Pharmacist 01/15/2024  8:55 AM.

## 2024-01-18 ENCOUNTER — Other Ambulatory Visit: Payer: Self-pay | Admitting: Adult Health

## 2024-02-04 ENCOUNTER — Encounter: Attending: Physical Medicine and Rehabilitation | Admitting: Physical Medicine and Rehabilitation

## 2024-02-04 ENCOUNTER — Encounter: Payer: Self-pay | Admitting: Physical Medicine and Rehabilitation

## 2024-02-04 VITALS — BP 193/91 | HR 85 | Ht 63.5 in | Wt 129.0 lb

## 2024-02-04 DIAGNOSIS — I10 Essential (primary) hypertension: Secondary | ICD-10-CM | POA: Diagnosis present

## 2024-02-04 DIAGNOSIS — I5022 Chronic systolic (congestive) heart failure: Secondary | ICD-10-CM | POA: Diagnosis present

## 2024-02-04 DIAGNOSIS — E1169 Type 2 diabetes mellitus with other specified complication: Secondary | ICD-10-CM | POA: Diagnosis present

## 2024-02-04 DIAGNOSIS — I428 Other cardiomyopathies: Secondary | ICD-10-CM | POA: Insufficient documentation

## 2024-02-04 DIAGNOSIS — E785 Hyperlipidemia, unspecified: Secondary | ICD-10-CM | POA: Diagnosis present

## 2024-02-04 DIAGNOSIS — Z794 Long term (current) use of insulin: Secondary | ICD-10-CM

## 2024-02-04 MED ORDER — INSULIN GLARGINE 100 UNIT/ML SOLOSTAR PEN: 22.0000 [IU] | PEN_INJECTOR | Freq: Every day | SUBCUTANEOUS | 11 refills | Status: AC

## 2024-02-04 MED ORDER — TORSEMIDE 20 MG PO TABS: 20.0000 mg | ORAL_TABLET | Freq: Every day | ORAL | 3 refills | Status: AC

## 2024-02-04 MED ORDER — LANTUS SOLOSTAR 100 UNIT/ML ~~LOC~~ SOPN: 19.0000 [IU] | PEN_INJECTOR | Freq: Every morning | SUBCUTANEOUS | 3 refills | Status: AC

## 2024-02-04 MED ORDER — FREESTYLE LIBRE 3 SENSOR MISC: 1.0000 | 3 refills | Status: AC

## 2024-02-04 MED ORDER — IRBESARTAN 300 MG PO TABS: 300.0000 mg | ORAL_TABLET | Freq: Every day | ORAL | 0 refills | Status: AC

## 2024-02-04 MED ORDER — CARVEDILOL 3.125 MG PO TABS: 3.1250 mg | ORAL_TABLET | Freq: Two times a day (BID) | ORAL | 1 refills | Status: AC

## 2024-02-04 NOTE — Progress Notes (Signed)
 Subjective:    Patient ID: [name], female    DOB: December 14, 1945, 78 y.o.   MRN: [id number]  HPI [name] is an 78 year old woman who presents for HFU for cardiac debility.  1) Cardiac debility 2/2 cardiomyopathy: -ambulating well at home -she is almost finished with therapy- her therapists started coming thanksgiving  2) Type 2 diabetes: -discussed that she was on glypizide in the past -metformin  gave her diarrhea -the insulin  has controlled her blood sugars the best   Pain Inventory Average Pain 0 Pain Right Now 0 My pain is No pain  In the last 24 hours, has pain interfered with the following? General activity 0 Relation with others 0 Enjoyment of life 0 What TIME of day is your pain at its worst? morning  Sleep (in general) Good  Pain is worse with: sitting Pain improves with: no pain only stiff in morning or sitting too long Relief from Meds: none  walk without assistance ability to climb steps?  yes do you drive?  no Do you have any goals in this area?  yes  retired  [employer]  Any changes since last visit?  no  Any changes since last visit?  no    Family History  Problem Relation Age of Onset   Stroke Father    Diabetes Maternal Grandmother    Social History   Socioeconomic History   Marital status: Widowed    Spouse name: Not on file   Number of children: 2   Years of education: Not on file   Highest education level: Not on file  Occupational History   Occupation: retired  Tobacco Use   Smoking status: Former    Current packs/day: 0.00    Types: Cigarettes    Start date: 09/30/1982    Quit date: 09/29/2012    Years since quitting: 11.3   Smokeless tobacco: Never  Vaping Use   Vaping status: Never Used  Substance and Sexual Activity   Alcohol use: No   Drug use: No   Sexual activity: Not on file  Other Topics Concern   Not on file  Social History Narrative   Not on file   Social Drivers of Health   Tobacco Use: Medium Risk  (02/04/2024)   Patient History    Smoking Tobacco Use: Former    Smokeless Tobacco Use: Never    Passive Exposure: Not on Actuary Strain: Low Risk (09/25/2022)   Overall Financial Resource Strain (CARDIA)    Difficulty of Paying Living Expenses: Not very hard  Food Insecurity: No Food Insecurity (12/29/2023)   Epic    Worried About Programme Researcher, Broadcasting/film/video in the Last Year: Never true    Ran Out of Food in the Last Year: Never true  Transportation Needs: No Transportation Needs (12/29/2023)   Epic    Lack of Transportation (Medical): No    Lack of Transportation (Non-Medical): No  Physical Activity: Not on file  Stress: Not on file  Social Connections: Moderately Integrated (12/29/2023)   Social Connection and Isolation Panel    Frequency of Communication with Friends and Family: More than three times a week    Frequency of Social Gatherings with Friends and Family: Once a week    Attends Religious Services: More than 4 times per year    Active Member of [employer] or Organizations: Yes    Attends Banker Meetings: More than 4 times per year    Marital Status: Widowed  Depression (PHQ2-9): Not on file  Alcohol Screen: Low Risk (09/25/2022)   Alcohol Screen    Last Alcohol Screening Score (AUDIT): 0  Housing: Low Risk (12/29/2023)   Epic    Unable to Pay for Housing in the Last Year: No    Number of Times Moved in the Last Year: 0    Homeless in the Last Year: No  Utilities: Not At Risk (12/29/2023)   Epic    Threatened with loss of utilities: No  Health Literacy: Not on file   Past Surgical History:  Procedure Laterality Date   APPENDECTOMY     BREAST LUMPECTOMY     lt breast   CARDIAC CATHETERIZATION N/A 12/20/2015   Procedure: Left Heart Cath and Coronary Angiography;  Surgeon: Lonni Hanson, MD:: Mild to moderate, nonobstructive coronary artery disease, including 15% distal LAD stenosis, 30% ulcerated mid LCx disease, and 40% mid RCA stenosis.  Upper  normal left ventricular filling pressure (LVEDP 15 mmHg).   PACEMAKER IMPLANT N/A 12/31/2023   Procedure: PACEMAKER IMPLANT;  Surgeon: Almetta Donnice LABOR, MD;  Location[facility] INVASIVE CV LAB;  Service: Cardiovascular;  Laterality: N/A;   RIGHT HEART CATH N/A 05/08/2016   Procedure: Right Heart Cath;  Surgeon: Toribio JONELLE Fuel, MD;  Location: [facility] INVASIVE CV LAB;; RA = 3, RV = 34/5, PA = 38/12 (21), PCW = 12; Fick cardiac output/index = 4.6/2.6; PVR = 1.9 WU, Ao sat = 96%; PA sat = 58%, 62%    TEMPORARY PACEMAKER Right 12/29/2023   Procedure: TEMPORARY PACEMAKER;  Surgeon: Anner Alm ORN, MD;  Location: [facility] INVASIVE CV LAB;  Service: Cardiovascular;  Laterality: Right;   TRANSTHORACIC ECHOCARDIOGRAM  11/2015   a) 11/2015: EF 40 and 45%.  GRII DD; b) 04/2016: EF 20% with grade 2 diastolic dysfunction   TRANSTHORACIC ECHOCARDIOGRAM  10/2016   a) EF 55-60%.  Normal LV function.  Mild DD.[name]  Normal valve Fxn.; b) 09/2020: EF 50 to 55%.  Normal function.  No R WMA.  Mild LVH.  GR 2 DD with elevated LAP.-Mild LA dilation.  Moderately elevated PAP estimated RVSP 47 mmHg, and RAP 8 mmHg.   TRIGGER FINGER RELEASE Left 04/23/2017   Procedure: RELEASE TRIGGER FINGER TO LEFT FORTH FINGER;  Surgeon: Marcus Lung, MD;  Location: [facility];  Service: Plastics;  Laterality: Left;   Past Medical History:  Diagnosis Date   Breast cancer (HCC)    breast - left    Chronic diastolic CHF (congestive heart failure), NYHA class 2 (HCC)    a. cMRI 5/10: EF 38% // b. Echo 3/13/: EF 50-55, Gr 1 DD // c. Echo 8/14: EF 40, inf/inf-septal HK, Gr 1 DD, mild MR // d. Echo 5/17: EF 40-45, inf HK, Gr 1 DD, mild MR, mild LAE, reduced RVSF, mild TR, PASP 33 // e. Echo 10/17: EF 40-45, Gr 2 DD, mild MR, severe LAE; => 09/2020 EF 50-55%, GrII DD.   Coronary artery disease, non-occlusive    a. nonobs by LHC in 2010 // b. Myoview  10/17: Lg infarct apex, distal ant and lat walls, no ischemia, EF 38; int risk  // c. LHC  10/17:  dLAD 15, pLCx 30, mRCA 40, LVEDP 15   Diabetes mellitus    NICM (nonischemic cardiomyopathy) (HCC)    Ht 5' 3.5 (1.613 m)   Wt 129 lb (58.5 kg)   BMI 22.49 kg/m   Opioid Risk Score:   Fall Risk Score:  `1  Depression screen [facility] 2/9  No data to display          Review of Systems  Musculoskeletal:        Stiffness   Neurological:        Tingling in hands & feet  All other systems reviewed and are negative.      Objective:   Physical Exam Gen: no distress, normal appearing HEENT: oral mucosa pink and moist, NCAT Cardio: Reg rate Chest: normal effort, normal rate of breathing Abd: soft, non-distended Ext: no edema Psych: pleasant, normal affect Skin: intact Neuro: Alert and oriented x3     Assessment & Plan:  1) Cardiac debility -continue statin -refilled coreg  -continue therapy  2) Diabetic peripheral neuropathy: -discussed that she cannot open bottles -discussed that the neuropathy in her feet does not bother her as much -discussed vagal nerve stimulator  -Discussed Qutenza as an option for neuropathic pain control. Discussed that this is a capsaicin patch, stronger than capsaicin cream. Discussed that it is currently approved for diabetic peripheral neuropathy and post-herpetic neuralgia, but that it has also shown benefit in treating other forms of neuropathy. Provided patient with link to site to learn more about the patch: https://www.clark.biz/. Discussed that the patch would be placed in office and benefits usually last 3 months. Discussed that unintended exposure to capsaicin can cause severe irritation of eyes, mucous membranes, respiratory tract, and skin, but that Qutenza is a local treatment and does not have the systemic side effects of other nerve medications. Discussed that there may be pain, itching, erythema, and decreased sensory function associated with the application of Qutenza. Side effects usually subside within 1 week. A cold  pack of analgesic medications can help with these side effects. Blood pressure can also be increased due to pain associated with administration of the patch.  We are recommending Qutenza 8% capsaicin to treat this patient's pain. Qutenza is the first-line treatment option recommended for diabetic peripheral neuropathy by the AACE and ADA.   Qutenza is a safer option for this patient due to the following: Gabapentin use has been associated with increased risk of dementia Lyrica use has been associated with increased risk of heart failure Cymblata, Venlafaxine, and other SSRIs/SNRIs are associated with increased weight gain

## 2024-02-11 ENCOUNTER — Ambulatory Visit

## 2024-02-11 DIAGNOSIS — I10 Essential (primary) hypertension: Secondary | ICD-10-CM | POA: Diagnosis not present

## 2024-02-12 LAB — CUP PACEART REMOTE DEVICE CHECK
Battery Remaining Longevity: 137 mo
Battery Voltage: 3.19 V
Brady Statistic AP VP Percent: 9.03 %
Brady Statistic AP VS Percent: 0.02 %
Brady Statistic AS VP Percent: 90.42 %
Brady Statistic AS VS Percent: 0.53 %
Brady Statistic RA Percent Paced: 9.21 %
Brady Statistic RV Percent Paced: 99.45 %
Date Time Interrogation Session: 20251221221925
Implantable Lead Connection Status: 753985
Implantable Lead Connection Status: 753985
Implantable Lead Implant Date: 20251110
Implantable Lead Implant Date: 20251110
Implantable Lead Location: 753859
Implantable Lead Location: 753860
Implantable Lead Model: 3830
Implantable Lead Model: 5076
Implantable Pulse Generator Implant Date: 20251110
Lead Channel Impedance Value: 285 Ohm
Lead Channel Impedance Value: 380 Ohm
Lead Channel Impedance Value: 456 Ohm
Lead Channel Impedance Value: 551 Ohm
Lead Channel Pacing Threshold Amplitude: 0.5 V
Lead Channel Pacing Threshold Amplitude: 0.625 V
Lead Channel Pacing Threshold Pulse Width: 0.4 ms
Lead Channel Pacing Threshold Pulse Width: 0.4 ms
Lead Channel Sensing Intrinsic Amplitude: 11.375 mV
Lead Channel Sensing Intrinsic Amplitude: 11.375 mV
Lead Channel Sensing Intrinsic Amplitude: 2.375 mV
Lead Channel Sensing Intrinsic Amplitude: 2.375 mV
Lead Channel Setting Pacing Amplitude: 3.5 V
Lead Channel Setting Pacing Amplitude: 3.5 V
Lead Channel Setting Pacing Pulse Width: 0.4 ms
Lead Channel Setting Sensing Sensitivity: 2.8 mV
Zone Setting Status: 755011
Zone Setting Status: 755011

## 2024-02-13 NOTE — Progress Notes (Signed)
 Remote PPM Transmission

## 2024-02-15 ENCOUNTER — Ambulatory Visit: Payer: Self-pay | Admitting: Student in an Organized Health Care Education/Training Program

## 2024-04-03 ENCOUNTER — Ambulatory Visit: Admitting: Student in an Organized Health Care Education/Training Program

## 2024-05-05 ENCOUNTER — Ambulatory Visit: Admitting: Cardiology

## 2024-05-12 ENCOUNTER — Ambulatory Visit

## 2024-08-11 ENCOUNTER — Ambulatory Visit

## 2024-11-10 ENCOUNTER — Ambulatory Visit

## 2025-02-09 ENCOUNTER — Ambulatory Visit

## 2025-05-11 ENCOUNTER — Ambulatory Visit
